# Patient Record
Sex: Male | Born: 1935
Health system: Southern US, Community
[De-identification: ages and names within clinical notes are randomized; demographics above are authoritative.]

## PROBLEM LIST (undated history)

## (undated) DIAGNOSIS — Z950 Presence of cardiac pacemaker: Secondary | ICD-10-CM

## (undated) DIAGNOSIS — I10 Essential (primary) hypertension: Secondary | ICD-10-CM

## (undated) DIAGNOSIS — D489 Neoplasm of uncertain behavior, unspecified: Secondary | ICD-10-CM

## (undated) DIAGNOSIS — E785 Hyperlipidemia, unspecified: Secondary | ICD-10-CM

## (undated) DIAGNOSIS — M1990 Unspecified osteoarthritis, unspecified site: Secondary | ICD-10-CM

## (undated) DIAGNOSIS — I4892 Unspecified atrial flutter: Principal | ICD-10-CM

## (undated) DIAGNOSIS — Z9289 Personal history of other medical treatment: Secondary | ICD-10-CM

## (undated) DIAGNOSIS — M353 Polymyalgia rheumatica: Secondary | ICD-10-CM

## (undated) DIAGNOSIS — I4891 Unspecified atrial fibrillation: Secondary | ICD-10-CM

## (undated) DIAGNOSIS — I5022 Chronic systolic (congestive) heart failure: Secondary | ICD-10-CM

## (undated) DIAGNOSIS — E039 Hypothyroidism, unspecified: Secondary | ICD-10-CM

## (undated) HISTORY — DX: Hyperlipidemia, unspecified: E78.5

## (undated) HISTORY — PX: CATARACT EXTRACTION, BILATERAL: SHX1313

## (undated) HISTORY — DX: Unspecified atrial fibrillation: I48.91

## (undated) HISTORY — DX: Hypothyroidism, unspecified: E03.9

## (undated) HISTORY — PX: INGUINAL HERNIA REPAIR: SUR1180

## (undated) HISTORY — PX: HAND SURGERY: SHX662

## (undated) HISTORY — DX: Personal history of other medical treatment: Z92.89

## (undated) HISTORY — PX: HEMORRHOID SURGERY: SHX153

## (undated) HISTORY — DX: Neoplasm of uncertain behavior, unspecified: D48.9

## (undated) HISTORY — PX: CHOLECYSTECTOMY: SHX55

## (undated) HISTORY — PX: TONSILLECTOMY: SUR1361

---

## 1999-12-08 ENCOUNTER — Ambulatory Visit: Admission: RE | Admit: 1999-12-08 | Discharge: 1999-12-08 | Payer: Self-pay | Admitting: Internal Medicine

## 2001-03-06 ENCOUNTER — Ambulatory Visit (HOSPITAL_COMMUNITY): Admission: RE | Admit: 2001-03-06 | Discharge: 2001-03-06 | Payer: Self-pay | Admitting: Cardiovascular Disease

## 2002-10-12 ENCOUNTER — Encounter: Payer: Self-pay | Admitting: Internal Medicine

## 2002-10-12 ENCOUNTER — Ambulatory Visit (HOSPITAL_COMMUNITY): Admission: RE | Admit: 2002-10-12 | Discharge: 2002-10-12 | Payer: Self-pay | Admitting: Internal Medicine

## 2004-03-16 ENCOUNTER — Ambulatory Visit: Payer: Self-pay | Admitting: Cardiology

## 2004-04-13 ENCOUNTER — Ambulatory Visit: Payer: Self-pay | Admitting: Cardiology

## 2004-05-13 ENCOUNTER — Ambulatory Visit: Payer: Self-pay | Admitting: *Deleted

## 2004-05-15 ENCOUNTER — Ambulatory Visit: Payer: Self-pay

## 2004-06-01 ENCOUNTER — Ambulatory Visit: Payer: Self-pay | Admitting: Internal Medicine

## 2004-06-01 ENCOUNTER — Ambulatory Visit: Payer: Self-pay

## 2004-06-02 ENCOUNTER — Ambulatory Visit: Payer: Self-pay | Admitting: Cardiology

## 2004-06-02 ENCOUNTER — Encounter: Payer: Self-pay | Admitting: Cardiology

## 2004-06-02 ENCOUNTER — Ambulatory Visit (HOSPITAL_COMMUNITY): Admission: RE | Admit: 2004-06-02 | Discharge: 2004-06-02 | Payer: Self-pay | Admitting: Cardiology

## 2004-06-09 ENCOUNTER — Ambulatory Visit: Payer: Self-pay | Admitting: Cardiology

## 2004-06-16 ENCOUNTER — Ambulatory Visit: Payer: Self-pay | Admitting: Internal Medicine

## 2004-07-07 ENCOUNTER — Ambulatory Visit: Payer: Self-pay | Admitting: Cardiovascular Disease

## 2004-08-04 ENCOUNTER — Ambulatory Visit: Payer: Self-pay | Admitting: Cardiology

## 2004-08-11 ENCOUNTER — Ambulatory Visit: Payer: Self-pay | Admitting: Internal Medicine

## 2004-08-20 ENCOUNTER — Ambulatory Visit: Payer: Self-pay | Admitting: Cardiology

## 2004-08-24 ENCOUNTER — Ambulatory Visit: Payer: Self-pay | Admitting: Internal Medicine

## 2004-08-28 ENCOUNTER — Ambulatory Visit: Payer: Self-pay | Admitting: Internal Medicine

## 2004-09-17 ENCOUNTER — Ambulatory Visit: Payer: Self-pay | Admitting: Cardiology

## 2004-10-08 ENCOUNTER — Ambulatory Visit: Payer: Self-pay | Admitting: Cardiology

## 2004-10-16 ENCOUNTER — Ambulatory Visit: Payer: Self-pay | Admitting: Cardiology

## 2004-10-29 ENCOUNTER — Ambulatory Visit: Payer: Self-pay | Admitting: Cardiology

## 2004-11-04 ENCOUNTER — Ambulatory Visit: Payer: Self-pay | Admitting: Internal Medicine

## 2004-11-13 ENCOUNTER — Ambulatory Visit: Payer: Self-pay | Admitting: Cardiology

## 2004-11-30 ENCOUNTER — Ambulatory Visit (HOSPITAL_COMMUNITY): Admission: RE | Admit: 2004-11-30 | Discharge: 2004-11-30 | Payer: Self-pay | Admitting: General Surgery

## 2005-01-12 ENCOUNTER — Ambulatory Visit: Payer: Self-pay | Admitting: Internal Medicine

## 2005-02-16 ENCOUNTER — Ambulatory Visit: Payer: Self-pay | Admitting: Internal Medicine

## 2005-02-26 ENCOUNTER — Ambulatory Visit: Payer: Self-pay | Admitting: Internal Medicine

## 2005-05-03 HISTORY — PX: ATRIAL FIBRILLATION ABLATION: SHX5732

## 2005-07-12 ENCOUNTER — Ambulatory Visit: Payer: Self-pay | Admitting: Internal Medicine

## 2006-01-06 ENCOUNTER — Ambulatory Visit: Payer: Self-pay | Admitting: Internal Medicine

## 2006-02-17 ENCOUNTER — Ambulatory Visit: Payer: Self-pay | Admitting: Internal Medicine

## 2006-07-06 ENCOUNTER — Ambulatory Visit: Payer: Self-pay | Admitting: Internal Medicine

## 2006-07-06 LAB — CONVERTED CEMR LAB
ALT: 9 units/L (ref 0–40)
LDL Cholesterol: 97 mg/dL (ref 0–99)
Potassium: 5.7 meq/L — ABNORMAL HIGH (ref 3.5–5.1)
TSH: 1.96 microintl units/mL (ref 0.35–5.50)
Total CHOL/HDL Ratio: 4.1
VLDL: 24 mg/dL (ref 0–40)

## 2006-07-19 ENCOUNTER — Ambulatory Visit: Payer: Self-pay | Admitting: Gastroenterology

## 2006-08-05 ENCOUNTER — Ambulatory Visit: Payer: Self-pay | Admitting: Gastroenterology

## 2006-08-05 LAB — HM COLONOSCOPY

## 2007-01-09 ENCOUNTER — Ambulatory Visit: Payer: Self-pay | Admitting: Internal Medicine

## 2007-01-09 LAB — CONVERTED CEMR LAB
BUN: 21 mg/dL (ref 6–23)
Calcium: 9.3 mg/dL (ref 8.4–10.5)
Creatinine, Ser: 1 mg/dL (ref 0.4–1.5)
GFR calc non Af Amer: 77 mL/min
Glucose, Bld: 92 mg/dL (ref 70–99)
Sodium: 144 meq/L (ref 135–145)

## 2007-01-13 ENCOUNTER — Encounter: Payer: Self-pay | Admitting: Internal Medicine

## 2007-01-13 DIAGNOSIS — Z9089 Acquired absence of other organs: Secondary | ICD-10-CM

## 2007-01-13 DIAGNOSIS — M353 Polymyalgia rheumatica: Secondary | ICD-10-CM

## 2007-01-13 DIAGNOSIS — E039 Hypothyroidism, unspecified: Secondary | ICD-10-CM

## 2007-01-13 DIAGNOSIS — Z9889 Other specified postprocedural states: Secondary | ICD-10-CM

## 2007-01-13 DIAGNOSIS — E785 Hyperlipidemia, unspecified: Secondary | ICD-10-CM | POA: Insufficient documentation

## 2007-01-13 HISTORY — DX: Polymyalgia rheumatica: M35.3

## 2007-02-24 ENCOUNTER — Encounter: Payer: Self-pay | Admitting: Internal Medicine

## 2007-04-05 ENCOUNTER — Ambulatory Visit: Payer: Self-pay | Admitting: Internal Medicine

## 2007-04-05 DIAGNOSIS — D485 Neoplasm of uncertain behavior of skin: Secondary | ICD-10-CM | POA: Insufficient documentation

## 2007-04-17 ENCOUNTER — Telehealth: Payer: Self-pay | Admitting: Internal Medicine

## 2007-07-10 ENCOUNTER — Ambulatory Visit: Payer: Self-pay | Admitting: Internal Medicine

## 2007-07-10 LAB — CONVERTED CEMR LAB
ALT: 10 units/L (ref 0–53)
Albumin: 4.5 g/dL (ref 3.5–5.2)
CO2: 32 meq/L (ref 19–32)
Creatinine, Ser: 1.1 mg/dL (ref 0.4–1.5)
GFR calc Af Amer: 83 mL/min
GFR calc non Af Amer: 69 mL/min
HDL: 44.4 mg/dL (ref >39.0)
Potassium: 5.3 meq/L — ABNORMAL HIGH (ref 3.5–5.1)
Sodium: 143 meq/L (ref 135–145)
TSH: 1.93 microintl units/mL (ref 0.35–5.50)
Total Bilirubin: 1.3 mg/dL — ABNORMAL HIGH (ref 0.3–1.2)
VLDL: 22 mg/dL (ref 0–40)

## 2007-07-11 ENCOUNTER — Encounter: Payer: Self-pay | Admitting: Internal Medicine

## 2008-01-03 ENCOUNTER — Ambulatory Visit: Payer: Self-pay | Admitting: Internal Medicine

## 2008-01-03 DIAGNOSIS — I480 Paroxysmal atrial fibrillation: Secondary | ICD-10-CM | POA: Insufficient documentation

## 2008-01-03 LAB — CONVERTED CEMR LAB
BUN: 17 mg/dL (ref 6–23)
Basophils Absolute: 0 10*3/uL (ref 0.0–0.1)
Basophils Relative: 0.5 % (ref 0.0–3.0)
Bilirubin, Direct: 0.2 mg/dL (ref 0.0–0.3)
Chloride: 108 meq/L (ref 96–112)
Free T4: 1 ng/dL (ref 0.6–1.6)
GFR calc Af Amer: 93 mL/min
HCT: 46.5 % (ref 39.0–52.0)
Hemoglobin: 16 g/dL (ref 13.0–17.0)
LDL Cholesterol: 116 mg/dL — ABNORMAL HIGH (ref 0–99)
Lymphocytes Relative: 13.3 % (ref 12.0–46.0)
MCHC: 34.5 g/dL (ref 30.0–36.0)
MCV: 91.6 fL (ref 78.0–100.0)
Monocytes Absolute: 0.5 10*3/uL (ref 0.1–1.0)
Monocytes Relative: 9.3 % (ref 3.0–12.0)
Platelets: 165 10*3/uL (ref 150–400)
Potassium: 5.2 meq/L — ABNORMAL HIGH (ref 3.5–5.1)
RBC: 5.07 M/uL (ref 4.22–5.81)
Total CHOL/HDL Ratio: 4.9
Total Protein: 7.8 g/dL (ref 6.0–8.3)
VLDL: 25 mg/dL (ref 0–40)
WBC: 5.7 10*3/uL (ref 4.5–10.5)

## 2008-01-05 ENCOUNTER — Encounter: Payer: Self-pay | Admitting: Internal Medicine

## 2008-02-08 ENCOUNTER — Ambulatory Visit: Payer: Self-pay | Admitting: Internal Medicine

## 2008-07-08 ENCOUNTER — Ambulatory Visit: Payer: Self-pay | Admitting: Internal Medicine

## 2008-07-09 ENCOUNTER — Ambulatory Visit: Payer: Self-pay | Admitting: Internal Medicine

## 2008-07-09 LAB — CONVERTED CEMR LAB
ALT: 9 units/L (ref 0–53)
AST: 19 units/L (ref 0–37)
Albumin: 4 g/dL (ref 3.5–5.2)
BUN: 21 mg/dL (ref 6–23)
Calcium: 9.2 mg/dL (ref 8.4–10.5)
Creatinine, Ser: 1 mg/dL (ref 0.4–1.5)
GFR calc non Af Amer: 76 mL/min
Glucose, Bld: 131 mg/dL — ABNORMAL HIGH (ref 70–99)
HDL: 36.2 mg/dL — ABNORMAL LOW (ref >39.0)
LDL Cholesterol: 86 mg/dL (ref 0–99)
Sodium: 140 meq/L (ref 135–145)
Total Bilirubin: 1.6 mg/dL — ABNORMAL HIGH (ref 0.3–1.2)
Total CHOL/HDL Ratio: 3.6
Triglycerides: 50 mg/dL (ref 0–149)

## 2008-07-13 ENCOUNTER — Encounter: Payer: Self-pay | Admitting: Internal Medicine

## 2008-07-19 ENCOUNTER — Ambulatory Visit: Payer: Self-pay | Admitting: Internal Medicine

## 2008-07-28 ENCOUNTER — Encounter: Payer: Self-pay | Admitting: Internal Medicine

## 2008-10-24 ENCOUNTER — Encounter: Payer: Self-pay | Admitting: Internal Medicine

## 2008-10-25 ENCOUNTER — Encounter: Payer: Self-pay | Admitting: Internal Medicine

## 2009-01-09 ENCOUNTER — Ambulatory Visit: Payer: Self-pay | Admitting: Internal Medicine

## 2009-01-09 LAB — CONVERTED CEMR LAB
ALT: 10 units/L (ref 0–53)
AST: 21 units/L (ref 0–37)
Alkaline Phosphatase: 56 units/L (ref 39–117)
Calcium: 9.1 mg/dL (ref 8.4–10.5)
Creatinine, Ser: 0.9 mg/dL (ref 0.4–1.5)
LDL Cholesterol: 89 mg/dL (ref 0–99)
Potassium: 5.3 meq/L — ABNORMAL HIGH (ref 3.5–5.1)
Sodium: 141 meq/L (ref 135–145)
TSH: 2.48 microintl units/mL (ref 0.35–5.50)
Total Bilirubin: 1.3 mg/dL — ABNORMAL HIGH (ref 0.3–1.2)

## 2009-03-12 ENCOUNTER — Ambulatory Visit: Payer: Self-pay | Admitting: Internal Medicine

## 2009-07-18 ENCOUNTER — Ambulatory Visit: Payer: Self-pay | Admitting: Internal Medicine

## 2009-07-22 ENCOUNTER — Ambulatory Visit: Payer: Self-pay | Admitting: Internal Medicine

## 2009-07-22 LAB — CONVERTED CEMR LAB
ALT: 11 units/L (ref 0–53)
Albumin: 4.1 g/dL (ref 3.5–5.2)
BUN: 20 mg/dL (ref 6–23)
Bilirubin, Direct: 0.3 mg/dL (ref 0.0–0.3)
CO2: 31 meq/L (ref 19–32)
Calcium: 9.3 mg/dL (ref 8.4–10.5)
Chloride: 108 meq/L (ref 96–112)
Cholesterol: 131 mg/dL (ref 0–200)
Creatinine, Ser: 1 mg/dL (ref 0.4–1.5)
LDL Cholesterol: 76 mg/dL (ref 0–99)
Potassium: 4.9 meq/L (ref 3.5–5.1)
Sodium: 141 meq/L (ref 135–145)
Total CHOL/HDL Ratio: 3
Total Protein: 7.1 g/dL (ref 6.0–8.3)
Triglycerides: 70 mg/dL (ref 0.0–149.0)
VLDL: 14 mg/dL (ref 0.0–40.0)

## 2009-08-21 ENCOUNTER — Ambulatory Visit: Payer: Self-pay | Admitting: Internal Medicine

## 2009-08-21 DIAGNOSIS — I10 Essential (primary) hypertension: Secondary | ICD-10-CM

## 2009-08-21 HISTORY — DX: Essential (primary) hypertension: I10

## 2010-01-16 ENCOUNTER — Encounter: Payer: Self-pay | Admitting: Internal Medicine

## 2010-01-16 ENCOUNTER — Ambulatory Visit: Payer: Self-pay | Admitting: Internal Medicine

## 2010-01-16 DIAGNOSIS — E291 Testicular hypofunction: Secondary | ICD-10-CM

## 2010-01-16 LAB — CONVERTED CEMR LAB
BUN: 27 mg/dL — ABNORMAL HIGH (ref 6–23)
CO2: 32 meq/L (ref 19–32)
Chloride: 104 meq/L (ref 96–112)
GFR calc non Af Amer: 73.49 mL/min (ref >60)
Potassium: 6.3 meq/L (ref 3.5–5.1)
Sodium: 142 meq/L (ref 135–145)

## 2010-01-20 ENCOUNTER — Telehealth: Payer: Self-pay | Admitting: Internal Medicine

## 2010-01-21 ENCOUNTER — Ambulatory Visit: Payer: Self-pay | Admitting: Internal Medicine

## 2010-01-26 ENCOUNTER — Telehealth (INDEPENDENT_AMBULATORY_CARE_PROVIDER_SITE_OTHER): Payer: Self-pay | Admitting: *Deleted

## 2010-01-28 ENCOUNTER — Encounter: Payer: Self-pay | Admitting: Internal Medicine

## 2010-01-30 ENCOUNTER — Ambulatory Visit: Payer: Self-pay | Admitting: Internal Medicine

## 2010-03-02 ENCOUNTER — Ambulatory Visit: Payer: Self-pay | Admitting: Internal Medicine

## 2010-03-06 ENCOUNTER — Telehealth: Payer: Self-pay | Admitting: Internal Medicine

## 2010-03-09 ENCOUNTER — Ambulatory Visit: Payer: Self-pay | Admitting: Internal Medicine

## 2010-04-08 ENCOUNTER — Ambulatory Visit: Payer: Self-pay | Admitting: Internal Medicine

## 2010-05-08 ENCOUNTER — Ambulatory Visit
Admission: RE | Admit: 2010-05-08 | Discharge: 2010-05-08 | Payer: Self-pay | Source: Home / Self Care | Attending: Internal Medicine | Admitting: Internal Medicine

## 2010-06-02 NOTE — Assessment & Plan Note (Signed)
Summary: TESTOSTERONE/MEN [name]   Nurse Visit   Allergies: No Known Drug Allergies  Medication Administration  Injection # 1:    Medication: Testosterone Cypionat 200mg  ing    Diagnosis: HYPOGONADISM (ICD-257.2)    Route: IM    Site: RUOQ gluteus    Exp Date: 10/31/2012    Lot #: [id number]    Patient tolerated injection without complications    Given by: Jarome Lamas (March 02, 2010 2:34 PM)  Orders Added: 1)  Admin of Therapeutic Inj  intramuscular or subcutaneous [96372] 2)  Testosterone Cypionat 200mg  ing [J1080]

## 2010-06-02 NOTE — Assessment & Plan Note (Signed)
Summary: 6 MTH FU  #--STC   Vital Signs:  Patient profile:   77 year old male Height:      69 inches Weight:      173 pounds BMI:     25.64 O2 Sat:      97 % on Room air Temp:     97.5 degrees F oral Pulse rate:   52 / minute BP sitting:   112 / 62  (left arm) Cuff size:   regular  Vitals Entered By: Bill Salinas CMA (July 18, 2009 3:28 PM)  O2 Flow:  Room air CC: 6 month follow up/ ab   Primary Care Provider:  Quindell Shere  CC:  6 month follow up/ ab.  History of Present Illness: Patient presents for routine follow-up: did have an intestinal virus early March: N/V/D. He treated himself with pediolyte. He remained sore for several weeks.   Cardiac -staying in rheumatology  Rheum - no flare of PMR  Current Medications (verified): 1)  Multivitamins   Tabs (Multiple Vitamin) .... Take One Tablet Daily 2)  Synthroid 50 Mcg  Tabs (Levothyroxine Sodium) .... Take One Tablet Daily 3)  Refresh Tears 0.5 %  Soln (Carboxymethylcellulose Sodium) .... Use 2-3 Drops At Bedtime 4)  Lipitor 10 Mg  Tabs (Atorvastatin Calcium) .... Take One Tablet Daily 5)  Enalapril Maleate 5 Mg  Tabs (Enalapril Maleate) .... Take One Tablet Daily 6)  Aspirin 325 Mg  Tabs (Aspirin) .... Take One Tablet Daily 7)  Glucosamine-Chondroitin-Msm [phone] Mg  Tabs (Glucosa-Chondr-Na Chondr-Msm) .... Take One Tablet Daily 8)  Viagra 100 Mg  Tabs (Sildenafil Citrate) .... 1/2 Tab As Needed  Allergies (verified): No Known Drug Allergies  Past History:  Past Medical History: Last updated: 01/03/2008 ATRIAL FIBRILLATION, HX OF (ICD-V12.59)-s/p ablation at [facility] clinic '07 NEOPLASM OF UNCERTAIN BEHAVIOR OF SKIN (ICD-238.2) Hx of POLYMYALGIA RHEUMATICA (ICD-725) HYPERLIPIDEMIA (ICD-272.4) HYPOTHYROIDISM (ICD-244.9)  Past Surgical History: Last updated: 01/03/2008 HAND SURGERY (ICD-763.1) TONSILLECTOMY, HX OF (ICD-V45.79) INGUINAL HERNIORRHAPHY, HX OF (ICD-V45.89) CHOLECYSTECTOMY, HX OF  (ICD-V45.79) Cataract extraction bilateral Summer '09  Family History: Last updated: 2007/07/22 father - deceased @ 61: Renal failure mother - deceased @ 92:emphysema, DM Neg-colon, prostate cancer; no first degree with CAD  Social History: Last updated: 07/22/2007 Lennie Hummer, BA $ MBA, Graduate degree [employer]ing [employer] married - 1958 2 girls - '59, '62; 1 son - '64; 2 grandchildren Retired Psychologist, occupational - still active with stocks End- of-life CAre: has a living will, would not want any heroic/futile care.  Risk Factors: Alcohol Use: 0 (07/22/2007) Exercise: yes (2007-07-22)  Risk Factors: Smoking Status: quit (01/13/2007)  Review of Systems  The patient denies anorexia, fever, weight loss, weight gain, chest pain, syncope, dyspnea on exertion, peripheral edema, abdominal pain, severe indigestion/heartburn, muscle weakness, suspicious skin lesions, depression, and enlarged lymph nodes.    Physical Exam  General:  WNWD white male in no distress Head:  normocephalic and atraumatic.   Eyes:  vision grossly intact, pupils equal, pupils round, corneas and lenses clear, and no injection.   Mouth:  good dentition and pharynx pink and moist.   Neck:  supple, full ROM, no thyromegaly, and no carotid bruits.   Lungs:  Normal respiratory effort, chest expands symmetrically. Lungs are clear to auscultation, no crackles or wheezes. Heart:  Normal rate and regular rhythm. S1 and S2 normal without gallop, murmur, click, rub or other extra sounds. Abdomen:  soft, non-tender, and normal bowel sounds.   Msk:  normal ROM, no joint tenderness,  no joint swelling, no joint warmth, and no joint deformities.   Pulses:  2+ radial Extremities:  No clubbing, cyanosis, edema, or deformity noted with normal full range of motion of all joints.   Neurologic:  alert & oriented X3, cranial nerves II-XII intact, strength normal in all extremities, and gait normal.   Skin:  turgor normal and color normal.    Cervical Nodes:  no anterior cervical adenopathy and no posterior cervical adenopathy.   Psych:  Oriented X3, memory intact for recent and remote, normally interactive, good eye contact, and not anxious appearing.     Impression & Recommendations:  Problem # 1:  ATRIAL FIBRILLATION, HX OF (ICD-V12.59) Holding sinus rhythm after ablation.  Problem # 2:  Hx of POLYMYALGIA RHEUMATICA (ICD-725) Remains in remission  Problem # 3:  HYPERLIPIDEMIA (ICD-272.4) Due for labs - he will return for fasting specimen. Recommendations to follow.  His updated medication list for this problem includes:    Lipitor 10 Mg Tabs (Atorvastatin calcium) .Marland Kitchen... Take one tablet daily  Addendum: lab results are excellent with LDL 76  Problem # 4:  HYPOTHYROIDISM (ICD-244.9) For follow-up lab with recommendations to follow.  His updated medication list for this problem includes:    Synthroid 50 Mcg Tabs (Levothyroxine sodium) .Marland Kitchen... Take one tablet daily  Addendum - good control   Problem # 5:  HYPOGONADISM (ICD-257.2) Patient recently seen by urology and had a testosterone level drawn. By his recollection it was 180 - a low value. Discussed hypogonadism and male wellness. He will obtain the lab result and if he is deficient we can discuss further replacement therapy.  Complete Medication List: 1)  Multivitamins Tabs (Multiple vitamin) .... Take one tablet daily 2)  Synthroid 50 Mcg Tabs (Levothyroxine sodium) .... Take one tablet daily 3)  Refresh Tears 0.5 % Soln (Carboxymethylcellulose sodium) .... Use 2-3 drops at bedtime 4)  Lipitor 10 Mg Tabs (Atorvastatin calcium) .... Take one tablet daily 5)  Enalapril Maleate 5 Mg Tabs (Enalapril maleate) .... Take one tablet daily 6)  Aspirin 325 Mg Tabs (Aspirin) .... Take one tablet daily 7)  Glucosamine-chondroitin-msm [phone] Mg Tabs (Glucosa-chondr-na chondr-msm) .... Take one tablet daily 8)  Viagra 100 Mg Tabs (Sildenafil citrate) .... 1/2 tab as  needed   Patient: Jobanny Leas Note: All result statuses are Final unless otherwise noted.  Tests: (1) Lipid Panel (LIPID)   Cholesterol               131 mg/dL                   9-811     ATP III Classification            Desirable:  < 200 mg/dL                    Borderline High:  200 - 239 mg/dL               High:  > = 240 mg/dL   Triglycerides             70.0 mg/dL                  9.1-478.2     Normal:  <150 mg/dL     Borderline High:  956 - 199 mg/dL   HDL                       21.30 mg/dL                 >  39.00   VLDL Cholesterol          14.0 mg/dL                  1.6-10.9   LDL Cholesterol           76 mg/dL                    6-04  CHO/HDL Ratio:  CHD Risk                             3                    Men          Women     1/2 Average Risk     3.4          3.3     Average Risk          5.0          4.4     2X Average Risk          9.6          7.1     3X Average Risk          15.0          11.0                           Tests: (2) Hepatic/Liver Function Panel (HEPATIC)   Total Bilirubin           1.2 mg/dL                   5.4-0.9   Direct Bilirubin          0.3 mg/dL                   8.1-1.9   Alkaline Phosphatase      58 U/L                      39-117   AST                       19 U/L                      0-37   ALT                       11 U/L                      0-53   Total Protein             7.1 g/dL                    1.4-7.8   Albumin                   4.1 g/dL                    2.9-5.6  Tests: (3) TSH (TSH)   FastTSH                   2.59 uIU/mL                 0.35-5.50  Tests: (4) BMP (METABOL)  Sodium                    141 mEq/L                   135-145   Potassium                 4.9 mEq/L                   3.5-5.1   Chloride                  108 mEq/L                   96-112   Carbon Dioxide            31 mEq/L                    19-32   Glucose                   94 mg/dL                    84-69   BUN                       20  mg/dL                    6-29   Creatinine                1.0 mg/dL                   5.2-8.4   Calcium                   9.3 mg/dL                   1.3-24.4   GFR                       76.13 mL/min                >60

## 2010-06-02 NOTE — Assessment & Plan Note (Signed)
Summary: testosterone inj-lb   Nurse Visit   Allergies: No Known Drug Allergies  Medication Administration  Injection # 1:    Medication: Testosterone Cypionat 200mg  ing    Diagnosis: HYPOGONADISM (ICD-257.2)    Route: IM    Site: LUOQ gluteus    Exp Date: 10/31/2012    Lot #: [id number]    Patient tolerated injection without complications    Given by: Lamar Sprinkles, CMA (January 30, 2010 11:31 AM)  Orders Added: 1)  Admin of patients own med IM/SQ [phone]

## 2010-06-02 NOTE — Medication Information (Signed)
Summary: Prior autho & approved for Depo-Testosterone/[employer]  Prior autho & approved for Depo-Testosterone/[employer]   Imported By: Sherian Rein 01/30/2010 15:10:16  _____________________________________________________________________  External Attachment:    Type:   Image     Comment:   External Document

## 2010-06-02 NOTE — Progress Notes (Signed)
Summary: PA-Testosterone  Phone Note Call from Patient Call back at Home Phone [phone]   Summary of Call: Patient is requesting a call back regarding rx given at last office visit.  Initial call taken by: Lamar Sprinkles, CMA,  January 20, 2010 3:58 PM  Follow-up for Phone Call        MD spoke w/pt, it was regarding PA for testosterone. Follow-up by: Lamar Sprinkles, CMA,  January 21, 2010 2:13 PM  Additional Follow-up for Phone Call Additional follow up Details #1::        PA-Testosterone called [employer], awaiting form. Dagoberto Reef  January 21, 2010 3:41 PM  Form pending completion - They need documentation of low testosterone levels..............Marland KitchenLamar Sprinkles, CMA  January 21, 2010 3:49 PM   if form is on my work area will do in AM Additional Follow-up by: Jacques Navy MD,  January 21, 2010 5:51 PM    Additional Follow-up for Phone Call Additional follow up Details #2::    done 9/25 Follow-up by: Jacques Navy MD,  January 26, 2010 8:30 AM

## 2010-06-02 NOTE — Progress Notes (Signed)
Summary: TESTOSTERONE  Phone Note Call from Patient Call back at Home Phone [phone]   Summary of Call: Pt was given testosterone injection on Monday. He was aware that a student administered the medication and was ok. He is concerned now that he did not get the entire dose or the medication "did not get in". Pt usually has more energy after the injection and says he has not felt a difference. Should he get another injection sooner?  Initial call taken by: Lamar Sprinkles, CMA,  March 06, 2010 9:35 AM  Follow-up for Phone Call        OK to come in for repeat injection  Follow-up by: Jacques Navy MD,  March 06, 2010 5:48 PM  Additional Follow-up for Phone Call Additional follow up Details #1::        Pt informed, scheduled for nurse visit today Additional Follow-up by: Lamar Sprinkles, CMA,  March 09, 2010 11:18 AM

## 2010-06-02 NOTE — Assessment & Plan Note (Signed)
Summary: PER PT 6 MTH FU  STC   Vital Signs:  Patient profile:   77 year old male Height:      69 inches Weight:      176 pounds BMI:     26.08 O2 Sat:      96 % on Room air Temp:     96.7 degrees F oral Pulse rate:   51 / minute BP sitting:   120 / 70  (left arm) Cuff size:   regular  Vitals Entered By: Bill Salinas CMA (January 16, 2010 10:21 AM)  O2 Flow:  Room air CC: 6 month follow up/ ab   Primary Care Provider:  Norins  CC:  6 month follow up/ ab.  History of Present Illness: Had a bad cold in May - moved to his chest and had wheezing. He did recover on his own.   He has been thinking about testosterone replacement and is willing to have replacement.  He has otherwise been doing well.   Preventive Screening-Counseling & Management  Alcohol-Tobacco     Smoking Status: quit     Year Quit: 1988  Current Medications (verified): 1)  Multivitamins   Tabs (Multiple Vitamin) .... Take One Tablet Daily 2)  Synthroid 50 Mcg  Tabs (Levothyroxine Sodium) .... Take One Tablet Daily 3)  Refresh Tears 0.5 %  Soln (Carboxymethylcellulose Sodium) .... Use 2-3 Drops At Bedtime 4)  Lipitor 10 Mg  Tabs (Atorvastatin Calcium) .... Take One Tablet Daily 5)  Enalapril Maleate 5 Mg  Tabs (Enalapril Maleate) .... Take One Tablet Daily 6)  Aspirin 325 Mg  Tabs (Aspirin) .... Take One Tablet Daily 7)  Glucosamine-Chondroitin-Msm [phone] Mg  Tabs (Glucosa-Chondr-Na Chondr-Msm) .... Take One Tablet Daily 8)  Viagra 100 Mg  Tabs (Sildenafil Citrate) .... 1/2 Tab As Needed  Allergies (verified): No Known Drug Allergies  Past History:  Past Medical History: Last updated: 01/03/2008 ATRIAL FIBRILLATION, HX OF (ICD-V12.59)-s/p ablation at [facility] clinic '07 NEOPLASM OF UNCERTAIN BEHAVIOR OF SKIN (ICD-238.2) Hx of POLYMYALGIA RHEUMATICA (ICD-725) HYPERLIPIDEMIA (ICD-272.4) HYPOTHYROIDISM (ICD-244.9)  Past Surgical History: Last updated: 01/03/2008 HAND SURGERY  (ICD-763.1) TONSILLECTOMY, HX OF (ICD-V45.79) INGUINAL HERNIORRHAPHY, HX OF (ICD-V45.89) CHOLECYSTECTOMY, HX OF (ICD-V45.79) Cataract extraction bilateral Summer '09 FH reviewed for relevance  Social History: Lennie Hummer, BA $ MBA, Graduate degree [employer] married - 1958 2 girls - '59, '62; 1 son - '64; 2 grandchildren Retired Psychologist, occupational - still active with stocks End- of-life [employer]re: has a living will, would not want any heroic/futile care. Wife sustained fracture proximal Humerus left -Sept '11  Review of Systems  The patient denies anorexia, fever, weight loss, weight gain, decreased hearing, chest pain, syncope, dyspnea on exertion, prolonged cough, abdominal pain, severe indigestion/heartburn, incontinence, suspicious skin lesions, difficulty walking, depression, and enlarged lymph nodes.    Physical Exam  General:  WNWD white male in no distress Head:  normocephalic and atraumatic.   Eyes:  pupils equal and pupils round.   Neck:  supple, full ROM, no thyromegaly, and no carotid bruits.   Chest Wall:  no deformities.   Lungs:  normal respiratory effort, no intercostal retractions, no accessory muscle use, normal breath sounds, no crackles, and no wheezes.   Heart:  normal rate, regular rhythm, no JVD, and no HJR.   Abdomen:  soft and non-tender.   Msk:  normal ROM, no joint tenderness, no joint swelling, no joint warmth, and no joint instability.   Pulses:  2+ radial pulse Extremities:  No clubbing,  cyanosis, edema, or deformity noted with normal full range of motion of all joints.   Neurologic:  alert & oriented X3, cranial nerves II-XII intact, strength normal in all extremities, and gait normal.   Skin:  turgor normal and color normal.   Cervical Nodes:  no anterior cervical adenopathy and no posterior cervical adenopathy.   Psych:  Oriented X3, memory intact for recent and remote, normally interactive, and good eye contact.     Impression &  Recommendations:  Problem # 1:  HYPERTENSION, BENIGN (ICD-401.1)  His updated medication list for this problem includes:    Enalapril Maleate 5 Mg Tabs (Enalapril maleate) .Marland Kitchen... Take one tablet daily  Orders: TLB-BMP (Basic Metabolic Panel-BMET) (80048-METABOL)  BP today: 120/70 Prior BP: 140/78 (08/21/2009)  Good control  Plan - routine metabolic panel  Labs Reviewed: K+: 4.9 (07/22/2009) Creat: : 1.0 (07/22/2009)   Chol: 131 (07/22/2009)   HDL: 40.60 (07/22/2009)   LDL: 76 (07/22/2009)   TG: 70.0 (07/22/2009)  Problem # 2:  ATRIAL FIBRILLATION, HX OF (ICD-V12.59)  Patient stable. Last saw Dr. Graciela Husbands in April. On exam today he had frequent premature beats vs slow a. fib. EKG confirms PACs, bradycardia without a. fib.   Orders: EKG w/ Interpretation (93000)  Problem # 3:  HYPERLIPIDEMIA (ICD-272.4)  His updated medication list for this problem includes:    Lipitor 10 Mg Tabs (Atorvastatin calcium) .Marland Kitchen... Take one tablet daily  Labs Reviewed: SGOT: 19 (07/22/2009)   SGPT: 11 (07/22/2009)   HDL:40.60 (07/22/2009), 39.60 (01/09/2009)  LDL:76 (07/22/2009), 89 (01/09/2009)  Chol:131 (07/22/2009), 140 (01/09/2009)  Trig:70.0 (07/22/2009), 57.0 (01/09/2009)  March labs with good control.  Plan - continue present medications.   Problem # 4:  HYPOTHYROIDISM (ICD-244.9)  His updated medication list for this problem includes:    Synthroid 50 Mcg Tabs (Levothyroxine sodium) .Marland Kitchen... Take one tablet daily  Labs Reviewed: TSH: 2.59 (07/22/2009)    HgBA1c: 5.6 (07/19/2008)  March labs revealed good control.   Plan follow-up lab March '12  Problem # 5:  HYPOGONADISM (ICD-257.2) Patient provided corrected last testosterone level of 212.   Plan - testosterone cypionate 400mg  IM monthly. Rx provided and he will return for injection at his convenience.   Complete Medication List: 1)  Multivitamins Tabs (Multiple vitamin) .... Take one tablet daily 2)  Synthroid 50 Mcg Tabs  (Levothyroxine sodium) .... Take one tablet daily 3)  Refresh Tears 0.5 % Soln (Carboxymethylcellulose sodium) .... Use 2-3 drops at bedtime 4)  Lipitor 10 Mg Tabs (Atorvastatin calcium) .... Take one tablet daily 5)  Enalapril Maleate 5 Mg Tabs (Enalapril maleate) .... Take one tablet daily 6)  Aspirin 325 Mg Tabs (Aspirin) .... Take one tablet daily 7)  Glucosamine-chondroitin-msm [phone] Mg Tabs (Glucosa-chondr-na chondr-msm) .... Take one tablet daily 8)  Viagra 100 Mg Tabs (Sildenafil citrate) .... 1/2 tab as needed 9)  Testosterone Cypionate 200 Mg/ml Oil (Testosterone cypionate) .... 2 ml im monthly for testosterone deficiency  Other Orders: Tdap => 65yrs IM (98119) Admin 1st Vaccine (14782) Flu Vaccine 80yrs + MEDICARE PATIENTS (N5621) Administration Flu vaccine - MCR (H0865) Prescriptions: TESTOSTERONE CYPIONATE 200 MG/ML OIL (TESTOSTERONE CYPIONATE) 2 ml IM monthly for testosterone deficiency  #10cc x 3   Entered and Authorized by:   Jacques Navy MD   Signed by:   Jacques Navy MD on 01/16/2010   Method used:   Print then Give to Patient   RxID:   [id number]    Immunizations Administered:  Tetanus Vaccine:  Vaccine Type: Tdap    Site: right deltoid    Mfr: GlaxoSmithKline    Dose: 0.5 ml    Route: IM    Given by: Ami Bullins CMA    Exp. Date: 01/22/2012    Lot #: [id number]    VIS given: 03/20/08 version given January 16, 2010.     Flu Vaccine Consent Questions     Do you have a history of severe allergic reactions to this vaccine? no    Any prior history of allergic reactions to egg and/or gelatin? no    Do you have a sensitivity to the preservative Thimersol? no    Do you have a past history of Guillan-Barre Syndrome? no    Do you currently have an acute febrile illness? no    Have you ever had a severe reaction to latex? no    Vaccine information given and explained to patient? yes    Are you currently pregnant? no    Lot  Number:[id number]   Exp Date:10/31/2010   Site Given  Left Deltoid IMlu

## 2010-06-02 NOTE — Assessment & Plan Note (Signed)
Summary: ec6/last seen in 06/jml      Allergies Added: NKDA  Primary Provider:  Norins  CC:  ec6/last seen in 2006.  Marland Kitchen  History of Present Illness: Mr. Lafontaine comes in on his own today after a hiatus of 5 years.  we had seen him in the remote past because of atrial arrhythmias. He underwent catheter ablation at the [facility] by Dr. Levin Erp he was a pulmonary vein isolation and SVC isolation. He had recurrent atrial flutter following the procedure. We undertook cardioversion here. He has had no recurrent arrhythmias.  He was at a local health fair at his church. The nurse there commented on a pulse in his heartbeat. He comes because of that. He is noted to have a slow heart rate. He notes no impairment in exercise tolerance. There is no lightheadedness syncope or palpitations. Furthermore he denies chest pain.  Current Medications (verified): 1)  Multivitamins   Tabs (Multiple Vitamin) .... Take One Tablet Daily 2)  Synthroid 50 Mcg  Tabs (Levothyroxine Sodium) .... Take One Tablet Daily 3)  Refresh Tears 0.5 %  Soln (Carboxymethylcellulose Sodium) .... Use 2-3 Drops At Bedtime 4)  Lipitor 10 Mg  Tabs (Atorvastatin Calcium) .... Take One Tablet Daily 5)  Enalapril Maleate 5 Mg  Tabs (Enalapril Maleate) .... Take One Tablet Daily 6)  Aspirin 325 Mg  Tabs (Aspirin) .... Take One Tablet Daily 7)  Glucosamine-Chondroitin-Msm [id number] Mg  Tabs (Glucosa-Chondr-Na Chondr-Msm) .... Take One Tablet Daily 8)  Viagra 100 Mg  Tabs (Sildenafil Citrate) .... 1/2 Tab As Needed  Allergies (verified): No Known Drug Allergies  Past History:  Past Medical History: Last updated: 01/03/2008 ATRIAL FIBRILLATION, HX OF (ICD-V12.59)-s/p ablation at [facility] clinic '07 NEOPLASM OF UNCERTAIN BEHAVIOR OF SKIN (ICD-238.2) Hx of POLYMYALGIA RHEUMATICA (ICD-725) HYPERLIPIDEMIA (ICD-272.4) HYPOTHYROIDISM (ICD-244.9)  Past Surgical History: Last updated: 01/03/2008 HAND SURGERY  (ICD-763.1) TONSILLECTOMY, HX OF (ICD-V45.79) INGUINAL HERNIORRHAPHY, HX OF (ICD-V45.89) CHOLECYSTECTOMY, HX OF (ICD-V45.79) Cataract extraction bilateral Summer '09  Family History: Last updated: 07/11/2007 father - deceased @ 48: Renal failure mother - deceased @ 92:emphysema, DM Neg-colon, prostate cancer; no first degree with CAD  Social History: Last updated: July 11, 2007 Lennie Hummer, BA $ MBA, Graduate degree [employer]ing [employer] married - 1958 2 girls - '59, '62; 1 son - '64; 2 grandchildren Retired Psychologist, occupational - still active with stocks End- of-life CAre: has a living will, would not want any heroic/futile care.  Vital Signs:  Patient profile:   77 year old male Height:      69 inches Weight:      174 pounds BMI:     25.79 Pulse rate:   49 / minute Pulse rhythm:   regular BP sitting:   140 / 78  (left arm) Cuff size:   regular  Vitals Entered By: Judithe Modest CMA (August 21, 2009 2:59 PM)  Physical Exam  General:  Alert and oriented elderly Caucasian male appearing his stated age of 67  in no acute distress. HEENT  normal . Neck veins were flat; carotids brisk and full without bruits. No lymphadenopathy. Back without kyphosis. Lungs clear. Heart sounds regular without murmurs or gallops. PMI nondisplaced. Abdomen soft with active bowel sounds without midline pulsation or hepatomegaly. Femoral pulses and distal pulses intact. Extremities were without clubbing cyanosis or edemaSkin warm and dry. Neurological exam grossly normal affect is engaging   EKG  Procedure date:  08/21/2009  Findings:      sinus rhythm at 49 Intervals 0.15/0.10 6.44 Axis is  8 Otherwise normal  Impression & Recommendations:  Problem # 1:  SINOATRIAL NODE DYSFUNCTION-PAUSES (ICD-427.81) the patient notes that he has had pauses recorded by the nurse. He has had no symptomatic pauses. As he had no symptoms related either to the heartbeat or cerebral perfusion, I suggest that we need to do  nothing about this at all  In addition he has sinus bradycardia with heart rate of 49. Again he has no impairment of exercise tolerance. He tells me he does 51 pushups and 51 seconds daily. (Where did  that number come from)so again I don't think any intervention is indicated His updated medication list for this problem includes:    Enalapril Maleate 5 Mg Tabs (Enalapril maleate) .Marland Kitchen... Take one tablet daily    Aspirin 325 Mg Tabs (Aspirin) .Marland Kitchen... Take one tablet daily  Problem # 2:  HYPERTENSION, BENIGN (ICD-401.1) His blood pressure is borderline elevated. I suggest that he follow up with Dr. Debby Bud about this and bring with him some record of blood pressures to see if up titration of his enalapril would be appropriate to consider His updated medication list for this problem includes:    Enalapril Maleate 5 Mg Tabs (Enalapril maleate) .Marland Kitchen... Take one tablet daily    Aspirin 325 Mg Tabs (Aspirin) .Marland Kitchen... Take one tablet daily  Other Orders: EKG w/ Interpretation (93000)  Patient Instructions: 1)  Your physician recommends that you schedule a follow-up appointment in: as needed

## 2010-06-02 NOTE — Miscellaneous (Signed)
Summary: [facility], [facility]   Imported By: Lester Valhalla 07/25/2009 09:13:25  _____________________________________________________________________  External Attachment:    Type:   Image     Comment:   External Document

## 2010-06-02 NOTE — Assessment & Plan Note (Signed)
Summary: testosterone injection-lb   Nurse Visit   Allergies: No Known Drug Allergies  Medication Administration  Injection # 1:    Medication: Testosterone Cypionat 200mg  ing    Diagnosis: HYPOGONADISM (ICD-257.2)    Route: IM    Site: LUOQ gluteus    Exp Date: 10/31/2012    Lot #: [id number]    Mfr: Claudette Laws    Patient tolerated injection without complications    Given by: Lamar Sprinkles, CMA (April 08, 2010 2:40 PM)  Orders Added: 1)  Admin of patients own med IM/SQ [phone]

## 2010-06-02 NOTE — Progress Notes (Signed)
Summary: PA-Testosterone  Phone Note From Pharmacy   Summary of Call: PA-Testosterone faxed [employer] @ [phone], awaiting approval. [name]  January 26, 2010 3:47 PM\par  PA approved 01/27/10-01/31/11, pt aware. Initial call taken by: [name],  January 28, 2010 3:04 PM

## 2010-06-02 NOTE — Assessment & Plan Note (Signed)
Summary: testosterone/SD   Nurse Visit   Allergies: No Known Drug Allergies  Medication Administration  Injection # 1:    Medication: Testosterone Cypionat 200mg  ing    Diagnosis: HYPOGONADISM (ICD-257.2)    Route: IM    Site: RUOQ gluteus    Exp Date: 10/31/2012    Lot #: [id number]    Comments: 2cc injection    Patient tolerated injection without complications    Given by: Ami Bullins CMA (March 09, 2010 2:06 PM)  Orders Added: 1)  Testosterone Cypionat 200mg  ing [J1080]

## 2010-06-03 ENCOUNTER — Ambulatory Visit: Admit: 2010-06-03 | Payer: Self-pay | Admitting: Internal Medicine

## 2010-06-03 ENCOUNTER — Ambulatory Visit (INDEPENDENT_AMBULATORY_CARE_PROVIDER_SITE_OTHER): Payer: Medicare Other

## 2010-06-03 ENCOUNTER — Encounter: Payer: Self-pay | Admitting: Internal Medicine

## 2010-06-03 DIAGNOSIS — E291 Testicular hypofunction: Secondary | ICD-10-CM

## 2010-06-04 NOTE — Assessment & Plan Note (Signed)
Summary: TESTOSTERONE / MEN  /NWS   Nurse Visit   Allergies: No Known Drug Allergies  Medication Administration  Injection # 1:    Medication: Testosterone Cypionat 200mg  ing    Diagnosis: HYPOGONADISM (ICD-257.2)    Route: IM    Site: LUOQ gluteus    Exp Date: 10/31/2012    Lot #: [id number]    Mfr: [employer]    Patient tolerated injection without complications    Given by: Lanier Prude, CMA(AAMA) (May 08, 2010 3:15 PM)  Orders Added: 1)  Testosterone Cypionat 200mg  ing [J1080] 2)  Admin of patients own med IM/SQ [[id number]]

## 2010-06-10 NOTE — Assessment & Plan Note (Signed)
Summary: Testosterone   Nurse Visit   Allergies: No Known Drug Allergies  Medication Administration  Injection # 1:    Medication: Testosterone Cypionat 200mg  ing    Diagnosis: HYPOGONADISM (ICD-257.2)    Route: IM    Site: RUOQ gluteus    Exp Date: 10/2012    Lot #: [id number]    Patient tolerated injection without complications    Given by: Lamar Sprinkles, CMA (June 05, 2010 10:26 AM)  Orders Added: 1)  Admin of patients own med IM/SQ [phone]

## 2010-06-11 DIAGNOSIS — Z0279 Encounter for issue of other medical certificate: Secondary | ICD-10-CM

## 2010-06-15 ENCOUNTER — Telehealth: Payer: Self-pay | Admitting: Internal Medicine

## 2010-06-24 NOTE — Progress Notes (Signed)
  Phone Note Refill Request Message from:  Fax from Pharmacy on June 15, 2010 4:40 PM  Refills Requested: Medication #1:  LIPITOR 10 MG  TABS take one tablet daily  Medication #2:  SYNTHROID 50 MCG  TABS take one tablet daily  Medication #3:  ENALAPRIL MALEATE 5 MG  TABS take one tablet daily Initial call taken by: Ami Bullins CMA,  June 15, 2010 4:40 PM    Prescriptions: ENALAPRIL MALEATE 5 MG  TABS (ENALAPRIL MALEATE) take one tablet daily  #30 Tablet x 2   Entered by:   Ami Bullins CMA   Authorized by:   Jacques Navy MD   Signed by:   Bill Salinas CMA on 06/15/2010   Method used:   Faxed to ...       [facility] (mail-order)       [address]       [place], [place]  [address]       Ph: [phone]       Fax: [phone]   RxID:   [id number] LIPITOR 10 MG  TABS (ATORVASTATIN CALCIUM) take one tablet daily  #30 Tablet x 2   Entered by:   Bill Salinas CMA   Authorized by:   Jacques Navy MD   Signed by:   Bill Salinas CMA on 06/15/2010   Method used:   Faxed to ...       [facility] (mail-order)       [address]       [place], [place]  [address]       Ph: [phone]       Fax: [phone]   RxID:   [id number] SYNTHROID 50 MCG  TABS (LEVOTHYROXINE SODIUM) take one tablet daily  #30 Tablet x 2   Entered by:   Bill Salinas CMA   Authorized by:   Jacques Navy MD   Signed by:   Bill Salinas CMA on 06/15/2010   Method used:   Faxed to ...       [address] [facility] (mail-order)       [address]       [place], [place]  [address]       Ph: [phone]       Fax: [phone]   RxID:   [id number]

## 2010-07-01 ENCOUNTER — Encounter: Payer: Self-pay | Admitting: Internal Medicine

## 2010-07-01 ENCOUNTER — Telehealth: Payer: Self-pay | Admitting: Internal Medicine

## 2010-07-01 ENCOUNTER — Ambulatory Visit (INDEPENDENT_AMBULATORY_CARE_PROVIDER_SITE_OTHER): Payer: Medicare Other

## 2010-07-01 DIAGNOSIS — E291 Testicular hypofunction: Secondary | ICD-10-CM

## 2010-07-09 NOTE — Progress Notes (Signed)
Summary: Cialis  Phone Note Call from Patient Call back at Home Phone [phone]   Caller: Patient Summary of Call: pt came in for testos inj. While here he req to be given sample or trial of Cialis. He states the viagra makes him feel "not quite right". I asked him specifically how it made him feel. He said  it didnt make him feel bad just not normal.  he says he has tried Cialis samples he was given by his wife's urologist and it worked without the side effects Viagra caused. Please advise Initial call taken by: Lanier Prude, [facility]),  July 01, 2010 2:16 PM  Follow-up for Phone Call        ok for cialis 10mg  # 6, refill prn Follow-up by: Jacques Navy MD,  July 01, 2010 5:22 PM  Additional Follow-up for Phone Call Additional follow up Details #1::        Pt informed  Additional Follow-up by: Lamar Sprinkles, CMA,  July 01, 2010 6:25 PM    New/Updated Medications: CIALIS 10 MG TABS (TADALAFIL)  CIALIS 10 MG TABS (TADALAFIL) 1 once daily as needed as directed Prescriptions: CIALIS 10 MG TABS (TADALAFIL) 1 once daily as needed as directed  #6 x 12   Entered by:   Lamar Sprinkles, CMA   Authorized by:   Jacques Navy MD   Signed by:   Lamar Sprinkles, CMA on 07/01/2010   Method used:   Electronically to        [facility] [address] [phone]* (retail)       [address]       [place], [place]  [address]       Ph: [phone] or [phone]       Fax: [phone]   RxID:   [id number]

## 2010-07-09 NOTE — Assessment & Plan Note (Signed)
Summary: per pt test inj  men  stc   Nurse Visit   Allergies: No Known Drug Allergies  Medication Administration  Injection # 1:    Medication: Testosterone Cypionat 200mg  ing    Diagnosis: HYPOGONADISM (ICD-257.2)    Route: IM    Site: RUOQ gluteus    Exp Date: 01/31/2013    Lot #: [id number]    Mfr: [employer]ma [employer]    Patient tolerated injection without complications    Given by: Lanier Prude, CMA(AAMA) (July 01, 2010 2:13 PM)  Orders Added: 1)  Testosterone Cypionat 200mg  ing [J1080] 2)  Admin of patients own med IM/SQ [[id number]]

## 2010-07-15 ENCOUNTER — Encounter: Payer: Self-pay | Admitting: Internal Medicine

## 2010-07-15 ENCOUNTER — Ambulatory Visit (INDEPENDENT_AMBULATORY_CARE_PROVIDER_SITE_OTHER): Payer: Medicare Other | Admitting: Internal Medicine

## 2010-07-15 ENCOUNTER — Other Ambulatory Visit: Payer: Self-pay | Admitting: Internal Medicine

## 2010-07-15 ENCOUNTER — Other Ambulatory Visit: Payer: Medicare Other

## 2010-07-15 DIAGNOSIS — E785 Hyperlipidemia, unspecified: Secondary | ICD-10-CM

## 2010-07-15 DIAGNOSIS — E039 Hypothyroidism, unspecified: Secondary | ICD-10-CM

## 2010-07-15 DIAGNOSIS — I10 Essential (primary) hypertension: Secondary | ICD-10-CM

## 2010-07-15 DIAGNOSIS — M353 Polymyalgia rheumatica: Secondary | ICD-10-CM

## 2010-07-15 DIAGNOSIS — E291 Testicular hypofunction: Secondary | ICD-10-CM

## 2010-07-15 LAB — BASIC METABOLIC PANEL
BUN: 21 mg/dL (ref 6–23)
CO2: 31 mEq/L (ref 19–32)
Calcium: 9.1 mg/dL (ref 8.4–10.5)
Chloride: 102 mEq/L (ref 96–112)
Creatinine, Ser: 1.2 mg/dL (ref 0.4–1.5)

## 2010-07-15 LAB — HEPATIC FUNCTION PANEL
ALT: 9 U/L (ref 0–53)
AST: 23 U/L (ref 0–37)
Bilirubin, Direct: 0.2 mg/dL (ref 0.0–0.3)

## 2010-07-15 LAB — LIPID PANEL
Cholesterol: 137 mg/dL (ref 0–200)
HDL: 37.3 mg/dL — ABNORMAL LOW (ref >39.00)
LDL Cholesterol: 85 mg/dL (ref 0–99)

## 2010-07-15 LAB — TSH: TSH: 1.79 u[IU]/mL (ref 0.35–5.50)

## 2010-07-17 ENCOUNTER — Ambulatory Visit: Payer: Self-pay | Admitting: Internal Medicine

## 2010-07-21 NOTE — Assessment & Plan Note (Signed)
Summary: 6 MO FU/LB 2:50PM APPT   Vital Signs:  Patient profile:   77 year old male Height:      69 inches Weight:      178 pounds BMI:     26.38 O2 Sat:      96 % on Room air Temp:     97.7 degrees F oral Pulse rate:   54 / minute BP sitting:   142 / 78  (left arm) Cuff size:   regular  Vitals Entered By: Bill Salinas CMA (July 15, 2010 10:38 AM)  O2 Flow:  Room air CC: 6 month follow up, pt states doing well/ ab   Primary Care Provider:  Nilo Fallin  CC:  6 month follow up and pt states doing well/ ab.  History of Present Illness: Mr. Route he is feeling well. He is entering into a push up contest with a woman in rotary. He is feeling generally well. He has had no change in medications. He did try Cialis which was very pricey. He does have some tenderness still from his last testosterone injection 2/29.  He is otherwise doing very well.   Current Medications (verified): 1)  Multivitamins   Tabs (Multiple Vitamin) .... Take One Tablet Daily 2)  Synthroid 50 Mcg  Tabs (Levothyroxine Sodium) .... Take One Tablet Daily 3)  Refresh Tears 0.5 %  Soln (Carboxymethylcellulose Sodium) .... Use 2-3 Drops At Bedtime 4)  Lipitor 10 Mg  Tabs (Atorvastatin Calcium) .... Take One Tablet Daily 5)  Enalapril Maleate 5 Mg  Tabs (Enalapril Maleate) .... Take One Tablet Daily 6)  Aspirin 325 Mg  Tabs (Aspirin) .... Take One Tablet Daily 7)  Glucosamine-Chondroitin-Msm [phone] Mg  Tabs (Glucosa-Chondr-Na Chondr-Msm) .... Take One Tablet Daily 8)  Cialis 10 Mg Tabs (Tadalafil) .Marland Kitchen.. 1 Once Daily As Needed As Directed 9)  Testosterone Cypionate 200 Mg/ml Oil (Testosterone Cypionate) .... 2 Ml Im Monthly For Testosterone Deficiency  Allergies (verified): No Known Drug Allergies  Past History:  Past Medical History: Last updated: 01/03/2008 ATRIAL FIBRILLATION, HX OF (ICD-V12.59)-s/p ablation at [facility] clinic '07 NEOPLASM OF UNCERTAIN BEHAVIOR OF SKIN (ICD-238.2) Hx of POLYMYALGIA  RHEUMATICA (ICD-725) HYPERLIPIDEMIA (ICD-272.4) HYPOTHYROIDISM (ICD-244.9)  Past Surgical History: Last updated: 01/03/2008 HAND SURGERY (ICD-763.1) TONSILLECTOMY, HX OF (ICD-V45.79) INGUINAL HERNIORRHAPHY, HX OF (ICD-V45.89) CHOLECYSTECTOMY, HX OF (ICD-V45.79) Cataract extraction bilateral Summer '09 FH reviewed for relevance, SH/Risk Factors reviewed for relevance  Review of Systems  The patient denies anorexia, fever, weight loss, weight gain, decreased hearing, chest pain, syncope, dyspnea on exertion, prolonged cough, hemoptysis, abdominal pain, severe indigestion/heartburn, incontinence, muscle weakness, transient blindness, depression, abnormal bleeding, and angioedema.    Physical Exam  General:  Well-developed,well-nourished,in no acute distress; alert,appropriate and cooperative throughout examination Head:  normocephalic, atraumatic, and no abnormalities observed.   Eyes:  vision grossly intact, pupils equal, pupils round, corneas and lenses clear, and no injection.   Neck:  supple, full ROM, no masses, no thyromegaly, and no carotid bruits.   Chest Wall:  no deformities.   Lungs:  Normal respiratory effort, chest expands symmetrically. Lungs are clear to auscultation, no crackles or wheezes. Heart:  normal rate, regular rhythm, no gallop, and no JVD.   Abdomen:  soft, non-tender, and normal bowel sounds.   Msk:  normal ROM, no joint tenderness, no joint swelling, and no joint warmth.   Pulses:  2+ radial Extremities:  No clubbing, cyanosis, edema, or deformity noted with normal full range of motion of all joints.   Neurologic:  alert & oriented X3, cranial nerves II-XII intact, strength normal in all extremities, and gait normal.   Skin:  turgor normal, color normal, no rashes, no ecchymoses, and no ulcerations.   Cervical Nodes:  no anterior cervical adenopathy and no posterior cervical adenopathy.   Psych:  Oriented X3, memory intact for recent and remote, normally  interactive, and good eye contact.     Impression & Recommendations:  Problem # 1:  HYPOGONADISM (ICD-257.2)  Orders: TLB-Testosterone, Total (84403-TESTO)  Testosterone level in normal range at mid -point of injection schedule.  Plan - continue present replacement dosing  Problem # 2:  HYPERTENSION, BENIGN (ICD-401.1)  His updated medication list for this problem includes:    Enalapril Maleate 5 Mg Tabs (Enalapril maleate) .Marland Kitchen... Take one tablet daily  Orders: TLB-BMP (Basic Metabolic Panel-BMET) (80048-METABOL)  BP today: 142/78 Prior BP: 120/70 (01/16/2010)  BP mildly elevated today. Labs OK.  Plan - continue present meds  Problem # 3:  HYPERLIPIDEMIA (ICD-272.4)  His updated medication list for this problem includes:    Lipitor 10 Mg Tabs (Atorvastatin calcium) .Marland Kitchen... Take one tablet daily  Orders: TLB-Lipid Panel (80061-LIPID) TLB-Hepatic/Liver Function Pnl (80076-HEPATIC)  ASddednum- lab reveals excellent control with normal liver functions.  Plan - continue present medication  Problem # 4:  HYPOTHYROIDISM (ICD-244.9)  His updated medication list for this problem includes:    Synthroid 50 Mcg Tabs (Levothyroxine sodium) .Marland Kitchen... Take one tablet daily  Orders: TLB-TSH (Thyroid Stimulating Hormone) (84443-TSH)  Addendum - thyroid labs are normal  Problem # 5:  Hx of POLYMYALGIA RHEUMATICA (ICD-725)  Orders: TLB-Sedimentation Rate (ESR) (85652-ESR)  ESR is normal range - no indication of recurrence.  Complete Medication List: 1)  Multivitamins Tabs (Multiple vitamin) .... Take one tablet daily 2)  Synthroid 50 Mcg Tabs (Levothyroxine sodium) .... Take one tablet daily 3)  Refresh Tears 0.5 % Soln (Carboxymethylcellulose sodium) .... Use 2-3 drops at bedtime 4)  Lipitor 10 Mg Tabs (Atorvastatin calcium) .... Take one tablet daily 5)  Enalapril Maleate 5 Mg Tabs (Enalapril maleate) .... Take one tablet daily 6)  Aspirin 325 Mg Tabs (Aspirin) .... Take one  tablet daily 7)  Glucosamine-chondroitin-msm [phone] Mg Tabs (Glucosa-chondr-na chondr-msm) .... Take one tablet daily 8)  Cialis 10 Mg Tabs (Tadalafil) .Marland Kitchen.. 1 once daily as needed as directed 9)  Testosterone Cypionate 200 Mg/ml Oil (Testosterone cypionate) .... 2 ml im monthly for testosterone deficiency  Patient: [name] Note: All result statuses are Final unless otherwise noted.  Tests: (1) Testosterone, Total (TESTO)   Testosterone              358.33 ng/dL                981.19-147.82  Tests: (2) BMP (METABOL)   Sodium                    138 mEq/L                   135-145   Potassium            [H]  5.9 mEq/L                   3.5-5.1   Chloride                  102 mEq/L                   96-112   Carbon Dioxide  31 mEq/L                    19-32   Glucose                   81 mg/dL                    59-56   BUN                       21 mg/dL                    3-87   Creatinine                1.2 mg/dL                   5.6-4.3   Calcium                   9.1 mg/dL                   3.2-95.1   GFR                       60.37 mL/min                >60.00  Tests: (3) Lipid Panel (LIPID)   Cholesterol               137 mg/dL                   8-841     ATP III Classification            Desirable:  < 200 mg/dL                    Borderline High:  200 - 239 mg/dL               High:  > = 240 mg/dL   Triglycerides             74.0 mg/dL                  6.6-063.0     Normal:  <150 mg/dL     Borderline High:  160 - 199 mg/dL   HDL                  [L]  10.93 mg/dL                 >23.55   VLDL Cholesterol          14.8 mg/dL                  7.3-22.0   LDL Cholesterol           85 mg/dL                    2-54  CHO/HDL Ratio:  CHD Risk                             4                    Men          Women     1/2 Average Risk     3.4          3.3  Average Risk          5.0          4.4     2X Average Risk          9.6          7.1     3X Average Risk           15.0          11.0                           Tests: (4) Hepatic/Liver Function Panel (HEPATIC)   Total Bilirubin           1.0 mg/dL                   1.6-1.0   Direct Bilirubin          0.2 mg/dL                   9.6-0.4   Alkaline Phosphatase      53 U/L                      39-117   AST                       23 U/L                      0-37   ALT                       9 U/L                       0-53   Total Protein             6.6 g/dL                    5.4-0.9   Albumin                   4.2 g/dL                    8.1-1.9  Tests: (5) TSH (TSH)   FastTSH                   1.79 uIU/mL                 0.35-5.50  Tests: (6) Sed Rate (ESR)   Sed Rate                  8 mm/hr                     0-22  Orders Added: 1)  TLB-Testosterone, Total [84403-TESTO] 2)  TLB-BMP (Basic Metabolic Panel-BMET) [80048-METABOL] 3)  TLB-Lipid Panel [80061-LIPID] 4)  TLB-Hepatic/Liver Function Pnl [80076-HEPATIC] 5)  TLB-TSH (Thyroid Stimulating Hormone) [84443-TSH] 6)  TLB-Sedimentation Rate (ESR) [85652-ESR] 7)  Est. Patient Level IV [14782]

## 2010-07-30 ENCOUNTER — Ambulatory Visit (INDEPENDENT_AMBULATORY_CARE_PROVIDER_SITE_OTHER): Payer: Medicare Other | Admitting: *Deleted

## 2010-07-30 DIAGNOSIS — E291 Testicular hypofunction: Secondary | ICD-10-CM

## 2010-07-30 MED ORDER — TESTOSTERONE CYPIONATE 200 MG/ML IM SOLN
400.0000 mg | Freq: Once | INTRAMUSCULAR | Status: AC
  Administered 2010-07-30: 400 mg via INTRAMUSCULAR

## 2010-07-30 NOTE — Progress Notes (Signed)
Testosterone inj given

## 2010-08-26 ENCOUNTER — Ambulatory Visit (INDEPENDENT_AMBULATORY_CARE_PROVIDER_SITE_OTHER): Payer: Medicare Other | Admitting: *Deleted

## 2010-08-26 DIAGNOSIS — E291 Testicular hypofunction: Secondary | ICD-10-CM

## 2010-08-26 MED ORDER — TESTOSTERONE CYPIONATE 200 MG/ML IM SOLN
200.0000 mg | INTRAMUSCULAR | Status: DC
  Administered 2010-08-26: 400 mg via INTRAMUSCULAR

## 2010-09-15 NOTE — Assessment & Plan Note (Signed)
[facility]                           PRIMARY CARE OFFICE NOTE   JAMONE, GARRIDO                       MRN:          [id number]  DATE:01/09/2007                            DOB:          1926-04-02    ADDENDUM:  The patient did have a thorough skin exam.  He was noted to  have a macular papular lesion on his low back that was tanned in the  majority, but had a small area that was raised and dark.   The patient had a tanned rounded lesion above the right brow, which on  exam with a hand-held lens shows microvasculature.   The patient does need to return for a shave biopsy of the lesion on his  back.  He will be referred to the skin surgery center for evaluation of  a possible basal cell carcinoma at his right eyebrow, which would need  surgical excision.     Rosalyn Gess Norins, MD  Electronically Signed    MEN/MedQ  DD: 01/09/2007  DT: 01/10/2007  Job #: [id number]   cc:   Tarry Kos

## 2010-09-15 NOTE — Assessment & Plan Note (Signed)
[facility]                           PRIMARY CARE OFFICE NOTE   QUENTIN, SHOREY                       MRN:          [id number]  DATE:01/09/2007                            DOB:          [birth year]    Mr. Honeyman is a 77 year old gentleman last seen in the office July 06, 2006, for a followup visit.  In the interval he has done well.  He did  have a viral upper respiratory infection and had some rumbling in his  ears.  He is also concerned about potential skin lesions.   PAST MEDICAL HISTORY:  Is well documented in previous chart notes.  Please see January 06, 2006.   CURRENT MEDICATIONS:  1. Refresh Tears daily.  2. Multivitamin daily.  3. Synthroid 50 mcg daily.  4. Lipitor 10 mg daily.  5. Enalapril 5 mg daily.  6. Aspirin 325 mg daily.  7. Glucosamine daily.  8. Viagra p.r.n.   REVIEW OF SYSTEMS:  Negative for any constitutional symptoms.  He has  had an eye exam in the last 12 months and does have early cataracts.  The patient has had dental work with an implant performed of his right  mandible for a premolar.  The patient has some improved physical  endurance by his report.  He has no cardiovascular complaints otherwise,  no respiratory or GI complaints.  The patient does have mild daytime  frequency and nocturia times 1 to 2.   EXAMINATION:  Temperature was 97.4, blood pressure 138/68, pulse 44,  weight 180.  GENERAL APPEARANCE:  A well-nourished, well-developed gentleman looking  younger than his stated chronologic age.  HEENT EXAM:  Normocephalic, atraumatic, EACs and TMs were normal.  Oropharynx with native dentition in good repair.  He does have a place  where an implant has been done but there is no obvious abnormality or  disease.  Posterior pharynx was clear.  Conjunctiva and sclera was  clear.  Pupils equal, round, reactive to light and accommodation.  Handheld instrument exam revealed normal disks and disk margins.  NECK:  Supple without thyromegaly.  NODES:  No adenopathy was noted in the cervical or supraclavicular  regions.  CHEST:  No CVA tenderness.  LUNGS:  Clear to auscultation and percussion.  CARDIOVASCULAR:  Radial pulses 2+, no JVD or carotid bruits.  He had a  quiet precordium with a regular rate and rhythm without murmurs, rubs or  gallops.  ABDOMEN:  Soft, no guarding, no rebound, no organosplenomegaly was  appreciated.  GENITALIA:  Normal male, bilaterally distended testicles without masses.  RECTAL EXAM:  Normal sphincter tone was noted.  Prostate was smooth and  normal in size and contour.  EXTREMITIES:  Without clubbing, cyanosis, edema, no deformities were  noted.  NEUROLOGIC EXAM:  Nonfocal.   The patient is due for some followup laboratory to include basic  metabolic panel, TSH, PSA.  Will defer getting a lipid panel, with the  last study being March of 2008 and it was normal.   ASSESSMENT AND PLAN:  1. Lipids.  Patient has been well controlled with the  last LDL being      97.  He will continue his present medications.  2. Hypertension.  The patient's blood pressure is adequately      controlled on his present medications.  3. Cardiovascular.  Patient with a history of atrial fibrillation      status post ablation procedure at the [facility]      and he is doing well with a regular rate and rhythm.  4. PMR.  The patient is stable with no recurrence.  5. Hypothyroid disease.  Laboratory is ordered and pending.  Will      change medications as indicated.   In summary, a very pleasant gentleman who is seen as medically stable at  this time.  He is asked to return to see me in 6 months for routine  followup.   ADDENDUM:  The patient did have a thorough skin exam.  He was noted to  have a macular papular lesion on his low back that was tanned in the  majority, but had a small area that was raised and dark.   The patient had a tanned rounded lesion above  the right brow, which on  exam with a hand-held lens shows microvasculature.   The patient does need to return for a shave biopsy of the lesion on his  back.  He will be referred to the skin surgery center for evaluation of  a possible basal cell carcinoma at his right eyebrow, which would need  surgical excision.     Rosalyn Gess Norins, MD  Electronically Signed    MEN/MedQ  DD: 01/09/2007  DT: 01/10/2007  Job #: [id number]   cc:   Tarry Kos

## 2010-09-18 NOTE — Op Note (Signed)
   [name], [name]                          ACCOUNT NO.:  [id number]   MEDICAL RECORD NO.:  [id number]                   PATIENT TYPE:  OIB   LOCATION:  2872                                 FACILITY:  [facility]   PHYSICIAN:  Duke Salvia, M.D.               DATE OF BIRTH:  Mar 03, 1926   DATE OF PROCEDURE:  10/12/2002  DATE OF DISCHARGE:                                 OPERATIVE REPORT   PREOPERATIVE DIAGNOSIS:  Atrial fibrillation.   POSTOPERATIVE DIAGNOSIS:  Sinus rhythm.   PROCEDURE:  DC cardioversion.   DESCRIPTION OF PROCEDURE:  The patient was submitted to general anesthesia  under the care of Sheldon Silvan, M.D.  He received 200 mg of IV Pentothal.   A 200 joule shock delivered with a biphasic waveform successfully converted  the patient from atrial fibrillation to sinus rhythm.   The patient tolerated the procedure without apparent complications. He is  moving minimally at this time.                                                Duke Salvia, M.D.    SCK/MEDQ  D:  10/12/2002  T:  10/13/2002  Job:  [id number]

## 2010-09-18 NOTE — Op Note (Signed)
[name], [name]                ACCOUNT NO.:  [id number]   MEDICAL RECORD NO.:  [id number]          PATIENT TYPE:  AMB   LOCATION:  ENDO                         FACILITY:  [facility]   PHYSICIAN:  Olga Millers, M.D. LHCDATE OF BIRTH:  1926-01-16   DATE OF PROCEDURE:  06/02/2004  DATE OF DISCHARGE:                                 OPERATIVE REPORT   PROCEDURE:  Cardioversion of atrial flutter.   The patient did have a transesophageal echocardiogram prior to the procedure  that revealed no left atrial appendage or thrombus. He was subsequently  sedated by anesthesia with Pentothal 75 mg intravenously. Synchronized  cardioversion with 120 joules (biphasic) resulted in a greater than 7 second  pause, followed marked sinus bradycardia with a heart rate in the 30s. His  heart rate then improved to the 40s and eventually to the 50s. His sinus  bradycardia was in the 95 to 100 range, and he was asymptomatic. There were  no immediate complications. We will continue the Coumadin. I did discuss the  patient with Dr. Graciela Husbands, who is the patient's physician. We will discontinue  his Cardizem. He will see Dr. Graciela Husbands back in the office in the next two  weeks.      BC/MEDQ  D:  06/02/2004  T:  06/02/2004  Job:  [id number]

## 2010-09-18 NOTE — Assessment & Plan Note (Signed)
[facility]                           PRIMARY CARE OFFICE NOTE   ZAVION, SLEIGHT                       MRN:          [id number]  DATE:07/06/2006                            DOB:          08/23/1925    [name] is a 77 year old gentleman who is followed for history of  atrial fibrillation status post ablation procedures, hyperlipidemia,  hyperthyroid disease, PMR presents for follow up evaluation and exam.  He was last seen January 06, 2006, please that complete dictation.   INTERVAL HISTORY:  The patient fortunately is fairing very well in the  main.  He did have a episode of dizziness with eustachian tube  dysfunction, which now has resolved.  He has had no cardiac complaints  and his heart rhythm has been stable as far as he knows.  The patient is  due for follow up colonoscopy based on his last study July 24, 2003  where he had tubular adenomatous polyp.  This was scheduled for the  patient August 05, 2006 with a preoperative visit March 17.  He is aware  of these appointments.   CURRENT MEDICATIONS:  1. Refresh tears.  2. Multivitamin.  3. Synthroid 50 mcg daily.  4. Lipitor 10 mg daily.  5. Enalapril 5 mg daily.  6. Glucosamine and Chondroitin with good results.   REVIEW OF SYSTEMS:  Negative for any constitution, cardiovascular,  respiratory problems.   LIMITED EXAM:  Temperature was 97.7, blood pressure 117/70, pulse 50,  weight 184.  GENERAL APPEARANCE:  A well-nourished, well-developed gentleman in no  acute distress.  CHEST:  Clear with no rales, wheezes or rhonchi.  CARDIOVASCULAR:  Have 2+ radial pulses with no jugular venous distension  or carotid bruits.  He had a regular rate and rhythm.  No further exam  conducted.   Laboratory ordered and pending.  Includes, TSH, lipid panel, liver  functions.   ASSESSMENT/PLAN:  1. Cardiovascular.  The patient stable with stable rhythm.  2. Hyperlipidemia.  The patient has  laboratory ordered and pending.      He will be notified by a phone tree of results.  Of note, he has      been well controlled with last LDL being 91, September 6 of 2007.  3. Rheum.  The patients PMR is quiescent.  His joint pain and      stiffness is markedly improved with Glucosamine.   In summary, the patient with medical problems as outlined above.  He  does seem stable and doing well at this time.  He will be notified by a  phone tree of his lab results.  He will return to see me in 6 months for  a complete physical exam.     Rosalyn Gess. Norins, MD  Electronically Signed    MEN/MedQ  DD: 07/07/2006  DT: 07/07/2006  Job #: [id number]   cc:   Tarry Kos

## 2010-09-18 NOTE — Op Note (Signed)
[name], [name]                ACCOUNT NO.:  [id number]   MEDICAL RECORD NO.:  [id number]          PATIENT TYPE:  AMB   LOCATION:  DAY                          FACILITY:  [facility]   PHYSICIAN:  Angelia Mould. Derrell Lolling, M.D.DATE OF BIRTH:  February 01, 1926   DATE OF PROCEDURE:  11/30/2004  DATE OF DISCHARGE:                                 OPERATIVE REPORT   PREOPERATIVE DIAGNOSIS:  Left inguinal hernia.   POSTOPERATIVE DIAGNOSIS:  Left inguinal hernia.   OPERATION PERFORMED:  Repair left inguinal hernia with mesh Armanda Heritage  repair).   SURGEON:  Angelia Mould. Derrell Lolling, M.D.   OPERATIVE INDICATIONS:  This is a 77 year old white man who has had a hernia  on the left side for some time, it is not clear but at least for several  months and may be longer. It has been getting bigger and is uncomfortable.  On exam, he has a large left inguinal hernia that is completely reducible  and does not extend into the scrotum. I do not feel any hernias elsewhere.  He is brought to the operating room electively.   OPERATIVE TECHNIQUE:  The patient was placed supine on the operating table.  He was monitored and sedated by the anesthesia department. Ultimately he had  to be intubated because of abdominal muscle tone and high abdominal  pressure. The left groin was prepped and draped in a sterile fashion. The  patient was identified. Intravenous antibiotics were given. 1% Xylocaine  with epinephrine was used as a local infiltration anesthetic. An oblique  incision was made in the left groin. Dissection was carried down through the  subcutaneous tissue exposing the aponeurosis of the external oblique. The  external oblique was incised in the direction of its fibers and dissected  off of the underlying structures. Self-retaining retractors were placed. I  found that he had a very a large direct inguinal hernia and large lipomas  associated with the cord making the anatomy somewhat difficult to dissect  out.  Nevertheless, we did encircle the cord structures with a Penrose drain.  We did dissect the large direct hernia sac away from the cord structures,  found that it contained a sliding component of what looked like colon. This  was dissected away from the cord and away from the lipomas of the cord. The  lipomas of the cord were individually dissected away and then clamped,  divided, removed and ligated with 2-0 Vicryl ties. I found that he had an  extremely distorted inferior epigastric artery. I very carefully dissected  this away from the other structures making sure that it was separate from  the vas deferens and the testicular vessels. I identified the inferior  epigastric artery coming out behind the rectus muscle extending down toward  the femoral triangle. This had to be a clamped proximally and distally,  divided and ligated with 2-0 silk ties.   Once I had all the lipomas debrided, I then reduced the direct hernia sac  and oversewed it with imbricating suture of 2-0 Vicryl to hold it in place.  I then inspected the cord structures and  I could not find any evidence of an  indirect hernia. The floor of the inguinal canal was then repaired and  reinforced with a 3-inch x 6-inch piece of polypropylene mesh. This was  trimmed at the corners to fit the wound but most of the mesh was used. The  mesh was sutured in place with a interrupted and running sutures of 2-0  Prolene. The mesh was sutured so as to generously overlap the fascia at the  pubic tubercle, along the inguinal ligament inferiorly. Medially and  superomedially, we used interrupted mattress sutures of 2-0 Prolene.  Superiorly and superolaterally we used a running suture of 2-0 Prolene. The  mesh was incised laterally so as to wrap around the cord structures of the  internal ring. The tails of the mesh were overlapped laterally and the  suture lines completed. We placed two further sutures of 2-0 Prolene, one  medially and one  laterally to snug up the mesh around the cord structures.  This provided a very secure repair both medial and lateral to the cord  structures but allowed an adequate opening for the cord structures. The  wound was irrigated with saline. Hemostasis was excellent. The external  oblique was closed with running suture of 2-0 Vicryl placing the cord  structures deep to the external oblique. Scarpa's fascia was closed with 3-0  Vicryl suture and the skin was closed with a running subcuticular suture of  4-0 Monocryl and Steri-Strips. Clean bandages were placed and the patient  taken to the recovery in stable condition. Estimated blood loss was about 15-  20 mL. Complications none. Sponge, needle and instrument counts were  correct.       HMI/MEDQ  D:  11/30/2004  T:  12/01/2004  Job:  [id number]   cc:   Rosalyn Gess. Norins, M.D. [facility]   Duke Salvia, M.D.

## 2010-09-18 NOTE — Assessment & Plan Note (Signed)
[facility]                             PRIMARY CARE OFFICE NOTE   [name], [name]                       MRN:          [id number]  DATE:01/06/2006                            DOB:          1925-05-10    [name] is a 77 year old gentleman who is followed for a history of  atrial fibrillation, status post ablation procedure, currently stable and no  longer followed closely by EP.  History of hyperlipidemia, hypothyroid  disease and PMR.  He was last seen July 12, 2005.  In the interval he has  done extremely well with no major medical problems and feeling well.   PAST MEDICAL HISTORY:  Surgical:  1. Correction of strabismus.  2. Cholecystectomy.  3. Bilateral inguinal herniorrhaphy.  4. Hand surgery in the past.  5. Left inguinal hernia repair redo in July 2006.  6. Remote tonsillectomy.   Medical:  The patient had the usual childhood diseases and the problems s  outlined above.   CURRENT MEDICATIONS:  1. Refresh tears daily.  2. Multivitamin daily.  3. Synthroid 50 mcg daily.  4. Lipitor 10 mg daily.  5. Enalapril 5 mg daily.  6. Aspirin 325 mg daily.  7. Glucosamine daily.  8. Viagra on an as-needed basis.   FAMILY HISTORY:  Well-documented in previous chart notes and  noncontributory.   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  He remains very active.  He exercises  on a regular basis.   CHART REVIEW:  Last colonoscopy July 24, 2003, with colon polyps that were  tubular adenomas.  He would be a candidate for follow-up in 2008.  Last EP  note in EP section August 20, 2004, revealed the patient to be stable.  Last  rest-stress Cardiolite May 2004 with an EF of 45% with no evidence of any  active ischemia.  Last 2-D echo from the [facility] August 17, 2004, with an EF of 60% plus or minus 5.   The patient had evaluation for hearing loss and  had bilateral severe high-  frequency hearing loss diagnosed by Dr. Pollyann Kennedy, natural audiometry.  Chest x-  ray from October 12, 2002, with no active disease.  Last 12-lead  electrocardiogram from October 16, 2004, which was normal.   REVIEW OF SYSTEMS:  Negative for any constitutional, cardiovascular,  respiratory, GI or GU complaints.   PHYSICAL EXAMINATION:  VITAL SIGNS:  Temperature was 97.5, blood pressure  143/77, pulse 47, weight 183.  GENERAL APPEARANCE:  A well-nourished, well-developed gentleman who looks  his stated age, in no acute distress.  HEENT:  Normocephalic, atraumatic.  EACs and TMs were unremarkable.  Oropharynx with native dentition in good repair.  No buccal or palatal  lesions were noted.  Posterior pharynx was clear.  Conjunctivae and sclerae  were clear.  PERRLA, EOMI.  Funduscopic exam with hand-held instrument  revealed normal disc margins with no vascular abnormalities.  NECK:  Supple without thyromegaly.  NODES:  No adenopathy was noted in the  cervical or supraclavicular regions.  CHEST:  No CVA tenderness.  LUNGS:  Clear to auscultation and percussion.  CARDIOVASCULAR:  2+ radial pulse, no JVD or thyroid bruits.  He had a quiet  precordium with regular rate and rhythm without murmurs, rubs or gallops.  ABDOMEN:  Soft, no guarding or rebound.  No organomsplenomegaly was noted.  GENITALIA:  Normal with bilaterally descended testicles without masses.  RECTAL:  Normal sphincter tone.  Prostate was normal in size and contour  without nodules.  EXTREMITIES:  Without clubbing, cyanosis, edema.  No deformities are noted.   Last laboratory was in March 2007 and unremarkable with an LDL of 96,  glucose of 109.  The patient is for laboratory today to include lipid panel,  AST, ALT, metabolic panel, PSA.   ASSESSMENT AND PLAN:  1. Atrial fibrillation, resolved after ablation.  2. Hyperlipidemia.  The patient has been well-controlled.  New      laboratories ordered and  pending.  Will adjust his medications as      needed.  3. Hypothyroid disease.  New laboratories ordered and pending.  Will      adjust his medication as needed.  4. Rheumatology.  The patient is very stable at this time with no      recurrent flare of his polymyalgia rheumatica.  He does exercise on a      regular basis.  5. Gastrointestinal.  Patient with a history of adenomatous polyps, for      follow-up in 2008.   In summary, the patient is medically stable at this time.  He will be  notified by phone for his lab results and will return to see me in 6 months  for follow-up.                                   Rosalyn Gess Norins, MD   MEN/MedQ  DD:  01/06/2006  DT:  01/07/2006  Job #:  [id number]   cc:   Lowell Bouton

## 2010-09-23 ENCOUNTER — Ambulatory Visit (INDEPENDENT_AMBULATORY_CARE_PROVIDER_SITE_OTHER): Payer: Medicare Other

## 2010-09-23 DIAGNOSIS — E291 Testicular hypofunction: Secondary | ICD-10-CM

## 2010-09-23 MED ORDER — TESTOSTERONE CYPIONATE 200 MG/ML IM SOLN
400.0000 mg | Freq: Once | INTRAMUSCULAR | Status: AC
  Administered 2010-09-23: 400 mg via INTRAMUSCULAR

## 2010-09-24 ENCOUNTER — Other Ambulatory Visit: Payer: Self-pay | Admitting: *Deleted

## 2010-09-24 MED ORDER — ATORVASTATIN CALCIUM 10 MG PO TABS: 10.0000 mg | ORAL_TABLET | Freq: Every day | ORAL | Status: DC

## 2010-09-24 MED ORDER — LEVOTHYROXINE SODIUM 50 MCG PO TABS: 50.0000 ug | ORAL_TABLET | Freq: Every day | ORAL | Status: DC

## 2010-09-24 MED ORDER — ENALAPRIL MALEATE 5 MG PO TABS: 5.0000 mg | ORAL_TABLET | Freq: Every day | ORAL | Status: DC

## 2010-10-09 ENCOUNTER — Other Ambulatory Visit: Payer: Self-pay | Admitting: *Deleted

## 2010-10-09 MED ORDER — SILDENAFIL CITRATE 100 MG PO TABS: 50.0000 mg | ORAL_TABLET | ORAL | Status: DC | PRN

## 2010-10-15 ENCOUNTER — Telehealth: Payer: Self-pay | Admitting: *Deleted

## 2010-10-15 MED ORDER — TADALAFIL 10 MG PO TABS: 10.0000 mg | ORAL_TABLET | ORAL | Status: DC | PRN

## 2010-10-15 NOTE — Telephone Encounter (Signed)
[employer] [facility] delivery called - pt has req RX for cialis. Viagra is on the list. If ok for cialis need details and qty for 3 mth supply.

## 2010-10-15 NOTE — Telephone Encounter (Signed)
Done

## 2010-10-15 NOTE — Telephone Encounter (Signed)
Ok to change to cialis 10mg . One as needed #18 for 3 months

## 2010-10-21 ENCOUNTER — Ambulatory Visit: Payer: Medicare Other | Admitting: *Deleted

## 2010-10-21 DIAGNOSIS — E291 Testicular hypofunction: Secondary | ICD-10-CM

## 2010-10-21 MED ORDER — TESTOSTERONE CYPIONATE 200 MG/ML IM SOLN
400.0000 mg | Freq: Once | INTRAMUSCULAR | Status: AC
  Administered 2010-10-21: 400 mg via INTRAMUSCULAR

## 2010-11-18 ENCOUNTER — Ambulatory Visit (INDEPENDENT_AMBULATORY_CARE_PROVIDER_SITE_OTHER): Payer: Medicare Other | Admitting: *Deleted

## 2010-11-18 DIAGNOSIS — E291 Testicular hypofunction: Secondary | ICD-10-CM

## 2010-11-18 MED ORDER — TESTOSTERONE CYPIONATE 200 MG/ML IM SOLN
400.0000 mg | Freq: Once | INTRAMUSCULAR | Status: AC
  Administered 2010-11-18: 400 mg via INTRAMUSCULAR

## 2010-12-16 ENCOUNTER — Ambulatory Visit: Payer: Medicare Other | Admitting: *Deleted

## 2010-12-16 DIAGNOSIS — E291 Testicular hypofunction: Secondary | ICD-10-CM

## 2010-12-16 MED ORDER — TESTOSTERONE CYPIONATE 200 MG/ML IM SOLN
200.0000 mg | Freq: Once | INTRAMUSCULAR | Status: AC
  Administered 2010-12-16: 200 mg via INTRAMUSCULAR

## 2011-01-13 ENCOUNTER — Ambulatory Visit (INDEPENDENT_AMBULATORY_CARE_PROVIDER_SITE_OTHER): Payer: Medicare Other | Admitting: *Deleted

## 2011-01-13 DIAGNOSIS — E291 Testicular hypofunction: Secondary | ICD-10-CM

## 2011-01-13 MED ORDER — TESTOSTERONE CYPIONATE 200 MG/ML IM SOLN
400.0000 mg | Freq: Once | INTRAMUSCULAR | Status: AC
  Administered 2011-01-13: 400 mg via INTRAMUSCULAR

## 2011-01-25 ENCOUNTER — Ambulatory Visit (INDEPENDENT_AMBULATORY_CARE_PROVIDER_SITE_OTHER): Payer: Medicare Other | Admitting: Internal Medicine

## 2011-01-25 ENCOUNTER — Other Ambulatory Visit (INDEPENDENT_AMBULATORY_CARE_PROVIDER_SITE_OTHER): Payer: Medicare Other

## 2011-01-25 DIAGNOSIS — E785 Hyperlipidemia, unspecified: Secondary | ICD-10-CM

## 2011-01-25 DIAGNOSIS — M353 Polymyalgia rheumatica: Secondary | ICD-10-CM

## 2011-01-25 DIAGNOSIS — E291 Testicular hypofunction: Secondary | ICD-10-CM

## 2011-01-25 DIAGNOSIS — I10 Essential (primary) hypertension: Secondary | ICD-10-CM

## 2011-01-25 LAB — HEPATIC FUNCTION PANEL
ALT: 10 U/L (ref 0–53)
AST: 21 U/L (ref 0–37)
Albumin: 4.1 g/dL (ref 3.5–5.2)
Bilirubin, Direct: 0.2 mg/dL (ref 0.0–0.3)
Total Bilirubin: 0.9 mg/dL (ref 0.3–1.2)
Total Protein: 7 g/dL (ref 6.0–8.3)

## 2011-01-25 LAB — LIPID PANEL
Cholesterol: 115 mg/dL (ref 0–200)
HDL: 39.7 mg/dL (ref >39.00)
LDL Cholesterol: 63 mg/dL (ref 0–99)
Triglycerides: 61 mg/dL (ref 0.0–149.0)

## 2011-01-25 MED ORDER — PREDNISONE 10 MG PO TABS: 10.0000 mg | ORAL_TABLET | Freq: Every day | ORAL | Status: AC

## 2011-01-25 NOTE — Progress Notes (Signed)
  Subjective:    Patient ID: [name], male    DOB: 1925/08/02, 77 y.o.   MRN: [id number]  HPI Mr. Dilger presents for medical follow-up. He has several concerns: 1. Recurrence of PMR - left neck, shoulders; he started taking prednisone and his symptoms resolved in June '12. He is exercising on a a regular basis.  2. Saturday the 15th he took 2.5 mg cialis and became pale and felt very bad. His BP did drop to 116 from usual 130's range.   I have reviewed the patient's medical history in detail and updated the computerized patient record.    Review of Systems System review is negative for any constitutional, cardiac, pulmonary, GI or neuro symptoms or complaints     Objective:   Physical Exam Vitals reviewed and normal. Reviewed outside BP readings Gen'l - WNWD whie male in no distress HEENT - C&S clear, oro-pharynx clear Chest - CTAP Cor- RRR, no murmur or gallop MSK - no tenderness to palpation shoulder girdle, thighs  Lab Results  Component Value Date   WBC 5.7 01/03/2008   HGB 16.0 01/03/2008   HCT 46.5 01/03/2008   PLT 165 01/03/2008   CHOL 115 01/25/2011   TRIG 61.0 01/25/2011   HDL 39.70 01/25/2011   ALT 10 01/25/2011   AST 21 01/25/2011   NA 138 07/15/2010   K 5.9* 07/15/2010   CL 102 07/15/2010   CREATININE 1.2 07/15/2010   BUN 21 07/15/2010   CO2 31 07/15/2010   TSH 1.79 07/15/2010   PSA 3.61 01/09/2007   HGBA1C 5.6 07/19/2008   Lab Results  Component Value Date   LDLCALC 63 01/25/2011          Assessment & Plan:

## 2011-01-26 NOTE — Assessment & Plan Note (Signed)
Tolerating testosterone replacement. He reports an incident of what sounds like hypotension following a dose of Cialis 2.5 mg. He usually has tolerated this well. He does not take any nitrate product.  Plan - cautioned about effect on BP when taking cialis but he may continue use.

## 2011-01-26 NOTE — Assessment & Plan Note (Signed)
Patient with recurrent symptoms in shoulder girdle for which he took a prednisone dosepak with resolution of symptoms. Doing well at exam with no tenderness  Plan - check ESR           Rx- prednisone burst and taper to take as needed.   Addendum - Sed rate = 6

## 2011-01-26 NOTE — Assessment & Plan Note (Signed)
BP Readings from Last 3 Encounters:  01/25/11 134/76  07/15/10 142/78  01/16/10 120/70   Adequate control on present medications

## 2011-01-26 NOTE — Assessment & Plan Note (Signed)
LDL is below goal of 100. No adverse reactions or complaints.  Plan - continue present medication

## 2011-02-10 ENCOUNTER — Ambulatory Visit: Payer: Medicare Other | Admitting: *Deleted

## 2011-02-10 DIAGNOSIS — E291 Testicular hypofunction: Secondary | ICD-10-CM

## 2011-02-10 MED ORDER — TESTOSTERONE CYPIONATE 200 MG/ML IM SOLN
200.0000 mg | INTRAMUSCULAR | Status: DC
  Administered 2011-02-10 – 2011-03-10 (×2): 200 mg via INTRAMUSCULAR

## 2011-03-10 ENCOUNTER — Ambulatory Visit (INDEPENDENT_AMBULATORY_CARE_PROVIDER_SITE_OTHER): Payer: Medicare Other | Admitting: Internal Medicine

## 2011-03-10 DIAGNOSIS — E291 Testicular hypofunction: Secondary | ICD-10-CM

## 2011-03-10 MED ORDER — TESTOSTERONE CYPIONATE 200 MG/ML IM SOLN: 200.0000 mg | INTRAMUSCULAR | Status: DC

## 2011-03-12 NOTE — Progress Notes (Signed)
  Subjective:    Patient ID: [name], male    DOB: [birth year], 77 y.o.   MRN: [id number]  HPI Nurse visit for T injection.   I have reviewed the patient's medical history in detail and updated the computerized patient record.    Review of Systems     Objective:   Physical Exam        Assessment & Plan:

## 2011-03-12 NOTE — Assessment & Plan Note (Signed)
Injection visit for testosterone replacement

## 2011-03-23 ENCOUNTER — Telehealth: Payer: Self-pay | Admitting: *Deleted

## 2011-03-23 NOTE — Telephone Encounter (Signed)
Refill request for testosterone CYP INJ 200 mg/ ML MDV Directions Inject 2 mLs intramuscularly monthly, Please Advise

## 2011-03-23 NOTE — Telephone Encounter (Signed)
Ok for refill: 10 cc multi-dose vial

## 2011-03-24 MED ORDER — TESTOSTERONE CYPIONATE 200 MG/ML IM SOLN: 400.0000 mg | INTRAMUSCULAR | Status: DC

## 2011-04-01 ENCOUNTER — Telehealth: Payer: Self-pay | Admitting: Internal Medicine

## 2011-04-01 MED ORDER — TESTOSTERONE CYPIONATE 200 MG/ML IM SOLN: 400.0000 mg | INTRAMUSCULAR | Status: DC

## 2011-04-01 NOTE — Telephone Encounter (Signed)
Faxed Rx to pharmacy for testosterone.

## 2011-04-12 ENCOUNTER — Ambulatory Visit: Payer: Medicare Other | Admitting: *Deleted

## 2011-04-12 DIAGNOSIS — E291 Testicular hypofunction: Secondary | ICD-10-CM

## 2011-04-12 MED ORDER — TESTOSTERONE CYPIONATE 100 MG/ML IM SOLN
200.0000 mg | INTRAMUSCULAR | Status: DC
  Administered 2011-04-12: 200 mg via INTRAMUSCULAR

## 2011-04-12 NOTE — Progress Notes (Signed)
  Subjective:    Patient ID: [name], male    DOB: Feb 09, 1926, 77 y.o.   MRN: [id number]  HPI Pt supplied medication    Review of Systems     Objective:   Physical Exam        Assessment & Plan:

## 2011-05-13 ENCOUNTER — Ambulatory Visit: Payer: Medicare Other | Admitting: *Deleted

## 2011-05-13 DIAGNOSIS — E291 Testicular hypofunction: Secondary | ICD-10-CM | POA: Diagnosis not present

## 2011-05-13 MED ORDER — TESTOSTERONE CYPIONATE 200 MG/ML IM SOLN
200.0000 mg | Freq: Once | INTRAMUSCULAR | Status: AC
  Administered 2011-05-13: 200 mg via INTRAMUSCULAR

## 2011-06-09 DIAGNOSIS — H905 Unspecified sensorineural hearing loss: Secondary | ICD-10-CM | POA: Diagnosis not present

## 2011-06-14 ENCOUNTER — Ambulatory Visit (INDEPENDENT_AMBULATORY_CARE_PROVIDER_SITE_OTHER): Payer: Medicare Other | Admitting: *Deleted

## 2011-06-14 DIAGNOSIS — E291 Testicular hypofunction: Secondary | ICD-10-CM | POA: Diagnosis not present

## 2011-06-14 MED ORDER — TESTOSTERONE CYPIONATE 200 MG/ML IM SOLN
400.0000 mg | INTRAMUSCULAR | Status: DC
  Administered 2011-06-14 – 2011-07-12 (×2): 400 mg via INTRAMUSCULAR

## 2011-06-17 ENCOUNTER — Encounter: Payer: Self-pay | Admitting: Gastroenterology

## 2011-07-12 ENCOUNTER — Ambulatory Visit (INDEPENDENT_AMBULATORY_CARE_PROVIDER_SITE_OTHER): Payer: Medicare Other | Admitting: *Deleted

## 2011-07-12 DIAGNOSIS — E291 Testicular hypofunction: Secondary | ICD-10-CM

## 2011-07-13 MED ORDER — TESTOSTERONE CYPIONATE 200 MG/ML IM SOLN: 400.0000 mg | INTRAMUSCULAR | Status: DC

## 2011-07-26 ENCOUNTER — Encounter: Payer: Self-pay | Admitting: Internal Medicine

## 2011-07-26 ENCOUNTER — Ambulatory Visit (INDEPENDENT_AMBULATORY_CARE_PROVIDER_SITE_OTHER): Payer: Medicare Other | Admitting: Internal Medicine

## 2011-07-26 ENCOUNTER — Other Ambulatory Visit (INDEPENDENT_AMBULATORY_CARE_PROVIDER_SITE_OTHER): Payer: Medicare Other

## 2011-07-26 VITALS — BP 142/72 | HR 57 | Temp 97.5°F | Resp 14 | Wt 174.8 lb

## 2011-07-26 DIAGNOSIS — I10 Essential (primary) hypertension: Secondary | ICD-10-CM

## 2011-07-26 DIAGNOSIS — M353 Polymyalgia rheumatica: Secondary | ICD-10-CM

## 2011-07-26 DIAGNOSIS — E039 Hypothyroidism, unspecified: Secondary | ICD-10-CM

## 2011-07-26 DIAGNOSIS — E785 Hyperlipidemia, unspecified: Secondary | ICD-10-CM

## 2011-07-26 DIAGNOSIS — E291 Testicular hypofunction: Secondary | ICD-10-CM

## 2011-07-26 DIAGNOSIS — Z8679 Personal history of other diseases of the circulatory system: Secondary | ICD-10-CM

## 2011-07-26 DIAGNOSIS — Z0000 Encounter for general adult medical examination without abnormal findings: Secondary | ICD-10-CM | POA: Insufficient documentation

## 2011-07-26 LAB — COMPREHENSIVE METABOLIC PANEL
ALT: 10 U/L (ref 0–53)
Albumin: 4.3 g/dL (ref 3.5–5.2)
CO2: 28 mEq/L (ref 19–32)
Chloride: 105 mEq/L (ref 96–112)
GFR: 70.06 mL/min (ref >60.00)
Potassium: 5.2 mEq/L — ABNORMAL HIGH (ref 3.5–5.1)
Sodium: 141 mEq/L (ref 135–145)
Total Bilirubin: 0.9 mg/dL (ref 0.3–1.2)
Total Protein: 7.4 g/dL (ref 6.0–8.3)

## 2011-07-26 LAB — HEPATIC FUNCTION PANEL
AST: 22 U/L (ref 0–37)
Alkaline Phosphatase: 50 U/L (ref 39–117)
Bilirubin, Direct: 0.2 mg/dL (ref 0.0–0.3)
Total Bilirubin: 0.9 mg/dL (ref 0.3–1.2)

## 2011-07-26 LAB — TSH: TSH: 1.83 u[IU]/mL (ref 0.35–5.50)

## 2011-07-26 LAB — LIPID PANEL
HDL: 45.2 mg/dL (ref >39.00)
Total CHOL/HDL Ratio: 3

## 2011-07-26 LAB — SEDIMENTATION RATE: Sed Rate: 10 mm/hr (ref 0–22)

## 2011-07-26 NOTE — Assessment & Plan Note (Signed)
LDL @ 76 is better than goal of less than 100.HDL @ 45 is better than goal of 39+. Liver functions are normal  Plan - continue present dose of lipitor.

## 2011-07-26 NOTE — Assessment & Plan Note (Signed)
Interval history is essentially benign. Physical exam is normal. He is current with colorectal cancer screening. General lab results are in normal limits with great lipid control and normal glucose over time range of 90+ days. He recalls having had pneumonia vaccine in the last 10 years - should have covered for life. PSA in '08 3.61 - normal. At 85 further screening is not indicated.  In summary - a very nice man who is medically stable. He will return in 6 months.

## 2011-07-26 NOTE — Assessment & Plan Note (Signed)
No a. Fib on exam. He reports he saw Dr. Graciela Husbands in the last year - eval was negative for A. Fib. On exam he had a RRR with PVCs.  No further eval or treatment

## 2011-07-26 NOTE — Assessment & Plan Note (Signed)
Feeling better and doing well.   Plan - follow-up testosterone level with recommendations to follow.

## 2011-07-26 NOTE — Patient Instructions (Signed)
Annual Medicare wellness exam: some Premature ventricular contractions. Exam is otherwise normal.   For lab today with full report to follow.   You seem to medically stable.

## 2011-07-26 NOTE — Progress Notes (Signed)
Subjective:    Patient ID: [name], male    DOB: Nov 12, 1925, 78 y.o.   MRN: [id number]  HPI The patient is here for annual Medicare wellness examination and management of other chronic and acute problems.  IN the interval since his last visit he has been taking testosterone replacement and has seen improvement in strength, endurance, mental function and libido, although he still has low to no volume of ejaculate. He has no other complaints: no major illness, surgery or injury.    The risk factors are reflected in the social history.  The roster of all physicians providing medical care to patient - is listed in the Snapshot section of the chart.  Activities of daily living:  The patient is 100% inedpendent in all ADLs: dressing, toileting, feeding as well as independent mobility  Home safety : The patient has smoke detectors in the home. Falls - house is fall safe, grab rails in the bathroom and with the garage steps.  They wear seatbelts. firearms are present in the home, kept in a safe fashion. There is no violence in the home.   There is no risks for hepatitis, STDs or HIV. There is no history of blood transfusion. They have no travel history to infectious disease endemic areas of the world.  The patient has seen their dentist in the last six month. They have seen their eye doctor in the last year. They admit to hearing difficulty and have had audiologic testing in the last year.  They do not  have excessive sun exposure. Discussed the need for sun protection: hats, long sleeves and use of sunscreen if there is significant sun exposure.   Diet: the importance of a healthy diet is discussed. They do have a healthy diet.  The patient has a regular exercise program: calisthenics, aerobic ,  45 duration, 7 per week.  The benefits of regular aerobic exercise were discussed.  Depression screen: there are no signs or vegative symptoms of depression- irritability, change in appetite,  anhedonia, sadness/tearfullness.  Cognitive assessment: the patient manages all their financial and personal affairs and is actively engaged.   The following portions of the patient's history were reviewed and updated as appropriate: allergies, current medications, past family history, past medical history,  past surgical history, past social history  and problem list.  Vision, hearing, body mass index were assessed and reviewed.   During the course of the visit the patient was educated and counseled about appropriate screening and preventive services including : fall prevention , diabetes screening, nutrition counseling, colorectal cancer screening, and recommended immunizations. . Over the past six months he has seen improvement in strength, muscle tone, cognitive function as well as libido with testosterone replacement. Blood pressure at home is generally less than 140 systolic.   Past Medical History  Diagnosis Date  . AF (atrial fibrillation)     s/p ablation at [facility] 2007  . Neoplasm of uncertain behavior     SKIN  . PMR (polymyalgia rheumatica)     HX OF  . Hyperlipidemia   . Hypothyroidism    Past Surgical History  Procedure Date  . Hand surgery   . Tonsillectomy   . Inguinal hernia repair   . Cataract extraction, bilateral Summer 2009   Family History  Problem Relation Age of Onset  . Emphysema Mother   . Diabetes Mother   . Kidney disease Father    History   Social History  . Marital Status: Married  Spouse Name: Ollin Hochmuth    Number of Children: 3  . Years of Education: N/A   Occupational History  . RETIRED BANKER     Still active w/stocks   Social History Main Topics  . Smoking status: Former Smoker    Quit date: 05/04/1963  . Smokeless tobacco: Never Used  . Alcohol Use: Yes     rare glass of wine  . Drug Use: No  . Sexually Active: Yes -- Male partner(s)   Other Topics Concern  . Not on file   Social History Narrative    Lennie Hummer, [place],  MBA, Scientist, research (life sciences). Married 1958. 2 -girls '59, '62; 1 son '64; 2 grandchildren. Retired Psychologist, occupational. END of LIFE CARE: has a living will, would not want any heroic/futile care.  Wife sustained fracture proximal humerus left - Sept '11     Review of Systems Constitutional:  Negative for fever, chills, activity change and unexpected weight change.  HEENT:  Negative for hearing loss, ear pain, congestion, neck stiffness and postnasal drip. Negative for sore throat or swallowing problems. Negative for dental complaints.   Eyes: Negative for vision loss or change in visual acuity.  Respiratory: Negative for chest tightness and wheezing. Negative for DOE.   Cardiovascular: Negative for chest pain or palpitations. No decreased exercise tolerance Gastrointestinal: No change in bowel habit. No bloating or gas. No reflux or indigestion Genitourinary: Negative for urgency, flank pain and difficulty urinating. Has increased frequency, nocturia. Musculoskeletal: Positive for myalgias, back pain, and gait problem. Glucosamine helps.  Neurological: Negative for dizziness, tremors, weakness and headaches.  Hematological: Negative for adenopathy.  Psychiatric/Behavioral: Negative for behavioral problems and dysphoric mood.       Objective:   Physical Exam Filed Vitals:   07/26/11 0924  BP: 142/72  Pulse: 57  Temp: 97.5 F (36.4 C)  Resp: 14   Wt Readings from Last 3 Encounters:  07/26/11 174 lb 12 oz (79.266 kg)  01/25/11 173 lb (78.472 kg)  07/15/10 178 lb (80.74 kg)   Gen'l: Well nourished well developed white man in no acute distress  HEENT: Head: Normocephalic and atraumatic. Right Ear: External ear normal. EAC/TM nl. Left Ear: External ear normal.  EAC/TM nl. Nose: Nose normal. Mouth/Throat: Oropharynx is clear and moist. Dentition - native, in good repair. No buccal or palatal lesions. Posterior pharynx clear. Eyes: Conjunctivae and sclera clear. EOM  intact. Pupils are equal, round, and reactive to light. Right eye exhibits no discharge. Left eye exhibits no discharge. Neck: Normal range of motion. Neck supple. No JVD present. No tracheal deviation present. No thyromegaly present.  Cardiovascular: Normal rate, regular rhythm with occasional PVCs, no gallop, no friction rub, no murmur heard.      Quiet precordium. 2+ radial and DP pulses . No carotid bruits Pulmonary/Chest: Effort normal. No respiratory distress or increased WOB, no wheezes, no rales. No chest wall deformity or CVAT. Abdominal: Soft. Bowel sounds are normal in all quadrants. He exhibits no distension, no tenderness, no rebound or guarding, No heptosplenomegaly  Genitourinary:  deferred Musculoskeletal: Normal range of motion. He exhibits no edema and no tenderness.       Small and large joints without redness, synovial thickening or deformity. Full range of motion preserved about all small, median and large joints.  Lymphadenopathy:    He has no cervical or supraclavicular adenopathy.  Neurological: He is alert and oriented to person, place, and time. CN II-XII intact. DTRs 2+ and symmetrical biceps, radial and patellar  tendons. Cerebellar function normal with no tremor, rigidity, normal gait and station.  Skin: Skin is warm and dry. No rash noted. No erythema.  Psychiatric: He has a normal mood and affect. His behavior is normal. Thought content normal.   Lab Results  Component Value Date   WBC 5.7 01/03/2008   HGB 16.0 01/03/2008   HCT 46.5 01/03/2008   PLT 165 01/03/2008   GLUCOSE 93 07/26/2011   CHOL 132 07/26/2011   TRIG 56.0 07/26/2011   HDL 45.20 07/26/2011   LDLCALC 76 07/26/2011        ALT 10 07/26/2011   AST 22 07/26/2011        NA 141 07/26/2011   K 5.2* 07/26/2011   CL 105 07/26/2011   CREATININE 1.1 07/26/2011   BUN 24* 07/26/2011   CO2 28 07/26/2011   TSH 1.83 07/26/2011   PSA 3.61 01/09/2007   HGBA1C 5.6 07/19/2008        Sed rate                 10        Testosterone   pending        Assessment & Plan:

## 2011-07-26 NOTE — Assessment & Plan Note (Signed)
Doing well w/o recurrent symptoms. He does work ohard on his physical fitness and this is paying good dividends. Sed rate is normal range.

## 2011-07-26 NOTE — Assessment & Plan Note (Signed)
Follow-up TSH was normal.  Plan - continue present dose of levothyroxine.

## 2011-07-26 NOTE — Assessment & Plan Note (Signed)
BP Readings from Last 3 Encounters:  07/26/11 142/72  01/25/11 134/76  07/15/10 142/78   Good control on present medications. He will continue the same.

## 2011-07-27 LAB — TESTOSTERONE, FREE, TOTAL, SHBG
Testosterone-% Free: 2.4 % (ref 1.6–2.9)
Testosterone: 710.23 ng/dL (ref 250–890)

## 2011-08-01 ENCOUNTER — Encounter: Payer: Self-pay | Admitting: Internal Medicine

## 2011-08-12 ENCOUNTER — Ambulatory Visit (INDEPENDENT_AMBULATORY_CARE_PROVIDER_SITE_OTHER): Payer: Medicare Other | Admitting: *Deleted

## 2011-08-12 DIAGNOSIS — E291 Testicular hypofunction: Secondary | ICD-10-CM | POA: Diagnosis not present

## 2011-08-12 MED ORDER — TESTOSTERONE CYPIONATE 200 MG/ML IM SOLN
400.0000 mg | INTRAMUSCULAR | Status: DC
  Administered 2011-08-12: 400 mg via INTRAMUSCULAR

## 2011-08-26 ENCOUNTER — Other Ambulatory Visit: Payer: Self-pay | Admitting: Internal Medicine

## 2011-09-13 ENCOUNTER — Telehealth: Payer: Self-pay | Admitting: *Deleted

## 2011-09-13 ENCOUNTER — Ambulatory Visit (INDEPENDENT_AMBULATORY_CARE_PROVIDER_SITE_OTHER): Payer: Medicare Other | Admitting: *Deleted

## 2011-09-13 DIAGNOSIS — E291 Testicular hypofunction: Secondary | ICD-10-CM

## 2011-09-13 MED ORDER — PREDNISONE 10 MG PO TABS: 10.0000 mg | ORAL_TABLET | Freq: Every day | ORAL | Status: DC

## 2011-09-13 MED ORDER — TESTOSTERONE CYPIONATE 200 MG/ML IM SOLN
400.0000 mg | INTRAMUSCULAR | Status: DC
  Administered 2011-09-13 – 2013-04-25 (×2): 400 mg via INTRAMUSCULAR

## 2011-09-13 NOTE — Telephone Encounter (Signed)
[name] would like a refill on prednisone 10 mg tablets for flare up of polymyalgia.

## 2011-09-13 NOTE — Telephone Encounter (Signed)
Rx done - eScribed to pharmacy

## 2011-09-22 ENCOUNTER — Other Ambulatory Visit: Payer: Self-pay | Admitting: *Deleted

## 2011-09-22 ENCOUNTER — Other Ambulatory Visit: Payer: Self-pay

## 2011-09-22 DIAGNOSIS — H26499 Other secondary cataract, unspecified eye: Secondary | ICD-10-CM | POA: Diagnosis not present

## 2011-09-22 MED ORDER — ENALAPRIL MALEATE 5 MG PO TABS: 5.0000 mg | ORAL_TABLET | Freq: Every day | ORAL | Status: DC

## 2011-09-22 MED ORDER — ATORVASTATIN CALCIUM 10 MG PO TABS: 10.0000 mg | ORAL_TABLET | Freq: Every day | ORAL | Status: DC

## 2011-09-22 MED ORDER — LEVOTHYROXINE SODIUM 50 MCG PO TABS: 50.0000 ug | ORAL_TABLET | Freq: Every day | ORAL | Status: DC

## 2011-09-22 NOTE — Telephone Encounter (Signed)
Rx refill to [facility]

## 2011-10-14 ENCOUNTER — Ambulatory Visit (INDEPENDENT_AMBULATORY_CARE_PROVIDER_SITE_OTHER): Payer: Medicare Other | Admitting: *Deleted

## 2011-10-14 DIAGNOSIS — E291 Testicular hypofunction: Secondary | ICD-10-CM | POA: Diagnosis not present

## 2011-10-14 MED ORDER — TESTOSTERONE CYPIONATE 200 MG/ML IM SOLN
400.0000 mg | Freq: Once | INTRAMUSCULAR | Status: AC
  Administered 2011-10-14: 400 mg via INTRAMUSCULAR

## 2011-11-15 ENCOUNTER — Ambulatory Visit (INDEPENDENT_AMBULATORY_CARE_PROVIDER_SITE_OTHER): Payer: Medicare Other | Admitting: *Deleted

## 2011-11-15 DIAGNOSIS — E291 Testicular hypofunction: Secondary | ICD-10-CM

## 2011-11-15 MED ORDER — TESTOSTERONE CYPIONATE 100 MG/ML IM SOLN
200.0000 mg | Freq: Once | INTRAMUSCULAR | Status: AC
  Administered 2011-11-15: 200 mg via INTRAMUSCULAR

## 2011-12-13 ENCOUNTER — Other Ambulatory Visit: Payer: Self-pay | Admitting: *Deleted

## 2011-12-13 MED ORDER — SILDENAFIL CITRATE 100 MG PO TABS: ORAL_TABLET | ORAL | Status: DC

## 2011-12-13 NOTE — Telephone Encounter (Signed)
Ok for viagra 100 mg. 1/2 or 1 tablet prn, #6, refill 11. Please add to med list

## 2011-12-13 NOTE — Telephone Encounter (Signed)
PHARMACY REQEUST REFILL ON MEDICATION VIAGRA TAB. IS NOT ON PATIENT MED LIST.Marland KitchenPLEASE ADVISE

## 2011-12-13 NOTE — Telephone Encounter (Signed)
Medication viagra on med list for patient and sent to [facility] home delivery pharmacy

## 2011-12-16 ENCOUNTER — Ambulatory Visit: Payer: Medicare Other | Admitting: Internal Medicine

## 2011-12-16 ENCOUNTER — Ambulatory Visit (INDEPENDENT_AMBULATORY_CARE_PROVIDER_SITE_OTHER): Payer: Medicare Other | Admitting: *Deleted

## 2011-12-16 DIAGNOSIS — E291 Testicular hypofunction: Secondary | ICD-10-CM

## 2011-12-16 MED ORDER — TESTOSTERONE CYPIONATE 200 MG/ML IM SOLN
200.0000 mg | Freq: Once | INTRAMUSCULAR | Status: AC
  Administered 2011-12-16: 200 mg via INTRAMUSCULAR

## 2012-01-18 ENCOUNTER — Ambulatory Visit (INDEPENDENT_AMBULATORY_CARE_PROVIDER_SITE_OTHER): Payer: Medicare Other | Admitting: *Deleted

## 2012-01-18 DIAGNOSIS — E291 Testicular hypofunction: Secondary | ICD-10-CM | POA: Diagnosis not present

## 2012-01-18 MED ORDER — TESTOSTERONE CYPIONATE 200 MG/ML IM SOLN
400.0000 mg | Freq: Once | INTRAMUSCULAR | Status: AC
  Administered 2012-01-18: 400 mg via INTRAMUSCULAR

## 2012-01-25 ENCOUNTER — Encounter: Payer: Self-pay | Admitting: Internal Medicine

## 2012-01-25 ENCOUNTER — Ambulatory Visit (INDEPENDENT_AMBULATORY_CARE_PROVIDER_SITE_OTHER): Payer: Medicare Other | Admitting: Internal Medicine

## 2012-01-25 VITALS — BP 140/82 | HR 60 | Temp 97.1°F | Resp 16 | Wt 168.0 lb

## 2012-01-25 DIAGNOSIS — E785 Hyperlipidemia, unspecified: Secondary | ICD-10-CM

## 2012-01-25 DIAGNOSIS — Z23 Encounter for immunization: Secondary | ICD-10-CM | POA: Diagnosis not present

## 2012-01-25 DIAGNOSIS — Z0000 Encounter for general adult medical examination without abnormal findings: Secondary | ICD-10-CM | POA: Diagnosis not present

## 2012-01-25 DIAGNOSIS — M353 Polymyalgia rheumatica: Secondary | ICD-10-CM

## 2012-01-25 DIAGNOSIS — I10 Essential (primary) hypertension: Secondary | ICD-10-CM

## 2012-01-25 DIAGNOSIS — E039 Hypothyroidism, unspecified: Secondary | ICD-10-CM

## 2012-01-25 DIAGNOSIS — E291 Testicular hypofunction: Secondary | ICD-10-CM

## 2012-01-25 NOTE — Assessment & Plan Note (Signed)
Lab Results  Component Value Date   TSH 1.83 07/26/2011   Due for follow-up March '14

## 2012-01-25 NOTE — Progress Notes (Signed)
Subjective:    Patient ID: [name], male    DOB: 07/13/1925, 78 y.o.   MRN: [id number]  HPI The patient is here for annual Medicare wellness examination and management of other chronic and acute problems. He has been feeling well.   He has had increased urinary frequency and urgency. Decreased force of stream, slow to start, post-void dribble.    The risk factors are reflected in the social history.  The roster of all physicians providing medical care to patient - is listed in the Snapshot section of the chart.  Activities of daily living:  The patient is 100% inedpendent in all ADLs: dressing, toileting, feeding as well as independent mobility  Home safety : The patient has smoke detectors in the home. Fall - home is fall safe: rails on steps. They wear seatbelts.  firearms are present in the home, kept in a safe fashion. There is no violence in the home.   There is no risks for hepatitis, STDs or HIV. There is no   history of blood transfusion. They have no travel history to infectious disease endemic areas of the world.  The patient has seen their dentist in the last six month. They have seen their eye doctor in the last year. They admit to hearing difficulty and have not had audiologic testing in the last year. Does have a hearing aid but does not use it at all times.   They do not  have excessive sun exposure. Discussed the need for sun protection: hats, long sleeves and use of sunscreen if there is significant sun exposure.   Diet: the importance of a healthy diet is discussed. They do have a healthy diet.  The patient has a regular exercise program: calesthenics , 40 min duration, 6 days per week.  The benefits of regular aerobic exercise were discussed.  Depression screen: there are no signs or vegative symptoms of depression- irritability, change in appetite, anhedonia, sadness/tearfullness.  Cognitive assessment: the patient manages all their financial and personal  affairs and is actively engaged. Admits to some recall of names and memory. They could relate day,date,year and events; recalled 3/3 objects at 3 minutes; performed clock-face test normally.  Vision, hearing, body mass index were assessed and reviewed.   During the course of the visit the patient was educated and counseled about appropriate screening and preventive services including : fall prevention , diabetes screening, nutrition counseling, colorectal cancer screening, and recommended immunizations.  Past Medical History  Diagnosis Date  . AF (atrial fibrillation)     s/p ablation at [facility] 2007  . Neoplasm of uncertain behavior     SKIN  . PMR (polymyalgia rheumatica)     HX OF  . Hyperlipidemia   . Hypothyroidism    Past Surgical History  Procedure Date  . Hand surgery   . Tonsillectomy   . Inguinal hernia repair   . Cataract extraction, bilateral Summer 2009   Family History  Problem Relation Age of Onset  . Emphysema Mother   . Diabetes Mother   . Kidney disease Father    History   Social History  . Marital Status: Married    Spouse Name: Egon Dittus    Number of Children: 3  . Years of Education: N/A   Occupational History  . RETIRED BANKER     Still active w/stocks   Social History Main Topics  . Smoking status: Former Smoker    Quit date: 05/04/1963  . Smokeless tobacco: Never Used  .  Alcohol Use: Yes     rare glass of wine  . Drug Use: No  . Sexually Active: Yes -- Male partner(s)   Other Topics Concern  . Not on file   Social History Narrative   Lennie [place], [place],  MBA, Scientist, research (life sciences). Married 1958. 2 -girls '59, '62; 1 son '64; 2 grandchildren. Retired Psychologist, occupational. END of LIFE CARE: has a living will, would not want any heroic/futile care.  Wife sustained fracture proximal humerus left - Sept '11    Current Outpatient Prescriptions on File Prior to Visit  Medication Sig Dispense Refill  . aspirin 325 MG tablet Take  325 mg by mouth daily.        Marland Kitchen atorvastatin (LIPITOR) 10 MG tablet Take 1 tablet (10 mg total) by mouth daily.  90 tablet  3  . carboxymethylcellulose (REFRESH TEARS) 0.5 % SOLN 2-3 drops at bedtime as needed.        . enalapril (VASOTEC) 5 MG tablet Take 1 tablet (5 mg total) by mouth daily.  90 tablet  3  . Glucosa-Chondr-Na Chondr-MSM (GLUCOSAMINE-CHONDROITIN-MSM) [phone] MG TABS Take 1 tablet by mouth daily.        Marland Kitchen levothyroxine (SYNTHROID) 50 MCG tablet Take 1 tablet (50 mcg total) by mouth daily.  90 tablet  3  . multivitamin (THERAGRAN) per tablet Take 1 tablet by mouth daily.        . predniSONE (DELTASONE) 10 MG tablet Take 1 tablet (10 mg total) by mouth daily. For flare of PMR  100 tablet  3  . sildenafil (VIAGRA) 100 MG tablet May take 1/2 to 1 tablet prn  6 tablet  11  . testosterone cypionate (DEPOTESTOTERONE CYPIONATE) 200 MG/ML injection Inject 2 mLs (400 mg total) into the muscle every 28 (twenty-eight) days.  10 mL  1   Current Facility-Administered Medications on File Prior to Visit  Medication Dose Route Frequency Provider Last Rate Last Dose  . testosterone cypionate (DEPOTESTOTERONE CYPIONATE) injection 200 mg  200 mg Intramuscular Q14 Days Jacques Navy, MD   400 mg at 08/26/10 1415  . testosterone cypionate (DEPOTESTOTERONE CYPIONATE) injection 200 mg  200 mg Intramuscular Q28 days Jacques Navy, MD   200 mg at 03/10/11 1440  . testosterone cypionate (DEPOTESTOTERONE CYPIONATE) injection 200 mg  200 mg Intramuscular Q28 days Jacques Navy, MD      . testosterone cypionate (DEPOTESTOTERONE CYPIONATE) injection 200 mg  200 mg Intramuscular Q28 days Jacques Navy, MD   200 mg at 04/12/11 1419  . testosterone cypionate (DEPOTESTOTERONE CYPIONATE) injection 400 mg  400 mg Intramuscular Q28 days Jacques Navy, MD   400 mg at 07/12/11 1400  . testosterone cypionate (DEPOTESTOTERONE CYPIONATE) injection 400 mg  400 mg Intramuscular Q28 days Jacques Navy,  MD      . testosterone cypionate (DEPOTESTOTERONE CYPIONATE) injection 400 mg  400 mg Intramuscular Q28 days Jacques Navy, MD   400 mg at 08/12/11 1558  . testosterone cypionate (DEPOTESTOTERONE CYPIONATE) injection 400 mg  400 mg Intramuscular Q28 days Jacques Navy, MD   400 mg at 09/13/11 1419      Review of Systems Constitutional:  Negative for fever, chills, activity change and unexpected weight change.  HEENT:  Negative for hearing loss, ear pain, congestion, neck stiffness and postnasal drip. Negative for sore throat or swallowing problems. P{ositive for dental complaints infection at tooth implant left mandible.    Eyes: Negative for vision loss or change in  visual acuity.  Respiratory: Negative for chest tightness and wheezing. Negative for DOE.   Cardiovascular: Negative for chest pain or palpitations. No decreased exercise tolerance Gastrointestinal: No change in bowel habit. No bloating or gas. No reflux or indigestion Genitourinary: Negative for urgency, frequency, flank pain and difficulty urinating.  Musculoskeletal: Negative for myalgias, back pain, arthralgias and gait problem. Brief episode of right arm muscle pain - PMR Neurological: Negative for dizziness, tremors, weakness and headaches.  Hematological: Negative for adenopathy.  Psychiatric/Behavioral: Negative for behavioral problems and dysphoric mood.       Objective:   Physical Exam Filed Vitals:   01/25/12 0911  BP: 140/82  Pulse: 60  Temp: 97.1 F (36.2 C)  Resp: 16   Wt Readings from Last 3 Encounters:  01/25/12 168 lb (76.204 kg)  07/26/11 174 lb 12 oz (79.266 kg)  01/25/11 173 lb (78.472 kg)   Gen'l: Well nourished well developed white male in no acute distress  HEENT: Head: Normocephalic and atraumatic. Right Ear: External ear normal. EAC/TM nl. Left Ear: External ear normal.  EAC/TM nl. Nose: Nose normal. Mouth/Throat: Oropharynx is clear and moist. Dentition - native, in good repair. No  buccal or palatal lesions. Posterior pharynx clear. Eyes: Conjunctivae and sclera clear. EOM intact. Pupils are equal, round, and reactive to light. Right eye exhibits no discharge. Left eye exhibits no discharge. Neck: Normal range of motion. Neck supple. No JVD present. No tracheal deviation present. No thyromegaly present.  Cardiovascular: Normal rate, regular rhythm, no gallop, no friction rub, no murmur heard.      Quiet precordium. 2+ radial and DP pulses . No carotid bruits Pulmonary/Chest: Effort normal. No respiratory distress or increased WOB, no wheezes, no rales. No chest wall deformity or CVAT. Abdomen: Soft. Bowel sounds are normal in all quadrants. He exhibits no distension, no tenderness, no rebound or guarding, No heptosplenomegaly  Genitourinary:  deferred Musculoskeletal: Normal range of motion. He exhibits no edema and no tenderness.       Small and large joints without redness, synovial thickening or deformity. Full range of motion preserved about all small, median and large joints.  Lymphadenopathy:    He has no cervical or supraclavicular adenopathy.  Neurological: He is alert and oriented to person, place, and time. CN II-XII intact. DTRs 2+ and symmetrical biceps, radial and patellar tendons. Cerebellar function normal with no tremor, rigidity, normal gait and station.  Skin: Skin is warm and dry. No rash noted. No erythema.  Psychiatric: He has a normal mood and affect. His behavior is normal. Thought content normal.   Lab Results  Component Value Date   WBC 5.7 01/03/2008   HGB 16.0 01/03/2008   HCT 46.5 01/03/2008   PLT 165 01/03/2008   GLUCOSE 93 07/26/2011   CHOL 132 07/26/2011   TRIG 56.0 07/26/2011   HDL 45.20 07/26/2011   LDLCALC 76 07/26/2011   ALT 10 07/26/2011   ALT 10 07/26/2011   AST 22 07/26/2011   AST 22 07/26/2011   NA 141 07/26/2011   K 5.2* 07/26/2011   CL 105 07/26/2011   CREATININE 1.1 07/26/2011   BUN 24* 07/26/2011   CO2 28 07/26/2011   TSH 1.83 07/26/2011     PSA 3.61 01/09/2007   HGBA1C 5.6 07/19/2008          Assessment & Plan:

## 2012-01-25 NOTE — Assessment & Plan Note (Signed)
Last testosteron >700.  Plan - continue replacement therapy

## 2012-01-25 NOTE — Assessment & Plan Note (Signed)
Last lab March '13 revealed excellent control and normal liver functions.  Plan Continue present medication  Repeat lab in March '14

## 2012-01-25 NOTE — Assessment & Plan Note (Signed)
Has had a recent minor flare  He has a supple of prednisone and is instructed to take as a burst and taper when needed for flares of PMR: 30 mg daily x 3 days, 20 mg daily x 3 days and 10 mg daily for 6 days and done.

## 2012-01-25 NOTE — Patient Instructions (Addendum)
Good exam. Full report to follow. Return in March '14 for labs.

## 2012-01-25 NOTE — Assessment & Plan Note (Signed)
BP Readings from Last 3 Encounters:  01/25/12 140/82  07/26/11 142/72  01/25/11 134/76   Adequate control on present medication. Due for labs in March '14

## 2012-01-25 NOTE — Assessment & Plan Note (Signed)
Interval history - normal except for dental infection. Physical exam, sans prostate/genitalia, normal. He is current with colorectal cancer screening and does not require further screening. He has aged out of prostate cancer screening. Immunizations are up to date except he will check on coverage for shingles vaccine.   IN summary - a nice man who is medically stable. He will be due for follow-up lab, lipids, thyroid chemistries, in March '14 and will not need an office visit prior to lab. He will return on an as needed basis, return for lab in March and return, if all goes well, in a year.

## 2012-01-28 ENCOUNTER — Other Ambulatory Visit: Payer: Self-pay | Admitting: Internal Medicine

## 2012-02-17 ENCOUNTER — Ambulatory Visit (INDEPENDENT_AMBULATORY_CARE_PROVIDER_SITE_OTHER): Payer: Medicare Other | Admitting: *Deleted

## 2012-02-17 DIAGNOSIS — E291 Testicular hypofunction: Secondary | ICD-10-CM

## 2012-02-17 MED ORDER — TESTOSTERONE CYPIONATE 200 MG/ML IM SOLN
400.0000 mg | INTRAMUSCULAR | Status: DC
  Administered 2012-02-17: 400 mg via INTRAMUSCULAR

## 2012-03-09 ENCOUNTER — Encounter: Payer: Self-pay | Admitting: Internal Medicine

## 2012-03-09 ENCOUNTER — Ambulatory Visit (INDEPENDENT_AMBULATORY_CARE_PROVIDER_SITE_OTHER): Payer: Medicare Other | Admitting: Internal Medicine

## 2012-03-09 ENCOUNTER — Telehealth: Payer: Self-pay | Admitting: Internal Medicine

## 2012-03-09 VITALS — BP 142/70 | HR 56 | Temp 97.1°F | Resp 16 | Wt 170.0 lb

## 2012-03-09 DIAGNOSIS — R3915 Urgency of urination: Secondary | ICD-10-CM

## 2012-03-09 DIAGNOSIS — R350 Frequency of micturition: Secondary | ICD-10-CM

## 2012-03-09 NOTE — Telephone Encounter (Signed)
Pt Scheduled for this afternoon, thanks!

## 2012-03-09 NOTE — Patient Instructions (Addendum)
Symptoms of urinary frequency and urgency are suggestive of irritable bladder, aka overactive bladder (OAB).   Plan - trial of Vesicare 5 mg once a day. If no improvement in 3-5 days than will need to consider an alternative diagnosis, i.e. BPH with BOO (Benign Prostate hypertrophy with partial Bladder Outlet Obstruction.)  I this works let me know for a Rx.  If this works but there is a problem with excessive dryness there is an alternative product Myrbetriq.    Overactive Bladder, Adult The bladder has two functions that are totally opposite of the other. One is to relax and stretch out so it can store urine (fills like a balloon), and the other is to contract and squeeze down so that it can empty the urine that it has stored. Proper functioning of the bladder is a complex mixing of these two functions. The filling and emptying of the bladder can be influenced by:  The bladder.   The spinal cord.   The brain.   The nerves going to the bladder.   Other organs that are closely related to the bladder such as prostate in males and the vagina in females.  As your bladder fills with urine, nerve signals are sent from the bladder to the brain to tell you that you may need to urinate. Normal urination requires that the bladder squeeze down with sufficient strength to empty the bladder, but this also requires that the bladder squeeze down sufficiently long to finish the job. In addition the sphincter muscles, which normally keep you from leaking urine, must also relax so that the urine can pass. Coordination between the bladder muscle squeezing down and the sphincter muscles relaxing is required to make everything happen normally. With an overactive bladder sometimes the muscles of the bladder contract unexpectedly and involuntarily and this causes an urgent need to urinate. The normal response is to try to hold urine in by contracting the sphincter muscles. Sometimes the bladder contracts so strongly  that the sphincter muscles cannot stop the urine from passing out and incontinence occurs. This kind of incontinence is called urge incontinence. Having an overactive bladder can be embarrassing and awkward. It can keep you from living life the way you want to. Many people think it is just something you have to put up with as you grow older or have certain health conditions. In fact, there are treatments that can help make your life easier and more pleasant. CAUSES   Many things can cause an overactive bladder. Possibilities include:  Urinary tract infection or infection of nearby tissues such as the prostate.   Prostate enlargement.   In women, multiple pregnancies or surgery on the uterus or urethra.   Bladder stones, inflammation or tumors.   Caffeine.   Alcohol.   Medications. For example, diuretics (drugs that help the body get rid of extra fluid) increase urine production. Some other medicines must be taken with lots of fluids.   Muscle or nerve weakness. This might be the result of a spinal cord injury, a stroke, multiple sclerosis or Parkinson's disease.   Diabetes can cause a high urine volume which fills the bladder so quickly that the normal urge to urinate is triggered very strongly.  SYMPTOMS    Loss of bladder control. You feel the need to urinate and cannot make your body wait.   Sudden, strong urges to urinate.   Urinating 8 or more times a day.   Waking up to urinate two or more times a  night.  DIAGNOSIS   To decide if you have overactive bladder, your healthcare provider will probably:  Ask about symptoms you have noticed.   Ask about your overall health. This will include questions about any medications you are taking.   Do a physical examination. This will help determine if there are obvious blockages or other problems.   Order some tests. These might include:   A blood test to check for diabetes or other health issues that could be contributing to the  problem.   Urine testing. This could measure the flow of urine and the pressure on the bladder.   A test of your neurological system (the brain, spinal cord and nerves). This is the system that senses the need to urinate. Some of these tests are called flow tests, bladder pressure tests and electrical measurements of the sphincter muscle.   A bladder test to check whether it is emptying completely when you urinate.   Cytoscopy. This test uses a thin tube with a tiny camera on it. It offers a look inside your urethra and bladder to see if there are problems.   Imaging tests. You might be given a contrast dye and then asked to urinate. X-rays are taken to see how your bladder is working.  TREATMENT   An overactive bladder can be treated in many ways. The treatment will depend on the cause. Whether you have a mild or severe case also makes a difference. Often, treatment can be given in your healthcare provider's office or clinic. Be sure to discuss the different options with your caregiver. They include:  Behavioral treatments. These do not involve medication or surgery:   Bladder training. For this, you would follow a schedule to urinate at regular intervals. This helps you learn to control the urge to urinate. At first, you might be asked to wait a few minutes after feeling the urge. In time, you should be able to schedule bathroom visits an hour or more apart.   Kegel exercises. These exercises strengthen the pelvic floor muscles, which support the bladder. By toning these muscles, they can help control urination, even if the bladder muscles are overactive. A specialist will teach you how to do these exercises correctly. They will require daily practice.   Weight loss. If you are obese or overweight, losing weight might stop your bladder from being overactive. Talk to your healthcare provider about how many pounds you should lose. Also ask if there is a specific program or method that would work  best for you.   Diet change. This might be suggested if constipation is making your overactive bladder worse. Your healthcare provider or a nutritionist can explain ways to change what you eat to ease constipation. Other people might need to take in less caffeine or alcohol. Sometimes drinking fewer fluids is needed, too.   Protection. This is not an actual treatment. But, you could wear special pads to take care of any leakage while you wait for other treatments to take effect. This will help you avoid embarrassment.   Physical treatments.   Electrical stimulation. Electrodes will send gentle pulses to the nerves or muscles that help control the bladder. The goal is to strengthen them. Sometimes this is done with the electrodes outside of the body. Or, they might be placed inside the body (implanted). This treatment can take several months to have an effect.   Medications. These are usually used along with other treatments. Several medicines are available. Some are injected into  the muscles involved in urination. Others come in pill form. Medications sometimes prescribed include:   Anticholinergics. These drugs block the signals that the nerves deliver to the bladder. This keeps it from releasing urine at the wrong time. Researchers think the drugs might help in other ways, too.   Imipramine. This is an antidepressant. But, it relaxes bladder muscles.   Botox. This is still experimental. Some people believe that injecting it into the bladder muscles will relax them so they work more normally. It has also been injected into the sphincter muscle when the sphincter muscle does not open properly. This is a temporary fix, however. Also, it might make matters worse, especially in older people.   Surgery.   A device might be implanted to help manage your nerves. It works on the nerves that signal when you need to urinate.   Surgery is sometimes needed with electrical stimulation. If the electrodes  are implanted, this is done through surgery.   Sometimes repairs need to be made through surgery. For example, the size of the bladder can be changed. This is usually done in severe cases only.  HOME CARE INSTRUCTIONS    Take any medications your healthcare provider prescribed or suggested. Follow the directions carefully.   Practice any lifestyle changes that are recommended. These might include:   Drinking less fluid or drinking at different times of the day. If you need to urinate often during the night, for example, you may need to stop drinking fluids early in the evening.   Cutting down on caffeine or alcohol. They can both make an overactive bladder worse. Caffeine is found in coffee, tea and sodas.   Doing Kegel exercises to strengthen muscles.   Losing weight, if that is recommended.   Eating a healthy and balanced diet. This will help you avoid constipation.   Keep a journal or a log. You might be asked to record how much you drink and when, and also when you feel the need to urinate.   Learn how to care for implants or other devices, such as pessaries.  SEEK MEDICAL CARE IF:    Your overactive bladder gets worse.   You feel increased pain or irritation when you urinate.   You notice blood in your urine.   You have questions about any medications or devices that your healthcare provider recommended.   You notice blood, pus or swelling at the site of any test or treatment procedure.   You have an oral temperature above 102 F (38.9 C).  SEEK IMMEDIATE MEDICAL CARE IF:   You have an oral temperature above 102 F (38.9 C), not controlled by medicine. Document Released: 02/13/2009 Document Revised: 07/12/2011 Document Reviewed: 02/13/2009 [facility] Patient Information 2013 [place], [place].

## 2012-03-09 NOTE — Telephone Encounter (Signed)
Patient calling, has frequent and painful urination.  No blood or fever.  Has no force with the urination.  Has lower abd. pain.    Would like to have a urology referral, states that this is occuring more frequently.   If unable to get same in 24 hours, he is willing to come into the office for OV.

## 2012-03-09 NOTE — Telephone Encounter (Signed)
Unlikely to get an appointment in 24 hours. May be added on today

## 2012-03-12 DIAGNOSIS — R3915 Urgency of urination: Secondary | ICD-10-CM | POA: Insufficient documentation

## 2012-03-12 NOTE — Progress Notes (Signed)
Subjective:    Patient ID: [name], male    DOB: [birth year], 78 y.o.   MRN: [id number]  HPI Mr. Gallen presents for evaluation of increased urinary frequency with urgency. He has had episodes of urgency incontinence. He reports a normal stream, no hesitancy, no post-void dribble. He has nocturia x 1-2. He has had no dysuria, abdominal pain, low back pain or frank hematuria.  PMH, FamHx and SocHx reviewed for any changes and relevance.  Current Outpatient Prescriptions on File Prior to Visit  Medication Sig Dispense Refill  . aspirin 325 MG tablet Take 325 mg by mouth daily.        Marland Kitchen atorvastatin (LIPITOR) 10 MG tablet Take 1 tablet (10 mg total) by mouth daily.  90 tablet  3  . carboxymethylcellulose (REFRESH TEARS) 0.5 % SOLN 2-3 drops at bedtime as needed.        . enalapril (VASOTEC) 5 MG tablet Take 1 tablet (5 mg total) by mouth daily.  90 tablet  3  . Glucosa-Chondr-Na Chondr-MSM (GLUCOSAMINE-CHONDROITIN-MSM) [id number] MG TABS Take 1 tablet by mouth daily.        Marland Kitchen levothyroxine (SYNTHROID) 50 MCG tablet Take 1 tablet (50 mcg total) by mouth daily.  90 tablet  3  . multivitamin (THERAGRAN) per tablet Take 1 tablet by mouth daily.        . predniSONE (DELTASONE) 10 MG tablet Take 1 tablet (10 mg total) by mouth daily. For flare of PMR  100 tablet  3  . sildenafil (VIAGRA) 100 MG tablet May take 1/2 to 1 tablet prn  6 tablet  11  . testosterone cypionate (DEPOTESTOTERONE CYPIONATE) 200 MG/ML injection INJECT 2 MILLILITERS INTRAMUSCURALY EVERY 28 DAYS  10 mL  1   Current Facility-Administered Medications on File Prior to Visit  Medication Dose Route Frequency Provider Last Rate Last Dose  . testosterone cypionate (DEPOTESTOTERONE CYPIONATE) injection 200 mg  200 mg Intramuscular Q14 Days Jacques Navy, MD   400 mg at 08/26/10 1415  . testosterone cypionate (DEPOTESTOTERONE CYPIONATE) injection 200 mg  200 mg Intramuscular Q28 days Jacques Navy, MD   200 mg at 03/10/11  1440  . testosterone cypionate (DEPOTESTOTERONE CYPIONATE) injection 200 mg  200 mg Intramuscular Q28 days Jacques Navy, MD      . testosterone cypionate (DEPOTESTOTERONE CYPIONATE) injection 200 mg  200 mg Intramuscular Q28 days Jacques Navy, MD   200 mg at 04/12/11 1419  . testosterone cypionate (DEPOTESTOTERONE CYPIONATE) injection 400 mg  400 mg Intramuscular Q28 days Jacques Navy, MD   400 mg at 07/12/11 1400  . testosterone cypionate (DEPOTESTOTERONE CYPIONATE) injection 400 mg  400 mg Intramuscular Q28 days Jacques Navy, MD      . testosterone cypionate (DEPOTESTOTERONE CYPIONATE) injection 400 mg  400 mg Intramuscular Q28 days Jacques Navy, MD   400 mg at 08/12/11 1558  . testosterone cypionate (DEPOTESTOTERONE CYPIONATE) injection 400 mg  400 mg Intramuscular Q28 days Jacques Navy, MD   400 mg at 09/13/11 1419      Review of Systems System review is negative for any constitutional, cardiac, pulmonary, GI or neuro symptoms or complaints other than as described in the HPI.     Objective:   Physical Exam Filed Vitals:   03/09/12 1622  BP: 142/70  Pulse: 56  Temp: 97.1 F (36.2 C)  Resp: 16   HEENT - C&S clear Cor- RRR Pulm - normal respirations Neuro - a&O x3  Assessment & Plan:

## 2012-03-12 NOTE — Assessment & Plan Note (Addendum)
Urinary Urgency w/o strong evidence for BPH-BOO but more consistent with OAB  Plan - trial of Vesicare 5 mg once a day. If no improvement in 3-5 days than will need to consider an alternative diagnosis, i.e. BPH with BOO (Benign Prostate hypertrophy with partial Bladder Outlet Obstruction.)  I this works let me know for a Rx.  If this works but there is a problem with excessive dryness there is an alternative product Myrbetriq.  (greater than 50% of 20 min visit spent on education and counseling)

## 2012-03-15 ENCOUNTER — Telehealth: Payer: Self-pay | Admitting: Internal Medicine

## 2012-03-15 NOTE — Telephone Encounter (Signed)
Pt advised, samples in cabinet for pt pick up.  

## 2012-03-15 NOTE — Telephone Encounter (Signed)
Caller: Jakaleb/Patient; Patient Name: [name]; PCP: Illene Regulus (Adults only); Best Callback Phone Number: [phone]. Reports he was seen in office last week by Dr. Debby Bud. He has one pill left to take of the prescription (Vesicare) that he got at that time. Patient was told to send an email to Dr. Debby Bud regarding the amount of times he has urinated. Patient wants to make sure we have received that email. Reviewed EPIC, and did not see any information regarding this. Patient reports: Number of episodes of urination 03/13/12 = 12. Number of episodes of urination on 03/14/12 = 12. Patient is sure he may have left out one or two episodes of urination each day. Patient reports that in the beginning of taking Vesicare he felt like the urgency/frequency was improved, but now he feels like he still has to go frequently. PLEASE CALL PATIENT BACK WITH FURTHER INSTRUCTIONS.

## 2012-03-15 NOTE — Telephone Encounter (Signed)
1. Received no e-mail report yet. 2. Will try rapaflo 8 mg once a day. Samples on counter A

## 2012-03-23 ENCOUNTER — Ambulatory Visit (INDEPENDENT_AMBULATORY_CARE_PROVIDER_SITE_OTHER): Payer: Medicare Other | Admitting: Internal Medicine

## 2012-03-23 DIAGNOSIS — E291 Testicular hypofunction: Secondary | ICD-10-CM | POA: Diagnosis not present

## 2012-03-23 MED ORDER — TESTOSTERONE CYPIONATE 200 MG/ML IM SOLN
400.0000 mg | INTRAMUSCULAR | Status: DC
  Administered 2012-03-23: 400 mg via INTRAMUSCULAR

## 2012-03-27 ENCOUNTER — Telehealth: Payer: Self-pay | Admitting: Internal Medicine

## 2012-03-27 DIAGNOSIS — R3915 Urgency of urination: Secondary | ICD-10-CM

## 2012-03-27 NOTE — Telephone Encounter (Signed)
Called pt and let him know that Dr Debby Bud was putting in a urology referral for further evaluation.

## 2012-03-27 NOTE — Telephone Encounter (Signed)
Will refer to urology for further evaluation. Order in to [facility]

## 2012-03-27 NOTE — Telephone Encounter (Signed)
Caller: Johannes/Patient; Phone: [phone]; Reason for Call: Pt has been on 2 medicaitons to help with urineary frequency and neither have helped.  Bot have made his sinuses and mouth very dry.  What does Dr.  Debby Bud want him to do next?  Offered pt appt but he wants Dr.  To make the decision if he needs to be seen again.

## 2012-03-27 NOTE — Telephone Encounter (Signed)
Please read note below and advise.  

## 2012-04-19 ENCOUNTER — Ambulatory Visit (INDEPENDENT_AMBULATORY_CARE_PROVIDER_SITE_OTHER): Payer: Medicare Other | Admitting: Internal Medicine

## 2012-04-19 ENCOUNTER — Encounter: Payer: Self-pay | Admitting: Internal Medicine

## 2012-04-19 VITALS — BP 150/70 | HR 51 | Temp 97.1°F | Resp 10 | Wt 167.1 lb

## 2012-04-19 DIAGNOSIS — R071 Chest pain on breathing: Secondary | ICD-10-CM

## 2012-04-19 DIAGNOSIS — R0789 Other chest pain: Secondary | ICD-10-CM

## 2012-04-19 MED ORDER — DICLOFENAC SODIUM 1 % TD GEL: 4.0000 g | Freq: Four times a day (QID) | TRANSDERMAL | Status: DC

## 2012-04-19 NOTE — Progress Notes (Signed)
Subjective:    Patient ID: [name], male    DOB: 1925-11-20, 78 y.o.   MRN: [id number]  HPI [name] presents with a 6 day h/o sharp pain at the axillary line. The pain is exacerbated by movement such as sit ups. He doesn't recall any precipitating injury. He has had no fever, chills, n/v.  PMH, FamHx and SocHx reviewed for any changes and relevance.  Current Outpatient Prescriptions on File Prior to Visit  Medication Sig Dispense Refill  . aspirin 325 MG tablet Take 325 mg by mouth daily.        Marland Kitchen atorvastatin (LIPITOR) 10 MG tablet Take 1 tablet (10 mg total) by mouth daily.  90 tablet  3  . carboxymethylcellulose (REFRESH TEARS) 0.5 % SOLN 2-3 drops at bedtime as needed.        . enalapril (VASOTEC) 5 MG tablet Take 1 tablet (5 mg total) by mouth daily.  90 tablet  3  . Glucosa-Chondr-Na Chondr-MSM (GLUCOSAMINE-CHONDROITIN-MSM) [phone] MG TABS Take 1 tablet by mouth daily.        Marland Kitchen levothyroxine (SYNTHROID) 50 MCG tablet Take 1 tablet (50 mcg total) by mouth daily.  90 tablet  3  . multivitamin (THERAGRAN) per tablet Take 1 tablet by mouth daily.        . predniSONE (DELTASONE) 10 MG tablet Take 1 tablet (10 mg total) by mouth daily. For flare of PMR  100 tablet  3  . sildenafil (VIAGRA) 100 MG tablet May take 1/2 to 1 tablet prn  6 tablet  11  . testosterone cypionate (DEPOTESTOTERONE CYPIONATE) 200 MG/ML injection INJECT 2 MILLILITERS INTRAMUSCURALY EVERY 28 DAYS  10 mL  1   Current Facility-Administered Medications on File Prior to Visit  Medication Dose Route Frequency Provider Last Rate Last Dose  . testosterone cypionate (DEPOTESTOTERONE CYPIONATE) injection 200 mg  200 mg Intramuscular Q14 Days Jacques Navy, MD   400 mg at 08/26/10 1415  . testosterone cypionate (DEPOTESTOTERONE CYPIONATE) injection 200 mg  200 mg Intramuscular Q28 days Jacques Navy, MD   200 mg at 03/10/11 1440  . testosterone cypionate (DEPOTESTOTERONE CYPIONATE) injection 200 mg  200 mg  Intramuscular Q28 days Jacques Navy, MD      . testosterone cypionate (DEPOTESTOTERONE CYPIONATE) injection 200 mg  200 mg Intramuscular Q28 days Jacques Navy, MD   200 mg at 04/12/11 1419  . testosterone cypionate (DEPOTESTOTERONE CYPIONATE) injection 400 mg  400 mg Intramuscular Q28 days Jacques Navy, MD   400 mg at 07/12/11 1400  . testosterone cypionate (DEPOTESTOTERONE CYPIONATE) injection 400 mg  400 mg Intramuscular Q28 days Jacques Navy, MD      . testosterone cypionate (DEPOTESTOTERONE CYPIONATE) injection 400 mg  400 mg Intramuscular Q28 days Jacques Navy, MD   400 mg at 08/12/11 1558  . testosterone cypionate (DEPOTESTOTERONE CYPIONATE) injection 400 mg  400 mg Intramuscular Q28 days Jacques Navy, MD   400 mg at 09/13/11 1419      Review of Systems System review is negative for any constitutional, cardiac, pulmonary, GI or neuro symptoms or complaints other than as described in the HPI.     Objective:   Physical Exam Filed Vitals:   04/19/12 1531  BP: 150/70  Pulse: 51  Temp: 97.1 F (36.2 C)  Resp: 10   Gen'l- WNWD man in no distress MSK - point tenderness intercostal space R7-8 at the anterior axillary line right  Assessment & Plan:  Chest wall strain - no internal  Injury  Plan - voltaren gel qid.

## 2012-04-19 NOTE — Patient Instructions (Addendum)
Chest wall pain - no evidence or signs to suggest an internal organ issue, i.e. Stones or kidney disease, etc.  Plan  use voltaren gel topically 4 times a day  Exercise as you want, but if it hurts don't do it.

## 2012-04-24 ENCOUNTER — Ambulatory Visit (INDEPENDENT_AMBULATORY_CARE_PROVIDER_SITE_OTHER): Payer: Medicare Other | Admitting: *Deleted

## 2012-04-24 DIAGNOSIS — E291 Testicular hypofunction: Secondary | ICD-10-CM

## 2012-04-24 MED ORDER — TESTOSTERONE CYPIONATE 200 MG/ML IM SOLN
400.0000 mg | Freq: Once | INTRAMUSCULAR | Status: AC
  Administered 2012-04-24: 400 mg via INTRAMUSCULAR

## 2012-04-25 ENCOUNTER — Ambulatory Visit: Payer: Medicare Other

## 2012-05-12 DIAGNOSIS — R350 Frequency of micturition: Secondary | ICD-10-CM | POA: Diagnosis not present

## 2012-05-12 DIAGNOSIS — N529 Male erectile dysfunction, unspecified: Secondary | ICD-10-CM | POA: Diagnosis not present

## 2012-05-12 DIAGNOSIS — N3941 Urge incontinence: Secondary | ICD-10-CM | POA: Diagnosis not present

## 2012-05-12 DIAGNOSIS — R3915 Urgency of urination: Secondary | ICD-10-CM | POA: Diagnosis not present

## 2012-05-23 ENCOUNTER — Other Ambulatory Visit: Payer: Self-pay | Admitting: *Deleted

## 2012-05-23 ENCOUNTER — Ambulatory Visit (INDEPENDENT_AMBULATORY_CARE_PROVIDER_SITE_OTHER): Payer: Medicare Other | Admitting: *Deleted

## 2012-05-23 DIAGNOSIS — E291 Testicular hypofunction: Secondary | ICD-10-CM | POA: Diagnosis not present

## 2012-05-23 MED ORDER — TESTOSTERONE CYPIONATE 200 MG/ML IM SOLN
200.0000 mg | INTRAMUSCULAR | Status: DC
  Administered 2012-05-23 – 2013-04-25 (×3): 200 mg via INTRAMUSCULAR

## 2012-05-23 MED ORDER — TESTOSTERONE CYPIONATE 200 MG/ML IM SOLN: 200.0000 mg | INTRAMUSCULAR | Status: DC

## 2012-05-23 NOTE — Telephone Encounter (Signed)
Pt would like to have an rx for needles and syringe and have his wife give him the injections. Please advise.

## 2012-05-29 ENCOUNTER — Encounter: Payer: Self-pay | Admitting: Gastroenterology

## 2012-06-19 DIAGNOSIS — R3915 Urgency of urination: Secondary | ICD-10-CM | POA: Diagnosis not present

## 2012-06-20 ENCOUNTER — Ambulatory Visit (INDEPENDENT_AMBULATORY_CARE_PROVIDER_SITE_OTHER): Payer: Medicare Other | Admitting: *Deleted

## 2012-06-20 DIAGNOSIS — E291 Testicular hypofunction: Secondary | ICD-10-CM

## 2012-07-18 ENCOUNTER — Ambulatory Visit (INDEPENDENT_AMBULATORY_CARE_PROVIDER_SITE_OTHER): Payer: Medicare Other

## 2012-07-18 ENCOUNTER — Encounter: Payer: Self-pay | Admitting: Internal Medicine

## 2012-07-18 ENCOUNTER — Ambulatory Visit (INDEPENDENT_AMBULATORY_CARE_PROVIDER_SITE_OTHER): Payer: Medicare Other | Admitting: Internal Medicine

## 2012-07-18 VITALS — BP 182/73 | HR 48 | Ht 69.0 in | Wt 167.4 lb

## 2012-07-18 DIAGNOSIS — Z639 Problem related to primary support group, unspecified: Secondary | ICD-10-CM | POA: Diagnosis not present

## 2012-07-18 DIAGNOSIS — E291 Testicular hypofunction: Secondary | ICD-10-CM

## 2012-07-18 DIAGNOSIS — R002 Palpitations: Secondary | ICD-10-CM | POA: Diagnosis not present

## 2012-07-18 DIAGNOSIS — F439 Reaction to severe stress, unspecified: Secondary | ICD-10-CM

## 2012-07-18 MED ORDER — TESTOSTERONE CYPIONATE 200 MG/ML IM SOLN
100.0000 mg | Freq: Once | INTRAMUSCULAR | Status: AC
  Administered 2012-07-18: 100 mg via INTRAMUSCULAR

## 2012-07-18 MED ORDER — TESTOSTERONE CYPIONATE 200 MG/ML IM SOLN
300.0000 mg | Freq: Once | INTRAMUSCULAR | Status: AC
  Administered 2012-07-18: 300 mg via INTRAMUSCULAR

## 2012-07-18 MED ORDER — TESTOSTERONE CYPIONATE 100 MG/ML IM SOLN
100.0000 mg | Freq: Once | INTRAMUSCULAR | Status: AC
  Administered 2012-07-18: 100 mg via INTRAMUSCULAR

## 2012-07-18 NOTE — Assessment & Plan Note (Signed)
The patient is having post ablation palpitations. We'll undertake an event recorder to clarify the mechanism.

## 2012-07-18 NOTE — Patient Instructions (Signed)
Your physician has recommended that you wear an event monitor. Event monitors are medical devices that record the heart's electrical activity. Doctors most often [place] these monitors to diagnose arrhythmias. Arrhythmias are problems with the speed or rhythm of the heartbeat. The monitor is a small, portable device. You can wear one while you do your normal daily activities. This is usually used to diagnose what is causing palpitations/syncope (passing out).  Your physician recommends that you schedule a follow-up appointment in: 5-6 weeks with Dr. Graciela Husbands.

## 2012-07-18 NOTE — Assessment & Plan Note (Signed)
This remains a major issue as relates to his daughter. She apparently now is hearing voices. I've encouraged him to require her psychiatric evaluation

## 2012-07-18 NOTE — Progress Notes (Signed)
Patient Care Team: Jacques Navy, MD as PCP - General Chucky May, MD (Ophthalmology) Valeria Batman, MD (Orthopedic Surgery) Duke Salvia, MD (Cardiology) Ernestene Mention, MD (General Surgery)   HPI  [name] is a 79 y.o. male Seen after a hiatus of 3 years. We have not seen him for 5 years before then. He has a history of atrial arrhythmias and status post catheter ablation at the [facility] clinic in 2007.  He has known sinus node dysfunction that had not been symptomatic   He has had recently over the last month or so number of episodes of recurrent flutters. These have occurred in the setting of ongoing stress with his daugheter  Past Medical History  Diagnosis Date  . AF (atrial fibrillation)     s/p ablation at [facility] 2007  . Neoplasm of uncertain behavior     SKIN  . PMR (polymyalgia rheumatica)     HX OF  . Hyperlipidemia   . Hypothyroidism     Past Surgical History  Procedure Laterality Date  . Hand surgery    . Tonsillectomy    . Inguinal hernia repair    . Cataract extraction, bilateral  Summer 2009    Current Outpatient Prescriptions  Medication Sig Dispense Refill  . aspirin 325 MG tablet Take 325 mg by mouth daily.        Marland Kitchen atorvastatin (LIPITOR) 10 MG tablet Take 1 tablet (10 mg total) by mouth daily.  90 tablet  3  . carboxymethylcellulose (REFRESH TEARS) 0.5 % SOLN 2-3 drops at bedtime as needed.        . enalapril (VASOTEC) 5 MG tablet Take 1 tablet (5 mg total) by mouth daily.  90 tablet  3  . Glucosa-Chondr-Na Chondr-MSM (GLUCOSAMINE-CHONDROITIN-MSM) [phone] MG TABS Take 1 tablet by mouth daily.        Marland Kitchen levothyroxine (SYNTHROID) 50 MCG tablet Take 1 tablet (50 mcg total) by mouth daily.  90 tablet  3  . multivitamin (THERAGRAN) per tablet Take 1 tablet by mouth daily.        . predniSONE (DELTASONE) 10 MG tablet Take 10 mg by mouth daily as needed. For flare of PMR      . sildenafil (VIAGRA) 100 MG tablet May take  1/2 to 1 tablet prn  6 tablet  11  . tamsulosin (FLOMAX) 0.4 MG CAPS       . testosterone cypionate (DEPOTESTOTERONE CYPIONATE) 200 MG/ML injection Inject 1 mL (200 mg total) into the muscle every 28 (twenty-eight) days.  10 mL  1  . VITAMIN D, ERGOCALCIFEROL, PO Take 1,000 Units by mouth daily.       Current Facility-Administered Medications  Medication Dose Route Frequency Provider Last Rate Last Dose  . testosterone cypionate (DEPOTESTOTERONE CYPIONATE) injection 200 mg  200 mg Intramuscular Q14 Days Jacques Navy, MD   400 mg at 08/26/10 1415  . testosterone cypionate (DEPOTESTOTERONE CYPIONATE) injection 200 mg  200 mg Intramuscular Q28 days Jacques Navy, MD   200 mg at 03/10/11 1440  . testosterone cypionate (DEPOTESTOTERONE CYPIONATE) injection 200 mg  200 mg Intramuscular Q28 days Jacques Navy, MD      . testosterone cypionate (DEPOTESTOTERONE CYPIONATE) injection 200 mg  200 mg Intramuscular Q28 days Jacques Navy, MD   200 mg at 04/12/11 1419  . testosterone cypionate (DEPOTESTOTERONE CYPIONATE) injection 200 mg  200 mg Intramuscular Q28 days Jacques Navy, MD   200 mg at 06/20/12 1055  .  testosterone cypionate (DEPOTESTOTERONE CYPIONATE) injection 400 mg  400 mg Intramuscular Q28 days Jacques Navy, MD   400 mg at 07/12/11 1400  . testosterone cypionate (DEPOTESTOTERONE CYPIONATE) injection 400 mg  400 mg Intramuscular Q28 days Jacques Navy, MD      . testosterone cypionate (DEPOTESTOTERONE CYPIONATE) injection 400 mg  400 mg Intramuscular Q28 days Jacques Navy, MD   400 mg at 08/12/11 1558  . testosterone cypionate (DEPOTESTOTERONE CYPIONATE) injection 400 mg  400 mg Intramuscular Q28 days Jacques Navy, MD   400 mg at 09/13/11 1419    No Known Allergies  Review of Systems negative except from HPI and PMH  Physical Exam BP 182/73  Pulse 48  Ht 5\' 9"  (1.753 m)  Wt 167 lb 6.4 oz (75.932 kg)  BMI 24.71 kg/m2 Well developed and nourished in no acute  distress HENT normal Neck supple with JVP-flat Clear Regular rate and rhythm, no murmurs or gallops Abd-soft with active BS No Clubbing cyanosis edema Skin-warm and dry A & Oriented  Grossly normal sensory and motor function  ECG demonstrates sinus rhythm at 56 Intervals 16/10/42 with occasional PACs  Assessment and  Plan

## 2012-07-26 ENCOUNTER — Ambulatory Visit (INDEPENDENT_AMBULATORY_CARE_PROVIDER_SITE_OTHER): Payer: Medicare Other | Admitting: Internal Medicine

## 2012-07-26 ENCOUNTER — Encounter: Payer: Self-pay | Admitting: Internal Medicine

## 2012-07-26 VITALS — BP 170/80 | HR 58 | Temp 97.1°F | Resp 12 | Ht 69.0 in | Wt 168.8 lb

## 2012-07-26 DIAGNOSIS — M353 Polymyalgia rheumatica: Secondary | ICD-10-CM

## 2012-07-26 DIAGNOSIS — I10 Essential (primary) hypertension: Secondary | ICD-10-CM | POA: Diagnosis not present

## 2012-07-26 DIAGNOSIS — E785 Hyperlipidemia, unspecified: Secondary | ICD-10-CM | POA: Diagnosis not present

## 2012-07-26 DIAGNOSIS — R3915 Urgency of urination: Secondary | ICD-10-CM

## 2012-07-26 DIAGNOSIS — E039 Hypothyroidism, unspecified: Secondary | ICD-10-CM

## 2012-07-26 DIAGNOSIS — Z0000 Encounter for general adult medical examination without abnormal findings: Secondary | ICD-10-CM

## 2012-07-26 NOTE — Progress Notes (Signed)
Subjective:    Patient ID: [name], male    DOB: 1925/10/09, 79 y.o.   MRN: [id number]  HPI The patient is here for annual Medicare wellness examination and management of other chronic and acute problems.  Feeling more age this last year than ever,. He did have some heart fluttering and he has seen Dr. Graciela Husbands. He continues to have problems with his daughter.   He has had some subtle symptoms of possible recurrence of PMR. He is exercising on a regular.   Has an appointment with Dr. Lenoria Chime office for follow up of urinary frequency. He has not seen good results with anticholinergic drugs or myrbetriq, no good results with BPH drugs, e.g. Rapaflo or tamsulosin. May be a candidate for peroneal nerve stimulation.   The risk factors are reflected in the social history.  The roster of all physicians providing medical care to patient - is listed in the Snapshot section of the chart.  Activities of daily living:  The patient is 100% inedpendent in all ADLs: dressing, toileting, feeding as well as independent mobility  Home safety : The patient has smoke detectors in the home. They wear seatbelts. Falls - none. Home is fall safe. firearms are present in the home, kept in a safe fashion. There is no violence in the home.   There is no risks for hepatitis, STDs or HIV. There is no history of blood transfusion. They have no travel history to infectious disease endemic areas of the world.  The patient has seen their dentist in the last six month. They have seen their eye doctor in the last year. They admit to hearing difficulty- high frequency loss and have not had audiologic testing in the last year.    They do not  have excessive sun exposure. Discussed the need for sun protection: hats, long sleeves and use of sunscreen if there is significant sun exposure.   Diet: the importance of a healthy diet is discussed. They do have a healthy  diet.  The patient has a regular exercise program:  calesthenics , 40 min _duration, 7 per week.  The benefits of regular aerobic exercise were discussed.  Depression screen: there are no signs or vegative symptoms of depression- irritability, change in appetite, anhedonia, sadness/tearfullness.  Cognitive assessment: the patient manages all their financial and personal affairs and is actively engaged.  The following portions of the patient's history were reviewed and updated as appropriate: allergies, current medications, past family history, past medical history,  past surgical history, past social history  and problem list.  Vision, hearing, body mass index were assessed and reviewed.   During the course of the visit the patient was educated and counseled about appropriate screening and preventive services including : fall prevention , diabetes screening, nutrition counseling, colorectal cancer screening, and recommended immunizations.    Review of Systems Constitutional:  Negative for fever, chills, activity change and unexpected weight change.  HEENT:  Negative for hearing loss, ear pain, congestion, neck stiffness and postnasal drip. Negative for sore throat or swallowing problems. Negative for dental complaints.   Eyes: Negative for vision loss or change in visual acuity.  Respiratory: Negative for chest tightness and wheezing. Negative for DOE.   Cardiovascular: Negative for chest pain or palpitations. No decreased exercise tolerance Gastrointestinal: No change in bowel habit. No bloating or gas. No reflux or indigestion Genitourinary: Negative for urgency, frequency, flank pain and difficulty urinating.  Musculoskeletal: Negative for myalgias, back pain, arthralgias and gait problem.  Neurological: Negative for dizziness, tremors, weakness and headaches.  Hematological: Negative for adenopathy.  Psychiatric/Behavioral: Negative for behavioral problems and dysphoric mood.       Objective:   Physical Exam Filed Vitals:   07/26/12  1458  BP: 170/80  Pulse: 58  Temp: 97.1 F (36.2 C)  Resp: 12   Wt Readings from Last 3 Encounters:  07/26/12 168 lb 12.8 oz (76.567 kg)  07/18/12 167 lb 6.4 oz (75.932 kg)  04/19/12 167 lb 1.3 oz (75.787 kg)   Gen'l: Well nourished well developed white male in no acute distress  HEENT: Head: Normocephalic and atraumatic. Right Ear: External ear normal. EAC/TM nl. Left Ear: External ear normal.  EAC/TM nl. Nose: Nose normal. Mouth/Throat: Oropharynx is clear and moist. Dentition - native, in good repair. No buccal or palatal lesions. Posterior pharynx clear. Eyes: Conjunctivae and sclera clear. EOM intact. Pupils are equal, round, and reactive to light. Right eye exhibits no discharge. Left eye exhibits no discharge. Neck: Normal range of motion. Neck supple. No JVD present. No tracheal deviation present. No thyromegaly present.  Cardiovascular: Normal rate, regular rhythm, no gallop, no friction rub, no murmur heard.      Quiet precordium. 2+ radial and DP pulses . No carotid bruits Pulmonary/Chest: Effort normal. No respiratory distress or increased WOB, no wheezes, no rales. No chest wall deformity or CVAT. Abdomen: Soft. Bowel sounds are normal in all quadrants. He exhibits no distension, no tenderness, no rebound or guarding, No heptosplenomegaly  Genitourinary:  deferred Musculoskeletal: Normal range of motion. He exhibits no edema and no tenderness.       Small and large joints without redness, synovial thickening or deformity. Full range of motion preserved about all small, median and large joints.  Lymphadenopathy:    He has no cervical or supraclavicular adenopathy.  Neurological: He is alert and oriented to person, place, and time. CN II-XII intact. DTRs 2+ and symmetrical biceps, radial and patellar tendons. Cerebellar function normal with no tremor, rigidity, normal gait and station.  Skin: Skin is warm and dry. No rash noted. No erythema.  Psychiatric: He has a normal mood and  affect. His behavior is normal. Thought content normal.   Lab Results  Component Value Date   WBC 5.7 01/03/2008   HGB 16.0 01/03/2008   HCT 46.5 01/03/2008   PLT 165 01/03/2008   GLUCOSE 92 07/27/2012   CHOL 107 07/27/2012   TRIG 41.0 07/27/2012   HDL 38.20* 07/27/2012   LDLCALC 61 07/27/2012        ALT 9 07/27/2012   AST 19 07/27/2012        NA 136 07/27/2012   K 4.9 07/27/2012   CL 101 07/27/2012   CREATININE 1.2 07/27/2012   BUN 22 07/27/2012   CO2 31 07/27/2012   TSH 1.97 07/27/2012   PSA 3.61 01/09/2007   HGBA1C 5.6 07/19/2008         Assessment & Plan:

## 2012-07-26 NOTE — Patient Instructions (Addendum)
Thanks for coming in.   You look fine. Labs are on order for you.  A full report will follow including labs as well as being available on MyChart

## 2012-07-27 ENCOUNTER — Other Ambulatory Visit (INDEPENDENT_AMBULATORY_CARE_PROVIDER_SITE_OTHER): Payer: Medicare Other

## 2012-07-27 DIAGNOSIS — I10 Essential (primary) hypertension: Secondary | ICD-10-CM

## 2012-07-27 DIAGNOSIS — M353 Polymyalgia rheumatica: Secondary | ICD-10-CM

## 2012-07-27 DIAGNOSIS — E039 Hypothyroidism, unspecified: Secondary | ICD-10-CM

## 2012-07-27 DIAGNOSIS — E785 Hyperlipidemia, unspecified: Secondary | ICD-10-CM

## 2012-07-27 LAB — COMPREHENSIVE METABOLIC PANEL
ALT: 9 U/L (ref 0–53)
Albumin: 4.2 g/dL (ref 3.5–5.2)
BUN: 22 mg/dL (ref 6–23)
CO2: 31 mEq/L (ref 19–32)
Calcium: 9 mg/dL (ref 8.4–10.5)
Chloride: 101 mEq/L (ref 96–112)
Creatinine, Ser: 1.2 mg/dL (ref 0.4–1.5)
GFR: 64.31 mL/min (ref >60.00)
Potassium: 4.9 mEq/L (ref 3.5–5.1)

## 2012-07-27 LAB — HEPATIC FUNCTION PANEL
Albumin: 4.2 g/dL (ref 3.5–5.2)
Alkaline Phosphatase: 49 U/L (ref 39–117)

## 2012-07-27 LAB — LIPID PANEL
Cholesterol: 107 mg/dL (ref 0–200)
Total CHOL/HDL Ratio: 3
Triglycerides: 41 mg/dL (ref 0.0–149.0)

## 2012-07-27 LAB — SEDIMENTATION RATE: Sed Rate: 4 mm/hr (ref 0–22)

## 2012-07-30 DIAGNOSIS — Z0000 Encounter for general adult medical examination without abnormal findings: Secondary | ICD-10-CM | POA: Insufficient documentation

## 2012-07-30 NOTE — Assessment & Plan Note (Signed)
Not much response to either alpha adrenergic or anticholenesterase drugus, e.g. Rapaflo or myrbetriq  Plan Follow up with Dr. Lenoria Chime staff.

## 2012-07-30 NOTE — Assessment & Plan Note (Signed)
BP Readings from Last 3 Encounters:  07/26/12 170/80  07/18/12 182/73  04/19/12 150/70   Elevated at last two office visits.The patient is insistent that his readings at home are much better.  Plan Keep record of Home Blood pressure and report back.

## 2012-07-30 NOTE — Assessment & Plan Note (Signed)
Interval medical history is negative for any major illness, surgery or injury. Chronic problems are generally stable. Physical exam is unremarkable. Latest labs are in normal limits. He is current with colorectal cancer screening and has aged out of prostate screening. Immunizations are up to date.   In summary a very nice man who is medically stable at this time. He will return in 6 months for recheck.

## 2012-07-30 NOTE — Assessment & Plan Note (Signed)
LDL is much better than goal of 80 in a high risk patient. Th HDL is slightly low.   Plan Continue present medications

## 2012-07-30 NOTE — Assessment & Plan Note (Signed)
He is concerned for relapse with som shoulder girdle stiffness and pain.  Plan ESR with recommendations to follow  Addendum: ESR normal. No need for treatment

## 2012-07-30 NOTE — Assessment & Plan Note (Signed)
TSH and FT4 are in normal range  Plan Continue present replacement dose of levothyroxine

## 2012-08-01 ENCOUNTER — Telehealth: Payer: Self-pay | Admitting: *Deleted

## 2012-08-01 ENCOUNTER — Encounter (INDEPENDENT_AMBULATORY_CARE_PROVIDER_SITE_OTHER): Payer: Medicare Other

## 2012-08-01 DIAGNOSIS — R3915 Urgency of urination: Secondary | ICD-10-CM | POA: Diagnosis not present

## 2012-08-01 DIAGNOSIS — R002 Palpitations: Secondary | ICD-10-CM | POA: Diagnosis not present

## 2012-08-01 NOTE — Telephone Encounter (Signed)
30 day event monitor placed on Pt 08/01/12 TK

## 2012-08-07 DIAGNOSIS — H26499 Other secondary cataract, unspecified eye: Secondary | ICD-10-CM | POA: Diagnosis not present

## 2012-08-17 ENCOUNTER — Ambulatory Visit (INDEPENDENT_AMBULATORY_CARE_PROVIDER_SITE_OTHER): Payer: Medicare Other

## 2012-08-17 DIAGNOSIS — E291 Testicular hypofunction: Secondary | ICD-10-CM | POA: Diagnosis not present

## 2012-08-17 MED ORDER — TESTOSTERONE CYPIONATE 100 MG/ML IM SOLN
100.0000 mg | Freq: Once | INTRAMUSCULAR | Status: AC
  Administered 2012-08-17: 400 mg via INTRAMUSCULAR

## 2012-08-25 ENCOUNTER — Encounter: Payer: Self-pay | Admitting: Internal Medicine

## 2012-08-25 ENCOUNTER — Ambulatory Visit (INDEPENDENT_AMBULATORY_CARE_PROVIDER_SITE_OTHER): Payer: Medicare Other | Admitting: Internal Medicine

## 2012-08-25 VITALS — BP 136/70 | HR 52 | Ht 69.0 in | Wt 168.8 lb

## 2012-08-25 DIAGNOSIS — R002 Palpitations: Secondary | ICD-10-CM | POA: Diagnosis not present

## 2012-08-25 NOTE — Progress Notes (Signed)
Patient Care Team: Jacques Navy, MD as PCP - General Chucky May, MD (Ophthalmology) Valeria Batman, MD (Orthopedic Surgery) Duke Salvia, MD (Cardiology) Ernestene Mention, MD (General Surgery)   HPI  [name] is a 79 y.o. male Seen in followup for atrial arrhythmias for which he underwent catheter ablation at [facility] in 2007. We saw him last 3/14 after a hiatus of more than 3 years.  He's had rate current episodes of flutters associated with stress etc. Around his daughter  Past Medical History  Diagnosis Date  . AF (atrial fibrillation)     s/p ablation at [facility] 2007  . Neoplasm of uncertain behavior     SKIN  . PMR (polymyalgia rheumatica)     HX OF  . Hyperlipidemia   . Hypothyroidism     Past Surgical History  Procedure Laterality Date  . Hand surgery    . Tonsillectomy    . Inguinal hernia repair    . Cataract extraction, bilateral  Summer 2009    Current Outpatient Prescriptions  Medication Sig Dispense Refill  . aspirin 325 MG tablet Take 325 mg by mouth daily.        Marland Kitchen atorvastatin (LIPITOR) 10 MG tablet Take 1 tablet (10 mg total) by mouth daily.  90 tablet  3  . carboxymethylcellulose (REFRESH TEARS) 0.5 % SOLN 2-3 drops at bedtime as needed.        . enalapril (VASOTEC) 5 MG tablet Take 1 tablet (5 mg total) by mouth daily.  90 tablet  3  . Glucosa-Chondr-Na Chondr-MSM (GLUCOSAMINE-CHONDROITIN-MSM) [phone] MG TABS Take 1 tablet by mouth daily.        Marland Kitchen levothyroxine (SYNTHROID) 50 MCG tablet Take 1 tablet (50 mcg total) by mouth daily.  90 tablet  3  . Multiple Vitamins-Minerals (CENTRUM SILVER PO) Take by mouth daily.      . multivitamin (THERAGRAN) per tablet Take 1 tablet by mouth daily.        Marland Kitchen MYRBETRIQ 25 MG TB24 25 mg daily.       . predniSONE (DELTASONE) 10 MG tablet Take 10 mg by mouth daily as needed. For flare of PMR      . sildenafil (VIAGRA) 100 MG tablet May take 1/2 to 1 tablet prn  6 tablet  11   . testosterone cypionate (DEPOTESTOTERONE CYPIONATE) 200 MG/ML injection Inject 1 mL (200 mg total) into the muscle every 28 (twenty-eight) days.  10 mL  1  . VITAMIN D, ERGOCALCIFEROL, PO Take 1,000 Units by mouth daily.       Current Facility-Administered Medications  Medication Dose Route Frequency Provider Last Rate Last Dose  . testosterone cypionate (DEPOTESTOTERONE CYPIONATE) injection 200 mg  200 mg Intramuscular Q14 Days Jacques Navy, MD   400 mg at 08/26/10 1415  . testosterone cypionate (DEPOTESTOTERONE CYPIONATE) injection 200 mg  200 mg Intramuscular Q28 days Jacques Navy, MD   200 mg at 03/10/11 1440  . testosterone cypionate (DEPOTESTOTERONE CYPIONATE) injection 200 mg  200 mg Intramuscular Q28 days Jacques Navy, MD      . testosterone cypionate (DEPOTESTOTERONE CYPIONATE) injection 200 mg  200 mg Intramuscular Q28 days Jacques Navy, MD   200 mg at 04/12/11 1419  . testosterone cypionate (DEPOTESTOTERONE CYPIONATE) injection 200 mg  200 mg Intramuscular Q28 days Jacques Navy, MD   200 mg at 06/20/12 1055  . testosterone cypionate (DEPOTESTOTERONE CYPIONATE) injection 400 mg  400 mg Intramuscular Q28 days  Jacques Navy, MD   400 mg at 07/12/11 1400  . testosterone cypionate (DEPOTESTOTERONE CYPIONATE) injection 400 mg  400 mg Intramuscular Q28 days Jacques Navy, MD      . testosterone cypionate (DEPOTESTOTERONE CYPIONATE) injection 400 mg  400 mg Intramuscular Q28 days Jacques Navy, MD   400 mg at 08/12/11 1558  . testosterone cypionate (DEPOTESTOTERONE CYPIONATE) injection 400 mg  400 mg Intramuscular Q28 days Jacques Navy, MD   400 mg at 09/13/11 1419    No Known Allergies  Review of Systems negative except from HPI and PMH  Physical Exam BP 136/70  Pulse 52  Ht 5\' 9"  (1.753 m)  Wt 168 lb 12.8 oz (76.567 kg)  BMI 24.92 kg/m2  SpO2 95% Well developed and well nourished in no acute distress HENT normal E scleral and icterus clear Neck  Supple JVP flat; carotids brisk and full Clear to ausculation right in a couple of aspirin will Regular rate and rhythm, no murmurs gallops or rub Soft with active bowel sounds No clubbing cyanosis no  Edema Alert and oriented, grossly normal motor and sensory function Skin Warm and Dry  Event recorder  pvcs with couplets  Assessment and  Plan

## 2012-08-25 NOTE — Assessment & Plan Note (Signed)
.   Currently on monitor things are quiet home

## 2012-08-25 NOTE — Patient Instructions (Signed)
Your physician wants you to follow-up in: 1 year with Dr Klein.  You will receive a reminder letter in the mail two months in advance. If you don't receive a letter, please call our office to schedule the follow-up appointment.  

## 2012-09-14 ENCOUNTER — Ambulatory Visit (INDEPENDENT_AMBULATORY_CARE_PROVIDER_SITE_OTHER): Payer: Medicare Other

## 2012-09-14 DIAGNOSIS — E291 Testicular hypofunction: Secondary | ICD-10-CM | POA: Diagnosis not present

## 2012-09-14 MED ORDER — TESTOSTERONE CYPIONATE 200 MG/ML IM SOLN
400.0000 mg | Freq: Once | INTRAMUSCULAR | Status: AC
  Administered 2012-09-14: 400 mg via INTRAMUSCULAR

## 2012-09-14 NOTE — Progress Notes (Signed)
Pt supplied his own medication for injection

## 2012-09-14 NOTE — Progress Notes (Signed)
  Subjective:    Patient ID: [name], male    DOB: 08/24/1925, 79 y.o.   MRN: [id number]  HPI    Review of Systems     Objective:   Physical Exam        Assessment & Plan:

## 2012-10-12 ENCOUNTER — Ambulatory Visit (INDEPENDENT_AMBULATORY_CARE_PROVIDER_SITE_OTHER): Payer: Medicare Other

## 2012-10-12 DIAGNOSIS — E291 Testicular hypofunction: Secondary | ICD-10-CM

## 2012-10-12 MED ORDER — TESTOSTERONE CYPIONATE 200 MG/ML IM SOLN
400.0000 mg | Freq: Once | INTRAMUSCULAR | Status: AC
  Administered 2012-10-12: 400 mg via INTRAMUSCULAR

## 2012-11-09 ENCOUNTER — Other Ambulatory Visit: Payer: Self-pay

## 2012-11-09 ENCOUNTER — Ambulatory Visit (INDEPENDENT_AMBULATORY_CARE_PROVIDER_SITE_OTHER): Payer: Medicare Other

## 2012-11-09 DIAGNOSIS — E291 Testicular hypofunction: Secondary | ICD-10-CM | POA: Diagnosis not present

## 2012-11-09 MED ORDER — TESTOSTERONE CYPIONATE 200 MG/ML IM SOLN
400.0000 mg | Freq: Once | INTRAMUSCULAR | Status: AC
  Administered 2012-11-09: 400 mg via INTRAMUSCULAR

## 2012-11-09 MED ORDER — TESTOSTERONE CYPIONATE 200 MG/ML IM SOLN: 400.0000 mg | INTRAMUSCULAR | Status: DC

## 2012-11-09 NOTE — Telephone Encounter (Signed)
Patient comes in today for a testosterone injection and stated he needs a refill.

## 2012-11-09 NOTE — Telephone Encounter (Signed)
Depo testosterone called to pharmacy  

## 2012-11-13 DIAGNOSIS — H26499 Other secondary cataract, unspecified eye: Secondary | ICD-10-CM | POA: Diagnosis not present

## 2012-12-07 ENCOUNTER — Ambulatory Visit (INDEPENDENT_AMBULATORY_CARE_PROVIDER_SITE_OTHER): Payer: Medicare Other

## 2012-12-07 DIAGNOSIS — E291 Testicular hypofunction: Secondary | ICD-10-CM | POA: Diagnosis not present

## 2012-12-07 MED ORDER — TESTOSTERONE CYPIONATE 200 MG/ML IM SOLN
400.0000 mg | Freq: Once | INTRAMUSCULAR | Status: AC
  Administered 2012-12-07: 400 mg via INTRAMUSCULAR

## 2012-12-18 ENCOUNTER — Other Ambulatory Visit: Payer: Self-pay

## 2012-12-18 MED ORDER — LEVOTHYROXINE SODIUM 50 MCG PO TABS: 50.0000 ug | ORAL_TABLET | Freq: Every day | ORAL | Status: DC

## 2012-12-18 MED ORDER — ENALAPRIL MALEATE 5 MG PO TABS: 5.0000 mg | ORAL_TABLET | Freq: Every day | ORAL | Status: DC

## 2012-12-18 MED ORDER — ATORVASTATIN CALCIUM 10 MG PO TABS: 10.0000 mg | ORAL_TABLET | Freq: Every day | ORAL | Status: DC

## 2013-01-04 ENCOUNTER — Ambulatory Visit (INDEPENDENT_AMBULATORY_CARE_PROVIDER_SITE_OTHER): Payer: Medicare Other

## 2013-01-04 DIAGNOSIS — E291 Testicular hypofunction: Secondary | ICD-10-CM

## 2013-01-04 MED ORDER — TESTOSTERONE CYPIONATE 100 MG/ML IM SOLN
400.0000 mg | Freq: Once | INTRAMUSCULAR | Status: AC
  Administered 2013-01-04: 400 mg via INTRAMUSCULAR

## 2013-01-25 ENCOUNTER — Other Ambulatory Visit: Payer: Medicare Other

## 2013-01-25 ENCOUNTER — Encounter: Payer: Self-pay | Admitting: Internal Medicine

## 2013-01-25 ENCOUNTER — Ambulatory Visit (INDEPENDENT_AMBULATORY_CARE_PROVIDER_SITE_OTHER): Payer: Medicare Other | Admitting: Internal Medicine

## 2013-01-25 VITALS — BP 148/84 | HR 46 | Temp 95.8°F | Wt 163.8 lb

## 2013-01-25 DIAGNOSIS — I10 Essential (primary) hypertension: Secondary | ICD-10-CM

## 2013-01-25 DIAGNOSIS — Z23 Encounter for immunization: Secondary | ICD-10-CM

## 2013-01-25 DIAGNOSIS — R3915 Urgency of urination: Secondary | ICD-10-CM | POA: Diagnosis not present

## 2013-01-25 LAB — COMPREHENSIVE METABOLIC PANEL
Albumin: 4.2 g/dL (ref 3.5–5.2)
BUN: 27 mg/dL — ABNORMAL HIGH (ref 6–23)
CO2: 30 mEq/L (ref 19–32)
Calcium: 9.1 mg/dL (ref 8.4–10.5)
Chloride: 103 mEq/L (ref 96–112)
Creatinine, Ser: 1.4 mg/dL (ref 0.4–1.5)
GFR: 50.36 mL/min — ABNORMAL LOW (ref >60.00)
Glucose, Bld: 76 mg/dL (ref 70–99)
Potassium: 4.5 mEq/L (ref 3.5–5.1)

## 2013-01-25 NOTE — Assessment & Plan Note (Signed)
Urinary function - increased frequency, decreased force of stream, incomplete bladder empyting are consistent with partial bladder outlet obstruction due to enlarged prostate.  Plan Trial of Rapaflo - an alpha blocker that "relaxes" the prostate. You should see a better stream, better emptying and decreased freqeuncy in 3-4 days.  If this works - let me know for a prescription for Tamsulosin

## 2013-01-25 NOTE — Patient Instructions (Addendum)
1. Urinary function - increased frequency, decreased force of stream, incomplete bladder empyting are consistent with partial bladder outlet obstruction due to enlarged prostate.  Plan Trial of Rapaflo - an alpha blocker that "relaxes" the prostate. You should see a better stream, better emptying and decreased freqeuncy in 3-4 days.  If this works - let me know for a prescription for Tamsulosin  2. Blood pressure - looks good.   Plan  Continue present medications  Lab today: basic metabolic panel today  3. Health maintenance - not due for cholesterol and liver labs or thyroid labs until march '15  Plan Prevnar - a new and complimentary pneumonia vaccine  Flu shot given.   Keep up the good work with exercise and healthy diet.

## 2013-01-25 NOTE — Assessment & Plan Note (Signed)
Blood pressure - looks good.  BP Readings from Last 3 Encounters:  01/25/13 148/84  08/25/12 136/70  07/26/12 170/80     Plan  Continue present medications  Lab today: basic metabolic panel today

## 2013-01-25 NOTE — Progress Notes (Signed)
Subjective:    Patient ID: [name], male    DOB: 08/08/1925, 79 y.o.   MRN: [id number]  HPI [name] presents for general follow up. He a a brief episode of vertigo several months ago - self limited. He had an episode of low back pain which resolved with heat and rest.,  He does report that he has some increased frequency and incomplete emptying, he has a decreased force of stream. He is getting relief of OAB with myrbetriq.  Past Medical History  Diagnosis Date  . AF (atrial fibrillation)     s/p ablation at [facility] 2007  . Neoplasm of uncertain behavior     SKIN  . PMR (polymyalgia rheumatica)     HX OF  . Hyperlipidemia   . Hypothyroidism    Past Surgical History  Procedure Laterality Date  . Hand surgery    . Tonsillectomy    . Inguinal hernia repair    . Cataract extraction, bilateral  Summer 2009   Family History  Problem Relation Age of Onset  . Emphysema Mother   . Diabetes Mother   . Kidney disease Father    History   Social History  . Marital Status: Married    Spouse Name: Nic Lampe    Number of Children: 3  . Years of Education: 16   Occupational History  . RETIRED BANKER     Still active w/stocks  . RETIRED    Social History Main Topics  . Smoking status: Former Smoker    Quit date: 05/04/1963  . Smokeless tobacco: Never Used  . Alcohol Use: Yes     Comment: rare glass of wine  . Drug Use: No  . Sexual Activity: Yes    Partners: Female   Other Topics Concern  . Not on file   Social History Narrative   Lennie Hummer, [place],  MBA, Scientist, research (life sciences). Married 1958. 2 -girls '59, '62; 1 son '64; 2 grandchildren. Retired Psychologist, occupational. END of LIFE CARE: Yes - DNR, yes - for short term mechanical ventilation.has a living will, would not want any heroic/futile care.  Wife sustained fracture proximal humerus left - Sept '11     Current Outpatient Prescriptions on File Prior to Visit  Medication Sig Dispense Refill  .  aspirin 325 MG tablet Take 325 mg by mouth daily.        Marland Kitchen atorvastatin (LIPITOR) 10 MG tablet Take 1 tablet (10 mg total) by mouth daily.  90 tablet  3  . carboxymethylcellulose (REFRESH TEARS) 0.5 % SOLN 2-3 drops at bedtime as needed.        . enalapril (VASOTEC) 5 MG tablet Take 1 tablet (5 mg total) by mouth daily.  90 tablet  3  . Glucosa-Chondr-Na Chondr-MSM (GLUCOSAMINE-CHONDROITIN-MSM) [id number] MG TABS Take 1 tablet by mouth daily.        Marland Kitchen levothyroxine (SYNTHROID) 50 MCG tablet Take 1 tablet (50 mcg total) by mouth daily.  90 tablet  3  . Multiple Vitamins-Minerals (CENTRUM SILVER PO) Take by mouth daily.      . multivitamin (THERAGRAN) per tablet Take 1 tablet by mouth daily.        Marland Kitchen MYRBETRIQ 25 MG TB24 25 mg daily.       . predniSONE (DELTASONE) 10 MG tablet Take 10 mg by mouth daily as needed. For flare of PMR      . sildenafil (VIAGRA) 100 MG tablet May take 1/2 to 1 tablet prn  6 tablet  11  . testosterone cypionate (DEPOTESTOTERONE CYPIONATE) 200 MG/ML injection Inject 2 mLs (400 mg total) into the muscle every 28 (twenty-eight) days.  10 mL  1  . VITAMIN D, ERGOCALCIFEROL, PO Take 1,000 Units by mouth daily.       Current Facility-Administered Medications on File Prior to Visit  Medication Dose Route Frequency Provider Last Rate Last Dose  . testosterone cypionate (DEPOTESTOTERONE CYPIONATE) injection 200 mg  200 mg Intramuscular Q14 Days Jacques Navy, MD   400 mg at 08/26/10 1415  . testosterone cypionate (DEPOTESTOTERONE CYPIONATE) injection 200 mg  200 mg Intramuscular Q28 days Jacques Navy, MD   200 mg at 03/10/11 1440  . testosterone cypionate (DEPOTESTOTERONE CYPIONATE) injection 200 mg  200 mg Intramuscular Q28 days Jacques Navy, MD      . testosterone cypionate (DEPOTESTOTERONE CYPIONATE) injection 200 mg  200 mg Intramuscular Q28 days Jacques Navy, MD   200 mg at 04/12/11 1419  . testosterone cypionate (DEPOTESTOTERONE CYPIONATE) injection 200 mg   200 mg Intramuscular Q28 days Jacques Navy, MD   200 mg at 06/20/12 1055  . testosterone cypionate (DEPOTESTOTERONE CYPIONATE) injection 400 mg  400 mg Intramuscular Q28 days Jacques Navy, MD   400 mg at 07/12/11 1400  . testosterone cypionate (DEPOTESTOTERONE CYPIONATE) injection 400 mg  400 mg Intramuscular Q28 days Jacques Navy, MD      . testosterone cypionate (DEPOTESTOTERONE CYPIONATE) injection 400 mg  400 mg Intramuscular Q28 days Jacques Navy, MD   400 mg at 08/12/11 1558  . testosterone cypionate (DEPOTESTOTERONE CYPIONATE) injection 400 mg  400 mg Intramuscular Q28 days Jacques Navy, MD   400 mg at 09/13/11 1419       Review of Systems Constitutional:  Negative for fever, chills, activity change and unexpected weight change.  HEENT:  Positive for hearing loss-but w/o change over last 6 months, ear pain, congestion, neck stiffness and postnasal drip. Negative for sore throat or swallowing problems. Negative for dental complaints.   Eyes: Negative for vision loss or change in visual acuity.  Respiratory: Negative for chest tightness and wheezing. Negative for DOE.   Cardiovascular: Negative for chest pain or palpitations. No decreased exercise tolerance Gastrointestinal: No change in bowel habit. No bloating or gas. No reflux or indigestion Genitourinary: Positive for urgency, frequency, negative for flank pain and difficulty urinating.  Musculoskeletal: Negative for myalgias, back pain, arthralgias and gait problem.  Neurological: Negative for dizziness, tremors, weakness and headaches.  Hematological: Negative for adenopathy.  Psychiatric/Behavioral: Negative for behavioral problems and dysphoric mood.        Objective:   Physical Exam Filed Vitals:   01/25/13 0934  BP: 148/84  Pulse: 46  Temp: 95.8 F (35.4 C)   Wt Readings from Last 3 Encounters:  01/25/13 163 lb 12.8 oz (74.299 kg)  08/25/12 168 lb 12.8 oz (76.567 kg)  07/26/12 168 lb 12.8 oz  (76.567 kg)   BP Readings from Last 3 Encounters:  01/25/13 148/84  08/25/12 136/70  07/26/12 170/80   Gen'l- WNWD fit appearing man in no distress HEENT- C&S clear, PERRLA Cor 2+ radial, RRR Pulm - normal respirations, lungs - CTAP Neuro - alert, oriented, normal cognition.         Assessment & Plan:

## 2013-02-01 ENCOUNTER — Ambulatory Visit (INDEPENDENT_AMBULATORY_CARE_PROVIDER_SITE_OTHER): Payer: Medicare Other | Admitting: *Deleted

## 2013-02-01 DIAGNOSIS — E291 Testicular hypofunction: Secondary | ICD-10-CM | POA: Diagnosis not present

## 2013-02-01 MED ORDER — TESTOSTERONE CYPIONATE 200 MG/ML IM SOLN
200.0000 mg | Freq: Once | INTRAMUSCULAR | Status: AC
  Administered 2013-02-01: 400 mg via INTRAMUSCULAR

## 2013-02-12 ENCOUNTER — Telehealth: Payer: Self-pay | Admitting: *Deleted

## 2013-02-12 MED ORDER — SILODOSIN 4 MG PO CAPS: 4.0000 mg | ORAL_CAPSULE | Freq: Every day | ORAL | Status: DC

## 2013-02-12 NOTE — Telephone Encounter (Signed)
Pt called requesting refill on Rapaflo, Rx not on med list.  Please advise

## 2013-02-12 NOTE — Telephone Encounter (Signed)
Started on samples. He has had good results. OK for rapaflo 4mg   1 po qd #30 refill x 11

## 2013-02-15 NOTE — Telephone Encounter (Signed)
Called pt spoke with wife. 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

## 2013-03-01 ENCOUNTER — Ambulatory Visit (INDEPENDENT_AMBULATORY_CARE_PROVIDER_SITE_OTHER): Payer: Medicare Other

## 2013-03-01 ENCOUNTER — Other Ambulatory Visit: Payer: Self-pay

## 2013-03-01 DIAGNOSIS — E291 Testicular hypofunction: Secondary | ICD-10-CM | POA: Diagnosis not present

## 2013-03-01 MED ORDER — TESTOSTERONE CYPIONATE 100 MG/ML IM SOLN
400.0000 mg | Freq: Once | INTRAMUSCULAR | Status: AC
  Administered 2013-03-01: 400 mg via INTRAMUSCULAR

## 2013-03-01 MED ORDER — TESTOSTERONE CYPIONATE 200 MG/ML IM SOLN: 400.0000 mg | INTRAMUSCULAR | Status: DC

## 2013-03-13 ENCOUNTER — Other Ambulatory Visit: Payer: Self-pay | Admitting: Internal Medicine

## 2013-03-30 ENCOUNTER — Ambulatory Visit (INDEPENDENT_AMBULATORY_CARE_PROVIDER_SITE_OTHER): Payer: Medicare Other

## 2013-03-30 DIAGNOSIS — E291 Testicular hypofunction: Secondary | ICD-10-CM | POA: Diagnosis not present

## 2013-03-30 MED ORDER — TESTOSTERONE CYPIONATE 100 MG/ML IM SOLN
400.0000 mg | Freq: Once | INTRAMUSCULAR | Status: AC
  Administered 2013-03-30: 400 mg via INTRAMUSCULAR

## 2013-04-25 ENCOUNTER — Ambulatory Visit (INDEPENDENT_AMBULATORY_CARE_PROVIDER_SITE_OTHER): Payer: Medicare Other | Admitting: General Practice

## 2013-04-25 DIAGNOSIS — E291 Testicular hypofunction: Secondary | ICD-10-CM

## 2013-05-25 ENCOUNTER — Telehealth: Payer: Self-pay

## 2013-05-25 ENCOUNTER — Ambulatory Visit (INDEPENDENT_AMBULATORY_CARE_PROVIDER_SITE_OTHER): Payer: Medicare Other

## 2013-05-25 DIAGNOSIS — E291 Testicular hypofunction: Secondary | ICD-10-CM | POA: Diagnosis not present

## 2013-05-25 MED ORDER — TESTOSTERONE CYPIONATE 100 MG/ML IM SOLN
400.0000 mg | Freq: Once | INTRAMUSCULAR | Status: AC
  Administered 2013-05-25: 400 mg via INTRAMUSCULAR

## 2013-05-25 NOTE — Telephone Encounter (Signed)
Phone call to patient letting him know the Rapaflo cap 4 mg will no longer be covered. Per Dr Linda Hedges, check with patient to see if he is okay with Tamsulosin. Patient states he was supplied a temporary supply and has an appt on February 13 with the urologist and will discuss then medication because he feels he taking to many meds.

## 2013-05-31 ENCOUNTER — Telehealth: Payer: Self-pay | Admitting: Internal Medicine

## 2013-05-31 NOTE — Telephone Encounter (Signed)
Patient is requesting a referral to [facility].  Patient had dropped off an envelope with a letter in regards. Gave to assistant.  TP

## 2013-05-31 NOTE — Telephone Encounter (Signed)
Got it. Will complete paperwork Sunday PM

## 2013-06-04 ENCOUNTER — Other Ambulatory Visit: Payer: Self-pay | Admitting: Internal Medicine

## 2013-06-04 DIAGNOSIS — H9190 Unspecified hearing loss, unspecified ear: Secondary | ICD-10-CM

## 2013-06-15 ENCOUNTER — Telehealth: Payer: Self-pay | Admitting: Cardiovascular Disease

## 2013-06-15 DIAGNOSIS — N529 Male erectile dysfunction, unspecified: Secondary | ICD-10-CM | POA: Diagnosis not present

## 2013-06-15 DIAGNOSIS — R350 Frequency of micturition: Secondary | ICD-10-CM | POA: Diagnosis not present

## 2013-06-15 DIAGNOSIS — N3941 Urge incontinence: Secondary | ICD-10-CM | POA: Diagnosis not present

## 2013-06-15 NOTE — Telephone Encounter (Signed)
Tami Lin, I received a phone call today from [facility] (Dr. Diona Fanti). Mr. Howat was there for f/u and noted to be tachycardic but asymptomatic. BP was ok. Question of whether or not he was in atrial fibrillation. I told Dr. Diona Fanti that we would call the patient next week to have him come by our office for an EKG and will have you make plans for f/u. Gerald Stabs

## 2013-06-18 ENCOUNTER — Ambulatory Visit (INDEPENDENT_AMBULATORY_CARE_PROVIDER_SITE_OTHER): Payer: Medicare Other | Admitting: *Deleted

## 2013-06-18 ENCOUNTER — Telehealth: Payer: Self-pay | Admitting: Internal Medicine

## 2013-06-18 ENCOUNTER — Other Ambulatory Visit (INDEPENDENT_AMBULATORY_CARE_PROVIDER_SITE_OTHER): Payer: Medicare Other

## 2013-06-18 ENCOUNTER — Encounter: Payer: Self-pay | Admitting: Interventional Cardiology

## 2013-06-18 ENCOUNTER — Encounter: Payer: Self-pay | Admitting: *Deleted

## 2013-06-18 VITALS — BP 158/108 | HR 124 | Ht 69.0 in | Wt 163.5 lb

## 2013-06-18 DIAGNOSIS — R002 Palpitations: Secondary | ICD-10-CM | POA: Diagnosis not present

## 2013-06-18 DIAGNOSIS — I10 Essential (primary) hypertension: Secondary | ICD-10-CM | POA: Diagnosis not present

## 2013-06-18 LAB — CBC WITH DIFFERENTIAL/PLATELET
BASOS ABS: 0 10*3/uL (ref 0.0–0.1)
Basophils Relative: 0.4 % (ref 0.0–3.0)
Eosinophils Absolute: 0.1 10*3/uL (ref 0.0–0.7)
Eosinophils Relative: 2 % (ref 0.0–5.0)
HCT: 52.3 % — ABNORMAL HIGH (ref 39.0–52.0)
Hemoglobin: 17.1 g/dL — ABNORMAL HIGH (ref 13.0–17.0)
LYMPHS PCT: 12.1 % (ref 12.0–46.0)
Lymphs Abs: 0.7 10*3/uL (ref 0.7–4.0)
MCHC: 32.8 g/dL (ref 30.0–36.0)
MCV: 93.8 fl (ref 78.0–100.0)
MONOS PCT: 9.9 % (ref 3.0–12.0)
Monocytes Absolute: 0.6 10*3/uL (ref 0.1–1.0)
NEUTROS PCT: 75.6 % (ref 43.0–77.0)
Neutro Abs: 4.6 10*3/uL (ref 1.4–7.7)
PLATELETS: 187 10*3/uL (ref 150.0–400.0)
RBC: 5.58 Mil/uL (ref 4.22–5.81)
RDW: 13.7 % (ref 11.5–14.6)
WBC: 6.1 10*3/uL (ref 4.5–10.5)

## 2013-06-18 LAB — BASIC METABOLIC PANEL
BUN: 28 mg/dL — ABNORMAL HIGH (ref 6–23)
CHLORIDE: 104 meq/L (ref 96–112)
CO2: 30 mEq/L (ref 19–32)
Calcium: 9 mg/dL (ref 8.4–10.5)
Creatinine, Ser: 1.3 mg/dL (ref 0.4–1.5)
GFR: 53.79 mL/min — ABNORMAL LOW (ref >60.00)
Glucose, Bld: 86 mg/dL (ref 70–99)
Potassium: 5 mEq/L (ref 3.5–5.1)
Sodium: 140 mEq/L (ref 135–145)

## 2013-06-18 LAB — TSH: TSH: 2.8 u[IU]/mL (ref 0.35–5.50)

## 2013-06-18 MED ORDER — METOPROLOL SUCCINATE ER 50 MG PO TB24: 50.0000 mg | ORAL_TABLET | Freq: Every day | ORAL | Status: DC

## 2013-06-18 NOTE — Telephone Encounter (Signed)
New message     Pulse rate is 125 since Friday--normally 46-50----need advise

## 2013-06-18 NOTE — Patient Instructions (Signed)
Your physician has recommended you make the following change in your medication:  Pt to start Metoprolol 50 mg once a day. Your physician recommends that you return for lab work in: pt to have lab work today BMET, CBC w/diff, TSH Pt has an appointment with Richardson Dopp PA on Tuesday  06/26/13 at 10:10 AM.  Pt to call the office if needed.

## 2013-06-18 NOTE — Telephone Encounter (Signed)
Pt states since the last Friday his HR has been 125 beats/minute. He is SOB with activity. Pt   had an ablation for atrial Fibrillation 7 years ago . He thinks is  A-Fib. Pt is scheduled for a nurse visit  for an EKG at 10:00 AM pt aware.

## 2013-06-18 NOTE — Progress Notes (Signed)
Pt called this AM because he has been with a high heart rate of 124-130 beats/minute some SOB with activity. Pt has a history of A-fibrillation . Pt had an ablation  Seven years ago. An EKG was done per RN and read per Dr. Tamala Julian MD. Sinus tachycardia VS A-flutter with 2:1 AV block,  Rate 124 beats/minute BP 158/105 . Dr Tamala Julian recommends for pt to start Metoprolol succinate 50 mg once a day; prescription sent to [facility]. Pt to  have blood work CBC w/diff BMET and TSH today. Pt is to F/U in a week in this office. Pt has an appointment with Richardson Dopp PA on Tuesday 06/26/13 at 10:10 AM Pt is aware of MD's recommendations. Written instructions given.

## 2013-06-20 ENCOUNTER — Telehealth: Payer: Self-pay

## 2013-06-20 NOTE — Telephone Encounter (Signed)
Message copied by Lamar Laundry on Wed Jun 20, 2013  4:13 PM ------      Message from: Daneen Schick      Created: Wed Jun 20, 2013 11:34 AM       Labs are unremarkable. I note he was unable to get appt. Until 2/24. We need to move this up even if I need to see him tomorrow. ------

## 2013-06-20 NOTE — Telephone Encounter (Signed)
pt given lab results.Labs are unremarkable. pt appt moved up to 06/21/13 @9am  per Dr.Smith.pt verbalized understanding.

## 2013-06-21 ENCOUNTER — Inpatient Hospital Stay (HOSPITAL_COMMUNITY): Payer: Medicare Other

## 2013-06-21 ENCOUNTER — Other Ambulatory Visit: Payer: Self-pay | Admitting: Interventional Cardiology

## 2013-06-21 ENCOUNTER — Encounter: Payer: Self-pay | Admitting: Interventional Cardiology

## 2013-06-21 ENCOUNTER — Encounter (HOSPITAL_COMMUNITY): Payer: Self-pay | Admitting: Interventional Cardiology

## 2013-06-21 ENCOUNTER — Ambulatory Visit (INDEPENDENT_AMBULATORY_CARE_PROVIDER_SITE_OTHER): Payer: Medicare Other | Admitting: Interventional Cardiology

## 2013-06-21 ENCOUNTER — Ambulatory Visit: Payer: Medicare Other

## 2013-06-21 ENCOUNTER — Observation Stay (HOSPITAL_COMMUNITY)
Admission: AD | Admit: 2013-06-21 | Discharge: 2013-06-22 | Disposition: A | Discharge status: Home / Self Care | Payer: Medicare Other | Source: Ambulatory Visit | Attending: Internal Medicine | Admitting: Internal Medicine

## 2013-06-21 VITALS — BP 160/110 | Ht 69.0 in | Wt 164.0 lb

## 2013-06-21 DIAGNOSIS — I5031 Acute diastolic (congestive) heart failure: Secondary | ICD-10-CM

## 2013-06-21 DIAGNOSIS — E039 Hypothyroidism, unspecified: Secondary | ICD-10-CM | POA: Diagnosis present

## 2013-06-21 DIAGNOSIS — I4892 Unspecified atrial flutter: Secondary | ICD-10-CM

## 2013-06-21 DIAGNOSIS — I319 Disease of pericardium, unspecified: Secondary | ICD-10-CM | POA: Diagnosis not present

## 2013-06-21 DIAGNOSIS — I10 Essential (primary) hypertension: Secondary | ICD-10-CM | POA: Diagnosis not present

## 2013-06-21 DIAGNOSIS — M353 Polymyalgia rheumatica: Secondary | ICD-10-CM | POA: Diagnosis not present

## 2013-06-21 DIAGNOSIS — E785 Hyperlipidemia, unspecified: Secondary | ICD-10-CM | POA: Diagnosis present

## 2013-06-21 DIAGNOSIS — I509 Heart failure, unspecified: Secondary | ICD-10-CM | POA: Diagnosis not present

## 2013-06-21 DIAGNOSIS — I5021 Acute systolic (congestive) heart failure: Secondary | ICD-10-CM | POA: Diagnosis not present

## 2013-06-21 DIAGNOSIS — Z87891 Personal history of nicotine dependence: Secondary | ICD-10-CM | POA: Diagnosis not present

## 2013-06-21 HISTORY — DX: Chronic systolic (congestive) heart failure: I50.22

## 2013-06-21 HISTORY — DX: Unspecified osteoarthritis, unspecified site: M19.90

## 2013-06-21 HISTORY — DX: Polymyalgia rheumatica: M35.3

## 2013-06-21 HISTORY — DX: Essential (primary) hypertension: I10

## 2013-06-21 HISTORY — DX: Unspecified atrial flutter: I48.92

## 2013-06-21 LAB — COMPREHENSIVE METABOLIC PANEL
ALBUMIN: 4 g/dL (ref 3.5–5.2)
ALT: 15 U/L (ref 0–53)
AST: 32 U/L (ref 0–37)
Alkaline Phosphatase: 64 U/L (ref 39–117)
BUN: 38 mg/dL — ABNORMAL HIGH (ref 6–23)
CALCIUM: 9.4 mg/dL (ref 8.4–10.5)
CO2: 27 mEq/L (ref 19–32)
Chloride: 101 mEq/L (ref 96–112)
Creatinine, Ser: 1.35 mg/dL (ref 0.50–1.35)
GFR calc Af Amer: 54 mL/min — ABNORMAL LOW (ref >90)
GFR, EST NON AFRICAN AMERICAN: 46 mL/min — AB (ref >90)
Glucose, Bld: 103 mg/dL — ABNORMAL HIGH (ref 70–99)
Potassium: 5.2 mEq/L (ref 3.7–5.3)
Sodium: 138 mEq/L (ref 137–147)
Total Bilirubin: 1 mg/dL (ref 0.3–1.2)
Total Protein: 7.3 g/dL (ref 6.0–8.3)

## 2013-06-21 LAB — CBC WITH DIFFERENTIAL/PLATELET
BASOS ABS: 0 10*3/uL (ref 0.0–0.1)
BASOS PCT: 0 % (ref 0–1)
Eosinophils Absolute: 0.1 10*3/uL (ref 0.0–0.7)
Eosinophils Relative: 1 % (ref 0–5)
HEMATOCRIT: 50.6 % (ref 39.0–52.0)
Hemoglobin: 16.9 g/dL (ref 13.0–17.0)
Lymphocytes Relative: 13 % (ref 12–46)
Lymphs Abs: 0.9 10*3/uL (ref 0.7–4.0)
MCH: 31.2 pg (ref 26.0–34.0)
MCHC: 33.4 g/dL (ref 30.0–36.0)
MCV: 93.4 fL (ref 78.0–100.0)
MONO ABS: 0.7 10*3/uL (ref 0.1–1.0)
Monocytes Relative: 9 % (ref 3–12)
Neutro Abs: 5.4 10*3/uL (ref 1.7–7.7)
Neutrophils Relative %: 76 % (ref 43–77)
PLATELETS: 167 10*3/uL (ref 150–400)
RBC: 5.42 MIL/uL (ref 4.22–5.81)
RDW: 13.7 % (ref 11.5–15.5)
WBC: 7.1 10*3/uL (ref 4.0–10.5)

## 2013-06-21 LAB — PROTIME-INR
INR: 1.16 (ref 0.00–1.49)
PROTHROMBIN TIME: 14.6 s (ref 11.6–15.2)

## 2013-06-21 LAB — TROPONIN I: Troponin I: <0.3 ng/mL (ref <0.30)

## 2013-06-21 LAB — HEPARIN LEVEL (UNFRACTIONATED): HEPARIN UNFRACTIONATED: 0.4 [IU]/mL (ref 0.30–0.70)

## 2013-06-21 LAB — MAGNESIUM: Magnesium: 2.2 mg/dL (ref 1.5–2.5)

## 2013-06-21 LAB — APTT: APTT: 188 s — AB (ref 24–37)

## 2013-06-21 LAB — PRO B NATRIURETIC PEPTIDE: PRO B NATRI PEPTIDE: 1594 pg/mL — AB (ref 0–450)

## 2013-06-21 MED ORDER — ADULT MULTIVITAMIN W/MINERALS CH
1.0000 | ORAL_TABLET | Freq: Every day | ORAL | Status: DC
  Filled 2013-06-21: qty 1

## 2013-06-21 MED ORDER — TESTOSTERONE CYPIONATE 200 MG/ML IM SOLN: 400.0000 mg | INTRAMUSCULAR | Status: DC

## 2013-06-21 MED ORDER — ACETAMINOPHEN 325 MG PO TABS: 650.0000 mg | ORAL_TABLET | ORAL | Status: DC | PRN

## 2013-06-21 MED ORDER — HEPARIN BOLUS VIA INFUSION
3000.0000 [IU] | Freq: Once | INTRAVENOUS | Status: DC
  Filled 2013-06-21: qty 3000

## 2013-06-21 MED ORDER — ENALAPRIL MALEATE 5 MG PO TABS
5.0000 mg | ORAL_TABLET | Freq: Every day | ORAL | Status: DC
  Filled 2013-06-21: qty 1

## 2013-06-21 MED ORDER — SODIUM CHLORIDE 0.9 % IV SOLN: INTRAVENOUS | Status: DC

## 2013-06-21 MED ORDER — METOPROLOL SUCCINATE ER 50 MG PO TB24
50.0000 mg | ORAL_TABLET | Freq: Every day | ORAL | Status: DC
  Administered 2013-06-22: 50 mg via ORAL
  Filled 2013-06-21: qty 1

## 2013-06-21 MED ORDER — CARBOXYMETHYLCELLULOSE SODIUM 0.5 % OP SOLN
2.0000 [drp] | Freq: Every day | OPHTHALMIC | Status: DC | PRN
Start: 2013-06-21 — End: 2013-06-21

## 2013-06-21 MED ORDER — MULTIVITAMINS PO TABS: 1.0000 | ORAL_TABLET | Freq: Every day | ORAL | Status: DC

## 2013-06-21 MED ORDER — PREDNISONE 10 MG PO TABS: 10.0000 mg | ORAL_TABLET | Freq: Every day | ORAL | Status: DC

## 2013-06-21 MED ORDER — MIRABEGRON ER 25 MG PO TB24
25.0000 mg | ORAL_TABLET | Freq: Every day | ORAL | Status: DC
  Administered 2013-06-22: 25 mg via ORAL
  Filled 2013-06-21: qty 1

## 2013-06-21 MED ORDER — SODIUM CHLORIDE 0.9 % IV SOLN
INTRAVENOUS | Status: DC
  Administered 2013-06-22: 15:00:00 via INTRAVENOUS

## 2013-06-21 MED ORDER — LEVOTHYROXINE SODIUM 50 MCG PO TABS
50.0000 ug | ORAL_TABLET | Freq: Every day | ORAL | Status: DC
  Administered 2013-06-22: 50 ug via ORAL
  Filled 2013-06-21 (×2): qty 1

## 2013-06-21 MED ORDER — ASPIRIN 325 MG PO TABS
325.0000 mg | ORAL_TABLET | Freq: Every day | ORAL | Status: DC
  Filled 2013-06-21: qty 1

## 2013-06-21 MED ORDER — ATORVASTATIN CALCIUM 10 MG PO TABS
10.0000 mg | ORAL_TABLET | Freq: Every day | ORAL | Status: DC
  Filled 2013-06-21: qty 1

## 2013-06-21 MED ORDER — METOPROLOL TARTRATE 25 MG PO TABS: 25.0000 mg | ORAL_TABLET | Freq: Two times a day (BID) | ORAL | Status: DC

## 2013-06-21 MED ORDER — HEPARIN (PORCINE) IN NACL 100-0.45 UNIT/ML-% IJ SOLN
1200.0000 [IU]/h | INTRAMUSCULAR | Status: DC
  Administered 2013-06-21: 1050 [IU]/h via INTRAVENOUS
  Administered 2013-06-22 (×2): 1200 [IU]/h via INTRAVENOUS
  Filled 2013-06-21 (×2): qty 250

## 2013-06-21 MED ORDER — SODIUM CHLORIDE 0.9 % IV SOLN: 250.0000 mL | INTRAVENOUS | Status: DC

## 2013-06-21 MED ORDER — SODIUM CHLORIDE 0.9 % IV SOLN
INTRAVENOUS | Status: DC
  Administered 2013-06-21: 17:00:00 via INTRAVENOUS
  Administered 2013-06-22: 500 mL via INTRAVENOUS

## 2013-06-21 MED ORDER — SODIUM CHLORIDE 0.9 % IJ SOLN: 3.0000 mL | INTRAMUSCULAR | Status: DC | PRN

## 2013-06-21 MED ORDER — POLYVINYL ALCOHOL 1.4 % OP SOLN
1.0000 [drp] | Freq: Three times a day (TID) | OPHTHALMIC | Status: DC | PRN
  Filled 2013-06-21: qty 15

## 2013-06-21 MED ORDER — ONDANSETRON HCL 4 MG/2ML IJ SOLN: 4.0000 mg | Freq: Four times a day (QID) | INTRAMUSCULAR | Status: DC | PRN

## 2013-06-21 MED ORDER — AMIODARONE HCL 200 MG PO TABS
400.0000 mg | ORAL_TABLET | Freq: Two times a day (BID) | ORAL | Status: DC
  Administered 2013-06-21 (×2): 400 mg via ORAL
  Filled 2013-06-21 (×4): qty 2

## 2013-06-21 MED ORDER — METOPROLOL TARTRATE 1 MG/ML IV SOLN: 2.5000 mg | INTRAVENOUS | Status: AC

## 2013-06-21 MED ORDER — GLUCOSAMINE-CHONDROITIN-MSM 500-400-422-83 MG PO TABS: 1.0000 | ORAL_TABLET | Freq: Every day | ORAL | Status: DC

## 2013-06-21 MED ORDER — CENTRUM SILVER PO TABS: ORAL_TABLET | Freq: Every morning | ORAL | Status: DC

## 2013-06-21 MED ORDER — SODIUM CHLORIDE 0.9 % IJ SOLN: 3.0000 mL | Freq: Two times a day (BID) | INTRAMUSCULAR | Status: DC

## 2013-06-21 NOTE — Progress Notes (Signed)
ANTICOAGULATION CONSULT NOTE - Initial Consult  Pharmacy Consult for Heparin Indication: atrial fibrillation  No Known Allergies  Patient Measurements:   Heparin Dosing Weight: 74.4 kg  Vital Signs: Temp: 98 F (36.7 C) (02/19 1142) Temp src: Oral (02/19 1142) BP: 144/107 mmHg (02/19 1142) Pulse Rate: 119 (02/19 1142)  Labs: No results found for this basename: HGB, HCT, PLT, APTT, LABPROT, INR, HEPARINUNFRC, CREATININE, CKTOTAL, CKMB, TROPONINI,  in the last 72 hours  The CrCl is unknown because both a height and weight (above a minimum accepted value) are required for this calculation.   Medical History: Past Medical History  Diagnosis Date  . AF (atrial fibrillation)     s/p ablation at [facility] 2007  . Neoplasm of uncertain behavior     SKIN  . PMR (polymyalgia rheumatica)     HX OF  . Hyperlipidemia   . Hypothyroidism   . HYPERTENSION, BENIGN 08/21/2009    Qualifier: Diagnosis of  By: Caryl Comes, MD, Remus Blake  Medications(s)  enalapril   . Polymyalgia rheumatica 01/13/2007    Qualifier: History of  By: Danny Lawless CMA, [place]       Medications:  Scheduled:  . amiodarone  400 mg Oral BID  . aspirin  325 mg Oral Daily  . atorvastatin  10 mg Oral Daily  . CENTRUM SILVER   Oral q morning - 10a  . enalapril  5 mg Oral Daily  . Glucosamine-Chondroitin-MSM  1 tablet Oral Daily  . heparin  3,000 Units Intravenous Once  . levothyroxine  50 mcg Oral Daily  . metoprolol  2.5 mg Intravenous Q5 min  . metoprolol succinate  50 mg Oral Daily  . metoprolol tartrate  25 mg Oral BID  . mirabegron ER  25 mg Oral Daily  . multivitamin  1 tablet Oral Daily  . [START ON 06/22/2013] predniSONE  10 mg Oral Q breakfast  . testosterone cypionate  400 mg Intramuscular Q28 days    Assessment: 80 yr male admitted with aflutter, RVR.  Pharmacy asked to begin anticoagulation with IV heparin, planning for possible TEE/ cardioversion.  No anticoagulants PTA.  Patient says he  was on Coumadin in the past, but it's been at least 8-10 yrs ago.  Interested in [place] if he is to be discharged on anticoagulation, but says he'll "do what ever we tell him is best."  Goal of Therapy:  Heparin level 0.3-0.7 units/ml Monitor platelets by anticoagulation protocol: Yes   Plan:  1. Start IV heparin with small bolus of 3000 units IV x 1. 2. Then start heparin gtt at 1050 units/hr. 3. Check heparin level 8 hrs after gtt starts. 4. Daily heparin level and CBC. 5. F/u plans for oral anticoagulation.  [name], BCPS  Clinical Pharmacist Pager [phone]  06/21/2013 12:34 PM

## 2013-06-21 NOTE — Progress Notes (Signed)
  Echocardiogram 2D Echocardiogram has been performed.  [name] 06/21/2013, 1:21 PM

## 2013-06-21 NOTE — H&P (Signed)
[name] is a 80 y.o. male  Admit Date:06/21/2013  Referring Physician: Dr. Adella Mccoy Primary Cardiologist:: [name], M.D.  Chief complaint / reason for admission: Atrial flutter with rapid ventricular response  HPI: [name] is 80 years of age and has a history of atrial fibrillation and prior ablation at the [facility] greater than 7 years ago. Over the past 2 weeks he has noted exertional dyspnea. 7 days ago during a urology appointment, they noted an elevated heart rate of near 125 beats per minute. The patient called in earlier this week and was brought in for an electrocardiogram. The electrocardiogram was compatible with atrial flutter with 2:1  AV conduction. Metoprolol succinate 50 mg was started. He was scheduled to see Dr. Caryl Mccoy but not until 06/26/13. I asked him to come in earlier just to check and make sure he felt okay. During the office visit he did note exertional dyspnea and a desire to get his heart back and rhythm as soon as possible. I spoke with Dr. Lovena Mccoy about the patient. After discussion with the patient we have decided to admit him to the hospital and get him set up for a TEE cardioversion. He denies angina, orthopnea, PND, lower extremity swelling, neurological complaints, and any recent bleeding.    PMH:    Past Medical History  Diagnosis Date  . AF (atrial fibrillation)     s/p ablation at [facility] 2007  . Neoplasm of uncertain behavior     SKIN  . PMR (polymyalgia rheumatica)     HX OF  . Hyperlipidemia   . Hypothyroidism   . HYPERTENSION, BENIGN 08/21/2009    Qualifier: Diagnosis of  By: Gabriel Comes, MD, [name]  Medications(s)  enalapril   . Polymyalgia rheumatica 01/13/2007    Qualifier: History of  By: [name], [name]       PSH:    Past Surgical History  Procedure Laterality Date  . Hand surgery    . Tonsillectomy    . Inguinal hernia repair    . Cataract extraction, bilateral  Summer 2009    ALLERGIES:   Review of patient's allergies indicates no known allergies. Prior to Admit Meds:   (Not in a hospital admission) Family HX:    Family History  Problem Relation Age of Onset  . Emphysema Mother   . Diabetes Mother   . Kidney disease Father    Social HX:    History   Social History  . Marital Status: Married    Spouse Name: [name]    Number of Children: 3  . Years of Education: 16   Occupational History  . RETIRED BANKER     Still active w/stocks  . RETIRED    Social History Main Topics  . Smoking status: Former Smoker    Quit date: 05/04/1963  . Smokeless tobacco: Never Used  . Alcohol Use: Yes     Comment: rare glass of wine  . Drug Use: No  . Sexual Activity: Yes    Partners: Female   Other Topics Concern  . Not on file   Social History Narrative   [name], [place],  MBA, Chiropodist. Married 1958. 2 -girls '59, '62; 1 son '64; 2 grandchildren. Retired Customer service manager. END of LIFE CARE: Yes - DNR, yes - for short term mechanical ventilation.has a living will, would not want any heroic/futile care.  Wife sustained fracture proximal humerus left - Sept '11  ROS : Denies neurological complaints. No episodes of syncope, claudication, orthopnea, PND, edema, or chest pain  Physical Exam:  BP 160/100, heart rate 120 beats per minute, respirations 16 and nonlabored   The patient is in no acute distress. He appears than his stated age. He was able to walk into the office without distress. The skin is clear to observation. No nail bed cyanosis is noted. No jaundice is observed. The HEENT exam is unremarkable. There is no jaundice or pallor. The chest is clear to auscultation and percussion. The cardiac exam reveals a rapid rhythm. No obvious murmurs heard. No gallop is heard. PMI is not palpable. Abdomen is soft. Bowel sounds are normal. No pulsatile masses or bruits are appreciated. Extremities reveal no edema. Pulses are 2+ and  symmetric in the upper and lower extremities. The neurological exam is on remarkable. No focal motor or sensory deficits noted .  Labs: Lab Results  Component Value Date   WBC 6.1 06/18/2013   HGB 17.1* 06/18/2013   HCT 52.3* 06/18/2013   MCV 93.8 06/18/2013   PLT 187.0 06/18/2013     Recent Labs Lab 06/18/13 1120  NA 140  K 5.0  CL 104  CO2 30  BUN 28*  CREATININE 1.3  CALCIUM 9.0  GLUCOSE 86      Radiology:  Pending  EKG:  Atrial flutter (left atrial) had an atrial rate of 240 beats per minute with a ventricular response of 120 beats per minute. No acute ST-T wave changes noted  ASSESSMENT:  1. Atrial flutter with 2:1 AV conduction at a ventricular rate of 120 beats per minute. Duration is unknown but possibly present for greater than 10 days. 2. Exertional dyspnea likely secondary to acute diastolic heart failure 3. Hypertension, poorly controlled 4. History of atrial fibrillation, with prior ablation performed at the [facility] greater than 7 years ago  Plan:  1. The patient will be admitted to the hospital to manage his current problem including atrial flutter which will likely need TEE guided cardioversion. I requested that consideration be given to performing the procedure within the next 24 hours.  2. Start anticoagulation therapy 3. Tighter blood pressure control will be instituted 4. Chronic anticoagulation therapy will be instituted 5. 2-D Doppler echocardiogram to assess current LV function. Last LVEF by echo in 2007 was 45-50% 6. Chest x-ray and BNP level will be obtained 7. The patient will be admitted to the electrophysiology service  [name] 06/21/2013 12:08 PM

## 2013-06-21 NOTE — Progress Notes (Signed)
Patient ID: [name], male   DOB: [birth year], 80 y.o.   MRN: [id number]    [address], [place]  [address] Phone: [phone] Fax:  [phone]  Date:  06/21/2013   ID:  [name], DOB 1925/08/04, MRN [id number]  PCP:  Adella Hare, MD   ASSESSMENT:  1. Atrial flutter with 2:1 AV conduction 2. History of atrial fibrillation ablation  PLAN:  1. Admitted to the hospital   SUBJECTIVE: [name] is a 80 y.o. male with a two-week history of exertional dyspnea. Recent urology appointment identified a rapid heart rate. EKG done earlier in this week identified a heart rate of 125 beats per minute and suspected atrial flutter. He started metoprolol 50 mg daily and asked him to come in today for followup. His heart rate is still fast. He is ambulatory. There no symptoms at rest. He denies chest pain. He is anxious to get his heart back in rhythm so that his exertional tolerance will improve.   Wt Readings from Last 3 Encounters:  06/21/13 164 lb (74.39 kg)  06/18/13 163 lb 8 oz (74.163 kg)  01/25/13 163 lb 12.8 oz (74.299 kg)     Past Medical History  Diagnosis Date  . AF (atrial fibrillation)     s/p ablation at [facility] 2007  . Neoplasm of uncertain behavior     SKIN  . PMR (polymyalgia rheumatica)     HX OF  . Hyperlipidemia   . Hypothyroidism     Current Outpatient Prescriptions  Medication Sig Dispense Refill  . aspirin 325 MG tablet Take 325 mg by mouth daily.        Marland Kitchen atorvastatin (LIPITOR) 10 MG tablet Take 1 tablet (10 mg total) by mouth daily.  90 tablet  3  . carboxymethylcellulose (REFRESH TEARS) 0.5 % SOLN 2-3 drops at bedtime as needed.        . enalapril (VASOTEC) 5 MG tablet Take 1 tablet (5 mg total) by mouth daily.  90 tablet  3  . Glucosa-Chondr-Na Chondr-MSM (GLUCOSAMINE-CHONDROITIN-MSM) [id number] MG TABS Take 1 tablet by mouth daily.        Marland Kitchen levothyroxine (SYNTHROID) 50 MCG tablet Take 1 tablet (50 mcg  total) by mouth daily.  90 tablet  3  . metoprolol succinate (TOPROL XL) 50 MG 24 hr tablet Take 1 tablet (50 mg total) by mouth daily. Take with or immediately following a meal.  30 tablet  3  . Multiple Vitamins-Minerals (CENTRUM SILVER PO) Take by mouth daily.      . multivitamin (THERAGRAN) per tablet Take 1 tablet by mouth daily.        Marland Kitchen MYRBETRIQ 25 MG TB24 25 mg daily.       . predniSONE (DELTASONE) 10 MG tablet Take 10 mg by mouth daily as needed. For flare of PMR      . silodosin (RAPAFLO) 4 MG CAPS capsule Take 1 capsule (4 mg total) by mouth daily with breakfast.  90 capsule  3  . testosterone cypionate (DEPOTESTOTERONE CYPIONATE) 200 MG/ML injection Inject 2 mLs (400 mg total) into the muscle every 28 (twenty-eight) days.  10 mL  1  . VIAGRA 100 MG tablet TAKE 1/2 TO 1 TABLET BY MOUTH AS NEEDED -Do NOT take with nitrates or within 4 hours of an alpha BLOCKER  4 tablet  10  . VITAMIN D, ERGOCALCIFEROL, PO Take 1,000 Units by mouth daily.       Current  Facility-Administered Medications  Medication Dose Route Frequency Provider Last Rate Last Dose  . testosterone cypionate (DEPOTESTOTERONE CYPIONATE) injection 200 mg  200 mg Intramuscular Q14 Days Neena Rhymes, MD   400 mg at 08/26/10 1415  . testosterone cypionate (DEPOTESTOTERONE CYPIONATE) injection 200 mg  200 mg Intramuscular Q28 days Neena Rhymes, MD   200 mg at 03/10/11 1440  . testosterone cypionate (DEPOTESTOTERONE CYPIONATE) injection 200 mg  200 mg Intramuscular Q28 days Neena Rhymes, MD      . testosterone cypionate (DEPOTESTOTERONE CYPIONATE) injection 200 mg  200 mg Intramuscular Q28 days Neena Rhymes, MD   200 mg at 04/12/11 1419  . testosterone cypionate (DEPOTESTOTERONE CYPIONATE) injection 200 mg  200 mg Intramuscular Q28 days Neena Rhymes, MD   200 mg at 04/25/13 1043  . testosterone cypionate (DEPOTESTOTERONE CYPIONATE) injection 400 mg  400 mg Intramuscular Q28 days Neena Rhymes, MD   400 mg at  07/12/11 1400  . testosterone cypionate (DEPOTESTOTERONE CYPIONATE) injection 400 mg  400 mg Intramuscular Q28 days Neena Rhymes, MD      . testosterone cypionate (DEPOTESTOTERONE CYPIONATE) injection 400 mg  400 mg Intramuscular Q28 days Neena Rhymes, MD   400 mg at 08/12/11 1558  . testosterone cypionate (DEPOTESTOTERONE CYPIONATE) injection 400 mg  400 mg Intramuscular Q28 days Neena Rhymes, MD   400 mg at 04/25/13 1121    Allergies:   No Known Allergies  Social History:  The patient  reports that he quit smoking about 50 years ago. He has never used smokeless tobacco. He reports that he drinks alcohol. He reports that he does not use illicit drugs.   ROS:  Please see the history of present illness.   No neurological complaints   All other systems reviewed and negative.   OBJECTIVE: VS:  Ht 5\' 9"  (1.753 m)  Wt 164 lb (74.39 kg)  BMI 24.21 kg/m2 Well nourished, well developed, in no acute distress, appearing younger than his stated age 39: normal Neck: JVD flat. Carotid bruit absent  Cardiac:  normal S1, S2; RRR; no murmur. Regular and tachycardic Lungs:  clear to auscultation bilaterally, no wheezing, rhonchi or rales Abd: soft, nontender, no hepatomegaly Ext: Edema absent. Pulses 2+ Skin: warm and dry Neuro:  CNs 2-12 intact, no focal abnormalities noted  EKG:  Left atrial flutter with 2:1   AV conduction and heart rate 120 beats per minute   Signed, Illene Labrador III, MD 06/21/2013 9:08 AM

## 2013-06-21 NOTE — Progress Notes (Addendum)
ANTICOAGULATION CONSULT NOTE - Follow Up Consult  Pharmacy Consult for Heparin  Indication: atrial fibrillation  No Known Allergies  Patient Measurements: Heparin Dosing Weight: 74.4 kg  Vital Signs: Temp: 98.2 F (36.8 C) (02/19 2100) Temp src: Oral (02/19 2100) BP: 152/95 mmHg (02/19 2100) Pulse Rate: 103 (02/19 2100)  Labs:  Recent Labs  06/21/13 1615 06/21/13 1641 06/21/13 1918 06/21/13 2245  HGB  --  16.9  --   --   HCT  --  50.6  --   --   PLT  --  167  --   --   APTT  --  188*  --   --   LABPROT  --  14.6  --   --   INR  --  1.16  --   --   HEPARINUNFRC  --   --   --  0.40  CREATININE  --  1.35  --   --   TROPONINI <0.30  --  <0.30  --     The CrCl is unknown because both a height and weight (above a minimum accepted value) are required for this calculation.   Medications:  Heparin 1050 units/hr  Assessment: 80 y/o M on heparin for aflutter/RVR, first HL is 0.40, other labs as above.   Goal of Therapy:  Heparin level 0.3-0.7 units/ml Monitor platelets by anticoagulation protocol: Yes   Plan:  -Continue heparin at 1050 units/hr -F/U HL with AM labs  -Daily CBC/HL -Monitor for bleeding  Narda Bonds 06/21/2013,11:51 PM  Addendum 4:40 AM Repeat HL this AM is down slightly to 0.26 -Will increase heparin drip to 1200 units/hr -Check 8 hour HL at 1300

## 2013-06-21 NOTE — Patient Instructions (Signed)
You are being admitted to [facility]. Please report to admitting (The Main Entrance). (2 [place])

## 2013-06-21 NOTE — Progress Notes (Signed)
For TEE/DCCV 06/22/13 at 1400.

## 2013-06-22 ENCOUNTER — Encounter (HOSPITAL_COMMUNITY): Payer: Self-pay | Admitting: Nurse Practitioner

## 2013-06-22 ENCOUNTER — Encounter (HOSPITAL_COMMUNITY): Payer: Medicare Other | Admitting: Critical Care Medicine

## 2013-06-22 ENCOUNTER — Ambulatory Visit: Payer: Medicare Other

## 2013-06-22 ENCOUNTER — Encounter (HOSPITAL_COMMUNITY)
Admission: AD | Disposition: A | Discharge status: Home / Self Care | Payer: Medicare Other | Source: Ambulatory Visit | Attending: Internal Medicine

## 2013-06-22 ENCOUNTER — Inpatient Hospital Stay (HOSPITAL_COMMUNITY): Payer: Medicare Other | Admitting: Critical Care Medicine

## 2013-06-22 DIAGNOSIS — I509 Heart failure, unspecified: Secondary | ICD-10-CM | POA: Diagnosis not present

## 2013-06-22 DIAGNOSIS — I10 Essential (primary) hypertension: Secondary | ICD-10-CM

## 2013-06-22 DIAGNOSIS — E785 Hyperlipidemia, unspecified: Secondary | ICD-10-CM | POA: Diagnosis not present

## 2013-06-22 DIAGNOSIS — I059 Rheumatic mitral valve disease, unspecified: Secondary | ICD-10-CM | POA: Diagnosis not present

## 2013-06-22 DIAGNOSIS — I5021 Acute systolic (congestive) heart failure: Secondary | ICD-10-CM

## 2013-06-22 DIAGNOSIS — E039 Hypothyroidism, unspecified: Secondary | ICD-10-CM | POA: Diagnosis not present

## 2013-06-22 DIAGNOSIS — I4892 Unspecified atrial flutter: Secondary | ICD-10-CM | POA: Diagnosis not present

## 2013-06-22 HISTORY — PX: TEE WITHOUT CARDIOVERSION: SHX5443

## 2013-06-22 HISTORY — PX: CARDIOVERSION: SHX1299

## 2013-06-22 LAB — CBC
HCT: 47.7 % (ref 39.0–52.0)
Hemoglobin: 16.3 g/dL (ref 13.0–17.0)
MCH: 31.5 pg (ref 26.0–34.0)
MCHC: 34.2 g/dL (ref 30.0–36.0)
MCV: 92.3 fL (ref 78.0–100.0)
PLATELETS: 162 10*3/uL (ref 150–400)
RBC: 5.17 MIL/uL (ref 4.22–5.81)
RDW: 13.6 % (ref 11.5–15.5)
WBC: 6 10*3/uL (ref 4.0–10.5)

## 2013-06-22 LAB — TROPONIN I: Troponin I: <0.3 ng/mL (ref <0.30)

## 2013-06-22 LAB — T4, FREE: FREE T4: 1.21 ng/dL (ref 0.80–1.80)

## 2013-06-22 LAB — TSH: TSH: 2.944 u[IU]/mL (ref 0.350–4.500)

## 2013-06-22 LAB — HEPARIN LEVEL (UNFRACTIONATED): Heparin Unfractionated: 0.26 IU/mL — ABNORMAL LOW (ref 0.30–0.70)

## 2013-06-22 SURGERY — ECHOCARDIOGRAM, TRANSESOPHAGEAL
Anesthesia: Monitor Anesthesia Care

## 2013-06-22 MED ORDER — ENALAPRIL MALEATE 10 MG PO TABS
10.0000 mg | ORAL_TABLET | Freq: Every day | ORAL | Status: DC
  Administered 2013-06-22: 10 mg via ORAL
  Filled 2013-06-22: qty 1

## 2013-06-22 MED ORDER — MIDAZOLAM HCL 10 MG/2ML IJ SOLN
INTRAMUSCULAR | Status: DC | PRN
  Administered 2013-06-22: 1 mg via INTRAVENOUS
  Administered 2013-06-22: 2 mg via INTRAVENOUS
  Administered 2013-06-22: 1 mg via INTRAVENOUS

## 2013-06-22 MED ORDER — PATIENT'S GUIDE TO USING COUMADIN BOOK
Freq: Once | Status: AC
  Administered 2013-06-22: 12:00:00
  Filled 2013-06-22: qty 1

## 2013-06-22 MED ORDER — ENALAPRIL MALEATE 10 MG PO TABS: 10.0000 mg | ORAL_TABLET | Freq: Every day | ORAL | Status: DC

## 2013-06-22 MED ORDER — MIDAZOLAM HCL 5 MG/ML IJ SOLN
INTRAMUSCULAR | Status: AC
  Filled 2013-06-22: qty 2

## 2013-06-22 MED ORDER — ENOXAPARIN SODIUM 80 MG/0.8ML ~~LOC~~ SOLN
70.0000 mg | Freq: Two times a day (BID) | SUBCUTANEOUS | Status: DC
  Administered 2013-06-22: 70 mg via SUBCUTANEOUS
  Filled 2013-06-22 (×2): qty 0.8

## 2013-06-22 MED ORDER — WARFARIN - PHARMACIST DOSING INPATIENT: Freq: Every day | Status: DC

## 2013-06-22 MED ORDER — WARFARIN SODIUM 5 MG PO TABS
5.0000 mg | ORAL_TABLET | Freq: Once | ORAL | Status: AC
  Administered 2013-06-22: 5 mg via ORAL
  Filled 2013-06-22: qty 1

## 2013-06-22 MED ORDER — FENTANYL CITRATE 0.05 MG/ML IJ SOLN
INTRAMUSCULAR | Status: AC
  Filled 2013-06-22: qty 2

## 2013-06-22 MED ORDER — WARFARIN SODIUM 5 MG PO TABS: 5.0000 mg | ORAL_TABLET | Freq: Every day | ORAL | Status: DC

## 2013-06-22 MED ORDER — ENOXAPARIN SODIUM 80 MG/0.8ML ~~LOC~~ SOLN: 70.0000 mg | Freq: Two times a day (BID) | SUBCUTANEOUS | Status: DC

## 2013-06-22 MED ORDER — WARFARIN VIDEO
Freq: Once | Status: AC
  Administered 2013-06-22: 12:00:00

## 2013-06-22 MED ORDER — FENTANYL CITRATE 0.05 MG/ML IJ SOLN
INTRAMUSCULAR | Status: DC | PRN
Start: 2013-06-22 — End: 2013-06-22
  Administered 2013-06-22 (×2): 25 ug via INTRAVENOUS

## 2013-06-22 MED ORDER — PROPOFOL 10 MG/ML IV BOLUS
INTRAVENOUS | Status: DC | PRN
  Administered 2013-06-22: 30 mg via INTRAVENOUS

## 2013-06-22 MED ORDER — BUTAMBEN-TETRACAINE-BENZOCAINE 2-2-14 % EX AERO
INHALATION_SPRAY | CUTANEOUS | Status: DC | PRN
  Administered 2013-06-22: 2 via TOPICAL

## 2013-06-22 NOTE — Transfer of Care (Signed)
Immediate Anesthesia Transfer of Care Note  Patient: [name]  Procedure(s) Performed: Procedure(s) with comments: TRANSESOPHAGEAL ECHOCARDIOGRAM (TEE) (N/A) CARDIOVERSION (N/A) - 15:17 Lido 20mg ,IV , Propofol 30 mg, IV synched cardioversion @ 150 joules by Dr. Lajoyce Lauber from a flutter to sinis brady which is baseline for this pt, reports 45-50 reg HR verified by Dr. Ashok Norris  Patient Location: Endoscopy Unit  Anesthesia Type:MAC  Level of Consciousness: awake, alert  and oriented  Airway & Oxygen Therapy: Patient Spontanous Breathing and Patient connected to nasal cannula oxygen  Post-op Assessment: Report given to PACU RN, Post -op Vital signs reviewed and stable and Patient moving all extremities X 4  Post vital signs: Reviewed and stable  Complications: No apparent anesthesia complications

## 2013-06-22 NOTE — Discharge Instructions (Signed)
Information on my medicine - Coumadin   (Warfarin)  This medication education was reviewed with me or my healthcare representative as part of my discharge preparation.  The pharmacist that spoke with me during my hospital stay was:  Uvaldo Rising, BCPS 06/22/2013 12:54 PM   Why was Coumadin prescribed for you? Coumadin was prescribed for you because you have a blood clot or a medical condition that can cause an increased risk of forming blood clots. Blood clots can cause serious health problems by blocking the flow of blood to the heart, lung, or brain. Coumadin can prevent harmful blood clots from forming. As a reminder your indication for Coumadin is:   Stroke Prevention Because Of Atrial Fibrillation  What test will check on my response to Coumadin? While on Coumadin (warfarin) you will need to have an INR test regularly to ensure that your dose is keeping you in the desired range. The INR (international normalized ratio) number is calculated from the result of the laboratory test called prothrombin time (PT).  If an INR APPOINTMENT HAS NOT ALREADY BEEN MADE FOR YOU please schedule an appointment to have this lab work done by your health care provider within 7 days. Your INR goal is usually a number between:  2 to 3 or your provider may give you a more narrow range like 2-2.5.  Ask your health care provider during an office visit what your goal INR is.  What  do you need to  know  About  COUMADIN? Take Coumadin (warfarin) exactly as prescribed by your healthcare provider about the same time each day.  DO NOT stop taking without talking to the doctor who prescribed the medication.  Stopping without other blood clot prevention medication to take the place of Coumadin may increase your risk of developing a new clot or stroke.  Get refills before you run out.  What do you do if you miss a dose? If you miss a dose, take it as soon as you remember on the same day then continue your regularly  scheduled regimen the next day.  Do not take two doses of Coumadin at the same time.  Important Safety Information A possible side effect of Coumadin (Warfarin) is an increased risk of bleeding. You should call your healthcare provider right away if you experience any of the following:   Bleeding from an injury or your nose that does not stop.   Unusual colored urine (red or dark brown) or unusual colored stools (red or black).   Unusual bruising for unknown reasons.   A serious fall or if you hit your head (even if there is no bleeding).  Some foods or medicines interact with Coumadin (warfarin) and might alter your response to warfarin. To help avoid this:   Eat a balanced diet, maintaining a consistent amount of Vitamin K.   Notify your provider about major diet changes you plan to make.   Avoid alcohol or limit your intake to 1 drink for women and 2 drinks for men per day. (1 drink is 5 oz. wine, 12 oz. beer, or 1.5 oz. liquor.)  Make sure that ANY health care provider who prescribes medication for you knows that you are taking Coumadin (warfarin).  Also make sure the healthcare provider who is monitoring your Coumadin knows when you have started a new medication including herbals and non-prescription products.  Coumadin (Warfarin)  Major Drug Interactions  Increased Warfarin Effect Decreased Warfarin Effect  Alcohol (large quantities) Antibiotics (esp. Septra/Bactrim,  Flagyl, Cipro) Amiodarone (Cordarone) Aspirin (ASA) Cimetidine (Tagamet) Megestrol (Megace) NSAIDs (ibuprofen, naproxen, etc.) Piroxicam (Feldene) Propafenone (Rythmol SR) Propranolol (Inderal) Isoniazid (INH) Posaconazole (Noxafil) Barbiturates (Phenobarbital) Carbamazepine (Tegretol) Chlordiazepoxide (Librium) Cholestyramine (Questran) Griseofulvin Oral Contraceptives Rifampin Sucralfate (Carafate) Vitamin K   Coumadin (Warfarin) Major Herbal Interactions  Increased Warfarin Effect Decreased Warfarin  Effect  Garlic Ginseng Ginkgo biloba Coenzyme Q10 Green tea St. Johns wort    Coumadin (Warfarin) FOOD Interactions  Eat a consistent number of servings per week of foods HIGH in Vitamin K (1 serving =  cup)  Collards (cooked, or boiled & drained) Kale (cooked, or boiled & drained) Mustard greens (cooked, or boiled & drained) Parsley *serving size only =  cup Spinach (cooked, or boiled & drained) Swiss chard (cooked, or boiled & drained) Turnip greens (cooked, or boiled & drained)  Eat a consistent number of servings per week of foods MEDIUM-HIGH in Vitamin K (1 serving = 1 cup)  Asparagus (cooked, or boiled & drained) Broccoli (cooked, boiled & drained, or raw & chopped) Brussel sprouts (cooked, or boiled & drained) *serving size only =  cup Lettuce, raw (green leaf, endive, romaine) Spinach, raw Turnip greens, raw & chopped   These websites have more information on Coumadin (warfarin):  FailFactory.se; VeganReport.com.au;

## 2013-06-22 NOTE — Progress Notes (Signed)
IV and tele monitor d/c at this time; pt given d/c instructions; pt verbalized understanding of d/c instructions; son and wife in room and understand instructions as well; pt to d/c home with wife and son; pt eating at this time then to d/c home.

## 2013-06-22 NOTE — Anesthesia Postprocedure Evaluation (Signed)
  Anesthesia Post-op Note  Patient: [name]  Procedure(s) Performed: Procedure(s) with comments: TRANSESOPHAGEAL ECHOCARDIOGRAM (TEE) (N/A) CARDIOVERSION (N/A) - 15:17 Lido 20mg ,IV , Propofol 30 mg, IV synched cardioversion @ 150 joules by Dr. Lajoyce Lauber from a flutter to sinis brady which is baseline for this pt, reports 45-50 reg HR verified by Dr. Ashok Norris  Patient Location: PACU and Short Stay  Anesthesia Type:MAC  Level of Consciousness: awake  Airway and Oxygen Therapy: Patient Spontanous Breathing  Post-op Pain: mild  Post-op Assessment: Post-op Vital signs reviewed  Post-op Vital Signs: Reviewed  Complications: No apparent anesthesia complications

## 2013-06-22 NOTE — H&P (View-Only) (Signed)
[name] is a 80 y.o. male  Admit Date:06/21/2013  Referring Physician: Dr. Adella Hare Primary Cardiologist:: Olin Pia, M.D.  Chief complaint / reason for admission: Atrial flutter with rapid ventricular response  HPI: [name] is 80 years of age and has a history of atrial fibrillation and prior ablation at the [facility] greater than 7 years ago. Over the past 2 weeks he has noted exertional dyspnea. 7 days ago during a urology appointment, they noted an elevated heart rate of near 125 beats per minute. The patient called in earlier this week and was brought in for an electrocardiogram. The electrocardiogram was compatible with atrial flutter with 2:1  AV conduction. Metoprolol succinate 50 mg was started. He was scheduled to see Dr. Caryl Comes but not until 06/26/13. I asked him to come in earlier just to check and make sure he felt okay. During the office visit he did note exertional dyspnea and a desire to get his heart back and rhythm as soon as possible. I spoke with Dr. Lovena Le about the patient. After discussion with the patient we have decided to admit him to the hospital and get him set up for a TEE cardioversion. He denies angina, orthopnea, PND, lower extremity swelling, neurological complaints, and any recent bleeding.    PMH:    Past Medical History  Diagnosis Date  . AF (atrial fibrillation)     s/p ablation at [facility] 2007  . Neoplasm of uncertain behavior     SKIN  . PMR (polymyalgia rheumatica)     HX OF  . Hyperlipidemia   . Hypothyroidism   . HYPERTENSION, BENIGN 08/21/2009    Qualifier: Diagnosis of  By: Caryl Comes, MD, Remus Blake  Medications(s)  enalapril   . Polymyalgia rheumatica 01/13/2007    Qualifier: History of  By: Danny Lawless CMA, [place]       PSH:    Past Surgical History  Procedure Laterality Date  . Hand surgery    . Tonsillectomy    . Inguinal hernia repair    . Cataract extraction, bilateral  Summer 2009    ALLERGIES:   Review of patient's allergies indicates no known allergies. Prior to Admit Meds:   (Not in a hospital admission) Family HX:    Family History  Problem Relation Age of Onset  . Emphysema Mother   . Diabetes Mother   . Kidney disease Father    Social HX:    History   Social History  . Marital Status: Married    Spouse Name: [name]    Number of Children: 3  . Years of Education: 16   Occupational History  . RETIRED BANKER     Still active [employer]  . RETIRED    Social History Main Topics  . Smoking status: Former Smoker    Quit date: 05/04/1963  . Smokeless tobacco: Never Used  . Alcohol Use: Yes     Comment: rare glass of wine  . Drug Use: No  . Sexual Activity: Yes    Partners: Female   Other Topics Concern  . Not on file   Social History Narrative   [name], [place],  MBA, Chiropodist. Married 1958. 2 -girls '59, '62; 1 son '64; 2 grandchildren. Retired Customer service manager. END of LIFE CARE: Yes - DNR, yes - for short term mechanical ventilation.has a living will, would not want any heroic/futile care.  Wife sustained fracture proximal humerus left - Sept '11  ROS : Denies neurological complaints. No episodes of syncope, claudication, orthopnea, PND, edema, or chest pain  Physical Exam:  BP 160/100, heart rate 120 beats per minute, respirations 16 and nonlabored   The patient is in no acute distress. He appears than his stated age. He was able to walk into the office without distress. The skin is clear to observation. No nail bed cyanosis is noted. No jaundice is observed. The HEENT exam is unremarkable. There is no jaundice or pallor. The chest is clear to auscultation and percussion. The cardiac exam reveals a rapid rhythm. No obvious murmurs heard. No gallop is heard. PMI is not palpable. Abdomen is soft. Bowel sounds are normal. No pulsatile masses or bruits are appreciated. Extremities reveal no edema. Pulses are 2+ and  symmetric in the upper and lower extremities. The neurological exam is on remarkable. No focal motor or sensory deficits noted .  Labs: Lab Results  Component Value Date   WBC 6.1 06/18/2013   HGB 17.1* 06/18/2013   HCT 52.3* 06/18/2013   MCV 93.8 06/18/2013   PLT 187.0 06/18/2013     Recent Labs Lab 06/18/13 1120  NA 140  K 5.0  CL 104  CO2 30  BUN 28*  CREATININE 1.3  CALCIUM 9.0  GLUCOSE 86      Radiology:  Pending  EKG:  Atrial flutter (left atrial) had an atrial rate of 240 beats per minute with a ventricular response of 120 beats per minute. No acute ST-T wave changes noted  ASSESSMENT:  1. Atrial flutter with 2:1 AV conduction at a ventricular rate of 120 beats per minute. Duration is unknown but possibly present for greater than 10 days. 2. Exertional dyspnea likely secondary to acute diastolic heart failure 3. Hypertension, poorly controlled 4. History of atrial fibrillation, with prior ablation performed at the [facility] greater than 7 years ago  Plan:  1. The patient will be admitted to the hospital to manage his current problem including atrial flutter which will likely need TEE guided cardioversion. I requested that consideration be given to performing the procedure within the next 24 hours.  2. Start anticoagulation therapy 3. Tighter blood pressure control will be instituted 4. Chronic anticoagulation therapy will be instituted 5. 2-D Doppler echocardiogram to assess current LV function. Last LVEF by echo in 2007 was 45-50% 6. Chest x-ray and BNP level will be obtained 7. The patient will be admitted to the electrophysiology service  [name],[name] 06/21/2013 12:08 PM    

## 2013-06-22 NOTE — CV Procedure (Signed)
Electrical Cardioversion Procedure Note ATSUSHI YOM [id number] June 05, 1925  Procedure: Electrical Cardioversion Indications:  Atrial Flutter  Time Out: Verified patient identification, verified procedure,medications/allergies/relevent history reviewed, required imaging and test results available.  Performed  Procedure Details  The patient was NPO after midnight. Anesthesia was administered at the beside  by Dr.Edwards with 30mg  of propofol.  Cardioversion was done with synchronized biphasic defibrillation with AP pads with 150watts.  The patient converted to normal sinus rhythm. The patient tolerated the procedure well   IMPRESSION:  Successful cardioversion of atrial flutter to sinus bradycardia at 35-52bpm    Teretha Chalupa R 06/22/2013, 3:20 PM

## 2013-06-22 NOTE — Care Management Note (Signed)
    Page 1 of 1   06/22/2013     4:28:35 PM   CARE MANAGEMENT NOTE 06/22/2013  Patient:  [name], [name]   Account Number:  [id number]  Date Initiated:  06/22/2013  Documentation initiated by:  Cleopha Indelicato  Subjective/Objective Assessment:   PT ADM ON 2/19 WITH DYSPNEA, AFIB WITH RVR.  PTA, PT INDEPENDENT, LIVES WITH SPOUSE.     Action/Plan:   CM REFERRAL TO CHECK COVERAGE FOR LOVENOX.  PER PHARMACY, DOSE IS 70MG  BID FOR APPROX 7 DAYS.   Anticipated DC Date:  06/23/2013   Anticipated DC Plan:  [place]  CM consult  Medication Assistance      Choice offered to / List presented to:             Status of service:  Completed, signed off Medicare Important Message given?   (If response is "NO", the following Medicare IM given date fields will be blank) Date Medicare IM given:   Date Additional Medicare IM given:    Discharge Disposition:  HOME/SELF CARE  Per UR Regulation:  Reviewed for med. necessity/level of care/duration of stay  If discussed at [place] of Stay Meetings, dates discussed:    Comments:  06/22/13 Asif Muchow,RN,BSN [phone] PT S/P TEE/CV THIS AFTERNOON; [place] ON LOVENOX AND COUMADIN.  PT STATES HE CAN AFFORD LOVENOX COPAY, AND FEELS COMFORTABLE GIVING INJECTIONS.  HE WAS ABLE TO SELF-INJECT WITHOUT DIFFICULTY WITH AM DOSE OF LOVENOX, PER BEDSIDE NURSE.  WIFE AND SON AT BEDSIDE; ALL QUESTIONS ANSWERED.  06/22/13 Chaye Misch,RN,BSN [phone] PER PT'S PHARMACY, [facility] ON [address], ENTIRE COURSE OF ENOXAPARIN IS COVERED BY INSURANCE, AND PT'S RESPONSIBILITY IS $156.22.  WILL DISCUSS WITH PT AFTER RETURNED FROM PROCEDURE.

## 2013-06-22 NOTE — Anesthesia Preprocedure Evaluation (Signed)
Anesthesia Evaluation  Patient identified by MRN, date of birth, ID band Patient awake    Airway       Dental  (+) Dental Advisory Given   Pulmonary former smoker,          Cardiovascular hypertension, Pt. on home beta blockers and Pt. on medications +CHF     Neuro/Psych    GI/Hepatic   Endo/Other  Hypothyroidism   Renal/GU      Musculoskeletal   Abdominal   Peds  Hematology   Anesthesia Other Findings   Reproductive/Obstetrics                           Anesthesia Physical Anesthesia Plan  ASA: III  Anesthesia Plan: MAC   Post-op Pain Management:    Induction: Intravenous  Airway Management Planned: Mask  Additional Equipment:   Intra-op Plan:   Post-operative Plan:   Informed Consent: I have reviewed the patients History and Physical, chart, labs and discussed the procedure including the risks, benefits and alternatives for the proposed anesthesia with the patient or authorized representative who has indicated his/her understanding and acceptance.   Dental advisory given  Plan Discussed with: Anesthesiologist and Surgeon  Anesthesia Plan Comments:         Anesthesia Quick Evaluation

## 2013-06-22 NOTE — CV Procedure (Signed)
PROCEDURE NOTE  Procedure:  Transesophageal echocardiogram Operator:  Fransico Him, MD Indications:  Atrial flutter with RVR Complications:  None IV Medications:  Versed 4mg , Fentanyl 60mcg IV  Results: Dilated LV with severe LV dysfunction EF 20% Normal PV  Normal  trileaflet AV with trivial eccentric AI Normal MV with mild to moderate central MR Moderately dilated LA with normal LA appendage and no evidence of LA or LAA thrombus.  Normal LAA emptying velocity. Mild TV prolapse with small ruptured chordae tendinae and eccentric mild TR Trivial Pericardia effusion  Patient tolerated procedure well and went on to DCCV

## 2013-06-22 NOTE — Progress Notes (Signed)
ELECTROPHYSIOLOGY ROUNDING NOTE    Patient Name: [name] Date of Encounter: 06/22/2013    SUBJECTIVE:Patient feels well this morning.  No chest pain or shortness of breath.  He is relatively asymptomatic with atrial flutter.  Echocardiogram yesterday demonstrated EF 20%, diffuse hypokinesis with some regional variation, prominent apical trabeculation, mild to moderate MR, small pericardial effusion Prev echo 2007>>normal LV function 45-50  Admitted with atypical flutter without awareness;  Prior PVI for Afib  [facility] 2007 Amiodarone started  Heparin and coumadin as well  TELEMETRY: Reviewed telemetry pt in atrial flutter, ventriclar rate 130 Filed Vitals:   06/21/13 1142 06/21/13 2100 06/22/13 0548  BP: 144/107 152/95 150/104  Pulse: 119 103 114  Temp: 98 F (36.7 C) 98.2 F (36.8 C) 97.9 F (36.6 C)  TempSrc: Oral Oral Oral  Resp: 18 19 18   SpO2: 98% 98% 94%   PHYSICAL EXAM Well developed and nourished in no acute distress HENT normal Neck supple   Clear Fast and Regular rate and rhythm, no murmurs or gallops Abd-soft with active BS No Clubbing cyanosis edema Skin-warm and dry A & Oriented  Grossly normal sensory and motor function    Intake/Output Summary (Last 24 hours) at 06/22/13 0933 Last data filed at 06/22/13 0600  Gross per 24 hour  Intake 357.48 ml  Output      0 ml  Net 357.48 ml    CURRENT MEDICATIONS: . aspirin  325 mg Oral Daily  . atorvastatin  10 mg Oral Daily  . enalapril  5 mg Oral Daily  . heparin  3,000 Units Intravenous Once  . levothyroxine  50 mcg Oral QAC breakfast  . metoprolol succinate  50 mg Oral Daily  . mirabegron ER  25 mg Oral Daily  . multivitamin with minerals  1 tablet Oral Daily  . sodium chloride  3 mL Intravenous Q12H  . testosterone cypionate  400 mg Intramuscular Q28 days    LABS: Basic Metabolic Panel:  Recent Labs  06/21/13 1641  NA 138  K 5.2  CL 101  CO2 27  GLUCOSE 103*  BUN 38*    CREATININE 1.35  CALCIUM 9.4  MG 2.2   Liver Function Tests:  Recent Labs  06/21/13 1641  AST 32  ALT 15  ALKPHOS 64  BILITOT 1.0  PROT 7.3  ALBUMIN 4.0   CBC:  Recent Labs  06/21/13 1641 06/22/13 0255  WBC 7.1 6.0  NEUTROABS 5.4  --   HGB 16.9 16.3  HCT 50.6 47.7  MCV 93.4 92.3  PLT 167 162   Cardiac Enzymes:  Recent Labs  06/21/13 1615 06/21/13 1918 06/22/13 0100  TROPONINI <0.30 <0.30 <0.30   Thyroid Function Tests:  Recent Labs  06/21/13 1641  TSH 2.944    Radiology/Studies:  Dg Chest 2 View 06/21/2013   CLINICAL DATA:  CHF.  EXAM: CHEST  2 VIEW  COMPARISON:  11/27/2004.  FINDINGS: The heart is mildly enlarged but stable. There is tortuosity and calcification of the thoracic aorta. There are mild bronchitic lung changes but no focal infiltrates or effusions.  IMPRESSION: Mild stable cardiac enlargement but no acute pulmonary findings.   Electronically Signed   By: Kalman Jewels M.D.   On: 06/21/2013 15:08         Principal Problem:   Atrial flutter Active Problems:   HYPERTENSION, BENIGN   Acute systolic heart failure   ON BB and ACE, will increase dose given hypertension Will proceed with TEEDCCV with hep  and coumadin Stop ASA Stiop amio Anticipate discharge today on LMWH and coumadin

## 2013-06-22 NOTE — Progress Notes (Signed)
  Echocardiogram Echocardiogram Transesophageal has been performed.  Philipp Deputy 06/22/2013, 4:08 PM

## 2013-06-22 NOTE — Progress Notes (Signed)
Pt back in room s/p TEE and cardioversion; pt awake and alert; son and wife at bedside; VSS; pt SR/SB on monitor at this time; pt to d/c home this evening; will cont. To monitor.

## 2013-06-22 NOTE — Interval H&P Note (Signed)
History and Physical Interval Note:  06/22/2013 2:33 PM  [name]  has presented today for surgery, with the diagnosis of afib RVR  The various methods of treatment have been discussed with the patient and family. After consideration of risks, benefits and other options for treatment, the patient has consented to  Procedure(s): TRANSESOPHAGEAL ECHOCARDIOGRAM (TEE) (N/A) CARDIOVERSION (N/A) as a surgical intervention .  The patient's history has been reviewed, patient examined, no change in status, stable for surgery.  I have reviewed the patient's chart and labs.  Questions were answered to the patient's satisfaction.     TURNER,TRACI R

## 2013-06-22 NOTE — Progress Notes (Signed)
ANTICOAGULATION CONSULT NOTE - Follow-Up Consult  Pharmacy Consult for Heparin Indication: atrial fibrillation  No Known Allergies  Patient Measurements: Height: 5\' 9"  (175.3 cm) Weight: 159 lb 14.4 oz (72.53 kg) IBW/kg (Calculated) : 70.7 Heparin Dosing Weight: 74.4 kg  Vital Signs: Temp: 97.9 F (36.6 C) (02/20 0548) Temp src: Oral (02/20 0548) BP: 150/104 mmHg (02/20 0548) Pulse Rate: 114 (02/20 0548)  Labs:  Recent Labs  06/21/13 1615 06/21/13 1641 06/21/13 1918 06/21/13 2245 06/22/13 0100 06/22/13 0255  HGB  --  16.9  --   --   --  16.3  HCT  --  50.6  --   --   --  47.7  PLT  --  167  --   --   --  162  APTT  --  188*  --   --   --   --   LABPROT  --  14.6  --   --   --   --   INR  --  1.16  --   --   --   --   HEPARINUNFRC  --   --   --  0.40  --  0.26*  CREATININE  --  1.35  --   --   --   --   TROPONINI <0.30  --  <0.30  --  <0.30  --     Estimated Creatinine Clearance: 40 ml/min (by C-G formula based on Cr of 1.35).   Medical History: Past Medical History  Diagnosis Date  . AF (atrial fibrillation)     s/p ablation at [facility] 2007  . Neoplasm of uncertain behavior     SKIN  . Hyperlipidemia   . Hypothyroidism   . HYPERTENSION, BENIGN 08/21/2009    Qualifier: Diagnosis of  By: Caryl Comes, MD, Remus Blake  Medications(s)  enalapril   . Polymyalgia rheumatica 01/13/2007    Qualifier: History of  By: Danny Lawless CMA, [place]     . Arthritis     "joints" (06/21/2013)    Medications:  Scheduled:  . atorvastatin  10 mg Oral Daily  . enalapril  10 mg Oral Daily  . enoxaparin (LOVENOX) injection  70 mg Subcutaneous Q12H  . levothyroxine  50 mcg Oral QAC breakfast  . metoprolol succinate  50 mg Oral Daily  . mirabegron ER  25 mg Oral Daily  . multivitamin with minerals  1 tablet Oral Daily  . patient's guide to using coumadin book   Does not apply Once  . sodium chloride  3 mL Intravenous Q12H  . testosterone cypionate  400 mg Intramuscular  Q28 days  . warfarin  5 mg Oral Once  . warfarin   Does not apply Once  . Warfarin - Pharmacist Dosing Inpatient   Does not apply q1800    Assessment: 80 yr male admitted with aflutter, RVR.  Pharmacy asked to begin anticoagulation with IV heparin, planning for possible TEE/ cardioversion.  No anticoagulants PTA.  Patient says he was on Coumadin in the past, but it's been at least 8-10 yrs ago.  Interested in [place] if he is to be discharged on anticoagulation, but says he'll "do what ever we tell him is best."  Pharmacy asked to stop IV heparin and start Lovenox treatment dosing and Coumadin so that patient can be discharged today.  TEE/ cardioversion scheduled for 1400.  Goal of Therapy:  Anti-Xa level 0.6-1.2 four hours after Lovenox dose. INR 2-3 Monitor platelets by anticoagulation protocol: Yes   Plan:  1. D/C Heparin now. 2. In 1 hr, give Lovenox 70 mg sq, and will continue dosing q 12 hrs. 3. Coumadin 5 mg po x 1 today.  Recommend Coumadin 5 mg daily until INR recheck on Monday with [facility]. 4. CBC q 72 hrs, daily INR while inpatient. 5.  Will review Coumadin/Lovenox education with patient prior to discharge.  Uvaldo Rising, BCPS  Clinical Pharmacist Pager [phone]  06/22/2013 10:35 AM

## 2013-06-22 NOTE — Progress Notes (Signed)
Pt given first dose Lovenox and Coumadin at this time; pt given detailed instructions regarding Lovenox administration; pt states he is familiar with Coumadin as he took it years ago; pt given Coumadin booklet; will cont. To monitor.

## 2013-06-22 NOTE — Discharge Summary (Signed)
Discharge Summary   Patient ID: [name],  MRN: [id number], DOB/AGE: [birth year] 80 y.o.  Admit date: 06/21/2013 Discharge date: 06/22/2013  Primary Care Provider: Adella Hare Primary Cardiologist: Olin Pia, MD   Discharge Diagnoses Principal Problem:   Atrial flutter  **s/p TEE/DCCV this admission.  **Coumadin initiated this admission with lovenox bridging at discharge.  Active Problems:   HYPERTENSION, BENIGN   Acute systolic heart failure   HYPOTHYROIDISM   HYPERLIPIDEMIA   H/O Atrial Fibrillation s/p catheter ablation/Pulmonary Vein isolation (2007)  Allergies No Known Allergies  Procedures  2D Echocardiogram 2.19.2015  Study Conclusions  - Left ventricle: The cavity size was normal. Wall thickness   was increased in a pattern of mild LVH. Systolic function   was severely reduced. The estimated ejection fraction was   20%. Diffuse hypokinesis with some regional variation.   Prominent apical trabeculation and false tendon, no   definite mural thrombus. The study is not technically   sufficient to allow evaluation of LV diastolic function. - Aortic valve: Mildly calcified annulus. Trileaflet.   Trivial regurgitation. - Mitral valve: Mildly thickened leaflets . Mild to moderate   regurgitation. - Left atrium: The atrium was mildly dilated. - Right ventricle: Systolic function was moderately to   severely reduced. - Tricuspid valve: Mild regurgitation directed   eccentrically. - Pulmonary arteries: PA peak pressure: 25mm Hg (S). - Pericardium, extracardiac: A small pericardial effusion   was identified circumferential to the heart. Evidence of   organization noted. _____________   Transesophageal Echocardiogram & Direct Current Cardioversion 2.20.2015  Results: Dilated LV with severe LV dysfunction EF 20% Normal PV   Normal  trileaflet AV with trivial eccentric AI Normal MV with mild to moderate central MR Moderately dilated LA with normal LA  appendage and no evidence of LA or LAA thrombus.  Normal LAA emptying velocity. Mild TV prolapse with small ruptured chordae tendinae and eccentric mild TR Trivial Pericardia effusion  Patient tolerated procedure well and went on to DCCV  **Cardioversion was done with synchronized biphasic defibrillation with AP pads with 150 J. The patient converted to normal sinus rhythm. _____________   History of Present Illness  80 year old male with a prior history of atrial fibrillation status post radiofrequency catheter ablation and pulmonary vein isolation at the [facility] in 2007.  Over a 2 week period prior to admission, he began to note tachycardia with rates in the 120's.  He was brought into the office for an ECG, which revealed atrial flutter with 2:1 AV conduction.  Toprol XL 50mg  daily was started and arrangements were made for follow-up.  He presented back to clinic on 2/19 and remained in atrial flutter with a rate of 120 bpm.  He also reported dyspnea on exertion.  Decision was made to admit him for further evaluation and TEE and cardioversion.  Hospital Course  Following admission, he was placed on IV heparin and this was later switched of subcutaneous lovenox @ 70mg  q 12h.  Coumadin therapy was also instituted.  A 2D echocardiogram was performed and revealed new LV systolic dysfunction, with an EF of 20%.  His volume status was felt to be stable.  He was seen by Dr. Caryl Comes this morning with recommendation to pursue TEE and cardioversion today.  His home dose of enalapril was titrated to 10mg  daily in light of ongoing HTN and newly diagnosed cardiomyopathy (presumed to be rate related).  TEE was performed this afternoon revealing no evidence of LAA thrombus.  As  a result, cardioversion was successfully carried out with 150J and conversion to sinus bradycardia.  Post-cardioversion, he has been ambulating without symptoms or limitations and he will be discharged this afternoon in good  condition.  Discharge Vitals Blood pressure 147/107, pulse 110, temperature 97.9 F (36.6 C), temperature source Oral, resp. rate 18, height 5\' 9"  (1.753 m), weight 159 lb 14.4 oz (72.53 kg), SpO2 98.00%.  Filed Weights   06/22/13 0900  Weight: 159 lb 14.4 oz (72.53 kg)   Labs  CBC  Recent Labs  06/21/13 1641 06/22/13 0255  WBC 7.1 6.0  NEUTROABS 5.4  --   HGB 16.9 16.3  HCT 50.6 47.7  MCV 93.4 92.3  PLT 167 431   Basic Metabolic Panel  Recent Labs  06/21/13 1641  NA 138  K 5.2  CL 101  CO2 27  GLUCOSE 103*  BUN 38*  CREATININE 1.35  CALCIUM 9.4  MG 2.2   Liver Function Tests  Recent Labs  06/21/13 1641  AST 32  ALT 15  ALKPHOS 64  BILITOT 1.0  PROT 7.3  ALBUMIN 4.0   Cardiac Enzymes  Recent Labs  06/21/13 1615 06/21/13 1918 06/22/13 0100  TROPONINI <0.30 <0.30 <0.30   Thyroid Function Tests  Recent Labs  06/21/13 1641  TSH 2.944   Disposition  Pt is being discharged home today in good condition.  Follow-up Plans & Appointments  Follow-up Information   Follow up with Virl Axe, MD On 08/28/2013. (11:00 AM)    Specialty:  Cardiology   Contact information:   [address] [place] [address] [phone]       Follow up with Adella Hare, MD On 07/26/2013. (9:30 AM)    Specialty:  Internal Medicine   Contact information:   [address] [place] [address] [phone]       Follow up with Ileene Hutchinson, PA-C On 07/04/2013. (2:00 PM)    Specialty:  Cardiology   Contact information:   [address] [place] [address] [phone]       Follow up with [facility] On 06/25/2013. (2:40 PM)    Contact information:   [address] [place]     Discharge Medications    Medication List    STOP taking these medications       aspirin 325 MG tablet      TAKE these medications       atorvastatin 10 MG tablet  Commonly known as:   LIPITOR  Take 1 tablet (10 mg total) by mouth daily.     CENTRUM SILVER PO  Take 1 tablet by mouth every morning.     cholecalciferol 1000 UNITS tablet  Commonly known as:  VITAMIN D  Take 1,000 Units by mouth every morning.     enalapril 10 MG tablet  Commonly known as:  VASOTEC  Take 1 tablet (10 mg total) by mouth daily.     enoxaparin 80 MG/0.8ML injection  Commonly known as:  LOVENOX  Inject 0.7 mLs (70 mg total) into the skin every 12 (twelve) hours.     Glucosamine-Chondroitin 500-400 MG Caps  Take 1 capsule by mouth every morning.     levothyroxine 50 MCG tablet  Commonly known as:  SYNTHROID  Take 1 tablet (50 mcg total) by mouth daily.     metoprolol succinate 50 MG 24 hr tablet  Commonly known as:  TOPROL XL  Take 1 tablet (50  mg total) by mouth daily. Take with or immediately following a meal.     mirabegron ER 25 MG Tb24 tablet  Commonly known as:  MYRBETRIQ  Take 25 mg by mouth every morning.     REFRESH TEARS 0.5 % Soln  Generic drug:  carboxymethylcellulose  Place 1 drop into both eyes 3 (three) times daily as needed (dry eyes).     sildenafil 20 MG tablet  Commonly known as:  REVATIO  Take 40-100 mg by mouth daily as needed (erectile dusfunction).     testosterone cypionate 200 MG/ML injection  Commonly known as:  DEPOTESTOTERONE CYPIONATE  Inject 2 mLs (400 mg total) into the muscle every 28 (twenty-eight) days.     warfarin 5 MG tablet  Commonly known as:  COUMADIN  Take 1 tablet (5 mg total) by mouth daily.       Outstanding Labs/Studies  INR follow-up in coumadin clinic on 06/25/2013.  Duration of Discharge Encounter   Greater than 30 minutes including physician time.  Signed, Murray Hodgkins NP 06/22/2013, 1:44 PM

## 2013-06-22 NOTE — Preoperative (Signed)
Beta Blockers   Reason not to administer Beta Blockers:Not Applicable, pt took 06/22/13 

## 2013-06-25 ENCOUNTER — Encounter (HOSPITAL_COMMUNITY): Payer: Self-pay | Admitting: Cardiology

## 2013-06-25 ENCOUNTER — Telehealth: Payer: Self-pay

## 2013-06-25 NOTE — Telephone Encounter (Signed)
Spoke with pt's son, reports pt not sleeping well and feeling tired and lethargic since hospital discharge on 06/22/13, pt concerned it is related to Lovenox injections.  Denies any bleeding problems.  Advised pt's son more likely these symptoms are related to recent hospitalization, starting on Beta blocker Metoprolol, or possibly afib returning. Not likely to be caused by Lovenox.  Advised pt's son of the importance of continuing Lovenox and Coumadin until INR therapeutic to protect pt from clot or stroke.  Pt's son verbalized understanding. Will keep follow-up appt tomorrow for INR check, will call back to triage if pt gets to feeling worse for further recommendations.  If still feeling poorly tomorrow could do EKG to assess rhythm.  BP's are stable and WNL, HR in 50-60's at present.

## 2013-06-26 ENCOUNTER — Ambulatory Visit (INDEPENDENT_AMBULATORY_CARE_PROVIDER_SITE_OTHER): Payer: Medicare Other

## 2013-06-26 ENCOUNTER — Ambulatory Visit: Payer: Medicare Other | Admitting: Physician Assistant

## 2013-06-26 DIAGNOSIS — I4892 Unspecified atrial flutter: Secondary | ICD-10-CM | POA: Diagnosis not present

## 2013-06-26 DIAGNOSIS — Z5181 Encounter for therapeutic drug level monitoring: Secondary | ICD-10-CM

## 2013-06-26 LAB — POCT INR: INR: 4.2

## 2013-06-26 MED ORDER — WARFARIN SODIUM 2 MG PO TABS: ORAL_TABLET | ORAL | Status: DC

## 2013-06-26 NOTE — Patient Instructions (Signed)

## 2013-06-27 ENCOUNTER — Ambulatory Visit (INDEPENDENT_AMBULATORY_CARE_PROVIDER_SITE_OTHER): Payer: Medicare Other | Admitting: *Deleted

## 2013-06-27 DIAGNOSIS — E291 Testicular hypofunction: Secondary | ICD-10-CM

## 2013-06-27 MED ORDER — TESTOSTERONE CYPIONATE 200 MG/ML IM SOLN
400.0000 mg | INTRAMUSCULAR | Status: DC
  Administered 2013-06-27 – 2013-09-20 (×3): 400 mg via INTRAMUSCULAR

## 2013-06-27 MED ORDER — TESTOSTERONE CYPIONATE 200 MG/ML IM SOLN: 400.0000 mg | INTRAMUSCULAR | Status: DC

## 2013-07-02 ENCOUNTER — Ambulatory Visit (INDEPENDENT_AMBULATORY_CARE_PROVIDER_SITE_OTHER): Payer: Medicare Other

## 2013-07-02 DIAGNOSIS — I4892 Unspecified atrial flutter: Secondary | ICD-10-CM

## 2013-07-02 DIAGNOSIS — Z5181 Encounter for therapeutic drug level monitoring: Secondary | ICD-10-CM

## 2013-07-02 LAB — POCT INR: INR: 2

## 2013-07-04 ENCOUNTER — Encounter: Payer: Self-pay | Admitting: Cardiology

## 2013-07-04 ENCOUNTER — Ambulatory Visit (INDEPENDENT_AMBULATORY_CARE_PROVIDER_SITE_OTHER): Payer: Medicare Other | Admitting: Cardiology

## 2013-07-04 VITALS — BP 162/84 | HR 52 | Ht 69.0 in | Wt 165.8 lb

## 2013-07-04 DIAGNOSIS — I498 Other specified cardiac arrhythmias: Secondary | ICD-10-CM

## 2013-07-04 DIAGNOSIS — I4892 Unspecified atrial flutter: Secondary | ICD-10-CM | POA: Diagnosis not present

## 2013-07-04 DIAGNOSIS — I428 Other cardiomyopathies: Secondary | ICD-10-CM | POA: Diagnosis not present

## 2013-07-04 DIAGNOSIS — R001 Bradycardia, unspecified: Secondary | ICD-10-CM

## 2013-07-04 MED ORDER — METOPROLOL SUCCINATE ER 25 MG PO TB24: 25.0000 mg | ORAL_TABLET | Freq: Every day | ORAL | Status: DC

## 2013-07-04 NOTE — Patient Instructions (Signed)
Your physician recommends that you schedule a follow-up appointment in: with brooke on march 9, th at 1:45pm  Your physician has recommended you make the following change in your medication:   Decrease your metoprolol 25 mg once a day

## 2013-07-04 NOTE — Progress Notes (Signed)
ELECTROPHYSIOLOGY OFFICE NOTE  Patient ID: [name] MRN: [id number], DOB/AGE: 78-Feb-1927   Date of Visit: 07/04/2013  Primary Physician: Gabriel Hare, MD Primary Cardiologist: Gabriel Nap, MD Reason for Visit: Hospital follow-up  History of Present Illness  [name] is a 80 y.o. male with history of atrial fibrillation s/p PVI (pulmonary vein isolation) ablation at [name] in 2007 who has been followed by [name]. In early February he began to note tachycardia with rates in the 120s. He was brought into the office for an ECG which revealed atrial flutter with 2:1 AV conduction. Toprol XL 50mg  daily was started and arrangements were made for follow-up. He presented back to clinic on 06/21/2013 and remained in atrial flutter with a rate of 120 bpm. He also reported dyspnea on exertion. He was then admitted for further evaluation. An echo was done revealing new LV dysfunction, LVEF 20%. This was felt to represent a tachy-mediated cardiomyopathy. He then underwent TEE-guided DCCV which was successful for restoring SR. Of note, DCCV note states he was cardioverted to sinus bradycardia with rate 35-52 bpm. He was discharged and scheduled for follow-up today.   Since discharge, he reports he is doing well and has no complaints other than bruising from administering Lovenox injections for bridging with Coumadin. He states this is improved now that he is no longer on Coumadin. He denies chest pain or shortness of breath. He denies palpitations, dizziness, near syncope or syncope. He denies LE swelling, orthopnea or PND. He reports his energy level is good and he has continued to walk for exercise. He is compliant with medications.  Past Gabriel History Past Gabriel History  Diagnosis Date  . AF (atrial fibrillation)     a. s/p RFCA/PVI @ [facility] 2007.  Marland Kitchen Neoplasm of uncertain behavior     SKIN  . Hyperlipidemia   . Hypothyroidism   . HYPERTENSION, BENIGN 08/21/2009   Qualifier: Diagnosis of  By: Gabriel Comes, MD, [name]  Medications(s)  enalapril   . Polymyalgia rheumatica 01/13/2007    Qualifier: History of  By: [name], [name]     . Arthritis     "joints" (06/21/2013)  . Atrial flutter     a. 06/2013 s/p TEE/DCCV  . Chronic systolic CHF (congestive heart failure)     a. 06/2013 Echo: EF 20%, diff HK, mild LVH.    Past Surgical History Past Surgical History  Procedure Laterality Date  . Hand surgery Right     "broke bone; grafted area w/bone from somewhere else"  . Cataract extraction, bilateral Bilateral Summer 2009  . Tonsillectomy    . Cholecystectomy    . Inguinal hernia repair Bilateral   . Atrial fibrillation ablation  2007  . Hemorrhoid surgery    . Tee without cardioversion N/A 06/22/2013    Procedure: TRANSESOPHAGEAL ECHOCARDIOGRAM (TEE);  Surgeon: Gabriel Margarita, MD;  [name];  Service: Cardiovascular;  Laterality: N/A;  . Cardioversion N/A 06/22/2013    Procedure: CARDIOVERSION;  Surgeon: [facility] Margarita, MD;  Location: [name];  Service: Cardiovascular;  Laterality: N/A;  15:17 Lido 20mg ,IV , Propofol 30 mg, IV synched cardioversion @ 150 joules by Dr. Lajoyce [facility] from a flutter to sinis brady which is baseline for this pt, reports 45-50 reg HR verified by Dr. Ashok [facility]    Allergies/Intolerances No Known Allergies  Current Home Medications Current Outpatient Prescriptions  Medication Sig Dispense Refill  . atorvastatin (LIPITOR) 10 MG tablet Take 1 tablet (10 mg  total) by mouth daily.  90 tablet  3  . carboxymethylcellulose (REFRESH TEARS) 0.5 % SOLN Place 1 drop into both eyes 3 (three) times daily as needed (dry eyes).       . cholecalciferol (VITAMIN D) 1000 UNITS tablet Take 1,000 Units by mouth every morning.      . enalapril (VASOTEC) 10 MG tablet Take 1 tablet (10 mg total) by mouth daily.  90 tablet  3  . Glucosamine-Chondroitin 500-400 MG CAPS Take 1 capsule by mouth every morning.      Marland Kitchen  levothyroxine (SYNTHROID) 50 MCG tablet Take 1 tablet (50 mcg total) by mouth daily.  90 tablet  3  . metoprolol succinate (TOPROL XL) 25 MG 24 hr tablet Take 1 tablet (25 mg total) by mouth daily. Take with or immediately following a meal.  30 tablet  3  . mirabegron ER (MYRBETRIQ) 25 MG TB24 tablet Take 25 mg by mouth every morning.      . Multiple Vitamins-Minerals (CENTRUM SILVER PO) Take 1 tablet by mouth every morning.       . sildenafil (REVATIO) 20 MG tablet Take 40-100 mg by mouth daily as needed (erectile dusfunction).      Marland Kitchen testosterone cypionate (DEPOTESTOTERONE CYPIONATE) 200 MG/ML injection Inject 2 mLs (400 mg total) into the muscle every 28 (twenty-eight) days.  10 mL  1  . warfarin (COUMADIN) 2 MG tablet Take as directed by anticoagulation clinic  30 tablet  0   Current Facility-Administered Medications  Medication Dose Route Frequency Provider Last Rate Last Dose  . testosterone cypionate (DEPOTESTOTERONE CYPIONATE) injection 400 mg  400 mg Intramuscular Q28 days Gabriel Rhymes, MD   400 mg at 06/27/13 1427    Social History History   Social History  . Marital Status: Married    Spouse Name: [name]    Number of Children: 3  . Years of Education: 16   Occupational History  . RETIRED BANKER     Still active w/stocks  . RETIRED    Social History Main Topics  . Smoking status: Former Smoker -- 1.00 packs/day for 12 years    Types: Cigarettes    Quit date: 05/04/1963  . Smokeless tobacco: Never Used  . Alcohol Use: Yes     Comment: 06/21/2013 "I was a moderate drinker at most; rarely have drink now"  . Drug Use: No  . Sexual Activity: Yes    Partners: Female   Other Topics Concern  . Not on file   Social History Narrative   [name], [place],  MBA, Chiropodist. Married 1958. 2 -girls '59, '62; 1 son '64; 2 grandchildren. Retired Customer service manager. END of LIFE CARE: Yes - DNR, yes - for short term mechanical ventilation.has a living will,  would not want any heroic/futile care.  Wife sustained fracture proximal humerus left - Sept '11     Review of Systems General: No chills, fever, night sweats or weight changes Cardiovascular: No chest pain, dyspnea on exertion, edema, orthopnea, palpitations, paroxysmal nocturnal dyspnea Dermatological: No rash, lesions or masses Respiratory: No cough, dyspnea Urologic: No hematuria, dysuria Abdominal: No nausea, vomiting, diarrhea, bright red blood per rectum, melena, or hematemesis Neurologic: No visual changes, weakness, changes in mental status All other systems reviewed and are otherwise negative except as noted above.  Physical Exam Vitals: Blood pressure 162/84, pulse 52, height 5\' 9"  (1.753 m), weight 165 lb 12.8 oz (75.206 kg).  General: Well developed, well appearing 80 y.o. male in  no acute distress. HEENT: Normocephalic, atraumatic. EOMs intact. Sclera nonicteric. Oropharynx clear.  Neck: Supple. No JVD. Lungs: Respirations regular and unlabored, CTA bilaterally. No wheezes, rales or rhonchi. Heart: Bradycardic, regular. S1, S2 present. No murmurs, rub, S3 or S4. Abdomen: Soft, non-distended.  Extremities: No clubbing, cyanosis or edema.  Psych: Normal affect. Neuro: Alert and oriented X 3. Moves all extremities spontaneously.   Diagnostics 12-lead ECG today - junctional bradycardia at 50 bpm; QRS 90, QTc 419  Assessment and Plan 1. Junctional bradycardia - asymptomatic - will decrease metoprolol succinate to 25 mg once daily - return in one week for reassessment  2. Atrial flutter - no documented or symptomatic recurrence since DCCV - continue warfarin for stroke prevention  3. New LV dysfunction, EF 20% - suspected tachy-mediated cardiomyopathy - continue Gabriel therapy - repeat echo in 6-8 weeks to reassess LVEF   Signed, Graycee Greeson, PA-C 07/04/2013, 4:04 PM

## 2013-07-05 ENCOUNTER — Ambulatory Visit: Payer: Medicare Other | Admitting: Audiology

## 2013-07-09 ENCOUNTER — Encounter: Payer: Self-pay | Admitting: Internal Medicine

## 2013-07-09 ENCOUNTER — Ambulatory Visit (INDEPENDENT_AMBULATORY_CARE_PROVIDER_SITE_OTHER): Payer: Medicare Other | Admitting: Internal Medicine

## 2013-07-09 ENCOUNTER — Ambulatory Visit (INDEPENDENT_AMBULATORY_CARE_PROVIDER_SITE_OTHER): Payer: Medicare Other

## 2013-07-09 VITALS — BP 170/79 | HR 46 | Ht 69.0 in | Wt 168.0 lb

## 2013-07-09 DIAGNOSIS — I498 Other specified cardiac arrhythmias: Secondary | ICD-10-CM | POA: Diagnosis not present

## 2013-07-09 DIAGNOSIS — I4892 Unspecified atrial flutter: Secondary | ICD-10-CM | POA: Diagnosis not present

## 2013-07-09 DIAGNOSIS — Z5181 Encounter for therapeutic drug level monitoring: Secondary | ICD-10-CM | POA: Diagnosis not present

## 2013-07-09 DIAGNOSIS — R001 Bradycardia, unspecified: Secondary | ICD-10-CM

## 2013-07-09 LAB — POCT INR: INR: 1.3

## 2013-07-09 MED ORDER — ENALAPRIL MALEATE 10 MG PO TABS: 10.0000 mg | ORAL_TABLET | Freq: Two times a day (BID) | ORAL | Status: DC

## 2013-07-09 NOTE — Progress Notes (Signed)
Patient Care Team: Neena Rhymes, MD as PCP - General Linton Rump, MD (Ophthalmology) [name], MD (Orthopedic Surgery) [name], MD (Cardiology) Adin Hector, MD (General Surgery)   HPI  [name] is a 80 y.o. male Seen in followup of atypical atrial flutter  Echo >>EF 20% <<50%  Prior PVI ([facility]-2007)>> Rx amiodarone+warfarin >>>TEEDCCV and amio dc as first recurrence  Seen last week with bradycardia and metop 50>>25  BP has been elevated at home 150-160  HR slow but always is ;  No impairment in exercise tolerance  Past Medical History  Diagnosis Date  . AF (atrial fibrillation)     a. s/p RFCA/PVI @ [facility] clinic 2007.  Marland Kitchen Neoplasm of uncertain behavior     SKIN  . Hyperlipidemia   . Hypothyroidism   . HYPERTENSION, BENIGN 08/21/2009    Qualifier: Diagnosis of  By: Caryl Comes, MD, Remus Blake  Medications(s)  enalapril   . Polymyalgia rheumatica 01/13/2007    Qualifier: History of  By: Danny Lawless CMA, [place]     . Arthritis     "joints" (06/21/2013)  . Atrial flutter     a. 06/2013 s/p TEE/DCCV  . Chronic systolic CHF (congestive heart failure)     a. 06/2013 Echo: EF 20%, diff HK, mild LVH.    Past Surgical History  Procedure Laterality Date  . Hand surgery Right     "broke bone; grafted area w/bone from somewhere else"  . Cataract extraction, bilateral Bilateral Summer 2009  . Tonsillectomy    . Cholecystectomy    . Inguinal hernia repair Bilateral   . Atrial fibrillation ablation  2007  . Hemorrhoid surgery    . Tee without cardioversion N/A 06/22/2013    Procedure: TRANSESOPHAGEAL ECHOCARDIOGRAM (TEE);  Surgeon: Sueanne Margarita, MD;  Location: [facility];  Service: Cardiovascular;  Laterality: N/A;  . Cardioversion N/A 06/22/2013    Procedure: CARDIOVERSION;  Surgeon: Sueanne Margarita, MD;  Location: [facility];  Service: Cardiovascular;  Laterality: N/A;  15:17 Lido 20mg ,IV , Propofol 30 mg, IV synched  cardioversion @ 150 joules by Dr. Lajoyce Lauber from a flutter to sinis brady which is baseline for this pt, reports 45-50 reg HR verified by Dr. Ashok Norris    Current Outpatient Prescriptions  Medication Sig Dispense Refill  . atorvastatin (LIPITOR) 10 MG tablet Take 1 tablet (10 mg total) by mouth daily.  90 tablet  3  . carboxymethylcellulose (REFRESH TEARS) 0.5 % SOLN Place 1 drop into both eyes 3 (three) times daily as needed (dry eyes).       . cholecalciferol (VITAMIN D) 1000 UNITS tablet Take 1,000 Units by mouth every morning.      . enalapril (VASOTEC) 10 MG tablet Take 1 tablet (10 mg total) by mouth daily.  90 tablet  3  . Glucosamine-Chondroitin 500-400 MG CAPS Take 1 capsule by mouth every morning.      Marland Kitchen levothyroxine (SYNTHROID) 50 MCG tablet Take 1 tablet (50 mcg total) by mouth daily.  90 tablet  3  . metoprolol succinate (TOPROL XL) 25 MG 24 hr tablet Take 1 tablet (25 mg total) by mouth daily. Take with or immediately following a meal.  30 tablet  3  . mirabegron ER (MYRBETRIQ) 25 MG TB24 tablet Take 25 mg by mouth every morning.      . Multiple Vitamins-Minerals (CENTRUM SILVER PO) Take 1 tablet by mouth every morning.       Marland Kitchen  sildenafil (REVATIO) 20 MG tablet Take 40-100 mg by mouth daily as needed (erectile dusfunction).      Marland Kitchen testosterone cypionate (DEPOTESTOTERONE CYPIONATE) 200 MG/ML injection Inject 2 mLs (400 mg total) into the muscle every 28 (twenty-eight) days.  10 mL  1  . warfarin (COUMADIN) 2 MG tablet Take as directed by anticoagulation clinic  30 tablet  0   Current Facility-Administered Medications  Medication Dose Route Frequency Provider Last Rate Last Dose  . testosterone cypionate (DEPOTESTOTERONE CYPIONATE) injection 400 mg  400 mg Intramuscular Q28 days Neena Rhymes, MD   400 mg at 06/27/13 1427    No Known Allergies  Review of Systems negative except from HPI and PMH  Physical Exam BP 170/79  Pulse 46  Ht 5\' 9"  (1.753 m)  Wt 168 lb (76.204 kg)   BMI 24.80 kg/m2 Well developed and well nourished in no acute distress HENT normal E scleral and icterus clear Neck Supple JVP flat; carotids brisk and full Clear to ausculation  *Regular rate and rhythm, no murmurs gallops or rub Soft with active bowel sounds; ecchymosis No clubbing cyanosis none Edema Alert and oriented, grossly normal motor and sensory function Skin Warm and Dry  ECG  SR at 46 14/09/45 12  Assessment and  Plan  Atrial Flutter  Junctional /sinus bradycardia  NICM thought to be rate related  Hypertension  Will increase enalapril 20 daily Recheck BP 2 weeks with [facility]ins,  If still elevated  i would change ACE to ACE/HCT follwoup with me in 6 weeks with echo

## 2013-07-09 NOTE — Patient Instructions (Signed)
Your physician has recommended you make the following change in your medication:  1) Increase Enalapril to 10 mg twice daily  Please have BMET drawn at Dr. Daylene Posey office and fax results to [place] (handwritten rx given for this)  Your physician recommends that you keep your follow-up appointment on: 08/28/13 @ 11:00 am

## 2013-07-16 ENCOUNTER — Ambulatory Visit (INDEPENDENT_AMBULATORY_CARE_PROVIDER_SITE_OTHER): Payer: Medicare Other

## 2013-07-16 DIAGNOSIS — I4892 Unspecified atrial flutter: Secondary | ICD-10-CM | POA: Diagnosis not present

## 2013-07-16 DIAGNOSIS — Z5181 Encounter for therapeutic drug level monitoring: Secondary | ICD-10-CM | POA: Diagnosis not present

## 2013-07-16 LAB — POCT INR: INR: 1.6

## 2013-07-16 MED ORDER — WARFARIN SODIUM 2 MG PO TABS: ORAL_TABLET | ORAL | Status: DC

## 2013-07-19 DIAGNOSIS — H903 Sensorineural hearing loss, bilateral: Secondary | ICD-10-CM | POA: Diagnosis not present

## 2013-07-20 ENCOUNTER — Telehealth: Payer: Self-pay | Admitting: Internal Medicine

## 2013-07-20 DIAGNOSIS — I10 Essential (primary) hypertension: Secondary | ICD-10-CM

## 2013-07-20 MED ORDER — HYDROCHLOROTHIAZIDE 25 MG PO TABS: 25.0000 mg | ORAL_TABLET | Freq: Every day | ORAL | Status: DC

## 2013-07-20 NOTE — Telephone Encounter (Signed)
New problem    Rn w/[employer] [facility] Herma Ard called in to report pt's BP 212/102

## 2013-07-20 NOTE — Telephone Encounter (Signed)
Received call from [place] who is at home.  States BP today has been elevated.  This AM was 200/100; when nurse took it at 2:30 was 210/100.  States BP usually is 170's.  At last OV with Dr. Caryl Comes was 170/79.  States he had hearing aid placed yesterday and is anxious about problems with them.  Is taking Enalapril 10 mg BID.  According to Dr. Olin Pia last [place] pt was to get BP checked at Dr. Linda Hedges office in 2 wks following his visit.  Pt states he did not go because he is taking it at home.  Spoke w/Lori Gerhardt,NP who advised per Dr. Olin Pia last note to start him on HCTZ 25 mg daily and to get a BMET in 1 week. Will send Rx to [facility].  Made an appointment for next Friday for BMET. He will monitor BP and advise [place] if continues to be elevated.

## 2013-07-23 ENCOUNTER — Ambulatory Visit (INDEPENDENT_AMBULATORY_CARE_PROVIDER_SITE_OTHER): Payer: Medicare Other | Admitting: Pharmacist

## 2013-07-23 DIAGNOSIS — Z5181 Encounter for therapeutic drug level monitoring: Secondary | ICD-10-CM

## 2013-07-23 DIAGNOSIS — I4892 Unspecified atrial flutter: Secondary | ICD-10-CM

## 2013-07-23 LAB — POCT INR: INR: 1.8

## 2013-07-26 ENCOUNTER — Other Ambulatory Visit: Payer: Medicare Other

## 2013-07-26 ENCOUNTER — Ambulatory Visit (INDEPENDENT_AMBULATORY_CARE_PROVIDER_SITE_OTHER): Payer: Medicare Other | Admitting: Internal Medicine

## 2013-07-26 ENCOUNTER — Encounter: Payer: Self-pay | Admitting: Internal Medicine

## 2013-07-26 VITALS — BP 152/98 | HR 61 | Temp 97.1°F | Wt 160.0 lb

## 2013-07-26 DIAGNOSIS — I498 Other specified cardiac arrhythmias: Secondary | ICD-10-CM

## 2013-07-26 DIAGNOSIS — E291 Testicular hypofunction: Secondary | ICD-10-CM | POA: Diagnosis not present

## 2013-07-26 DIAGNOSIS — I10 Essential (primary) hypertension: Secondary | ICD-10-CM

## 2013-07-26 DIAGNOSIS — R001 Bradycardia, unspecified: Secondary | ICD-10-CM

## 2013-07-26 LAB — BASIC METABOLIC PANEL
BUN: 42 mg/dL — ABNORMAL HIGH (ref 6–23)
CALCIUM: 9.7 mg/dL (ref 8.4–10.5)
CHLORIDE: 100 meq/L (ref 96–112)
CO2: 31 meq/L (ref 19–32)
CREATININE: 1.6 mg/dL — AB (ref 0.4–1.5)
GFR: 43.83 mL/min — ABNORMAL LOW (ref >60.00)
Glucose, Bld: 92 mg/dL (ref 70–99)
Potassium: 5.9 mEq/L — ABNORMAL HIGH (ref 3.5–5.1)
Sodium: 137 mEq/L (ref 135–145)

## 2013-07-26 MED ORDER — TESTOSTERONE CYPIONATE 200 MG/ML IM SOLN
400.0000 mg | Freq: Once | INTRAMUSCULAR | Status: AC
  Administered 2013-07-26: 400 mg via INTRAMUSCULAR

## 2013-07-26 MED ORDER — METOPROLOL SUCCINATE ER 25 MG PO TB24: 12.5000 mg | ORAL_TABLET | Freq: Every day | ORAL | Status: DC

## 2013-07-26 NOTE — Progress Notes (Signed)
Subjective:    Patient ID: [name], male    DOB: 02-Jun-1925, 80 y.o.   MRN: [id number]  HPI Recently hospitalized for A. Fib with RVR. He was ruled out, underwent TEE guided [facility] with good results. He has had increased hypertension and had his enalapril increased to 20 mg daily and recently had HCTZ 25 mg added to his regimen. His SBP has come down. He is due for Bmet 3/27 which we will do today. He has had no chest pain or other systemic symptoms.  Past Medical History  Diagnosis Date  . AF (atrial fibrillation)     a. s/p RFCA/PVI @ [facility] clinic 2007.  Marland Kitchen Neoplasm of uncertain behavior     SKIN  . Hyperlipidemia   . Hypothyroidism   . HYPERTENSION, BENIGN 08/21/2009    Qualifier: Diagnosis of  By: Caryl Comes, MD, Remus Blake  Medications(s)  enalapril   . Polymyalgia rheumatica 01/13/2007    Qualifier: History of  By: Danny Lawless CMA, [place]     . Arthritis     "joints" (06/21/2013)  . Atrial flutter     a. 06/2013 s/p TEE/DCCV  . Chronic systolic CHF (congestive heart failure)     a. 06/2013 Echo: EF 20%, diff HK, mild LVH.   Past Surgical History  Procedure Laterality Date  . Hand surgery Right     "broke bone; grafted area w/bone from somewhere else"  . Cataract extraction, bilateral Bilateral Summer 2009  . Tonsillectomy    . Cholecystectomy    . Inguinal hernia repair Bilateral   . Atrial fibrillation ablation  2007  . Hemorrhoid surgery    . Tee without cardioversion N/A 06/22/2013    Procedure: TRANSESOPHAGEAL ECHOCARDIOGRAM (TEE);  Surgeon: Sueanne Margarita, MD;  Location: [facility];  Service: Cardiovascular;  Laterality: N/A;  . Cardioversion N/A 06/22/2013    Procedure: CARDIOVERSION;  Surgeon: Sueanne Margarita, MD;  Location: [facility];  Service: Cardiovascular;  Laterality: N/A;  15:17 Lido 20mg ,IV , Propofol 30 mg, IV synched cardioversion @ 150 joules by Dr. Lajoyce Lauber from a flutter to sinis brady which is baseline for this pt, reports 45-50 reg HR verified  by Dr. Ashok Norris   Family History  Problem Relation Age of Onset  . Emphysema Mother   . Diabetes Mother   . Kidney disease Father    History   Social History  . Marital Status: Married    Spouse Name: Doren Kaspar    Number of Children: 3  . Years of Education: 16   Occupational History  . RETIRED BANKER     Still active w/stocks  . RETIRED    Social History Main Topics  . Smoking status: Former Smoker -- 1.00 packs/day for 12 years    Types: Cigarettes    Quit date: 05/04/1963  . Smokeless tobacco: Never Used  . Alcohol Use: Yes     Comment: 06/21/2013 "I was a moderate drinker at most; rarely have drink now"  . Drug Use: No  . Sexual Activity: Yes    Partners: Female   Other Topics Concern  . Not on file   Social History Narrative   [place], [place],  MBA, Chiropodist. Married 1958. 2 -girls '59, '62; 1 son '64; 2 grandchildren. Retired Customer service manager. END of LIFE CARE: Yes - DNR, yes - for short term mechanical ventilation.has a living will, would not want any heroic/futile care.  Wife sustained fracture proximal humerus left - Sept '11  Current Outpatient Prescriptions on File Prior to Visit  Medication Sig Dispense Refill  . atorvastatin (LIPITOR) 10 MG tablet Take 1 tablet (10 mg total) by mouth daily.  90 tablet  3  . carboxymethylcellulose (REFRESH TEARS) 0.5 % SOLN Place 1 drop into both eyes 3 (three) times daily as needed (dry eyes).       . cholecalciferol (VITAMIN D) 1000 UNITS tablet Take 1,000 Units by mouth every morning.      . enalapril (VASOTEC) 10 MG tablet Take 1 tablet (10 mg total) by mouth 2 (two) times daily.  60 tablet  0  . Glucosamine-Chondroitin 500-400 MG CAPS Take 1 capsule by mouth every morning.      . hydrochlorothiazide (HYDRODIURIL) 25 MG tablet Take 1 tablet (25 mg total) by mouth daily.  90 tablet  3  . levothyroxine (SYNTHROID) 50 MCG tablet Take 1 tablet (50 mcg total) by mouth daily.  90 tablet  3  .  mirabegron ER (MYRBETRIQ) 25 MG TB24 tablet Take 25 mg by mouth every morning.      . Multiple Vitamins-Minerals (CENTRUM SILVER PO) Take 1 tablet by mouth every morning.       . sildenafil (REVATIO) 20 MG tablet Take 40-100 mg by mouth daily as needed (erectile dusfunction).      Marland Kitchen testosterone cypionate (DEPOTESTOTERONE CYPIONATE) 200 MG/ML injection Inject 2 mLs (400 mg total) into the muscle every 28 (twenty-eight) days.  10 mL  1  . warfarin (COUMADIN) 2 MG tablet Take as directed by anticoagulation clinic  50 tablet  3   Current Facility-Administered Medications on File Prior to Visit  Medication Dose Route Frequency Provider Last Rate Last Dose  . testosterone cypionate (DEPOTESTOTERONE CYPIONATE) injection 400 mg  400 mg Intramuscular Q28 days Neena Rhymes, MD   400 mg at 06/27/13 1427      Review of Systems System review is negative for any constitutional, cardiac, pulmonary, GI or neuro symptoms or complaints other than as described in the HPI.     Objective:   Physical Exam Filed Vitals:   07/26/13 0923  BP: 152/98  Pulse: 61  Temp: 97.1 F (36.2 C)   Gen'l- WNWD man in no distress HEENT- C&S clear Cor 2+ radial pulse, regular. Auscultation - RRR, I/VI systolic mm, no JVD Pul - clear lungs Neuro - A&O x 3.       Assessment & Plan:

## 2013-07-26 NOTE — Assessment & Plan Note (Signed)
BP Readings from Last 3 Encounters:  07/26/13 152/98  07/09/13 170/79  07/04/13 162/84   On increased dose of enalapril and HCTZ added. BP is improving  Plan  continue present medication  Bmet today.

## 2013-07-26 NOTE — Patient Instructions (Signed)
Good to see you.  Cardiac - heart rate is regular today. Blood pressure is still a little high with a goal of SBP 150 or less. It does take 2-3 weeks to see the maxium effect of a diuretic.  Plan Lab today - results to Dr. Caryl Comes  Continue your present medications.  Keep appointment with Dr. Caryl Comes

## 2013-07-26 NOTE — Progress Notes (Signed)
Pre visit review using our clinic review tool, if applicable. No additional management support is needed unless otherwise documented below in the visit note. 

## 2013-07-27 ENCOUNTER — Other Ambulatory Visit: Payer: Medicare Other

## 2013-07-27 ENCOUNTER — Other Ambulatory Visit: Payer: Self-pay | Admitting: Internal Medicine

## 2013-07-27 DIAGNOSIS — I10 Essential (primary) hypertension: Secondary | ICD-10-CM

## 2013-07-27 DIAGNOSIS — E875 Hyperkalemia: Secondary | ICD-10-CM

## 2013-07-30 ENCOUNTER — Ambulatory Visit (INDEPENDENT_AMBULATORY_CARE_PROVIDER_SITE_OTHER): Payer: Medicare Other

## 2013-07-30 DIAGNOSIS — Z5181 Encounter for therapeutic drug level monitoring: Secondary | ICD-10-CM | POA: Diagnosis not present

## 2013-07-30 DIAGNOSIS — I4892 Unspecified atrial flutter: Secondary | ICD-10-CM | POA: Diagnosis not present

## 2013-07-30 LAB — POCT INR: INR: 2.3

## 2013-08-09 ENCOUNTER — Ambulatory Visit (INDEPENDENT_AMBULATORY_CARE_PROVIDER_SITE_OTHER): Payer: Medicare Other | Admitting: Pharmacist Clinician (PhC)/ Clinical Pharmacy Specialist

## 2013-08-09 DIAGNOSIS — I4892 Unspecified atrial flutter: Secondary | ICD-10-CM | POA: Diagnosis not present

## 2013-08-09 DIAGNOSIS — Z5181 Encounter for therapeutic drug level monitoring: Secondary | ICD-10-CM | POA: Diagnosis not present

## 2013-08-09 LAB — POCT INR: INR: 2

## 2013-08-23 ENCOUNTER — Ambulatory Visit (INDEPENDENT_AMBULATORY_CARE_PROVIDER_SITE_OTHER): Payer: Medicare Other | Admitting: *Deleted

## 2013-08-23 DIAGNOSIS — E291 Testicular hypofunction: Secondary | ICD-10-CM

## 2013-08-28 ENCOUNTER — Ambulatory Visit (INDEPENDENT_AMBULATORY_CARE_PROVIDER_SITE_OTHER): Payer: Medicare Other | Admitting: Internal Medicine

## 2013-08-28 VITALS — BP 161/78 | HR 56 | Ht 69.0 in | Wt 163.8 lb

## 2013-08-28 DIAGNOSIS — R001 Bradycardia, unspecified: Secondary | ICD-10-CM

## 2013-08-28 DIAGNOSIS — I4892 Unspecified atrial flutter: Secondary | ICD-10-CM

## 2013-08-28 DIAGNOSIS — I498 Other specified cardiac arrhythmias: Secondary | ICD-10-CM

## 2013-08-28 DIAGNOSIS — I428 Other cardiomyopathies: Secondary | ICD-10-CM | POA: Diagnosis not present

## 2013-08-28 NOTE — Patient Instructions (Signed)
Your physician recommends that you continue on your current medications as directed. Please refer to the Current Medication list given to you today.  Your physician wants you to follow-up in: 1 year with Dr. Klein.  You will receive a reminder letter in the mail two months in advance. If you don't receive a letter, please call our office to schedule the follow-up appointment.  

## 2013-08-28 NOTE — Progress Notes (Signed)
Patient Care Team: Neena Rhymes, MD as PCP - General Linton Rump, MD (Ophthalmology) [name], MD (Orthopedic Surgery) [name], MD (Cardiology) Adin Hector, MD (General Surgery)   HPI  [name] is a 80 y.o. male Seen in followup of atypical atrial flutter  Echo >>EF 20% <<50%  Prior PVI ([facility]-2007)>> Rx amiodarone+warfarin >>>TEEDCCV and amio dc as first recurrence  Seen last week with bradycardia and metop 50>>25  is a further decrease to 12.5.  Significant stressors related to his daughter and her psychiatric hospitalizations    HR slow but always is ;  No impairment in exercise tolerance  Past Medical History  Diagnosis Date  . AF (atrial fibrillation)     a. s/p RFCA/PVI @ [facility] clinic 2007.  Marland Kitchen Neoplasm of uncertain behavior     SKIN  . Hyperlipidemia   . Hypothyroidism   . HYPERTENSION, BENIGN 08/21/2009    Qualifier: Diagnosis of  By: Caryl Comes, MD, Remus Blake  Medications(s)  enalapril   . Polymyalgia rheumatica 01/13/2007    Qualifier: History of  By: Danny Lawless CMA, [place]     . Arthritis     "joints" (06/21/2013)  . Atrial flutter     a. 06/2013 s/p TEE/DCCV  . Chronic systolic CHF (congestive heart failure)     a. 06/2013 Echo: EF 20%, diff HK, mild LVH.    Past Surgical History  Procedure Laterality Date  . Hand surgery Right     "broke bone; grafted area w/bone from somewhere else"  . Cataract extraction, bilateral Bilateral Summer 2009  . Tonsillectomy    . Cholecystectomy    . Inguinal hernia repair Bilateral   . Atrial fibrillation ablation  2007  . Hemorrhoid surgery    . Tee without cardioversion N/A 06/22/2013    Procedure: TRANSESOPHAGEAL ECHOCARDIOGRAM (TEE);  Surgeon: Sueanne Margarita, MD;  Location: [facility];  Service: Cardiovascular;  Laterality: N/A;  . Cardioversion N/A 06/22/2013    Procedure: CARDIOVERSION;  Surgeon: Sueanne Margarita, MD;  Location: [facility];  Service:  Cardiovascular;  Laterality: N/A;  15:17 Lido 20mg ,IV , Propofol 30 mg, IV synched cardioversion @ 150 joules by Dr. Lajoyce Lauber from a flutter to sinis brady which is baseline for this pt, reports 45-50 reg HR verified by Dr. Ashok Norris    Current Outpatient Prescriptions  Medication Sig Dispense Refill  . atorvastatin (LIPITOR) 10 MG tablet Take 1 tablet (10 mg total) by mouth daily.  90 tablet  3  . carboxymethylcellulose (REFRESH TEARS) 0.5 % SOLN Place 1 drop into both eyes 3 (three) times daily as needed (dry eyes).       . cholecalciferol (VITAMIN D) 1000 UNITS tablet Take 1,000 Units by mouth every morning.      . enalapril (VASOTEC) 10 MG tablet Take 1 tablet (10 mg total) by mouth 2 (two) times daily.  60 tablet  0  . Glucosamine-Chondroitin 500-400 MG CAPS Take 1 capsule by mouth every morning.      . hydrochlorothiazide (HYDRODIURIL) 25 MG tablet Take 1 tablet (25 mg total) by mouth daily.  90 tablet  3  . levothyroxine (SYNTHROID) 50 MCG tablet Take 1 tablet (50 mcg total) by mouth daily.  90 tablet  3  . metoprolol succinate (TOPROL-XL) 25 MG 24 hr tablet Take 0.5 tablets (12.5 mg total) by mouth daily. Take with or immediately following a meal.  30 tablet  3  . Multiple Vitamins-Minerals (CENTRUM  SILVER PO) Take 1 tablet by mouth every morning.       . sildenafil (REVATIO) 20 MG tablet Take 40-100 mg by mouth daily as needed (erectile dusfunction).      Marland Kitchen testosterone cypionate (DEPOTESTOTERONE CYPIONATE) 200 MG/ML injection Inject 2 mLs (400 mg total) into the muscle every 28 (twenty-eight) days.  10 mL  1  . warfarin (COUMADIN) 2 MG tablet Take as directed by anticoagulation clinic  50 tablet  3   Current Facility-Administered Medications  Medication Dose Route Frequency Provider Last Rate Last Dose  . testosterone cypionate (DEPOTESTOTERONE CYPIONATE) injection 400 mg  400 mg Intramuscular Q28 days Neena Rhymes, MD   400 mg at 08/23/13 1416    No Known Allergies  Review of  Systems negative except from HPI and PMH  Physical Exam BP 161/78  Pulse 56  Ht 5\' 9"  (1.753 m)  Wt 163 lb 12.8 oz (74.299 kg)  BMI 24.18 kg/m2 Well developed and well nourished in no acute distress HENT normal E scleral and icterus clear Neck Supple JVP flat; carotids brisk and full Clear to ausculation  *Regular rate and rhythm, no murmurs gallops or rub Soft with active bowel sounds; ecchymosis No clubbing cyanosis none Edema Alert and oriented, grossly normal motor and sensory function Skin Warm and Dry  ECG  JUNCTIONAL RHYTHM WITH ISORHYTHMIC SINUS RHYTHM AT A RATE OF 56 Assessment and  Plan  Atrial Flutter  Junctional /sinus bradycardia  NICM thought to be rate related  Hypertension  Blood pressure has been intermittently very high. This is temporally related he thinks to the stresses involving his daughter who's had to be hospitalized for psychiatric illness couple of times in the last few weeks. He continues to have no complaints of exercise intolerance

## 2013-08-28 NOTE — Addendum Note (Signed)
Addended by: Stanton Kidney on: 08/28/2013 12:52 PM   Modules accepted: Orders

## 2013-08-30 ENCOUNTER — Ambulatory Visit (INDEPENDENT_AMBULATORY_CARE_PROVIDER_SITE_OTHER): Payer: Medicare Other

## 2013-08-30 DIAGNOSIS — I4892 Unspecified atrial flutter: Secondary | ICD-10-CM | POA: Diagnosis not present

## 2013-08-30 DIAGNOSIS — Z5181 Encounter for therapeutic drug level monitoring: Secondary | ICD-10-CM | POA: Diagnosis not present

## 2013-08-30 LAB — POCT INR: INR: 2.1

## 2013-09-04 ENCOUNTER — Ambulatory Visit (HOSPITAL_COMMUNITY): Payer: Medicare Other | Attending: Cardiology | Admitting: Radiology

## 2013-09-04 DIAGNOSIS — I4892 Unspecified atrial flutter: Secondary | ICD-10-CM

## 2013-09-04 DIAGNOSIS — I4891 Unspecified atrial fibrillation: Secondary | ICD-10-CM | POA: Diagnosis not present

## 2013-09-04 DIAGNOSIS — I428 Other cardiomyopathies: Secondary | ICD-10-CM

## 2013-09-04 DIAGNOSIS — I059 Rheumatic mitral valve disease, unspecified: Secondary | ICD-10-CM | POA: Insufficient documentation

## 2013-09-04 DIAGNOSIS — I509 Heart failure, unspecified: Secondary | ICD-10-CM | POA: Diagnosis not present

## 2013-09-04 DIAGNOSIS — R001 Bradycardia, unspecified: Secondary | ICD-10-CM

## 2013-09-04 DIAGNOSIS — I10 Essential (primary) hypertension: Secondary | ICD-10-CM | POA: Diagnosis not present

## 2013-09-04 NOTE — Progress Notes (Signed)
Echocardiogram performed.  

## 2013-09-20 ENCOUNTER — Ambulatory Visit (INDEPENDENT_AMBULATORY_CARE_PROVIDER_SITE_OTHER): Payer: Medicare Other | Admitting: *Deleted

## 2013-09-20 ENCOUNTER — Other Ambulatory Visit: Payer: Self-pay | Admitting: *Deleted

## 2013-09-20 DIAGNOSIS — E291 Testicular hypofunction: Secondary | ICD-10-CM | POA: Diagnosis not present

## 2013-09-20 MED ORDER — TESTOSTERONE CYPIONATE 200 MG/ML IM SOLN: 400.0000 mg | INTRAMUSCULAR | Status: DC

## 2013-09-21 ENCOUNTER — Telehealth: Payer: Self-pay | Admitting: Internal Medicine

## 2013-09-21 NOTE — Telephone Encounter (Signed)
Hi! Patient is a former [facility], does not est care with Dr. Jacinto Reap until 9/23.Marland Kitchenthe patient was told that he needs to schedule his testosterone injections here at [facility].  Ok to schedule injection for 10/18/13 at 2pm with you Montrice, pt has not established care yet?  Please advise.

## 2013-09-21 NOTE — Telephone Encounter (Signed)
Yes that is fine. I would prefer if he could come at 12 but if not 2pm is fine.

## 2013-09-21 NOTE — Telephone Encounter (Signed)
Pt scheduled for 10/18/13 at 12pm//kar

## 2013-09-27 ENCOUNTER — Telehealth: Payer: Self-pay

## 2013-09-27 ENCOUNTER — Ambulatory Visit (INDEPENDENT_AMBULATORY_CARE_PROVIDER_SITE_OTHER): Payer: Medicare Other

## 2013-09-27 DIAGNOSIS — I4892 Unspecified atrial flutter: Secondary | ICD-10-CM

## 2013-09-27 DIAGNOSIS — Z5181 Encounter for therapeutic drug level monitoring: Secondary | ICD-10-CM

## 2013-09-27 LAB — POCT INR: INR: 2.2

## 2013-09-27 NOTE — Telephone Encounter (Signed)
Pt seen in [facility]in Clinic today inquiring about when he is to stop taking the Coumadin.  Pt states he is no longer in aflutter and was under the impression Caryl Comes was going to discontinue the Coumadin at some point.  Reviewed Dr Olin Pia last OV note anticoagulation was not directly addressed.  Pt was instructed per last OV 08/28/13 to follow-up in 1 year.  Could you please review and address with pt.  Thanks

## 2013-09-27 NOTE — Telephone Encounter (Signed)
Advised to continue Coumadin secondary to past intermittent A Flutter. Pt is asking how long he thinks Dr. Caryl Comes will want him to continue medication. Will review question with Dr. Caryl Comes.

## 2013-10-01 ENCOUNTER — Telehealth: Payer: Self-pay | Admitting: Internal Medicine

## 2013-10-01 NOTE — Telephone Encounter (Signed)
Pt needs a new rx for testosterone cypionate (DEPOTESTOTERONE CYPIONATE) 200 MG/ML injection.  Pt did not pu the one for 5/21 that dr Ronnald Ramp ordered . Pharm would not fill b/c it requires a prior auth,  Since pt will see dr Elease Hashimoto, our office needs to do the prior auth . Pt brings rx w/ him to appt. He is scheduled 6/18 w/ you for test inj  Can you resend this [facility] to [facility] [address]. Marland Kitchen

## 2013-10-01 NOTE — Telephone Encounter (Signed)
Rx needs a prior authorization. [facility] is going to send the form over here to the office.

## 2013-10-03 NOTE — Telephone Encounter (Signed)
Advised to continue Coumadin, per Dr. Caryl Comes, and explained reasoning.

## 2013-10-12 NOTE — Telephone Encounter (Signed)
Pt is still waiting for PA

## 2013-10-15 NOTE — Telephone Encounter (Signed)
PA was submitted on 10/15/13 QIH#[id number]

## 2013-10-15 NOTE — Telephone Encounter (Signed)
PA was APPROVED, pt is aware.

## 2013-10-18 ENCOUNTER — Ambulatory Visit (INDEPENDENT_AMBULATORY_CARE_PROVIDER_SITE_OTHER): Payer: Medicare Other | Admitting: Family Medicine

## 2013-10-18 DIAGNOSIS — E291 Testicular hypofunction: Secondary | ICD-10-CM

## 2013-10-18 MED ORDER — TESTOSTERONE CYPIONATE 200 MG/ML IM SOLN
200.0000 mg | Freq: Once | INTRAMUSCULAR | Status: AC
  Administered 2013-10-18: 200 mg via INTRAMUSCULAR

## 2013-10-25 ENCOUNTER — Other Ambulatory Visit: Payer: Self-pay

## 2013-10-25 MED ORDER — ATORVASTATIN CALCIUM 10 MG PO TABS: 10.0000 mg | ORAL_TABLET | Freq: Every day | ORAL | Status: DC

## 2013-10-25 MED ORDER — LEVOTHYROXINE SODIUM 50 MCG PO TABS: 50.0000 ug | ORAL_TABLET | Freq: Every day | ORAL | Status: DC

## 2013-11-08 ENCOUNTER — Ambulatory Visit (INDEPENDENT_AMBULATORY_CARE_PROVIDER_SITE_OTHER): Payer: Medicare Other | Admitting: *Deleted

## 2013-11-08 DIAGNOSIS — I4892 Unspecified atrial flutter: Secondary | ICD-10-CM | POA: Diagnosis not present

## 2013-11-08 DIAGNOSIS — Z5181 Encounter for therapeutic drug level monitoring: Secondary | ICD-10-CM | POA: Diagnosis not present

## 2013-11-08 LAB — POCT INR: INR: 2.1

## 2013-11-12 ENCOUNTER — Other Ambulatory Visit: Payer: Self-pay | Admitting: *Deleted

## 2013-11-12 MED ORDER — WARFARIN SODIUM 2 MG PO TABS: ORAL_TABLET | ORAL | Status: DC

## 2013-11-15 ENCOUNTER — Ambulatory Visit (INDEPENDENT_AMBULATORY_CARE_PROVIDER_SITE_OTHER): Payer: Medicare Other | Admitting: Family Medicine

## 2013-11-15 DIAGNOSIS — E291 Testicular hypofunction: Secondary | ICD-10-CM | POA: Diagnosis not present

## 2013-11-15 MED ORDER — TESTOSTERONE CYPIONATE 200 MG/ML IM SOLN
400.0000 mg | Freq: Once | INTRAMUSCULAR | Status: AC
  Administered 2013-11-15: 400 mg via INTRAMUSCULAR

## 2013-12-13 ENCOUNTER — Ambulatory Visit (INDEPENDENT_AMBULATORY_CARE_PROVIDER_SITE_OTHER): Payer: Medicare Other | Admitting: Family Medicine

## 2013-12-13 DIAGNOSIS — E291 Testicular hypofunction: Secondary | ICD-10-CM | POA: Diagnosis not present

## 2013-12-13 MED ORDER — TESTOSTERONE CYPIONATE 200 MG/ML IM SOLN
400.0000 mg | Freq: Once | INTRAMUSCULAR | Status: AC
  Administered 2013-12-13: 400 mg via INTRAMUSCULAR

## 2013-12-13 MED ORDER — TESTOSTERONE CYPIONATE 200 MG/ML IM SOLN: 200.0000 mg | Freq: Once | INTRAMUSCULAR | Status: DC

## 2013-12-20 ENCOUNTER — Ambulatory Visit (INDEPENDENT_AMBULATORY_CARE_PROVIDER_SITE_OTHER): Payer: Medicare Other | Admitting: Pharmacist Clinician (PhC)/ Clinical Pharmacy Specialist

## 2013-12-20 DIAGNOSIS — I4892 Unspecified atrial flutter: Secondary | ICD-10-CM

## 2013-12-20 DIAGNOSIS — Z5181 Encounter for therapeutic drug level monitoring: Secondary | ICD-10-CM | POA: Diagnosis not present

## 2013-12-20 LAB — POCT INR: INR: 1.8

## 2013-12-27 DIAGNOSIS — H5210 Myopia, unspecified eye: Secondary | ICD-10-CM | POA: Diagnosis not present

## 2013-12-27 DIAGNOSIS — H26499 Other secondary cataract, unspecified eye: Secondary | ICD-10-CM | POA: Diagnosis not present

## 2014-01-17 ENCOUNTER — Ambulatory Visit (INDEPENDENT_AMBULATORY_CARE_PROVIDER_SITE_OTHER): Payer: Medicare Other

## 2014-01-17 ENCOUNTER — Ambulatory Visit (INDEPENDENT_AMBULATORY_CARE_PROVIDER_SITE_OTHER): Payer: Medicare Other | Admitting: Family Medicine

## 2014-01-17 DIAGNOSIS — I4892 Unspecified atrial flutter: Secondary | ICD-10-CM

## 2014-01-17 DIAGNOSIS — Z5181 Encounter for therapeutic drug level monitoring: Secondary | ICD-10-CM | POA: Diagnosis not present

## 2014-01-17 DIAGNOSIS — E291 Testicular hypofunction: Secondary | ICD-10-CM

## 2014-01-17 LAB — POCT INR: INR: 2

## 2014-01-17 MED ORDER — TESTOSTERONE CYPIONATE 200 MG/ML IM SOLN
400.0000 mg | Freq: Once | INTRAMUSCULAR | Status: AC
  Administered 2014-01-17: 400 mg via INTRAMUSCULAR

## 2014-01-23 ENCOUNTER — Encounter: Payer: Self-pay | Admitting: Family Medicine

## 2014-01-23 ENCOUNTER — Ambulatory Visit (INDEPENDENT_AMBULATORY_CARE_PROVIDER_SITE_OTHER): Payer: Medicare Other | Admitting: Family Medicine

## 2014-01-23 VITALS — BP 134/74 | HR 48 | Ht 69.0 in | Wt 166.0 lb

## 2014-01-23 DIAGNOSIS — Z8679 Personal history of other diseases of the circulatory system: Secondary | ICD-10-CM

## 2014-01-23 DIAGNOSIS — E039 Hypothyroidism, unspecified: Secondary | ICD-10-CM | POA: Diagnosis not present

## 2014-01-23 DIAGNOSIS — I10 Essential (primary) hypertension: Secondary | ICD-10-CM | POA: Diagnosis not present

## 2014-01-23 DIAGNOSIS — Z23 Encounter for immunization: Secondary | ICD-10-CM

## 2014-01-23 DIAGNOSIS — E785 Hyperlipidemia, unspecified: Secondary | ICD-10-CM

## 2014-01-23 DIAGNOSIS — E291 Testicular hypofunction: Secondary | ICD-10-CM

## 2014-01-23 DIAGNOSIS — Z79899 Other long term (current) drug therapy: Secondary | ICD-10-CM

## 2014-01-23 DIAGNOSIS — R3915 Urgency of urination: Secondary | ICD-10-CM

## 2014-01-23 LAB — HEPATIC FUNCTION PANEL
ALK PHOS: 51 U/L (ref 39–117)
ALT: 9 U/L (ref 0–53)
AST: 23 U/L (ref 0–37)
Albumin: 4.3 g/dL (ref 3.5–5.2)
Bilirubin, Direct: 0.1 mg/dL (ref 0.0–0.3)
TOTAL PROTEIN: 7.6 g/dL (ref 6.0–8.3)
Total Bilirubin: 0.6 mg/dL (ref 0.2–1.2)

## 2014-01-23 LAB — CBC WITH DIFFERENTIAL/PLATELET
Basophils Absolute: 0 10*3/uL (ref 0.0–0.1)
Basophils Relative: 0.2 % (ref 0.0–3.0)
EOS PCT: 1.6 % (ref 0.0–5.0)
Eosinophils Absolute: 0.1 10*3/uL (ref 0.0–0.7)
HCT: 46.4 % (ref 39.0–52.0)
Hemoglobin: 15.2 g/dL (ref 13.0–17.0)
LYMPHS PCT: 10.1 % — AB (ref 12.0–46.0)
Lymphs Abs: 0.8 10*3/uL (ref 0.7–4.0)
MCHC: 32.8 g/dL (ref 30.0–36.0)
MCV: 94.6 fl (ref 78.0–100.0)
Monocytes Absolute: 0.8 10*3/uL (ref 0.1–1.0)
Monocytes Relative: 10.2 % (ref 3.0–12.0)
NEUTROS PCT: 77.9 % — AB (ref 43.0–77.0)
Neutro Abs: 5.9 10*3/uL (ref 1.4–7.7)
PLATELETS: 164 10*3/uL (ref 150.0–400.0)
RBC: 4.9 Mil/uL (ref 4.22–5.81)
RDW: 13.6 % (ref 11.5–15.5)
WBC: 7.5 10*3/uL (ref 4.0–10.5)

## 2014-01-23 LAB — LIPID PANEL
CHOL/HDL RATIO: 4
Cholesterol: 132 mg/dL (ref 0–200)
HDL: 33.5 mg/dL — ABNORMAL LOW (ref >39.00)
LDL Cholesterol: 65 mg/dL (ref 0–99)
NONHDL: 98.5
Triglycerides: 168 mg/dL — ABNORMAL HIGH (ref 0.0–149.0)
VLDL: 33.6 mg/dL (ref 0.0–40.0)

## 2014-01-23 LAB — BASIC METABOLIC PANEL
BUN: 29 mg/dL — ABNORMAL HIGH (ref 6–23)
CHLORIDE: 102 meq/L (ref 96–112)
CO2: 31 mEq/L (ref 19–32)
CREATININE: 1.6 mg/dL — AB (ref 0.4–1.5)
Calcium: 9.2 mg/dL (ref 8.4–10.5)
GFR: 45.07 mL/min — ABNORMAL LOW (ref >60.00)
GLUCOSE: 83 mg/dL (ref 70–99)
POTASSIUM: 5.3 meq/L — AB (ref 3.5–5.1)
Sodium: 137 mEq/L (ref 135–145)

## 2014-01-23 LAB — TSH: TSH: 0.67 u[IU]/mL (ref 0.35–4.50)

## 2014-01-23 LAB — TESTOSTERONE: Testosterone: 1140.58 ng/dL — ABNORMAL HIGH (ref 300.00–890.00)

## 2014-01-23 MED ORDER — TESTOSTERONE CYPIONATE 200 MG/ML IM SOLN: 400.0000 mg | INTRAMUSCULAR | Status: DC

## 2014-01-23 NOTE — Progress Notes (Signed)
Pre visit review using our clinic review tool, if applicable. No additional management support is needed unless otherwise documented below in the visit note. 

## 2014-01-23 NOTE — Progress Notes (Signed)
Subjective:    Patient ID: [name], male    DOB: 07-10-1925, 80 y.o.   MRN: [id number]  HPI  Patient here to transfer care. Very pleasant 80 year old male with history of atrial fibrillation, diastolic and systolic heart failure, hypertension, hyperlipidemia, hypothyroidism, hypogonadism, and urinary urgency. Medications reviewed. He's had previous ablation procedure at [facility] clinic for atrial fibrillation and has had 2 previous cardioversions. He is on chronic Coumadin. Medications reviewed. Compliant with all. Denies any recent chest pains. No dizziness. No recent falls.  He takes atorvastatin for hyperlipidemia. He is on thyroid replacement. Needs followup labs. Requesting refills of testosterone. No history of prostate cancer. No obstructive urinary symptoms.  Past Medical History  Diagnosis Date  . AF (atrial fibrillation)     a. s/p RFCA/PVI @ [facility] clinic 2007.  Marland Kitchen Neoplasm of uncertain behavior     SKIN  . Hyperlipidemia   . Hypothyroidism   . HYPERTENSION, BENIGN 08/21/2009    Qualifier: Diagnosis of  By: Caryl Comes, MD, Remus Blake  Medications(s)  enalapril   . Polymyalgia rheumatica 01/13/2007    Qualifier: History of  By: Danny Lawless CMA, [place]     . Arthritis     "joints" (06/21/2013)  . Atrial flutter     a. 06/2013 s/p TEE/DCCV  . Chronic systolic CHF (congestive heart failure)     a. 06/2013 Echo: EF 20%, diff HK, mild LVH.   Past Surgical History  Procedure Laterality Date  . Hand surgery Right     "broke bone; grafted area w/bone from somewhere else"  . Cataract extraction, bilateral Bilateral Summer 2009  . Tonsillectomy    . Cholecystectomy    . Inguinal hernia repair Bilateral   . Atrial fibrillation ablation  2007  . Hemorrhoid surgery    . Tee without cardioversion N/A 06/22/2013    Procedure: TRANSESOPHAGEAL ECHOCARDIOGRAM (TEE);  Surgeon: Sueanne Margarita, MD;  Location: [place];  Service: Cardiovascular;  Laterality: N/A;  .  Cardioversion N/A 06/22/2013    Procedure: CARDIOVERSION;  Surgeon: Sueanne Margarita, MD;  Location: [facility];  Service: Cardiovascular;  Laterality: N/A;  15:17 Lido 20mg ,IV , Propofol 30 mg, IV synched cardioversion @ 150 joules by Dr. Lajoyce Lauber from a flutter to sinis brady which is baseline for this pt, reports 45-50 reg HR verified by Dr. Ashok Norris    reports that he quit smoking about 50 years ago. His smoking use included Cigarettes. He has a 12 pack-year smoking history. He has never used smokeless tobacco. He reports that he drinks alcohol. He reports that he does not use illicit drugs. family history includes Diabetes in his mother; Emphysema in his mother; Kidney disease in his father. No Known Allergies   Review of Systems  Constitutional: Negative for appetite change, fatigue and unexpected weight change.  Eyes: Negative for visual disturbance.  Respiratory: Negative for cough, chest tightness and shortness of breath.   Cardiovascular: Negative for chest pain, palpitations and leg swelling.  Gastrointestinal: Negative for abdominal pain.  Endocrine: Negative for polydipsia and polyuria.  Neurological: Negative for dizziness, syncope, weakness, light-headedness and headaches.       Objective:   Physical Exam  Constitutional: He is oriented to person, place, and time. He appears well-developed and well-nourished.  HENT:  Right Ear: External ear normal.  Left Ear: External ear normal.  Mouth/Throat: Oropharynx is clear and moist.  Neck: Neck supple.  Cardiovascular: Normal rate and regular rhythm.  Exam reveals no gallop.  Pulmonary/Chest: Effort normal and breath sounds normal. No respiratory distress. He has no wheezes. He has no rales.  Musculoskeletal: He exhibits no edema.  Neurological: He is alert and oriented to person, place, and time.          Assessment & Plan:  #1 history of recurrent atrial fibrillation. Currently sinus rhythm. Continue Coumadin #2  hypertension which is stable and controlled. Continue current medications. Check basic metabolic panel #3 hypothyroidism. Check TSH.  #4 hyperlipidemia. Check lipid and hepatic panel #5 hypogonadism. Patient has been on testosterone replacement for quite some time. We discussed pros and cons of replacement. Monitor CBC and repeat testosterone level. #6 health maintenance. Flu vaccine given. He's had previous Prevnar 13 last fall

## 2014-01-24 NOTE — Addendum Note (Signed)
Addended by: Marcina Millard on: 01/24/2014 12:36 PM   Modules accepted: Orders

## 2014-02-07 ENCOUNTER — Other Ambulatory Visit: Payer: Self-pay

## 2014-02-07 DIAGNOSIS — R001 Bradycardia, unspecified: Secondary | ICD-10-CM

## 2014-02-07 MED ORDER — METOPROLOL SUCCINATE ER 25 MG PO TB24: 12.5000 mg | ORAL_TABLET | Freq: Every day | ORAL | Status: DC

## 2014-02-13 ENCOUNTER — Other Ambulatory Visit: Payer: Self-pay

## 2014-02-13 DIAGNOSIS — R001 Bradycardia, unspecified: Secondary | ICD-10-CM

## 2014-02-13 MED ORDER — METOPROLOL SUCCINATE ER 25 MG PO TB24: 12.5000 mg | ORAL_TABLET | Freq: Every day | ORAL | Status: DC

## 2014-02-14 ENCOUNTER — Ambulatory Visit (INDEPENDENT_AMBULATORY_CARE_PROVIDER_SITE_OTHER): Payer: Medicare Other | Admitting: Pharmacist

## 2014-02-14 ENCOUNTER — Ambulatory Visit (INDEPENDENT_AMBULATORY_CARE_PROVIDER_SITE_OTHER): Payer: Medicare Other | Admitting: Family Medicine

## 2014-02-14 DIAGNOSIS — I4892 Unspecified atrial flutter: Secondary | ICD-10-CM | POA: Diagnosis not present

## 2014-02-14 DIAGNOSIS — Z5181 Encounter for therapeutic drug level monitoring: Secondary | ICD-10-CM | POA: Diagnosis not present

## 2014-02-14 DIAGNOSIS — E291 Testicular hypofunction: Secondary | ICD-10-CM

## 2014-02-14 LAB — POCT INR: INR: 1.6

## 2014-02-14 MED ORDER — TESTOSTERONE CYPIONATE 200 MG/ML IM SOLN
400.0000 mg | Freq: Once | INTRAMUSCULAR | Status: AC
  Administered 2014-02-14: 400 mg via INTRAMUSCULAR

## 2014-02-18 ENCOUNTER — Ambulatory Visit: Payer: Medicare Other | Admitting: Family Medicine

## 2014-03-07 ENCOUNTER — Ambulatory Visit (INDEPENDENT_AMBULATORY_CARE_PROVIDER_SITE_OTHER): Payer: Medicare Other | Admitting: *Deleted

## 2014-03-07 DIAGNOSIS — I4892 Unspecified atrial flutter: Secondary | ICD-10-CM

## 2014-03-07 DIAGNOSIS — Z5181 Encounter for therapeutic drug level monitoring: Secondary | ICD-10-CM | POA: Diagnosis not present

## 2014-03-07 LAB — POCT INR: INR: 1.9

## 2014-03-14 ENCOUNTER — Ambulatory Visit (INDEPENDENT_AMBULATORY_CARE_PROVIDER_SITE_OTHER): Payer: Medicare Other | Admitting: Family Medicine

## 2014-03-14 DIAGNOSIS — E291 Testicular hypofunction: Secondary | ICD-10-CM | POA: Diagnosis not present

## 2014-03-14 MED ORDER — TESTOSTERONE CYPIONATE 200 MG/ML IM SOLN
400.0000 mg | Freq: Once | INTRAMUSCULAR | Status: AC
  Administered 2014-03-14: 400 mg via INTRAMUSCULAR

## 2014-03-26 ENCOUNTER — Ambulatory Visit (INDEPENDENT_AMBULATORY_CARE_PROVIDER_SITE_OTHER): Payer: Medicare Other | Admitting: *Deleted

## 2014-03-26 DIAGNOSIS — I4892 Unspecified atrial flutter: Secondary | ICD-10-CM

## 2014-03-26 DIAGNOSIS — Z5181 Encounter for therapeutic drug level monitoring: Secondary | ICD-10-CM

## 2014-03-26 LAB — POCT INR: INR: 2.1

## 2014-04-08 ENCOUNTER — Other Ambulatory Visit: Payer: Self-pay

## 2014-04-08 MED ORDER — WARFARIN SODIUM 2 MG PO TABS: ORAL_TABLET | ORAL | Status: DC

## 2014-04-11 ENCOUNTER — Ambulatory Visit (INDEPENDENT_AMBULATORY_CARE_PROVIDER_SITE_OTHER): Payer: Medicare Other | Admitting: Family Medicine

## 2014-04-11 DIAGNOSIS — E291 Testicular hypofunction: Secondary | ICD-10-CM | POA: Diagnosis not present

## 2014-04-11 MED ORDER — TESTOSTERONE CYPIONATE 200 MG/ML IM SOLN
200.0000 mg | Freq: Once | INTRAMUSCULAR | Status: AC
  Administered 2014-04-11: 200 mg via INTRAMUSCULAR

## 2014-04-23 ENCOUNTER — Ambulatory Visit (INDEPENDENT_AMBULATORY_CARE_PROVIDER_SITE_OTHER): Payer: Medicare Other | Admitting: *Deleted

## 2014-04-23 DIAGNOSIS — Z5181 Encounter for therapeutic drug level monitoring: Secondary | ICD-10-CM | POA: Diagnosis not present

## 2014-04-23 DIAGNOSIS — I4892 Unspecified atrial flutter: Secondary | ICD-10-CM

## 2014-04-23 LAB — POCT INR: INR: 2.2

## 2014-05-03 DIAGNOSIS — J22 Unspecified acute lower respiratory infection: Secondary | ICD-10-CM | POA: Diagnosis not present

## 2014-05-09 ENCOUNTER — Ambulatory Visit (INDEPENDENT_AMBULATORY_CARE_PROVIDER_SITE_OTHER): Payer: Medicare Other | Admitting: Family Medicine

## 2014-05-09 DIAGNOSIS — E291 Testicular hypofunction: Secondary | ICD-10-CM | POA: Diagnosis not present

## 2014-05-09 MED ORDER — TESTOSTERONE CYPIONATE 200 MG/ML IM SOLN
400.0000 mg | Freq: Once | INTRAMUSCULAR | Status: AC
  Administered 2014-05-09: 400 mg via INTRAMUSCULAR

## 2014-05-21 ENCOUNTER — Ambulatory Visit (INDEPENDENT_AMBULATORY_CARE_PROVIDER_SITE_OTHER): Payer: Medicare Other | Admitting: *Deleted

## 2014-05-21 DIAGNOSIS — I4892 Unspecified atrial flutter: Secondary | ICD-10-CM

## 2014-05-21 DIAGNOSIS — Z5181 Encounter for therapeutic drug level monitoring: Secondary | ICD-10-CM

## 2014-05-21 LAB — POCT INR: INR: 2.2

## 2014-06-06 ENCOUNTER — Ambulatory Visit (INDEPENDENT_AMBULATORY_CARE_PROVIDER_SITE_OTHER): Payer: Medicare Other | Admitting: Family Medicine

## 2014-06-06 DIAGNOSIS — E291 Testicular hypofunction: Secondary | ICD-10-CM | POA: Diagnosis not present

## 2014-06-06 MED ORDER — TESTOSTERONE CYPIONATE 200 MG/ML IM SOLN
400.0000 mg | Freq: Once | INTRAMUSCULAR | Status: AC
  Administered 2014-06-06: 400 mg via INTRAMUSCULAR

## 2014-06-19 DIAGNOSIS — R350 Frequency of micturition: Secondary | ICD-10-CM | POA: Diagnosis not present

## 2014-06-19 DIAGNOSIS — N3941 Urge incontinence: Secondary | ICD-10-CM | POA: Diagnosis not present

## 2014-06-19 DIAGNOSIS — N5201 Erectile dysfunction due to arterial insufficiency: Secondary | ICD-10-CM | POA: Diagnosis not present

## 2014-07-02 ENCOUNTER — Ambulatory Visit (INDEPENDENT_AMBULATORY_CARE_PROVIDER_SITE_OTHER): Payer: Medicare Other | Admitting: *Deleted

## 2014-07-02 DIAGNOSIS — I4892 Unspecified atrial flutter: Secondary | ICD-10-CM

## 2014-07-02 DIAGNOSIS — Z5181 Encounter for therapeutic drug level monitoring: Secondary | ICD-10-CM

## 2014-07-02 LAB — POCT INR: INR: 1.6

## 2014-07-04 ENCOUNTER — Ambulatory Visit (INDEPENDENT_AMBULATORY_CARE_PROVIDER_SITE_OTHER): Payer: Medicare Other | Admitting: Family Medicine

## 2014-07-04 ENCOUNTER — Other Ambulatory Visit: Payer: Self-pay

## 2014-07-04 DIAGNOSIS — E349 Endocrine disorder, unspecified: Secondary | ICD-10-CM

## 2014-07-04 DIAGNOSIS — E291 Testicular hypofunction: Secondary | ICD-10-CM | POA: Diagnosis not present

## 2014-07-04 MED ORDER — HYDROCHLOROTHIAZIDE 25 MG PO TABS: 25.0000 mg | ORAL_TABLET | Freq: Every day | ORAL | Status: DC

## 2014-07-04 MED ORDER — TESTOSTERONE CYPIONATE 200 MG/ML IM SOLN
200.0000 mg | Freq: Once | INTRAMUSCULAR | Status: AC
  Administered 2014-07-04: 200 mg via INTRAMUSCULAR

## 2014-07-16 ENCOUNTER — Ambulatory Visit (INDEPENDENT_AMBULATORY_CARE_PROVIDER_SITE_OTHER): Payer: Medicare Other | Admitting: *Deleted

## 2014-07-16 DIAGNOSIS — I4892 Unspecified atrial flutter: Secondary | ICD-10-CM

## 2014-07-16 DIAGNOSIS — Z5181 Encounter for therapeutic drug level monitoring: Secondary | ICD-10-CM

## 2014-07-16 LAB — POCT INR: INR: 2

## 2014-07-24 ENCOUNTER — Ambulatory Visit (INDEPENDENT_AMBULATORY_CARE_PROVIDER_SITE_OTHER): Payer: Medicare Other | Admitting: Family Medicine

## 2014-07-24 ENCOUNTER — Encounter: Payer: Self-pay | Admitting: Family Medicine

## 2014-07-24 VITALS — BP 128/70 | HR 60 | Temp 97.2°F | Wt 166.0 lb

## 2014-07-24 DIAGNOSIS — I10 Essential (primary) hypertension: Secondary | ICD-10-CM

## 2014-07-24 DIAGNOSIS — I4892 Unspecified atrial flutter: Secondary | ICD-10-CM | POA: Diagnosis not present

## 2014-07-24 DIAGNOSIS — E038 Other specified hypothyroidism: Secondary | ICD-10-CM | POA: Diagnosis not present

## 2014-07-24 DIAGNOSIS — E291 Testicular hypofunction: Secondary | ICD-10-CM

## 2014-07-24 LAB — TESTOSTERONE: Testosterone: 148.07 ng/dL — ABNORMAL LOW (ref 300.00–890.00)

## 2014-07-24 NOTE — Progress Notes (Signed)
Subjective:    Patient ID: [name], male    DOB: 1925-08-09, 81 y.o.   MRN: [id number]  HPI Patient here for routine six-month follow-up. He has history of atrial fibrillation, systolic heart failure, chronic Coumadin, urinary urgency, hypogonadism, hypertension, hypothyroidism. He has history of low testosterone and came to [place] on replacement with 400 mg monthly. Recent testosterone level was high. Recent CBC last fall was normal.  On thyroid replacement. TSH last fall normal. No recent falls. Gets Coumadin monitored regularly. No specific complaints today.  Past Medical History  Diagnosis Date  . AF (atrial fibrillation)     a. s/p RFCA/PVI @ [facility] clinic 2007.  Marland Kitchen Neoplasm of uncertain behavior     SKIN  . Hyperlipidemia   . Hypothyroidism   . HYPERTENSION, BENIGN 08/21/2009    Qualifier: Diagnosis of  By: Caryl Comes, MD, Remus Blake  Medications(s)  enalapril   . Polymyalgia rheumatica 01/13/2007    Qualifier: History of  By: Danny Lawless CMA, [place]     . Arthritis     "joints" (06/21/2013)  . Atrial flutter     a. 06/2013 s/p TEE/DCCV  . Chronic systolic CHF (congestive heart failure)     a. 06/2013 Echo: EF 20%, diff HK, mild LVH.   Past Surgical History  Procedure Laterality Date  . Hand surgery Right     "broke bone; grafted area w/bone from somewhere else"  . Cataract extraction, bilateral Bilateral Summer 2009  . Tonsillectomy    . Cholecystectomy    . Inguinal hernia repair Bilateral   . Atrial fibrillation ablation  2007  . Hemorrhoid surgery    . Tee without cardioversion N/A 06/22/2013    Procedure: TRANSESOPHAGEAL ECHOCARDIOGRAM (TEE);  Surgeon: Sueanne Margarita, MD;  Location: [place];  Service: Cardiovascular;  Laterality: N/A;  . Cardioversion N/A 06/22/2013    Procedure: CARDIOVERSION;  Surgeon: Sueanne Margarita, MD;  Location: [facility];  Service: Cardiovascular;  Laterality: N/A;  15:17 Lido 20mg ,IV , Propofol 30 mg, IV synched cardioversion @  150 joules by Dr. Lajoyce Lauber from a flutter to sinis brady which is baseline for this pt, reports 45-50 reg HR verified by Dr. Ashok Norris    reports that he quit smoking about 51 years ago. His smoking use included Cigarettes. He has a 12 pack-year smoking history. He has never used smokeless tobacco. He reports that he drinks alcohol. He reports that he does not use illicit drugs. family history includes Diabetes in his mother; Emphysema in his mother; Kidney disease in his father. No Known Allergies    Review of Systems  Constitutional: Negative for fatigue and unexpected weight change.  Eyes: Negative for visual disturbance.  Respiratory: Negative for cough, chest tightness and shortness of breath.   Cardiovascular: Negative for chest pain, palpitations and leg swelling.  Endocrine: Negative for polydipsia and polyuria.  Neurological: Negative for dizziness, syncope, weakness, light-headedness and headaches.       Objective:   Physical Exam  Constitutional: He appears well-developed and well-nourished.  Neck: Neck supple. No thyromegaly present.  Cardiovascular: Normal rate and regular rhythm.   Pulmonary/Chest: Effort normal and breath sounds normal. No respiratory distress. He has no wheezes. He has no rales.  Musculoskeletal: He exhibits no edema.  Neurological: He is alert.          Assessment & Plan:  #1 hypogonadism. Recheck testosterone level which was recently over place. We adjusted him from 400-300 mg once monthly.  Yearly CBC to screen  for polycythemia. #2 hypothyroidism. TSH at goal in September. Plan to recheck in 6 months  #3 hypertension which is stable and at goal #4 history of atrial fibrillation/flutter. Currently sinus rhythm- clinically today.

## 2014-07-24 NOTE — Progress Notes (Signed)
Pre visit review using our clinic review tool, if applicable. No additional management support is needed unless otherwise documented below in the visit note. 

## 2014-07-25 ENCOUNTER — Telehealth: Payer: Self-pay | Admitting: Family Medicine

## 2014-07-25 NOTE — Telephone Encounter (Signed)
emmi emailed °

## 2014-08-01 ENCOUNTER — Ambulatory Visit (INDEPENDENT_AMBULATORY_CARE_PROVIDER_SITE_OTHER): Payer: Medicare Other | Admitting: Family Medicine

## 2014-08-01 DIAGNOSIS — E291 Testicular hypofunction: Secondary | ICD-10-CM | POA: Diagnosis not present

## 2014-08-01 MED ORDER — TESTOSTERONE CYPIONATE 200 MG/ML IM SOLN
200.0000 mg | Freq: Once | INTRAMUSCULAR | Status: AC
  Administered 2014-08-01: 200 mg via INTRAMUSCULAR

## 2014-08-13 ENCOUNTER — Ambulatory Visit (INDEPENDENT_AMBULATORY_CARE_PROVIDER_SITE_OTHER): Payer: Medicare Other | Admitting: *Deleted

## 2014-08-13 DIAGNOSIS — Z5181 Encounter for therapeutic drug level monitoring: Secondary | ICD-10-CM

## 2014-08-13 DIAGNOSIS — I4892 Unspecified atrial flutter: Secondary | ICD-10-CM

## 2014-08-13 LAB — POCT INR: INR: 1.9

## 2014-08-13 MED ORDER — WARFARIN SODIUM 2 MG PO TABS: ORAL_TABLET | ORAL | Status: DC

## 2014-08-15 ENCOUNTER — Ambulatory Visit (INDEPENDENT_AMBULATORY_CARE_PROVIDER_SITE_OTHER): Payer: Medicare Other | Admitting: Family Medicine

## 2014-08-15 ENCOUNTER — Telehealth: Payer: Self-pay

## 2014-08-15 DIAGNOSIS — E291 Testicular hypofunction: Secondary | ICD-10-CM | POA: Diagnosis not present

## 2014-08-15 MED ORDER — TESTOSTERONE CYPIONATE 200 MG/ML IM SOLN
200.0000 mg | Freq: Once | INTRAMUSCULAR | Status: AC
  Administered 2014-08-15: 200 mg via INTRAMUSCULAR

## 2014-08-15 MED ORDER — TESTOSTERONE CYPIONATE 200 MG/ML IM SOLN: 300.0000 mg | INTRAMUSCULAR | Status: DC

## 2014-08-15 NOTE — Telephone Encounter (Signed)
Testosterone  Last visit 07/24/14 Last refill 01/23/14 62ml 0 refill   [facility]

## 2014-08-15 NOTE — Telephone Encounter (Signed)
Faxed Rx to pharmacy  

## 2014-08-15 NOTE — Telephone Encounter (Signed)
Refill okay?  

## 2014-08-27 ENCOUNTER — Ambulatory Visit (INDEPENDENT_AMBULATORY_CARE_PROVIDER_SITE_OTHER): Payer: Medicare Other | Admitting: *Deleted

## 2014-08-27 DIAGNOSIS — Z5181 Encounter for therapeutic drug level monitoring: Secondary | ICD-10-CM | POA: Diagnosis not present

## 2014-08-27 DIAGNOSIS — I4892 Unspecified atrial flutter: Secondary | ICD-10-CM | POA: Diagnosis not present

## 2014-08-27 LAB — POCT INR: INR: 1.9

## 2014-08-29 ENCOUNTER — Ambulatory Visit (INDEPENDENT_AMBULATORY_CARE_PROVIDER_SITE_OTHER): Payer: Medicare Other | Admitting: Family Medicine

## 2014-08-29 DIAGNOSIS — E291 Testicular hypofunction: Secondary | ICD-10-CM | POA: Diagnosis not present

## 2014-08-29 MED ORDER — TESTOSTERONE CYPIONATE 200 MG/ML IM SOLN
300.0000 mg | Freq: Once | INTRAMUSCULAR | Status: AC
  Administered 2014-08-29: 300 mg via INTRAMUSCULAR

## 2014-09-03 ENCOUNTER — Other Ambulatory Visit: Payer: Self-pay | Admitting: *Deleted

## 2014-09-03 MED ORDER — ENALAPRIL MALEATE 10 MG PO TABS: 10.0000 mg | ORAL_TABLET | Freq: Two times a day (BID) | ORAL | Status: DC

## 2014-09-10 ENCOUNTER — Ambulatory Visit (INDEPENDENT_AMBULATORY_CARE_PROVIDER_SITE_OTHER): Payer: Medicare Other | Admitting: *Deleted

## 2014-09-10 DIAGNOSIS — Z5181 Encounter for therapeutic drug level monitoring: Secondary | ICD-10-CM | POA: Diagnosis not present

## 2014-09-10 DIAGNOSIS — I4892 Unspecified atrial flutter: Secondary | ICD-10-CM

## 2014-09-10 LAB — POCT INR: INR: 2.4

## 2014-09-12 ENCOUNTER — Ambulatory Visit (INDEPENDENT_AMBULATORY_CARE_PROVIDER_SITE_OTHER): Payer: Medicare Other | Admitting: Family Medicine

## 2014-09-12 DIAGNOSIS — E291 Testicular hypofunction: Secondary | ICD-10-CM | POA: Diagnosis not present

## 2014-09-12 MED ORDER — TESTOSTERONE CYPIONATE 200 MG/ML IM SOLN
200.0000 mg | Freq: Once | INTRAMUSCULAR | Status: AC
  Administered 2014-09-12: 200 mg via INTRAMUSCULAR

## 2014-09-20 ENCOUNTER — Ambulatory Visit (INDEPENDENT_AMBULATORY_CARE_PROVIDER_SITE_OTHER): Payer: Medicare Other | Admitting: Internal Medicine

## 2014-09-20 ENCOUNTER — Encounter: Payer: Self-pay | Admitting: Internal Medicine

## 2014-09-20 DIAGNOSIS — R0609 Other forms of dyspnea: Secondary | ICD-10-CM | POA: Diagnosis not present

## 2014-09-20 DIAGNOSIS — I484 Atypical atrial flutter: Secondary | ICD-10-CM

## 2014-09-20 DIAGNOSIS — R001 Bradycardia, unspecified: Secondary | ICD-10-CM

## 2014-09-20 NOTE — Patient Instructions (Addendum)
Medication Instructions:  Your physician recommends that you continue on your current medications as directed. Please refer to the Current Medication list given to you today.    Labwork: NONE  Testing/Procedures: NONE   Follow-Up: Your physician wants you to follow-up in: 12 months with Dr. Gari Crown will receive a reminder letter in the mail two months in advance. If you don't receive a letter, please call our office to schedule the follow-up appointment.  Any Other Special Instructions Will Be Listed Below (If Applicable). NONE

## 2014-09-20 NOTE — Progress Notes (Signed)
Patient Care Team: [name], MD as PCP - General (Family Medicine) [name], MD (Ophthalmology) [name], MD (Orthopedic Surgery) [name], MD (Cardiology) [name], MD (General Surgery)   HPI  [name] is a 81 y.o. male Seen in followup of atypical atrial flutter  Echo >>EF 20% <<50% Repeat echo 5/15 demonstrated normal left ventricular function  Prior PVI ([facility]-2007)>> Rx amiodarone+warfarin >>>TEEDCCV and amio dc as first recurrence  Has a history of bradycardia.  Significant stressors related to his daughter and her psychiatric hospitalizations  HR slow but always is; he has noted some increasing problems with dyspnea. It is unassociated with chest discomfort or peripheral edema. It is not too disruptive.  Past Medical History  Diagnosis Date  . AF (atrial fibrillation)     a. s/p RFCA/PVI @ [facility] clinic 2007.  Marland Kitchen Neoplasm of uncertain behavior     SKIN  . Hyperlipidemia   . Hypothyroidism   . HYPERTENSION, BENIGN 08/21/2009    Qualifier: Diagnosis of  By: Caryl Comes, MD, Remus Blake  Medications(s)  enalapril   . Polymyalgia rheumatica 01/13/2007    Qualifier: History of  By: Danny Lawless CMA, [place]     . Arthritis     "joints" (06/21/2013)  . Atrial flutter     a. 06/2013 s/p TEE/DCCV  . Chronic systolic CHF (congestive heart failure)     a. 06/2013 Echo: EF 20%, diff HK, mild LVH.    Past Surgical History  Procedure Laterality Date  . Hand surgery Right     "broke bone; grafted area w/bone from somewhere else"  . Cataract extraction, bilateral Bilateral Summer 2009  . Tonsillectomy    . Cholecystectomy    . Inguinal hernia repair Bilateral   . Atrial fibrillation ablation  2007  . Hemorrhoid surgery    . Tee without cardioversion N/A 06/22/2013    Procedure: TRANSESOPHAGEAL ECHOCARDIOGRAM (TEE);  Surgeon: Sueanne Margarita, MD;  Location: [place];  Service: Cardiovascular;  Laterality: N/A;  .  Cardioversion N/A 06/22/2013    Procedure: CARDIOVERSION;  Surgeon: Sueanne Margarita, MD;  Location: [facility];  Service: Cardiovascular;  Laterality: N/A;  15:17 Lido 20mg ,IV , Propofol 30 mg, IV synched cardioversion @ 150 joules by Dr. Lajoyce Lauber from a flutter to sinis brady which is baseline for this pt, reports 45-50 reg HR verified by Dr. Ashok Norris    Current Outpatient Prescriptions  Medication Sig Dispense Refill  . atorvastatin (LIPITOR) 10 MG tablet Take 1 tablet (10 mg total) by mouth daily. 90 tablet 3  . carboxymethylcellulose (REFRESH TEARS) 0.5 % SOLN Place 1 drop into both eyes 3 (three) times daily as needed (dry eyes).     . cholecalciferol (VITAMIN D) 1000 UNITS tablet Take 1,000 Units by mouth every morning.    . enalapril (VASOTEC) 10 MG tablet Take 1 tablet (10 mg total) by mouth 2 (two) times daily. 60 tablet 0  . Glucosamine-Chondroitin 500-400 MG CAPS Take 1 capsule by mouth every morning.    . hydrochlorothiazide (HYDRODIURIL) 25 MG tablet Take 1 tablet (25 mg total) by mouth daily. 90 tablet 1  . levothyroxine (SYNTHROID) 50 MCG tablet Take 1 tablet (50 mcg total) by mouth daily. 90 tablet 3  . metoprolol succinate (TOPROL-XL) 25 MG 24 hr tablet Take 0.5 tablets (12.5 mg total) by mouth daily. Take with or immediately following a meal. 30 tablet 6  . Multiple Vitamins-Minerals (CENTRUM SILVER PO) Take 1 tablet  by mouth every morning.     . Omega-3 Fatty Acids (FISH OIL) 1200 MG CAPS Take by mouth daily after supper.    . sildenafil (REVATIO) 20 MG tablet Take 40-100 mg by mouth daily as needed (erectile dusfunction).    Marland Kitchen testosterone cypionate (DEPOTESTOTERONE CYPIONATE) 200 MG/ML injection Inject 1.5 mLs (300 mg total) into the muscle every 28 (twenty-eight) days. 10 mL 0  . warfarin (COUMADIN) 2 MG tablet Take as directed by anticoagulation clinic 180 tablet 0   No current facility-administered medications for this visit.    No Known Allergies  Review of Systems  negative except from HPI and PMH  Physical Exam BP 154/72 mmHg  Pulse 52  Ht 5\' 9"  (1.753 m)  Wt 169 lb 12.8 oz (77.021 kg)  BMI 25.06 kg/m2 Well developed and well nourished in no acute distress HENT normal E scleral and icterus clear Neck Supple JVP flat; carotids brisk and full Clear to ausculation  *Regular rate and rhythm, no murmurs gallops or rub Soft with active bowel sounds; ecchymosis No clubbing cyanosis none Edema Alert and oriented, grossly normal motor and sensory function Skin Warm and Dry  ECG  Sinus rhythm at 52 Intervals 15/10/39  Assessment and  Plan  Atrial Flutter  No intercurrent atrial fibrillation or flutter   sinus bradycardia  NICM thought to be rate related  Resolved  Dyspnea on exertion  Hypertension  Blood pressure is not unreasonable. He is doing better with the stresses of his daughter.Given his bradycardia, we will discontinue his metoprolol.  He is euvolemic. At this point we'll not pursue further evaluation of his dyspnea.

## 2014-09-26 ENCOUNTER — Ambulatory Visit (INDEPENDENT_AMBULATORY_CARE_PROVIDER_SITE_OTHER): Payer: Medicare Other | Admitting: Family Medicine

## 2014-09-26 DIAGNOSIS — E291 Testicular hypofunction: Secondary | ICD-10-CM | POA: Diagnosis not present

## 2014-09-26 MED ORDER — TESTOSTERONE CYPIONATE 200 MG/ML IM SOLN
200.0000 mg | Freq: Once | INTRAMUSCULAR | Status: AC
  Administered 2014-09-26: 200 mg via INTRAMUSCULAR

## 2014-10-01 ENCOUNTER — Ambulatory Visit (INDEPENDENT_AMBULATORY_CARE_PROVIDER_SITE_OTHER): Payer: Medicare Other | Admitting: *Deleted

## 2014-10-01 DIAGNOSIS — I4892 Unspecified atrial flutter: Secondary | ICD-10-CM | POA: Diagnosis not present

## 2014-10-01 DIAGNOSIS — Z5181 Encounter for therapeutic drug level monitoring: Secondary | ICD-10-CM | POA: Diagnosis not present

## 2014-10-01 LAB — POCT INR: INR: 3

## 2014-10-10 ENCOUNTER — Ambulatory Visit (INDEPENDENT_AMBULATORY_CARE_PROVIDER_SITE_OTHER): Payer: Medicare Other | Admitting: Family Medicine

## 2014-10-10 ENCOUNTER — Telehealth: Payer: Self-pay | Admitting: Internal Medicine

## 2014-10-10 DIAGNOSIS — E349 Endocrine disorder, unspecified: Secondary | ICD-10-CM

## 2014-10-10 DIAGNOSIS — E291 Testicular hypofunction: Secondary | ICD-10-CM | POA: Diagnosis not present

## 2014-10-10 MED ORDER — TESTOSTERONE CYPIONATE 100 MG/ML IM SOLN
200.0000 mg | Freq: Once | INTRAMUSCULAR | Status: AC
  Administered 2014-10-10: 200 mg via INTRAMUSCULAR

## 2014-10-10 NOTE — Telephone Encounter (Signed)
Called patient again and the phone rang once and I was sent to voicemail.  Left a message on the voicemail stating that the Vita Pulse medication is safe to take with Coumadin/ Warfarin as advised by Sally-Pharmacist and to call back with any questions.

## 2014-10-10 NOTE — Telephone Encounter (Signed)
Returned called to the patient in reference to taking Vita Pulse; there was no answer and no voicemail set up.

## 2014-10-10 NOTE — Telephone Encounter (Signed)
Routing to Coumadin clinic to address

## 2014-10-10 NOTE — Telephone Encounter (Signed)
New message     Patient calling wants to discus taken a new  medication - Vita Pulse will it conflict with coumadin.

## 2014-10-24 ENCOUNTER — Ambulatory Visit (INDEPENDENT_AMBULATORY_CARE_PROVIDER_SITE_OTHER): Payer: Medicare Other | Admitting: Family Medicine

## 2014-10-24 DIAGNOSIS — E291 Testicular hypofunction: Secondary | ICD-10-CM | POA: Diagnosis not present

## 2014-10-24 MED ORDER — TESTOSTERONE CYPIONATE 200 MG/ML IM SOLN
200.0000 mg | Freq: Once | INTRAMUSCULAR | Status: AC
  Administered 2014-10-24: 200 mg via INTRAMUSCULAR

## 2014-10-28 ENCOUNTER — Other Ambulatory Visit: Payer: Self-pay

## 2014-10-29 ENCOUNTER — Ambulatory Visit (INDEPENDENT_AMBULATORY_CARE_PROVIDER_SITE_OTHER): Payer: Medicare Other

## 2014-10-29 DIAGNOSIS — I4892 Unspecified atrial flutter: Secondary | ICD-10-CM

## 2014-10-29 DIAGNOSIS — Z5181 Encounter for therapeutic drug level monitoring: Secondary | ICD-10-CM | POA: Diagnosis not present

## 2014-10-29 LAB — POCT INR: INR: 2.9

## 2014-11-01 ENCOUNTER — Other Ambulatory Visit: Payer: Self-pay | Admitting: *Deleted

## 2014-11-01 MED ORDER — ENALAPRIL MALEATE 10 MG PO TABS: 10.0000 mg | ORAL_TABLET | Freq: Two times a day (BID) | ORAL | Status: DC

## 2014-11-07 ENCOUNTER — Ambulatory Visit (INDEPENDENT_AMBULATORY_CARE_PROVIDER_SITE_OTHER): Payer: Medicare Other | Admitting: Family Medicine

## 2014-11-07 DIAGNOSIS — E291 Testicular hypofunction: Secondary | ICD-10-CM

## 2014-11-07 MED ORDER — TESTOSTERONE CYPIONATE 200 MG/ML IM SOLN
300.0000 mg | Freq: Once | INTRAMUSCULAR | Status: AC
  Administered 2014-11-07: 300 mg via INTRAMUSCULAR

## 2014-11-11 ENCOUNTER — Telehealth: Payer: Self-pay

## 2014-11-11 MED ORDER — TESTOSTERONE CYPIONATE 200 MG/ML IM SOLN: 300.0000 mg | INTRAMUSCULAR | Status: DC

## 2014-11-11 NOTE — Telephone Encounter (Signed)
Last visit 07/24/14 Last refill 08/15/14 10 ml 0 refill  Testosterone

## 2014-11-11 NOTE — Addendum Note (Signed)
Addended by: Marcina Millard on: 11/11/2014 02:42 PM   Modules accepted: Orders

## 2014-11-11 NOTE — Telephone Encounter (Signed)
Faxed Rx to pharmacy  

## 2014-11-11 NOTE — Telephone Encounter (Signed)
Refill okay?  

## 2014-11-18 ENCOUNTER — Encounter: Payer: Self-pay | Admitting: *Deleted

## 2014-11-18 ENCOUNTER — Telehealth: Payer: Self-pay | Admitting: Internal Medicine

## 2014-11-18 ENCOUNTER — Ambulatory Visit (INDEPENDENT_AMBULATORY_CARE_PROVIDER_SITE_OTHER): Payer: Medicare Other | Admitting: Physician Assistant

## 2014-11-18 ENCOUNTER — Encounter: Payer: Self-pay | Admitting: Physician Assistant

## 2014-11-18 ENCOUNTER — Ambulatory Visit (INDEPENDENT_AMBULATORY_CARE_PROVIDER_SITE_OTHER): Payer: Medicare Other | Admitting: Pharmacist

## 2014-11-18 VITALS — BP 144/110 | HR 122 | Ht 69.0 in | Wt 168.2 lb

## 2014-11-18 DIAGNOSIS — I484 Atypical atrial flutter: Secondary | ICD-10-CM

## 2014-11-18 DIAGNOSIS — R0789 Other chest pain: Secondary | ICD-10-CM | POA: Diagnosis not present

## 2014-11-18 DIAGNOSIS — Z5181 Encounter for therapeutic drug level monitoring: Secondary | ICD-10-CM

## 2014-11-18 DIAGNOSIS — I10 Essential (primary) hypertension: Secondary | ICD-10-CM

## 2014-11-18 DIAGNOSIS — I4892 Unspecified atrial flutter: Secondary | ICD-10-CM

## 2014-11-18 DIAGNOSIS — E785 Hyperlipidemia, unspecified: Secondary | ICD-10-CM

## 2014-11-18 LAB — POCT INR: INR: 2.8

## 2014-11-18 MED ORDER — METOPROLOL SUCCINATE ER 25 MG PO TB24: 25.0000 mg | ORAL_TABLET | Freq: Every day | ORAL | Status: DC

## 2014-11-18 NOTE — Telephone Encounter (Signed)
Chest pain for past 3/4 days, comes and goes, experiences at rest and with activity, pain level is 5/6. Denies radiating pain, SOB, syncope, light-headedness, edema or any other symptoms. Denies any recent strenuous activity. Reviewed with [name], flex PA, who will see patient this afternoon at 3:30 pm. Patient verbalized understanding and agreeable to plan.

## 2014-11-18 NOTE — Telephone Encounter (Signed)
New message     Pt is having chest pain  Pt c/o of Chest Pain: STAT if CP now or developed within 24 hours  1. Are you having CP right now? No; pain happens when pt changes position and pain is internal  2. Are you experiencing any other symptoms (ex. SOB, nausea, vomiting, sweating)? SOB when more active   3. How long have you been experiencing CP? 3 or 4 days  4. Is your CP continuous or coming and going? Comes and goes and depending on position and other factors  5. Have you taken Nitroglycerin? No  Please call to discuss ?

## 2014-11-18 NOTE — Progress Notes (Signed)
Cardiology Office Note   Date:  11/18/2014   ID:  [name], DOB 05-18-1925, MRN [id number]  PCP:  [name], MD  Cardiologist:  Dr. Virl Axe     Chief Complaint  Patient presents with  . Chest Pain     History of Present Illness: [name] is a 81 y.o. male with a hx of atrial fibrillation and atypical atrial flutter.  He is s/p PVI ablation at the [facility].  He developed AFlutter with RVR in 2015 and EF was 20%.  This was thought to be tachycardia mediated.  He was placed on Amiodarone and underwent TEE-DCCV with restoration of NSR.  His Amiodarone was then stopped.  His EF returned to normal by echo in 08/2013.  Other hx includes HL, hypothyroidism, HTN, PMR.    He called in today with complaints of chest pain over the past several days and was added on to my schedule for further evaluation.  He is here alone.  He started having chest pain with changes in positioning 4 days ago. This is located in his L lower chest.  He denies symptoms with exertion.  He does push ups and sit ups every day.  This type of positional change makes it worse.  He also has DOE.  He has noted that he is having decreased exercise tolerance.  No orthopnea, PND, edema.  No syncope.  He tried to push mow his yard the other day and he was dizzy with this.  Denies pleuritic chest pain.     Studies/Reports Reviewed Today:  Echo 09/04/13 - Left ventricle: EF 55% to 60%. Wallmotion was normal; there were no regional wall motion abnormalities. Left ventricular diastolic functionparameters were normal. - Aortic valve: Trivial regurgitation. - Mitral valve: Mild regurgitation. - Left atrium: The atrium was mildly dilated.    Past Medical History  Diagnosis Date  . AF (atrial fibrillation)     a. s/p RFCA/PVI @ [facility] clinic 2007.  Marland Kitchen Neoplasm of uncertain behavior     SKIN  . Hyperlipidemia   . Hypothyroidism   . HYPERTENSION, BENIGN 08/21/2009    Qualifier: Diagnosis of  By:  Caryl Comes, MD, Remus Blake  Medications(s)  enalapril   . Polymyalgia rheumatica 01/13/2007    Qualifier: History of  By: Danny Lawless CMA, [place]     . Arthritis     "joints" (06/21/2013)  . Atrial flutter     a. 06/2013 s/p TEE/DCCV  . Chronic systolic CHF (congestive heart failure)     a. 06/2013 Echo: EF 20%, diff HK, mild LVH.    Past Surgical History  Procedure Laterality Date  . Hand surgery Right     "broke bone; grafted area w/bone from somewhere else"  . Cataract extraction, bilateral Bilateral Summer 2009  . Tonsillectomy    . Cholecystectomy    . Inguinal hernia repair Bilateral   . Atrial fibrillation ablation  2007  . Hemorrhoid surgery    . Tee without cardioversion N/A 06/22/2013    Procedure: TRANSESOPHAGEAL ECHOCARDIOGRAM (TEE);  Surgeon: Sueanne Margarita, MD;  Location: [place];  Service: Cardiovascular;  Laterality: N/A;  . Cardioversion N/A 06/22/2013    Procedure: CARDIOVERSION;  Surgeon: Sueanne Margarita, MD;  [facility];  Service: Cardiovascular;  Laterality: N/A;  15:17 Lido 39m,IV , Propofol 30 mg, IV synched cardioversion @ 150 joules by Dr. TLajoyce Lauberfrom a flutter to sinis brady which is baseline for this pt, reports 45-50 reg HR verified by Dr.  Ashok Norris     Current Outpatient Prescriptions  Medication Sig Dispense Refill  . atorvastatin (LIPITOR) 10 MG tablet Take 1 tablet (10 mg total) by mouth daily. 90 tablet 3  . carboxymethylcellulose (REFRESH TEARS) 0.5 % SOLN Place 1 drop into both eyes 3 (three) times daily as needed (dry eyes).     . cholecalciferol (VITAMIN D) 1000 UNITS tablet Take 1,000 Units by mouth every morning.    . enalapril (VASOTEC) 10 MG tablet Take 1 tablet (10 mg total) by mouth 2 (two) times daily. 60 tablet 10  . Glucosamine-Chondroitin 500-400 MG CAPS Take 1 capsule by mouth every morning.    . hydrochlorothiazide (HYDRODIURIL) 25 MG tablet Take 1 tablet (25 mg total) by mouth daily. 90 tablet 1  . levothyroxine  (SYNTHROID) 50 MCG tablet Take 1 tablet (50 mcg total) by mouth daily. 90 tablet 3  . Multiple Vitamins-Minerals (CENTRUM SILVER PO) Take 1 tablet by mouth every morning.     . sildenafil (REVATIO) 20 MG tablet Take 40-100 mg by mouth daily as needed (erectile dusfunction).    Marland Kitchen testosterone cypionate (DEPOTESTOSTERONE CYPIONATE) 200 MG/ML injection Inject 1.5 mLs into the muscle every 14 (fourteen) days.  0  . warfarin (COUMADIN) 2 MG tablet Take as directed by anticoagulation clinic 180 tablet 0  . metoprolol succinate (TOPROL-XL) 25 MG 24 hr tablet Take 1 tablet (25 mg total) by mouth daily. 30 tablet 0   No current facility-administered medications for this visit.    Allergies:   Review of patient's allergies indicates no known allergies.    Social History:  The patient  reports that he quit smoking about 51 years ago. His smoking use included Cigarettes. He has a 12 pack-year smoking history. He has never used smokeless tobacco. He reports that he drinks alcohol. He reports that he does not use illicit drugs.   Family History:  The patient's family history includes Diabetes in his mother; Emphysema in his mother; Kidney disease in his father.    ROS:   Please see the history of present illness.   Review of Systems  Cardiovascular: Positive for chest pain.  Musculoskeletal: Positive for back pain.  All other systems reviewed and are negative.     PHYSICAL EXAM: VS:  BP 144/110 mmHg  Pulse 122  Ht 5' 9" (1.753 m)  Wt 168 lb 3.2 oz (76.295 kg)  BMI 24.83 kg/m2    Wt Readings from Last 3 Encounters:  11/18/14 168 lb 3.2 oz (76.295 kg)  09/20/14 169 lb 12.8 oz (77.021 kg)  07/24/14 166 lb (75.297 kg)     GEN: Well nourished, well developed, in no acute distress HEENT: normal Neck: no JVD,   no masses Cardiac:  Normal S1/S2, rapid regular rhythm; no murmur, no rubs or gallops, no edema   Respiratory:  clear to auscultation bilaterally, no wheezing, rhonchi or rales. GI:  soft, nontender, nondistended, + BS MS: no deformity or atrophy Skin: warm and dry  Neuro:  CNs II-XII intact, Strength and sensation are intact Psych: Normal affect   EKG:  EKG is ordered today.  It demonstrates:   Probable AFlutter, HR 122   Recent Labs: 01/23/2014: ALT 9; BUN 29*; Creatinine, Ser 1.6*; Hemoglobin 15.2; Platelets 164.0; Potassium 5.3*; Sodium 137; TSH 0.67    Lipid Panel    Component Value Date/Time   CHOL 132 01/23/2014 1443   TRIG 168.0* 01/23/2014 1443   HDL 33.50* 01/23/2014 1443   CHOLHDL 4 01/23/2014 1443  VLDL 33.6 01/23/2014 1443   LDLCALC 65 01/23/2014 1443      ASSESSMENT AND PLAN:  Atypical atrial flutter with RVR:  He is back in AFlutter with RVR.  I think his symptoms are all related to his AFlutter.  He has had a therapeutic INR for the past 2 mos.  Repeat INR today is 2.8.  He has been on the same dose of Coumadin for several mos now. I will arrange a DCCV.  I discussed this with Dr. Dorris Carnes (DOD) who agreed.  He had junctional bradycardia last year and was taken off of Amiodarone and, eventually, his Toprol was stopped due to bradycardia.  I will place him on low dose Toprol-XL 25 mg QD for rate control. He will stop this the day of his DCCV.  He will need FU with Dr. Virl Axe to decide on further rhythm control post DCCV.  Check BMET, CBC, TSH. We cannot schedule his cardioversion for 2 days. His INR be repeated the day of his cardioversion to ensure it is > 2.  Essential hypertension:  Uncontrolled.  Add Toprol as noted.  Continue to monitor.   Hyperlipidemia:  Continue statin.  Chest Pain:  Overall sounds MSK.  May be related to AFlutter.  Proceed with DCCV as planned.  If pain continues after return of NSR, consider stress testing.       Medication Changes: Current medicines are reviewed at length with the patient today.  Concerns regarding medicines are as outlined above.  The following changes have been made:   Discontinued  Medications   TESTOSTERONE CYPIONATE (DEPOTESTOSTERONE CYPIONATE) 200 MG/ML INJECTION    Inject 1.5 mLs (300 mg total) into the muscle every 28 (twenty-eight) days.   TESTOSTERONE CYPIONATE 200 MG/ML KIT    Inject 1.5 mLs into the muscle every 14 (fourteen) days.   Modified Medications   No medications on file   New Prescriptions   METOPROLOL SUCCINATE (TOPROL-XL) 25 MG 24 HR TABLET    Take 1 tablet (25 mg total) by mouth daily.     Labs/ tests ordered today include:   Orders Placed This Encounter  Procedures  . Basic Metabolic Panel (BMET)  . CBC w/Diff  . TSH  . EKG 12-Lead     Disposition:   FU with Dr. Virl Axe 2 weeks.    Signed, Versie Starks, MHS 11/18/2014 4:51 PM    [facility] [place], [place], [place]  [address] Phone: [phone]; Fax: [phone]

## 2014-11-18 NOTE — Patient Instructions (Addendum)
Medication Instructions:  1. START TOPROL XL 25 MG 1 TAB ; TAKE 1 TAB TONIGHT 7/18 AND TOMORROW NIGHT 7/19; RX SENT IN  Labwork: 1. TODAY BMET, CBC W/DIFF, TSH  Testing/Procedures: Your physician has recommended that you have a Cardioversion (DCCV). Electrical Cardioversion uses a jolt of electricity to your heart either through paddles or wired patches attached to your chest. This is a controlled, usually prescheduled, procedure. Defibrillation is done under light anesthesia in the hospital, and you usually go home the day of the procedure. This is done to get your heart back into a normal rhythm. You are not awake for the procedure. Please see the instruction sheet given to you today.   Follow-Up: 2 WEEK FOLLOW UP WITH DR. Caryl Comes POST CARDIOVERSION  Any Other Special Instructions Will Be Listed Below (If Applicable).

## 2014-11-19 LAB — CBC WITH DIFFERENTIAL/PLATELET
Basophils Absolute: 0 10*3/uL (ref 0.0–0.1)
Basophils Relative: 0.3 % (ref 0.0–3.0)
EOS ABS: 0.2 10*3/uL (ref 0.0–0.7)
EOS PCT: 2.3 % (ref 0.0–5.0)
HCT: 53.4 % — ABNORMAL HIGH (ref 39.0–52.0)
Hemoglobin: 17.7 g/dL — ABNORMAL HIGH (ref 13.0–17.0)
LYMPHS PCT: 14 % (ref 12.0–46.0)
Lymphs Abs: 0.9 10*3/uL (ref 0.7–4.0)
MCHC: 33.1 g/dL (ref 30.0–36.0)
MCV: 91.3 fl (ref 78.0–100.0)
Monocytes Absolute: 0.8 10*3/uL (ref 0.1–1.0)
Monocytes Relative: 11.6 % (ref 3.0–12.0)
NEUTROS PCT: 71.8 % (ref 43.0–77.0)
Neutro Abs: 4.7 10*3/uL (ref 1.4–7.7)
PLATELETS: 173 10*3/uL (ref 150.0–400.0)
RBC: 5.85 Mil/uL — AB (ref 4.22–5.81)
RDW: 13.6 % (ref 11.5–15.5)
WBC: 6.6 10*3/uL (ref 4.0–10.5)

## 2014-11-19 LAB — BASIC METABOLIC PANEL
BUN: 34 mg/dL — ABNORMAL HIGH (ref 6–23)
CO2: 27 meq/L (ref 19–32)
Calcium: 9.3 mg/dL (ref 8.4–10.5)
Chloride: 101 mEq/L (ref 96–112)
Creatinine, Ser: 1.8 mg/dL — ABNORMAL HIGH (ref 0.40–1.50)
GFR: 38.14 mL/min — AB (ref >60.00)
Glucose, Bld: 83 mg/dL (ref 70–99)
Potassium: 5 mEq/L (ref 3.5–5.1)
SODIUM: 136 meq/L (ref 135–145)

## 2014-11-19 LAB — TSH: TSH: 2.93 u[IU]/mL (ref 0.35–4.50)

## 2014-11-20 ENCOUNTER — Ambulatory Visit (INDEPENDENT_AMBULATORY_CARE_PROVIDER_SITE_OTHER): Payer: Medicare Other | Admitting: Family Medicine

## 2014-11-20 DIAGNOSIS — E291 Testicular hypofunction: Secondary | ICD-10-CM | POA: Diagnosis not present

## 2014-11-20 MED ORDER — TESTOSTERONE CYPIONATE 200 MG/ML IM SOLN
300.0000 mg | Freq: Once | INTRAMUSCULAR | Status: AC
  Administered 2014-11-20: 300 mg via INTRAMUSCULAR

## 2014-11-21 ENCOUNTER — Ambulatory Visit (HOSPITAL_COMMUNITY): Payer: Medicare Other | Admitting: Anesthesiology

## 2014-11-21 ENCOUNTER — Ambulatory Visit: Payer: Medicare Other | Admitting: Family Medicine

## 2014-11-21 ENCOUNTER — Encounter (HOSPITAL_COMMUNITY): Payer: Self-pay | Admitting: *Deleted

## 2014-11-21 ENCOUNTER — Ambulatory Visit (HOSPITAL_COMMUNITY)
Admission: RE | Admit: 2014-11-21 | Discharge: 2014-11-21 | Disposition: A | Discharge status: Home / Self Care | Payer: Medicare Other | Source: Ambulatory Visit | Attending: Internal Medicine | Admitting: Internal Medicine

## 2014-11-21 ENCOUNTER — Ambulatory Visit (INDEPENDENT_AMBULATORY_CARE_PROVIDER_SITE_OTHER): Payer: Medicare Other

## 2014-11-21 ENCOUNTER — Encounter (HOSPITAL_COMMUNITY)
Admission: RE | Disposition: A | Discharge status: Home / Self Care | Payer: Self-pay | Source: Ambulatory Visit | Attending: Internal Medicine

## 2014-11-21 ENCOUNTER — Telehealth: Payer: Self-pay | Admitting: *Deleted

## 2014-11-21 DIAGNOSIS — Z87891 Personal history of nicotine dependence: Secondary | ICD-10-CM | POA: Insufficient documentation

## 2014-11-21 DIAGNOSIS — M1990 Unspecified osteoarthritis, unspecified site: Secondary | ICD-10-CM | POA: Insufficient documentation

## 2014-11-21 DIAGNOSIS — I4891 Unspecified atrial fibrillation: Secondary | ICD-10-CM | POA: Diagnosis not present

## 2014-11-21 DIAGNOSIS — I4892 Unspecified atrial flutter: Secondary | ICD-10-CM | POA: Insufficient documentation

## 2014-11-21 DIAGNOSIS — I5022 Chronic systolic (congestive) heart failure: Secondary | ICD-10-CM | POA: Diagnosis not present

## 2014-11-21 DIAGNOSIS — M353 Polymyalgia rheumatica: Secondary | ICD-10-CM | POA: Insufficient documentation

## 2014-11-21 DIAGNOSIS — E039 Hypothyroidism, unspecified: Secondary | ICD-10-CM | POA: Insufficient documentation

## 2014-11-21 DIAGNOSIS — E785 Hyperlipidemia, unspecified: Secondary | ICD-10-CM | POA: Diagnosis not present

## 2014-11-21 DIAGNOSIS — I10 Essential (primary) hypertension: Secondary | ICD-10-CM | POA: Diagnosis not present

## 2014-11-21 DIAGNOSIS — Z5181 Encounter for therapeutic drug level monitoring: Secondary | ICD-10-CM | POA: Diagnosis not present

## 2014-11-21 DIAGNOSIS — I484 Atypical atrial flutter: Secondary | ICD-10-CM | POA: Insufficient documentation

## 2014-11-21 HISTORY — PX: CARDIOVERSION: SHX1299

## 2014-11-21 LAB — POCT INR: INR: 2.4

## 2014-11-21 SURGERY — CARDIOVERSION
Anesthesia: Monitor Anesthesia Care

## 2014-11-21 MED ORDER — SODIUM CHLORIDE 0.9 % IV SOLN
250.0000 mL | INTRAVENOUS | Status: DC
  Administered 2014-11-21: 250 mL via INTRAVENOUS

## 2014-11-21 MED ORDER — SODIUM CHLORIDE 0.9 % IV SOLN: 250.0000 mL | INTRAVENOUS | Status: DC

## 2014-11-21 MED ORDER — SODIUM CHLORIDE 0.9 % IJ SOLN: 3.0000 mL | INTRAMUSCULAR | Status: DC | PRN

## 2014-11-21 MED ORDER — SODIUM CHLORIDE 0.9 % IJ SOLN: 3.0000 mL | Freq: Two times a day (BID) | INTRAMUSCULAR | Status: DC

## 2014-11-21 MED ORDER — SODIUM CHLORIDE 0.9 % IV SOLN: INTRAVENOUS | Status: DC | PRN

## 2014-11-21 MED ORDER — SODIUM CHLORIDE 0.9 % IV SOLN
INTRAVENOUS | Status: DC | PRN
  Administered 2014-11-21: 14:00:00 via INTRAVENOUS

## 2014-11-21 NOTE — Anesthesia Preprocedure Evaluation (Addendum)
Anesthesia Evaluation  Patient identified by MRN, date of birth, ID band Patient awake    Reviewed: Allergy & Precautions, H&P , NPO status , Patient's Chart, lab work & pertinent test results  Airway Mallampati: I  TM Distance: >3 FB Neck ROM: full    Dental  (+) Teeth Intact   Pulmonary former smoker,    Pulmonary exam normal       Cardiovascular hypertension, Pt. on medications +CHF Normal cardiovascular exam    Neuro/Psych    GI/Hepatic   Endo/Other  Hypothyroidism   Renal/GU      Musculoskeletal   Abdominal   Peds  Hematology   Anesthesia Other Findings   Reproductive/Obstetrics                            Anesthesia Physical Anesthesia Plan  ASA: III  Anesthesia Plan: General   Post-op Pain Management:    Induction: Intravenous  Airway Management Planned: Mask  Additional Equipment:   Intra-op Plan:   Post-operative Plan:   Informed Consent: I have reviewed the patients History and Physical, chart, labs and discussed the procedure including the risks, benefits and alternatives for the proposed anesthesia with the patient or authorized representative who has indicated his/her understanding and acceptance.   Dental Advisory Given  Plan Discussed with: Surgeon and CRNA  Anesthesia Plan Comments:        Anesthesia Quick Evaluation

## 2014-11-21 NOTE — Anesthesia Postprocedure Evaluation (Signed)
Anesthesia Post Note  Patient: [name]  Procedure(s) Performed: Procedure(s) (LRB): CARDIOVERSION (N/A)  Anesthesia type: general  Patient location: PACU  Post pain: Pain level controlled  Post assessment: Patient's Cardiovascular Status Stable  Last Vitals:  Filed Vitals:   11/21/14 1410  BP:   Pulse: 96  Temp: 36.9 C  Resp: 13    Post vital signs: Reviewed and stable  Level of consciousness: sedated  Complications: No apparent anesthesia complications

## 2014-11-21 NOTE — Telephone Encounter (Signed)
Pt notified of lab results while in the office today having his INR check for DCCV today at 2 pm.

## 2014-11-21 NOTE — H&P (Signed)
Cardiology Office Note   Date:  11/21/2014   ID:  Mccoy Gabriel, DOB 1926/02/08, MRN [id number]  PCP:  [name], MD  Cardiologist:  Toney Rakes chief complaint on file.     History of Present Illness: [name] is a 81 y.o. male with a history of  [name] is a 81 y.o. male with a hx of atrial fibrillation and atypical atrial flutter. He is s/p PVI ablation at the [facility]. He developed AFlutter with RVR in 2015 and EF was 20%. This was thought to be tachycardia mediated. He was placed on Amiodarone and underwent TEE-DCCV with restoration of NSR. His Amiodarone was then stopped. His EF returned to normal by echo in 08/2013. Other hx includes HL, hypothyroidism, HTN, PMR.   Pt seen in clinic on Monday  Found to be back in atrial flutter  INRwas therapeutic.  Plan for cardioverson      Current Facility-Administered Medications  Medication Dose Route Frequency Provider Last Rate Last Dose  . 0.9 %  sodium chloride infusion  250 mL Intravenous Continuous Fay Records, MD 50 mL/hr at 11/21/14 1249 250 mL at 11/21/14 1249    Allergies:   Review of patient's allergies indicates no known allergies.   Past Medical History  Diagnosis Date  . AF (atrial fibrillation)     a. s/p RFCA/PVI @ [facility] clinic 2007.  Marland Kitchen Neoplasm of uncertain behavior     SKIN  . Hyperlipidemia   . Hypothyroidism   . HYPERTENSION, BENIGN 08/21/2009    Qualifier: Diagnosis of  By: Caryl Comes, MD, Remus Blake  Medications(s)  enalapril   . Polymyalgia rheumatica 01/13/2007    Qualifier: History of  By: Danny Lawless CMA, [place]     . Arthritis     "joints" (06/21/2013)  . Atrial flutter     a. 06/2013 s/p TEE/DCCV  . Chronic systolic CHF (congestive heart failure)     a. 06/2013 Echo: EF 20%, diff HK, mild LVH.    Past Surgical History  Procedure Laterality Date  . Hand surgery Right     "broke bone; grafted area w/bone from somewhere else"  . Cataract extraction,  bilateral Bilateral Summer 2009  . Tonsillectomy    . Cholecystectomy    . Inguinal hernia repair Bilateral   . Atrial fibrillation ablation  2007  . Hemorrhoid surgery    . Tee without cardioversion N/A 06/22/2013    Procedure: TRANSESOPHAGEAL ECHOCARDIOGRAM (TEE);  Surgeon: Sueanne Margarita, MD;  Location: [place];  Service: Cardiovascular;  Laterality: N/A;  . Cardioversion N/A 06/22/2013    Procedure: CARDIOVERSION;  Surgeon: Sueanne Margarita, MD;  Location: [facility];  Service: Cardiovascular;  Laterality: N/A;  15:17 Lido 20mg ,IV , Propofol 30 mg, IV synched cardioversion @ 150 joules by Dr. Lajoyce Lauber from a flutter to sinis brady which is baseline for this pt, reports 45-50 reg HR verified by Dr. Ashok Norris     Social History:  The patient  reports that he quit smoking about 51 years ago. His smoking use included Cigarettes. He has a 12 pack-year smoking history. He has never used smokeless tobacco. He reports that he drinks alcohol. He reports that he does not use illicit drugs.   Family History:  The patient's family history includes Diabetes in his mother; Emphysema in his mother; Kidney disease in his father.    ROS:  Please see the history of present illness. All other systems are reviewed and  Negative to the above problem except as noted.    PHYSICAL EXAM: VS:  BP 149/110 mmHg  Pulse 121  Temp(Src) 97.5 F (36.4 C) (Oral)  Resp 18  GEN: Well nourished, well developed, in no acute distress HEENT: normal Neck: no JVD, carotid bruits, or masses Cardiac: RRR; no murmurs, rubs, or gallops,no edema  Respiratory:  clear to auscultation bilaterally, normal work of breathing GI: soft, nontender, nondistended, + BS  No hepatomegaly  MS: no deformity Moving all extremities   Skin: warm and dry, no rash Neuro:  Strength and sensation are intact Psych: euthymic mood, full affect   EKG:  EKG is ordered today.   Lipid Panel    Component Value Date/Time   CHOL 132 01/23/2014  1443   TRIG 168.0* 01/23/2014 1443   HDL 33.50* 01/23/2014 1443   CHOLHDL 4 01/23/2014 1443   VLDL 33.6 01/23/2014 1443   LDLCALC 65 01/23/2014 1443      Wt Readings from Last 3 Encounters:  11/18/14 168 lb 3.2 oz (76.295 kg)  09/20/14 169 lb 12.8 oz (77.021 kg)  07/24/14 166 lb (75.297 kg)      ASSESSMENT AND PLAN:  81 yo with recurrent atrial flutter  presnts for cardioverson       Signed, Dorris Carnes, MD  11/21/2014 12:54 PM    [facility] [place], [place], [place]  [address] Phone: [phone]; Fax: [phone]

## 2014-11-21 NOTE — Discharge Instructions (Signed)

## 2014-11-21 NOTE — Anesthesia Procedure Notes (Signed)
Procedure Name: MAC Date/Time: 11/21/2014 1:41 PM Performed by: Neldon Newport

## 2014-11-21 NOTE — Transfer of Care (Signed)
Immediate Anesthesia Transfer of Care Note  Patient: [name]  Procedure(s) Performed: Procedure(s): CARDIOVERSION (N/A)  Patient Location: Endoscopy Unit  Anesthesia Type:MAC  Level of Consciousness: awake, alert  and oriented  Airway & Oxygen Therapy: Patient Spontanous Breathing and Patient connected to nasal cannula oxygen  Post-op Assessment: Report given to RN, Post -op Vital signs reviewed and stable and Patient moving all extremities X 4  Post vital signs: Reviewed and stable  Last Vitals:  Filed Vitals:   11/21/14 1245  BP: 149/110  Pulse: 121  Temp: 36.4 C  Resp: 18    Complications: No apparent anesthesia complications

## 2014-11-22 ENCOUNTER — Encounter (HOSPITAL_COMMUNITY): Payer: Self-pay | Admitting: Internal Medicine

## 2014-11-25 ENCOUNTER — Telehealth: Payer: Self-pay | Admitting: Family Medicine

## 2014-11-25 NOTE — Telephone Encounter (Signed)
Patient Name: [name]  DOB: [birth year]    Initial Comment Caller states has pain in chest, along w/ heart fluttering for about 10 days, had EKG last week, had cardio version Thursday but still having pain.    Nurse Assessment  Nurse: Verlin Fester RN, Stanton Kidney Date/Time (Eastern Time): 11/25/2014 9:47:05 AM  Confirm and document reason for call. If symptomatic, describe symptoms. ---Patient states he had a cardio version last Thursday and he is still having chest pain.  Has the patient traveled out of the country within the last 30 days? ---No  Does the patient require triage? ---Yes  Related visit to physician within the last 2 weeks? ---Yes  Does the PT have any chronic conditions? (i.e. diabetes, asthma, etc.) ---Yes  List chronic conditions. ---"back pain, HTN, Atrial flutter,     Guidelines    Guideline Title Affirmed Question Affirmed Notes  Chest Pain Chest pain lasts > 5 minutes (Exceptions: chest pain occurring > 3 days ago and now asymptomatic; same as previously diagnosed heartburn and has accompanying sour taste in mouth)    Final Disposition User   Go to ED Now (or PCP triage) Verlin Fester, RN, [name] states he is not having the pain right now and it is clearly made worse by movement

## 2014-11-25 NOTE — Telephone Encounter (Signed)
Appointment scheduled for tomorrow at 11am.

## 2014-11-26 ENCOUNTER — Encounter: Payer: Self-pay | Admitting: Family Medicine

## 2014-11-26 ENCOUNTER — Ambulatory Visit (INDEPENDENT_AMBULATORY_CARE_PROVIDER_SITE_OTHER): Payer: Medicare Other | Admitting: Family Medicine

## 2014-11-26 VITALS — BP 140/90 | HR 56 | Temp 98.0°F | Wt 169.0 lb

## 2014-11-26 DIAGNOSIS — R079 Chest pain, unspecified: Secondary | ICD-10-CM

## 2014-11-26 DIAGNOSIS — I484 Atypical atrial flutter: Secondary | ICD-10-CM | POA: Insufficient documentation

## 2014-11-26 NOTE — Progress Notes (Signed)
Subjective:    Patient ID: [name], male    DOB: November 28, 1925, 81 y.o.   MRN: [id number]  HPI Patient seen with onset about 10 days ago of almost nightly chest pain. He describes occasional pressure-like discomfort usually very transient 1-2 minutes left chest. No radiation to neck or left arm.  Symptoms occur day and night but appears to be more frequent at night. They seem to improve with position change such as lying on his back-but sometimes worse with position change such as pushups or sit-ups. He has noted slight decrease in exercise tolerance. No syncope.Marland Kitchen He does daily exercises with things like sit-ups and has not noted any related to exertion. No associated dyspnea. He has history of atrial flutter and atrial fibrillation and recent cardioversion which did not seem to have any impact on his symptoms. He had echocardiogram May 2015 which was normal.  He remains on coumadin.  No chest pain currently.   Past Medical History  Diagnosis Date  . AF (atrial fibrillation)     a. s/p RFCA/PVI @ [facility] clinic 2007.  Marland Kitchen Neoplasm of uncertain behavior     SKIN  . Hyperlipidemia   . Hypothyroidism   . HYPERTENSION, BENIGN 08/21/2009    Qualifier: Diagnosis of  By: Caryl Comes, MD, Remus Blake  Medications(s)  enalapril   . Polymyalgia rheumatica 01/13/2007    Qualifier: History of  By: Danny Lawless CMA, [place]     . Arthritis     "joints" (06/21/2013)  . Atrial flutter     a. 06/2013 s/p TEE/DCCV  . Chronic systolic CHF (congestive heart failure)     a. 06/2013 Echo: EF 20%, diff HK, mild LVH.   Past Surgical History  Procedure Laterality Date  . Hand surgery Right     "broke bone; grafted area w/bone from somewhere else"  . Cataract extraction, bilateral Bilateral Summer 2009  . Tonsillectomy    . Cholecystectomy    . Inguinal hernia repair Bilateral   . Atrial fibrillation ablation  2007  . Hemorrhoid surgery    . Tee without cardioversion N/A 06/22/2013    Procedure:  TRANSESOPHAGEAL ECHOCARDIOGRAM (TEE);  Surgeon: Sueanne Margarita, MD;  Location: [place];  Service: Cardiovascular;  Laterality: N/A;  . Cardioversion N/A 06/22/2013    Procedure: CARDIOVERSION;  Surgeon: Sueanne Margarita, MD;  [facility];  Service: Cardiovascular;  Laterality: N/A;  15:17 Lido 20mg ,IV , Propofol 30 mg, IV synched cardioversion @ 150 joules by Dr. Lajoyce Lauber from a flutter to sinis brady which is baseline for this pt, reports 45-50 reg HR verified by Dr. Ashok Norris  . Cardioversion N/A 11/21/2014    Procedure: CARDIOVERSION;  Surgeon: Fay Records, MD;  Location: [facility] ENDOSCOPY;  Service: Cardiovascular;  Laterality: N/A;    reports that he quit smoking about 51 years ago. His smoking use included Cigarettes. He has a 12 pack-year smoking history. He has never used smokeless tobacco. He reports that he drinks alcohol. He reports that he does not use illicit drugs. family history includes Diabetes in his mother; Emphysema in his mother; Kidney disease in his father. No Known Allergies    Review of Systems  Constitutional: Negative for appetite change and unexpected weight change.  Respiratory: Negative for cough.   Cardiovascular: Positive for chest pain. Negative for palpitations and leg swelling.  Neurological: Negative for syncope.  Psychiatric/Behavioral: Negative for confusion.       Objective:   Physical Exam  Constitutional: He is oriented to  person, place, and time. He appears well-developed and well-nourished.  HENT:  Right Ear: External ear normal.  Left Ear: External ear normal.  Mouth/Throat: Oropharynx is clear and moist.  Eyes: Pupils are equal, round, and reactive to light.  Neck: Neck supple. No JVD present. No thyromegaly present.  Cardiovascular: Normal rate and regular rhythm.   Pulmonary/Chest: Effort normal and breath sounds normal. No respiratory distress. He has no wheezes. He has no rales.  Musculoskeletal: He exhibits no edema.    Neurological: He is alert and oriented to person, place, and time.          Assessment & Plan:  Recent onset of somewhat atypical chest pain. Recurrent atrial flutter with recent cardioversion which did not seem to change his symptoms. EKG today shows mild sinus bradycardia with no acute changes. Will defer to cardiology regarding further evaluation of his ongoing transient chest pain. He has scheduled follow-up in August. We discussed avoidance of heavy exertional activities such as push mowing at this time until further evaluated

## 2014-11-26 NOTE — Progress Notes (Signed)
Patient anesthetized by anesthesia. With pads in AP position patient cardioverted to SR with 150 J synchronized biphasic energy Procedure without complications

## 2014-11-26 NOTE — Patient Instructions (Signed)

## 2014-11-26 NOTE — Progress Notes (Signed)
Pre visit review using our clinic review tool, if applicable. No additional management support is needed unless otherwise documented below in the visit note. 

## 2014-11-27 ENCOUNTER — Telehealth: Payer: Self-pay | Admitting: Physician Assistant

## 2014-11-27 DIAGNOSIS — R079 Chest pain, unspecified: Secondary | ICD-10-CM

## 2014-11-27 NOTE — Telephone Encounter (Signed)
Received message from [name], MD. Patient still having episodes of chest pain despite undergoing DCCV with restoration of NSR. Please call patient and arrange nuclear stress test. If he feels he can walk on a treadmill, do an ETT-Myoview. If he cannot then do a [employer]. He sees Dr. Virl Axe soon. Please try to get stress test done prior to his visit with Dr. Caryl Comes so he can review the results with the patient.  Richardson Dopp, PA-C   11/27/2014 4:06 PM

## 2014-11-27 NOTE — Telephone Encounter (Signed)
lmptcb re: message from Dr. Elease Hashimoto re: stress test..jkw 11-27-14

## 2014-11-27 NOTE — Telephone Encounter (Signed)
-----   Message from [name], MD sent at 11/26/2014  3:15 PM EDT ----- [name].  Still having some transient (generally 1-2 minute) episodes of  Somewhat atypical chest pain since his cardioversion.  He has appt to see Jolyn Nap in about 2 weeks.  [name]

## 2014-11-27 NOTE — Telephone Encounter (Signed)
I s/w pt and he is agreeable to having ETT myoview. This will be done on 8/3 before he sees Dr. Caryl Comes on 8/5. Verbal inmstructions gone over by phone w/pt though I will mail instructions out to pt.

## 2014-11-27 NOTE — Addendum Note (Signed)
Addended by: Michae Kava on: 11/27/2014 04:45 PM   Modules accepted: Orders

## 2014-11-28 ENCOUNTER — Ambulatory Visit (INDEPENDENT_AMBULATORY_CARE_PROVIDER_SITE_OTHER): Payer: Medicare Other | Admitting: *Deleted

## 2014-11-28 DIAGNOSIS — Z5181 Encounter for therapeutic drug level monitoring: Secondary | ICD-10-CM

## 2014-11-28 DIAGNOSIS — I4892 Unspecified atrial flutter: Secondary | ICD-10-CM

## 2014-11-28 LAB — POCT INR: INR: 2.9

## 2014-11-29 ENCOUNTER — Telehealth (HOSPITAL_COMMUNITY): Payer: Self-pay

## 2014-11-29 NOTE — Telephone Encounter (Signed)
Encounter complete. 

## 2014-12-03 ENCOUNTER — Telehealth: Payer: Self-pay | Admitting: *Deleted

## 2014-12-03 NOTE — Telephone Encounter (Signed)
Pt notified per Brynda Rim. PA ok to change to [facility], pt aware. I will Nuc Med know of this change in the order.

## 2014-12-04 ENCOUNTER — Ambulatory Visit (HOSPITAL_COMMUNITY)
Admission: RE | Admit: 2014-12-04 | Discharge: 2014-12-04 | Disposition: A | Discharge status: Home / Self Care | Payer: Medicare Other | Source: Ambulatory Visit | Attending: Cardiovascular Disease | Admitting: Cardiovascular Disease

## 2014-12-04 ENCOUNTER — Encounter: Payer: Self-pay | Admitting: Physician Assistant

## 2014-12-04 DIAGNOSIS — R079 Chest pain, unspecified: Secondary | ICD-10-CM | POA: Diagnosis not present

## 2014-12-04 DIAGNOSIS — R9439 Abnormal result of other cardiovascular function study: Secondary | ICD-10-CM | POA: Insufficient documentation

## 2014-12-04 LAB — MYOCARDIAL PERFUSION IMAGING
CHL CUP NUCLEAR SDS: 4
CHL CUP NUCLEAR SRS: 5
LV sys vol: 45 mL
LVDIAVOL: 110 mL
Peak HR: 61 {beats}/min
Rest HR: 45 {beats}/min
SSS: 9
TID: 0.96

## 2014-12-04 MED ORDER — TECHNETIUM TC 99M SESTAMIBI GENERIC - CARDIOLITE
10.2000 | Freq: Once | INTRAVENOUS | Status: AC | PRN
  Administered 2014-12-04: 10 via INTRAVENOUS

## 2014-12-04 MED ORDER — REGADENOSON 0.4 MG/5ML IV SOLN
0.4000 mg | Freq: Once | INTRAVENOUS | Status: AC
  Administered 2014-12-04: 0.4 mg via INTRAVENOUS

## 2014-12-04 MED ORDER — TECHNETIUM TC 99M SESTAMIBI GENERIC - CARDIOLITE
30.5000 | Freq: Once | INTRAVENOUS | Status: AC | PRN
  Administered 2014-12-04: 31 via INTRAVENOUS

## 2014-12-05 ENCOUNTER — Ambulatory Visit (INDEPENDENT_AMBULATORY_CARE_PROVIDER_SITE_OTHER): Payer: Medicare Other | Admitting: Family Medicine

## 2014-12-05 ENCOUNTER — Telehealth: Payer: Self-pay | Admitting: *Deleted

## 2014-12-05 DIAGNOSIS — E291 Testicular hypofunction: Secondary | ICD-10-CM | POA: Diagnosis not present

## 2014-12-05 MED ORDER — TESTOSTERONE CYPIONATE 200 MG/ML IM SOLN
300.0000 mg | Freq: Once | INTRAMUSCULAR | Status: AC
  Administered 2014-12-05: 300 mg via INTRAMUSCULAR

## 2014-12-05 NOTE — Telephone Encounter (Signed)
Pt notfiied of myoview results by phone with verbal understanding; pt advised to keep appt with Dr. Caryl Comes 8/5

## 2014-12-06 ENCOUNTER — Ambulatory Visit (INDEPENDENT_AMBULATORY_CARE_PROVIDER_SITE_OTHER): Payer: Medicare Other | Admitting: Internal Medicine

## 2014-12-06 ENCOUNTER — Encounter: Payer: Self-pay | Admitting: Internal Medicine

## 2014-12-06 VITALS — BP 146/70 | HR 46 | Ht 69.0 in | Wt 164.8 lb

## 2014-12-06 DIAGNOSIS — I484 Atypical atrial flutter: Secondary | ICD-10-CM

## 2014-12-06 NOTE — Patient Instructions (Signed)
Medication Instructions:  Your physician recommends that you continue on your current medications as directed. Please refer to the Current Medication list given to you today.  Labwork: None ordered  Testing/Procedures: None ordered  Follow-Up: Keep follow up in May 2017 with Dr. Caryl Comes  Any Other Special Instructions Will Be Listed Below (If Applicable). Thank you for choosing [facility]!!

## 2014-12-06 NOTE — Progress Notes (Signed)
Patient Care Team: [name], MD as PCP - General (Family Medicine) [name], MD (Ophthalmology) [name], MD (Orthopedic Surgery) [name], MD (Cardiology) [name], MD (General Surgery)   HPI  [name] is a 81 y.o. male Seen in followup of atypical atrial flutter Echo >>EF 20% <<50% Repeat echo 5/15 demonstrated normal left ventricular function  Prior PVI ([facility]-2007)>> Rx amiodarone+warfarin >>>TEEDCCV and amio dc as first recurrence  Has a history of bradycardia.  Significant stressors related to his daughter and her psychiatric hospitalizations  HR slow but always is  He was seen a few weeks ago in the clinic because of symptoms of chest pain. He underwent Myoview scanning   Nuclear stress EF: 59%. Low risk stress nuclear study with a fixed basal inferoseptal perfusion defect and normal left ventricular regional and global systolic function. No reversible ischemia. The inferoseptal defect most likely represents an attenuation artifact.  Past Medical History  Diagnosis Date  . AF (atrial fibrillation)     a. s/p RFCA/PVI @ [facility] 2007.  Marland Kitchen Neoplasm of uncertain behavior     SKIN  . Hyperlipidemia   . Hypothyroidism   . HYPERTENSION, BENIGN 08/21/2009    Qualifier: Diagnosis of  By: Caryl Comes, MD, Remus Blake  Medications(s)  enalapril   . Polymyalgia rheumatica 01/13/2007    Qualifier: History of  By: Danny Lawless CMA, [place]     . Arthritis     "joints" (06/21/2013)  . Atrial flutter     a. 06/2013 s/p TEE/DCCV  . Chronic systolic CHF (congestive heart failure)     a. 06/2013 Echo: EF 20%, diff HK, mild LVH.  Marland Kitchen History of cardiovascular stress test     Lexiscan Myoview 8/16:  EF 59%, attenuation artifact, no ischemia; Low Risk    Past Surgical History  Procedure Laterality Date  . Hand surgery Right     "broke bone; grafted area w/bone from somewhere else"  . Cataract extraction, bilateral  Bilateral Summer 2009  . Tonsillectomy    . Cholecystectomy    . Inguinal hernia repair Bilateral   . Atrial fibrillation ablation  2007  . Hemorrhoid surgery    . Tee without cardioversion N/A 06/22/2013    Procedure: TRANSESOPHAGEAL ECHOCARDIOGRAM (TEE);  Surgeon: Sueanne Margarita, MD;  Location: [place];  Service: Cardiovascular;  Laterality: N/A;  . Cardioversion N/A 06/22/2013    Procedure: CARDIOVERSION;  Surgeon: Sueanne Margarita, MD;  Location: [facility] ENDOSCOPY;  Service: Cardiovascular;  Laterality: N/A;  15:17 Lido 20mg ,IV , Propofol 30 mg, IV synched cardioversion @ 150 joules by Dr. Lajoyce Lauber from a flutter to sinis brady which is baseline for this pt, reports 45-50 reg HR verified by Dr. Ashok Norris  . Cardioversion N/A 11/21/2014    Procedure: CARDIOVERSION;  Surgeon: Fay Records, MD;  Location: [facility] ENDOSCOPY;  Service: Cardiovascular;  Laterality: N/A;    Current Outpatient Prescriptions  Medication Sig Dispense Refill  . atorvastatin (LIPITOR) 10 MG tablet Take 1 tablet (10 mg total) by mouth daily. 90 tablet 3  . carboxymethylcellulose (REFRESH TEARS) 0.5 % SOLN Place 1 drop into both eyes 3 (three) times daily as needed (dry eyes).     . cholecalciferol (VITAMIN D) 1000 UNITS tablet Take 1,000 Units by mouth every morning.    . enalapril (VASOTEC) 10 MG tablet Take 1 tablet (10 mg total) by mouth 2 (two) times daily. (Patient taking differently: Take 10 mg by mouth daily. )  60 tablet 10  . Glucosamine-Chondroitin 500-400 MG CAPS Take 1 capsule by mouth every morning.    . hydrochlorothiazide (HYDRODIURIL) 25 MG tablet Take 1 tablet (25 mg total) by mouth daily. 90 tablet 1  . levothyroxine (SYNTHROID) 50 MCG tablet Take 1 tablet (50 mcg total) by mouth daily. 90 tablet 3  . metoprolol succinate (TOPROL-XL) 25 MG 24 hr tablet Take 1 tablet (25 mg total) by mouth daily. (Patient taking differently: Take 12.5 mg by mouth daily. ) 30 tablet 0  . Multiple Vitamins-Minerals (CENTRUM SILVER  PO) Take 1 tablet by mouth every morning.     . sildenafil (REVATIO) 20 MG tablet Take 60 mg by mouth daily as needed (erectile dusfunction).     Marland Kitchen testosterone cypionate (DEPOTESTOSTERONE CYPIONATE) 200 MG/ML injection Inject 1.5 mLs into the muscle every 14 (fourteen) days.  0  . warfarin (COUMADIN) 2 MG tablet Take as directed by anticoagulation clinic (Patient taking differently: Take 4 mg by mouth. Take 4 mg by mouth 4 times weekly and take 3 mg by mouth 3 times weekly.) 180 tablet 0   No current facility-administered medications for this visit.    No Known Allergies  Review of Systems negative except from HPI and PMH  Physical Exam BP 146/70 mmHg  Pulse 46  Ht 5\' 9"  (1.753 m)  Wt 164 lb 12.8 oz (74.753 kg)  BMI 24.33 kg/m2 Well developed and well nourished in no acute distress HENT normal E scleral and icterus clear Neck Supple JVP flat; carotids brisk and full Clear to ausculation  *Regular rate and rhythm, no murmurs gallops or rub Soft with active bowel sounds; ecchymosis No clubbing cyanosis none Edema Alert and oriented, grossly normal motor and sensory function Skin Warm and Dry  ECG  Sinus rhythm at 52 Intervals 15/10/39  Assessment and  Plan  Atrial Flutter  Perhaps some intercurrent but nothing sustained  sinus bradycardia asymptomatic  NICM thought to be rate related  Resolved  Dyspnea on exertion  Hypertension  Chest pain    Reviewed stress test results with patient  reassured

## 2014-12-14 ENCOUNTER — Other Ambulatory Visit: Payer: Self-pay | Admitting: Family Medicine

## 2014-12-16 ENCOUNTER — Other Ambulatory Visit: Payer: Self-pay | Admitting: *Deleted

## 2014-12-16 MED ORDER — WARFARIN SODIUM 2 MG PO TABS: ORAL_TABLET | ORAL | Status: DC

## 2014-12-16 NOTE — Telephone Encounter (Signed)
Pt states he wants refill to be sent to [facility] and this refill sent there

## 2014-12-19 ENCOUNTER — Ambulatory Visit (INDEPENDENT_AMBULATORY_CARE_PROVIDER_SITE_OTHER): Payer: Medicare Other | Admitting: Family Medicine

## 2014-12-19 DIAGNOSIS — E291 Testicular hypofunction: Secondary | ICD-10-CM | POA: Diagnosis not present

## 2014-12-19 MED ORDER — TESTOSTERONE CYPIONATE 200 MG/ML IM SOLN
300.0000 mg | Freq: Once | INTRAMUSCULAR | Status: AC
  Administered 2014-12-19: 300 mg via INTRAMUSCULAR

## 2014-12-25 ENCOUNTER — Ambulatory Visit (INDEPENDENT_AMBULATORY_CARE_PROVIDER_SITE_OTHER): Payer: Medicare Other | Admitting: Pharmacist

## 2014-12-25 DIAGNOSIS — Z5181 Encounter for therapeutic drug level monitoring: Secondary | ICD-10-CM | POA: Diagnosis not present

## 2014-12-25 DIAGNOSIS — I4892 Unspecified atrial flutter: Secondary | ICD-10-CM | POA: Diagnosis not present

## 2014-12-25 LAB — POCT INR: INR: 3.2

## 2014-12-26 ENCOUNTER — Telehealth: Payer: Self-pay | Admitting: Family Medicine

## 2014-12-26 MED ORDER — LEVOTHYROXINE SODIUM 50 MCG PO TABS: 50.0000 ug | ORAL_TABLET | Freq: Every day | ORAL | Status: DC

## 2014-12-26 NOTE — Telephone Encounter (Signed)
RX sent to pharmacy  

## 2014-12-26 NOTE — Telephone Encounter (Signed)
Pt needs levothyroxine 50 mcg #90 w/refills send to [facility] [address]  Pt has been out for a few days

## 2014-12-30 ENCOUNTER — Other Ambulatory Visit: Payer: Self-pay | Admitting: *Deleted

## 2014-12-30 MED ORDER — HYDROCHLOROTHIAZIDE 25 MG PO TABS: 25.0000 mg | ORAL_TABLET | Freq: Every day | ORAL | Status: DC

## 2015-01-02 ENCOUNTER — Ambulatory Visit (INDEPENDENT_AMBULATORY_CARE_PROVIDER_SITE_OTHER): Payer: Medicare Other | Admitting: Family Medicine

## 2015-01-02 ENCOUNTER — Other Ambulatory Visit: Payer: Self-pay

## 2015-01-02 DIAGNOSIS — H524 Presbyopia: Secondary | ICD-10-CM | POA: Diagnosis not present

## 2015-01-02 DIAGNOSIS — E291 Testicular hypofunction: Secondary | ICD-10-CM

## 2015-01-02 DIAGNOSIS — H26492 Other secondary cataract, left eye: Secondary | ICD-10-CM | POA: Diagnosis not present

## 2015-01-02 DIAGNOSIS — R7989 Other specified abnormal findings of blood chemistry: Secondary | ICD-10-CM

## 2015-01-02 MED ORDER — TESTOSTERONE CYPIONATE 200 MG/ML IM SOLN
300.0000 mg | Freq: Once | INTRAMUSCULAR | Status: AC
  Administered 2015-01-02: 300 mg via INTRAMUSCULAR

## 2015-01-02 MED ORDER — ATORVASTATIN CALCIUM 10 MG PO TABS: 10.0000 mg | ORAL_TABLET | Freq: Every day | ORAL | Status: DC

## 2015-01-16 ENCOUNTER — Ambulatory Visit (INDEPENDENT_AMBULATORY_CARE_PROVIDER_SITE_OTHER): Payer: Medicare Other | Admitting: Family Medicine

## 2015-01-16 DIAGNOSIS — E291 Testicular hypofunction: Secondary | ICD-10-CM | POA: Diagnosis not present

## 2015-01-16 MED ORDER — TESTOSTERONE CYPIONATE 200 MG/ML IM SOLN
300.0000 mg | Freq: Once | INTRAMUSCULAR | Status: AC
  Administered 2015-01-16: 300 mg via INTRAMUSCULAR

## 2015-01-22 ENCOUNTER — Ambulatory Visit (INDEPENDENT_AMBULATORY_CARE_PROVIDER_SITE_OTHER): Payer: Medicare Other | Admitting: *Deleted

## 2015-01-22 DIAGNOSIS — Z5181 Encounter for therapeutic drug level monitoring: Secondary | ICD-10-CM | POA: Diagnosis not present

## 2015-01-22 DIAGNOSIS — I4892 Unspecified atrial flutter: Secondary | ICD-10-CM

## 2015-01-22 LAB — POCT INR: INR: 3.1

## 2015-01-24 ENCOUNTER — Encounter: Payer: Self-pay | Admitting: Family Medicine

## 2015-01-24 ENCOUNTER — Ambulatory Visit (INDEPENDENT_AMBULATORY_CARE_PROVIDER_SITE_OTHER): Payer: Medicare Other | Admitting: Family Medicine

## 2015-01-24 VITALS — BP 150/70 | HR 52 | Temp 97.7°F | Ht 69.0 in | Wt 169.2 lb

## 2015-01-24 DIAGNOSIS — D751 Secondary polycythemia: Secondary | ICD-10-CM

## 2015-01-24 DIAGNOSIS — Z23 Encounter for immunization: Secondary | ICD-10-CM

## 2015-01-24 DIAGNOSIS — N183 Chronic kidney disease, stage 3 unspecified: Secondary | ICD-10-CM | POA: Insufficient documentation

## 2015-01-24 DIAGNOSIS — E785 Hyperlipidemia, unspecified: Secondary | ICD-10-CM

## 2015-01-24 DIAGNOSIS — I10 Essential (primary) hypertension: Secondary | ICD-10-CM | POA: Diagnosis not present

## 2015-01-24 DIAGNOSIS — E038 Other specified hypothyroidism: Secondary | ICD-10-CM

## 2015-01-24 LAB — HEPATIC FUNCTION PANEL
ALK PHOS: 41 U/L (ref 39–117)
ALT: 9 U/L (ref 0–53)
AST: 19 U/L (ref 0–37)
Albumin: 4.2 g/dL (ref 3.5–5.2)
BILIRUBIN DIRECT: 0.2 mg/dL (ref 0.0–0.3)
BILIRUBIN TOTAL: 1 mg/dL (ref 0.2–1.2)
TOTAL PROTEIN: 7 g/dL (ref 6.0–8.3)

## 2015-01-24 LAB — BASIC METABOLIC PANEL
BUN: 37 mg/dL — ABNORMAL HIGH (ref 6–23)
CO2: 32 mEq/L (ref 19–32)
CREATININE: 1.92 mg/dL — AB (ref 0.40–1.50)
Calcium: 9.3 mg/dL (ref 8.4–10.5)
Chloride: 102 mEq/L (ref 96–112)
GFR: 35.39 mL/min — ABNORMAL LOW (ref >60.00)
Glucose, Bld: 58 mg/dL — ABNORMAL LOW (ref 70–99)
Potassium: 5.2 mEq/L — ABNORMAL HIGH (ref 3.5–5.1)
Sodium: 139 mEq/L (ref 135–145)

## 2015-01-24 LAB — CBC WITH DIFFERENTIAL/PLATELET
BASOS PCT: 0.3 % (ref 0.0–3.0)
Basophils Absolute: 0 10*3/uL (ref 0.0–0.1)
Eosinophils Absolute: 0.1 10*3/uL (ref 0.0–0.7)
Eosinophils Relative: 1.5 % (ref 0.0–5.0)
HEMATOCRIT: 53.8 % — AB (ref 39.0–52.0)
Hemoglobin: 17.9 g/dL — ABNORMAL HIGH (ref 13.0–17.0)
Lymphocytes Relative: 10.4 % — ABNORMAL LOW (ref 12.0–46.0)
Lymphs Abs: 0.7 10*3/uL (ref 0.7–4.0)
MCHC: 33.2 g/dL (ref 30.0–36.0)
MCV: 91 fl (ref 78.0–100.0)
MONO ABS: 0.7 10*3/uL (ref 0.1–1.0)
Monocytes Relative: 11.5 % (ref 3.0–12.0)
NEUTROS ABS: 4.9 10*3/uL (ref 1.4–7.7)
Neutrophils Relative %: 76.3 % (ref 43.0–77.0)
PLATELETS: 155 10*3/uL (ref 150.0–400.0)
RBC: 5.92 Mil/uL — ABNORMAL HIGH (ref 4.22–5.81)
RDW: 15.4 % (ref 11.5–15.5)
WBC: 6.5 10*3/uL (ref 4.0–10.5)

## 2015-01-24 LAB — LIPID PANEL
CHOL/HDL RATIO: 3
CHOLESTEROL: 121 mg/dL (ref 0–200)
HDL: 35.2 mg/dL — ABNORMAL LOW (ref >39.00)
LDL Cholesterol: 63 mg/dL (ref 0–99)
NonHDL: 85.36
TRIGLYCERIDES: 113 mg/dL (ref 0.0–149.0)
VLDL: 22.6 mg/dL (ref 0.0–40.0)

## 2015-01-24 NOTE — Progress Notes (Signed)
Subjective:    Patient ID: [name], male    DOB: 02/24/1926, 81 y.o.   MRN: [id number]  HPI Patient seen for follow-up multiple medical problems. Has history of atrial flutter, hypothyroidism, low testosterone, systolic heart failure, hypertension. In reviewing his medications he apparently has only been taking his enalapril once daily. He's had some borderline elevated blood pressures recently. He also has chronic kidney disease stage III. He had labs through cardiology with most recent GFR 38.  Hyperlipidemia treated with atorvastatin. He is due for repeat lab work. He did have TSH through cardiology recently that was normal. He has history of low testosterone had recent CBC with cardiology with hemoglobin 17.7.  Past Medical History  Diagnosis Date  . AF (atrial fibrillation)     a. s/p RFCA/PVI @ [facility] 2007.  Marland Kitchen Neoplasm of uncertain behavior     SKIN  . Hyperlipidemia   . Hypothyroidism   . HYPERTENSION, BENIGN 08/21/2009    Qualifier: Diagnosis of  By: Caryl Comes, MD, Remus Blake  Medications(s)  enalapril   . Polymyalgia rheumatica 01/13/2007    Qualifier: History of  By: Danny Lawless CMA, [place]     . Arthritis     "joints" (06/21/2013)  . Atrial flutter     a. 06/2013 s/p TEE/DCCV  . Chronic systolic CHF (congestive heart failure)     a. 06/2013 Echo: EF 20%, diff HK, mild LVH.  Marland Kitchen History of cardiovascular stress test     Lexiscan Myoview 8/16:  EF 59%, attenuation artifact, no ischemia; Low Risk   Past Surgical History  Procedure Laterality Date  . Hand surgery Right     "broke bone; grafted area w/bone from somewhere else"  . Cataract extraction, bilateral Bilateral Summer 2009  . Tonsillectomy    . Cholecystectomy    . Inguinal hernia repair Bilateral   . Atrial fibrillation ablation  2007  . Hemorrhoid surgery    . Tee without cardioversion N/A 06/22/2013    Procedure: TRANSESOPHAGEAL ECHOCARDIOGRAM (TEE);  Surgeon: Sueanne Margarita, MD;  Location: [place];  Service: Cardiovascular;  Laterality: N/A;  . Cardioversion N/A 06/22/2013    Procedure: CARDIOVERSION;  Surgeon: Sueanne Margarita, MD;  Location: [facility] ENDOSCOPY;  Service: Cardiovascular;  Laterality: N/A;  15:17 Lido 20mg ,IV , Propofol 30 mg, IV synched cardioversion @ 150 joules by Dr. Lajoyce Lauber from a flutter to sinis brady which is baseline for this pt, reports 45-50 reg HR verified by Dr. Ashok Norris  . Cardioversion N/A 11/21/2014    Procedure: CARDIOVERSION;  Surgeon: Fay Records, MD;  Location: [facility] ENDOSCOPY;  Service: Cardiovascular;  Laterality: N/A;    reports that he quit smoking about 51 years ago. His smoking use included Cigarettes. He has a 12 pack-year smoking history. He has never used smokeless tobacco. He reports that he drinks alcohol. He reports that he does not use illicit drugs. family history includes Diabetes in his mother; Emphysema in his mother; Kidney disease in his father. No Known Allergies    Review of Systems  Constitutional: Negative for fatigue.  Eyes: Negative for visual disturbance.  Respiratory: Negative for cough, chest tightness and shortness of breath.   Cardiovascular: Negative for chest pain, palpitations and leg swelling.  Endocrine: Negative for polydipsia and polyuria.  Neurological: Negative for dizziness, syncope, weakness, light-headedness and headaches.       Objective:   Physical Exam  Constitutional: He is oriented to person, place, and time. He appears well-developed and well-nourished.  HENT:  Right Ear: External ear normal.  Left Ear: External ear normal.  Mouth/Throat: Oropharynx is clear and moist.  Eyes: Pupils are equal, round, and reactive to light.  Neck: Neck supple. No thyromegaly present.  Cardiovascular: Normal rate and regular rhythm.   Pulmonary/Chest: Effort normal and breath sounds normal. No respiratory distress. He has no wheezes. He has no rales.  Musculoskeletal: He exhibits no edema.  Neurological: He is  alert and oriented to person, place, and time.          Assessment & Plan:  #1 hypertension. Slightly elevated today with 150/70 repeat reading after rest. He had some other recent elevated readings. In reviewing his medication he is only taking enalapril once daily. We've encouraged him to increase this to twice daily and reassess blood pressure one month. If not closer to goal that point consider addition of low-dose amlodipine #2 hyperlipidemia. Check lipid and hepatic panel  #3 hypothyroidism. Recent TSH at goal. #4 low testosterone.He had recent mild polycythemia which could be related (to testosterone replacement). Recent hemoglobin 17.7. Recheck CBC. If hemoglobin and hematocrit elevating may need to discontinue his testosterone #5 health maintenance. Flu vaccine given

## 2015-01-24 NOTE — Patient Instructions (Signed)
Increase the Enalapril to one TWICE daily.

## 2015-01-24 NOTE — Progress Notes (Signed)
Pre visit review using our clinic review tool, if applicable. No additional management support is needed unless otherwise documented below in the visit note. 

## 2015-01-26 ENCOUNTER — Encounter: Payer: Self-pay | Admitting: Family Medicine

## 2015-01-30 ENCOUNTER — Ambulatory Visit (INDEPENDENT_AMBULATORY_CARE_PROVIDER_SITE_OTHER): Payer: Medicare Other | Admitting: Family Medicine

## 2015-01-30 DIAGNOSIS — E291 Testicular hypofunction: Secondary | ICD-10-CM | POA: Diagnosis not present

## 2015-01-30 DIAGNOSIS — E349 Endocrine disorder, unspecified: Secondary | ICD-10-CM

## 2015-01-30 MED ORDER — TESTOSTERONE CYPIONATE 200 MG/ML IM SOLN
100.0000 mg | Freq: Once | INTRAMUSCULAR | Status: AC
  Administered 2015-01-30: 100 mg via INTRAMUSCULAR

## 2015-02-12 ENCOUNTER — Ambulatory Visit (INDEPENDENT_AMBULATORY_CARE_PROVIDER_SITE_OTHER): Payer: Medicare Other | Admitting: *Deleted

## 2015-02-12 DIAGNOSIS — Z5181 Encounter for therapeutic drug level monitoring: Secondary | ICD-10-CM | POA: Diagnosis not present

## 2015-02-12 DIAGNOSIS — I4892 Unspecified atrial flutter: Secondary | ICD-10-CM

## 2015-02-12 LAB — POCT INR: INR: 3.7

## 2015-02-13 ENCOUNTER — Ambulatory Visit (INDEPENDENT_AMBULATORY_CARE_PROVIDER_SITE_OTHER): Payer: Medicare Other | Admitting: *Deleted

## 2015-02-13 DIAGNOSIS — E291 Testicular hypofunction: Secondary | ICD-10-CM

## 2015-02-13 DIAGNOSIS — E349 Endocrine disorder, unspecified: Secondary | ICD-10-CM

## 2015-02-13 MED ORDER — TESTOSTERONE CYPIONATE 200 MG/ML IM SOLN
100.0000 mg | Freq: Once | INTRAMUSCULAR | Status: AC
  Administered 2015-02-13: 100 mg via INTRAMUSCULAR

## 2015-02-21 ENCOUNTER — Ambulatory Visit (INDEPENDENT_AMBULATORY_CARE_PROVIDER_SITE_OTHER): Payer: Medicare Other | Admitting: Family Medicine

## 2015-02-21 ENCOUNTER — Encounter: Payer: Self-pay | Admitting: Family Medicine

## 2015-02-21 VITALS — BP 160/70 | HR 58 | Temp 97.4°F | Wt 169.8 lb

## 2015-02-21 DIAGNOSIS — I10 Essential (primary) hypertension: Secondary | ICD-10-CM | POA: Diagnosis not present

## 2015-02-21 MED ORDER — AMLODIPINE BESY-BENAZEPRIL HCL 5-10 MG PO CAPS: 1.0000 | ORAL_CAPSULE | Freq: Every day | ORAL | Status: DC

## 2015-02-21 NOTE — Progress Notes (Signed)
Pre visit review using our clinic review tool, if applicable. No additional management support is needed unless otherwise documented below in the visit note. 

## 2015-02-21 NOTE — Progress Notes (Signed)
Subjective:    Patient ID: [name], male    DOB: [birth year], 81 y.o.   MRN: [id number]  HPI Follow-up hypertension. Refer to recent note. Patient had elevated readings here and we increased his enalapril to 10 mg twice daily. He also remains on metoprolol and HCTZ. He brings in readings from past couple weeks and he's had consistent readings of 627O to 350 systolic with occasional reading around 093 with diastolics mostly 81W. No recent headaches. No chest pains. No peripheral edema.  Past Medical History  Diagnosis Date  . AF (atrial fibrillation) [facility])     a. s/p RFCA/PVI @ [facility] clinic 2007.  Marland Kitchen Neoplasm of uncertain behavior     SKIN  . Hyperlipidemia   . Hypothyroidism   . HYPERTENSION, BENIGN 08/21/2009    Qualifier: Diagnosis of  By: Caryl Comes, MD, Remus Blake  Medications(s)  enalapril   . Polymyalgia rheumatica ([place]) 01/13/2007    Qualifier: History of  By: Danny Lawless CMA, [place]     . Arthritis     "joints" (06/21/2013)  . Atrial flutter ([place])     a. 06/2013 s/p TEE/DCCV  . Chronic systolic CHF (congestive heart failure) ([place])     a. 06/2013 Echo: EF 20%, diff HK, mild LVH.  Marland Kitchen History of cardiovascular stress test     Lexiscan Myoview 8/16:  EF 59%, attenuation artifact, no ischemia; Low Risk   Past Surgical History  Procedure Laterality Date  . Hand surgery Right     "broke bone; grafted area w/bone from somewhere else"  . Cataract extraction, bilateral Bilateral Summer 2009  . Tonsillectomy    . Cholecystectomy    . Inguinal hernia repair Bilateral   . Atrial fibrillation ablation  2007  . Hemorrhoid surgery    . Tee without cardioversion N/A 06/22/2013    Procedure: TRANSESOPHAGEAL ECHOCARDIOGRAM (TEE);  Surgeon: Sueanne Margarita, MD;  Location: [place];  Service: Cardiovascular;  Laterality: N/A;  . Cardioversion N/A 06/22/2013    Procedure: CARDIOVERSION;  Surgeon: Sueanne Margarita, MD;  Location: [facility] ENDOS[facility];  Service: Cardiovascular;  Laterality:  N/A;  15:17 Lido 20mg ,IV , Propofol 30 mg, IV synched cardioversion @ 150 joules by Dr. Lajoyce Lauber from a flutter to sinis brady which is baseline for this pt, reports 45-50 reg HR verified by Dr. Ashok Norris  . Cardioversion N/A 11/21/2014    Procedure: CARDIOVERSION;  Surgeon: Fay Records, MD;  Location: [facility] ENDOSCOPY;  Service: Cardiovascular;  Laterality: N/A;    reports that he quit smoking about 51 years ago. His smoking use included Cigarettes. He has a 12 pack-year smoking history. He has never used smokeless tobacco. He reports that he drinks alcohol. He reports that he does not use illicit drugs. family history includes Diabetes in his mother; Emphysema in his mother; Kidney disease in his father. No Known Allergies    Review of Systems  Constitutional: Negative for fatigue.  Eyes: Negative for visual disturbance.  Respiratory: Negative for cough, chest tightness and shortness of breath.   Cardiovascular: Negative for chest pain, palpitations and leg swelling.  Endocrine: Negative for polydipsia and polyuria.  Neurological: Negative for dizziness, syncope, weakness, light-headedness and headaches.       Objective:   Physical Exam  Constitutional: He appears well-developed and well-nourished. No distress.  Neck: Neck supple. No thyromegaly present.  Cardiovascular: Normal rate and regular rhythm.   Pulmonary/Chest: Effort normal and breath sounds normal. No respiratory distress. He has no wheezes. He has no rales.  Musculoskeletal: He exhibits no edema.          Assessment & Plan:  Hypertension. Poorly controlled systolic blood pressure. Continue HCTZ and metoprolol. Discontinue enalapril. Start Lotrel 5/10 mg 1 daily. Reassess in 2 months

## 2015-02-21 NOTE — Patient Instructions (Signed)
STOP THE ENALAPRIL START LOTREL 5/10 ONE DAILY.

## 2015-02-27 ENCOUNTER — Other Ambulatory Visit: Payer: Self-pay

## 2015-02-27 ENCOUNTER — Ambulatory Visit (INDEPENDENT_AMBULATORY_CARE_PROVIDER_SITE_OTHER): Payer: Medicare Other | Admitting: Family Medicine

## 2015-02-27 DIAGNOSIS — E291 Testicular hypofunction: Secondary | ICD-10-CM

## 2015-02-27 MED ORDER — TESTOSTERONE CYPIONATE 200 MG/ML IM SOLN: 300.0000 mg | INTRAMUSCULAR | Status: DC

## 2015-02-27 MED ORDER — TESTOSTERONE CYPIONATE 200 MG/ML IM SOLN
250.0000 mg | INTRAMUSCULAR | Status: DC
  Administered 2015-02-27: 250 mg via INTRAMUSCULAR

## 2015-02-27 NOTE — Addendum Note (Signed)
Addended by: Ailene Rud E on: 02/27/2015 02:00 PM   Modules accepted: Orders

## 2015-02-27 NOTE — Addendum Note (Signed)
Addended by: Ailene Rud E on: 02/27/2015 02:09 PM   Modules accepted: Orders, Medications

## 2015-03-05 ENCOUNTER — Ambulatory Visit (INDEPENDENT_AMBULATORY_CARE_PROVIDER_SITE_OTHER): Payer: Medicare Other | Admitting: *Deleted

## 2015-03-05 DIAGNOSIS — Z5181 Encounter for therapeutic drug level monitoring: Secondary | ICD-10-CM

## 2015-03-05 DIAGNOSIS — I4892 Unspecified atrial flutter: Secondary | ICD-10-CM

## 2015-03-05 LAB — POCT INR: INR: 3.6

## 2015-03-13 ENCOUNTER — Ambulatory Visit (INDEPENDENT_AMBULATORY_CARE_PROVIDER_SITE_OTHER): Payer: Medicare Other | Admitting: Family Medicine

## 2015-03-13 DIAGNOSIS — E291 Testicular hypofunction: Secondary | ICD-10-CM

## 2015-03-13 MED ORDER — TESTOSTERONE CYPIONATE 200 MG/ML IM SOLN
150.0000 mg | INTRAMUSCULAR | Status: DC
  Administered 2015-03-13: 150 mg via INTRAMUSCULAR

## 2015-03-24 ENCOUNTER — Ambulatory Visit (INDEPENDENT_AMBULATORY_CARE_PROVIDER_SITE_OTHER): Payer: Medicare Other

## 2015-03-24 DIAGNOSIS — Z5181 Encounter for therapeutic drug level monitoring: Secondary | ICD-10-CM | POA: Diagnosis not present

## 2015-03-24 DIAGNOSIS — I4892 Unspecified atrial flutter: Secondary | ICD-10-CM | POA: Diagnosis not present

## 2015-03-24 LAB — POCT INR: INR: 2.2

## 2015-03-26 ENCOUNTER — Ambulatory Visit (INDEPENDENT_AMBULATORY_CARE_PROVIDER_SITE_OTHER): Payer: Medicare Other | Admitting: Family Medicine

## 2015-03-26 DIAGNOSIS — E291 Testicular hypofunction: Secondary | ICD-10-CM

## 2015-03-26 MED ORDER — TESTOSTERONE CYPIONATE 200 MG/ML IM SOLN: 1.5000 mg | INTRAMUSCULAR | Status: DC

## 2015-03-26 MED ORDER — TESTOSTERONE CYPIONATE 200 MG/ML IM SOLN
200.0000 mg | INTRAMUSCULAR | Status: DC
  Administered 2015-03-26: 150 mg via INTRAMUSCULAR

## 2015-03-26 MED ORDER — TESTOSTERONE CYPIONATE 200 MG/ML IM SOLN: 300.0000 mg | INTRAMUSCULAR | Status: DC

## 2015-04-02 DIAGNOSIS — M25571 Pain in right ankle and joints of right foot: Secondary | ICD-10-CM | POA: Diagnosis not present

## 2015-04-02 DIAGNOSIS — S93491A Sprain of other ligament of right ankle, initial encounter: Secondary | ICD-10-CM | POA: Diagnosis not present

## 2015-04-07 ENCOUNTER — Other Ambulatory Visit: Payer: Self-pay | Admitting: *Deleted

## 2015-04-07 MED ORDER — WARFARIN SODIUM 2 MG PO TABS: ORAL_TABLET | ORAL | Status: DC

## 2015-04-10 ENCOUNTER — Other Ambulatory Visit: Payer: Self-pay | Admitting: Family Medicine

## 2015-04-10 ENCOUNTER — Ambulatory Visit (INDEPENDENT_AMBULATORY_CARE_PROVIDER_SITE_OTHER): Payer: Medicare Other | Admitting: Family Medicine

## 2015-04-10 DIAGNOSIS — E291 Testicular hypofunction: Secondary | ICD-10-CM

## 2015-04-10 MED ORDER — TESTOSTERONE CYPIONATE 200 MG/ML IM SOLN
250.0000 mg | INTRAMUSCULAR | Status: DC
  Administered 2015-04-10: 250 mg via INTRAMUSCULAR

## 2015-04-17 ENCOUNTER — Encounter: Payer: Self-pay | Admitting: Certified Registered Nurse Anesthetist

## 2015-04-17 DIAGNOSIS — M545 Low back pain: Secondary | ICD-10-CM | POA: Diagnosis not present

## 2015-04-17 DIAGNOSIS — M25571 Pain in right ankle and joints of right foot: Secondary | ICD-10-CM | POA: Diagnosis not present

## 2015-04-17 DIAGNOSIS — S93491D Sprain of other ligament of right ankle, subsequent encounter: Secondary | ICD-10-CM | POA: Diagnosis not present

## 2015-04-21 ENCOUNTER — Telehealth: Payer: Self-pay | Admitting: Family Medicine

## 2015-04-21 MED ORDER — LEVOTHYROXINE SODIUM 50 MCG PO TABS: 50.0000 ug | ORAL_TABLET | Freq: Every day | ORAL | Status: DC

## 2015-04-21 NOTE — Telephone Encounter (Signed)
Medication refilled

## 2015-04-21 NOTE — Telephone Encounter (Signed)
Pt request refill of the following: levothyroxine (SYNTHROID, LEVOTHROID) 50 MCG tablet   Phamacy: [facility]

## 2015-04-21 NOTE — Progress Notes (Signed)
Medication given (testosterone) for nurse visit was patient supplied.

## 2015-04-22 ENCOUNTER — Ambulatory Visit (INDEPENDENT_AMBULATORY_CARE_PROVIDER_SITE_OTHER): Payer: Medicare Other | Admitting: Pharmacist

## 2015-04-22 DIAGNOSIS — I4892 Unspecified atrial flutter: Secondary | ICD-10-CM | POA: Diagnosis not present

## 2015-04-22 DIAGNOSIS — Z5181 Encounter for therapeutic drug level monitoring: Secondary | ICD-10-CM

## 2015-04-22 LAB — POCT INR: INR: 2.2

## 2015-04-25 ENCOUNTER — Ambulatory Visit (INDEPENDENT_AMBULATORY_CARE_PROVIDER_SITE_OTHER): Payer: Medicare Other | Admitting: Family Medicine

## 2015-04-25 ENCOUNTER — Encounter: Payer: Self-pay | Admitting: Family Medicine

## 2015-04-25 VITALS — BP 140/80 | HR 58 | Temp 97.6°F | Wt 170.0 lb

## 2015-04-25 DIAGNOSIS — E291 Testicular hypofunction: Secondary | ICD-10-CM | POA: Diagnosis not present

## 2015-04-25 DIAGNOSIS — I10 Essential (primary) hypertension: Secondary | ICD-10-CM | POA: Diagnosis not present

## 2015-04-25 MED ORDER — TESTOSTERONE CYPIONATE 200 MG/ML IM SOLN
150.0000 mg | Freq: Once | INTRAMUSCULAR | Status: AC
  Administered 2015-04-25: 150 mg via INTRAMUSCULAR

## 2015-04-25 NOTE — Progress Notes (Signed)
Pre visit review using our clinic review tool, if applicable. No additional management support is needed unless otherwise documented below in the visit note. 

## 2015-04-25 NOTE — Patient Instructions (Signed)
Monitor blood pressure and be in touch if consistently > 150/90 

## 2015-04-25 NOTE — Addendum Note (Signed)
Addended by: Westley Hummer B on: 04/25/2015 11:40 AM   Modules accepted: Orders

## 2015-04-25 NOTE — Progress Notes (Signed)
Subjective:    Patient ID: [name], male    DOB: Sep 04, 1925, 81 y.o.   MRN: [id number]  HPI  Follow-up hypertension. Patient seen back in October with elevated readings. We continued his HCTZ and metoprolol and discontinued enalapril and started Lotrel He's had no side effects. No headaches. No dizziness. No peripheral edema. Brings log of home readings. Consistently [id number] to [id number] systolic and 0000000 to [id number] diastolic.  No recent chest pains. Compliant with therapy.  Past Medical History  Diagnosis Date  . AF (atrial fibrillation) [facility])     a. s/p RFCA/PVI @ [facility] clinic 2007.  Marland Kitchen Neoplasm of uncertain behavior     SKIN  . Hyperlipidemia   . Hypothyroidism   . HYPERTENSION, BENIGN 08/21/2009    Qualifier: Diagnosis of  By: Caryl Comes, MD, Remus Blake  Medications(s)  enalapril   . Polymyalgia rheumatica ([place]) 01/13/2007    Qualifier: History of  By: Danny Lawless CMA, [place]     . Arthritis     "joints" (06/21/2013)  . Atrial flutter ([place])     a. 06/2013 s/p TEE/DCCV  . Chronic systolic CHF (congestive heart failure) ([place])     a. 06/2013 Echo: EF 20%, diff HK, mild LVH.  Marland Kitchen History of cardiovascular stress test     Lexiscan Myoview 8/16:  EF 59%, attenuation artifact, no ischemia; Low Risk   Past Surgical History  Procedure Laterality Date  . Hand surgery Right     "broke bone; grafted area w/bone from somewhere else"  . Cataract extraction, bilateral Bilateral Summer 2009  . Tonsillectomy    . Cholecystectomy    . Inguinal hernia repair Bilateral   . Atrial fibrillation ablation  2007  . Hemorrhoid surgery    . Tee without cardioversion N/A 06/22/2013    Procedure: TRANSESOPHAGEAL ECHOCARDIOGRAM (TEE);  Surgeon: Sueanne Margarita, MD;  Location: [place];  Service: Cardiovascular;  Laterality: N/A;  . Cardioversion N/A 06/22/2013    Procedure: CARDIOVERSION;  Surgeon: Sueanne Margarita, MD;  Location: [facility] ENDOSCOPY;  Service: Cardiovascular;  Laterality: N/A;  15:17 Lido  20mg ,IV , Propofol 30 mg, IV synched cardioversion @ 150 joules by Dr. Lajoyce Lauber from a flutter to sinis brady which is baseline for this pt, reports 45-50 reg HR verified by Dr. Ashok Norris  . Cardioversion N/A 11/21/2014    Procedure: CARDIOVERSION;  Surgeon: Fay Records, MD;  Location: [facility] ENDOSCOPY;  Service: Cardiovascular;  Laterality: N/A;    reports that he quit smoking about 52 years ago. His smoking use included Cigarettes. He has a 12 pack-year smoking history. He has never used smokeless tobacco. He reports that he drinks alcohol. He reports that he does not use illicit drugs. family history includes Diabetes in his mother; Emphysema in his mother; Kidney disease in his father. No Known Allergies   Review of Systems  Constitutional: Negative for fatigue.  Eyes: Negative for visual disturbance.  Respiratory: Negative for cough, chest tightness and shortness of breath.   Cardiovascular: Negative for chest pain, palpitations and leg swelling.  Endocrine: Negative for polydipsia and polyuria.  Neurological: Negative for dizziness, syncope, weakness, light-headedness and headaches.       Objective:   Physical Exam  Constitutional: He is oriented to person, place, and time. He appears well-developed and well-nourished.  HENT:  Right Ear: External ear normal.  Left Ear: External ear normal.  Mouth/Throat: Oropharynx is clear and moist.  Eyes: Pupils are equal, round, and reactive to light.  Neck: Neck supple.  No thyromegaly present.  Cardiovascular: Normal rate and regular rhythm.   Pulmonary/Chest: Effort normal and breath sounds normal. No respiratory distress. He has no wheezes. He has no rales.  Musculoskeletal: He exhibits no edema.  Neurological: He is alert and oriented to person, place, and time.          Assessment & Plan:  Hypertension. Improved. He's had consistently good control by home readings. Initially elevated reading here today 180/90 repeated by me left arm at  rest 140/80. Continue current medications. Follow-up 3 months. Recheck basic metabolic panel then.

## 2015-05-08 ENCOUNTER — Other Ambulatory Visit: Payer: Self-pay | Admitting: Family Medicine

## 2015-05-08 ENCOUNTER — Ambulatory Visit (INDEPENDENT_AMBULATORY_CARE_PROVIDER_SITE_OTHER): Payer: Medicare Other | Admitting: Family Medicine

## 2015-05-08 DIAGNOSIS — E291 Testicular hypofunction: Secondary | ICD-10-CM | POA: Diagnosis not present

## 2015-05-08 MED ORDER — TESTOSTERONE CYPIONATE 200 MG/ML IM SOLN
250.0000 mg | INTRAMUSCULAR | Status: DC
  Administered 2015-05-08: 250 mg via INTRAMUSCULAR

## 2015-05-08 MED ORDER — TESTOSTERONE CYPIONATE 200 MG/ML IM SOLN: 300.0000 mg | INTRAMUSCULAR | Status: DC

## 2015-05-20 ENCOUNTER — Ambulatory Visit (INDEPENDENT_AMBULATORY_CARE_PROVIDER_SITE_OTHER): Payer: Medicare Other | Admitting: *Deleted

## 2015-05-20 DIAGNOSIS — I4892 Unspecified atrial flutter: Secondary | ICD-10-CM | POA: Diagnosis not present

## 2015-05-20 DIAGNOSIS — Z5181 Encounter for therapeutic drug level monitoring: Secondary | ICD-10-CM

## 2015-05-20 LAB — POCT INR: INR: 2.6

## 2015-05-21 ENCOUNTER — Ambulatory Visit: Payer: Medicare Other | Admitting: Family Medicine

## 2015-05-21 ENCOUNTER — Other Ambulatory Visit: Payer: Self-pay | Admitting: Family Medicine

## 2015-05-21 DIAGNOSIS — E291 Testicular hypofunction: Secondary | ICD-10-CM

## 2015-05-21 MED ORDER — TESTOSTERONE CYPIONATE 200 MG/ML IM SOLN
300.0000 mg | Freq: Once | INTRAMUSCULAR | Status: AC
  Administered 2015-05-21: 300 mg via INTRAMUSCULAR

## 2015-05-21 MED ORDER — TESTOSTERONE CYPIONATE 200 MG/ML IM SOLN: 200.0000 mg | Freq: Once | INTRAMUSCULAR | Status: DC

## 2015-06-05 ENCOUNTER — Ambulatory Visit (INDEPENDENT_AMBULATORY_CARE_PROVIDER_SITE_OTHER): Payer: Medicare Other | Admitting: Family Medicine

## 2015-06-05 DIAGNOSIS — E291 Testicular hypofunction: Secondary | ICD-10-CM | POA: Diagnosis not present

## 2015-06-05 MED ORDER — TESTOSTERONE CYPIONATE 200 MG/ML IM SOLN
200.0000 mg | INTRAMUSCULAR | Status: DC
  Administered 2015-06-05 (×2): 200 mg via INTRAMUSCULAR

## 2015-06-18 ENCOUNTER — Encounter: Payer: Self-pay | Admitting: Gastroenterology

## 2015-06-19 ENCOUNTER — Ambulatory Visit (INDEPENDENT_AMBULATORY_CARE_PROVIDER_SITE_OTHER): Payer: Medicare Other | Admitting: Family Medicine

## 2015-06-19 DIAGNOSIS — E291 Testicular hypofunction: Secondary | ICD-10-CM

## 2015-06-19 MED ORDER — TESTOSTERONE CYPIONATE 200 MG/ML IM SOLN
250.0000 mg | INTRAMUSCULAR | Status: DC
  Administered 2015-06-19: 250 mg via INTRAMUSCULAR

## 2015-07-01 ENCOUNTER — Ambulatory Visit (INDEPENDENT_AMBULATORY_CARE_PROVIDER_SITE_OTHER): Payer: Medicare Other

## 2015-07-01 DIAGNOSIS — I4892 Unspecified atrial flutter: Secondary | ICD-10-CM | POA: Diagnosis not present

## 2015-07-01 DIAGNOSIS — Z5181 Encounter for therapeutic drug level monitoring: Secondary | ICD-10-CM

## 2015-07-01 LAB — POCT INR: INR: 2.3

## 2015-07-03 ENCOUNTER — Ambulatory Visit (INDEPENDENT_AMBULATORY_CARE_PROVIDER_SITE_OTHER): Payer: Medicare Other | Admitting: Family Medicine

## 2015-07-03 DIAGNOSIS — E291 Testicular hypofunction: Secondary | ICD-10-CM

## 2015-07-03 MED ORDER — TESTOSTERONE CYPIONATE 200 MG/ML IM SOLN
250.0000 mg | INTRAMUSCULAR | Status: DC
  Administered 2015-07-03: 250 mg via INTRAMUSCULAR

## 2015-07-03 MED ORDER — TESTOSTERONE CYPIONATE 200 MG/ML IM SOLN: 200.0000 mg | INTRAMUSCULAR | Status: DC

## 2015-07-04 ENCOUNTER — Encounter: Payer: Self-pay | Admitting: Cardiology

## 2015-07-04 ENCOUNTER — Ambulatory Visit (INDEPENDENT_AMBULATORY_CARE_PROVIDER_SITE_OTHER): Payer: Medicare Other | Admitting: Cardiology

## 2015-07-04 ENCOUNTER — Ambulatory Visit: Payer: Medicare Other | Admitting: Internal Medicine

## 2015-07-04 VITALS — BP 132/98 | HR 119 | Ht 69.0 in | Wt 170.8 lb

## 2015-07-04 DIAGNOSIS — N183 Chronic kidney disease, stage 3 unspecified: Secondary | ICD-10-CM

## 2015-07-04 DIAGNOSIS — I10 Essential (primary) hypertension: Secondary | ICD-10-CM

## 2015-07-04 DIAGNOSIS — I429 Cardiomyopathy, unspecified: Secondary | ICD-10-CM

## 2015-07-04 DIAGNOSIS — I428 Other cardiomyopathies: Secondary | ICD-10-CM

## 2015-07-04 DIAGNOSIS — Z0000 Encounter for general adult medical examination without abnormal findings: Secondary | ICD-10-CM | POA: Diagnosis not present

## 2015-07-04 DIAGNOSIS — I480 Paroxysmal atrial fibrillation: Secondary | ICD-10-CM | POA: Diagnosis not present

## 2015-07-04 DIAGNOSIS — I4892 Unspecified atrial flutter: Secondary | ICD-10-CM | POA: Diagnosis not present

## 2015-07-04 DIAGNOSIS — R350 Frequency of micturition: Secondary | ICD-10-CM | POA: Diagnosis not present

## 2015-07-04 DIAGNOSIS — Z7901 Long term (current) use of anticoagulants: Secondary | ICD-10-CM

## 2015-07-04 DIAGNOSIS — N5201 Erectile dysfunction due to arterial insufficiency: Secondary | ICD-10-CM | POA: Diagnosis not present

## 2015-07-04 DIAGNOSIS — I484 Atypical atrial flutter: Secondary | ICD-10-CM

## 2015-07-04 MED ORDER — METOPROLOL SUCCINATE ER 25 MG PO TB24: 37.5000 mg | ORAL_TABLET | Freq: Two times a day (BID) | ORAL | Status: DC

## 2015-07-04 MED ORDER — BENAZEPRIL HCL 10 MG PO TABS: 10.0000 mg | ORAL_TABLET | Freq: Every day | ORAL | Status: DC

## 2015-07-04 NOTE — Assessment & Plan Note (Signed)
Last SCr 1.9 

## 2015-07-04 NOTE — Assessment & Plan Note (Signed)
S/P Ablation procedure at [facility] in 2007 Multiple cardioversions, baseline sinus bradycardia limits Rx

## 2015-07-04 NOTE — Assessment & Plan Note (Signed)
Coumadin Rx 

## 2015-07-04 NOTE — Assessment & Plan Note (Signed)
EF as low as 20% in the past when in AF- NL LVF by [facility] Aug 2016

## 2015-07-04 NOTE — Patient Instructions (Addendum)
Medication Instructions:  Your physician has recommended you make the following change in your medication:  1.  STOP the Lotrel 2.  START THE Benazepril 10 mg taking 1 tablet daily 3.  INCREASE the Metoprolol 25 mg taking 1 1/2 tablet twice a day   Labwork: None ordered  Testing/Procedures: None ordered  Follow-Up: Your physician recommends that you schedule a follow-up appointment 1 WEEK WITH THE AFIB CLINIC  Any Other Special Instructions Will Be Listed Below (If Applicable).    If you need a refill on your cardiac medications before your next appointment, please call your pharmacy.

## 2015-07-04 NOTE — Progress Notes (Signed)
07/04/2015 [name]   1925/09/07  [place]:[id number]  Primary Physician [name], MD Primary Cardiologist: Dr Caryl Comes  HPI:  82 y/o male followed by Dr Caryl Comes with a history of atrial flutter. He is s/p RFA in 2007 at [facility] clinic. He has had recurrent atrial flutter. His last DCCV was in July 2016. He was put on Amiodarone briefly but couldn't tolerate it secondary to bradycardia when in NSR. He was later placed on low dose beta blocker and had been doing well. Today he was at his urologist office when he was noted to have a rapid HR. He is seen now as an add on in the [facility]. He is asymptomatic. HR 120- 2:1 flutter.   Current Outpatient Prescriptions  Medication Sig Dispense Refill  . atorvastatin (LIPITOR) 10 MG tablet Take 1 tablet (10 mg total) by mouth daily. 90 tablet 1  . carboxymethylcellulose (REFRESH TEARS) 0.5 % SOLN Place 1 drop into both eyes 3 (three) times daily as needed (dry eyes).     . cholecalciferol (VITAMIN D) 1000 UNITS tablet Take 1,000 Units by mouth every morning.    . Glucosamine-Chondroitin 500-400 MG CAPS Take 1 capsule by mouth every morning.    . hydrochlorothiazide (HYDRODIURIL) 25 MG tablet Take 1 tablet (25 mg total) by mouth daily. 90 tablet 2  . levothyroxine (SYNTHROID, LEVOTHROID) 50 MCG tablet Take 1 tablet (50 mcg total) by mouth daily. 90 tablet 2  . metoprolol succinate (TOPROL-XL) 25 MG 24 hr tablet Take 1.5 tablets (37.5 mg total) by mouth 2 (two) times daily. 180 tablet 1  . Multiple Vitamins-Minerals (CENTRUM SILVER PO) Take 1 tablet by mouth every morning.     Marland Kitchen OVER THE COUNTER MEDICATION Take 1 tablet by mouth daily.    . sildenafil (REVATIO) 20 MG tablet Take 60 mg by mouth daily as needed (erectile dusfunction).     Marland Kitchen testosterone cypionate (DEPOTESTOSTERONE CYPIONATE) 200 MG/ML injection Inject 1.5 mLs (300 mg total) into the muscle every 14 (fourteen) days. 10 mL 1  . warfarin (COUMADIN) 2 MG tablet Take as directed by  anticoagulation clinic 150 tablet 0  . benazepril (LOTENSIN) 10 MG tablet Take 1 tablet (10 mg total) by mouth daily. 90 tablet 1   No current facility-administered medications for this visit.    No Known Allergies  Social History   Social History  . Marital Status: Married    Spouse Name: Aksel Francisco  . Number of Children: 3  . Years of Education: 16   Occupational History  . RETIRED BANKER     Still active w/stocks  . RETIRED    Social History Main Topics  . Smoking status: Former Smoker -- 1.00 packs/day for 12 years    Types: Cigarettes    Quit date: 05/04/1963  . Smokeless tobacco: Never Used  . Alcohol Use: Yes     Comment: 06/21/2013 "I was a moderate drinker at most; rarely have drink now"  . Drug Use: No  . Sexual Activity:    Partners: Female   Other Topics Concern  . Not on file   Social History Narrative   Salome [place], [place],  MBA, Chiropodist. Married 1958. 2 -girls '59, '62; 1 son '64; 2 grandchildren. Retired Customer service manager. END of LIFE CARE: Yes - DNR, yes - for short term mechanical ventilation.has a living will, would not want any heroic/futile care.  Wife sustained fracture proximal humerus left - Sept '11     Review of  Systems: General: negative for chills, fever, night sweats or weight changes.  Cardiovascular: negative for chest pain, dyspnea on exertion, edema, orthopnea, palpitations, paroxysmal nocturnal dyspnea or shortness of breath Dermatological: negative for rash Respiratory: negative for cough or wheezing Urologic: negative for hematuria Abdominal: negative for nausea, vomiting, diarrhea, bright red blood per rectum, melena, or hematemesis Neurologic: negative for visual changes, syncope, or dizziness All other systems reviewed and are otherwise negative except as noted above.    Blood pressure 132/98, pulse 119, height 5\' 9"  (1.753 m), weight 170 lb 12.8 oz (77.474 kg).  General appearance: alert, cooperative and no  distress Neck: no carotid bruit and no JVD Lungs: clear to auscultation bilaterally Heart: regular rate and rhythm  EKG A flutter 2:1   ASSESSMENT AND PLAN:   Atrial flutter (HCC) Recurrent atrial flutter- he is asymptomatic  PAF (paroxysmal atrial fibrillation) (HCC) S/P Ablation procedure at [facility] in 2007 Multiple cardioversions, baseline sinus bradycardia limits Rx  NICM (nonischemic cardiomyopathy) (HCC) EF as low as 20% in the past when in AF- NL LVF by Myoview Aug 2016  Chronic anticoagulation Coumadin Rx  CKD (chronic kidney disease) stage 3, GFR 30-59 ml/min Last SCr 1.9   PLAN Discussed with Dr Harrington Challenger. She suggested increasing his Toprol to 37.5 mg BID, stopping his Lotrel, starting benazepril 10 mg.  F/U AF clinic.   Kerin Ransom K PA-C 07/04/2015 2:48 PM

## 2015-07-04 NOTE — Assessment & Plan Note (Signed)
Recurrent atrial flutter- he is asymptomatic

## 2015-07-04 NOTE — Assessment & Plan Note (Deleted)
Recurrent atrial flutter- he is asymptomatic

## 2015-07-10 ENCOUNTER — Inpatient Hospital Stay (HOSPITAL_COMMUNITY): Admission: RE | Admit: 2015-07-10 | Payer: Medicare Other | Source: Ambulatory Visit | Admitting: Nurse Practitioner

## 2015-07-11 ENCOUNTER — Other Ambulatory Visit (HOSPITAL_COMMUNITY): Payer: Self-pay | Admitting: *Deleted

## 2015-07-11 ENCOUNTER — Encounter (HOSPITAL_COMMUNITY): Payer: Self-pay | Admitting: Nurse Practitioner

## 2015-07-11 ENCOUNTER — Ambulatory Visit (HOSPITAL_COMMUNITY)
Admission: RE | Admit: 2015-07-11 | Discharge: 2015-07-11 | Disposition: A | Discharge status: Home / Self Care | Payer: Medicare Other | Source: Ambulatory Visit | Attending: Nurse Practitioner | Admitting: Nurse Practitioner

## 2015-07-11 ENCOUNTER — Ambulatory Visit (INDEPENDENT_AMBULATORY_CARE_PROVIDER_SITE_OTHER): Payer: Medicare Other | Admitting: Cardiovascular Disease

## 2015-07-11 ENCOUNTER — Telehealth (HOSPITAL_COMMUNITY): Payer: Self-pay | Admitting: *Deleted

## 2015-07-11 VITALS — BP 138/80 | HR 123 | Ht 69.0 in | Wt 171.0 lb

## 2015-07-11 DIAGNOSIS — D751 Secondary polycythemia: Secondary | ICD-10-CM | POA: Insufficient documentation

## 2015-07-11 DIAGNOSIS — Z833 Family history of diabetes mellitus: Secondary | ICD-10-CM | POA: Diagnosis not present

## 2015-07-11 DIAGNOSIS — Z7901 Long term (current) use of anticoagulants: Secondary | ICD-10-CM | POA: Insufficient documentation

## 2015-07-11 DIAGNOSIS — Z5181 Encounter for therapeutic drug level monitoring: Secondary | ICD-10-CM

## 2015-07-11 DIAGNOSIS — I129 Hypertensive chronic kidney disease with stage 1 through stage 4 chronic kidney disease, or unspecified chronic kidney disease: Secondary | ICD-10-CM | POA: Diagnosis not present

## 2015-07-11 DIAGNOSIS — Z825 Family history of asthma and other chronic lower respiratory diseases: Secondary | ICD-10-CM | POA: Diagnosis not present

## 2015-07-11 DIAGNOSIS — Z87891 Personal history of nicotine dependence: Secondary | ICD-10-CM | POA: Diagnosis not present

## 2015-07-11 DIAGNOSIS — M353 Polymyalgia rheumatica: Secondary | ICD-10-CM | POA: Diagnosis not present

## 2015-07-11 DIAGNOSIS — N189 Chronic kidney disease, unspecified: Secondary | ICD-10-CM | POA: Diagnosis not present

## 2015-07-11 DIAGNOSIS — E039 Hypothyroidism, unspecified: Secondary | ICD-10-CM | POA: Insufficient documentation

## 2015-07-11 DIAGNOSIS — I480 Paroxysmal atrial fibrillation: Secondary | ICD-10-CM

## 2015-07-11 DIAGNOSIS — I4892 Unspecified atrial flutter: Secondary | ICD-10-CM | POA: Diagnosis not present

## 2015-07-11 DIAGNOSIS — Z79899 Other long term (current) drug therapy: Secondary | ICD-10-CM | POA: Diagnosis not present

## 2015-07-11 DIAGNOSIS — Z841 Family history of disorders of kidney and ureter: Secondary | ICD-10-CM | POA: Insufficient documentation

## 2015-07-11 DIAGNOSIS — E785 Hyperlipidemia, unspecified: Secondary | ICD-10-CM | POA: Insufficient documentation

## 2015-07-11 DIAGNOSIS — I4891 Unspecified atrial fibrillation: Secondary | ICD-10-CM | POA: Diagnosis present

## 2015-07-11 LAB — CBC
HCT: 57.1 % — ABNORMAL HIGH (ref 39.0–52.0)
Hemoglobin: 19.2 g/dL — ABNORMAL HIGH (ref 13.0–17.0)
MCH: 31.8 pg (ref 26.0–34.0)
MCHC: 33.6 g/dL (ref 30.0–36.0)
MCV: 94.5 fL (ref 78.0–100.0)
PLATELETS: 171 10*3/uL (ref 150–400)
RBC: 6.04 MIL/uL — AB (ref 4.22–5.81)
RDW: 13.8 % (ref 11.5–15.5)
WBC: 7.4 10*3/uL (ref 4.0–10.5)

## 2015-07-11 LAB — BASIC METABOLIC PANEL
ANION GAP: 7 (ref 5–15)
BUN: 31 mg/dL — ABNORMAL HIGH (ref 6–20)
CALCIUM: 9.4 mg/dL (ref 8.9–10.3)
CO2: 28 mmol/L (ref 22–32)
Chloride: 105 mmol/L (ref 101–111)
Creatinine, Ser: 1.83 mg/dL — ABNORMAL HIGH (ref 0.61–1.24)
GFR, EST AFRICAN AMERICAN: 37 mL/min — AB (ref >60)
GFR, EST NON AFRICAN AMERICAN: 32 mL/min — AB (ref >60)
Glucose, Bld: 69 mg/dL (ref 65–99)
Potassium: 5.6 mmol/L — ABNORMAL HIGH (ref 3.5–5.1)
SODIUM: 140 mmol/L (ref 135–145)

## 2015-07-11 LAB — PROTIME-INR
INR: 2.07 — AB (ref 0.00–1.49)
INR: 1.98 — AB (ref 0.00–1.49)
PROTHROMBIN TIME: 23.2 s — AB (ref 11.6–15.2)
PROTHROMBIN TIME: 22.4 s — AB (ref 11.6–15.2)

## 2015-07-11 MED ORDER — AMLODIPINE BESYLATE 5 MG PO TABS: 5.0000 mg | ORAL_TABLET | Freq: Every day | ORAL | Status: DC

## 2015-07-11 NOTE — Patient Instructions (Signed)
Your physician has recommended you make the following change in your medication:  1)Stop Benazepril  2)Start Amlodipine 5mg  once a day 3)Day of cardioversion  --- Go back to your normal dose of metoprolol 25mg  daily

## 2015-07-11 NOTE — Patient Instructions (Addendum)
Cardioversion scheduled for Monday, March 13th  - Arrive at the [facility] and go to admitting at 12PM  -Do not eat or drink anything after midnight the night prior to your procedure.  - Take all your medication with a sip of water prior to arrival.  - You will not be able to drive home after your procedure.    Your physician has recommended you make the following change in your medication:  1)Stop Benazepril  2)Start Amlodipine (Norvasc) 5mg  once a day

## 2015-07-11 NOTE — Telephone Encounter (Signed)
Spoke with patient and advised of INR results.  Patient was given instructions to take 2 tablets of Coumadin tonight then resume normal dose of coumadin (please refer to anticoagulation encounter).  Also, an appt was made over the phone with the patient for 07/14/15 prior to DCCV as requested by Stacy at [facility].

## 2015-07-11 NOTE — Telephone Encounter (Signed)
Patient scheduled for DCCV on 3/13 @ 1pm with Dr. Radford Pax. INR on redraw 1.98. Informed coumadin clinic so that patient could be corrected on his INR. Will add TEE to cardioversion. Coumadin clinic to be in touch with patient regarding coumadin dosing for weekend as well as reck of INR prior to cardioversion on Monday (pt to arrive at hospital at 12pm). Spoke with Candace in coumadin clinic whom requested I forward message to coumadin clinic to be followed up on.

## 2015-07-11 NOTE — Progress Notes (Signed)
Patient ID: [name], male   DOB: 1926-04-16, 82 y.o.   MRN: [id number]     Primary Care Physician: [name], MD Referring Physician: Dr. Loistine Simas, PA   [name] is a 82 y.o. male with a h/o atrial fib with h/o ablation at [facility] in 2007. He has had recurrent a flutter with last cardioversion 7/16. He has brady at baseline and at one time was tried on amiodarone but could not tolerate due to bradycardia.He was recently seen by the urologist and noted to have an elevated heart rate. He was seen by Kerin Ransom, PA and Dr. Harrington Challenger and found to be in a flutter with rvr from which he was asymptomatic. Metoprolol was Increased. Lotrel was stopped (amlodipine) but continued with benazapril 10 mg qd.  Today, he denies symptoms of palpitations, chest pain, shortness of breath, orthopnea, PND, lower extremity edema, dizziness, presyncope, syncope, or neurologic sequela. Has noted some mild fatigue.The patient is tolerating medications without difficulties and is otherwise without complaint today.   Past Medical History  Diagnosis Date  . AF (atrial fibrillation) [facility])     a. s/p RFCA/PVI @ [facility] clinic 2007.  [name] Neoplasm of uncertain behavior     SKIN  . Hyperlipidemia   . Hypothyroidism   . HYPERTENSION, BENIGN 08/21/2009    Qualifier: Diagnosis of  By: Caryl Comes, MD, Remus Blake  Medications(s)  enalapril   . Polymyalgia rheumatica ([place]) 01/13/2007    Qualifier: History of  By: Danny Lawless CMA, [place]     . Arthritis     "joints" (06/21/2013)  . Atrial flutter ([place])     a. 06/2013 s/p TEE/DCCV  . Chronic systolic CHF (congestive heart failure) ([place])     a. 06/2013 Echo: EF 20%, diff HK, mild LVH.  [name] History of cardiovascular stress test     Lexiscan Myoview 8/16:  EF 59%, attenuation artifact, no ischemia; Low Risk   Past Surgical History  Procedure Laterality Date  . Hand surgery Right     "broke bone; grafted area w/bone from somewhere else"  .  Cataract extraction, bilateral Bilateral Summer 2009  . Tonsillectomy    . Cholecystectomy    . Inguinal hernia repair Bilateral   . Atrial fibrillation ablation  2007  . Hemorrhoid surgery    . Tee without cardioversion N/A 06/22/2013    Procedure: TRANSESOPHAGEAL ECHOCARDIOGRAM (TEE);  Surgeon: Sueanne Margarita, MD;  Location: [facility];  Service: Cardiovascular;  Laterality: N/A;  . Cardioversion N/A 06/22/2013    Procedure: CARDIOVERSION;  Surgeon: Sueanne Margarita, MD;  Location: [facility] ENDOSCOPY;  Service: Cardiovascular;  Laterality: N/A;  15:17 Lido 20mg ,IV , Propofol 30 mg, IV synched cardioversion @ 150 joules by Dr. Lajoyce Lauber from a flutter to sinis brady which is baseline for this pt, reports 45-50 reg HR verified by Dr. Ashok Norris  . Cardioversion N/A 11/21/2014    Procedure: CARDIOVERSION;  Surgeon: Fay Records, MD;  Location: [facility] ENDOSCOPY;  Service: Cardiovascular;  Laterality: N/A;    Current Outpatient Prescriptions  Medication Sig Dispense Refill  . atorvastatin (LIPITOR) 10 MG tablet Take 1 tablet (10 mg total) by mouth daily. 90 tablet 1  . benazepril (LOTENSIN) 10 MG tablet Take 1 tablet (10 mg total) by mouth daily. 90 tablet 1  . carboxymethylcellulose (REFRESH TEARS) 0.5 % SOLN Place 1 drop into both eyes 3 (three) times daily as needed (dry eyes).     . cholecalciferol (VITAMIN D) 1000 UNITS tablet Take 1,000  Units by mouth every morning.    . Glucosamine-Chondroitin 500-400 MG CAPS Take 1 capsule by mouth every morning.    . hydrochlorothiazide (HYDRODIURIL) 25 MG tablet Take 1 tablet (25 mg total) by mouth daily. 90 tablet 2  . levothyroxine (SYNTHROID, LEVOTHROID) 50 MCG tablet Take 1 tablet (50 mcg total) by mouth daily. 90 tablet 2  . metoprolol succinate (TOPROL-XL) 25 MG 24 hr tablet Take 1.5 tablets (37.5 mg total) by mouth 2 (two) times daily. 180 tablet 1  . Multiple Vitamins-Minerals (CENTRUM SILVER PO) Take 1 tablet by mouth every morning.     [name] OVER THE COUNTER  MEDICATION Take 1 tablet by mouth daily.    . sildenafil (REVATIO) 20 MG tablet Take 60 mg by mouth daily as needed (erectile dusfunction).     [name] testosterone cypionate (DEPOTESTOSTERONE CYPIONATE) 200 MG/ML injection Inject 1.5 mLs (300 mg total) into the muscle every 14 (fourteen) days. 10 mL 1  . warfarin (COUMADIN) 2 MG tablet Take as directed by anticoagulation clinic 150 tablet 0   No current facility-administered medications for this encounter.    No Known Allergies  Social History   Social History  . Marital Status: Married    Spouse Name: Erico Dente  . Number of Children: 3  . Years of Education: 16   Occupational History  . RETIRED BANKER     Still active w/stocks  . RETIRED    Social History Main Topics  . Smoking status: Former Smoker -- 1.00 packs/day for 12 years    Types: Cigarettes    Quit date: 05/04/1963  . Smokeless tobacco: Never Used  . Alcohol Use: Yes     Comment: 06/21/2013 "I was a moderate drinker at most; rarely have drink now"  . Drug Use: No  . Sexual Activity:    Partners: Female   Other Topics Concern  . Not on file   Social History Narrative   [name], [place],  MBA, Chiropodist. Married 1958. 2 -girls '59, '62; 1 son '64; 2 grandchildren. Retired Customer service manager. END of LIFE CARE: Yes - DNR, yes - for short term mechanical ventilation.has a living will, would not want any heroic/futile care.  Wife sustained fracture proximal humerus left - Sept '11    Family History  Problem Relation Age of Onset  . Emphysema Mother   . Diabetes Mother   . Kidney disease Father     ROS- All systems are reviewed and negative except as per the HPI above  Physical Exam: Filed Vitals:   07/11/15 1038  BP: 138/80  Pulse: 123  Height: 5\' 9"  (1.753 m)  Weight: 171 lb (77.565 kg)    GEN- The patient is well appearing, alert and oriented x 3 today.   Head- normocephalic, atraumatic Eyes-  Sclera clear, conjunctiva pink Ears- hearing  intact Oropharynx- clear Neck- supple, no JVP Lymph- no cervical lymphadenopathy Lungs- Clear to ausculation bilaterally, normal work of breathing Heart- Rapid, regular,rate and rhythm, no murmurs, rubs or gallops, PMI not laterally displaced GI- soft, NT, ND, + BS Extremities- no clubbing, cyanosis, or edema MS- no significant deformity or atrophy Skin- no rash or lesion Psych- euthymic mood, full affect Neuro- strength and sensation are intact  EKG- A flutter at 123 bpm, pr int 162 ms, qrs int 92 ms, qtc 443ms Epic records reviewed  Assessment and Plan: 1. A flutter No significant change in v rate with increase in metoprolol Pt had successful cardioversion 2 years ago with last  breakthrough of atrial flutter, did not tolerate amiodarone in the past due to brady, not a tikosyn candidate due to renal function Will schedule cardioversion on Monday, no TEE needed with therapeutic INR's, no missed coumadin Last INR's therapeutic at 3/10- 2.07, 2.3- 2/28, 2.6- 05/20/15, 2.2 - 12/20, 2.2- 11/21, 3.6-11/2, 3.7-10/12/16 Lab has requested with increased hematocrit that could affect results of INR, to be redrawn this pm in an anticoagulant adjusted tube and pt plans to return this afternoon around 3pm. If repeat INR is subtherapeutic will add TEE.  2. HTN/CKD K+ is increased, amlodipine recently stopped as part of Lotrel and continued with benazapril Will stop benazapril and add amlodipine 5 mg qd Istat to be repeated am of cardioversion  3. Polycythemia PharmD resident was working with me this am in clinic and was concerned with pt taking testosterone and increase in hematocrit. She sent his PCP a staff message re stopping testosterone with lab findings.  F/u in afib clinic in one week  Butch Penny C. Ivie Maese, [facility] [address] [place], [place] [address] [phone]

## 2015-07-12 ENCOUNTER — Other Ambulatory Visit: Payer: Self-pay | Admitting: Internal Medicine

## 2015-07-14 ENCOUNTER — Ambulatory Visit (HOSPITAL_COMMUNITY)
Admission: RE | Admit: 2015-07-14 | Discharge: 2015-07-14 | Disposition: A | Discharge status: Home / Self Care | Payer: Medicare Other | Source: Ambulatory Visit | Attending: Cardiology | Admitting: Cardiology

## 2015-07-14 ENCOUNTER — Ambulatory Visit (HOSPITAL_COMMUNITY): Payer: Medicare Other | Admitting: Certified Registered"

## 2015-07-14 ENCOUNTER — Other Ambulatory Visit: Payer: Self-pay | Admitting: *Deleted

## 2015-07-14 ENCOUNTER — Ambulatory Visit (INDEPENDENT_AMBULATORY_CARE_PROVIDER_SITE_OTHER): Payer: Medicare Other | Admitting: Pharmacist

## 2015-07-14 ENCOUNTER — Ambulatory Visit (HOSPITAL_BASED_OUTPATIENT_CLINIC_OR_DEPARTMENT_OTHER)
Admission: RE | Admit: 2015-07-14 | Discharge: 2015-07-14 | Disposition: A | Discharge status: Home / Self Care | Payer: Medicare Other | Source: Ambulatory Visit | Attending: Nurse Practitioner | Admitting: Nurse Practitioner

## 2015-07-14 ENCOUNTER — Encounter (HOSPITAL_COMMUNITY): Payer: Self-pay

## 2015-07-14 ENCOUNTER — Encounter (HOSPITAL_COMMUNITY)
Admission: RE | Disposition: A | Discharge status: Home / Self Care | Payer: Self-pay | Source: Ambulatory Visit | Attending: Cardiology

## 2015-07-14 DIAGNOSIS — Z87891 Personal history of nicotine dependence: Secondary | ICD-10-CM | POA: Insufficient documentation

## 2015-07-14 DIAGNOSIS — E785 Hyperlipidemia, unspecified: Secondary | ICD-10-CM | POA: Diagnosis not present

## 2015-07-14 DIAGNOSIS — Z5181 Encounter for therapeutic drug level monitoring: Secondary | ICD-10-CM | POA: Diagnosis not present

## 2015-07-14 DIAGNOSIS — I509 Heart failure, unspecified: Secondary | ICD-10-CM | POA: Diagnosis not present

## 2015-07-14 DIAGNOSIS — I5022 Chronic systolic (congestive) heart failure: Secondary | ICD-10-CM | POA: Diagnosis not present

## 2015-07-14 DIAGNOSIS — I4891 Unspecified atrial fibrillation: Secondary | ICD-10-CM | POA: Diagnosis not present

## 2015-07-14 DIAGNOSIS — R001 Bradycardia, unspecified: Secondary | ICD-10-CM | POA: Diagnosis not present

## 2015-07-14 DIAGNOSIS — I4892 Unspecified atrial flutter: Secondary | ICD-10-CM

## 2015-07-14 DIAGNOSIS — I484 Atypical atrial flutter: Secondary | ICD-10-CM

## 2015-07-14 DIAGNOSIS — I480 Paroxysmal atrial fibrillation: Secondary | ICD-10-CM

## 2015-07-14 DIAGNOSIS — E039 Hypothyroidism, unspecified: Secondary | ICD-10-CM | POA: Insufficient documentation

## 2015-07-14 DIAGNOSIS — I129 Hypertensive chronic kidney disease with stage 1 through stage 4 chronic kidney disease, or unspecified chronic kidney disease: Secondary | ICD-10-CM | POA: Diagnosis not present

## 2015-07-14 DIAGNOSIS — N189 Chronic kidney disease, unspecified: Secondary | ICD-10-CM | POA: Diagnosis not present

## 2015-07-14 DIAGNOSIS — Z7901 Long term (current) use of anticoagulants: Secondary | ICD-10-CM | POA: Insufficient documentation

## 2015-07-14 HISTORY — PX: CARDIOVERSION: SHX1299

## 2015-07-14 HISTORY — PX: TEE WITHOUT CARDIOVERSION: SHX5443

## 2015-07-14 LAB — POCT INR: INR: 2

## 2015-07-14 SURGERY — ECHOCARDIOGRAM, TRANSESOPHAGEAL
Anesthesia: Monitor Anesthesia Care

## 2015-07-14 MED ORDER — PERFLUTREN LIPID MICROSPHERE
INTRAVENOUS | Status: AC
  Filled 2015-07-14: qty 10

## 2015-07-14 MED ORDER — BUTAMBEN-TETRACAINE-BENZOCAINE 2-2-14 % EX AERO
INHALATION_SPRAY | CUTANEOUS | Status: DC | PRN
  Administered 2015-07-14: 2 via TOPICAL

## 2015-07-14 MED ORDER — LIDOCAINE HCL (CARDIAC) 20 MG/ML IV SOLN
INTRAVENOUS | Status: DC | PRN
  Administered 2015-07-14: 100 mg via INTRATRACHEAL

## 2015-07-14 MED ORDER — PHENYLEPHRINE HCL 10 MG/ML IJ SOLN
INTRAMUSCULAR | Status: DC | PRN
  Administered 2015-07-14: 120 ug via INTRAVENOUS
  Administered 2015-07-14: 80 ug via INTRAVENOUS
  Administered 2015-07-14: 40 ug via INTRAVENOUS

## 2015-07-14 MED ORDER — LACTATED RINGERS IV SOLN
INTRAVENOUS | Status: DC | PRN
  Administered 2015-07-14: 13:00:00 via INTRAVENOUS

## 2015-07-14 MED ORDER — ATORVASTATIN CALCIUM 10 MG PO TABS: 10.0000 mg | ORAL_TABLET | Freq: Every day | ORAL | Status: DC

## 2015-07-14 MED ORDER — METOPROLOL SUCCINATE ER 25 MG PO TB24: 12.5000 mg | ORAL_TABLET | Freq: Every day | ORAL | Status: DC

## 2015-07-14 MED ORDER — PROPOFOL 10 MG/ML IV BOLUS
INTRAVENOUS | Status: DC | PRN
  Administered 2015-07-14: 50 mg via INTRAVENOUS

## 2015-07-14 MED ORDER — METOPROLOL SUCCINATE ER 25 MG PO TB24: ORAL_TABLET | ORAL | Status: DC

## 2015-07-14 MED ORDER — SODIUM CHLORIDE 0.9 % IV SOLN: INTRAVENOUS | Status: DC

## 2015-07-14 MED ORDER — PROPOFOL 500 MG/50ML IV EMUL
INTRAVENOUS | Status: DC | PRN
  Administered 2015-07-14: 75 ug/kg/min via INTRAVENOUS

## 2015-07-14 NOTE — Interval H&P Note (Signed)
History and Physical Interval Note:  07/14/2015 12:36 PM  [name]  has presented today for surgery, with the diagnosis of AFIB  The various methods of treatment have been discussed with the patient and family. After consideration of risks, benefits and other options for treatment, the patient has consented to  Procedure(s): TRANSESOPHAGEAL ECHOCARDIOGRAM (TEE) (N/A) CARDIOVERSION (N/A) as a surgical intervention .  The patient's history has been reviewed, patient examined, no change in status, stable for surgery.  I have reviewed the patient's chart and labs.  Questions were answered to the patient's satisfaction.     Fowler Antos R

## 2015-07-14 NOTE — CV Procedure (Signed)
    PROCEDURE NOTE:  Procedure:  Transesophageal echocardiogram Operator:  Gabriel Him, MD Indications:  Atrial flutter with RVR Complications: None  During this procedure the patient is administered a total of Propafol 160mg (for total procedure TEE and DCCV) to achieve and maintain deep conscious sedation.  The patient's heart rate, blood pressure, and oxygen saturation are monitored continuously during the procedure. The period of conscious sedation is 15 minutes, of which I was present face-to-face 100% of this time.  Results: Dilated LV with severe LV dysfunction Mildly dilated RV with severe RV dysfunction Moderately dilated RA with spontaneous echo contrast c/w sluggish flow Severely dilated LA with spontaneous echo contrast.  The LA appendage had spontaneous echo contrast at the mouth of the appendage but no evidence of organized thrombus.  The emptying velocity was mildly reduced at 39cm/s. Normal TV with mild TR Normal PV Normal MV with mild MR Normal trileaflet AV with trivial AR Normal interatrial septum with no evidence of shunt by colorflow dopper  Moderate atherosclerosis of the thoracic and ascending aorta.  The patient tolerated the procedure well and went on to DCCV.  Signed: Fransico Him, MD [facility]    Electrical Cardioversion Procedure Note [name] [place]:[id number] Sep 02, 1925  Procedure: Electrical Cardioversion Indications:  Atrial Flutter  Time Out: Verified patient identification, verified procedure,medications/allergies/relevent history reviewed, required imaging and test results available.  Performed  Procedure Details  The patient was NPO after midnight. Anesthesia was administered at the beside  by Dr.Fitzgerald with 160mg  of propofol (total amount for entire procedure).  Cardioversion was done with synchronized biphasic defibrillation with AP pads with 150watts.  The patient converted to normal sinus rhythm. The patient tolerated the  procedure well   IMPRESSION:  Successful cardioversion of atrial flutter with RVR to sinus bradycardia with HR in the mid 40's.    [name] 07/14/2015, 1:34 PM

## 2015-07-14 NOTE — Anesthesia Preprocedure Evaluation (Addendum)
Anesthesia Evaluation  Patient identified by MRN, date of birth, ID band Patient awake    Reviewed: Allergy & Precautions, H&P , NPO status , Patient's Chart, lab work & pertinent test results, reviewed documented beta blocker date and time   Airway Mallampati: III  TM Distance: >3 FB Neck ROM: Full    Dental no notable dental hx. (+) Teeth Intact, Dental Advisory Given   Pulmonary neg pulmonary ROS, former smoker,    Pulmonary exam normal breath sounds clear to auscultation       Cardiovascular hypertension, Pt. on medications and Pt. on home beta blockers +CHF  + dysrhythmias Atrial Fibrillation  Rhythm:Irregular Rate:Tachycardia     Neuro/Psych negative neurological ROS  negative psych ROS   GI/Hepatic negative GI ROS, Neg liver ROS,   Endo/Other  Hypothyroidism   Renal/GU Renal disease  negative genitourinary   Musculoskeletal  (+) Arthritis , Osteoarthritis,    Abdominal   Peds  Hematology negative hematology ROS (+)   Anesthesia Other Findings   Reproductive/Obstetrics negative OB ROS                            Anesthesia Physical Anesthesia Plan  ASA: III  Anesthesia Plan: MAC   Post-op Pain Management:    Induction: Intravenous  Airway Management Planned: Nasal Cannula  Additional Equipment:   Intra-op Plan:   Post-operative Plan:   Informed Consent: I have reviewed the patients History and Physical, chart, labs and discussed the procedure including the risks, benefits and alternatives for the proposed anesthesia with the patient or authorized representative who has indicated his/her understanding and acceptance.   Dental advisory given  Plan Discussed with: CRNA  Anesthesia Plan Comments:         Anesthesia Quick Evaluation

## 2015-07-14 NOTE — Anesthesia Postprocedure Evaluation (Signed)
Anesthesia Post Note  Patient: [name]  Procedure(s) Performed: Procedure(s) (LRB): TRANSESOPHAGEAL ECHOCARDIOGRAM (TEE) (N/A) CARDIOVERSION (N/A)  Patient location during evaluation: PACU Anesthesia Type: MAC Level of consciousness: awake and alert Pain management: pain level controlled Vital Signs Assessment: post-procedure vital signs reviewed and stable Respiratory status: spontaneous breathing, nonlabored ventilation, respiratory function stable and patient connected to nasal cannula oxygen Cardiovascular status: stable and blood pressure returned to baseline Anesthetic complications: no    Last Vitals:  Filed Vitals:   07/14/15 1350 07/14/15 1400  BP: 135/90   Pulse: 78 73  Temp:    Resp: 16 16    Last Pain: There were no vitals filed for this visit.               Pia Jedlicka,W. EDMOND

## 2015-07-14 NOTE — H&P (View-Only) (Signed)
Patient ID: [name], male   DOB: Jun 21, 1925, 82 y.o.   MRN: [id number]     Primary Care Physician: [name] Referring Physician: Dr. Loistine Simas, M[facility]   [name] is a 82 y.o. male with a h/o atrial fib with h/o ablation at [name] in 2007. He has had recurrent a flutter with last cardioversion 7/16. He has brady at baseline and at one time was tried on amiodarone but could not tolerate due to bradycardia.He was recently seen by the urologist and noted to have an elevated heart rate. He was seen by Gabriel Ransom, Mccoy and Dr. Harrington Mccoy and found to be in a flutter with rvr from which he was asymptomatic. Metoprolol was Increased. Lotrel was stopped (amlodipine) but continued with benazapril 10 mg qd.  Today, he denies symptoms of palpitations, chest pain, shortness of breath, orthopnea, PND, lower extremity edema, dizziness, presyncope, syncope, or neurologic sequela. Has noted some mild fatigue.The patient is tolerating medications without difficulties and is otherwise without complaint today.   Past Medical History  Diagnosis Date  . AF (atrial fibrillation) [name])     a. s/p RFCA/PVI @ [facility] clinic 2007.  Marland Kitchen Neoplasm of uncertain behavior     SKIN  . Hyperlipidemia   . Hypothyroidism   . HYPERTENSION, BENIGN 08/21/2009    Qualifier: Diagnosis of  By: Gabriel Comes, Mccoy, [name]  Medications(s)  enalapril   . Polymyalgia rheumatica ([place]) 01/13/2007    Qualifier: History of  By: [name], [place]     . Arthritis     "joints" (06/21/2013)  . Atrial flutter ([place])     a. 06/2013 s/p TEE/DCCV  . Chronic systolic CHF (congestive heart failure) ([place])     a. 06/2013 Echo: EF 20%, diff HK, mild LVH.  Marland Kitchen History of cardiovascular stress test     Lexiscan Myoview 8/16:  EF 59%, attenuation artifact, no ischemia; Low Risk   Past Surgical History  Procedure Laterality Date  . Hand surgery Right     "broke bone; grafted area w/bone from somewhere else"  .  Cataract extraction, bilateral Bilateral Summer 2009  . Tonsillectomy    . Cholecystectomy    . Inguinal hernia repair Bilateral   . Atrial fibrillation ablation  2007  . Hemorrhoid surgery    . Tee without cardioversion N/A 06/22/2013    Procedure: TRANSESOPHAGEAL ECHOCARDIOGRAM (TEE);  Surgeon: Gabriel Margarita, Mccoy;  Location: [name];  Service: Cardiovascular;  Laterality: N/A;  . Cardioversion N/A 06/22/2013    Procedure: CARDIOVERSION;  Surgeon: Gabriel Margarita, Mccoy;  Location: [name];  Service: Cardiovascular;  Laterality: N/A;  15:17 Lido 20mg ,IV , Propofol 30 mg, IV synched cardioversion @ 150 joules by Dr. Lajoyce Mccoy from a flutter to sinis brady which is baseline for this pt, reports 45-50 reg HR verified by Dr. Ashok Mccoy  . Cardioversion N/A 11/21/2014    Procedure: CARDIOVERSION;  Surgeon: Gabriel Records, Mccoy;  Location: [facility];  Service: Cardiovascular;  Laterality: N/A;    Current Outpatient Prescriptions  Medication Sig Dispense Refill  . atorvastatin (LIPITOR) 10 MG tablet Take 1 tablet (10 mg total) by mouth daily. 90 tablet 1  . benazepril (LOTENSIN) 10 MG tablet Take 1 tablet (10 mg total) by mouth daily. 90 tablet 1  . carboxymethylcellulose (REFRESH TEARS) 0.5 % SOLN Place 1 drop into both eyes 3 (three) times daily as needed (dry eyes).     . cholecalciferol (VITAMIN D) 1000 UNITS tablet Take 1,000  Units by mouth every morning.    . Glucosamine-Chondroitin 500-400 MG CAPS Take 1 capsule by mouth every morning.    . hydrochlorothiazide (HYDRODIURIL) 25 MG tablet Take 1 tablet (25 mg total) by mouth daily. 90 tablet 2  . levothyroxine (SYNTHROID, LEVOTHROID) 50 MCG tablet Take 1 tablet (50 mcg total) by mouth daily. 90 tablet 2  . metoprolol succinate (TOPROL-XL) 25 MG 24 hr tablet Take 1.5 tablets (37.5 mg total) by mouth 2 (two) times daily. 180 tablet 1  . Multiple Vitamins-Minerals (CENTRUM SILVER PO) Take 1 tablet by mouth every morning.     Marland Kitchen OVER THE COUNTER  MEDICATION Take 1 tablet by mouth daily.    . sildenafil (REVATIO) 20 MG tablet Take 60 mg by mouth daily as needed (erectile dusfunction).     Marland Kitchen testosterone cypionate (DEPOTESTOSTERONE CYPIONATE) 200 MG/ML injection Inject 1.5 mLs (300 mg total) into the muscle every 14 (fourteen) days. 10 mL 1  . warfarin (COUMADIN) 2 MG tablet Take as directed by anticoagulation clinic 150 tablet 0   No current facility-administered medications for this encounter.    No Known Allergies  Social History   Social History  . Marital Status: Married    Spouse Name: [name]  . Number of Children: 3  . Years of Education: 16   Occupational History  . RETIRED BANKER     Still active w/stocks  . RETIRED    Social History Main Topics  . Smoking status: Former Smoker -- 1.00 packs/day for 12 years    Types: Cigarettes    Quit date: 05/04/1963  . Smokeless tobacco: Never Used  . Alcohol Use: Yes     Comment: 06/21/2013 "I was a moderate drinker at most; rarely have drink now"  . Drug Use: No  . Sexual Activity:    Partners: Female   Other Topics Concern  . Not on file   Social History Narrative   [name], [place],  MBA, Chiropodist. Married 1958. 2 -girls '59, '62; 1 son '64; 2 grandchildren. Retired Customer service manager. END of LIFE CARE: Yes - DNR, yes - for short term mechanical ventilation.has a living will, would not want any heroic/futile care.  Wife sustained fracture proximal humerus left - Sept '11    Family History  Problem Relation Age of Onset  . Emphysema Mother   . Diabetes Mother   . Kidney disease Father     ROS- All systems are reviewed and negative except as per the HPI above  Physical Exam: Filed Vitals:   07/11/15 1038  BP: 138/80  Pulse: 123  Height: 5\' 9"  (1.753 m)  Weight: 171 lb (77.565 kg)    GEN- The patient is well appearing, alert and oriented x 3 today.   Head- normocephalic, atraumatic Eyes-  Sclera clear, conjunctiva pink Ears- hearing  intact Oropharynx- clear Neck- supple, no JVP Lymph- no cervical lymphadenopathy Lungs- Clear to ausculation bilaterally, normal work of breathing Heart- Rapid, regular,rate and rhythm, no murmurs, rubs or gallops, PMI not laterally displaced GI- soft, NT, ND, + BS Extremities- no clubbing, cyanosis, or edema MS- no significant deformity or atrophy Skin- no rash or lesion Psych- euthymic mood, full affect Neuro- strength and sensation are intact  EKG- A flutter at 123 bpm, pr int 162 ms, qrs int 92 ms, qtc 432ms Epic [facility] reviewed  Assessment and Plan: 1. A flutter No significant change in v rate with increase in metoprolol Pt had successful cardioversion 2 years ago with last  breakthrough of atrial flutter, did not tolerate amiodarone in the past due to brady, not a tikosyn candidate due to renal function Will schedule cardioversion on Monday, no TEE needed with therapeutic INR's, no missed coumadin Last INR's therapeutic at 3/10- 2.07, 2.3- 2/28, 2.6- 05/20/15, 2.2 - 12/20, 2.2- 11/21, 3.6-11/2, 3.7-10/12/16 Lab has requested with increased hematocrit that could affect results of INR, to be redrawn this pm in an anticoagulant adjusted tube and pt plans to return this afternoon around 3pm. If repeat INR is subtherapeutic will add TEE.  2. HTN/CKD K+ is increased, amlodipine recently stopped as part of Lotrel and continued with benazapril Will stop benazapril and add amlodipine 5 mg qd Istat to be repeated am of cardioversion  3. Polycythemia PharmD resident was working with me this am in clinic and was concerned with pt taking testosterone and increase in hematocrit. She sent his PCP a staff message re stopping testosterone with lab findings.  F/u in afib clinic in one week  Butch Penny C. Razia Screws, [facility] [address] [place], [place] [address] [phone]

## 2015-07-14 NOTE — Discharge Instructions (Signed)
Electrical Cardioversion, Care After °Refer to this sheet in the next few weeks. These instructions provide you with information on caring for yourself after your procedure. Your health care provider may also give you more specific instructions. Your treatment has been planned according to current medical practices, but problems sometimes occur. Call your health care provider if you have any problems or questions after your procedure. °WHAT TO EXPECT AFTER THE PROCEDURE °After your procedure, it is typical to have the following sensations: °· Some redness on the skin where the shocks were delivered. If this is tender, a sunburn lotion or hydrocortisone cream may help. °· Possible return of an abnormal heart rhythm within hours or days after the procedure. °HOME CARE INSTRUCTIONS °· Take medicines only as directed by your health care provider. Be sure you understand how and when to take your medicine. °· Learn how to feel your pulse and check it often. °· Limit your activity for 48 hours after the procedure or as directed by your health care provider. °· Avoid or minimize caffeine and other stimulants as directed by your health care provider. °SEEK MEDICAL CARE IF: °· You feel like your heart is beating too fast or your pulse is not regular. °· You have any questions about your medicines. °· You have bleeding that will not stop. °SEEK IMMEDIATE MEDICAL CARE IF: °· You are dizzy or feel faint. °· It is hard to breathe or you feel short of breath. °· There is a change in discomfort in your chest. °· Your speech is slurred or you have trouble moving an arm or leg on one side of your body. °· You get a serious muscle cramp that does not go away. °· Your fingers or toes turn cold or blue. °  °This information is not intended to replace advice given to you by your health care provider. Make sure you discuss any questions you have with your health care provider. °  °Document Released: 02/07/2013 Document Revised: 05/10/2014  Document Reviewed: 02/07/2013 °Elsevier Interactive Patient Education ©2016 Elsevier Inc. ° °

## 2015-07-14 NOTE — Transfer of Care (Signed)
Immediate Anesthesia Transfer of Care Note  Patient: [name]  Procedure(s) Performed: Procedure(s): TRANSESOPHAGEAL ECHOCARDIOGRAM (TEE) (N/A) CARDIOVERSION (N/A)  Patient Location: PACU and Endoscopy Unit  Anesthesia Type:MAC  Level of Consciousness: awake, alert , oriented and pateint uncooperative  Airway & Oxygen Therapy: Patient Spontanous Breathing and Patient connected to nasal cannula oxygen  Post-op Assessment: Report given to RN, Post -op Vital signs reviewed and stable and Patient moving all extremities X 4  Post vital signs: Reviewed and stable  Last Vitals:  Filed Vitals:   07/14/15 1226 07/14/15 1335  BP: 160/112 98/62  Pulse: 120 61  Temp: 36.3 C 36.3 C  Resp: 21 26    Complications: No apparent anesthesia complications

## 2015-07-17 ENCOUNTER — Ambulatory Visit: Payer: Medicare Other | Admitting: Family Medicine

## 2015-07-17 DIAGNOSIS — E291 Testicular hypofunction: Secondary | ICD-10-CM

## 2015-07-17 MED ORDER — TESTOSTERONE CYPIONATE 200 MG/ML IM SOLN
200.0000 mg | INTRAMUSCULAR | Status: DC
  Administered 2015-07-17: 250 mg via INTRAMUSCULAR

## 2015-07-21 ENCOUNTER — Ambulatory Visit (INDEPENDENT_AMBULATORY_CARE_PROVIDER_SITE_OTHER): Payer: Medicare Other | Admitting: *Deleted

## 2015-07-21 ENCOUNTER — Ambulatory Visit (HOSPITAL_COMMUNITY)
Admission: RE | Admit: 2015-07-21 | Discharge: 2015-07-21 | Disposition: A | Discharge status: Home / Self Care | Payer: Medicare Other | Source: Ambulatory Visit | Attending: Nurse Practitioner | Admitting: Nurse Practitioner

## 2015-07-21 ENCOUNTER — Encounter (HOSPITAL_COMMUNITY): Payer: Self-pay | Admitting: Nurse Practitioner

## 2015-07-21 VITALS — BP 140/84 | HR 71 | Ht 69.0 in | Wt 167.2 lb

## 2015-07-21 DIAGNOSIS — E785 Hyperlipidemia, unspecified: Secondary | ICD-10-CM | POA: Insufficient documentation

## 2015-07-21 DIAGNOSIS — Z833 Family history of diabetes mellitus: Secondary | ICD-10-CM | POA: Insufficient documentation

## 2015-07-21 DIAGNOSIS — I10 Essential (primary) hypertension: Secondary | ICD-10-CM | POA: Insufficient documentation

## 2015-07-21 DIAGNOSIS — I4892 Unspecified atrial flutter: Secondary | ICD-10-CM | POA: Diagnosis not present

## 2015-07-21 DIAGNOSIS — I484 Atypical atrial flutter: Secondary | ICD-10-CM | POA: Diagnosis not present

## 2015-07-21 DIAGNOSIS — E039 Hypothyroidism, unspecified: Secondary | ICD-10-CM | POA: Insufficient documentation

## 2015-07-21 DIAGNOSIS — Z79899 Other long term (current) drug therapy: Secondary | ICD-10-CM | POA: Diagnosis not present

## 2015-07-21 DIAGNOSIS — Z87891 Personal history of nicotine dependence: Secondary | ICD-10-CM | POA: Insufficient documentation

## 2015-07-21 DIAGNOSIS — Z5181 Encounter for therapeutic drug level monitoring: Secondary | ICD-10-CM | POA: Diagnosis not present

## 2015-07-21 DIAGNOSIS — Z841 Family history of disorders of kidney and ureter: Secondary | ICD-10-CM | POA: Insufficient documentation

## 2015-07-21 DIAGNOSIS — Z7901 Long term (current) use of anticoagulants: Secondary | ICD-10-CM | POA: Insufficient documentation

## 2015-07-21 DIAGNOSIS — M353 Polymyalgia rheumatica: Secondary | ICD-10-CM | POA: Insufficient documentation

## 2015-07-21 DIAGNOSIS — Z825 Family history of asthma and other chronic lower respiratory diseases: Secondary | ICD-10-CM | POA: Diagnosis not present

## 2015-07-21 LAB — BASIC METABOLIC PANEL
ANION GAP: 10 (ref 5–15)
BUN: 27 mg/dL — ABNORMAL HIGH (ref 6–20)
CALCIUM: 9.1 mg/dL (ref 8.9–10.3)
CHLORIDE: 102 mmol/L (ref 101–111)
CO2: 27 mmol/L (ref 22–32)
CREATININE: 1.68 mg/dL — AB (ref 0.61–1.24)
GFR calc non Af Amer: 35 mL/min — ABNORMAL LOW (ref >60)
GFR, EST AFRICAN AMERICAN: 41 mL/min — AB (ref >60)
Glucose, Bld: 82 mg/dL (ref 65–99)
Potassium: 5.2 mmol/L — ABNORMAL HIGH (ref 3.5–5.1)
SODIUM: 139 mmol/L (ref 135–145)

## 2015-07-21 LAB — CBC
HCT: 53.9 % — ABNORMAL HIGH (ref 39.0–52.0)
HEMOGLOBIN: 18.3 g/dL — AB (ref 13.0–17.0)
MCH: 31.8 pg (ref 26.0–34.0)
MCHC: 34 g/dL (ref 30.0–36.0)
MCV: 93.6 fL (ref 78.0–100.0)
Platelets: 165 10*3/uL (ref 150–400)
RBC: 5.76 MIL/uL (ref 4.22–5.81)
RDW: 13.9 % (ref 11.5–15.5)
WBC: 6.9 10*3/uL (ref 4.0–10.5)

## 2015-07-21 LAB — POCT INR: INR: 3.2

## 2015-07-21 MED ORDER — METOPROLOL SUCCINATE ER 25 MG PO TB24
25.0000 mg | ORAL_TABLET | Freq: Every day | ORAL | Status: DC
Start: 2015-07-21 — End: 2015-07-21

## 2015-07-21 MED ORDER — METOPROLOL SUCCINATE ER 25 MG PO TB24
25.0000 mg | ORAL_TABLET | Freq: Every day | ORAL | Status: DC
Start: 2015-07-21 — End: 2015-10-05

## 2015-07-21 NOTE — Progress Notes (Signed)
Patient ID: [name], male   DOB: 08/16/1925, 82 y.o.   MRN: [id number]     Primary Care Physician: Gabriel Post, MD Referring Physician: Dr. Earl Many [name] is a 82 y.o. male with a h/o atrail flutter that is here for f/u from TEE and cardioversion. He is in SR today. He had  a sub therapeutic INR before DCCV so TEE was needed . He did return to SR but did have some feeling off days after cardioversion. ARB was stopped due to hyperkalemia and he was placed on amlodipine, taking lotrel in the past. He has not noticed any rapid rhythm since cardioversion.  PharmD working in clinic, raised concern with pt's hematocrit over 50, with guidelines stating that that may be a untoward consequence of testosterone shots. Pt does get these shots every two weeks.   Today, he denies symptoms of palpitations, chest pain, shortness of breath, orthopnea, PND, lower extremity edema, dizziness, presyncope, syncope, or neurologic sequela. The patient is tolerating medications without difficulties and is otherwise without complaint today.   Past Medical History  Diagnosis Date  . AF (atrial fibrillation) [facility])     a. s/p RFCA/PVI @ [facility] clinic 2007.  [name] Neoplasm of uncertain behavior     SKIN  . Hyperlipidemia   . Hypothyroidism   . HYPERTENSION, BENIGN 08/21/2009    Qualifier: Diagnosis of  By: Caryl Comes, MD, Remus Blake  Medications(s)  enalapril   . Polymyalgia rheumatica ([place]) 01/13/2007    Qualifier: History of  By: Danny Lawless CMA, [place]     . Arthritis     "joints" (06/21/2013)  . Atrial flutter ([place])     a. 06/2013 s/p TEE/DCCV  . Chronic systolic CHF (congestive heart failure) ([place])     a. 06/2013 Echo: EF 20%, diff HK, mild LVH.  [name] History of cardiovascular stress test     Lexiscan Myoview 8/16:  EF 59%, attenuation artifact, no ischemia; Low Risk   Past Surgical History  Procedure Laterality Date  . Hand surgery Right     "broke bone; grafted area w/bone from somewhere  else"  . Cataract extraction, bilateral Bilateral Summer 2009  . Tonsillectomy    . Cholecystectomy    . Inguinal hernia repair Bilateral   . Atrial fibrillation ablation  2007  . Hemorrhoid surgery    . Tee without cardioversion N/A 06/22/2013    Procedure: TRANSESOPHAGEAL ECHOCARDIOGRAM (TEE);  Surgeon: Sueanne Margarita, MD;  Location: [place];  Service: Cardiovascular;  Laterality: N/A;  . Cardioversion N/A 06/22/2013    Procedure: CARDIOVERSION;  Surgeon: Sueanne Margarita, MD;  Location: [facility] ENDOSCOPY;  Service: Cardiovascular;  Laterality: N/A;  15:17 Lido 20mg ,IV , Propofol 30 mg, IV synched cardioversion @ 150 joules by Dr. Lajoyce Lauber from a flutter to sinis brady which is baseline for this pt, reports 45-50 reg HR verified by Dr. Ashok Norris  . Cardioversion N/A 11/21/2014    Procedure: CARDIOVERSION;  Surgeon: Fay Records, MD;  Location: [facility] ENDOSCOPY;  Service: Cardiovascular;  Laterality: N/A;  . Tee without cardioversion N/A 07/14/2015    Procedure: TRANSESOPHAGEAL ECHOCARDIOGRAM (TEE);  Surgeon: Sueanne Margarita, MD;  Location: [place];  Service: Cardiovascular;  Laterality: N/A;  . Cardioversion N/A 07/14/2015    Procedure: CARDIOVERSION;  Surgeon: Sueanne Margarita, MD;  Location: [place] ENDOSCOPY;  Service: Cardiovascular;  Laterality: N/A;    Current Outpatient Prescriptions  Medication Sig Dispense Refill  . amLODipine (NORVASC) 5 MG tablet Take 1 tablet (5  mg total) by mouth daily. 30 tablet 3  . atorvastatin (LIPITOR) 10 MG tablet Take 1 tablet (10 mg total) by mouth daily. 90 tablet 1  . carboxymethylcellulose (REFRESH TEARS) 0.5 % SOLN Place 1 drop into both eyes 3 (three) times daily as needed (dry eyes).     . cholecalciferol (VITAMIN D) 1000 UNITS tablet Take 1,000 Units by mouth every morning.    . Glucosamine-Chondroitin 500-400 MG CAPS Take 1 capsule by mouth every morning.    . hydrochlorothiazide (HYDRODIURIL) 25 MG tablet Take 1 tablet (25 mg total) by mouth daily. 90  tablet 2  . levothyroxine (SYNTHROID, LEVOTHROID) 50 MCG tablet Take 1 tablet (50 mcg total) by mouth daily. 90 tablet 2  . Multiple Vitamins-Minerals (CENTRUM SILVER PO) Take 1 tablet by mouth every morning.     [name] OVER THE COUNTER MEDICATION Take 1 tablet by mouth daily.    . sildenafil (REVATIO) 20 MG tablet Take 60 mg by mouth daily as needed (erectile dusfunction).     [name] testosterone cypionate (DEPOTESTOSTERONE CYPIONATE) 200 MG/ML injection Inject 1.5 mLs (300 mg total) into the muscle every 14 (fourteen) days. 10 mL 1  . warfarin (COUMADIN) 2 MG tablet Take 1.5-2 tablets (3-4 mg total) by mouth daily. As directed by Coumadin Clinic 150 tablet 1  . metoprolol succinate (TOPROL-XL) 25 MG 24 hr tablet Take 1 tablet (25 mg total) by mouth daily. 180 tablet 1   No current facility-administered medications for this encounter.    No Known Allergies  Social History   Social History  . Marital Status: Married    Spouse Name: [name]  . Number of Children: 3  . Years of Education: 16   Occupational History  . RETIRED BANKER     Still active w/stocks  . RETIRED    Social History Main Topics  . Smoking status: Former Smoker -- 1.00 packs/day for 12 years    Types: Cigarettes    Quit date: 05/04/1963  . Smokeless tobacco: Never Used  . Alcohol Use: Yes     Comment: 06/21/2013 "I was a moderate drinker at most; rarely have drink now"  . Drug Use: No  . Sexual Activity:    Partners: Female   Other Topics Concern  . Not on file   Social History Narrative   [name], [place],  MBA, Chiropodist. Married 1958. 2 -girls '59, '62; 1 son '64; 2 grandchildren. Retired Customer service manager. END of LIFE CARE: Yes - DNR, yes - for short term mechanical ventilation.has a living will, would not want any heroic/futile care.  Wife sustained fracture proximal humerus left - Sept '11    Family History  Problem Relation Age of Onset  . Emphysema Mother   . Diabetes Mother   .  Kidney disease Father     ROS- All systems are reviewed and negative except as per the HPI above  Physical Exam: Filed Vitals:   07/21/15 1345  BP: 140/84  Pulse: 71  Height: 5\' 9"  (1.753 m)  Weight: 167 lb 3.2 oz (75.841 kg)    GEN- The patient is well appearing, alert and oriented x 3 today.   Head- normocephalic, atraumatic Eyes-  Sclera clear, conjunctiva pink Ears- hearing intact Oropharynx- clear Neck- supple, no JVP Lymph- no cervical lymphadenopathy Lungs- Clear to ausculation bilaterally, normal work of breathing Heart- Regular rate and rhythm, no murmurs, rubs or gallops, PMI not laterally displaced GI- soft, NT, ND, + BS Extremities- no clubbing, cyanosis, or  edema MS- no significant deformity or atrophy Skin- no rash or lesion Psych- euthymic mood, full affect Neuro- strength and sensation are intact  EKG-SR with PC's, pr int 136 ms, qrs int 82 ms, qtc 419 ms Epic records reviewed Last INR 3.2 3/20  Assessment and Plan: 1. Aflutter Successful cardioversion Continue metoprolol Continue warfarin  2. HTN Stable on amlodipine bmet today for f/u hyperkalemia off ARB  3. Elevated hematocrit Will f/u on PharmD concerns re elevated hematocrit over 50 and use of testosterone  F/u with Dr. Caryl Comes in May as scheduled afib clinic as needed  Geroge Baseman. [place], [facility] [address] [place], [place] [address] [phone]

## 2015-07-25 ENCOUNTER — Encounter: Payer: Self-pay | Admitting: Family Medicine

## 2015-07-25 ENCOUNTER — Ambulatory Visit (INDEPENDENT_AMBULATORY_CARE_PROVIDER_SITE_OTHER): Payer: Medicare Other | Admitting: Family Medicine

## 2015-07-25 VITALS — BP 142/80 | HR 59 | Temp 97.6°F | Ht 69.0 in | Wt 166.3 lb

## 2015-07-25 DIAGNOSIS — N183 Chronic kidney disease, stage 3 unspecified: Secondary | ICD-10-CM

## 2015-07-25 DIAGNOSIS — I4892 Unspecified atrial flutter: Secondary | ICD-10-CM | POA: Diagnosis not present

## 2015-07-25 DIAGNOSIS — I10 Essential (primary) hypertension: Secondary | ICD-10-CM

## 2015-07-25 DIAGNOSIS — D751 Secondary polycythemia: Secondary | ICD-10-CM | POA: Diagnosis not present

## 2015-07-25 NOTE — Progress Notes (Signed)
Pre visit review using our clinic review tool, if applicable. No additional management support is needed unless otherwise documented below in the visit note. 

## 2015-07-25 NOTE — Progress Notes (Signed)
Subjective:    Patient ID: [name], male    DOB: Aug 26, 1925, 82 y.o.   MRN: [id number]  HPI  patient seen for medical follow-up   Hypertension treated with amlodipine, metoprolol , HCTZ. He was previously on ACE inhibitor but had bump in creatinine. Recent creatinine through cardiology improved at 1.68. Blood pressures from home readings reviewed and stable.   Atrial fibrillation with recent cardioversion March 13. He's done well since that time. Remains on Coumadin.  With recent labs noted elevated hematocrit of 57. We concurred with cardiology that he should stop testosterone injections. He had been on replacement for several years.  His hematocrit had been gradually increasing for some time.  Past Medical History  Diagnosis Date  . AF (atrial fibrillation) [facility])     a. s/p RFCA/PVI @ [facility] clinic 2007.  Marland Kitchen Neoplasm of uncertain behavior     SKIN  . Hyperlipidemia   . Hypothyroidism   . HYPERTENSION, BENIGN 08/21/2009    Qualifier: Diagnosis of  By: Caryl Comes, MD, Remus Blake  Medications(s)  enalapril   . Polymyalgia rheumatica ([place]) 01/13/2007    Qualifier: History of  By: Danny Lawless CMA, [place]     . Arthritis     "joints" (06/21/2013)  . Atrial flutter ([place])     a. 06/2013 s/p TEE/DCCV  . Chronic systolic CHF (congestive heart failure) ([place])     a. 06/2013 Echo: EF 20%, diff HK, mild LVH.  Marland Kitchen History of cardiovascular stress test     Lexiscan Myoview 8/16:  EF 59%, attenuation artifact, no ischemia; Low Risk   Past Surgical History  Procedure Laterality Date  . Hand surgery Right     "broke bone; grafted area w/bone from somewhere else"  . Cataract extraction, bilateral Bilateral Summer 2009  . Tonsillectomy    . Cholecystectomy    . Inguinal hernia repair Bilateral   . Atrial fibrillation ablation  2007  . Hemorrhoid surgery    . Tee without cardioversion N/A 06/22/2013    Procedure: TRANSESOPHAGEAL ECHOCARDIOGRAM (TEE);  Surgeon: Sueanne Margarita, MD;   Location: [place];  Service: Cardiovascular;  Laterality: N/A;  . Cardioversion N/A 06/22/2013    Procedure: CARDIOVERSION;  Surgeon: Sueanne Margarita, MD;  [facility] ENDOS[facility];  Service: Cardiovascular;  Laterality: N/A;  15:17 Lido 20mg ,IV , Propofol 30 mg, IV synched cardioversion @ 150 joules by Dr. Lajoyce Lauber from a flutter to sinis brady which is baseline for this pt, reports 45-50 reg HR verified by Dr. Ashok Norris  . Cardioversion N/A 11/21/2014    Procedure: CARDIOVERSION;  Surgeon: Fay Records, MD;  Location: [facility] ENDOSCOPY;  Service: Cardiovascular;  Laterality: N/A;  . Tee without cardioversion N/A 07/14/2015    Procedure: TRANSESOPHAGEAL ECHOCARDIOGRAM (TEE);  Surgeon: Sueanne Margarita, MD;  Location: [place];  Service: Cardiovascular;  Laterality: N/A;  . Cardioversion N/A 07/14/2015    Procedure: CARDIOVERSION;  Surgeon: Sueanne Margarita, MD;  [facility] ENDOS[facility];  Service: Cardiovascular;  Laterality: N/A;    reports that he quit smoking about 52 years ago. His smoking use included Cigarettes. He has a 12 pack-year smoking history. He has never used smokeless tobacco. He reports that he drinks alcohol. He reports that he does not use illicit drugs. family history includes Diabetes in his mother; Emphysema in his mother; Kidney disease in his father. No Known Allergies    Review of Systems  Constitutional: Negative for fatigue.  Eyes: Negative for visual disturbance.  Respiratory: Negative for cough,  chest tightness and shortness of breath.   Cardiovascular: Negative for chest pain, palpitations and leg swelling.  Endocrine: Negative for polydipsia and polyuria.  Neurological: Negative for dizziness, syncope, weakness, light-headedness and headaches.       Objective:   Physical Exam  Constitutional: He appears well-developed and well-nourished.  Neck: Neck supple. No thyromegaly present.  Cardiovascular: Normal rate and regular rhythm.   Pulmonary/Chest: Effort normal  and breath sounds normal. No respiratory distress. He has no wheezes. He has no rales.  Musculoskeletal: He exhibits no edema.  Lymphadenopathy:    He has no cervical adenopathy.          Assessment & Plan:   #1 hypertension. Stable and at goal. Continue current medications  #2 chronic kidney disease slightly improved by recent labs. Plan to repeat these in 4 months at follow-up  #3 atrial fibrillation with recent cardioversion. Continue Coumadin  #4 polycythemia related to testosterone injections. Testosterone discontinued. Recheck CBC in 4 months

## 2015-07-31 ENCOUNTER — Ambulatory Visit: Payer: Medicare Other | Admitting: Family Medicine

## 2015-08-05 ENCOUNTER — Ambulatory Visit (INDEPENDENT_AMBULATORY_CARE_PROVIDER_SITE_OTHER): Payer: Medicare Other | Admitting: *Deleted

## 2015-08-05 DIAGNOSIS — Z5181 Encounter for therapeutic drug level monitoring: Secondary | ICD-10-CM

## 2015-08-05 DIAGNOSIS — I4892 Unspecified atrial flutter: Secondary | ICD-10-CM

## 2015-08-05 LAB — POCT INR: INR: 2.3

## 2015-08-26 ENCOUNTER — Ambulatory Visit (INDEPENDENT_AMBULATORY_CARE_PROVIDER_SITE_OTHER): Payer: Medicare Other | Admitting: *Deleted

## 2015-08-26 DIAGNOSIS — I4892 Unspecified atrial flutter: Secondary | ICD-10-CM | POA: Diagnosis not present

## 2015-08-26 DIAGNOSIS — Z5181 Encounter for therapeutic drug level monitoring: Secondary | ICD-10-CM

## 2015-08-26 LAB — POCT INR: INR: 1.9

## 2015-09-16 ENCOUNTER — Ambulatory Visit (INDEPENDENT_AMBULATORY_CARE_PROVIDER_SITE_OTHER): Payer: Medicare Other | Admitting: *Deleted

## 2015-09-16 DIAGNOSIS — Z5181 Encounter for therapeutic drug level monitoring: Secondary | ICD-10-CM | POA: Diagnosis not present

## 2015-09-16 DIAGNOSIS — I4892 Unspecified atrial flutter: Secondary | ICD-10-CM

## 2015-09-16 LAB — POCT INR: INR: 1.5

## 2015-09-27 ENCOUNTER — Other Ambulatory Visit: Payer: Self-pay | Admitting: Internal Medicine

## 2015-09-30 ENCOUNTER — Ambulatory Visit (INDEPENDENT_AMBULATORY_CARE_PROVIDER_SITE_OTHER): Payer: Medicare Other | Admitting: *Deleted

## 2015-09-30 DIAGNOSIS — I4892 Unspecified atrial flutter: Secondary | ICD-10-CM | POA: Diagnosis not present

## 2015-09-30 DIAGNOSIS — Z5181 Encounter for therapeutic drug level monitoring: Secondary | ICD-10-CM | POA: Diagnosis not present

## 2015-09-30 LAB — POCT INR: INR: 1.6

## 2015-10-02 ENCOUNTER — Emergency Department (HOSPITAL_COMMUNITY): Payer: Medicare Other

## 2015-10-02 ENCOUNTER — Encounter (HOSPITAL_COMMUNITY): Payer: Self-pay | Admitting: Emergency Medicine

## 2015-10-02 ENCOUNTER — Observation Stay (HOSPITAL_COMMUNITY)
Admission: EM | Admit: 2015-10-02 | Discharge: 2015-10-05 | Disposition: A | Discharge status: Home / Self Care | Payer: Medicare Other | Attending: Internal Medicine | Admitting: Internal Medicine

## 2015-10-02 DIAGNOSIS — I5022 Chronic systolic (congestive) heart failure: Secondary | ICD-10-CM | POA: Insufficient documentation

## 2015-10-02 DIAGNOSIS — I480 Paroxysmal atrial fibrillation: Principal | ICD-10-CM

## 2015-10-02 DIAGNOSIS — I428 Other cardiomyopathies: Secondary | ICD-10-CM | POA: Diagnosis not present

## 2015-10-02 DIAGNOSIS — N183 Chronic kidney disease, stage 3 unspecified: Secondary | ICD-10-CM

## 2015-10-02 DIAGNOSIS — Z79899 Other long term (current) drug therapy: Secondary | ICD-10-CM | POA: Diagnosis not present

## 2015-10-02 DIAGNOSIS — Z87891 Personal history of nicotine dependence: Secondary | ICD-10-CM | POA: Insufficient documentation

## 2015-10-02 DIAGNOSIS — I471 Supraventricular tachycardia, unspecified: Secondary | ICD-10-CM

## 2015-10-02 DIAGNOSIS — I4891 Unspecified atrial fibrillation: Secondary | ICD-10-CM | POA: Diagnosis present

## 2015-10-02 DIAGNOSIS — Z7901 Long term (current) use of anticoagulants: Secondary | ICD-10-CM | POA: Insufficient documentation

## 2015-10-02 DIAGNOSIS — M353 Polymyalgia rheumatica: Secondary | ICD-10-CM | POA: Diagnosis not present

## 2015-10-02 DIAGNOSIS — I484 Atypical atrial flutter: Secondary | ICD-10-CM

## 2015-10-02 DIAGNOSIS — R0602 Shortness of breath: Secondary | ICD-10-CM | POA: Diagnosis not present

## 2015-10-02 DIAGNOSIS — R000 Tachycardia, unspecified: Secondary | ICD-10-CM | POA: Diagnosis present

## 2015-10-02 DIAGNOSIS — I10 Essential (primary) hypertension: Secondary | ICD-10-CM | POA: Diagnosis present

## 2015-10-02 DIAGNOSIS — I5033 Acute on chronic diastolic (congestive) heart failure: Secondary | ICD-10-CM | POA: Diagnosis not present

## 2015-10-02 DIAGNOSIS — I4892 Unspecified atrial flutter: Secondary | ICD-10-CM | POA: Diagnosis not present

## 2015-10-02 DIAGNOSIS — I130 Hypertensive heart and chronic kidney disease with heart failure and stage 1 through stage 4 chronic kidney disease, or unspecified chronic kidney disease: Secondary | ICD-10-CM | POA: Insufficient documentation

## 2015-10-02 DIAGNOSIS — E039 Hypothyroidism, unspecified: Secondary | ICD-10-CM | POA: Diagnosis not present

## 2015-10-02 DIAGNOSIS — E785 Hyperlipidemia, unspecified: Secondary | ICD-10-CM | POA: Diagnosis not present

## 2015-10-02 DIAGNOSIS — R079 Chest pain, unspecified: Secondary | ICD-10-CM | POA: Diagnosis not present

## 2015-10-02 LAB — BASIC METABOLIC PANEL
ANION GAP: 8 (ref 5–15)
BUN: 37 mg/dL — AB (ref 6–20)
CHLORIDE: 100 mmol/L — AB (ref 101–111)
CO2: 27 mmol/L (ref 22–32)
Calcium: 9.2 mg/dL (ref 8.9–10.3)
Creatinine, Ser: 1.44 mg/dL — ABNORMAL HIGH (ref 0.61–1.24)
GFR calc Af Amer: 49 mL/min — ABNORMAL LOW (ref >60)
GFR, EST NON AFRICAN AMERICAN: 42 mL/min — AB (ref >60)
GLUCOSE: 182 mg/dL — AB (ref 65–99)
Potassium: 3.7 mmol/L (ref 3.5–5.1)
Sodium: 135 mmol/L (ref 135–145)

## 2015-10-02 LAB — PROTIME-INR
INR: 1.68 — AB (ref 0.00–1.49)
Prothrombin Time: 19.8 seconds — ABNORMAL HIGH (ref 11.6–15.2)

## 2015-10-02 LAB — I-STAT TROPONIN, ED: Troponin i, poc: 0.41 ng/mL (ref 0.00–0.08)

## 2015-10-02 LAB — CBC
HEMATOCRIT: 44.5 % (ref 39.0–52.0)
HEMOGLOBIN: 15.2 g/dL (ref 13.0–17.0)
MCH: 30.5 pg (ref 26.0–34.0)
MCHC: 34.2 g/dL (ref 30.0–36.0)
MCV: 89.2 fL (ref 78.0–100.0)
Platelets: 162 10*3/uL (ref 150–400)
RBC: 4.99 MIL/uL (ref 4.22–5.81)
RDW: 13.1 % (ref 11.5–15.5)
WBC: 6.1 10*3/uL (ref 4.0–10.5)

## 2015-10-02 MED ORDER — METOPROLOL TARTRATE 5 MG/5ML IV SOLN
5.0000 mg | Freq: Once | INTRAVENOUS | Status: AC
  Administered 2015-10-02: 5 mg via INTRAVENOUS
  Filled 2015-10-02: qty 5

## 2015-10-02 MED ORDER — SODIUM CHLORIDE 0.9 % IV BOLUS (SEPSIS)
1000.0000 mL | Freq: Once | INTRAVENOUS | Status: AC
  Administered 2015-10-02: 1000 mL via INTRAVENOUS

## 2015-10-02 NOTE — ED Notes (Signed)
Per pt, hx of afib, on coumadin. Pt started feeling bad 4-5 days ago, checked his pulse this evening. Pt HR is 150. Pt denies pain, c/o SOB intermittently. In NAD at this time.

## 2015-10-02 NOTE — ED Provider Notes (Signed)
CSN: [id number]     Arrival date & time 10/02/15  2101 History   First MD Initiated Contact with Patient 10/02/15 2138     Chief Complaint  Patient presents with  . Tachycardia   The history is provided by the patient, the spouse and medical records. No language interpreter was used.   Patient is a 82 year old male with past medical history of atrial fibrillation and atrial flutter (on Coumadin) disease, CHF, hypertension, hyperlipidemia who presents for tachycardia. Patient reports he was feeling generally unwell for the past 3 days. He describes this as a generalized fatigue, not wanting to do his usual activities, poor po intake, and feeling slightly queasy in his stomach. Today he felt worse and checked his pulse and found his heart rate to be in the 150s. He did not feel heart palpitations and report this is normal for him when he has episodes of atrial fibrillation. Denies fever, chills, chest pain, shortness of breath, vomiting, diarrhea, abdominal pain or any other symptoms. He reports that his INR is below recently and his Coumadin was increased this week starting on Tuesday.  He presented to the ED for further evaluation.  Past Medical History  Diagnosis Date  . AF (atrial fibrillation) [facility])     a. s/p RFCA/PVI @ [facility] clinic 2007.  Marland Kitchen Neoplasm of uncertain behavior     SKIN  . Hyperlipidemia   . Hypothyroidism   . HYPERTENSION, BENIGN 08/21/2009    Qualifier: Diagnosis of  By: Caryl Comes, MD, Remus Blake  Medications(s)  enalapril   . Polymyalgia rheumatica ([place]) 01/13/2007    Qualifier: History of  By: Danny Lawless CMA, [place]     . Arthritis     "joints" (06/21/2013)  . Atrial flutter ([place])     a. 06/2013 s/p TEE/DCCV  . Chronic systolic CHF (congestive heart failure) ([place])     a. 06/2013 Echo: EF 20%, diff HK, mild LVH.  Marland Kitchen History of cardiovascular stress test     Lexiscan Myoview 8/16:  EF 59%, attenuation artifact, no ischemia; Low Risk   Past Surgical History  Procedure  Laterality Date  . Hand surgery Right     "broke bone; grafted area w/bone from somewhere else"  . Cataract extraction, bilateral Bilateral Summer 2009  . Tonsillectomy    . Cholecystectomy    . Inguinal hernia repair Bilateral   . Atrial fibrillation ablation  2007  . Hemorrhoid surgery    . Tee without cardioversion N/A 06/22/2013    Procedure: TRANSESOPHAGEAL ECHOCARDIOGRAM (TEE);  Surgeon: Sueanne Margarita, MD;  Location: [facility];  Service: Cardiovascular;  Laterality: N/A;  . Cardioversion N/A 06/22/2013    Procedure: CARDIOVERSION;  Surgeon: Sueanne Margarita, MD;  Location: [facility] ENDOSCOPY;  Service: Cardiovascular;  Laterality: N/A;  15:17 Lido 20mg ,IV , Propofol 30 mg, IV synched cardioversion @ 150 joules by Dr. Lajoyce Lauber from a flutter to sinis brady which is baseline for this pt, reports 45-50 reg HR verified by Dr. Ashok Norris  . Cardioversion N/A 11/21/2014    Procedure: CARDIOVERSION;  Surgeon: Fay Records, MD;  Location: [facility] ENDOSCOPY;  Service: Cardiovascular;  Laterality: N/A;  . Tee without cardioversion N/A 07/14/2015    Procedure: TRANSESOPHAGEAL ECHOCARDIOGRAM (TEE);  Surgeon: Sueanne Margarita, MD;  Location: [place];  Service: Cardiovascular;  Laterality: N/A;  . Cardioversion N/A 07/14/2015    Procedure: CARDIOVERSION;  Surgeon: Sueanne Margarita, MD;  Location: [facility] ENDOSCOPY;  Service: Cardiovascular;  Laterality: N/A;   Family History  Problem  Relation Age of Onset  . Emphysema Mother   . Diabetes Mother   . Kidney disease Father    Social History  Substance Use Topics  . Smoking status: Former Smoker -- 1.00 packs/day for 12 years    Types: Cigarettes    Quit date: 05/04/1963  . Smokeless tobacco: Never Used  . Alcohol Use: Yes     Comment: 06/21/2013 "I was a moderate drinker at most; rarely have drink now"    Review of Systems  Constitutional: Positive for fatigue. Negative for fever and chills.  HENT: Negative for congestion and rhinorrhea.   Eyes: Negative for  visual disturbance.  Respiratory: Negative for cough and shortness of breath.   Cardiovascular: Negative for chest pain and leg swelling.  Gastrointestinal: Negative for vomiting, abdominal pain and diarrhea.  Genitourinary: Negative for dysuria and difficulty urinating.  Musculoskeletal: Negative for back pain and neck pain.  Skin: Negative for pallor and rash.  Neurological: Negative for dizziness and headaches.  Hematological:       Chronic anticoagulation on Coumadin  Psychiatric/Behavioral: Negative for confusion.      Allergies  Review of patient's allergies indicates no known allergies.  Home Medications   Prior to Admission medications   Medication Sig Start Date End Date Taking? Authorizing Provider  amLODipine (NORVASC) 5 MG tablet Take 1 tablet (5 mg total) by mouth daily. 07/11/15   Sherran Needs, NP  atorvastatin (LIPITOR) 10 MG tablet Take 1 tablet (10 mg total) by mouth daily. 07/14/15   [name], MD  carboxymethylcellulose (REFRESH TEARS) 0.5 % SOLN Place 1 drop into both eyes 3 (three) times daily as needed (dry eyes).     Historical Provider, MD  cholecalciferol (VITAMIN D) 1000 UNITS tablet Take 1,000 Units by mouth every morning.    Historical Provider, MD  Glucosamine-Chondroitin 500-400 MG CAPS Take 1 capsule by mouth every morning.    Historical Provider, MD  hydrochlorothiazide (HYDRODIURIL) 25 MG tablet take 1 tablet by mouth once daily 09/30/15   [name], MD  levothyroxine (SYNTHROID, LEVOTHROID) 50 MCG tablet Take 1 tablet (50 mcg total) by mouth daily. 04/21/15   [name], MD  metoprolol succinate (TOPROL-XL) 25 MG 24 hr tablet Take 1 tablet (25 mg total) by mouth daily. 07/21/15   Sherran Needs, NP  Multiple Vitamins-Minerals (CENTRUM SILVER PO) Take 1 tablet by mouth every morning.     Historical Provider, MD  OVER THE COUNTER MEDICATION Take 1 tablet by mouth daily.    Historical Provider, MD  sildenafil (REVATIO) 20 MG tablet  Take 60 mg by mouth daily as needed (erectile dusfunction).     Historical Provider, MD  warfarin (COUMADIN) 2 MG tablet Take 1.5-2 tablets (3-4 mg total) by mouth daily. As directed by Coumadin Clinic 07/14/15   [name], MD   BP 120/89 mmHg  Pulse 145  Temp(Src) 97.3 F (36.3 C)  Resp 22  SpO2 100% Physical Exam  Constitutional: He is oriented to person, place, and time. He appears well-developed and well-nourished.  HENT:  Head: Normocephalic and atraumatic.  Eyes: EOM are normal. Pupils are equal, round, and reactive to light.  Neck: Normal range of motion. Neck supple.  Cardiovascular: Intact distal pulses.  An irregularly irregular rhythm present. Tachycardia present.   Pulmonary/Chest: Effort normal and breath sounds normal. No respiratory distress. He has no wheezes. He has no rales.  Abdominal: Soft. He exhibits no distension. There is no tenderness.  Musculoskeletal: Normal range  of motion. He exhibits no edema or tenderness.  Neurological: He is alert and oriented to person, place, and time.  Skin: Skin is warm and dry. No rash noted.  Psychiatric: He has a normal mood and affect.  Nursing note and vitals reviewed.   ED Course  Procedures (including critical care time) Labs Review Labs Reviewed  BASIC METABOLIC PANEL - Abnormal; Notable for the following:    Chloride 100 (*)    Glucose, Bld 182 (*)    BUN 37 (*)    Creatinine, Ser 1.44 (*)    GFR calc non Af Amer 42 (*)    GFR calc Af Amer 49 (*)    All other components within normal limits  PROTIME-INR - Abnormal; Notable for the following:    Prothrombin Time 19.8 (*)    INR 1.68 (*)    All other components within normal limits  I-STAT TROPOININ, ED - Abnormal; Notable for the following:    Troponin i, poc 0.41 (*)    All other components within normal limits  I-STAT TROPOININ, ED - Abnormal; Notable for the following:    Troponin i, poc 0.43 (*)    All other components within normal limits  CBC     Imaging Review Dg Chest 2 View  10/02/2015  CLINICAL DATA:  Chest pain and shortness of breath. EXAM: CHEST  2 VIEW COMPARISON:  06/21/2013 FINDINGS: Cardiomegaly is unchanged from prior exam. Mediastinal contours are normal. Minimal central bronchitic change. No pulmonary edema, consolidation, pleural effusion or pneumothorax. IMPRESSION: Stable mild cardiomegaly and chronic bronchitic change. No superimposed acute process. Electronically Signed   By: Jeb Levering M.D.   On: 10/02/2015 23:08   I have personally reviewed and evaluated these images and lab results as part of my medical decision-making.   EKG Interpretation   Date/Time:  Thursday October 02 2015 21:16:39 EDT Ventricular Rate:  145 PR Interval:    QRS Duration: 86 QT Interval:  318 QTC Calculation: 493 R Axis:   30 Text Interpretation:  Supraventricular tachycardia Nonspecific ST  abnormality Abnormal ECG Confirmed by MESNER MD, Corene Cornea [phone]) on 10/02/2015  9:39:01 PM      MDM   Final diagnoses:  Paroxysmal atrial fibrillation (HCC)  SVT (supraventricular tachycardia) ([place])    On presentation patient is alert and normotensive but tachycardic to the 140s. Initial EKG shows a regular narrow complex tachycardia consistent with SVT. Heart rate improved after modified valsalva maneuvers and then noted to be in atrial fibrillation with rates up to 120s. IV fluid bolus given and labs obtained. Remarkable for elevated troponin at 0.4. This is likely due to demand for patient's tachycardia. BUN and creatinine are elevated but stable compared with prior. INR remains subtherapeutic at 1.68. Continued to have atrial fibrillation with rates up to the 120s. Additional IV normal saline bolus and 5 mg of IV metoprolol given. Subsequently patient's rate improved into the 90s, still in atrial fibrillation. He remains mildly fatigued but denies any chest pain or shortness of breath. Repeat troponin is mildly increased. Although this is  likely still due to demand from patient's tachycardia, plan to admit for serial troponins and medication management. ASA not given as he has a documented allergy. On reviewing the medical records it appears that as of  patient's last cardiology visit, patient has been inadvertently not adherent to his medication regimen, which may be contributing to his symptoms today. He was supposed to be taking 37.5 mg of metoprolol twice a day but he  is taking 25 mg daily.  Hospitalist consulted for admission and triage of patient. Admitted to Dr. Hal Hope for telemetry monitoring and further evaluation.  Patient seen and discussed with Dr. Dayna Barker, ED attending    Gibson Ramp, MD 10/03/15 0131  Merrily Pew, MD 10/03/15 [id number]

## 2015-10-03 ENCOUNTER — Encounter (HOSPITAL_COMMUNITY): Payer: Self-pay | Admitting: Internal Medicine

## 2015-10-03 DIAGNOSIS — I480 Paroxysmal atrial fibrillation: Secondary | ICD-10-CM | POA: Diagnosis not present

## 2015-10-03 DIAGNOSIS — I4891 Unspecified atrial fibrillation: Secondary | ICD-10-CM | POA: Diagnosis present

## 2015-10-03 LAB — CBC WITH DIFFERENTIAL/PLATELET
Basophils Absolute: 0 10*3/uL (ref 0.0–0.1)
Basophils Relative: 0 %
EOS ABS: 0.1 10*3/uL (ref 0.0–0.7)
Eosinophils Relative: 2 %
HEMATOCRIT: 43.9 % (ref 39.0–52.0)
HEMOGLOBIN: 14.5 g/dL (ref 13.0–17.0)
LYMPHS ABS: 1.1 10*3/uL (ref 0.7–4.0)
LYMPHS PCT: 21 %
MCH: 29.7 pg (ref 26.0–34.0)
MCHC: 33 g/dL (ref 30.0–36.0)
MCV: 90 fL (ref 78.0–100.0)
Monocytes Absolute: 0.5 10*3/uL (ref 0.1–1.0)
Monocytes Relative: 9 %
NEUTROS ABS: 3.6 10*3/uL (ref 1.7–7.7)
NEUTROS PCT: 68 %
Platelets: 127 10*3/uL — ABNORMAL LOW (ref 150–400)
RBC: 4.88 MIL/uL (ref 4.22–5.81)
RDW: 13.2 % (ref 11.5–15.5)
WBC: 5.3 10*3/uL (ref 4.0–10.5)

## 2015-10-03 LAB — COMPREHENSIVE METABOLIC PANEL
ALT: 19 U/L (ref 17–63)
ANION GAP: 5 (ref 5–15)
AST: 34 U/L (ref 15–41)
Albumin: 3.4 g/dL — ABNORMAL LOW (ref 3.5–5.0)
Alkaline Phosphatase: 53 U/L (ref 38–126)
BUN: 27 mg/dL — ABNORMAL HIGH (ref 6–20)
CHLORIDE: 104 mmol/L (ref 101–111)
CO2: 32 mmol/L (ref 22–32)
Calcium: 8.9 mg/dL (ref 8.9–10.3)
Creatinine, Ser: 1.28 mg/dL — ABNORMAL HIGH (ref 0.61–1.24)
GFR, EST AFRICAN AMERICAN: 56 mL/min — AB (ref >60)
GFR, EST NON AFRICAN AMERICAN: 49 mL/min — AB (ref >60)
Glucose, Bld: 149 mg/dL — ABNORMAL HIGH (ref 65–99)
POTASSIUM: 4.2 mmol/L (ref 3.5–5.1)
SODIUM: 141 mmol/L (ref 135–145)
Total Bilirubin: 1.3 mg/dL — ABNORMAL HIGH (ref 0.3–1.2)
Total Protein: 6.3 g/dL — ABNORMAL LOW (ref 6.5–8.1)

## 2015-10-03 LAB — TSH: TSH: 3.245 u[IU]/mL (ref 0.350–4.500)

## 2015-10-03 LAB — I-STAT TROPONIN, ED: Troponin i, poc: 0.43 ng/mL (ref 0.00–0.08)

## 2015-10-03 LAB — TROPONIN I
TROPONIN I: 0.36 ng/mL — AB (ref <0.031)
TROPONIN I: 0.35 ng/mL — AB (ref <0.031)
Troponin I: 0.31 ng/mL — ABNORMAL HIGH (ref <0.031)

## 2015-10-03 MED ORDER — ACETAMINOPHEN 325 MG PO TABS: 650.0000 mg | ORAL_TABLET | Freq: Four times a day (QID) | ORAL | Status: DC | PRN

## 2015-10-03 MED ORDER — LEVOTHYROXINE SODIUM 50 MCG PO TABS
50.0000 ug | ORAL_TABLET | Freq: Every day | ORAL | Status: DC
  Administered 2015-10-03 – 2015-10-05 (×3): 50 ug via ORAL
  Filled 2015-10-03 (×3): qty 1

## 2015-10-03 MED ORDER — METOPROLOL SUCCINATE ER 25 MG PO TB24
25.0000 mg | ORAL_TABLET | Freq: Two times a day (BID) | ORAL | Status: DC
  Administered 2015-10-03 – 2015-10-04 (×2): 25 mg via ORAL
  Filled 2015-10-03 (×2): qty 1

## 2015-10-03 MED ORDER — HYDROCHLOROTHIAZIDE 25 MG PO TABS
25.0000 mg | ORAL_TABLET | Freq: Every day | ORAL | Status: DC
  Administered 2015-10-03 – 2015-10-04 (×2): 25 mg via ORAL
  Filled 2015-10-03 (×2): qty 1

## 2015-10-03 MED ORDER — METOPROLOL SUCCINATE ER 25 MG PO TB24
25.0000 mg | ORAL_TABLET | Freq: Every day | ORAL | Status: DC
  Administered 2015-10-03: 25 mg via ORAL
  Filled 2015-10-03: qty 1

## 2015-10-03 MED ORDER — METOPROLOL SUCCINATE ER 25 MG PO TB24: 25.0000 mg | ORAL_TABLET | Freq: Two times a day (BID) | ORAL | Status: DC

## 2015-10-03 MED ORDER — METOPROLOL SUCCINATE ER 25 MG PO TB24: 37.5000 mg | ORAL_TABLET | Freq: Two times a day (BID) | ORAL | Status: DC

## 2015-10-03 MED ORDER — ATORVASTATIN CALCIUM 10 MG PO TABS
10.0000 mg | ORAL_TABLET | Freq: Every day | ORAL | Status: DC
  Administered 2015-10-03 – 2015-10-04 (×2): 10 mg via ORAL
  Filled 2015-10-03 (×2): qty 1

## 2015-10-03 MED ORDER — WARFARIN SODIUM 3 MG PO TABS
6.0000 mg | ORAL_TABLET | Freq: Once | ORAL | Status: AC
  Administered 2015-10-03: 6 mg via ORAL
  Filled 2015-10-03: qty 2

## 2015-10-03 MED ORDER — ONDANSETRON HCL 4 MG PO TABS: 4.0000 mg | ORAL_TABLET | Freq: Four times a day (QID) | ORAL | Status: DC | PRN

## 2015-10-03 MED ORDER — WARFARIN - PHARMACIST DOSING INPATIENT
Freq: Every day | Status: DC
  Administered 2015-10-03: 1

## 2015-10-03 MED ORDER — AMLODIPINE BESYLATE 5 MG PO TABS
5.0000 mg | ORAL_TABLET | Freq: Every day | ORAL | Status: DC
  Administered 2015-10-03 – 2015-10-04 (×2): 5 mg via ORAL
  Filled 2015-10-03 (×2): qty 1

## 2015-10-03 MED ORDER — ACETAMINOPHEN 650 MG RE SUPP: 650.0000 mg | Freq: Four times a day (QID) | RECTAL | Status: DC | PRN

## 2015-10-03 MED ORDER — ONDANSETRON HCL 4 MG/2ML IJ SOLN: 4.0000 mg | Freq: Four times a day (QID) | INTRAMUSCULAR | Status: DC | PRN

## 2015-10-03 NOTE — H&P (Signed)
History and Physical    [name] [id number] DOB: Oct 10, 1925 DOA: 10/02/2015  PCP: [name], MD  Patient coming from: Home.  Chief Complaint: Not feeling well.  HPI: [name] is a 82 y.o. male with medical history significant of atrial fibrillation status post ablation, recurrent atrial flutter, cardiomyopathy nonischemic, hypothyroidism hyperlipidemia chronic kidney disease presents to ER after patient states he was not feeling well for last 2 weeks. Patient states last evening he checked his blood pressure and heart rate and was elevated. In the ER patient was found to be having heart rate around 150 beats per minute an EKG was showing possible SVT pattern. Patient's heart rate improved with vagal maneuvers and IV bolus. But subsequently went into A. fib with RVR. Patient's heart rate improved with metoprolol 5 mg IV but still was having a heart rate around 110 bpm with mildly elevated troponin. Denies any chest pain or shortness of breath had some poor appetite denies any nausea vomiting abdominal pain or diarrhea. Patient states he has not missed his medications. Has not had any fever or chills.   ED Course: As explained in the history of presenting illness.  Review of Systems: As per HPI otherwise 10 point review of systems negative.    Past Medical History  Diagnosis Date  . AF (atrial fibrillation) [facility])     a. s/p RFCA/PVI @ [facility] clinic 2007.  Marland Kitchen Neoplasm of uncertain behavior     SKIN  . Hyperlipidemia   . Hypothyroidism   . HYPERTENSION, BENIGN 08/21/2009    Qualifier: Diagnosis of  By: Caryl Comes, MD, Remus Blake  Medications(s)  enalapril   . Polymyalgia rheumatica ([place]) 01/13/2007    Qualifier: History of  By: Danny Lawless CMA, [place]     . Arthritis     "joints" (06/21/2013)  . Atrial flutter ([place])     a. 06/2013 s/p TEE/DCCV  . Chronic systolic CHF (congestive heart failure) ([place])     a. 06/2013 Echo: EF 20%, diff HK, mild LVH.  Marland Kitchen History of  cardiovascular stress test     Lexiscan Myoview 8/16:  EF 59%, attenuation artifact, no ischemia; Low Risk    Past Surgical History  Procedure Laterality Date  . Hand surgery Right     "broke bone; grafted area w/bone from somewhere else"  . Cataract extraction, bilateral Bilateral Summer 2009  . Tonsillectomy    . Cholecystectomy    . Inguinal hernia repair Bilateral   . Atrial fibrillation ablation  2007  . Hemorrhoid surgery    . Tee without cardioversion N/A 06/22/2013    Procedure: TRANSESOPHAGEAL ECHOCARDIOGRAM (TEE);  Surgeon: Sueanne Margarita, MD;  Location: [place];  Service: Cardiovascular;  Laterality: N/A;  . Cardioversion N/A 06/22/2013    Procedure: CARDIOVERSION;  Surgeon: Sueanne Margarita, MD;  Location: [facility] ENDOSCOPY;  Service: Cardiovascular;  Laterality: N/A;  15:17 Lido 20mg ,IV , Propofol 30 mg, IV synched cardioversion @ 150 joules by Dr. Lajoyce Lauber from a flutter to sinis brady which is baseline for this pt, reports 45-50 reg HR verified by Dr. Ashok Norris  . Cardioversion N/A 11/21/2014    Procedure: CARDIOVERSION;  Surgeon: Fay Records, MD;  Location: [facility] ENDOSCOPY;  Service: Cardiovascular;  Laterality: N/A;  . Tee without cardioversion N/A 07/14/2015    Procedure: TRANSESOPHAGEAL ECHOCARDIOGRAM (TEE);  Surgeon: Sueanne Margarita, MD;  Location: [place];  Service: Cardiovascular;  Laterality: N/A;  . Cardioversion N/A 07/14/2015    Procedure: CARDIOVERSION;  Surgeon: Tressia Miners  Remonia Richter, MD;  Location: [place] ENDOSCOPY;  Service: Cardiovascular;  Laterality: N/A;     reports that he quit smoking about 52 years ago. His smoking use included Cigarettes. He has a 12 pack-year smoking history. He has never used smokeless tobacco. He reports that he drinks alcohol. He reports that he does not use illicit drugs.  No Known Allergies  Family History  Problem Relation Age of Onset  . Emphysema Mother   . Diabetes Mother   . Kidney disease Father     Prior to Admission medications    Medication Sig Start Date End Date Taking? Authorizing Provider  amLODipine (NORVASC) 5 MG tablet Take 1 tablet (5 mg total) by mouth daily. 07/11/15  Yes Sherran Needs, NP  atorvastatin (LIPITOR) 10 MG tablet Take 1 tablet (10 mg total) by mouth daily. 07/14/15  Yes [name], MD  carboxymethylcellulose (REFRESH TEARS) 0.5 % SOLN Place 1 drop into both eyes 3 (three) times daily as needed (dry eyes).    Yes Historical Provider, MD  cholecalciferol (VITAMIN D) 1000 UNITS tablet Take 1,000 Units by mouth every morning.   Yes Historical Provider, MD  Glucosamine-Chondroitin 500-400 MG CAPS Take 1 capsule by mouth every morning.   Yes Historical Provider, MD  hydrochlorothiazide (HYDRODIURIL) 25 MG tablet take 1 tablet by mouth once daily 09/30/15  Yes [name], MD  levothyroxine (SYNTHROID, LEVOTHROID) 50 MCG tablet Take 1 tablet (50 mcg total) by mouth daily. 04/21/15  Yes [name], MD  metoprolol succinate (TOPROL-XL) 25 MG 24 hr tablet Take 1 tablet (25 mg total) by mouth daily. 07/21/15  Yes Sherran Needs, NP  Multiple Vitamins-Minerals (CENTRUM SILVER PO) Take 1 tablet by mouth every morning.    Yes Historical Provider, MD  sildenafil (REVATIO) 20 MG tablet Take 60 mg by mouth daily as needed (erectile dusfunction).    Yes Historical Provider, MD  warfarin (COUMADIN) 2 MG tablet Take 1.5-2 tablets (3-4 mg total) by mouth daily. As directed by Coumadin Clinic Patient taking differently: Take 3-4 mg by mouth daily. 4mg  on Sun, Tue, Thu, Sat.    3mg  MWF. 07/14/15  Yes [name], MD    Physical Exam: Filed Vitals:   10/03/15 0030 10/03/15 0130 10/03/15 0230 10/03/15 0358  BP: 109/97 119/94 121/87 115/63  Pulse:    128  Temp:    97.9 F (36.6 C)  TempSrc:    Oral  Resp: 17 15 15 16   Height:    5\' 9"  (1.753 m)  Weight:    167 lb 6.4 oz (75.932 kg)  SpO2: 96% 100% 96% 100%      Constitutional: Not in distress. Filed Vitals:   10/03/15 0030 10/03/15 0130  10/03/15 0230 10/03/15 0358  BP: 109/97 119/94 121/87 115/63  Pulse:    128  Temp:    97.9 F (36.6 C)  TempSrc:    Oral  Resp: 17 15 15 16   Height:    5\' 9"  (1.753 m)  Weight:    167 lb 6.4 oz (75.932 kg)  SpO2: 96% 100% 96% 100%   Eyes: Anicteric no pallor. ENMT: No discharge from the ears eyes nose and mouth. Neck: No mass felt. No JVD appreciated. Respiratory: No rhonchi or crepitations. Cardiovascular: S1-S2 heard. Abdomen: Soft nontender bowel sounds present. Musculoskeletal: No edema. Skin: No rash. Neurologic: Alert awake oriented to time place and person. Moves all extremities. Psychiatric: Appears normal.   Labs on Admission: I have personally reviewed following  labs and imaging studies  CBC:  Recent Labs Lab 10/02/15 2155  WBC 6.1  HGB 15.2  HCT 44.5  MCV 89.2  PLT 0000000   Basic Metabolic Panel:  Recent Labs Lab 10/02/15 2155  NA 135  K 3.7  CL 100*  CO2 27  GLUCOSE 182*  BUN 37*  CREATININE 1.44*  CALCIUM 9.2   GFR: Estimated Creatinine Clearance: 36.1 mL/min (by C-G formula based on Cr of 1.44). Liver Function Tests: No results for input(s): AST, ALT, ALKPHOS, BILITOT, PROT, ALBUMIN in the last 168 hours. No results for input(s): LIPASE, AMYLASE in the last 168 hours. No results for input(s): AMMONIA in the last 168 hours. Coagulation Profile:  Recent Labs Lab 09/30/15 1122 10/02/15 2201  INR 1.6 1.68*   Cardiac Enzymes: No results for input(s): CKTOTAL, CKMB, CKMBINDEX, TROPONINI in the last 168 hours. BNP (last 3 results) No results for input(s): PROBNP in the last 8760 hours. HbA1C: No results for input(s): HGBA1C in the last 72 hours. CBG: No results for input(s): GLUCAP in the last 168 hours. Lipid Profile: No results for input(s): CHOL, HDL, LDLCALC, TRIG, CHOLHDL, LDLDIRECT in the last 72 hours. Thyroid Function Tests: No results for input(s): TSH, T4TOTAL, FREET4, T3FREE, THYROIDAB in the last 72 hours. Anemia Panel: No  results for input(s): VITAMINB12, FOLATE, FERRITIN, TIBC, IRON, RETICCTPCT in the last 72 hours. Urine analysis: No results found for: COLORURINE, APPEARANCEUR, LABSPEC, PHURINE, GLUCOSEU, HGBUR, BILIRUBINUR, KETONESUR, PROTEINUR, UROBILINOGEN, NITRITE, LEUKOCYTESUR Sepsis Labs: @LABRCNTIP (procalcitonin:4,lacticidven:4) )No results found for this or any previous visit (from the past 240 hour(s)).   Radiological Exams on Admission: Dg Chest 2 View  10/02/2015  CLINICAL DATA:  Chest pain and shortness of breath. EXAM: CHEST  2 VIEW COMPARISON:  06/21/2013 FINDINGS: Cardiomegaly is unchanged from prior exam. Mediastinal contours are normal. Minimal central bronchitic change. No pulmonary edema, consolidation, pleural effusion or pneumothorax. IMPRESSION: Stable mild cardiomegaly and chronic bronchitic change. No superimposed acute process. Electronically Signed   By: Jeb Levering M.D.   On: 10/02/2015 23:08    EKG: Independently reviewed. First EKG shows SVT and second EKG shows A. fib with RVR.  Assessment/Plan Principal Problem:   Atrial fibrillation with RVR (HCC) Active Problems:   Hypothyroidism   HYPERTENSION, BENIGN   Polymyalgia rheumatica (HCC)   CKD (chronic kidney disease) stage 3, GFR 30-59 ml/min   NICM (nonischemic cardiomyopathy) ([place])   Atrial fibrillation ([place])    #1. A. fib with RVR and initially showing SVT - presently heart rate is around 110 bpm in A. fib. I have ordered home dose of metoprolol by mouth now and also discussed with cardiologist who will be seeing patient in consult. Since patient troponin is elevated we will cycle cardiac markers. Check TSH. Coumadin per pharmacy. Chads 2 vascular score 4. #2. Elevated troponin probably from elevated heart rate - cycle cardiac markers. Patient is on Coumadin and statins and beta blockers. Denies any chest pain. #3. Hypertension - continue hydrochlorothiazide amlodipine and metoprolol. #4. Hypothyroidism on Synthroid.  Check TSH. #5. Chronic kidney disease stage III creatinine appears to be at baseline. #6. History of polymyalgia rheumatica per chart. #7. History of nonischemic cardiomyopathy with last EF measured was 50%. Appears to be euvolemic at this time. #8. Hyperlipidemia on statins.   DVT prophylaxis: Coumadin. Code Status: Full code.  Family Communication: No family at the bedside.  Disposition Plan: Home.  Consults called: Cardiology.  Admission status: Observation. Telemetry.    Rise Patience MD [facility] Pager  [place] [phone].  If 7PM-7AM, please contact night-coverage [email or web address] Password [email or web address]  10/03/2015, 8:03 AM

## 2015-10-03 NOTE — ED Notes (Signed)
Attempted report 

## 2015-10-03 NOTE — Consult Note (Signed)
ELECTROPHYSIOLOGY CONSULT NOTE    Patient ID: [name] MRN: [id number], DOB/AGE: 1925-07-25 82 y.o.  Admit date: 10/02/2015 Date of Consult: 10/03/2015  Primary Physician: [name], MD Primary Cardiologist/Electrophysiologist: Dr. Caryl Comes Requesting MD: Dr. Hal Hope  Reason for Consultation: AFib, RVR  HPI: [name] is a 82 y.o. male came to [facility] ER yesterday with an observation at home that his HR was fast 130's and SBP 90's.  He has known PAFib/flutter seen last by cardiology 07/04/15 with rates 120 and his meds were adjusted to up-titrate his BB, he was seen at AF clinic and planned for DCCV, he had DCCV 07/14/15 to SB 40's, and f/u AF clinic 07/21/15 in [place] at that time noting metoprolol was at 25mg  daily, unclear when or by who this was decreased.  PMHx includes PAF, HTN, HLD, arthritis, PMR, CM/CHF, no known hx of CAD  The patient states for a bout 10 days he has felt less energetic, though states since not ling after his last DCCV has not felt quite the same energy that he usually has.  He has not had any kind of CP, no perceived palpitations and no SOB.  No dizziness, near syncope or syncope.  He reports today feels significantly better then he did yesterday.  He was treated in ER with IVF bolus and IV dose of metoprolol  Labs: Creat 1.44>> 1.28 POC Trop 0.41, 0.41 >> Trop I of 0.36 H/H 14.5/43.9, WBC 5.3, plts 127 INR 1.68     AF history:  DCCV 07/14/15 DCCV 11/21/14 DCCV 06/22/13 PVI ablation at [facility] clinic 2007 Hx of amiodarone unable to tolerate secondary bradycardia Coumadin for a/c tx   Past Medical History  Diagnosis Date  . AF (atrial fibrillation) [facility])     a. s/p RFCA/PVI @ [facility] clinic 2007.  Marland Kitchen Neoplasm of uncertain behavior     SKIN  . Hyperlipidemia   . Hypothyroidism   . HYPERTENSION, BENIGN 08/21/2009    Qualifier: Diagnosis of  By: Caryl Comes, MD, Remus Blake  Medications(s)  enalapril   . Polymyalgia rheumatica ([place])  01/13/2007    Qualifier: History of  By: Danny Lawless CMA, [place]     . Arthritis     "joints" (06/21/2013)  . Atrial flutter ([place])     a. 06/2013 s/p TEE/DCCV  . Chronic systolic CHF (congestive heart failure) ([place])     a. 06/2013 Echo: EF 20%, diff HK, mild LVH.  Marland Kitchen History of cardiovascular stress test     Lexiscan Myoview 8/16:  EF 59%, attenuation artifact, no ischemia; Low Risk     Surgical History:  Past Surgical History  Procedure Laterality Date  . Hand surgery Right     "broke bone; grafted area w/bone from somewhere else"  . Cataract extraction, bilateral Bilateral Summer 2009  . Tonsillectomy    . Cholecystectomy    . Inguinal hernia repair Bilateral   . Atrial fibrillation ablation  2007  . Hemorrhoid surgery    . Tee without cardioversion N/A 06/22/2013    Procedure: TRANSESOPHAGEAL ECHOCARDIOGRAM (TEE);  Surgeon: Sueanne Margarita, MD;  Location: [facility];  Service: Cardiovascular;  Laterality: N/A;  . Cardioversion N/A 06/22/2013    Procedure: CARDIOVERSION;  Surgeon: Sueanne Margarita, MD;  Location: [facility] ENDOSCOPY;  Service: Cardiovascular;  Laterality: N/A;  15:17 Lido 20mg ,IV , Propofol 30 mg, IV synched cardioversion @ 150 joules by Dr. Lajoyce Lauber from a flutter to sinis brady which is baseline for this pt, reports 45-50 reg HR verified  by Dr. Ashok Norris  . Cardioversion N/A 11/21/2014    Procedure: CARDIOVERSION;  Surgeon: Fay Records, MD;  Location: [facility] ENDOSCOPY;  Service: Cardiovascular;  Laterality: N/A;  . Tee without cardioversion N/A 07/14/2015    Procedure: TRANSESOPHAGEAL ECHOCARDIOGRAM (TEE);  Surgeon: Sueanne Margarita, MD;  Location: [facility];  Service: Cardiovascular;  Laterality: N/A;  . Cardioversion N/A 07/14/2015    Procedure: CARDIOVERSION;  Surgeon: Sueanne Margarita, MD;  Location: [facility] ENDOSCOPY;  Service: Cardiovascular;  Laterality: N/A;     Prescriptions prior to admission  Medication Sig Dispense Refill Last Dose  . amLODipine (NORVASC) 5 MG tablet Take 1  tablet (5 mg total) by mouth daily. 30 tablet 3 10/02/2015 at Unknown time  . atorvastatin (LIPITOR) 10 MG tablet Take 1 tablet (10 mg total) by mouth daily. 90 tablet 1 10/02/2015 at Unknown time  . carboxymethylcellulose (REFRESH TEARS) 0.5 % SOLN Place 1 drop into both eyes 3 (three) times daily as needed (dry eyes).    10/02/2015 at Unknown time  . cholecalciferol (VITAMIN D) 1000 UNITS tablet Take 1,000 Units by mouth every morning.   10/02/2015 at Unknown time  . Glucosamine-Chondroitin 500-400 MG CAPS Take 1 capsule by mouth every morning.   10/02/2015 at Unknown time  . hydrochlorothiazide (HYDRODIURIL) 25 MG tablet take 1 tablet by mouth once daily 90 tablet 0 10/02/2015 at Unknown time  . levothyroxine (SYNTHROID, LEVOTHROID) 50 MCG tablet Take 1 tablet (50 mcg total) by mouth daily. 90 tablet 2 10/02/2015 at Unknown time  . metoprolol succinate (TOPROL-XL) 25 MG 24 hr tablet Take 1 tablet (25 mg total) by mouth daily. 180 tablet 1 10/02/2015 at 0800  . Multiple Vitamins-Minerals (CENTRUM SILVER PO) Take 1 tablet by mouth every morning.    10/02/2015 at Unknown time  . sildenafil (REVATIO) 20 MG tablet Take 60 mg by mouth daily as needed (erectile dusfunction).    unknown  . warfarin (COUMADIN) 2 MG tablet Take 1.5-2 tablets (3-4 mg total) by mouth daily. As directed by Coumadin Clinic (Patient taking differently: Take 3-4 mg by mouth daily. 4mg  on Sun, Tue, Thu, Sat.    3mg  MWF.) 150 tablet 1 10/02/2015 at Unknown time    Inpatient Medications:  . amLODipine  5 mg Oral Daily  . atorvastatin  10 mg Oral q1800  . hydrochlorothiazide  25 mg Oral Daily  . levothyroxine  50 mcg Oral QAC breakfast  . metoprolol succinate  25 mg Oral Daily  . warfarin  6 mg Oral ONCE-1800  . Warfarin - Pharmacist Dosing Inpatient   Does not apply q1800    Allergies: No Known Allergies  Social History   Social History  . Marital Status: Married    Spouse Name: Mikaele Steffek  . Number of Children: 3  . Years of  Education: 16   Occupational History  . RETIRED BANKER     Still active w/stocks  . RETIRED    Social History Main Topics  . Smoking status: Former Smoker -- 1.00 packs/day for 12 years    Types: Cigarettes    Quit date: 05/04/1963  . Smokeless tobacco: Never Used  . Alcohol Use: Yes     Comment: 06/21/2013 "I was a moderate drinker at most; rarely have drink now"  . Drug Use: No  . Sexual Activity:    Partners: Female   Other Topics Concern  . Not on file   Social History Narrative   Salome [place], [place],  MBA, Chiropodist.  Married 1958. 2 -girls '59, '62; 1 son '64; 2 grandchildren. Retired Customer service manager. END of LIFE CARE: Yes - DNR, yes - for short term mechanical ventilation.has a living [facility] Secrist, would not want any heroic/futile care.  Wife sustained fracture proximal humerus left - Sept '11     Family History  Problem Relation Age of Onset  . Emphysema Mother   . Diabetes Mother   . Kidney disease Father      Review of Systems: All other systems reviewed and are otherwise negative except as noted above.  Physical Exam: Filed Vitals:   10/03/15 0230 10/03/15 0358 10/03/15 1012 10/03/15 1219  BP: 121/87 115/63 120/60 117/82  Pulse:  128 120 130  Temp:  97.9 F (36.6 C)  97.9 F (36.6 C)  TempSrc:  Oral  Oral  Resp: 15 16  17   Height:  5\' 9"  (1.753 m)    Weight:  167 lb 6.4 oz (75.932 kg)    SpO2: 96% 100%  98%    GEN- The patient is well appearing, alert and oriented x 3 today.   HEENT: normocephalic, atraumatic; sclera clear, conjunctiva pink; hearing intact; oropharynx clear; neck supple, no JVP Lymph- no cervical lymphadenopathy Lungs- Clear to ausculation bilaterally, normal work of breathing.  No wheezes, rales, rhonchi Heart- Irregular rate and rhythm/tachycardic, no significant murmurs, rubs or gallops, PMI not laterally displaced GI- soft, non-tender, non-distended, bowel sounds present Extremities- no clubbing, cyanosis, or edema MS- no  significant deformity or atrophy Skin- warm and dry, no rash or lesion, he has a large area of ecchymosis R hip, no hematoma Psych- euthymic mood, full affect Neuro- no gross deficits observed  Labs:   Lab Results  Component Value Date   WBC 5.3 10/03/2015   HGB 14.5 10/03/2015   HCT 43.9 10/03/2015   MCV 90.0 10/03/2015   PLT 127* 10/03/2015    Recent Labs Lab 10/03/15 0852  NA 141  K 4.2  CL 104  CO2 32  BUN 27*  CREATININE 1.28*  CALCIUM 8.9  PROT 6.3*  BILITOT 1.3*  ALKPHOS 53  ALT 19  AST 34  GLUCOSE 149*      Radiology/Studies:  Dg Chest 2 View 10/02/2015  CLINICAL DATA:  Chest pain and shortness of breath. EXAM: CHEST  2 VIEW COMPARISON:  06/21/2013 FINDINGS: Cardiomegaly is unchanged from prior exam. Mediastinal contours are normal. Minimal central bronchitic change. No pulmonary edema, consolidation, pleural effusion or pneumothorax. IMPRESSION: Stable mild cardiomegaly and chronic bronchitic change. No superimposed acute process. Electronically Signed   By: Jeb Levering M.D.   On: 10/02/2015 23:08    EKG: AFlutter 145bpm, slight ST depression lat leads #2 AFib 133, appear similar to 1st TELEMETRY: AFib, flutter, 110-130  07/14/15: TEE Study Conclusions - Left ventricle: Systolic function was mildly to moderately  reduced. The estimated ejection fraction was in the range of 40%  to 45%. Diffuse hypokinesis. - Aortic valve: There was trivial regurgitation. - Mitral valve: There was mild regurgitation. - Left atrium: The atrium was severely dilated. No evidence of  thrombus in the atrial cavity or appendage. There was spontaneous  echo contrast (&quot;smoke&quot;). The appendage was morphologically a left  appendage, multilobulated, and of normal size. Emptying velocity  was mildly reduced. - Right ventricle: The cavity size was mildly dilated. Wall  thickness was normal. Systolic function was moderately reduced. - Right atrium: The atrium was  moderately dilated. No evidence of  thrombus in the atrial cavity or appendage. There was spontaneous  echo contrast (&quot;smoke&quot;). - Atrial septum: No defect or patent foramen ovale was identified.  Echo contrast study showed no right-to-left atrial level shunt,  following an increase in RA pressure induced by provocative  maneuvers. - Pulmonic valve: No evidence of vegetation. No evidence of  vegetation. There was mild regurgitation.  09/30/13: Echocardiogram Study Conclusions - Left ventricle: The cavity size was normal. Wall thickness was normal. Systolic function was normal. The estimated ejection fraction was in the range of 55% to 60%. Wall motion was normal; there were no regional wall motion abnormalities. Left ventricular diastolic function parameters were normal. - Aortic valve: Trivial regurgitation. - Mitral valve: Mild regurgitation. - Left atrium: The atrium was mildly dilated.  06/21/13 : Echo EF 20%  12/04/14: lexiscan stress test Study Highlights     Nuclear stress EF: 59%.  The left ventricular ejection fraction is normal (55-65%).  There was no ST segment deviation noted during stress.  No T wave inversion was noted during stress.  Defect 1: There is a small defect of moderate severity.  This is a low risk study. Low risk stress nuclear study with a fixed basal inferoseptal perfusion defect and normal left ventricular regional and global systolic function. No reversible ischemia. The inferoseptal defect most likely represents an attenuation artifact.    Assessment and Plan:   1. PAFib with RVR, likley has been for the last 7-10 days given onset of his fatigue     At his last clinic visit with RVR he was to have increased his Metoprolol to 37.5 BID         Aleyza Salmi increase his BB to 25mg  BID     It has been felt that he was not a tikosyn candidate guven hx of bradycardia and amio failed secondary to bradycardia     CHA2DS2Vasc is at  least 4 on warfarin      He has been subtherapeautic on his INR, rate control strategy for now, monitoring for bradycardia as well     BP is stable  2. Very large area of ecchymosis to R hip     No appreciable hematoma     Patient denies trauma/falls     subtherapeutic INR, Kymari Nuon monitor carefully while INR becomes therapeutic     3. Abnormal Troponin have been flat     No CP, no clear ischemic EKG changes     Appears he has had similar troponins in the past with RVR and likely demand secondary to tachycardia     Normal stress test Aug 2015      4. CM     No CHF on xray or exam     On BB/ACE out pt    Signed, Tommye Standard, PA-C  10/03/2015 2:13 PM    I have seen and examined this patient with Tommye Standard.  Agree with above, note added to reflect my findings.  On exam, irregular rhythm, no murmurs, lungs clear. Presented in AF with RVR to the hospital after feeling poorly for the last few weeks.  HR was 140s at home.  Unfortunately, has subtheraputic INR.  Rate control preferred at this point.  Lindzy Rupert plan to increase metoprolol to 25 mg BID.  Can titrate up if necessary.  Has had bradycardia in the past, may benefit from pacemaker in the future if tachy-brady.    Orra Nolde M. Dayne Chait MD 10/03/2015 4:57 PM

## 2015-10-03 NOTE — Progress Notes (Signed)
[facility] PLAN OF CARE NOTE Patient: [name] [id number]   PCP: [name], MD DOB: 10/22/1925   DOA: 10/02/2015   DOS: 10/03/2015    Patient was admitted by my colleague Dr. Hal Hope earlier on 10/03/2015. I have reviewed the H&P as well as assessment and plan and agree with the same. Important changes in the plan are listed below.  Plan of care: Principal Problem:   Atrial fibrillation with RVR (HCC) Continue rate control. No indication for bridging per cardiology. Increase metoprolol 25 mg twice a day.   Author: Berle Mull, MD [facility] Pager: [phone] 10/03/2015 6:17 PM   If 7PM-7AM, please contact night-coverage at [email or web address], password [facility]

## 2015-10-03 NOTE — Progress Notes (Signed)
ANTICOAGULATION CONSULT NOTE - Initial Consult  Pharmacy Consult for coumadin Indication: atrial fibrillation  No Known Allergies  Patient Measurements: Height: 5\' 9"  (175.3 cm) Weight: 167 lb 6.4 oz (75.932 kg) IBW/kg (Calculated) : 70.7  Vital Signs: Temp: 97.9 F (36.6 C) (06/02 0358) Temp Source: Oral (06/02 0358) BP: 115/63 mmHg (06/02 0358) Pulse Rate: 128 (06/02 0358)  Labs:  Recent Labs  09/30/15 1122 10/02/15 2155 10/02/15 2201  HGB  --  15.2  --   HCT  --  44.5  --   PLT  --  162  --   LABPROT  --   --  19.8*  INR 1.6  --  1.68*  CREATININE  --  1.44*  --     Estimated Creatinine Clearance: 36.1 mL/min (by C-G formula based on Cr of 1.44).   Medical History: Past Medical History  Diagnosis Date  . AF (atrial fibrillation) [facility])     a. s/p RFCA/PVI @ [facility] clinic 2007.  Marland Kitchen Neoplasm of uncertain behavior     SKIN  . Hyperlipidemia   . Hypothyroidism   . HYPERTENSION, BENIGN 08/21/2009    Qualifier: Diagnosis of  By: Caryl Comes, MD, Remus Blake  Medications(s)  enalapril   . Polymyalgia rheumatica ([place]) 01/13/2007    Qualifier: History of  By: Danny Lawless CMA, [place]     . Arthritis     "joints" (06/21/2013)  . Atrial flutter ([place])     a. 06/2013 s/p TEE/DCCV  . Chronic systolic CHF (congestive heart failure) ([place])     a. 06/2013 Echo: EF 20%, diff HK, mild LVH.  Marland Kitchen History of cardiovascular stress test     Lexiscan Myoview 8/16:  EF 59%, attenuation artifact, no ischemia; Low Risk    Medications:  Prescriptions prior to admission  Medication Sig Dispense Refill Last Dose  . amLODipine (NORVASC) 5 MG tablet Take 1 tablet (5 mg total) by mouth daily. 30 tablet 3 10/02/2015 at Unknown time  . atorvastatin (LIPITOR) 10 MG tablet Take 1 tablet (10 mg total) by mouth daily. 90 tablet 1 10/02/2015 at Unknown time  . carboxymethylcellulose (REFRESH TEARS) 0.5 % SOLN Place 1 drop into both eyes 3 (three) times daily as needed (dry eyes).    10/02/2015 at Unknown  time  . cholecalciferol (VITAMIN D) 1000 UNITS tablet Take 1,000 Units by mouth every morning.   10/02/2015 at Unknown time  . Glucosamine-Chondroitin 500-400 MG CAPS Take 1 capsule by mouth every morning.   10/02/2015 at Unknown time  . hydrochlorothiazide (HYDRODIURIL) 25 MG tablet take 1 tablet by mouth once daily 90 tablet 0 10/02/2015 at Unknown time  . levothyroxine (SYNTHROID, LEVOTHROID) 50 MCG tablet Take 1 tablet (50 mcg total) by mouth daily. 90 tablet 2 10/02/2015 at Unknown time  . metoprolol succinate (TOPROL-XL) 25 MG 24 hr tablet Take 1 tablet (25 mg total) by mouth daily. 180 tablet 1 10/02/2015 at 0800  . Multiple Vitamins-Minerals (CENTRUM SILVER PO) Take 1 tablet by mouth every morning.    10/02/2015 at Unknown time  . sildenafil (REVATIO) 20 MG tablet Take 60 mg by mouth daily as needed (erectile dusfunction).    unknown  . warfarin (COUMADIN) 2 MG tablet Take 1.5-2 tablets (3-4 mg total) by mouth daily. As directed by Coumadin Clinic (Patient taking differently: Take 3-4 mg by mouth daily. 4mg  on Sun, Tue, Thu, Sat.    3mg  MWF.) 150 tablet 1 10/02/2015 at Unknown time   Scheduled:  . amLODipine  5 mg Oral  Daily  . atorvastatin  10 mg Oral q1800  . hydrochlorothiazide  25 mg Oral Daily  . levothyroxine  50 mcg Oral QAC breakfast  . metoprolol succinate  25 mg Oral Daily    Assessment: 82 yo male here with afib/RVR. He is on coumadin PTA for afin and pharmacy consulted to dose.  -INR= 1.68  Home dose: 4mg  STTS, 3mg  MWF (last taken 6/1)  Goal of Therapy:  INR 2-3 Monitor platelets by anticoagulation protocol: Yes   Plan:  -Coumadin 6mg  po today -Daily PT/INR  Hildred Laser, Pharm D 10/03/2015 8:44 AM

## 2015-10-04 DIAGNOSIS — I10 Essential (primary) hypertension: Secondary | ICD-10-CM

## 2015-10-04 DIAGNOSIS — I4891 Unspecified atrial fibrillation: Secondary | ICD-10-CM | POA: Diagnosis not present

## 2015-10-04 DIAGNOSIS — N183 Chronic kidney disease, stage 3 (moderate): Secondary | ICD-10-CM

## 2015-10-04 DIAGNOSIS — I480 Paroxysmal atrial fibrillation: Secondary | ICD-10-CM | POA: Diagnosis not present

## 2015-10-04 LAB — CBC WITH DIFFERENTIAL/PLATELET
Basophils Absolute: 0 10*3/uL (ref 0.0–0.1)
Basophils Relative: 0 %
EOS ABS: 0.2 10*3/uL (ref 0.0–0.7)
Eosinophils Relative: 3 %
HEMATOCRIT: 44.1 % (ref 39.0–52.0)
HEMOGLOBIN: 14.8 g/dL (ref 13.0–17.0)
LYMPHS ABS: 1.2 10*3/uL (ref 0.7–4.0)
Lymphocytes Relative: 19 %
MCH: 29.5 pg (ref 26.0–34.0)
MCHC: 33.6 g/dL (ref 30.0–36.0)
MCV: 87.8 fL (ref 78.0–100.0)
MONO ABS: 0.8 10*3/uL (ref 0.1–1.0)
MONOS PCT: 12 %
NEUTROS PCT: 66 %
Neutro Abs: 4.3 10*3/uL (ref 1.7–7.7)
Platelets: 136 10*3/uL — ABNORMAL LOW (ref 150–400)
RBC: 5.02 MIL/uL (ref 4.22–5.81)
RDW: 13.2 % (ref 11.5–15.5)
WBC: 6.5 10*3/uL (ref 4.0–10.5)

## 2015-10-04 LAB — COMPREHENSIVE METABOLIC PANEL
ALK PHOS: 54 U/L (ref 38–126)
ALT: 19 U/L (ref 17–63)
ANION GAP: 10 (ref 5–15)
AST: 32 U/L (ref 15–41)
Albumin: 3.3 g/dL — ABNORMAL LOW (ref 3.5–5.0)
BILIRUBIN TOTAL: 1.4 mg/dL — AB (ref 0.3–1.2)
BUN: 28 mg/dL — ABNORMAL HIGH (ref 6–20)
CALCIUM: 9 mg/dL (ref 8.9–10.3)
CO2: 26 mmol/L (ref 22–32)
Chloride: 103 mmol/L (ref 101–111)
Creatinine, Ser: 1.29 mg/dL — ABNORMAL HIGH (ref 0.61–1.24)
GFR calc non Af Amer: 48 mL/min — ABNORMAL LOW (ref >60)
GFR, EST AFRICAN AMERICAN: 56 mL/min — AB (ref >60)
Glucose, Bld: 109 mg/dL — ABNORMAL HIGH (ref 65–99)
Potassium: 3.7 mmol/L (ref 3.5–5.1)
SODIUM: 139 mmol/L (ref 135–145)
TOTAL PROTEIN: 6 g/dL — AB (ref 6.5–8.1)

## 2015-10-04 LAB — PROTIME-INR
INR: 1.86 — ABNORMAL HIGH (ref 0.00–1.49)
Prothrombin Time: 21.3 seconds — ABNORMAL HIGH (ref 11.6–15.2)

## 2015-10-04 LAB — MAGNESIUM: Magnesium: 1.7 mg/dL (ref 1.7–2.4)

## 2015-10-04 MED ORDER — MAGNESIUM SULFATE IN D5W 1-5 GM/100ML-% IV SOLN
1.0000 g | Freq: Once | INTRAVENOUS | Status: AC
  Administered 2015-10-04: 1 g via INTRAVENOUS
  Filled 2015-10-04: qty 100

## 2015-10-04 MED ORDER — METOPROLOL SUCCINATE ER 25 MG PO TB24: 25.0000 mg | ORAL_TABLET | Freq: Three times a day (TID) | ORAL | Status: DC

## 2015-10-04 MED ORDER — WARFARIN SODIUM 1 MG PO TABS
1.0000 mg | ORAL_TABLET | Freq: Once | ORAL | Status: AC
  Administered 2015-10-04: 1 mg via ORAL
  Filled 2015-10-04: qty 1

## 2015-10-04 MED ORDER — METOPROLOL TARTRATE 5 MG/5ML IV SOLN
5.0000 mg | Freq: Once | INTRAVENOUS | Status: AC
  Administered 2015-10-04: 5 mg via INTRAVENOUS
  Filled 2015-10-04: qty 5

## 2015-10-04 MED ORDER — WARFARIN SODIUM 3 MG PO TABS
3.0000 mg | ORAL_TABLET | Freq: Once | ORAL | Status: AC
  Administered 2015-10-04: 3 mg via ORAL
  Filled 2015-10-04: qty 1

## 2015-10-04 MED ORDER — METOPROLOL TARTRATE 25 MG PO TABS
25.0000 mg | ORAL_TABLET | Freq: Three times a day (TID) | ORAL | Status: DC
  Administered 2015-10-04 – 2015-10-05 (×3): 25 mg via ORAL
  Filled 2015-10-04 (×3): qty 1

## 2015-10-04 MED ORDER — POTASSIUM CHLORIDE CRYS ER 20 MEQ PO TBCR
40.0000 meq | EXTENDED_RELEASE_TABLET | Freq: Once | ORAL | Status: AC
  Administered 2015-10-04: 40 meq via ORAL
  Filled 2015-10-04: qty 2

## 2015-10-04 NOTE — Progress Notes (Signed)
Patient ID: HARRIET WELL, male   DOB: 1925-07-08, 82 y.o.   MRN: [id number]    Patient Name: [name] Date of Encounter: 10/04/2015     Principal Problem:   Atrial fibrillation with RVR ([place]) Active Problems:   Hypothyroidism   HYPERTENSION, BENIGN   Polymyalgia rheumatica (HCC)   CKD (chronic kidney disease) stage 3, GFR 30-59 ml/min   NICM (nonischemic cardiomyopathy) (HCC)   Atrial fibrillation (HCC)    SUBJECTIVE  Dyspnea improved. Denies palpitations.  CURRENT MEDS . amLODipine  5 mg Oral Daily  . atorvastatin  10 mg Oral q1800  . hydrochlorothiazide  25 mg Oral Daily  . levothyroxine  50 mcg Oral QAC breakfast  . magnesium sulfate 1 - 4 g bolus IVPB  1 g Intravenous Once  . metoprolol succinate  25 mg Oral BID  . Warfarin - Pharmacist Dosing Inpatient   Does not apply q1800    OBJECTIVE  Filed Vitals:   10/03/15 1219 10/03/15 2005 10/03/15 2020 10/04/15 0500  BP: 117/82 140/75  124/108  Pulse: 130 50 88 135  Temp: 97.9 F (36.6 C) 98.1 F (36.7 C)  97.5 F (36.4 C)  TempSrc: Oral Oral  Oral  Resp: 17 18  18   Height:      Weight:    160 lb 6.4 oz (72.757 kg)  SpO2: 98% 99%  100%    Intake/Output Summary (Last 24 hours) at 10/04/15 1116 Last data filed at 10/03/15 1733  Gross per 24 hour  Intake    480 ml  Output      0 ml  Net    480 ml   Filed Weights   10/03/15 0358 10/04/15 0500  Weight: 167 lb 6.4 oz (75.932 kg) 160 lb 6.4 oz (72.757 kg)    PHYSICAL EXAM  General: Pleasant, elderly man, NAD. Neuro: Alert and oriented X 3. Moves all extremities spontaneously. Psych: Normal affect. HEENT:  Normal  Neck: Supple without bruits or JVD. Lungs:  Resp regular and unlabored, CTA. Heart: IRIRR no s3, s4, or murmurs. Abdomen: Soft, non-tender, non-distended, BS + x 4.  Extremities: No clubbing, cyanosis or edema. DP/PT/Radials 2+ and equal bilaterally.  Accessory Clinical Findings  CBC  Recent Labs  10/03/15 0852 10/04/15 0224  WBC  5.3 6.5  NEUTROABS 3.6 4.3  HGB 14.5 14.8  HCT 43.9 44.1  MCV 90.0 87.8  PLT 127* [id number]*   Basic Metabolic Panel  Recent Labs  10/03/15 0852 10/04/15 0224  NA 141 139  K 4.2 3.7  CL 104 103  CO2 32 26  GLUCOSE 149* 109*  BUN 27* 28*  CREATININE 1.28* 1.29*  CALCIUM 8.9 9.0  MG  --  1.7   Liver Function Tests  Recent Labs  10/03/15 0852 10/04/15 0224  AST 34 32  ALT 19 19  ALKPHOS 53 54  BILITOT 1.3* 1.4*  PROT 6.3* 6.0*  ALBUMIN 3.4* 3.3*   No results for input(s): LIPASE, AMYLASE in the last 72 hours. Cardiac Enzymes  Recent Labs  10/03/15 0852 10/03/15 1410 10/03/15 2001  TROPONINI 0.36* 0.31* 0.35*   BNP Invalid input(s): POCBNP D-Dimer No results for input(s): DDIMER in the last 72 hours. Hemoglobin A1C No results for input(s): HGBA1C in the last 72 hours. Fasting Lipid Panel No results for input(s): CHOL, HDL, LDLCALC, TRIG, CHOLHDL, LDLDIRECT in the last 72 hours. Thyroid Function Tests  Recent Labs  10/03/15 0852  TSH 3.245    TELE  Atrial fib with a CVR/RVR  Radiology/Studies  Dg Chest 2 View  10/02/2015  CLINICAL DATA:  Chest pain and shortness of breath. EXAM: CHEST  2 VIEW COMPARISON:  06/21/2013 FINDINGS: Cardiomegaly is unchanged from prior exam. Mediastinal contours are normal. Minimal central bronchitic change. No pulmonary edema, consolidation, pleural effusion or pneumothorax. IMPRESSION: Stable mild cardiomegaly and chronic bronchitic change. No superimposed acute process. Electronically Signed   By: Jeb Levering M.D.   On: 10/02/2015 23:08    ASSESSMENT AND PLAN  1. Acute on chronic diastolic heart failure - he is improved with diuresis. Continue. 2. Atrial fib with a RVR -he was started on metoprolol. We will uptitrate po metoprolol and give IV as well.  3. HTN - his blood pressure is well controlled. Will follow as we uptitrate beta blocker.  Gregg Taylor,M.D.  10/04/2015 11:16 AM

## 2015-10-04 NOTE — Progress Notes (Addendum)
ANTICOAGULATION CONSULT NOTE  Pharmacy Consult for coumadin Indication: atrial fibrillation  No Known Allergies  Patient Measurements: Height: 5\' 9"  (175.3 cm) Weight: 160 lb 6.4 oz (72.757 kg) IBW/kg (Calculated) : 70.7  Vital Signs: Temp: 97.5 F (36.4 C) (06/03 0500) Temp Source: Oral (06/03 0500) BP: 124/108 mmHg (06/03 0500) Pulse Rate: 135 (06/03 0500)  Labs:  Recent Labs  10/02/15 2155 10/02/15 2201 10/03/15 0852 10/03/15 1410 10/03/15 2001 10/04/15 0224  HGB 15.2  --  14.5  --   --  14.8  HCT 44.5  --  43.9  --   --  44.1  PLT 162  --  127*  --   --  136*  LABPROT  --  19.8*  --   --   --  21.3*  INR  --  1.68*  --   --   --  1.86*  CREATININE 1.44*  --  1.28*  --   --  1.29*  TROPONINI  --   --  0.36* 0.31* 0.35*  --     Estimated Creatinine Clearance: 40.3 mL/min (by C-G formula based on Cr of 1.29).   Scheduled:  . amLODipine  5 mg Oral Daily  . atorvastatin  10 mg Oral q1800  . hydrochlorothiazide  25 mg Oral Daily  . levothyroxine  50 mcg Oral QAC breakfast  . magnesium sulfate 1 - 4 g bolus IVPB  1 g Intravenous Once  . metoprolol succinate  25 mg Oral TID AC  . Warfarin - Pharmacist Dosing Inpatient   Does not apply q1800    Assessment: 82 yo male here with afib/RVR. He is on coumadin PTA for afin and pharmacy consulted to dose.  -INR= 1.86 (trend up)  Home dose: 4mg  STTS, 3mg  MWF (last taken 6/1)  Goal of Therapy:  INR 2-3 Monitor platelets by anticoagulation protocol: Yes   Plan:  -Coumadin 3mg  po today -Daily PT/INR  Hildred Laser, Pharm D 10/04/2015 11:25 AM  Addendum -Rn called and patient requests additonal coumadin tonight -attempted to explain INR trend up but patient would prefer a higher dose  Plan -1mg  coumadin for total of 4mg  tonight  Hildred Laser, Pharm D 10/04/2015 6:01 PM

## 2015-10-04 NOTE — Progress Notes (Signed)
[facility] Progress Note  Patient: [name] [id number]   PCP: [name], MD DOB: 1925/08/26   DOA: 10/02/2015   DOS: 10/04/2015   Date of Service: the patient was seen and examined on 10/04/2015  Subjective: Patient mentions is feeling better but continues to have palpitation. No chest pain or shortness of breath. No dizziness or lightheadedness. Nutrition: Tolerating oral diet  Brief hospital course: Patient was admitted on 10/02/2015, with complaint of fatigue, was found to have A. fib with RVR. Currently further plan is continue monitor with oral Lopressor.  Assessment and Plan: 1. Atrial fibrillation with RVR (HCC) Patient's heart rate continues to remain elevated overnight with range between 1:30 to 140. Patient will be given IV metoprolol 5 mg 1. Oral metoprolol dose increased to 25 mg 3 times a day per cardiology. We'll continue to monitor closely on telemetry. Continue Coumadin per pharmacy. INR remains subtherapeutic.  2. Hypothyroidism. Continue Synthroid.  3. Essential hypertension. Discontinue hydrochlorothiazide as well as amlodipine while the patient is receiving higher dose of metoprolol.  4. Chronic kidney disease stage III. Renal function stable. We'll continue to monitor.  Pain management: Tylenol when necessary Activity: Currently independent Bowel regimen: last BM 10/03/2015 Diet: Cardiac diet DVT Prophylaxis: therapeutic anticoagulation  Advance goals of care discussion: Full code  Family Communication: nofamily was present at bedside, at the time of interview.    Disposition:  Discharge to home, family Expected discharge date: 10/05/2015  Consultants: Cardiology Procedures: none  Antibiotics: Anti-infectives    None        Intake/Output Summary (Last 24 hours) at 10/04/15 1659 Last data filed at 10/03/15 1733  Gross per 24 hour  Intake    240 ml  Output      0 ml  Net    240 ml   Filed Weights   10/03/15 0358  10/04/15 0500  Weight: 75.932 kg (167 lb 6.4 oz) 72.757 kg (160 lb 6.4 oz)    Objective: Physical Exam: Filed Vitals:   10/03/15 2005 10/03/15 2020 10/04/15 0500 10/04/15 1446  BP: 140/75  124/108 123/69  Pulse: 50 88 135 68  Temp: 98.1 F (36.7 C)  97.5 F (36.4 C) 97.6 F (36.4 C)  TempSrc: Oral  Oral Oral  Resp: 18  18 18   Height:      Weight:   72.757 kg (160 lb 6.4 oz)   SpO2: 99%  100% 100%    General: Alert, Awake and Oriented to Time, Place and Person. Appear in mild distress Eyes: PERRL, Conjunctiva normal ENT: Oral Mucosa clear moist. Neck: no JVD, no Abnormal Mass Or lumps Cardiovascular: S1 and S2 Present, no Murmur, Peripheral Pulses Present Respiratory: Bilateral Air entry equal and Decreased, Clear to Auscultation, no Crackles, no wheezes Abdomen: Bowel Sound present, Soft and no tenderness Skin: no redness, on Rash  Extremities: no Pedal edema, no calf tenderness Neurologic: Grossly no focal neuro deficit. Bilaterally Equal motor strength  Data Reviewed: CBC:  Recent Labs Lab 10/02/15 2155 10/03/15 0852 10/04/15 0224  WBC 6.1 5.3 6.5  NEUTROABS  --  3.6 4.3  HGB 15.2 14.5 14.8  HCT 44.5 43.9 44.1  MCV 89.2 90.0 87.8  PLT 162 127* [id number]*   Basic Metabolic Panel:  Recent Labs Lab 10/02/15 2155 10/03/15 0852 10/04/15 0224  NA 135 141 139  K 3.7 4.2 3.7  CL 100* 104 103  CO2 27 32 26  GLUCOSE 182* 149* 109*  BUN 37* 27* 28*  CREATININE 1.44* 1.28* 1.29*  CALCIUM 9.2 8.9 9.0  MG  --   --  1.7    Liver Function Tests:  Recent Labs Lab 10/03/15 0852 10/04/15 0224  AST 34 32  ALT 19 19  ALKPHOS 53 54  BILITOT 1.3* 1.4*  PROT 6.3* 6.0*  ALBUMIN 3.4* 3.3*   No results for input(s): LIPASE, AMYLASE in the last 168 hours. No results for input(s): AMMONIA in the last 168 hours. Coagulation Profile:  Recent Labs Lab 09/30/15 1122 10/02/15 2201 10/04/15 0224  INR 1.6 1.68* 1.86*   Cardiac Enzymes:  Recent Labs Lab  10/03/15 0852 10/03/15 1410 10/03/15 2001  TROPONINI 0.36* 0.31* 0.35*   BNP (last 3 results) No results for input(s): PROBNP in the last 8760 hours.  CBG: No results for input(s): GLUCAP in the last 168 hours.  Studies: No results found.   Scheduled Meds: . amLODipine  5 mg Oral Daily  . atorvastatin  10 mg Oral q1800  . levothyroxine  50 mcg Oral QAC breakfast  . metoprolol tartrate  25 mg Oral TID WC  . warfarin  3 mg Oral ONCE-1800  . Warfarin - Pharmacist Dosing Inpatient   Does not apply q1800   Continuous Infusions:  PRN Meds: acetaminophen **OR** acetaminophen, ondansetron **OR** ondansetron (ZOFRAN) IV  Time spent: 30 minutes  Author: Berle Mull, MD [facility] Pager: [phone] 10/04/2015 4:59 PM  If 7PM-7AM, please contact night-coverage at [email or web address], password [facility]

## 2015-10-05 DIAGNOSIS — I480 Paroxysmal atrial fibrillation: Secondary | ICD-10-CM | POA: Diagnosis not present

## 2015-10-05 DIAGNOSIS — I4891 Unspecified atrial fibrillation: Secondary | ICD-10-CM | POA: Diagnosis not present

## 2015-10-05 LAB — COMPREHENSIVE METABOLIC PANEL
ALBUMIN: 3.5 g/dL (ref 3.5–5.0)
ALT: 19 U/L (ref 17–63)
AST: 33 U/L (ref 15–41)
Alkaline Phosphatase: 63 U/L (ref 38–126)
Anion gap: 8 (ref 5–15)
BUN: 33 mg/dL — AB (ref 6–20)
CALCIUM: 9.3 mg/dL (ref 8.9–10.3)
CO2: 30 mmol/L (ref 22–32)
Chloride: 101 mmol/L (ref 101–111)
Creatinine, Ser: 1.58 mg/dL — ABNORMAL HIGH (ref 0.61–1.24)
GFR calc non Af Amer: 38 mL/min — ABNORMAL LOW (ref >60)
GFR, EST AFRICAN AMERICAN: 44 mL/min — AB (ref >60)
GLUCOSE: 101 mg/dL — AB (ref 65–99)
Potassium: 3.8 mmol/L (ref 3.5–5.1)
Sodium: 139 mmol/L (ref 135–145)
Total Bilirubin: 1.4 mg/dL — ABNORMAL HIGH (ref 0.3–1.2)
Total Protein: 6.5 g/dL (ref 6.5–8.1)

## 2015-10-05 LAB — MAGNESIUM: Magnesium: 2 mg/dL (ref 1.7–2.4)

## 2015-10-05 LAB — CBC
HEMATOCRIT: 45.8 % (ref 39.0–52.0)
Hemoglobin: 15.1 g/dL (ref 13.0–17.0)
MCH: 29.2 pg (ref 26.0–34.0)
MCHC: 33 g/dL (ref 30.0–36.0)
MCV: 88.4 fL (ref 78.0–100.0)
Platelets: 146 10*3/uL — ABNORMAL LOW (ref 150–400)
RBC: 5.18 MIL/uL (ref 4.22–5.81)
RDW: 13.3 % (ref 11.5–15.5)
WBC: 5.2 10*3/uL (ref 4.0–10.5)

## 2015-10-05 LAB — PROTIME-INR
INR: 2.01 — AB (ref 0.00–1.49)
PROTHROMBIN TIME: 22.6 s — AB (ref 11.6–15.2)

## 2015-10-05 MED ORDER — WARFARIN SODIUM 4 MG PO TABS: 4.0000 mg | ORAL_TABLET | Freq: Once | ORAL | Status: DC

## 2015-10-05 MED ORDER — METOPROLOL SUCCINATE ER 25 MG PO TB24: 37.5000 mg | ORAL_TABLET | Freq: Two times a day (BID) | ORAL | Status: DC

## 2015-10-05 NOTE — Progress Notes (Signed)
Pt is very eager for discharge, has already removed leads and gotten dressed.  Awaiting clearance from cardiology, Dr. Lovena Le paged, he said he will be on the unit ASAP.

## 2015-10-05 NOTE — Progress Notes (Signed)
ANTICOAGULATION CONSULT NOTE  Pharmacy Consult for coumadin Indication: atrial fibrillation  No Known Allergies  Patient Measurements: Height: 5\' 9"  (175.3 cm) Weight: 158 lb 15.2 oz (72.1 kg) IBW/kg (Calculated) : 70.7  Vital Signs: Temp: 97.4 F (36.3 C) (06/04 0505) Temp Source: Oral (06/04 0505) BP: 131/86 mmHg (06/04 0505) Pulse Rate: 75 (06/04 0505)  Labs:  Recent Labs  10/02/15 2201 10/03/15 0852 10/03/15 1410 10/03/15 2001 10/04/15 0224 10/05/15 0228  HGB  --  14.5  --   --  14.8 15.1  HCT  --  43.9  --   --  44.1 45.8  PLT  --  127*  --   --  136* 146*  LABPROT 19.8*  --   --   --  21.3* 22.6*  INR 1.68*  --   --   --  1.86* 2.01*  CREATININE  --  1.28*  --   --  1.29* 1.58*  TROPONINI  --  0.36* 0.31* 0.35*  --   --     Estimated Creatinine Clearance: 32.9 mL/min (by C-G formula based on Cr of 1.58).   Scheduled:  . atorvastatin  10 mg Oral q1800  . levothyroxine  50 mcg Oral QAC breakfast  . metoprolol tartrate  25 mg Oral TID WC  . Warfarin - Pharmacist Dosing Inpatient   Does not apply q1800    Assessment: 82 yo male here with afib/RVR. He is on coumadin PTA for afib and pharmacy consulted to dose.  -INR=2.01 (trend up). Patient would like to stay on his home regimen if possible  Home dose: 4mg  STTS, 3mg  MWF (last taken 6/1)  Goal of Therapy:  INR 2-3 Monitor platelets by anticoagulation protocol: Yes   Plan:  -Coumadin 4mg  po today -Daily PT/INR; watch INR trend  Hildred Laser, Pharm D 10/05/2015 10:55 AM

## 2015-10-05 NOTE — Progress Notes (Signed)
Patient ID: MARTAE SAMUELSEN, male   DOB: April 15, 1926, 82 y.o.   MRN: [id number]    Patient Name: [name] Date of Encounter: 10/05/2015     Principal Problem:   Atrial fibrillation with RVR ([place]) Active Problems:   Hypothyroidism   HYPERTENSION, BENIGN   Polymyalgia rheumatica (HCC)   CKD (chronic kidney disease) stage 3, GFR 30-59 ml/min   NICM (nonischemic cardiomyopathy) (HCC)   Atrial fibrillation (HCC)    SUBJECTIVE  Dyspnea resolved. No palpitations.  CURRENT MEDS . atorvastatin  10 mg Oral q1800  . levothyroxine  50 mcg Oral QAC breakfast  . metoprolol tartrate  25 mg Oral TID WC  . warfarin  4 mg Oral ONCE-1800  . Warfarin - Pharmacist Dosing Inpatient   Does not apply q1800    OBJECTIVE  Filed Vitals:   10/04/15 1446 10/04/15 2108 10/05/15 0505 10/05/15 0509  BP: 123/69 119/77 131/86   Pulse: 68 78 75   Temp: 97.6 F (36.4 C) 98.2 F (36.8 C) 97.4 F (36.3 C)   TempSrc: Oral Oral Oral   Resp: 18 18 18    Height:      Weight:    158 lb 15.2 oz (72.1 kg)  SpO2: 100% 100% 99%    No intake or output data in the 24 hours ending 10/05/15 1211 Filed Weights   10/03/15 0358 10/04/15 0500 10/05/15 0509  Weight: 167 lb 6.4 oz (75.932 kg) 160 lb 6.4 oz (72.757 kg) 158 lb 15.2 oz (72.1 kg)    PHYSICAL EXAM  General: Pleasant, elderly man, NAD. Neuro: Alert and oriented X 3. Moves all extremities spontaneously. Psych: Normal affect. HEENT:  Normal  Neck: Supple without bruits or JVD. Lungs:  Resp regular and unlabored, CTA. Heart: iRIRR no s3, s4, or murmurs. Abdomen: Soft, non-tender, non-distended, BS + x 4.  Extremities: No clubbing, cyanosis or edema. DP/PT/Radials 2+ and equal bilaterally.  Accessory Clinical Findings  CBC  Recent Labs  10/03/15 0852 10/04/15 0224 10/05/15 0228  WBC 5.3 6.5 5.2  NEUTROABS 3.6 4.3  --   HGB 14.5 14.8 15.1  HCT 43.9 44.1 45.8  MCV 90.0 87.8 88.4  PLT 127* 136* [id number]*   Basic Metabolic Panel  Recent  Labs  10/04/15 0224 10/05/15 0228  NA 139 139  K 3.7 3.8  CL 103 101  CO2 26 30  GLUCOSE 109* 101*  BUN 28* 33*  CREATININE 1.29* 1.58*  CALCIUM 9.0 9.3  MG 1.7 2.0   Liver Function Tests  Recent Labs  10/04/15 0224 10/05/15 0228  AST 32 33  ALT 19 19  ALKPHOS 54 63  BILITOT 1.4* 1.4*  PROT 6.0* 6.5  ALBUMIN 3.3* 3.5   No results for input(s): LIPASE, AMYLASE in the last 72 hours. Cardiac Enzymes  Recent Labs  10/03/15 0852 10/03/15 1410 10/03/15 2001  TROPONINI 0.36* 0.31* 0.35*   BNP Invalid input(s): POCBNP D-Dimer No results for input(s): DDIMER in the last 72 hours. Hemoglobin A1C No results for input(s): HGBA1C in the last 72 hours. Fasting Lipid Panel No results for input(s): CHOL, HDL, LDLCALC, TRIG, CHOLHDL, LDLDIRECT in the last 72 hours. Thyroid Function Tests  Recent Labs  10/03/15 0852  TSH 3.245    TELE  Atrial fib with a controlled VR  Radiology/Studies  Dg Chest 2 View  10/02/2015  CLINICAL DATA:  Chest pain and shortness of breath. EXAM: CHEST  2 VIEW COMPARISON:  06/21/2013 FINDINGS: Cardiomegaly is unchanged from prior exam. Mediastinal contours are normal.  Minimal central bronchitic change. No pulmonary edema, consolidation, pleural effusion or pneumothorax. IMPRESSION: Stable mild cardiomegaly and chronic bronchitic change. No superimposed acute process. Electronically Signed   By: Jeb Levering M.D.   On: 10/02/2015 23:08    ASSESSMENT AND PLAN  1. Atrial fib - his rate is controlled. He will continue his beta blocker and a strategy of rate control. 2. Acute on chronic diastolic heart failure - his symptoms are well compensated. 3. HTN - his blood pressure is well controlled. Will follow 4. Disp. - discussed with Dr. Posey Pronto. He is stable for DC. He has f/u Dr. Renaldo Reel in 2 weeks.  Gregg Taylor,M.D.   Gregg Taylor,M.D.  10/05/2015 12:11 PM

## 2015-10-05 NOTE — Progress Notes (Signed)
Orders for discharge received.  IV removed.  Pt spouse en route to pick up.

## 2015-10-05 NOTE — Discharge Summary (Signed)
[facility] Discharge Summary   Patient: [name] [id number]   PCP: [name], MD DOB: [birth year]   Date of admission: 10/02/2015   Date of discharge: 10/05/2015     Discharge Diagnoses:  Principal Problem:   Atrial fibrillation with RVR ([place]) Active Problems:   Hypothyroidism   HYPERTENSION, BENIGN   Polymyalgia rheumatica (HCC)   CKD (chronic kidney disease) stage 3, GFR 30-59 ml/min   NICM (nonischemic cardiomyopathy) (HCC)   Atrial fibrillation ([place])  Recommendations for Outpatient Follow-up:  1. Please follow up with PCP in week with BMP and cardiology as scheduled   Follow-up Information    Follow up with [name], MD. Schedule an appointment as soon as possible for a visit in 1 week.   Specialty:  Family Medicine   Why:  with BMP,    Contact information:   [place] [address] [phone]       Follow up with [facility] Go on 10/07/2015.   Specialty:  Cardiology   Contact information:   [address], [place] [phone]      Follow up with Virl Axe, MD. Go on 10/16/2015.   Specialty:  Cardiology   Contact information:   [id number] [place] [address] [phone]      Diet recommendation: cardiac diet  Activity: The patient is advised to gradually reintroduce usual activities.  Discharge Condition: good  History of present illness: As per the H and P dictated on admission, "[name] is a 82 y.o. male with medical history significant of atrial fibrillation status post ablation, recurrent atrial flutter, cardiomyopathy nonischemic, hypothyroidism hyperlipidemia chronic kidney disease presents to ER after patient states he was not feeling well for last 2 weeks. Patient states last evening he checked his blood pressure and heart rate and was elevated. In the ER patient was found to be having heart rate around 150  beats per minute an EKG was showing possible SVT pattern. Patient's heart rate improved with vagal maneuvers and IV bolus. But subsequently went into A. fib with RVR. Patient's heart rate improved with metoprolol 5 mg IV but still was having a heart rate around 110 bpm with mildly elevated troponin. Denies any chest pain or shortness of breath had some poor appetite denies any nausea vomiting abdominal pain or diarrhea. Patient states he has not missed his medications. Has not had any fever or chills. "  Hospital Course:  Summary of his active problems in the hospital is as following. 1. Atrial fibrillation with RVR (HCC) Pt was given multiple IV metoprolol 5 mg for rate control. Oral metoprolol dose increased to 25 mg 3 times a day per cardiology. Discharged dose was adjusted to 37.5 mg BID.  Continue Coumadin per pharmacy.  2. Hypothyroidism. Continue Synthroid.  3. Essential hypertension. Metoprolol dose increased. Discontinue hydrochlorothiazide as well as amlodipine while the patient is receiving higher dose of metoprolol.  4. Chronic kidney disease stage III. Renal function stable. Pt need t drink more fluids and will need a repeat BMP in 1 week.  All other chronic medical condition were stable during the hospitalization.  Patient was ambulatory without any assistance. On the day of the discharge the patient's vitals were stable, and no other acute medical condition were reported by patient. the patient was felt safe to be discharge at home with family.  Procedures and Results:  none   Consultations:  Cardiology  DISCHARGE MEDICATION: Discharge Medication List as of 10/05/2015 12:34 PM    CONTINUE these medications which have CHANGED   Details  metoprolol succinate (TOPROL-XL) 25 MG 24 hr tablet Take 1.5 tablets (37.5 mg total) by mouth 2 (two) times daily., Starting 10/05/2015, Until Discontinued, No Print      CONTINUE these medications which have NOT CHANGED   Details    atorvastatin (LIPITOR) 10 MG tablet Take 1 tablet (10 mg total) by mouth daily., Starting 07/14/2015, Until Discontinued, Normal    carboxymethylcellulose (REFRESH TEARS) 0.5 % SOLN Place 1 drop into both eyes 3 (three) times daily as needed (dry eyes). , Until Discontinued, Historical Med    cholecalciferol (VITAMIN D) 1000 UNITS tablet Take 1,000 Units by mouth every morning., Until Discontinued, Historical Med    Glucosamine-Chondroitin 500-400 MG CAPS Take 1 capsule by mouth every morning., Until Discontinued, Historical Med    levothyroxine (SYNTHROID, LEVOTHROID) 50 MCG tablet Take 1 tablet (50 mcg total) by mouth daily., Starting 04/21/2015, Until Discontinued, Normal    Multiple Vitamins-Minerals (CENTRUM SILVER PO) Take 1 tablet by mouth every morning. , Until Discontinued, Historical Med    sildenafil (REVATIO) 20 MG tablet Take 60 mg by mouth daily as needed (erectile dusfunction). , Until Discontinued, Historical Med    warfarin (COUMADIN) 2 MG tablet Take 1.5-2 tablets (3-4 mg total) by mouth daily. As directed by [facility], Starting 07/14/2015, Until Discontinued, Normal      STOP taking these medications     amLODipine (NORVASC) 5 MG tablet      hydrochlorothiazide (HYDRODIURIL) 25 MG tablet        No Known Allergies Discharge Instructions    Diet - low sodium heart healthy    Complete by:  As directed      Discharge instructions    Complete by:  As directed   It is important that you read following instructions as well as go over your medication list with RN to help you understand your care after this hospitalization.  Discharge Instructions: Please follow-up with PCP in one week  Please request your primary care physician to go over all Hospital Tests and Procedure/Radiological results at the follow up,  Please get all Hospital records sent to your PCP by signing hospital release before you go home.   You were cared for by a hospitalist during your  hospital stay. If you have any questions about your discharge medications or the care you received while you were in the hospital after you are discharged, you can call the unit and ask to speak with the hospitalist on call if the hospitalist that took care of you is not available.  Once you are discharged, your primary care physician will handle any further medical issues. Please note that NO REFILLS for any discharge medications will be authorized once you are discharged, as it is imperative that you return to your primary care physician (or establish a relationship with a primary care physician if you do not have one) for your aftercare needs so that they can reassess your need for medications and monitor your lab values. You Must read complete instructions/literature along with all the possible adverse reactions/side effects for all the Medicines you take and that have been prescribed to you. Take any new Medicines after you have completely understood and accept all the possible adverse reactions/side effects. Wear Seat belts while driving.     Encourage fluids    Complete by:  As directed  Increase activity slowly    Complete by:  As directed           Discharge Exam: Filed Weights   10/03/15 0358 10/04/15 0500 10/05/15 0509  Weight: 75.932 kg (167 lb 6.4 oz) 72.757 kg (160 lb 6.4 oz) 72.1 kg (158 lb 15.2 oz)   Filed Vitals:   10/04/15 2108 10/05/15 0505  BP: 119/77 131/86  Pulse: 78 75  Temp: 98.2 F (36.8 C) 97.4 F (36.3 C)  Resp: 18 18   General: Appear in no distress, no Rash; Oral Mucosa moist. Cardiovascular: S1 and S2 Present, no Murmur, no JVD Respiratory: Bilateral Air entry present and Clear to Auscultation, no Crackles, no wheezes Abdomen: Bowel Sound present, Soft and no tenderness Extremities: no Pedal edema, no calf tenderness Neurology: Grossly no focal neuro deficit.  The results of significant diagnostics from this hospitalization (including imaging,  microbiology, ancillary and laboratory) are listed below for reference.    Significant Diagnostic Studies: Dg Chest 2 View  10/02/2015  CLINICAL DATA:  Chest pain and shortness of breath. EXAM: CHEST  2 VIEW COMPARISON:  06/21/2013 FINDINGS: Cardiomegaly is unchanged from prior exam. Mediastinal contours are normal. Minimal central bronchitic change. No pulmonary edema, consolidation, pleural effusion or pneumothorax. IMPRESSION: Stable mild cardiomegaly and chronic bronchitic change. No superimposed acute process. Electronically Signed   By: Jeb Levering M.D.   On: 10/02/2015 23:08    Microbiology: No results found for this or any previous visit (from the past 240 hour(s)).   Labs: CBC:  Recent Labs Lab 10/02/15 2155 10/03/15 0852 10/04/15 0224 10/05/15 0228  WBC 6.1 5.3 6.5 5.2  NEUTROABS  --  3.6 4.3  --   HGB 15.2 14.5 14.8 15.1  HCT 44.5 43.9 44.1 45.8  MCV 89.2 90.0 87.8 88.4  PLT 162 127* 136* 123456*   Basic Metabolic Panel:  Recent Labs Lab 10/02/15 2155 10/03/15 0852 10/04/15 0224 10/05/15 0228  NA 135 141 139 139  K 3.7 4.2 3.7 3.8  CL 100* 104 103 101  CO2 27 32 26 30  GLUCOSE 182* 149* 109* 101*  BUN 37* 27* 28* 33*  CREATININE 1.44* 1.28* 1.29* 1.58*  CALCIUM 9.2 8.9 9.0 9.3  MG  --   --  1.7 2.0   Liver Function Tests:  Recent Labs Lab 10/03/15 0852 10/04/15 0224 10/05/15 0228  AST 34 32 33  ALT 19 19 19   ALKPHOS 53 54 63  BILITOT 1.3* 1.4* 1.4*  PROT 6.3* 6.0* 6.5  ALBUMIN 3.4* 3.3* 3.5   No results for input(s): LIPASE, AMYLASE in the last 168 hours. No results for input(s): AMMONIA in the last 168 hours. Cardiac Enzymes:  Recent Labs Lab 10/03/15 0852 10/03/15 1410 10/03/15 2001  TROPONINI 0.36* 0.31* 0.35*   BNP (last 3 results) No results for input(s): BNP in the last 8760 hours. CBG: No results for input(s): GLUCAP in the last 168 hours. Time spent: 30 minutes  Signed:  PATEL, PRANAV  [facility] 10/05/2015 ,  11:16 PM

## 2015-10-07 ENCOUNTER — Encounter: Payer: Self-pay | Admitting: Family Medicine

## 2015-10-07 ENCOUNTER — Ambulatory Visit (INDEPENDENT_AMBULATORY_CARE_PROVIDER_SITE_OTHER): Payer: Medicare Other | Admitting: Family Medicine

## 2015-10-07 ENCOUNTER — Ambulatory Visit (INDEPENDENT_AMBULATORY_CARE_PROVIDER_SITE_OTHER): Payer: Medicare Other | Admitting: *Deleted

## 2015-10-07 VITALS — BP 148/78 | HR 50 | Temp 97.6°F | Ht 69.0 in | Wt 167.5 lb

## 2015-10-07 DIAGNOSIS — N183 Chronic kidney disease, stage 3 unspecified: Secondary | ICD-10-CM

## 2015-10-07 DIAGNOSIS — Z5181 Encounter for therapeutic drug level monitoring: Secondary | ICD-10-CM | POA: Diagnosis not present

## 2015-10-07 DIAGNOSIS — I4892 Unspecified atrial flutter: Secondary | ICD-10-CM | POA: Diagnosis not present

## 2015-10-07 DIAGNOSIS — I10 Essential (primary) hypertension: Secondary | ICD-10-CM | POA: Diagnosis not present

## 2015-10-07 DIAGNOSIS — I480 Paroxysmal atrial fibrillation: Secondary | ICD-10-CM | POA: Diagnosis not present

## 2015-10-07 LAB — BASIC METABOLIC PANEL
BUN: 38 mg/dL — ABNORMAL HIGH (ref 6–23)
CO2: 32 meq/L (ref 19–32)
Calcium: 9 mg/dL (ref 8.4–10.5)
Chloride: 102 mEq/L (ref 96–112)
Creatinine, Ser: 1.35 mg/dL (ref 0.40–1.50)
GFR: 53.05 mL/min — AB (ref >60.00)
Glucose, Bld: 94 mg/dL (ref 70–99)
POTASSIUM: 4.3 meq/L (ref 3.5–5.1)
SODIUM: 140 meq/L (ref 135–145)

## 2015-10-07 LAB — POCT INR: INR: 2.3

## 2015-10-07 NOTE — Progress Notes (Signed)
Pre visit review using our clinic review tool, if applicable. No additional management support is needed unless otherwise documented below in the visit note. 

## 2015-10-07 NOTE — Patient Instructions (Signed)
Hold HCTZ Take all other medications as prescribed. Follow up promptly for any recurrent shortness of breath or increased heart rate.

## 2015-10-07 NOTE — Progress Notes (Signed)
Subjective:    Patient ID: [name], male    DOB: 27-Apr-1926, 82 y.o.   MRN: [id number]  HPI  Patient seen for hospital follow-up. He has a long-standing history of atrial fibrillation and underwent ablation procedure back in 2007 at [facility] and has had multiple cardioversions. He was admitted on June 1 and discharged June 4. He states he been feeling somewhat poorly for couple weeks prior to admission and on the day of admission noted that his blood pressure was low and heart rate was up. He went to ER and had heart rate of 150 bpm with question of possible SVT pattern. Apparently vagal maneuvers were done and he subsequently went into atrial fibrillation with rapid ventricular response. Improved with IV metoprolol. Denied any chest pains.  Metoprolol was increased initially to 25 mg 3 times a day and at discharge 37.5 mg twice a day. TSH stable.  Chronic kidney disease stage III. Creatinine actually increased to 1.58 during his hospital stay. He was instructed to discontinue HCTZ and amlodipine at discharge though patient apparently did not understand these instructions and has continued his usual medications. Blood pressures have been very stable since discharge. Heart rate has been ranging between 48 and 70  Generally feels well overall. No dizziness. No dyspnea. No chest pains.  Past Medical History  Diagnosis Date  . AF (atrial fibrillation) [facility])     a. s/p RFCA/PVI @ [facility] clinic 2007.  Marland Kitchen Neoplasm of uncertain behavior     SKIN  . Hyperlipidemia   . Hypothyroidism   . HYPERTENSION, BENIGN 08/21/2009    Qualifier: Diagnosis of  By: Caryl Comes, MD, Remus Blake  Medications(s)  enalapril   . Polymyalgia rheumatica ([place]) 01/13/2007    Qualifier: History of  By: Danny Lawless CMA, [place]     . Arthritis     "joints" (06/21/2013)  . Atrial flutter ([place])     a. 06/2013 s/p TEE/DCCV  . Chronic systolic CHF (congestive heart failure) ([place])     a. 06/2013 Echo: EF 20%,  diff HK, mild LVH.  Marland Kitchen History of cardiovascular stress test     Lexiscan Myoview 8/16:  EF 59%, attenuation artifact, no ischemia; Low Risk   Past Surgical History  Procedure Laterality Date  . Hand surgery Right     "broke bone; grafted area w/bone from somewhere else"  . Cataract extraction, bilateral Bilateral Summer 2009  . Tonsillectomy    . Cholecystectomy    . Inguinal hernia repair Bilateral   . Atrial fibrillation ablation  2007  . Hemorrhoid surgery    . Tee without cardioversion N/A 06/22/2013    Procedure: TRANSESOPHAGEAL ECHOCARDIOGRAM (TEE);  Surgeon: Sueanne Margarita, MD;  Location: [place];  Service: Cardiovascular;  Laterality: N/A;  . Cardioversion N/A 06/22/2013    Procedure: CARDIOVERSION;  Surgeon: Sueanne Margarita, MD;  Location: [facility] ENDOSCOPY;  Service: Cardiovascular;  Laterality: N/A;  15:17 Lido 20mg ,IV , Propofol 30 mg, IV synched cardioversion @ 150 joules by Dr. Lajoyce Lauber from a flutter to sinis brady which is baseline for this pt, reports 45-50 reg HR verified by Dr. Ashok Norris  . Cardioversion N/A 11/21/2014    Procedure: CARDIOVERSION;  Surgeon: Fay Records, MD;  Location: [facility] ENDOSCOPY;  Service: Cardiovascular;  Laterality: N/A;  . Tee without cardioversion N/A 07/14/2015    Procedure: TRANSESOPHAGEAL ECHOCARDIOGRAM (TEE);  Surgeon: Sueanne Margarita, MD;  Location: [place];  Service: Cardiovascular;  Laterality: N/A;  . Cardioversion N/A 07/14/2015  Procedure: CARDIOVERSION;  Surgeon: Sueanne Margarita, MD;  Location: [facility] ENDOSCOPY;  Service: Cardiovascular;  Laterality: N/A;    reports that he quit smoking about 52 years ago. His smoking use included Cigarettes. He has a 12 pack-year smoking history. He has never used smokeless tobacco. He reports that he drinks alcohol. He reports that he does not use illicit drugs. family history includes Diabetes in his mother; Emphysema in his mother; Kidney disease in his father. No Known Allergies   Review of Systems    Constitutional: Negative for fever and chills.  Respiratory: Negative for cough and shortness of breath.   Cardiovascular: Negative for chest pain, palpitations and leg swelling.  Gastrointestinal: Negative for abdominal pain.  Neurological: Negative for dizziness and syncope.  Psychiatric/Behavioral: Negative for confusion.       Objective:   Physical Exam  Constitutional: He is oriented to person, place, and time. He appears well-developed and well-nourished.  Neck: Neck supple. No thyromegaly present.  Cardiovascular: Regular rhythm.   Palpated rate is 52  Pulmonary/Chest: Effort normal and breath sounds normal. No respiratory distress. He has no wheezes. He has no rales.  Musculoskeletal: He exhibits no edema.  Neurological: He is alert and oriented to person, place, and time.  Psychiatric: He has a normal mood and affect. His behavior is normal.          Assessment & Plan:  #1 history of recurrent atrial fibrillation. Recent recurrence of A. fib with rapid ventricular response. Rhythm is regular today and rate controlled. Recent increase in metoprolol dosage.  Has follow up with cardiology next week.  #2 hypertension. Stable by several home readings. He was instructed at discharge to discontinue amlodipine and HCTZ but he has continued with all his usual medications. We did instruct him to discontinue HCTZ. Will defer to cardiology whether he continues amlodipine.  #3 chronic kidney disease. Recheck basic metabolic panel.  He is encouraged to stay well-hydrated  [name] MD [facility]

## 2015-10-13 ENCOUNTER — Telehealth: Payer: Self-pay | Admitting: Internal Medicine

## 2015-10-13 NOTE — Telephone Encounter (Addendum)
I called and spoke with the patient. He states he was recently hospitalized for a-fib (6/1-6/4). He reports he was taken off of HCTZ and amlodipine at discharge. He did f/u with Dr. Elease Hashimoto on 10/07/15 and he was placed back on amlodipine. He is concerned as he is not sleeping well and his having frequent urination. His BP's are currently running 130's/70' w/ HR- 43-45 bpm. He denies dizziness/ lightheadedness. Metoprolol succinate was increased at discharge to 25 mg 1/ & 1/2 tablets (37.5mg ) by mouth twice daily. I advised I would forward to Dr. Caryl Comes to review prior to his appointment- issues to be addressed at office visit. He is agreeable.

## 2015-10-13 NOTE — Telephone Encounter (Signed)
Attempted to call the patient. Per the person answering his house phone, he is out until after 2 pm as he is at a meeting. I advised I will call back after 2 pm.

## 2015-10-13 NOTE — Telephone Encounter (Signed)
New message      Talk to the nurse regarding his appt on thurs with Dr Caryl Comes

## 2015-10-14 NOTE — Telephone Encounter (Signed)
Will await appt on thurdsya n

## 2015-10-16 ENCOUNTER — Encounter: Payer: Self-pay | Admitting: *Deleted

## 2015-10-16 ENCOUNTER — Ambulatory Visit (INDEPENDENT_AMBULATORY_CARE_PROVIDER_SITE_OTHER): Payer: Medicare Other | Admitting: *Deleted

## 2015-10-16 ENCOUNTER — Encounter: Payer: Self-pay | Admitting: Internal Medicine

## 2015-10-16 ENCOUNTER — Ambulatory Visit (INDEPENDENT_AMBULATORY_CARE_PROVIDER_SITE_OTHER): Payer: Medicare Other | Admitting: Internal Medicine

## 2015-10-16 VITALS — BP 160/80 | HR 47 | Ht 69.0 in | Wt 168.1 lb

## 2015-10-16 DIAGNOSIS — Z01812 Encounter for preprocedural laboratory examination: Secondary | ICD-10-CM | POA: Diagnosis not present

## 2015-10-16 DIAGNOSIS — Z5181 Encounter for therapeutic drug level monitoring: Secondary | ICD-10-CM | POA: Diagnosis not present

## 2015-10-16 DIAGNOSIS — I484 Atypical atrial flutter: Secondary | ICD-10-CM | POA: Diagnosis not present

## 2015-10-16 DIAGNOSIS — I4892 Unspecified atrial flutter: Secondary | ICD-10-CM | POA: Diagnosis not present

## 2015-10-16 DIAGNOSIS — I495 Sick sinus syndrome: Secondary | ICD-10-CM | POA: Diagnosis not present

## 2015-10-16 LAB — POCT INR: INR: 1.9

## 2015-10-16 NOTE — Progress Notes (Signed)
Patient Care Team: [name], MD as PCP - General (Family Medicine) [name], MD (Ophthalmology) [name], MD (Orthopedic Surgery) [name], MD (Cardiology) [name], MD (General Surgery)   HPI  [name] is a 82 y.o. male Seen in followup of atypical atrial flutter Echo >>EF 20% <<50% Repeat echo 5/15 demonstrated normal left ventricular function  Prior PVI ([facility]-2007)>> Rx amiodarone+warfarin >>>TEEDCCV   He had been seen in A. fib clinic on referral from the PA having been found in  atypical atrial flutter.  Cardioversion was arranged but in the interim he reverted to sinus rhythm  Recently hospitalized for  recurrent atypical atrial flutter which was actually distinct from the previous one  Has a history of bradycardia. He has less energy now that the beta blockers were initiated at the last hospitalization that had taken his heart rates in the 50s into the high 40s.  He is also strongly with nocturia. He was told about Hospital per his report to drink at night. He's getting up and urinating and now can't sleep.  Significant stressors related to his daughter and her psychiatric hospitalizations      Nuclear stress EF: 59%. Low risk stress nuclear study with a fixed basal inferoseptal perfusion defect and normal left ventricular regional and global systolic function. No reversible ischemia. The inferoseptal defect most likely represents an attenuation artifact.  Past Medical History  Diagnosis Date  . AF (atrial fibrillation) [facility])     a. s/p RFCA/PVI @ [facility] clinic 2007.  Marland Kitchen Neoplasm of uncertain behavior     SKIN  . Hyperlipidemia   . Hypothyroidism   . HYPERTENSION, BENIGN 08/21/2009    Qualifier: Diagnosis of  By: Caryl Comes, MD, Remus Blake  Medications(s)  enalapril   . Polymyalgia rheumatica ([place]) 01/13/2007    Qualifier: History of  By: Danny Lawless CMA, [place]     . Arthritis     "joints" (06/21/2013)    . Atrial flutter ([place])     a. 06/2013 s/p TEE/DCCV  . Chronic systolic CHF (congestive heart failure) ([place])     a. 06/2013 Echo: EF 20%, diff HK, mild LVH.  Marland Kitchen History of cardiovascular stress test     Lexiscan Myoview 8/16:  EF 59%, attenuation artifact, no ischemia; Low Risk    Past Surgical History  Procedure Laterality Date  . Hand surgery Right     "broke bone; grafted area w/bone from somewhere else"  . Cataract extraction, bilateral Bilateral Summer 2009  . Tonsillectomy    . Cholecystectomy    . Inguinal hernia repair Bilateral   . Atrial fibrillation ablation  2007  . Hemorrhoid surgery    . Tee without cardioversion N/A 06/22/2013    Procedure: TRANSESOPHAGEAL ECHOCARDIOGRAM (TEE);  Surgeon: Sueanne Margarita, MD;  Location: [place];  Service: Cardiovascular;  Laterality: N/A;  . Cardioversion N/A 06/22/2013    Procedure: CARDIOVERSION;  Surgeon: Sueanne Margarita, MD;  Location: [facility] ENDOSCOPY;  Service: Cardiovascular;  Laterality: N/A;  15:17 Lido 20mg ,IV , Propofol 30 mg, IV synched cardioversion @ 150 joules by Dr. Lajoyce Lauber from a flutter to sinis brady which is baseline for this pt, reports 45-50 reg HR verified by Dr. Ashok Norris  . Cardioversion N/A 11/21/2014    Procedure: CARDIOVERSION;  Surgeon: Fay Records, MD;  Location: [facility] ENDOSCOPY;  Service: Cardiovascular;  Laterality: N/A;  . Tee without cardioversion N/A 07/14/2015    Procedure: TRANSESOPHAGEAL ECHOCARDIOGRAM (TEE);  Surgeon:  Sueanne Margarita, MD;  Location: [place];  Service: Cardiovascular;  Laterality: N/A;  . Cardioversion N/A 07/14/2015    Procedure: CARDIOVERSION;  Surgeon: Sueanne Margarita, MD;  Location: [place] ENDOSCOPY;  Service: Cardiovascular;  Laterality: N/A;    Current Outpatient Prescriptions  Medication Sig Dispense Refill  . amLODipine (NORVASC) 5 MG tablet Take 5 mg by mouth daily.    Marland Kitchen atorvastatin (LIPITOR) 10 MG tablet Take 1 tablet (10 mg total) by mouth daily. 90 tablet 1  .  carboxymethylcellulose (REFRESH TEARS) 0.5 % SOLN Place 1 drop into both eyes 3 (three) times daily as needed (dry eyes).     . cholecalciferol (VITAMIN D) 1000 UNITS tablet Take 1,000 Units by mouth every morning.    . Glucosamine-Chondroitin 500-400 MG CAPS Take 1 capsule by mouth every morning.    Marland Kitchen levothyroxine (SYNTHROID, LEVOTHROID) 50 MCG tablet Take 1 tablet (50 mcg total) by mouth daily. 90 tablet 2  . metoprolol succinate (TOPROL-XL) 25 MG 24 hr tablet Take 1.5 tablets (37.5 mg total) by mouth 2 (two) times daily. 180 tablet 0  . Multiple Vitamins-Minerals (CENTRUM SILVER PO) Take 1 tablet by mouth every morning.     . sildenafil (REVATIO) 20 MG tablet Take 60 mg by mouth daily as needed (erectile dusfunction).     . warfarin (COUMADIN) 2 MG tablet Take 1.5-2 tablets (3-4 mg total) by mouth daily. As directed by Coumadin Clinic (Patient taking differently: Take 3-4 mg by mouth daily. 4mg  on Sun, Tue, Thu, Sat.    3mg  MWF.) 150 tablet 1   No current facility-administered medications for this visit.    No Known Allergies  Review of Systems negative except from HPI and PMH  Physical Exam BP 160/80 mmHg  Pulse 47  Ht 5\' 9"  (1.753 m)  Wt 168 lb 1.9 oz (76.259 kg)  BMI 24.82 kg/m2 Well developed and well nourished in no acute distress HENT normal E scleral and icterus clear Neck Supple JVP flat; carotids brisk and full Clear to ausculation  *Regular rate and rhythm, no murmurs gallops or rub Soft with active bowel sounds; ecchymosis No clubbing cyanosis none Edema Alert and oriented, grossly normal motor and sensory function Skin Warm and Dry  ECG  Sinus rhythm at 52 Intervals 15/10/39  Assessment and  Plan  Atrial Flutter/  Perhaps some intercurrent but nothing sustained  sinus bradycardia symptomatic  NICM thought to be rate related  Resolved  Dyspnea on exertion  Hypertension.  nocturia     We discussed treatment strategies including rhythm contr the  issue with his flutter is I think is that they are related to his ablation. There were 2 different flutter circuits based on his ECG. I am not optimistic that a class III drug will be sufficiently effective to close off the refractory  tail. I think he will need a sodium channel blocker. These will all require augmented drugs that will affect his sinus node and hence we will need to proceed with pacing given his current symptomatic tachycardia Aggravated by the need to use his beta blocker for his recurrent rapid atrial flutter    I suggested the use of Breathe Right strips as well as to stop drinking at night

## 2015-10-16 NOTE — Patient Instructions (Addendum)
Medication Instructions:  Your physician recommends that you continue on your current medications as directed. Please refer to the Current Medication list given to you today.  Labwork: Your physician recommends that you return for pre procedure lab work during the week of 7/3 - 7/7  Testing/Procedures: Your physician has recommended that you have a pacemaker inserted. A pacemaker is a small device that is placed under the skin of your chest or abdomen to help control abnormal heart rhythms. This device uses electrical pulses to prompt the heart to beat at a normal rate. Pacemakers are used to treat heart rhythms that are too slow. Wire (leads) are attached to the pacemaker that goes into the chambers of you heart. This is done in the hospital and usually requires and overnight stay. Please see the instruction sheet given to you today for more information.  Follow-Up: Your physician recommends that you schedule a follow-up appointment in: 10-14 days, after your procedure on 11/07/15, with device clinic for a wound check.  Your physician recommends that you schedule a follow-up appointment in: 3 months, after your procedure on 11/07/15, with Dr. Caryl Comes.   If you need a refill on your cardiac medications before your next appointment, please call your pharmacy.   Thank you for choosing [facility]!!  Any Other Special Instructions Will Be Listed Below (If Applicable).  - please review Preparing for Surgery sheet given to you today about how to properly use surgical scrub before the procedure.   Pacemaker Implantation The heart has its own electrical system, or natural pacemaker, to regulate the heartbeat. Sometimes, the natural pacemaker system of the heart fails and causes the heart to beat too slowly. If this happens, a pacemaker can be surgically placed to help the heart beat at a normal or programmed rate. A pacemaker is a small, battery-powered device that is placed under the skin and is  programmed to sense your heartbeats. If your heart rate is lower than the programmed rate, the pacemaker will pace your heart. Parts of a pacemaker include:  Wires or leads. The leads are placed in the heart and transmit electricity to the heart. The leads are connected to the pulse generator.  Pulse generator. The pulse generator contains a computer and a memory system. The pulse generator also produces the electrical signal that triggers the heart to beat. A pacemaker may be placed if:  You have a slow heartbeat (bradycardia).  You have fainting (syncope).  Shortness of breath (dyspnea) due to heart problems. LET [facility] CARE PROVIDER KNOW ABOUT:  Any allergies you may have.  All medicines you are taking, including vitamins, herbs, eye drops, creams, and over-the-counter medicines.  Previous problems you or members of your family have had with the use of anesthetics.  Any blood disorders you have.  Previous surgeries you have had.  Medical conditions you have.  Possibility of pregnancy, if this applies. RISKS AND COMPLICATIONS Generally, pacemaker implantation is a safe procedure. However, problems can occur and include:  Bleeding.  Unable to place the pacemaker under local sedation.  Infection. BEFORE THE PROCEDURE  You will have blood work drawn before the procedure.  Do not use any tobacco products including cigarettes, chewing tobacco, or electronic cigarettes. If you need help quitting, ask your health care provider.  Do not eat or drink anything after midnight on the night before the procedure or as directed by your health care provider.  Ask your health care provider about:  Changing or stopping your regular medicines.  This is especially important if you are taking diabetes medicines or blood thinners.  Taking medicines such as aspirin and ibuprofen. These medicines can thin your blood. Do not take these medicines before your procedure if your health care  provider asks you not to.  Ask your health care provider if you can take a sip of water with any approved medicines the morning of the procedure. PROCEDURE  The surgery to place a pacemaker is considered a minimally invasive surgical procedure. It is done under a local anesthetic, which is an injection at the incision site that makes the skin numb. You are also given sedation and pain medicine that makes you drowsy during the procedure.   An intravenous line (IV) will be started in your hand or arm so sedation and pain medicine can be given during the pacemaker procedure.  A numbing medicine will be injected into the skin where the pacemaker is to be placed. A small incision will then be made into the skin. The pacemaker is usually placed under the skin near the collarbone.  After the incision has been made, the leads will be inserted into a large vein and guided into the heart using X-ray.  Using the same incision that was used to place the leads, a small pocket will be created under the skin to hold the pulse generator. The leads will then be connected to the pulse generator.  The incision site will then be closed. A bandage (dressing) is placed over the pacemaker site. The dressing is removed 24-48 hours afterward. AFTER THE PROCEDURE  You will be taken to a recovery area after the pacemaker implant. Your vital signs such as blood pressure, heart rate, breathing, and oxygen levels will be monitored.  A chest X-ray will be done after the pacemaker has been implanted. This is to make sure the pacemaker and leads are in the correct place.   This information is not intended to replace advice given to you by your health care provider. Make sure you discuss any questions you have with your health care provider.   Document Released: 04/09/2002 Document Revised: 05/10/2014 Document Reviewed: 08/24/2011 Elsevier Interactive Patient Education [employer].

## 2015-10-22 NOTE — Addendum Note (Signed)
Addended by: Freada Bergeron on: 10/22/2015 03:46 PM   Modules accepted: Orders

## 2015-10-24 ENCOUNTER — Telehealth: Payer: Self-pay | Admitting: Internal Medicine

## 2015-10-24 NOTE — Telephone Encounter (Signed)
New message     Pt is due to have a pacemaker implant procedure.  He has questions to ask

## 2015-10-24 NOTE — Telephone Encounter (Signed)
I called and spoke with the patient. He states that he is due to see his urologist in about 6 weeks, but is having trouble with frequent urination. He will drink water at night and be up about every hour. If he does not drink water he is up about every 1 & 1/2 hours and wakes up with dry mouth. He wanted to Dr. Caryl Comes to be aware as he is concerned that he may urinate on the procedure table.  He is also complaining of not being able to sleep well since his metoprolol was increased at his last office visit with Dr. Caryl Comes. He is currently taking metoprolol succinate 75 mg BID. I advised him I would review his concerns with Dr. Caryl Comes next week and call him back.  He will have pre-procedure labs on 7/6- INR will be addressed at that time- this was his other concern.

## 2015-10-26 NOTE — Telephone Encounter (Signed)
i am not sure how he came to increase his metoprolol to 75 bid-- he was bradycardic on 37.5 bid He whould probably be on 25 bid As related to frequent urination, he may be best served by calling his urologist and seeing what can be done. For dry mouth, is he usingbreath right strips?

## 2015-10-28 ENCOUNTER — Ambulatory Visit (INDEPENDENT_AMBULATORY_CARE_PROVIDER_SITE_OTHER): Payer: Medicare Other | Admitting: Pharmacist

## 2015-10-28 DIAGNOSIS — Z5181 Encounter for therapeutic drug level monitoring: Secondary | ICD-10-CM

## 2015-10-28 DIAGNOSIS — I4892 Unspecified atrial flutter: Secondary | ICD-10-CM

## 2015-10-28 DIAGNOSIS — I484 Atypical atrial flutter: Secondary | ICD-10-CM | POA: Diagnosis not present

## 2015-10-28 LAB — POCT INR: INR: 1.9

## 2015-10-28 MED ORDER — METOPROLOL SUCCINATE ER 25 MG PO TB24: 25.0000 mg | ORAL_TABLET | Freq: Two times a day (BID) | ORAL | Status: DC

## 2015-10-28 NOTE — Telephone Encounter (Signed)
Pt came to clinic for Coumadin check today.  Was discussing HR and metoprolol dose.  HR in clinic was 61.  He is taking 1 1/2 tablets daily but he does not know what milligram the tablet is.  Per our records, should be 25mg  tablet.  Instructed him to decrease to 1 tablet BID (25mg  BID) per Dr. Caryl Comes note below.

## 2015-11-02 ENCOUNTER — Other Ambulatory Visit (HOSPITAL_COMMUNITY): Payer: Self-pay | Admitting: Nurse Practitioner

## 2015-11-05 ENCOUNTER — Encounter (HOSPITAL_COMMUNITY): Payer: Self-pay | Admitting: Pharmacy Technician

## 2015-11-06 ENCOUNTER — Other Ambulatory Visit (INDEPENDENT_AMBULATORY_CARE_PROVIDER_SITE_OTHER): Payer: Medicare Other

## 2015-11-06 DIAGNOSIS — Z01812 Encounter for preprocedural laboratory examination: Secondary | ICD-10-CM

## 2015-11-06 DIAGNOSIS — I495 Sick sinus syndrome: Secondary | ICD-10-CM

## 2015-11-06 LAB — BASIC METABOLIC PANEL
BUN: 26 mg/dL — AB (ref 7–25)
CHLORIDE: 105 mmol/L (ref 98–110)
CO2: 30 mmol/L (ref 20–31)
Calcium: 9 mg/dL (ref 8.6–10.3)
Creat: 1.31 mg/dL — ABNORMAL HIGH (ref 0.70–1.11)
GLUCOSE: 116 mg/dL — AB (ref 65–99)
POTASSIUM: 5.3 mmol/L (ref 3.5–5.3)
Sodium: 142 mmol/L (ref 135–146)

## 2015-11-06 LAB — CBC WITH DIFFERENTIAL/PLATELET
BASOS PCT: 0 %
Basophils Absolute: 0 cells/uL (ref 0–200)
EOS ABS: 132 {cells}/uL (ref 15–500)
Eosinophils Relative: 3 %
HEMATOCRIT: 39 % (ref 38.5–50.0)
HEMOGLOBIN: 13.3 g/dL (ref 13.2–17.1)
LYMPHS ABS: 792 {cells}/uL — AB (ref 850–3900)
LYMPHS PCT: 18 %
MCH: 30.6 pg (ref 27.0–33.0)
MCHC: 34.1 g/dL (ref 32.0–36.0)
MCV: 89.7 fL (ref 80.0–100.0)
MONO ABS: 440 {cells}/uL (ref 200–950)
MPV: 12 fL (ref 7.5–12.5)
Monocytes Relative: 10 %
NEUTROS PCT: 69 %
Neutro Abs: 3036 cells/uL (ref 1500–7800)
Platelets: 146 10*3/uL (ref 140–400)
RBC: 4.35 MIL/uL (ref 4.20–5.80)
RDW: 14.8 % (ref 11.0–15.0)
WBC: 4.4 10*3/uL (ref 3.8–10.8)

## 2015-11-06 LAB — PROTIME-INR
INR: 1.8 — ABNORMAL HIGH
Prothrombin Time: 18.6 s — ABNORMAL HIGH (ref 9.0–11.5)

## 2015-11-07 ENCOUNTER — Telehealth: Payer: Self-pay | Admitting: Internal Medicine

## 2015-11-07 NOTE — Telephone Encounter (Signed)
New message   Pt is calling because he is confused on his instructions for his Cath   He needs rn to call him and go over medications

## 2015-11-07 NOTE — Telephone Encounter (Signed)
Pt is scheduled for pacer placement on Monday.  He is calling because he is concerned about his medications and how to take them prior to his procedure.  His instruction sheet does not cover this information.  Reviewed with Trinidad Curet, RN who called to ask Dr Caryl Comes.  Per Dr Caryl Comes, pt is to continue medications as listed.  He is not to hold or stop anything.  Pt is aware and states understanding.  He was thankful for the call and information.

## 2015-11-10 ENCOUNTER — Encounter (HOSPITAL_COMMUNITY)
Admission: RE | Disposition: A | Discharge status: Home / Self Care | Payer: Self-pay | Source: Ambulatory Visit | Attending: Internal Medicine

## 2015-11-10 ENCOUNTER — Ambulatory Visit (HOSPITAL_COMMUNITY)
Admission: RE | Admit: 2015-11-10 | Discharge: 2015-11-11 | Disposition: A | Discharge status: Home / Self Care | Payer: Medicare Other | Source: Ambulatory Visit | Attending: Internal Medicine | Admitting: Internal Medicine

## 2015-11-10 ENCOUNTER — Encounter (HOSPITAL_COMMUNITY): Payer: Self-pay | Admitting: Internal Medicine

## 2015-11-10 DIAGNOSIS — I4892 Unspecified atrial flutter: Secondary | ICD-10-CM | POA: Diagnosis not present

## 2015-11-10 DIAGNOSIS — E039 Hypothyroidism, unspecified: Secondary | ICD-10-CM | POA: Insufficient documentation

## 2015-11-10 DIAGNOSIS — Z8719 Personal history of other diseases of the digestive system: Secondary | ICD-10-CM | POA: Insufficient documentation

## 2015-11-10 DIAGNOSIS — M1990 Unspecified osteoarthritis, unspecified site: Secondary | ICD-10-CM | POA: Diagnosis not present

## 2015-11-10 DIAGNOSIS — I4891 Unspecified atrial fibrillation: Secondary | ICD-10-CM | POA: Insufficient documentation

## 2015-11-10 DIAGNOSIS — Z7901 Long term (current) use of anticoagulants: Secondary | ICD-10-CM | POA: Insufficient documentation

## 2015-11-10 DIAGNOSIS — I110 Hypertensive heart disease with heart failure: Secondary | ICD-10-CM | POA: Diagnosis not present

## 2015-11-10 DIAGNOSIS — I495 Sick sinus syndrome: Secondary | ICD-10-CM | POA: Insufficient documentation

## 2015-11-10 DIAGNOSIS — M353 Polymyalgia rheumatica: Secondary | ICD-10-CM | POA: Insufficient documentation

## 2015-11-10 DIAGNOSIS — R351 Nocturia: Secondary | ICD-10-CM | POA: Diagnosis not present

## 2015-11-10 DIAGNOSIS — N529 Male erectile dysfunction, unspecified: Secondary | ICD-10-CM | POA: Insufficient documentation

## 2015-11-10 DIAGNOSIS — I428 Other cardiomyopathies: Secondary | ICD-10-CM | POA: Diagnosis not present

## 2015-11-10 DIAGNOSIS — I484 Atypical atrial flutter: Secondary | ICD-10-CM

## 2015-11-10 DIAGNOSIS — I5022 Chronic systolic (congestive) heart failure: Secondary | ICD-10-CM | POA: Diagnosis not present

## 2015-11-10 DIAGNOSIS — Z959 Presence of cardiac and vascular implant and graft, unspecified: Secondary | ICD-10-CM

## 2015-11-10 DIAGNOSIS — E785 Hyperlipidemia, unspecified: Secondary | ICD-10-CM | POA: Insufficient documentation

## 2015-11-10 HISTORY — PX: EP IMPLANTABLE DEVICE: SHX172B

## 2015-11-10 LAB — SURGICAL PCR SCREEN
MRSA, PCR: NEGATIVE
Staphylococcus aureus: NEGATIVE

## 2015-11-10 LAB — PROTIME-INR
INR: 2.08 — AB (ref 0.00–1.49)
Prothrombin Time: 23.3 seconds — ABNORMAL HIGH (ref 11.6–15.2)

## 2015-11-10 SURGERY — PACEMAKER IMPLANT

## 2015-11-10 MED ORDER — WARFARIN SODIUM 2.5 MG PO TABS: 4.5000 mg | ORAL_TABLET | Freq: Every day | ORAL | Status: DC

## 2015-11-10 MED ORDER — VITAMIN D 1000 UNITS PO TABS
1000.0000 [IU] | ORAL_TABLET | Freq: Every morning | ORAL | Status: DC
  Administered 2015-11-10: 1000 [IU] via ORAL
  Filled 2015-11-10: qty 1

## 2015-11-10 MED ORDER — LIDOCAINE HCL (PF) 1 % IJ SOLN
INTRAMUSCULAR | Status: AC
  Filled 2015-11-10: qty 60

## 2015-11-10 MED ORDER — MIDAZOLAM HCL 5 MG/5ML IJ SOLN
INTRAMUSCULAR | Status: AC
  Filled 2015-11-10: qty 5

## 2015-11-10 MED ORDER — ACETAMINOPHEN 325 MG PO TABS: 325.0000 mg | ORAL_TABLET | ORAL | Status: DC | PRN

## 2015-11-10 MED ORDER — ATORVASTATIN CALCIUM 10 MG PO TABS
10.0000 mg | ORAL_TABLET | Freq: Every day | ORAL | Status: DC
  Administered 2015-11-10: 10 mg via ORAL
  Filled 2015-11-10: qty 1

## 2015-11-10 MED ORDER — LIDOCAINE HCL (PF) 1 % IJ SOLN
INTRAMUSCULAR | Status: DC | PRN
  Administered 2015-11-10: 34 mL via INTRADERMAL

## 2015-11-10 MED ORDER — AMLODIPINE BESYLATE 5 MG PO TABS: 5.0000 mg | ORAL_TABLET | Freq: Every day | ORAL | Status: DC

## 2015-11-10 MED ORDER — MUPIROCIN 2 % EX OINT
TOPICAL_OINTMENT | CUTANEOUS | Status: AC
  Filled 2015-11-10: qty 22

## 2015-11-10 MED ORDER — WARFARIN SODIUM 4 MG PO TABS: 4.0000 mg | ORAL_TABLET | ORAL | Status: DC

## 2015-11-10 MED ORDER — HEPARIN (PORCINE) IN NACL 2-0.9 UNIT/ML-% IJ SOLN
INTRAMUSCULAR | Status: AC
  Filled 2015-11-10: qty 500

## 2015-11-10 MED ORDER — MUPIROCIN 2 % EX OINT
1.0000 "application " | TOPICAL_OINTMENT | Freq: Once | CUTANEOUS | Status: AC
  Administered 2015-11-10: 1 via TOPICAL

## 2015-11-10 MED ORDER — MIDAZOLAM HCL 5 MG/5ML IJ SOLN
INTRAMUSCULAR | Status: DC | PRN
  Administered 2015-11-10 (×2): 1 mg via INTRAVENOUS

## 2015-11-10 MED ORDER — POLYVINYL ALCOHOL 1.4 % OP SOLN: 1.0000 [drp] | Freq: Three times a day (TID) | OPHTHALMIC | Status: DC | PRN

## 2015-11-10 MED ORDER — VITAMIN D 1000 UNITS PO TABS: 1000.0000 [IU] | ORAL_TABLET | Freq: Every morning | ORAL | Status: DC

## 2015-11-10 MED ORDER — WARFARIN SODIUM 1 MG PO TABS: 1.0000 mg | ORAL_TABLET | ORAL | Status: DC

## 2015-11-10 MED ORDER — HEPARIN (PORCINE) IN NACL 2-0.9 UNIT/ML-% IJ SOLN
INTRAMUSCULAR | Status: DC | PRN
  Administered 2015-11-10: 10:00:00

## 2015-11-10 MED ORDER — SILDENAFIL CITRATE 20 MG PO TABS: 60.0000 mg | ORAL_TABLET | Freq: Every day | ORAL | Status: DC | PRN

## 2015-11-10 MED ORDER — METOPROLOL SUCCINATE ER 25 MG PO TB24
25.0000 mg | ORAL_TABLET | Freq: Two times a day (BID) | ORAL | Status: DC
  Administered 2015-11-10: 25 mg via ORAL
  Filled 2015-11-10: qty 1

## 2015-11-10 MED ORDER — SODIUM CHLORIDE 0.9 % IV SOLN
INTRAVENOUS | Status: AC
  Administered 2015-11-10: 13:00:00 via INTRAVENOUS

## 2015-11-10 MED ORDER — FENTANYL CITRATE (PF) 100 MCG/2ML IJ SOLN
INTRAMUSCULAR | Status: DC | PRN
  Administered 2015-11-10: 12.5 ug via INTRAVENOUS

## 2015-11-10 MED ORDER — CEFAZOLIN SODIUM-DEXTROSE 2-4 GM/100ML-% IV SOLN
INTRAVENOUS | Status: AC
  Filled 2015-11-10: qty 100

## 2015-11-10 MED ORDER — ONDANSETRON HCL 4 MG/2ML IJ SOLN: 4.0000 mg | Freq: Four times a day (QID) | INTRAMUSCULAR | Status: DC | PRN

## 2015-11-10 MED ORDER — AMLODIPINE BESYLATE 5 MG PO TABS
5.0000 mg | ORAL_TABLET | Freq: Every day | ORAL | Status: DC
  Administered 2015-11-11: 5 mg via ORAL
  Filled 2015-11-10: qty 1

## 2015-11-10 MED ORDER — ATORVASTATIN CALCIUM 10 MG PO TABS: 10.0000 mg | ORAL_TABLET | Freq: Every day | ORAL | Status: DC

## 2015-11-10 MED ORDER — METOPROLOL SUCCINATE ER 25 MG PO TB24: 25.0000 mg | ORAL_TABLET | Freq: Two times a day (BID) | ORAL | Status: DC

## 2015-11-10 MED ORDER — SODIUM CHLORIDE 0.9 % IV SOLN
INTRAVENOUS | Status: DC
  Administered 2015-11-10: 08:00:00 via INTRAVENOUS

## 2015-11-10 MED ORDER — SODIUM CHLORIDE 0.9 % IR SOLN
Status: AC
  Filled 2015-11-10: qty 2

## 2015-11-10 MED ORDER — FENTANYL CITRATE (PF) 100 MCG/2ML IJ SOLN
INTRAMUSCULAR | Status: AC
  Filled 2015-11-10: qty 2

## 2015-11-10 MED ORDER — CEFAZOLIN IN D5W 1 GM/50ML IV SOLN
1.0000 g | Freq: Four times a day (QID) | INTRAVENOUS | Status: AC
Start: 2015-11-10 — End: 2015-11-11
  Administered 2015-11-10 – 2015-11-11 (×3): 1 g via INTRAVENOUS
  Filled 2015-11-10 (×3): qty 50

## 2015-11-10 MED ORDER — GLUCOSAMINE-CHONDROITIN 500-400 MG PO CAPS: 1.0000 | ORAL_CAPSULE | Freq: Every morning | ORAL | Status: DC

## 2015-11-10 MED ORDER — LEVOTHYROXINE SODIUM 50 MCG PO TABS
50.0000 ug | ORAL_TABLET | Freq: Every day | ORAL | Status: DC
  Administered 2015-11-11 (×2): 50 ug via ORAL
  Filled 2015-11-10 (×2): qty 1

## 2015-11-10 MED ORDER — WARFARIN SODIUM 2 MG PO TABS
3.0000 mg | ORAL_TABLET | ORAL | Status: DC
  Administered 2015-11-10: 3 mg via ORAL
  Filled 2015-11-10: qty 1

## 2015-11-10 MED ORDER — WARFARIN - PHARMACIST DOSING INPATIENT: Freq: Every day | Status: DC

## 2015-11-10 MED ORDER — SODIUM CHLORIDE 0.9 % IR SOLN
80.0000 mg | Status: AC
  Administered 2015-11-10: 80 mg

## 2015-11-10 MED ORDER — CEFAZOLIN SODIUM-DEXTROSE 2-4 GM/100ML-% IV SOLN
2.0000 g | INTRAVENOUS | Status: AC
  Administered 2015-11-10: 2 g via INTRAVENOUS

## 2015-11-10 MED ORDER — WARFARIN - PHYSICIAN DOSING INPATIENT: Freq: Every day | Status: DC

## 2015-11-10 SURGICAL SUPPLY — 12 items
CABLE SURGICAL S-101-97-12 (CABLE) ×2 IMPLANT
CATH RIGHTSITE C315HIS02 (CATHETERS) ×2 IMPLANT
HEMOSTAT SURGICEL 2X4 FIBR (HEMOSTASIS) ×2 IMPLANT
LEAD CAPSURE NOVUS 5076-52CM (Lead) ×2 IMPLANT
LEAD SELECT SECURE 3830 383069 (Lead) IMPLANT
PAD DEFIB LIFELINK (PAD) ×2 IMPLANT
PPM ADVISA MRI DR A2DR01 (Pacemaker) ×2 IMPLANT
SELECT SECURE 3830 383069 (Lead) ×3 IMPLANT
SHEATH CLASSIC 7F (SHEATH) ×4 IMPLANT
SLITTER 6232ADJ (MISCELLANEOUS) ×2 IMPLANT
TRAY PACEMAKER INSERTION (PACKS) ×2 IMPLANT
WIRE HI TORQ VERSACORE-J 145CM (WIRE) ×2 IMPLANT

## 2015-11-10 NOTE — Interval H&P Note (Signed)
History and Physical Interval Note:  11/10/2015 9:29 AM  [name]  has presented today for surgery, with the diagnosis of snd  The various methods of treatment have been discussed with the patient and family. After consideration of risks, benefits and other options for treatment, the patient has consented to  Procedure(s): Pacemaker Implant (N/A) as a surgical intervention .  The patient's history has been reviewed, patient examined, no change in status, stable for surgery.  I have reviewed the patient's chart and labs.  Questions were answered to the patient's satisfaction.     Virl Axe

## 2015-11-10 NOTE — Progress Notes (Signed)
Pt left chest within defined limits. Bruising present no bleeding or drainage noted. Site dermabond, sling to left arm. Pt aware of site care. No reports of pain per pt. Will continue to monitor pt. 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

## 2015-11-10 NOTE — Progress Notes (Signed)
PHARMACIST - PHYSICIAN ORDER COMMUNICATION  CONCERNING: P&T Medication Policy on Herbal Medications  DESCRIPTION:  This patient's order for:  Glucosamine-Chondroitin  has been noted.  This product(s) is classified as an "herbal" or natural product. Due to a lack of definitive safety studies or FDA approval, nonstandard manufacturing practices, plus the potential risk of unknown drug-drug interactions while on inpatient medications, the Pharmacy and Therapeutics Committee does not permit the use of "herbal" or natural products of this type within [facility].   ACTION TAKEN: The pharmacy department is unable to verify this order at this time and your patient has been informed of this safety policy. Please reevaluate patient's clinical condition at discharge and address if the herbal or natural product(s) should be resumed at that time.   Also, have stopped Sildenafil as pt uses for erectile dysfunction.  Thanks,  Gracy Bruins, PharmD Clinical Pharmacist [facility]

## 2015-11-10 NOTE — Discharge Instructions (Signed)
° ° °  Supplemental Discharge Instructions for  Pacemaker/Defibrillator Patients  Activity No heavy lifting or vigorous activity with your left/right arm for 6 to 8 weeks.  Do not raise your left/right arm above your head for one week.  Gradually raise your affected arm as drawn below.            11/14/15                      11/15/15                    11/16/15                   11/17/15 __  NO DRIVING for  1 week   ; you may begin driving on  [id number]   .  WOUND CARE - Keep the wound area clean and dry.  Do not get this area wet for 24 hours. No showers for 24 hours; you may shower on 11/11/15 evening    . - The tape/steri-strips on your wound will fall off; do not pull them off.  No bandage is needed on the site.  DO  NOT apply any creams, oils, or ointments to the wound area. - If you notice any drainage or discharge from the wound, any swelling or bruising at the site, or you develop a fever > 101? F after you are discharged home, call the office at once.  Special Instructions - You are still able to use cellular telephones; use the ear opposite the side where you have your pacemaker/defibrillator.  Avoid carrying your cellular phone near your device. - When traveling through airports, show security personnel your identification card to avoid being screened in the metal detectors.  Ask the security personnel to use the hand wand. - Avoid arc welding equipment, MRI testing (magnetic resonance imaging), TENS units (transcutaneous nerve stimulators).  Call the office for questions about other devices. - Avoid electrical appliances that are in poor condition or are not properly grounded. - Microwave ovens are safe to be near or to operate.  Additional information for defibrillator patients should your device go off: - If your device goes off ONCE and you feel fine afterward, notify the device clinic nurses. - If your device goes off ONCE and you do not feel well afterward, call 911. - If your  device goes off TWICE, call 911. - If your device goes off THREE times in one day, call 911.  DO NOT DRIVE YOURSELF OR A FAMILY MEMBER WITH A DEFIBRILLATOR TO THE HOSPITAL--CALL 911.

## 2015-11-10 NOTE — Interval H&P Note (Signed)
History and Physical Interval Note:  11/10/2015 8:21 AM  [name]  has presented today for surgery, with the diagnosis of snd  The various methods of treatment have been discussed with the patient and family. After consideration of risks, benefits and other options for treatment, the patient has consented to  Procedure(s): Pacemaker Implant (N/A) as a surgical intervention .  The patient's history has been reviewed, patient examined, no change in status, stable for surgery.  I have reviewed the patient's chart and labs.  Questions were answered to the patient's satisfaction.     Virl Axe

## 2015-11-10 NOTE — H&P (View-Only) (Signed)
Patient Care Team: [name], MD as PCP - General (Family Medicine) [name], MD (Ophthalmology) [name], MD (Orthopedic Surgery) [name], MD (Cardiology) [name], MD (General Surgery)   HPI  [name] is a 82 y.o. male Seen in followup of atypical atrial flutter Echo >>EF 20% <<50% Repeat echo 5/15 demonstrated normal left ventricular function  Prior PVI ([facility]-2007)>> Rx amiodarone+warfarin >>>TEEDCCV   He had been seen in A. fib clinic on referral from the PA having been found in  atypical atrial flutter.  Cardioversion was arranged but in the interim he reverted to sinus rhythm  Recently hospitalized for  recurrent atypical atrial flutter which was actually distinct from the previous one  Has a history of bradycardia. He has less energy now that the beta blockers were initiated at the last hospitalization that had taken his heart rates in the 50s into the high 40s.  He is also strongly with nocturia. He was told about Hospital per his report to drink at night. He's getting up and urinating and now can't sleep.  Significant stressors related to his daughter and her psychiatric hospitalizations      Nuclear stress EF: 59%. Low risk stress nuclear study with a fixed basal inferoseptal perfusion defect and normal left ventricular regional and global systolic function. No reversible ischemia. The inferoseptal defect most likely represents an attenuation artifact.  Past Medical History  Diagnosis Date  . AF (atrial fibrillation) [facility])     a. s/p RFCA/PVI @ [facility] clinic 2007.  Marland Kitchen Neoplasm of uncertain behavior     SKIN  . Hyperlipidemia   . Hypothyroidism   . HYPERTENSION, BENIGN 08/21/2009    Qualifier: Diagnosis of  By: Caryl Comes, MD, Remus Blake  Medications(s)  enalapril   . Polymyalgia rheumatica ([place]) 01/13/2007    Qualifier: History of  By: Danny Lawless CMA, [place]     . Arthritis     "joints" (06/21/2013)    . Atrial flutter ([place])     a. 06/2013 s/p TEE/DCCV  . Chronic systolic CHF (congestive heart failure) ([place])     a. 06/2013 Echo: EF 20%, diff HK, mild LVH.  Marland Kitchen History of cardiovascular stress test     Lexiscan Myoview 8/16:  EF 59%, attenuation artifact, no ischemia; Low Risk    Past Surgical History  Procedure Laterality Date  . Hand surgery Right     "broke bone; grafted area w/bone from somewhere else"  . Cataract extraction, bilateral Bilateral Summer 2009  . Tonsillectomy    . Cholecystectomy    . Inguinal hernia repair Bilateral   . Atrial fibrillation ablation  2007  . Hemorrhoid surgery    . Tee without cardioversion N/A 06/22/2013    Procedure: TRANSESOPHAGEAL ECHOCARDIOGRAM (TEE);  Surgeon: Sueanne Margarita, MD;  Location: [place];  Service: Cardiovascular;  Laterality: N/A;  . Cardioversion N/A 06/22/2013    Procedure: CARDIOVERSION;  Surgeon: Sueanne Margarita, MD;  Location: [facility] ENDOSCOPY;  Service: Cardiovascular;  Laterality: N/A;  15:17 Lido 20mg ,IV , Propofol 30 mg, IV synched cardioversion @ 150 joules by Dr. Lajoyce Lauber from a flutter to sinis brady which is baseline for this pt, reports 45-50 reg HR verified by Dr. Ashok Norris  . Cardioversion N/A 11/21/2014    Procedure: CARDIOVERSION;  Surgeon: Fay Records, MD;  Location: [facility] ENDOSCOPY;  Service: Cardiovascular;  Laterality: N/A;  . Tee without cardioversion N/A 07/14/2015    Procedure: TRANSESOPHAGEAL ECHOCARDIOGRAM (TEE);  Surgeon:  Sueanne Margarita, MD;  Location: [place];  Service: Cardiovascular;  Laterality: N/A;  . Cardioversion N/A 07/14/2015    Procedure: CARDIOVERSION;  Surgeon: Sueanne Margarita, MD;  Location: [place] ENDOSCOPY;  Service: Cardiovascular;  Laterality: N/A;    Current Outpatient Prescriptions  Medication Sig Dispense Refill  . amLODipine (NORVASC) 5 MG tablet Take 5 mg by mouth daily.    Marland Kitchen atorvastatin (LIPITOR) 10 MG tablet Take 1 tablet (10 mg total) by mouth daily. 90 tablet 1  .  carboxymethylcellulose (REFRESH TEARS) 0.5 % SOLN Place 1 drop into both eyes 3 (three) times daily as needed (dry eyes).     . cholecalciferol (VITAMIN D) 1000 UNITS tablet Take 1,000 Units by mouth every morning.    . Glucosamine-Chondroitin 500-400 MG CAPS Take 1 capsule by mouth every morning.    Marland Kitchen levothyroxine (SYNTHROID, LEVOTHROID) 50 MCG tablet Take 1 tablet (50 mcg total) by mouth daily. 90 tablet 2  . metoprolol succinate (TOPROL-XL) 25 MG 24 hr tablet Take 1.5 tablets (37.5 mg total) by mouth 2 (two) times daily. 180 tablet 0  . Multiple Vitamins-Minerals (CENTRUM SILVER PO) Take 1 tablet by mouth every morning.     . sildenafil (REVATIO) 20 MG tablet Take 60 mg by mouth daily as needed (erectile dusfunction).     . warfarin (COUMADIN) 2 MG tablet Take 1.5-2 tablets (3-4 mg total) by mouth daily. As directed by Coumadin Clinic (Patient taking differently: Take 3-4 mg by mouth daily. 4mg  on Sun, Tue, Thu, Sat.    3mg  MWF.) 150 tablet 1   No current facility-administered medications for this visit.    No Known Allergies  Review of Systems negative except from HPI and PMH  Physical Exam BP 160/80 mmHg  Pulse 47  Ht 5\' 9"  (1.753 m)  Wt 168 lb 1.9 oz (76.259 kg)  BMI 24.82 kg/m2 Well developed and well nourished in no acute distress HENT normal E scleral and icterus clear Neck Supple JVP flat; carotids brisk and full Clear to ausculation  *Regular rate and rhythm, no murmurs gallops or rub Soft with active bowel sounds; ecchymosis No clubbing cyanosis none Edema Alert and oriented, grossly normal motor and sensory function Skin Warm and Dry  ECG  Sinus rhythm at 52 Intervals 15/10/39  Assessment and  Plan  Atrial Flutter/  Perhaps some intercurrent but nothing sustained  sinus bradycardia symptomatic  NICM thought to be rate related  Resolved  Dyspnea on exertion  Hypertension.  nocturia     We discussed treatment strategies including rhythm contr the  issue with his flutter is I think is that they are related to his ablation. There were 2 different flutter circuits based on his ECG. I am not optimistic that a class III drug will be sufficiently effective to close off the refractory  tail. I think he will need a sodium channel blocker. These will all require augmented drugs that will affect his sinus node and hence we will need to proceed with pacing given his current symptomatic tachycardia Aggravated by the need to use his beta blocker for his recurrent rapid atrial flutter    I suggested the use of Breathe Right strips as well as to stop drinking at night

## 2015-11-10 NOTE — Discharge Summary (Signed)
ELECTROPHYSIOLOGY PROCEDURE DISCHARGE SUMMARY    Patient ID: [name],  MRN: [id number], DOB/AGE: May 20, 1925 82 y.o.  Admit date: 11/10/2015 Discharge date: 11/11/15  Primary Care Physician: [name], MD Primary Cardiologist: Dr. Caryl Comes  Primary Discharge Diagnosis:  1. Symptomatic bradycardia/tachy-brady syndrome     status post pacemaker implantation this admission  Secondary Discharge Diagnosis:  1. PAFlutter (atypical)     CHA2DS2Vasc is at least 4 on warfarin, monitored and managed with comadin clinic     Next lab scheduled 11/19/15 2. NICM         Improved LVEF 3. HTN  No Known Allergies   Procedures This Admission:  1.  Implantation of a MDT dual chamber PPM on 11/10/15 by Dr Caryl Comes.  The patient received a Medtronic MRI compatible pulse generator serial number [id number], with RV lead,Medtronic MRI compatible [id number] active fixation lead serial number [id number] in the HIS position, andMedtronic MRI compatible right atrial lead serial number [id number]. There were no immediate post procedure complications. 2.  CXR on 11/11/15 demonstrated no pneumothorax status post device implantation.   Brief HPI: [name] is a 82 y.o. male was referred to electrophysiology in the outpatient setting for consideration of PPM implantation.  Past medical history includes PAFlutter, HTN.  The patient has had symptomatic bradycardia  and tachycardia.  Risks, benefits, and alternatives to PPM implantation were reviewed with the patient who wished to proceed.   Hospital Course:  The patient was admitted and underwent implantation of a PPM with details as outlined above. He was monitored on telemetry overnight which demonstrated AV pacing.  Left chest was without hematoma or ecchymosis.  The device was interrogated and found to be functioning normally.  CXR was obtained and demonstrated no pneumothorax status post device implantation.  Wound care, arm mobility, and  restrictions were reviewed with the patient.  The patient was examined by Dr. Caryl Comes and considered stable for discharge to home.    Physical Exam: Filed Vitals:   11/10/15 1311 11/10/15 1543 11/10/15 2021 11/11/15 0500  BP:  107/89 158/76 151/82  Pulse:  60 60   Temp: 97.6 F (36.4 C) 97.6 F (36.4 C) 98.3 F (36.8 C) 98 F (36.7 C)  TempSrc: Oral Oral Oral Oral  Resp:  18 16 18   Height: 5\' 9"  (1.753 m)     Weight: 163 lb (73.936 kg)   163 lb 12.8 oz (74.299 kg)  SpO2:  99% 99% 98%    GEN- The patient is well appearing, alert and oriented x 3 today.   HEENT: normocephalic, atraumatic; sclera clear, conjunctiva pink; hearing intact;  neck supple, no JVP Lungs- Clear to ausculation bilaterally, normal work of breathing.  No wheezes, rales, rhonchi Heart- Regular rate and rhythm, no murmurs, rubs or gallops, PMI not laterally displaced GI- soft, non-tender, non-distended Extremities- no clubbing, cyanosis, or edema MS- no significant deformity, age appropriate atrophy Skin- warm and dry, no rash or lesion, left chest without hematoma, minimal ecchymosis Psych- euthymic mood, full affect Neuro- no gross deficits   Labs:   Lab Results  Component Value Date   WBC 4.4 11/06/2015   HGB 13.3 11/06/2015   HCT 39.0 11/06/2015   MCV 89.7 11/06/2015   PLT 146 11/06/2015     Recent Labs Lab 11/06/15 1045  NA 142  K 5.3  CL 105  CO2 30  BUN 26*  CREATININE 1.31*  CALCIUM 9.0  GLUCOSE 116*    Discharge Medications:    Medication  List    TAKE these medications        amLODipine 5 MG tablet  Commonly known as:  NORVASC  take 1 tablet by mouth once daily     atorvastatin 10 MG tablet  Commonly known as:  LIPITOR  Take 1 tablet (10 mg total) by mouth daily.     CENTRUM SILVER PO  Take 1 tablet by mouth every morning.     cholecalciferol 1000 units tablet  Commonly known as:  VITAMIN D  Take 1,000 Units by mouth every morning.     Glucosamine-Chondroitin  500-400 MG Caps  Take 1 capsule by mouth every morning.     levothyroxine 50 MCG tablet  Commonly known as:  SYNTHROID, LEVOTHROID  Take 1 tablet (50 mcg total) by mouth daily.     metoprolol succinate 25 MG 24 hr tablet  Commonly known as:  TOPROL-XL  Take 3 tablets (75 mg total) by mouth daily.     REFRESH TEARS 0.5 % Soln  Generic drug:  carboxymethylcellulose  Place 1 drop into both eyes 3 (three) times daily as needed (dry eyes).     sildenafil 20 MG tablet  Commonly known as:  REVATIO  Take 60 mg by mouth daily as needed (erectile dusfunction).     warfarin 2 MG tablet  Commonly known as:  COUMADIN  Take 1-4 mg by mouth See admin instructions. Take 4mg  ( two of the 2mg  tab) every day except on Mon, and Friday take half of the 1mg  per patient     warfarin 2 MG tablet  Commonly known as:  COUMADIN  Take 1.5-2 tablets (3-4 mg total) by mouth daily. As directed by Coumadin Clinic        Disposition:  Home Discharge Instructions    Diet - low sodium heart healthy    Complete by:  As directed      Increase activity slowly    Complete by:  As directed           Follow-up Information    Follow up with [facility] On 11/19/2015.   Specialty:  Cardiology   Why:  11:30AM, wound check   Contact information:   [address] [place] [address] [phone]      Follow up with Virl Axe, MD On 01/23/2016.   Specialty:  Cardiology   Why:  11:45AM   Contact information:   [address] [place] [address] [phone]       Follow up with [facility] Office On 11/19/2015.   Specialty:  Cardiology   Why:  11:15AM, coumadin clinic/lab   Contact information:   [address], [employer] [phone]      Duration of Discharge Encounter: Greater than 30 minutes including physician time.  Venetia Night, PA-C 11/11/2015 8:54 AM

## 2015-11-11 ENCOUNTER — Ambulatory Visit (HOSPITAL_COMMUNITY): Payer: Medicare Other

## 2015-11-11 DIAGNOSIS — Z7901 Long term (current) use of anticoagulants: Secondary | ICD-10-CM | POA: Diagnosis not present

## 2015-11-11 DIAGNOSIS — I5022 Chronic systolic (congestive) heart failure: Secondary | ICD-10-CM | POA: Diagnosis not present

## 2015-11-11 DIAGNOSIS — I4891 Unspecified atrial fibrillation: Secondary | ICD-10-CM | POA: Diagnosis not present

## 2015-11-11 DIAGNOSIS — M1990 Unspecified osteoarthritis, unspecified site: Secondary | ICD-10-CM | POA: Diagnosis not present

## 2015-11-11 DIAGNOSIS — E785 Hyperlipidemia, unspecified: Secondary | ICD-10-CM | POA: Diagnosis not present

## 2015-11-11 DIAGNOSIS — I495 Sick sinus syndrome: Secondary | ICD-10-CM | POA: Diagnosis not present

## 2015-11-11 DIAGNOSIS — I4892 Unspecified atrial flutter: Secondary | ICD-10-CM | POA: Diagnosis not present

## 2015-11-11 DIAGNOSIS — M353 Polymyalgia rheumatica: Secondary | ICD-10-CM | POA: Diagnosis not present

## 2015-11-11 DIAGNOSIS — E039 Hypothyroidism, unspecified: Secondary | ICD-10-CM | POA: Diagnosis not present

## 2015-11-11 DIAGNOSIS — I110 Hypertensive heart disease with heart failure: Secondary | ICD-10-CM | POA: Diagnosis not present

## 2015-11-11 DIAGNOSIS — Z950 Presence of cardiac pacemaker: Secondary | ICD-10-CM | POA: Diagnosis not present

## 2015-11-11 DIAGNOSIS — R351 Nocturia: Secondary | ICD-10-CM | POA: Diagnosis not present

## 2015-11-11 DIAGNOSIS — I428 Other cardiomyopathies: Secondary | ICD-10-CM | POA: Diagnosis not present

## 2015-11-11 LAB — PROTIME-INR
INR: 2.05 — AB (ref 0.00–1.49)
PROTHROMBIN TIME: 23 s — AB (ref 11.6–15.2)

## 2015-11-11 MED ORDER — METOPROLOL SUCCINATE ER 50 MG PO TB24
75.0000 mg | ORAL_TABLET | Freq: Every day | ORAL | Status: DC
  Administered 2015-11-11: 75 mg via ORAL
  Filled 2015-11-11: qty 1

## 2015-11-11 MED ORDER — METOPROLOL SUCCINATE ER 25 MG PO TB24: 75.0000 mg | ORAL_TABLET | Freq: Every day | ORAL | Status: DC

## 2015-11-11 MED FILL — Sodium Chloride Irrigation Soln 0.9%: Qty: 500 | Status: AC

## 2015-11-11 MED FILL — Gentamicin Sulfate Inj 40 MG/ML: INTRAMUSCULAR | Qty: 2 | Status: AC

## 2015-11-11 MED FILL — Cefazolin Sodium-Dextrose IV Solution 2 GM/100ML-4%: INTRAVENOUS | Qty: 100 | Status: AC

## 2015-11-19 ENCOUNTER — Ambulatory Visit (INDEPENDENT_AMBULATORY_CARE_PROVIDER_SITE_OTHER): Payer: Medicare Other | Admitting: *Deleted

## 2015-11-19 ENCOUNTER — Encounter: Payer: Self-pay | Admitting: Internal Medicine

## 2015-11-19 DIAGNOSIS — I4892 Unspecified atrial flutter: Secondary | ICD-10-CM

## 2015-11-19 DIAGNOSIS — I495 Sick sinus syndrome: Secondary | ICD-10-CM | POA: Diagnosis not present

## 2015-11-19 DIAGNOSIS — Z5181 Encounter for therapeutic drug level monitoring: Secondary | ICD-10-CM

## 2015-11-19 LAB — POCT INR: INR: 2.1

## 2015-11-19 NOTE — Progress Notes (Signed)
Wound check appointment. Dermabond removed. Wound without redness or edema. Incision edges approximated, wound well healed. Normal device function. Thresholds, sensing, and impedances consistent with implant measurements. Device programmed at 3.5V programmed on for extra safety margin until 3 month visit. Histogram distribution appropriate for patient and level of activity. No mode switches or high ventricular rates noted. Patient educated about wound care, arm mobility, lifting restrictions. ROV 02/16/2016 with SK.

## 2015-11-25 ENCOUNTER — Ambulatory Visit (INDEPENDENT_AMBULATORY_CARE_PROVIDER_SITE_OTHER): Payer: Medicare Other | Admitting: Family Medicine

## 2015-11-25 ENCOUNTER — Encounter: Payer: Self-pay | Admitting: Family Medicine

## 2015-11-25 VITALS — BP 142/80 | HR 76 | Temp 98.1°F | Ht 69.0 in | Wt 168.9 lb

## 2015-11-25 DIAGNOSIS — E291 Testicular hypofunction: Secondary | ICD-10-CM

## 2015-11-25 DIAGNOSIS — I480 Paroxysmal atrial fibrillation: Secondary | ICD-10-CM | POA: Diagnosis not present

## 2015-11-25 DIAGNOSIS — I10 Essential (primary) hypertension: Secondary | ICD-10-CM

## 2015-11-25 NOTE — Progress Notes (Signed)
Subjective:     Patient ID: [name], male   DOB: March 07, 1926, 82 y.o.   MRN: [id number]  HPI Patient seen for medical follow-up  Hypertension treated with metoprolol 50 mg one and one half tablets daily and amlodipine. Compliant with therapy. Blood pressures have been consistently well controlled by home readings. No recent dizziness.  History of atrial fibrillation. He had pacemaker implant recently and has done well since then. No palpitations. No chest pains.  Urine frequency. No obstructive urinary symptoms. He has appointment scheduled see urology. No burning with urination.  History of low testosterone. Patient had been on testosterone placement but hematocrit started climbing this past March of over 57. We discontinued testosterone. He is inquiring about whether re-starting is an option at this point. We explained he would be at ongoing risk for polycythemia  Past Medical History:  Diagnosis Date  . AF (atrial fibrillation) [facility])    a. s/p RFCA/PVI @ [facility] clinic 2007.  . Arthritis    "joints" (06/21/2013)  . Atrial flutter ([place])    a. 06/2013 s/p TEE/DCCV  . Chronic systolic CHF (congestive heart failure) ([place])    a. 06/2013 Echo: EF 20%, diff HK, mild LVH.  Marland Kitchen History of cardiovascular stress test    Lexiscan Myoview 8/16:  EF 59%, attenuation artifact, no ischemia; Low Risk  . Hyperlipidemia   . HYPERTENSION, BENIGN 08/21/2009   Qualifier: Diagnosis of  By: Caryl Comes, MD, Remus Blake  Medications(s)  enalapril   . Hypothyroidism   . Neoplasm of uncertain behavior    SKIN  . Polymyalgia rheumatica ([place]) 01/13/2007   Qualifier: History of  By: Danny Lawless CMA, [place]      Past Surgical History:  Procedure Laterality Date  . ATRIAL FIBRILLATION ABLATION  2007  . CARDIOVERSION N/A 06/22/2013   Procedure: CARDIOVERSION;  Surgeon: Sueanne Margarita, MD;  Location: [facility] ENDOSCOPY;  Service: Cardiovascular;  Laterality: N/A;  15:17 Lido 20mg ,IV , Propofol 30 mg, IV synched  cardioversion @ 150 joules by Dr. Lajoyce Lauber from a flutter to sinis brady which is baseline for this pt, reports 45-50 reg HR verified by Dr. Ashok Norris  . CARDIOVERSION N/A 11/21/2014   Procedure: CARDIOVERSION;  Surgeon: Fay Records, MD;  Location: [facility] ENDOSCOPY;  Service: Cardiovascular;  Laterality: N/A;  . CARDIOVERSION N/A 07/14/2015   Procedure: CARDIOVERSION;  Surgeon: Sueanne Margarita, MD;  Location: [place];  Service: Cardiovascular;  Laterality: N/A;  . CATARACT EXTRACTION, BILATERAL Bilateral Summer 2009  . CHOLECYSTECTOMY    . EP IMPLANTABLE DEVICE N/A 11/10/2015   Procedure: Pacemaker Implant;  Surgeon: [name], MD;  Location: [place] CV LAB;  Service: Cardiovascular;  Laterality: N/A;  . HAND SURGERY Right    "broke bone; grafted area w/bone from somewhere else"  . HEMORRHOID SURGERY    . INGUINAL HERNIA REPAIR Bilateral   . TEE WITHOUT CARDIOVERSION N/A 06/22/2013   Procedure: TRANSESOPHAGEAL ECHOCARDIOGRAM (TEE);  Surgeon: Sueanne Margarita, MD;  Location: [facility] ENDOSCOPY;  Service: Cardiovascular;  Laterality: N/A;  . TEE WITHOUT CARDIOVERSION N/A 07/14/2015   Procedure: TRANSESOPHAGEAL ECHOCARDIOGRAM (TEE);  Surgeon: Sueanne Margarita, MD;  Location: [facility] ENDOSCOPY;  Service: Cardiovascular;  Laterality: N/A;  . TONSILLECTOMY      reports that he quit smoking about 52 years ago. His smoking use included Cigarettes. He has a 12.00 pack-year smoking history. He has never used smokeless tobacco. He reports that he drinks alcohol. He reports that he does not use drugs. family history includes  Diabetes in his mother; Emphysema in his mother; Kidney disease in his father. No Known Allergies   Review of Systems  Constitutional: Negative for fatigue.  Eyes: Negative for visual disturbance.  Respiratory: Negative for cough, chest tightness and shortness of breath.   Cardiovascular: Negative for chest pain, palpitations and leg swelling.  Neurological: Negative for dizziness, syncope,  weakness, light-headedness and headaches.       Objective:   Physical Exam  Constitutional: He is oriented to person, place, and time. He appears well-developed and well-nourished.  HENT:  Right Ear: External ear normal.  Left Ear: External ear normal.  Mouth/Throat: Oropharynx is clear and moist.  Eyes: Pupils are equal, round, and reactive to light.  Neck: Neck supple. No thyromegaly present.  Cardiovascular: Normal rate and regular rhythm.   Pulmonary/Chest: Effort normal and breath sounds normal. No respiratory distress. He has no wheezes. He has no rales.  Musculoskeletal: He exhibits no edema.  Neurological: He is alert and oriented to person, place, and time.       Assessment:     #1 hypertension. Repeat reading stable  #2 atrial fibrillation. Patient with pacemaker and stable. Remains on Coumadin.  #3 history of low testosterone. Past history of polycythemia on testosterone replacement    Plan:     -We explained rationale for taking him off testosterone replacement (polycythemia and associated risks). Do not feel he would be a good candidate for going back on that -Continue current blood pressure medications -Continue close follow-up with cardiology. -Reminder for flu vaccine this fall -Routine follow-up in 6 months and sooner as needed  [name] MD [facility]

## 2015-11-25 NOTE — Progress Notes (Signed)
Pre visit review using our clinic review tool, if applicable. No additional management support is needed unless otherwise documented below in the visit note. 

## 2015-12-03 ENCOUNTER — Ambulatory Visit (INDEPENDENT_AMBULATORY_CARE_PROVIDER_SITE_OTHER): Payer: Medicare Other | Admitting: *Deleted

## 2015-12-03 DIAGNOSIS — Z5181 Encounter for therapeutic drug level monitoring: Secondary | ICD-10-CM | POA: Diagnosis not present

## 2015-12-03 DIAGNOSIS — I4892 Unspecified atrial flutter: Secondary | ICD-10-CM

## 2015-12-03 DIAGNOSIS — Z950 Presence of cardiac pacemaker: Secondary | ICD-10-CM

## 2015-12-03 LAB — POCT INR: INR: 2

## 2015-12-03 NOTE — Progress Notes (Signed)
Wound recheck in clinic for patient reported "white pimple" over incision site.  Stitch removed from right lateral incision.  Antibiotic ointment and bandaid applied, patient instructed to keep in place for 24 hours.  Patient denies drainage, fever, chills, or other signs of infection.  Patient aware to call if he notices additional stitch, or if any of the above signs/symptoms of infection develop.  Patient verbalizes understanding of all instructions and denies additional questions or concerns at this time.

## 2015-12-24 ENCOUNTER — Ambulatory Visit (INDEPENDENT_AMBULATORY_CARE_PROVIDER_SITE_OTHER): Payer: Medicare Other | Admitting: *Deleted

## 2015-12-24 DIAGNOSIS — I4892 Unspecified atrial flutter: Secondary | ICD-10-CM | POA: Diagnosis not present

## 2015-12-24 DIAGNOSIS — Z5181 Encounter for therapeutic drug level monitoring: Secondary | ICD-10-CM | POA: Diagnosis not present

## 2015-12-24 LAB — POCT INR: INR: 2.1

## 2016-01-04 ENCOUNTER — Other Ambulatory Visit: Payer: Self-pay | Admitting: Family Medicine

## 2016-01-04 ENCOUNTER — Other Ambulatory Visit: Payer: Self-pay | Admitting: Internal Medicine

## 2016-01-07 NOTE — Telephone Encounter (Signed)
Rx refill sent to pharmacy. 

## 2016-01-09 DIAGNOSIS — N3281 Overactive bladder: Secondary | ICD-10-CM | POA: Diagnosis not present

## 2016-01-09 DIAGNOSIS — N5201 Erectile dysfunction due to arterial insufficiency: Secondary | ICD-10-CM | POA: Diagnosis not present

## 2016-01-09 DIAGNOSIS — N401 Enlarged prostate with lower urinary tract symptoms: Secondary | ICD-10-CM | POA: Diagnosis not present

## 2016-01-09 DIAGNOSIS — R351 Nocturia: Secondary | ICD-10-CM | POA: Diagnosis not present

## 2016-01-13 DIAGNOSIS — H04123 Dry eye syndrome of bilateral lacrimal glands: Secondary | ICD-10-CM | POA: Diagnosis not present

## 2016-01-13 DIAGNOSIS — H02102 Unspecified ectropion of right lower eyelid: Secondary | ICD-10-CM | POA: Diagnosis not present

## 2016-01-13 DIAGNOSIS — H5211 Myopia, right eye: Secondary | ICD-10-CM | POA: Diagnosis not present

## 2016-01-13 DIAGNOSIS — H02101 Unspecified ectropion of right upper eyelid: Secondary | ICD-10-CM | POA: Diagnosis not present

## 2016-01-13 DIAGNOSIS — H52221 Regular astigmatism, right eye: Secondary | ICD-10-CM | POA: Diagnosis not present

## 2016-01-13 DIAGNOSIS — H53032 Strabismic amblyopia, left eye: Secondary | ICD-10-CM | POA: Diagnosis not present

## 2016-01-13 DIAGNOSIS — H26492 Other secondary cataract, left eye: Secondary | ICD-10-CM | POA: Diagnosis not present

## 2016-01-13 DIAGNOSIS — H524 Presbyopia: Secondary | ICD-10-CM | POA: Diagnosis not present

## 2016-01-18 ENCOUNTER — Other Ambulatory Visit: Payer: Self-pay | Admitting: Family Medicine

## 2016-01-21 ENCOUNTER — Ambulatory Visit (INDEPENDENT_AMBULATORY_CARE_PROVIDER_SITE_OTHER): Payer: Medicare Other | Admitting: *Deleted

## 2016-01-21 DIAGNOSIS — Z5181 Encounter for therapeutic drug level monitoring: Secondary | ICD-10-CM | POA: Diagnosis not present

## 2016-01-21 DIAGNOSIS — I4892 Unspecified atrial flutter: Secondary | ICD-10-CM | POA: Diagnosis not present

## 2016-01-21 LAB — POCT INR: INR: 2.1

## 2016-01-23 ENCOUNTER — Encounter: Payer: Medicare Other | Admitting: Internal Medicine

## 2016-02-04 DIAGNOSIS — Z23 Encounter for immunization: Secondary | ICD-10-CM | POA: Diagnosis not present

## 2016-02-16 ENCOUNTER — Ambulatory Visit (INDEPENDENT_AMBULATORY_CARE_PROVIDER_SITE_OTHER): Payer: Medicare Other | Admitting: Internal Medicine

## 2016-02-16 ENCOUNTER — Ambulatory Visit (INDEPENDENT_AMBULATORY_CARE_PROVIDER_SITE_OTHER): Payer: Medicare Other | Admitting: Pharmacist

## 2016-02-16 ENCOUNTER — Encounter: Payer: Self-pay | Admitting: Internal Medicine

## 2016-02-16 VITALS — BP 150/88 | HR 78 | Ht 69.0 in | Wt 172.0 lb

## 2016-02-16 DIAGNOSIS — Z5181 Encounter for therapeutic drug level monitoring: Secondary | ICD-10-CM

## 2016-02-16 DIAGNOSIS — Z950 Presence of cardiac pacemaker: Secondary | ICD-10-CM

## 2016-02-16 DIAGNOSIS — I484 Atypical atrial flutter: Secondary | ICD-10-CM

## 2016-02-16 DIAGNOSIS — R001 Bradycardia, unspecified: Secondary | ICD-10-CM | POA: Diagnosis not present

## 2016-02-16 DIAGNOSIS — I4892 Unspecified atrial flutter: Secondary | ICD-10-CM | POA: Diagnosis not present

## 2016-02-16 LAB — CUP PACEART INCLINIC DEVICE CHECK
Battery Remaining Longevity: 124 mo
Brady Statistic AP VP Percent: 0.09 %
Brady Statistic AP VS Percent: 98.08 %
Brady Statistic AS VP Percent: 0 %
Brady Statistic AS VS Percent: 1.83 %
Brady Statistic RV Percent Paced: 0.09 %
Date Time Interrogation Session: 20171016160651
Implantable Lead Implant Date: 20170710
Implantable Lead Location: 753860
Lead Channel Impedance Value: 266 Ohm
Lead Channel Impedance Value: 456 Ohm
Lead Channel Impedance Value: 475 Ohm
Lead Channel Pacing Threshold Amplitude: 0.5 V
Lead Channel Sensing Intrinsic Amplitude: 3.5 mV
Lead Channel Sensing Intrinsic Amplitude: 5.875 mV
Lead Channel Sensing Intrinsic Amplitude: 6.75 mV
Lead Channel Setting Pacing Amplitude: 1.5 V
Lead Channel Setting Pacing Amplitude: 2.5 V
Lead Channel Setting Pacing Pulse Width: 0.4 ms
Lead Channel Setting Sensing Sensitivity: 0.9 mV
MDC IDC LEAD IMPLANT DT: 20170710
MDC IDC LEAD LOCATION: 753859
MDC IDC MSMT BATTERY VOLTAGE: 3.05 V
MDC IDC MSMT LEADCHNL RA IMPEDANCE VALUE: 361 Ohm
MDC IDC MSMT LEADCHNL RA PACING THRESHOLD AMPLITUDE: 0.5 V
MDC IDC MSMT LEADCHNL RA PACING THRESHOLD PULSEWIDTH: 0.4 ms
MDC IDC MSMT LEADCHNL RA SENSING INTR AMPL: 4.75 mV
MDC IDC MSMT LEADCHNL RV PACING THRESHOLD PULSEWIDTH: 0.4 ms
MDC IDC STAT BRADY RA PERCENT PACED: 98.17 %

## 2016-02-16 LAB — POCT INR: INR: 2

## 2016-02-16 MED ORDER — AMLODIPINE BESYLATE 10 MG PO TABS: 10.0000 mg | ORAL_TABLET | Freq: Every day | ORAL | 3 refills | Status: DC

## 2016-02-16 NOTE — Progress Notes (Signed)
Patient Care Team: [name], MD as PCP - General (Family Medicine) [name], MD (Ophthalmology) [name], MD (Orthopedic Surgery) [name], MD (Cardiology) [name], MD (General Surgery)   HPI  [name] is a 82 y.o. male Seen in followup of atypical atrial flutter and sinus node dysfunction resulting in pacemaker implantation 6/17.  He has not noticed much improvement in exercise tolerance following pacemaker implantation.  Prior PVI ([facility]-2007)>> Rx amiodarone+warfarin >>>TEEDCCV   More recently he has been on warfarin alone.  He had been seen in A. fib clinic on referral from the PA having been found in  atypical atrial flutter.  Cardioversion was arranged but in the interim he reverted to sinus rhythm.  Hosp 6.17   At the visit to the A. fib clinic, polycythemia was noted and described to testosterone; it was discontinued and hemoglobin has tended towards normal  Significant stressors related to his daughter and her psychiatric hospitalizations     DATE TEST    2/15 Echo ER 20%   5/15 Echo    EF 55-60 %   8/16    myoview   EF 59 %  NO ISCHEMIA  3/17 TEE EF 40-45%        Past Medical History:  Diagnosis Date  . AF (atrial fibrillation) [facility])    a. s/p RFCA/PVI @ [facility] clinic 2007.  . Arthritis    "joints" (06/21/2013)  . Atrial flutter ([place])    a. 06/2013 s/p TEE/DCCV  . Chronic systolic CHF (congestive heart failure) ([place])    a. 06/2013 Echo: EF 20%, diff HK, mild LVH.  Marland Kitchen History of cardiovascular stress test    Lexiscan Myoview 8/16:  EF 59%, attenuation artifact, no ischemia; Low Risk  . Hyperlipidemia   . HYPERTENSION, BENIGN 08/21/2009   Qualifier: Diagnosis of  By: Caryl Comes, MD, Remus Blake  Medications(s)  enalapril   . Hypothyroidism   . Neoplasm of uncertain behavior    SKIN  . Polymyalgia rheumatica ([place]) 01/13/2007   Qualifier: History of  By: Danny Lawless CMA, [place]       Past Surgical  History:  Procedure Laterality Date  . ATRIAL FIBRILLATION ABLATION  2007  . CARDIOVERSION N/A 06/22/2013   Procedure: CARDIOVERSION;  Surgeon: Sueanne Margarita, MD;  Location: [facility] ENDOSCOPY;  Service: Cardiovascular;  Laterality: N/A;  15:17 Lido 20mg ,IV , Propofol 30 mg, IV synched cardioversion @ 150 joules by Dr. Lajoyce Lauber from a flutter to sinis brady which is baseline for this pt, reports 45-50 reg HR verified by Dr. Ashok Norris  . CARDIOVERSION N/A 11/21/2014   Procedure: CARDIOVERSION;  Surgeon: Fay Records, MD;  Location: [facility] ENDOSCOPY;  Service: Cardiovascular;  Laterality: N/A;  . CARDIOVERSION N/A 07/14/2015   Procedure: CARDIOVERSION;  Surgeon: Sueanne Margarita, MD;  Location: [facility];  Service: Cardiovascular;  Laterality: N/A;  . CATARACT EXTRACTION, BILATERAL Bilateral Summer 2009  . CHOLECYSTECTOMY    . EP IMPLANTABLE DEVICE N/A 11/10/2015   Procedure: Pacemaker Implant;  Surgeon: [name], MD;  Location: [facility];  Service: Cardiovascular;  Laterality: N/A;  . HAND SURGERY Right    "broke bone; grafted area w/bone from somewhere else"  . HEMORRHOID SURGERY    . INGUINAL HERNIA REPAIR Bilateral   . TEE WITHOUT CARDIOVERSION N/A 06/22/2013   Procedure: TRANSESOPHAGEAL ECHOCARDIOGRAM (TEE);  Surgeon: Sueanne Margarita, MD;  Location: [facility] ENDOSCOPY;  Service: Cardiovascular;  Laterality: N/A;  . TEE  WITHOUT CARDIOVERSION N/A 07/14/2015   Procedure: TRANSESOPHAGEAL ECHOCARDIOGRAM (TEE);  Surgeon: Sueanne Margarita, MD;  Location: [facility] ENDOSCOPY;  Service: Cardiovascular;  Laterality: N/A;  . TONSILLECTOMY      Current Outpatient Prescriptions  Medication Sig Dispense Refill  . amLODipine (NORVASC) 5 MG tablet take 1 tablet by mouth once daily 30 tablet 6  . atorvastatin (LIPITOR) 10 MG tablet take 1 tablet by mouth daily 90 tablet 1  . carboxymethylcellulose (REFRESH TEARS) 0.5 % SOLN Place 1 drop into both eyes 3 (three) times daily as needed (dry eyes).     . cholecalciferol  (VITAMIN D) 1000 UNITS tablet Take 1,000 Units by mouth every morning.    . Glucosamine-Chondroitin 500-400 MG CAPS Take 1 capsule by mouth every morning.    Marland Kitchen levothyroxine (SYNTHROID, LEVOTHROID) 50 MCG tablet TAKE 1 TABLET BY MOUTH DAILY 90 tablet 1  . metoprolol succinate (TOPROL-XL) 50 MG 24 hr tablet Take 1.5 tablets by mouth daily.    . Multiple Vitamins-Minerals (CENTRUM SILVER PO) Take 1 tablet by mouth every morning.     . warfarin (COUMADIN) 2 MG tablet Take 1-4 mg by mouth See admin instructions. Take 4mg  ( two of the 2mg  tab) every day except on Mon, and Friday take half of the 1mg  per patient    . warfarin (COUMADIN) 2 MG tablet TAKE 1 AND 1/2 TO 2 TABLETS BY MOUTH DAILY AS DIRECTED BY COUMADIN CLINIC 180 tablet 1   No current facility-administered medications for this visit.     No Known Allergies  Review of Systems negative except from HPI and PMH  Physical Exam BP (!) 150/88   Pulse 78   Ht 5\' 9"  (1.753 m)   Wt 172 lb (78 kg)   SpO2 98%   BMI 25.40 kg/m  Well developed and well nourished in no acute distress HENT normal E scleral and icterus clear Neck Supple JVP flat; carotids brisk and full Clear to ausculation Device pocket well healed; without hematoma or erythema.  There is no tethering   *Regular rate and rhythm, no murmurs gallops or rub Soft with active bowel sounds; ecchymosis No clubbing cyanosis none Edema Alert and oriented, grossly normal motor and sensory function Skin Warm and Dry  ECG  [id number] Intervals 21/  09/40 Assessment and  Plan  Atrial Flutter/  Perhaps some intercurrent but nothing sustained  sinus bradycardia   NICM thought to be rate related  Resolved  Dyspnea on exertion  Pacemaker-[employer] (DOI 7/17)  Hypertension.  Blood pressure remains elevated; we will increase his amlodipine 5--10 will follow up with this with his primary care physician  No significant interval atrial arrhythmia  On Anticoagulation;  No  bleeding issues   Chronotropic competence much improved; however, I'm somewhat disappointed that it did not translate into improvement in exercise performance

## 2016-02-16 NOTE — Patient Instructions (Addendum)
Medication Instructions: - Your physician has recommended you make the following change in your medication:  1) Increase amlodipine to 10 mg once daily ( you may take 2 of the 5 mg tablets at the same time of day until you use them up)  Labwork: - none ordered  Procedures/Testing: - none ordered  Follow-Up: - Remote monitoring is used to monitor your Pacemaker of ICD from home. This monitoring reduces the number of office visits required to check your device to one time per year. It allows [place] to keep an eye on the functioning of your device to ensure it is working properly. You are scheduled for a device check from home on 05/17/16. You may send your transmission at any time that day. If you have a wireless device, the transmission will be sent automatically. After your physician reviews your transmission, you will receive a postcard with your next transmission date.  - Your physician wants you to follow-up in: 9 months with Dr. Caryl Comes. You will receive a reminder letter in the mail two months in advance. If you don't receive a letter, please call our office to schedule the follow-up appointment.  Any Additional Special Instructions Will Be Listed Below (If Applicable).     If you need a refill on your cardiac medications before your next appointment, please call your pharmacy.

## 2016-03-24 ENCOUNTER — Telehealth: Payer: Self-pay | Admitting: Family Medicine

## 2016-03-24 NOTE — Telephone Encounter (Signed)
Call [name] to schedule awv appt. Left msg for pt to call office to schedule appt.

## 2016-03-24 NOTE — Telephone Encounter (Signed)
Mr. Umpierre scheduled awv appt.

## 2016-03-30 ENCOUNTER — Ambulatory Visit (INDEPENDENT_AMBULATORY_CARE_PROVIDER_SITE_OTHER): Payer: Medicare Other | Admitting: *Deleted

## 2016-03-30 DIAGNOSIS — Z5181 Encounter for therapeutic drug level monitoring: Secondary | ICD-10-CM

## 2016-03-30 DIAGNOSIS — I4892 Unspecified atrial flutter: Secondary | ICD-10-CM | POA: Diagnosis not present

## 2016-03-30 LAB — POCT INR: INR: 2.5

## 2016-04-08 ENCOUNTER — Other Ambulatory Visit: Payer: Self-pay

## 2016-05-08 ENCOUNTER — Other Ambulatory Visit: Payer: Self-pay | Admitting: Internal Medicine

## 2016-05-11 ENCOUNTER — Ambulatory Visit (INDEPENDENT_AMBULATORY_CARE_PROVIDER_SITE_OTHER): Payer: Medicare Other | Admitting: *Deleted

## 2016-05-11 DIAGNOSIS — Z5181 Encounter for therapeutic drug level monitoring: Secondary | ICD-10-CM

## 2016-05-11 DIAGNOSIS — I4892 Unspecified atrial flutter: Secondary | ICD-10-CM | POA: Diagnosis not present

## 2016-05-11 LAB — POCT INR: INR: 2.2

## 2016-05-17 ENCOUNTER — Ambulatory Visit (INDEPENDENT_AMBULATORY_CARE_PROVIDER_SITE_OTHER): Payer: Medicare Other | Admitting: *Deleted

## 2016-05-17 DIAGNOSIS — I495 Sick sinus syndrome: Secondary | ICD-10-CM | POA: Diagnosis not present

## 2016-05-17 NOTE — Progress Notes (Signed)
Remote pacemaker transmission.   

## 2016-05-21 LAB — CUP PACEART REMOTE DEVICE CHECK
Battery Remaining Longevity: 113 mo
Battery Voltage: 3.03 V
Brady Statistic AP VP Percent: 0.23 %
Brady Statistic AS VP Percent: 0 %
Brady Statistic AS VS Percent: 1.09 %
Implantable Lead Implant Date: 20170710
Implantable Lead Location: 753860
Implantable Lead Model: 5076
Implantable Pulse Generator Implant Date: 20170710
Lead Channel Impedance Value: 266 Ohm
Lead Channel Impedance Value: 361 Ohm
Lead Channel Impedance Value: 475 Ohm
Lead Channel Pacing Threshold Amplitude: 0.5 V
Lead Channel Pacing Threshold Amplitude: 0.75 V
Lead Channel Pacing Threshold Pulse Width: 0.4 ms
Lead Channel Sensing Intrinsic Amplitude: 5.625 mV
Lead Channel Setting Pacing Amplitude: 2.5 V
MDC IDC LEAD IMPLANT DT: 20170710
MDC IDC LEAD LOCATION: 753859
MDC IDC MSMT LEADCHNL RA PACING THRESHOLD PULSEWIDTH: 0.4 ms
MDC IDC MSMT LEADCHNL RA SENSING INTR AMPL: 5.625 mV
MDC IDC MSMT LEADCHNL RV IMPEDANCE VALUE: 437 Ohm
MDC IDC MSMT LEADCHNL RV SENSING INTR AMPL: 5.375 mV
MDC IDC MSMT LEADCHNL RV SENSING INTR AMPL: 5.375 mV
MDC IDC SESS DTM: 20180115163435
MDC IDC SET LEADCHNL RA PACING AMPLITUDE: 1.5 V
MDC IDC SET LEADCHNL RV PACING PULSEWIDTH: 0.4 ms
MDC IDC SET LEADCHNL RV SENSING SENSITIVITY: 0.9 mV
MDC IDC STAT BRADY AP VS PERCENT: 98.69 %
MDC IDC STAT BRADY RA PERCENT PACED: 98.1 %
MDC IDC STAT BRADY RV PERCENT PACED: 0.29 %

## 2016-05-28 ENCOUNTER — Encounter: Payer: Self-pay | Admitting: Family Medicine

## 2016-05-28 ENCOUNTER — Ambulatory Visit (INDEPENDENT_AMBULATORY_CARE_PROVIDER_SITE_OTHER): Payer: Medicare Other | Admitting: Family Medicine

## 2016-05-28 VITALS — BP 126/70 | HR 68 | Temp 97.3°F | Ht 69.0 in | Wt 175.4 lb

## 2016-05-28 DIAGNOSIS — Z23 Encounter for immunization: Secondary | ICD-10-CM

## 2016-05-28 DIAGNOSIS — N183 Chronic kidney disease, stage 3 unspecified: Secondary | ICD-10-CM

## 2016-05-28 DIAGNOSIS — I10 Essential (primary) hypertension: Secondary | ICD-10-CM

## 2016-05-28 DIAGNOSIS — R600 Localized edema: Secondary | ICD-10-CM

## 2016-05-28 DIAGNOSIS — E039 Hypothyroidism, unspecified: Secondary | ICD-10-CM

## 2016-05-28 DIAGNOSIS — Z0000 Encounter for general adult medical examination without abnormal findings: Secondary | ICD-10-CM

## 2016-05-28 DIAGNOSIS — E291 Testicular hypofunction: Secondary | ICD-10-CM

## 2016-05-28 LAB — BASIC METABOLIC PANEL
BUN: 27 mg/dL — ABNORMAL HIGH (ref 6–23)
CALCIUM: 9.5 mg/dL (ref 8.4–10.5)
CHLORIDE: 103 meq/L (ref 96–112)
CO2: 29 meq/L (ref 19–32)
Creatinine, Ser: 1.17 mg/dL (ref 0.40–1.50)
GFR: 62.48 mL/min (ref >60.00)
Glucose, Bld: 94 mg/dL (ref 70–99)
Potassium: 4.8 mEq/L (ref 3.5–5.1)
SODIUM: 140 meq/L (ref 135–145)

## 2016-05-28 LAB — TSH: TSH: 3.28 u[IU]/mL (ref 0.35–4.50)

## 2016-05-28 MED ORDER — FUROSEMIDE 20 MG PO TABS: 20.0000 mg | ORAL_TABLET | Freq: Every day | ORAL | 3 refills | Status: DC

## 2016-05-28 NOTE — Patient Instructions (Addendum)
[name] , Thank you for taking time to come for your Medicare Wellness Visit. I appreciate your ongoing commitment to your health goals. Please review the following plan we discussed and let me know if I can assist you in the future.   Last pneumonia vaccine given today PSV 23   Pharmaceutical support for [employer] ; If you want to continue [employer]? at: [email or web address] or [phone] Monday - Friday 9 AM to 8 PM ET  These are the goals we discussed: to continue to maintain your health  Goals    None      This is a list of the screening recommended for you and due dates:  Health Maintenance  Topic Date Due  . Pneumonia vaccines (2 of 2 - PPSV23) 01/25/2014  . Flu Shot  12/02/2015  . Tetanus Vaccine  01/17/2020  . Shingles Vaccine  Addressed      Fall Prevention in the Home Introduction Falls can cause injuries. They can happen to people of all ages. There are many things you can do to make your home safe and to help prevent falls. What can I do on the outside of my home?  Regularly fix the edges of walkways and driveways and fix any cracks.  Remove anything that might make you trip as you walk through a door, such as a raised step or threshold.  Trim any bushes or trees on the path to your home.  Use bright outdoor lighting.  Clear any walking paths of anything that might make someone trip, such as rocks or tools.  Regularly check to see if handrails are loose or broken. Make sure that both sides of any steps have handrails.  Any raised decks and porches should have guardrails on the edges.  Have any leaves, snow, or ice cleared regularly.  Use sand or salt on walking paths during winter.  Clean up any spills in your garage right away. This includes oil or grease spills. What can I do in the bathroom?  Use night lights.  Install grab bars by the toilet and in the tub and shower. Do  not use towel bars as grab bars.  Use non-skid mats or decals in the tub or shower.  If you need to sit down in the shower, use a plastic, non-slip stool.  Keep the floor dry. Clean up any water that spills on the floor as soon as it happens.  Remove soap buildup in the tub or shower regularly.  Attach bath mats securely with double-sided non-slip rug tape.  Do not have throw rugs and other things on the floor that can make you trip. What can I do in the bedroom?  Use night lights.  Make sure that you have a light by your bed that is easy to reach.  Do not use any sheets or blankets that are too big for your bed. They should not hang down onto the floor.  Have a firm chair that has side arms. You can use this for support while you get dressed.  Do not have throw rugs and other things on the floor that can make you trip. What can I do in the kitchen?  Clean up any spills right away.  Avoid walking on wet floors.  Keep items that you use a lot in easy-to-reach places.  If you need to reach something above you, use a strong step stool that has a grab bar.  Keep electrical cords out of  the way.  Do not use floor polish or wax that makes floors slippery. If you must use wax, use non-skid floor wax.  Do not have throw rugs and other things on the floor that can make you trip. What can I do with my stairs?  Do not leave any items on the stairs.  Make sure that there are handrails on both sides of the stairs and use them. Fix handrails that are broken or loose. Make sure that handrails are as long as the stairways.  Check any carpeting to make sure that it is firmly attached to the stairs. Fix any carpet that is loose or worn.  Avoid having throw rugs at the top or bottom of the stairs. If you do have throw rugs, attach them to the floor with carpet tape.  Make sure that you have a light switch at the top of the stairs and the bottom of the stairs. If you do not have them,  ask someone to add them for you. What else can I do to help prevent falls?  Wear shoes that:  Do not have high heels.  Have rubber bottoms.  Are comfortable and fit you well.  Are closed at the toe. Do not wear sandals.  If you use a stepladder:  Make sure that it is fully opened. Do not climb a closed stepladder.  Make sure that both sides of the stepladder are locked into place.  Ask someone to hold it for you, if possible.  Clearly mark and make sure that you can see:  Any grab bars or handrails.  First and last steps.  Where the edge of each step is.  Use tools that help you move around (mobility aids) if they are needed. These include:  Canes.  Walkers.  Scooters.  Crutches.  Turn on the lights when you go into a dark area. Replace any light bulbs as soon as they burn out.  Set up your furniture so you have a clear path. Avoid moving your furniture around.  If any of your floors are uneven, fix them.  If there are any pets around you, be aware of where they are.  Review your medicines with your doctor. Some medicines can make you feel dizzy. This can increase your chance of falling. Ask your doctor what other things that you can do to help prevent falls. This information is not intended to replace advice given to you by your health care provider. Make sure you discuss any questions you have with your health care provider. Document Released: 02/13/2009 Document Revised: 09/25/2015 Document Reviewed: 05/24/2014  2017 Elsevier  Health Maintenance, Male A healthy lifestyle and preventative care can promote health and wellness.  Maintain regular health, dental, and eye exams.  Eat a healthy diet. Foods like vegetables, fruits, whole grains, low-fat dairy products, and lean protein foods contain the nutrients you need and are low in calories. Decrease your intake of foods high in solid fats, added sugars, and salt. Get information about a proper diet from  your health care provider, if necessary.  Regular physical exercise is one of the most important things you can do for your health. Most adults should get at least 150 minutes of moderate-intensity exercise (any activity that increases your heart rate and causes you to sweat) each week. In addition, most adults need muscle-strengthening exercises on 2 or more days a week.   Maintain a healthy weight. The body mass index (BMI) is a screening tool to identify possible  weight problems. It provides an estimate of body fat based on height and weight. Your health care provider can find your BMI and can help you achieve or maintain a healthy weight. For males 20 years and older:  A BMI below 18.5 is considered underweight.  A BMI of 18.5 to 24.9 is normal.  A BMI of 25 to 29.9 is considered overweight.  A BMI of 30 and above is considered obese.  Maintain normal blood lipids and cholesterol by exercising and minimizing your intake of saturated fat. Eat a balanced diet with plenty of fruits and vegetables. Blood tests for lipids and cholesterol should begin at age 75 and be repeated every 5 years. If your lipid or cholesterol levels are high, you are over age 42, or you are at high risk for heart disease, you may need your cholesterol levels checked more frequently.Ongoing high lipid and cholesterol levels should be treated with medicines if diet and exercise are not working.  If you smoke, find out from your health care provider how to quit. If you do not use tobacco, do not start.  Lung cancer screening is recommended for adults aged 77-80 years who are at high risk for developing lung cancer because of a history of smoking. A yearly low-dose CT scan of the lungs is recommended for people who have at least a 30-pack-year history of smoking and are current smokers or have quit within the past 15 years. A pack year of smoking is smoking an average of 1 pack of cigarettes a day for 1 year (for example, a  30-pack-year history of smoking could mean smoking 1 pack a day for 30 years or 2 packs a day for 15 years). Yearly screening should continue until the smoker has stopped smoking for at least 15 years. Yearly screening should be stopped for people who develop a health problem that would prevent them from having lung cancer treatment.  If you choose to drink alcohol, do not have more than 2 drinks per day. One drink is considered to be 12 oz (360 mL) of beer, 5 oz (150 mL) of wine, or 1.5 oz (45 mL) of liquor.  Avoid the use of street drugs. Do not share needles with anyone. Ask for help if you need support or instructions about stopping the use of drugs.  High blood pressure causes heart disease and increases the risk of stroke. High blood pressure is more likely to develop in:  People who have blood pressure in the end of the normal range (100-139/85-89 mm Hg).  People who are overweight or obese.  People who are African American.  If you are 64-32 years of age, have your blood pressure checked every 3-5 years. If you are 95 years of age or older, have your blood pressure checked every year. You should have your blood pressure measured twice-once when you are at a hospital or clinic, and once when you are not at a hospital or clinic. Record the average of the two measurements. To check your blood pressure when you are not at a hospital or clinic, you can use:  An automated blood pressure machine at a pharmacy.  A home blood pressure monitor.  If you are 77-3 years old, ask your health care provider if you should take aspirin to prevent heart disease.  Diabetes screening involves taking a blood sample to check your fasting blood sugar level. This should be done once every 3 years after age 36 if you are at a normal  weight and without risk factors for diabetes. Testing should be considered at a younger age or be carried out more frequently if you are overweight and have at least 1 risk factor  for diabetes.  Colorectal cancer can be detected and often prevented. Most routine colorectal cancer screening begins at the age of 76 and continues through age 41. However, your health care provider may recommend screening at an earlier age if you have risk factors for colon cancer. On a yearly basis, your health care provider may provide home test kits to check for hidden blood in the stool. A small camera at the end of a tube may be used to directly examine the colon (sigmoidoscopy or colonoscopy) to detect the earliest forms of colorectal cancer. Talk to your health care provider about this at age 2 when routine screening begins. A direct exam of the colon should be repeated every 5-10 years through age 67, unless early forms of precancerous polyps or small growths are found.  People who are at an increased risk for hepatitis B should be screened for this virus. You are considered at high risk for hepatitis B if:  You were born in a country where hepatitis B occurs often. Talk with your health care provider about which countries are considered high risk.  Your parents were born in a high-risk country and you have not received a shot to protect against hepatitis B (hepatitis B vaccine).  You have HIV or AIDS.  You use needles to inject street drugs.  You live with, or have sex with, someone who has hepatitis B.  You are a man who has sex with other men (MSM).  You get hemodialysis treatment.  You take certain medicines for conditions like cancer, organ transplantation, and autoimmune conditions.  Hepatitis C blood testing is recommended for all people born from [birth year] through 1963 and any individual with known risk factors for hepatitis C.  Healthy men should no longer receive prostate-specific antigen (PSA) blood tests as part of routine cancer screening. Talk to your health care provider about prostate cancer screening.  Testicular cancer screening is not recommended for adolescents or  adult males who have no symptoms. Screening includes self-exam, a health care provider exam, and other screening tests. Consult with your health care provider about any symptoms you have or any concerns you have about testicular cancer.  Practice safe sex. Use condoms and avoid high-risk sexual practices to reduce the spread of sexually transmitted infections (STIs).  You should be screened for STIs, including gonorrhea and chlamydia if:  You are sexually active and are younger than 24 years.  You are older than 24 years, and your health care provider tells you that you are at risk for this type of infection.  Your sexual activity has changed since you were last screened, and you are at an increased risk for chlamydia or gonorrhea. Ask your health care provider if you are at risk.  If you are at risk of being infected with HIV, it is recommended that you take a prescription medicine daily to prevent HIV infection. This is called pre-exposure prophylaxis (PrEP). You are considered at risk if:  You are a man who has sex with other men (MSM).  You are a heterosexual man who is sexually active with multiple partners.  You take drugs by injection.  You are sexually active with a partner who has HIV.  Talk with your health care provider about whether you are at high risk of being infected  with HIV. If you choose to begin PrEP, you should first be tested for HIV. You should then be tested every 3 months for as long as you are taking PrEP.  Use sunscreen. Apply sunscreen liberally and repeatedly throughout the day. You should seek shade when your shadow is shorter than you. Protect yourself by wearing long sleeves, pants, a wide-brimmed hat, and sunglasses year round whenever you are outdoors.  Tell your health care provider of new moles or changes in moles, especially if there is a change in shape or color. Also, tell your health care provider if a mole is larger than the size of a pencil  eraser.  A one-time screening for abdominal aortic aneurysm (AAA) and surgical repair of large AAAs by ultrasound is recommended for men aged 55-75 years who are current or former smokers.  Stay current with your vaccines (immunizations). This information is not intended to replace advice given to you by your health care provider. Make sure you discuss any questions you have with your health care provider. Document Released: 10/16/2007 Document Revised: 05/10/2014 Document Reviewed: 01/21/2015 Elsevier Interactive Patient Education  2017 [employer] ahead and start the furosemide 20 mg once daily Elevate legs frequently Let's plan on follow up in about 4 days.

## 2016-05-28 NOTE — Progress Notes (Signed)
Subjective:   [name] is a 83 y.o. male who presents for Medicare Annual/Subsequent preventive examination.  The Patient was informed that the wellness visit is to identify future health risk and educate and initiate measures that can reduce risk for increased disease through the lifespan.    NO ROS; Medicare Wellness Visit Had a sinus node dysfunction ; reporting around 2007 had ablation helped atrial fib  Pacemaker in July  2017 ; set rate at 36  Was concerned about high pulse; feeling tired; but up every 2 hours to the bathroom and also c/o of dry eyes  And is up at hs q 2 h going to the bathroom   Describes health as good, fair or great?  States he is having some medical issues;  Fatigue; mild swelling of ankles; back pain with pain down left leg but is improving;     Patient-completed health risk assessment  - completed and reviewed, see scanned documentation  The following written screening schedule of preventive measures were reviewed with assessment and plan made per below and patient instructions:  Smoking history reviewed / 12 pack years  Screening AAA ; deferred to cardiology   Use of Smokeless tobacco  no Second Hand Smoke status; No Smokers in the home   RISK FACTORS Regular exercise-  Used to do 31 pushups and 31 situp's and can't do them since he had the implant; trying to get back to where he was but he can't do them Educated it takes 3 weeks to lose muscle and it takes time to gain it back  Was back up to 15 of each of the above Now back when out   Diet;  Breakfast this am honey dew melon; V8 juice; fruit and oatmeal Likes spicy food;  Lunch; soup and likes to make his own Will make a sandwich;  [place] pastrami  Supper; grills meats incorporates fruits and vegetables;  Lipids reviewed and reducing cholesterol discussed  Referral to Diabetes and Nutritional center if appropriate   Fall risk: presented mobile; no  Mobility of Functional  changes this year? Can't do as much   Safety at home and  Community reviewed; Lives at home with wife 2 level Main bedroom is downstairs; little slower up the stairs Would probably go to [facility] Wife is in good health   wears sunscreen if in the sun; not out in the sun very much Quit doing yard work this year due to cardiac issues Keeps firearms in a safe place if they exist  Safe driving recommendations for older adults Motor vehicle accidents assessed in the last year   Depression Screen PhQ 2: negative  Activities of Daily Living - See functional screen   Cognitive testing; Ad8 score; 0 or less than 2  MMSE deferred or completed if AD8 + 2 issues  Advanced Directives reviewed for completion; discussion with MD as well as supportive resources as needed Will bring a copy of HCPOA   Patient Care Team: [name], MD as PCP - General (Family Medicine) [name], MD (Ophthalmology) [name], MD (Orthopedic Surgery) [name], MD (Cardiology) [name], MD (General Surgery)   Preventives screens reviewed Colonoscopy - 2005; aged   PSA followed by urologist    Immunization History  Administered Date(s) Administered  . Influenza Split 01/25/2012  . Influenza Whole 02/08/2008, 03/12/2009, 01/16/2010  . Influenza, High Dose Seasonal PF 01/24/2015  . Influenza,inj,Quad PF,36+ Mos 01/25/2013, 01/23/2014  . Influenza-Unspecified 01/02/2016  .  Pneumococcal Conjugate-13 01/25/2013  . Td 01/16/2010   Required Immunizations needed today  Screening test up to date or reviewed for plan of completion Health Maintenance Due  Topic Date Due  . PNA vac Low Risk Adult (2 of 2 - PPSV23) 01/25/2014  . INFLUENZA VACCINE  12/02/2015    Had flu vaccine at [employer] Needs PSV 23; and will take that today   ' Cardiac Risk Factors include: advanced age (>64men, >2 women);dyslipidemia;male gender;hypertension;family history of premature  cardiovascular disease  Given PSV 23 today     Objective:    Vitals: BP 126/70   Pulse 68   Temp 97.3 F (36.3 C)   Ht 5\' 9"  (1.753 m)   Wt 175 lb 7 oz (79.6 kg)   SpO2 95%   BMI 25.91 kg/m   Body mass index is 25.91 kg/m.  Tobacco History  Smoking Status  . Former Smoker  . Packs/day: 1.00  . Years: 12.00  . Types: Cigarettes  . Quit date: 05/04/1963  Smokeless Tobacco  . Never Used    Comment: quit cigars 82'      Counseling given: Yes   Past Medical History:  Diagnosis Date  . AF (atrial fibrillation) [facility])    a. s/p RFCA/PVI @ [facility] clinic 2007.  . Arthritis    "joints" (06/21/2013)  . Atrial flutter ([place])    a. 06/2013 s/p TEE/DCCV  . Chronic systolic CHF (congestive heart failure) ([place])    a. 06/2013 Echo: EF 20%, diff HK, mild LVH.  Marland Kitchen History of cardiovascular stress test    Lexiscan Myoview 8/16:  EF 59%, attenuation artifact, no ischemia; Low Risk  . Hyperlipidemia   . HYPERTENSION, BENIGN 08/21/2009   Qualifier: Diagnosis of  By: Caryl Comes, MD, Remus Blake  Medications(s)  enalapril   . Hypothyroidism   . Neoplasm of uncertain behavior    SKIN  . Polymyalgia rheumatica ([place]) 01/13/2007   Qualifier: History of  By: Danny Lawless CMA, [place]      Past Surgical History:  Procedure Laterality Date  . ATRIAL FIBRILLATION ABLATION  2007  . CARDIOVERSION N/A 06/22/2013   Procedure: CARDIOVERSION;  Surgeon: Sueanne Margarita, MD;  [facility] END[facility];  Service: Cardiovascular;  Laterality: N/A;  15:17 Lido 20mg ,IV , Propofol 30 mg, IV synched cardioversion @ 150 joules by Dr. Lajoyce Lauber from a flutter to sinis brady which is baseline for this pt, reports 45-50 reg HR verified by Dr. Ashok Norris  . CARDIOVERSION N/A 11/21/2014   Procedure: CARDIOVERSION;  Surgeon: Fay Records, MD;  Location: [facility] ENDOSCOPY;  Service: Cardiovascular;  Laterality: N/A;  . CARDIOVERSION N/A 07/14/2015   Procedure: CARDIOVERSION;  Surgeon: Sueanne Margarita, MD;  Location: [place];   Service: Cardiovascular;  Laterality: N/A;  . CATARACT EXTRACTION, BILATERAL Bilateral Summer 2009  . CHOLECYSTECTOMY    . EP IMPLANTABLE DEVICE N/A 11/10/2015   Procedure: Pacemaker Implant;  Surgeon: [name], MD;  Location: [place] CV LAB;  Service: Cardiovascular;  Laterality: N/A;  . HAND SURGERY Right    "broke bone; grafted area w/bone from somewhere else"  . HEMORRHOID SURGERY    . INGUINAL HERNIA REPAIR Bilateral   . TEE WITHOUT CARDIOVERSION N/A 06/22/2013   Procedure: TRANSESOPHAGEAL ECHOCARDIOGRAM (TEE);  Surgeon: Sueanne Margarita, MD;  Location: [facility] ENDOSCOPY;  Service: Cardiovascular;  Laterality: N/A;  . TEE WITHOUT CARDIOVERSION N/A 07/14/2015   Procedure: TRANSESOPHAGEAL ECHOCARDIOGRAM (TEE);  Surgeon: Sueanne Margarita, MD;  Location: [place];  Service: Cardiovascular;  Laterality:  N/A;  . TONSILLECTOMY     Family History  Problem Relation Age of Onset  . Emphysema Mother   . Diabetes Mother   . Kidney disease Father    History  Sexual Activity  . Sexual activity: No    Outpatient Encounter Prescriptions as of 05/28/2016  Medication Sig  . atorvastatin (LIPITOR) 10 MG tablet take 1 tablet by mouth daily  . carboxymethylcellulose (REFRESH TEARS) 0.5 % SOLN Place 1 drop into both eyes 3 (three) times daily as needed (dry eyes).   . cholecalciferol (VITAMIN D) 1000 UNITS tablet Take 1,000 Units by mouth every morning.  . Glucosamine-Chondroitin 500-400 MG CAPS Take 1 capsule by mouth every morning.  Marland Kitchen levothyroxine (SYNTHROID, LEVOTHROID) 50 MCG tablet TAKE 1 TABLET BY MOUTH DAILY  . metoprolol succinate (TOPROL-XL) 50 MG 24 hr tablet TAKE 1 AND 1/2 TABLETS BY MOUTH DAILY  . Multiple Vitamins-Minerals (CENTRUM SILVER PO) Take 1 tablet by mouth every morning.   . warfarin (COUMADIN) 2 MG tablet Take 1-4 mg by mouth See admin instructions. Take 4mg  ( two of the 2mg  tab) every day except on Mon, and Friday take half of the 1mg  per patient  . warfarin (COUMADIN) 2  MG tablet TAKE 1 AND 1/2 TO 2 TABLETS BY MOUTH DAILY AS DIRECTED BY COUMADIN CLINIC  . amLODipine (NORVASC) 10 MG tablet Take 1 tablet (10 mg total) by mouth daily.   No facility-administered encounter medications on file as of 05/28/2016.     Activities of Daily Living In your present state of health, do you have any difficulty performing the following activities: 05/28/2016 11/10/2015  Hearing? [place]? Y -  Difficulty concentrating or making decisions? N -  Walking or climbing stairs? N -  Dressing or bathing? N -  Doing errands, shopping? N N  Preparing Food and eating ? N -  Using the Toilet? N -  In the past six months, have you accidently leaked urine? Y -  Do you have problems with loss of bowel control? N -  Managing your Medications? N -  Managing your Finances? N -  Housekeeping or managing your Housekeeping? N -  Some recent data might be hidden    Patient Care Team: [name], MD as PCP - General (Family Medicine) [name], MD (Ophthalmology) [name], MD (Orthopedic Surgery) [name], MD (Cardiology) [name], MD (General Surgery)   Assessment:     Exercise Activities and Dietary recommendations Current Exercise Habits: Home exercise routine, Type of exercise: strength training/weights, Time (Minutes): 30, Frequency (Times/Week): 6, Weekly Exercise (Minutes/Week): 180, Intensity: Moderate  Goals    None     Fall Risk Fall Risk  05/28/2016 04/08/2016 01/24/2015 01/23/2014  Falls in the past year? No No No No   Depression Screen PHQ 2/9 Scores 05/28/2016 01/24/2015 01/23/2014  PHQ - 2 Score 0 0 0  Exception Documentation (No Data) - -    Cognitive Function MMSE - Mini Mental State Exam 05/28/2016  Not completed: (No Data)        Immunization History  Administered Date(s) Administered  . Influenza Split 01/25/2012  . Influenza Whole 02/08/2008, 03/12/2009, 01/16/2010  . Influenza, High Dose Seasonal PF 01/24/2015  .  Influenza,inj,Quad PF,36+ Mos 01/25/2013, 01/23/2014  . Influenza-Unspecified 01/02/2016  . Pneumococcal Conjugate-13 01/25/2013  . Td 01/16/2010   Screening Tests Health Maintenance  Topic Date Due  . PNA vac Low Risk Adult (2 of 2 - PPSV23) 01/25/2014  .  INFLUENZA VACCINE  12/02/2015  . TETANUS/TDAP  01/17/2020  . ZOSTAVAX  Addressed      Plan:     Given PSV 23 Given resources for Myrbetriq if he decides to stay on it  During the course of the visit the patient was educated and counseled about the following appropriate screening and preventive services:   Vaccines to include Pneumoccal, Influenza, Hepatitis B, Td, Zostavax, HCV  Electrocardiogram - cardiology   Cardiovascular Disease  Colorectal cancer screening Dr. Deatra Ina told him no more   Diabetes screening - neg  Prostate Cancer Screening - goes to [facility]  Glaucoma screening neg  Nutrition counseling  Good  Smoking cessation counseling no longer smokes  Discussed AAA; defer for now  Patient Instructions (the written plan) was given to the patient.    Wynetta Fines, RN  05/28/2016

## 2016-05-28 NOTE — Progress Notes (Signed)
Subjective:     Patient ID: [name], male   DOB: 1925/09/10, 83 y.o.   MRN: [id number]  HPI Patient is here for Medicare wellness visit today (with our health coach) and here for medical follow-up as well. His chronic problems include history of atrial fibrillation, hypertension, systolic heart failure, hypothyroidism, chronic kidney disease, past history of polymyalgia rheumatica, hyperlipidemia, urinary urgency. He has history of low testosterone and was taken off testosterone last March because of polycythemia. He's had some reduced endurance and stamina since then. He has frequent urine urgency especially at night which has been a chronic issue and is followed by urology. He has chronic dry eyes which has been a complaint for over 20 years. He frequently feels cold and he thinks is maybe related to Coumadin. He had normal TSH last June. Is on Synthroid and takes this regularly.  Chronic kidney disease stage III. Recent increased peripheral edema. His weight is up about 10 pounds from last year. No orthopnea. No dyspnea at rest. No chest pains. He does take amlodipine for hypertension. Watches sodium intake closely.  Past Medical History:  Diagnosis Date  . AF (atrial fibrillation) [facility])    a. s/p RFCA/PVI @ [facility] clinic 2007.  . Arthritis    "joints" (06/21/2013)  . Atrial flutter ([place])    a. 06/2013 s/p TEE/DCCV  . Chronic systolic CHF (congestive heart failure) ([place])    a. 06/2013 Echo: EF 20%, diff HK, mild LVH.  Marland Kitchen History of cardiovascular stress test    Lexiscan Myoview 8/16:  EF 59%, attenuation artifact, no ischemia; Low Risk  . Hyperlipidemia   . HYPERTENSION, BENIGN 08/21/2009   Qualifier: Diagnosis of  By: Caryl Comes, MD, Remus Blake  Medications(s)  enalapril   . Hypothyroidism   . Neoplasm of uncertain behavior    SKIN  . Polymyalgia rheumatica ([place]) 01/13/2007   Qualifier: History of  By: Danny Lawless CMA, [place]      Past Surgical History:  Procedure Laterality  Date  . ATRIAL FIBRILLATION ABLATION  2007  . CARDIOVERSION N/A 06/22/2013   Procedure: CARDIOVERSION;  Surgeon: Sueanne Margarita, MD;  [facility] ENDOSCOPY;  Service: Cardiovascular;  Laterality: N/A;  15:17 Lido 20mg ,IV , Propofol 30 mg, IV synched cardioversion @ 150 joules by Dr. Lajoyce Lauber from a flutter to sinis brady which is baseline for this pt, reports 45-50 reg HR verified by Dr. Ashok Norris  . CARDIOVERSION N/A 11/21/2014   Procedure: CARDIOVERSION;  Surgeon: Fay Records, MD;  Location: [facility] ENDOSCOPY;  Service: Cardiovascular;  Laterality: N/A;  . CARDIOVERSION N/A 07/14/2015   Procedure: CARDIOVERSION;  Surgeon: Sueanne Margarita, MD;  Location: [place];  Service: Cardiovascular;  Laterality: N/A;  . CATARACT EXTRACTION, BILATERAL Bilateral Summer 2009  . CHOLECYSTECTOMY    . EP IMPLANTABLE DEVICE N/A 11/10/2015   Procedure: Pacemaker Implant;  Surgeon: [name], MD;  Location: [place] CV LAB;  Service: Cardiovascular;  Laterality: N/A;  . HAND SURGERY Right    "broke bone; grafted area w/bone from somewhere else"  . HEMORRHOID SURGERY    . INGUINAL HERNIA REPAIR Bilateral   . TEE WITHOUT CARDIOVERSION N/A 06/22/2013   Procedure: TRANSESOPHAGEAL ECHOCARDIOGRAM (TEE);  Surgeon: Sueanne Margarita, MD;  Location: [facility] ENDOSCOPY;  Service: Cardiovascular;  Laterality: N/A;  . TEE WITHOUT CARDIOVERSION N/A 07/14/2015   Procedure: TRANSESOPHAGEAL ECHOCARDIOGRAM (TEE);  Surgeon: Sueanne Margarita, MD;  Location: [facility] ENDOSCOPY;  Service: Cardiovascular;  Laterality: N/A;  . TONSILLECTOMY  reports that he quit smoking about 53 years ago. His smoking use included Cigarettes. He has a 12.00 pack-year smoking history. He has never used smokeless tobacco. He reports that he drinks alcohol. He reports that he does not use drugs. family history includes Diabetes in his mother; Emphysema in his mother; Kidney disease in his father. No Known Allergies   Review of Systems  Constitutional: Positive  for unexpected weight change. Negative for appetite change and fatigue.  Eyes: Negative for visual disturbance.  Respiratory: Negative for cough, chest tightness and shortness of breath.   Cardiovascular: Positive for leg swelling. Negative for chest pain and palpitations.  Endocrine: Negative for polydipsia and polyuria.  Neurological: Negative for dizziness, syncope, weakness, light-headedness and headaches.       Objective:   Physical Exam  Constitutional: He is oriented to person, place, and time. He appears well-developed and well-nourished.  HENT:  Right Ear: External ear normal.  Left Ear: External ear normal.  Mouth/Throat: Oropharynx is clear and moist.  Eyes: Pupils are equal, round, and reactive to light.  Neck: Neck supple. No thyromegaly present.  Cardiovascular: Normal rate.   Pulmonary/Chest: Effort normal and breath sounds normal. No respiratory distress. He has no wheezes. He has no rales.  Musculoskeletal: He exhibits edema.  One Plus pitting edema feet ankles and lower legs bilaterally  Lymphadenopathy:    He has no cervical adenopathy.  Neurological: He is alert and oriented to person, place, and time.       Assessment:     #1 history of hypertension stable  #2 hypothyroidism  #3 history of low testosterone with secondary polycythemia on testosterone which had to be discontinued last year  #4 reduced stamina endurance probably related to #3  #5 Bilateral leg edema-probably multifactorial. He is on amlodipine. He has history of heart failure but no overt symptoms today      Plan:     -Recheck TSH and basic metabolic panel today -Elevate legs frequently -Furosemide 20 mg once daily for 4 days and follow-up here in 4 days to reassess -Follow-up sooner for any increased dyspnea or other concerns -He is encouraged follow-up with urology regarding his urinary urgency symptoms  [name] MD [facility] at [facility]

## 2016-06-01 ENCOUNTER — Encounter: Payer: Self-pay | Admitting: Family Medicine

## 2016-06-01 ENCOUNTER — Ambulatory Visit (INDEPENDENT_AMBULATORY_CARE_PROVIDER_SITE_OTHER): Payer: Medicare Other | Admitting: Family Medicine

## 2016-06-01 VITALS — BP 122/70 | HR 78 | Ht 69.0 in | Wt 174.0 lb

## 2016-06-01 DIAGNOSIS — R600 Localized edema: Secondary | ICD-10-CM

## 2016-06-01 DIAGNOSIS — I10 Essential (primary) hypertension: Secondary | ICD-10-CM | POA: Diagnosis not present

## 2016-06-01 NOTE — Progress Notes (Signed)
Pre visit review using our clinic review tool, if applicable. No additional management support is needed unless otherwise documented below in the visit note. 

## 2016-06-01 NOTE — Patient Instructions (Signed)
Reduce Amlodipine to 5 mg (one half of the 10 mg tab) Continue with the Lasix 20 mg once daily Elevate legs frequently.

## 2016-06-01 NOTE — Progress Notes (Signed)
Subjective:     Patient ID: [name], male   DOB: 06-Oct-1925, 83 y.o.   MRN: [id number]  HPI Follow-up bilateral leg edema. Refer to previous note. His creatinine was stable 1.17 and normal TSH. Started low-dose furosemide 20 mg once daily. Weight is down 1 pound a day. He has seen mild improvement in leg edema. No dyspnea. No orthopnea. Not elevating legs frequently. Edema is worse late in the day. He is on amlodipine 10 mg daily  Past Medical History:  Diagnosis Date  . AF (atrial fibrillation) [facility])    a. s/p RFCA/PVI @ [facility] clinic 2007.  . Arthritis    "joints" (06/21/2013)  . Atrial flutter ([place])    a. 06/2013 s/p TEE/DCCV  . Chronic systolic CHF (congestive heart failure) ([place])    a. 06/2013 Echo: EF 20%, diff HK, mild LVH.  Marland Kitchen History of cardiovascular stress test    Lexiscan Myoview 8/16:  EF 59%, attenuation artifact, no ischemia; Low Risk  . Hyperlipidemia   . HYPERTENSION, BENIGN 08/21/2009   Qualifier: Diagnosis of  By: Caryl Comes, MD, Remus Blake  Medications(s)  enalapril   . Hypothyroidism   . Neoplasm of uncertain behavior    SKIN  . Polymyalgia rheumatica ([place]) 01/13/2007   Qualifier: History of  By: Danny Lawless CMA, [place]      Past Surgical History:  Procedure Laterality Date  . ATRIAL FIBRILLATION ABLATION  2007  . CARDIOVERSION N/A 06/22/2013   Procedure: CARDIOVERSION;  Surgeon: Sueanne Margarita, MD;  [facility] ENDOSCOPY;  Service: Cardiovascular;  Laterality: N/A;  15:17 Lido 20mg ,IV , Propofol 30 mg, IV synched cardioversion @ 150 joules by Dr. Lajoyce Lauber from a flutter to sinis brady which is baseline for this pt, reports 45-50 reg HR verified by Dr. Ashok Norris  . CARDIOVERSION N/A 11/21/2014   Procedure: CARDIOVERSION;  Surgeon: Fay Records, MD;  Location: [facility] ENDOSCOPY;  Service: Cardiovascular;  Laterality: N/A;  . CARDIOVERSION N/A 07/14/2015   Procedure: CARDIOVERSION;  Surgeon: Sueanne Margarita, MD;  Location: [place];  Service: Cardiovascular;   Laterality: N/A;  . CATARACT EXTRACTION, BILATERAL Bilateral Summer 2009  . CHOLECYSTECTOMY    . EP IMPLANTABLE DEVICE N/A 11/10/2015   Procedure: Pacemaker Implant;  Surgeon: [name], MD;  Location: [place] CV LAB;  Service: Cardiovascular;  Laterality: N/A;  . HAND SURGERY Right    "broke bone; grafted area w/bone from somewhere else"  . HEMORRHOID SURGERY    . INGUINAL HERNIA REPAIR Bilateral   . TEE WITHOUT CARDIOVERSION N/A 06/22/2013   Procedure: TRANSESOPHAGEAL ECHOCARDIOGRAM (TEE);  Surgeon: Sueanne Margarita, MD;  [facility] ENDOSCOPY;  Service: Cardiovascular;  Laterality: N/A;  . TEE WITHOUT CARDIOVERSION N/A 07/14/2015   Procedure: TRANSESOPHAGEAL ECHOCARDIOGRAM (TEE);  Surgeon: Sueanne Margarita, MD;  Location: [facility] ENDOSCOPY;  Service: Cardiovascular;  Laterality: N/A;  . TONSILLECTOMY      reports that he quit smoking about 53 years ago. His smoking use included Cigarettes. He has a 12.00 pack-year smoking history. He has never used smokeless tobacco. He reports that he drinks alcohol. He reports that he does not use drugs. family history includes Diabetes in his mother; Emphysema in his mother; Kidney disease in his father. No Known Allergies   Review of Systems  Constitutional: Negative for fatigue and unexpected weight change.  Eyes: Negative for visual disturbance.  Respiratory: Negative for cough, chest tightness and shortness of breath.   Cardiovascular: Positive for leg swelling. Negative for chest pain and palpitations.  Neurological: Negative for dizziness, syncope, weakness, light-headedness and headaches.       Objective:   Physical Exam  Constitutional: He is oriented to person, place, and time. He appears well-developed and well-nourished.  HENT:  Right Ear: External ear normal.  Left Ear: External ear normal.  Mouth/Throat: Oropharynx is clear and moist.  Eyes: Pupils are equal, round, and reactive to light.  Neck: Neck supple. No thyromegaly  present.  Cardiovascular: Normal rate and regular rhythm.   Pulmonary/Chest: Effort normal and breath sounds normal. No respiratory distress. He has no wheezes. He has no rales.  Musculoskeletal: He exhibits edema.  Trace edema legs bilaterally  Neurological: He is alert and oriented to person, place, and time.       Assessment:     #1 hypertension stable both here by several home readings with consistent systolics [id number]  #2 bilateral leg edema. Probably related to high-dose amlodipine and venous stasis predominantly    Plan:     -Reduce amlodipine to 5 mg daily. -Continue low-dose furosemide 20 mg daily -Elevate legs more frequently -Reassess in 2 weeks and if edema improved at that point with reduction amlodipine will discontinue furosemide.  [name] MD [facility] at [facility]

## 2016-06-02 ENCOUNTER — Telehealth: Payer: Self-pay | Admitting: *Deleted

## 2016-06-02 NOTE — Telephone Encounter (Signed)
CALLED TO INFORM HIM THAT HE IS SUPPOSE TO BE TAKING AMLODIPINE 10 MG DAILY, HE STATED THAT HIS PCP REDUCED AMLODIPINE TO 5 MG SO I INFORMED HIM TO CONTACT DR Elease Hashimoto ABOUT THIS. PT AWARE AND EXPRESSED UNDERSTANDING.

## 2016-06-03 ENCOUNTER — Other Ambulatory Visit: Payer: Self-pay

## 2016-06-03 MED ORDER — ATORVASTATIN CALCIUM 10 MG PO TABS: ORAL_TABLET | ORAL | 1 refills | Status: DC

## 2016-06-16 ENCOUNTER — Encounter: Payer: Self-pay | Admitting: Family Medicine

## 2016-06-16 ENCOUNTER — Ambulatory Visit (INDEPENDENT_AMBULATORY_CARE_PROVIDER_SITE_OTHER): Payer: Medicare Other | Admitting: Family Medicine

## 2016-06-16 VITALS — BP 110/70 | HR 82 | Ht 69.0 in | Wt 175.0 lb

## 2016-06-16 DIAGNOSIS — R600 Localized edema: Secondary | ICD-10-CM | POA: Diagnosis not present

## 2016-06-16 DIAGNOSIS — I10 Essential (primary) hypertension: Secondary | ICD-10-CM | POA: Diagnosis not present

## 2016-06-16 NOTE — Progress Notes (Signed)
Pre visit review using our clinic review tool, if applicable. No additional management support is needed unless otherwise documented below in the visit note. 

## 2016-06-16 NOTE — Patient Instructions (Signed)
Try reducing the Furosemide 20 mg to one every Monday, Wednesday, and Friday Continue with daily weights and be in touch for weight gain > 2 pounds in one day or 5 days in a week. Continue potassium rich diet.   Potassium Content of Foods Potassium is a mineral found in many foods and drinks. It helps keep fluids and minerals balanced in your body and affects how steadily your heart beats. Potassium also helps control your blood pressure and keep your muscles and nervous system healthy. Certain health conditions and medicines may change the balance of potassium in your body. When this happens, you can help balance your level of potassium through the foods that you do or do not eat. Your health care provider or dietitian may recommend an amount of potassium that you should have each day. The following lists of foods provide the amount of potassium (in parentheses) per serving in each item. High in potassium The following foods and beverages have 200 mg or more of potassium per serving:  Apricots, 2 raw or 5 dry (200 mg).  Artichoke, 1 medium (345 mg).  Avocado, raw,  each (245 mg).  Banana, 1 medium (425 mg).  Beans, lima, or baked beans, canned,  cup (280 mg).  Beans, white, canned,  cup (595 mg).  Beef roast, 3 oz (320 mg).  Beef, ground, 3 oz (270 mg).  Beets, raw or cooked,  cup (260 mg).  Bran muffin, 2 oz (300 mg).  Broccoli,  cup (230 mg).  Brussels sprouts,  cup (250 mg).  Cantaloupe,  cup (215 mg).  Cereal, 100% bran,  cup (200-400 mg).  Cheeseburger, single, fast food, 1 each (225-400 mg).  Chicken, 3 oz (220 mg).  Clams, canned, 3 oz (535 mg).  Crab, 3 oz (225 mg).  Dates, 5 each (270 mg).  Dried beans and peas,  cup (300-475 mg).  Figs, dried, 2 each (260 mg).  Fish: halibut, tuna, cod, snapper, 3 oz (480 mg).  Fish: salmon, haddock, swordfish, perch, 3 oz (300 mg).  Fish, tuna, canned 3 oz (200 mg).  [place] fries, fast food, 3 oz (470  mg).  Granola with fruit and nuts,  cup (200 mg).  Grapefruit juice,  cup (200 mg).  Greens, beet,  cup (655 mg).  Honeydew melon,  cup (200 mg).  Kale, raw, 1 cup (300 mg).  Kiwi, 1 medium (240 mg).  Kohlrabi, rutabaga, parsnips,  cup (280 mg).  Lentils,  cup (365 mg).  Mango, 1 each (325 mg).  Milk, chocolate, 1 cup (420 mg).  Milk: nonfat, low-fat, whole, buttermilk, 1 cup (350-380 mg).  Molasses, 1 Tbsp (295 mg).  Mushrooms,  cup (280) mg.  Nectarine, 1 each (275 mg).  Nuts: almonds, peanuts, hazelnuts, [place], cashew, mixed, 1 oz (200 mg).  Nuts, pistachios, 1 oz (295 mg).  Orange, 1 each (240 mg).  Orange juice,  cup (235 mg).  Papaya, medium,  fruit (390 mg).  Peanut butter, chunky, 2 Tbsp (240 mg).  Peanut butter, smooth, 2 Tbsp (210 mg).  Pear, 1 medium (200 mg).  Pomegranate, 1 whole (400 mg).  Pomegranate juice,  cup (215 mg).  Pork, 3 oz (350 mg).  Potato chips, salted, 1 oz (465 mg).  Potato, baked with skin, 1 medium (925 mg).  Potatoes, boiled,  cup (255 mg).  Potatoes, mashed,  cup (330 mg).  Prune juice,  cup (370 mg).  Prunes, 5 each (305 mg).  Pudding, chocolate,  cup (230 mg).  Pumpkin, canned,  cup (250 mg).  Raisins, seedless,  cup (270 mg).  Seeds, sunflower or pumpkin, 1 oz (240 mg).  Soy milk, 1 cup (300 mg).  Spinach,  cup (420 mg).  Spinach, canned,  cup (370 mg).  Sweet potato, baked with skin, 1 medium (450 mg).  Swiss chard,  cup (480 mg).  Tomato or vegetable juice,  cup (275 mg).  Tomato sauce or puree,  cup (400-550 mg).  Tomato, raw, 1 medium (290 mg).  Tomatoes, canned,  cup (200-300 mg).  [place], 3 oz (250 mg).  Wheat germ, 1 oz (250 mg).  Winter squash,  cup (250 mg).  Yogurt, plain or fruited, 6 oz (260-435 mg).  Zucchini,  cup (220 mg). Moderate in potassium The following foods and beverages have 50-200 mg of potassium per serving:  Apple, 1 each (150  mg).  Apple juice,  cup (150 mg).  Applesauce,  cup (90 mg).  Apricot nectar,  cup (140 mg).  Asparagus, small spears,  cup or 6 spears (155 mg).  Bagel, cinnamon raisin, 1 each (130 mg).  Bagel, egg or plain, 4 in., 1 each (70 mg).  Beans, green,  cup (90 mg).  Beans, yellow,  cup (190 mg).  Beer, regular, 12 oz (100 mg).  Beets, canned,  cup (125 mg).  Blackberries,  cup (115 mg).  Blueberries,  cup (60 mg).  Bread, whole wheat, 1 slice (70 mg).  Broccoli, raw,  cup (145 mg).  Cabbage,  cup (150 mg).  Carrots, cooked or raw,  cup (180 mg).  Cauliflower, raw,  cup (150 mg).  Celery, raw,  cup (155 mg).  Cereal, bran flakes, cup (120-150 mg).  Cheese, cottage,  cup (110 mg).  Cherries, 10 each (150 mg).  Chocolate, 1 oz bar (165 mg).  Coffee, brewed 6 oz (90 mg).  Corn,  cup or 1 ear (195 mg).  Cucumbers,  cup (80 mg).  Egg, large, 1 each (60 mg).  Eggplant,  cup (60 mg).  Endive, raw, cup (80 mg).  English muffin, 1 each (65 mg).  Fish, orange roughy, 3 oz (150 mg).  Frankfurter, beef or pork, 1 each (75 mg).  Fruit cocktail,  cup (115 mg).  Grape juice,  cup (170 mg).  Grapefruit,  fruit (175 mg).  Grapes,  cup (155 mg).  Greens: kale, turnip, collard,  cup (110-150 mg).  Ice cream or frozen yogurt, chocolate,  cup (175 mg).  Ice cream or frozen yogurt, vanilla,  cup (120-150 mg).  Lemons, limes, 1 each (80 mg).  Lettuce, all types, 1 cup (100 mg).  Mixed vegetables,  cup (150 mg).  Mushrooms, raw,  cup (110 mg).  Nuts: walnuts, pecans, or macadamia, 1 oz (125 mg).  Oatmeal,  cup (80 mg).  Okra,  cup (110 mg).  Onions, raw,  cup (120 mg).  Peach, 1 each (185 mg).  Peaches, canned,  cup (120 mg).  Pears, canned,  cup (120 mg).  Peas, green, frozen,  cup (90 mg).  Peppers, green,  cup (130 mg).  Peppers, red,  cup (160 mg).  Pineapple juice,  cup (165 mg).  Pineapple,  fresh or canned,  cup (100 mg).  Plums, 1 each (105 mg).  Pudding, vanilla,  cup (150 mg).  Raspberries,  cup (90 mg).  Rhubarb,  cup (115 mg).  Rice, wild,  cup (80 mg).  Shrimp, 3 oz (155 mg).  Spinach, raw, 1 cup (170 mg).  Strawberries,  cup (125 mg).  Summer squash  cup (175-200 mg).  Swiss chard, raw, 1 cup (135 mg).  Tangerines, 1 each (140 mg).  Tea, brewed, 6 oz (65 mg).  Turnips,  cup (140 mg).  Watermelon,  cup (85 mg).  Wine, red, table, 5 oz (180 mg).  Wine, white, table, 5 oz (100 mg). Low in potassium The following foods and beverages have less than 50 mg of potassium per serving.  Bread, white, 1 slice (30 mg).  Carbonated beverages, 12 oz (less than 5 mg).  Cheese, 1 oz (20-30 mg).  Cranberries,  cup (45 mg).  Cranberry juice cocktail,  cup (20 mg).  Fats and oils, 1 Tbsp (less than 5 mg).  Hummus, 1 Tbsp (32 mg).  Nectar: papaya, mango, or pear,  cup (35 mg).  Rice, white or brown,  cup (50 mg).  Spaghetti or macaroni,  cup cooked (30 mg).  Tortilla, flour or corn, 1 each (50 mg).  Waffle, 4 in., 1 each (50 mg).  Water chestnuts,  cup (40 mg). This information is not intended to replace advice given to you by your health care provider. Make sure you discuss any questions you have with your health care provider. Document Released: 12/01/2004 Document Revised: 09/25/2015 Document Reviewed: 03/16/2013 Elsevier Interactive Patient Education  2017 [employer].

## 2016-06-16 NOTE — Progress Notes (Signed)
Subjective:     Patient ID: [name], male   DOB: [birth year], 83 y.o.   MRN: [id number]  HPI Follow-up bilateral leg edema. We reduced his amlodipine from 10 to 5 mg. He is on furosemide 20 mg daily. Weight is essentially unchanged. Edema no worse. Not much improved either. No orthopnea. He is trying to consume some potassium rich foods. Denies any leg cramps. No chest pains.  Past Medical History:  Diagnosis Date  . AF (atrial fibrillation) [facility])    a. s/p RFCA/PVI @ [facility] clinic 2007.  . Arthritis    "joints" (06/21/2013)  . Atrial flutter ([place])    a. 06/2013 s/p TEE/DCCV  . Chronic systolic CHF (congestive heart failure) ([place])    a. 06/2013 Echo: EF 20%, diff HK, mild LVH.  Marland Kitchen History of cardiovascular stress test    Lexiscan Myoview 8/16:  EF 59%, attenuation artifact, no ischemia; Low Risk  . Hyperlipidemia   . HYPERTENSION, BENIGN 08/21/2009   Qualifier: Diagnosis of  By: Caryl Comes, MD, Remus Blake  Medications(s)  enalapril   . Hypothyroidism   . Neoplasm of uncertain behavior    SKIN  . Polymyalgia rheumatica ([place]) 01/13/2007   Qualifier: History of  By: Danny Lawless CMA, [place]      Past Surgical History:  Procedure Laterality Date  . ATRIAL FIBRILLATION ABLATION  2007  . CARDIOVERSION N/A 06/22/2013   Procedure: CARDIOVERSION;  Surgeon: Sueanne Margarita, MD;  Location: [facility] ENDOSCOPY;  Service: Cardiovascular;  Laterality: N/A;  15:17 Lido 20mg ,IV , Propofol 30 mg, IV synched cardioversion @ 150 joules by Dr. Lajoyce Lauber from a flutter to sinis brady which is baseline for this pt, reports 45-50 reg HR verified by Dr. Ashok Norris  . CARDIOVERSION N/A 11/21/2014   Procedure: CARDIOVERSION;  Surgeon: Fay Records, MD;  Location: [facility] ENDOSCOPY;  Service: Cardiovascular;  Laterality: N/A;  . CARDIOVERSION N/A 07/14/2015   Procedure: CARDIOVERSION;  Surgeon: Sueanne Margarita, MD;  Location: [place];  Service: Cardiovascular;  Laterality: N/A;  . CATARACT EXTRACTION, BILATERAL  Bilateral Summer 2009  . CHOLECYSTECTOMY    . EP IMPLANTABLE DEVICE N/A 11/10/2015   Procedure: Pacemaker Implant;  Surgeon: [name], MD;  Location: [place];  Service: Cardiovascular;  Laterality: N/A;  . HAND SURGERY Right    "broke bone; grafted area w/bone from somewhere else"  . HEMORRHOID SURGERY    . INGUINAL HERNIA REPAIR Bilateral   . TEE WITHOUT CARDIOVERSION N/A 06/22/2013   Procedure: TRANSESOPHAGEAL ECHOCARDIOGRAM (TEE);  Surgeon: Sueanne Margarita, MD;  Location: [facility] ENDOSCOPY;  Service: Cardiovascular;  Laterality: N/A;  . TEE WITHOUT CARDIOVERSION N/A 07/14/2015   Procedure: TRANSESOPHAGEAL ECHOCARDIOGRAM (TEE);  Surgeon: Sueanne Margarita, MD;  Location: [facility] ENDOSCOPY;  Service: Cardiovascular;  Laterality: N/A;  . TONSILLECTOMY      reports that he quit smoking about 53 years ago. His smoking use included Cigarettes. He has a 12.00 pack-year smoking history. He has never used smokeless tobacco. He reports that he drinks alcohol. He reports that he does not use drugs. family history includes Diabetes in his mother; Emphysema in his mother; Kidney disease in his father. No Known Allergies   Review of Systems  Constitutional: Positive for fatigue.  Eyes: Negative for visual disturbance.  Respiratory: Negative for cough, chest tightness and shortness of breath.   Cardiovascular: Positive for leg swelling. Negative for chest pain and palpitations.  Neurological: Negative for dizziness, syncope, weakness, light-headedness and headaches.  Objective:   Physical Exam  Constitutional: He is oriented to person, place, and time. He appears well-developed and well-nourished.  HENT:  Right Ear: External ear normal.  Left Ear: External ear normal.  Mouth/Throat: Oropharynx is clear and moist.  Eyes: Pupils are equal, round, and reactive to light.  Neck: Neck supple. No thyromegaly present.  Cardiovascular: Normal rate and regular rhythm.   Pulmonary/Chest: Effort  normal and breath sounds normal. No respiratory distress. He has no wheezes. He has no rales.  Musculoskeletal: He exhibits no edema.  trace nonpitting edema lower legs bilaterally  Neurological: He is alert and oriented to person, place, and time.       Assessment:     #1 bilateral leg edema. I think this is slightly improved after reduction in amlodipine.  #2 hypertension stable    Plan:     -Reduce furosemide to 20 g every Monday, Wednesday, and  Friday -Continue daily weights and be in touch if weight is increasing -Watch sodium intake -Reassess in 3 months  [name] MD [facility]

## 2016-06-29 ENCOUNTER — Ambulatory Visit (INDEPENDENT_AMBULATORY_CARE_PROVIDER_SITE_OTHER): Payer: Medicare Other | Admitting: *Deleted

## 2016-06-29 DIAGNOSIS — I4892 Unspecified atrial flutter: Secondary | ICD-10-CM | POA: Diagnosis not present

## 2016-06-29 DIAGNOSIS — Z5181 Encounter for therapeutic drug level monitoring: Secondary | ICD-10-CM | POA: Diagnosis not present

## 2016-06-29 LAB — POCT INR: INR: 2.1

## 2016-07-11 ENCOUNTER — Other Ambulatory Visit: Payer: Self-pay | Admitting: Family Medicine

## 2016-07-15 ENCOUNTER — Telehealth: Payer: Self-pay | Admitting: Family Medicine

## 2016-07-15 DIAGNOSIS — H9190 Unspecified hearing loss, unspecified ear: Secondary | ICD-10-CM

## 2016-07-15 NOTE — Telephone Encounter (Signed)
The patient needs a referral for a hearing evaluation the paper is on Rachel's desk.

## 2016-07-16 NOTE — Telephone Encounter (Signed)
Okay to order/refer?

## 2016-07-16 NOTE — Telephone Encounter (Signed)
OK 

## 2016-07-16 NOTE — Telephone Encounter (Signed)
Order placed

## 2016-07-16 NOTE — Telephone Encounter (Signed)
Referral ordered and faxed

## 2016-07-24 ENCOUNTER — Other Ambulatory Visit: Payer: Self-pay | Admitting: Internal Medicine

## 2016-07-28 DIAGNOSIS — H903 Sensorineural hearing loss, bilateral: Secondary | ICD-10-CM | POA: Diagnosis not present

## 2016-08-03 DIAGNOSIS — H900 Conductive hearing loss, bilateral: Secondary | ICD-10-CM | POA: Diagnosis not present

## 2016-08-03 DIAGNOSIS — H6123 Impacted cerumen, bilateral: Secondary | ICD-10-CM | POA: Diagnosis not present

## 2016-08-10 ENCOUNTER — Ambulatory Visit (INDEPENDENT_AMBULATORY_CARE_PROVIDER_SITE_OTHER): Payer: Medicare Other

## 2016-08-10 DIAGNOSIS — Z5181 Encounter for therapeutic drug level monitoring: Secondary | ICD-10-CM

## 2016-08-10 DIAGNOSIS — I4892 Unspecified atrial flutter: Secondary | ICD-10-CM | POA: Diagnosis not present

## 2016-08-10 LAB — POCT INR: INR: 2

## 2016-08-16 ENCOUNTER — Ambulatory Visit (INDEPENDENT_AMBULATORY_CARE_PROVIDER_SITE_OTHER): Payer: Medicare Other | Admitting: *Deleted

## 2016-08-16 DIAGNOSIS — I495 Sick sinus syndrome: Secondary | ICD-10-CM

## 2016-08-16 NOTE — Progress Notes (Signed)
Remote pacemaker transmission.   

## 2016-08-18 ENCOUNTER — Encounter: Payer: Self-pay | Admitting: Cardiology

## 2016-08-19 LAB — CUP PACEART REMOTE DEVICE CHECK
Brady Statistic AP VP Percent: 0.05 %
Brady Statistic AP VS Percent: 96.92 %
Brady Statistic AS VP Percent: 0 %
Brady Statistic RA Percent Paced: 96.86 %
Date Time Interrogation Session: 20180416141810
Implantable Lead Model: 3830
Implantable Lead Model: 5076
Lead Channel Impedance Value: 266 Ohm
Lead Channel Impedance Value: 361 Ohm
Lead Channel Pacing Threshold Amplitude: 0.625 V
Lead Channel Pacing Threshold Pulse Width: 0.4 ms
Lead Channel Sensing Intrinsic Amplitude: 3.125 mV
Lead Channel Sensing Intrinsic Amplitude: 4.875 mV
Lead Channel Setting Pacing Amplitude: 1.5 V
MDC IDC LEAD IMPLANT DT: 20170710
MDC IDC LEAD IMPLANT DT: 20170710
MDC IDC LEAD LOCATION: 753859
MDC IDC LEAD LOCATION: 753860
MDC IDC MSMT BATTERY REMAINING LONGEVITY: 108 mo
MDC IDC MSMT BATTERY VOLTAGE: 3.02 V
MDC IDC MSMT LEADCHNL RA IMPEDANCE VALUE: 437 Ohm
MDC IDC MSMT LEADCHNL RA PACING THRESHOLD PULSEWIDTH: 0.4 ms
MDC IDC MSMT LEADCHNL RA SENSING INTR AMPL: 3.125 mV
MDC IDC MSMT LEADCHNL RV IMPEDANCE VALUE: 456 Ohm
MDC IDC MSMT LEADCHNL RV PACING THRESHOLD AMPLITUDE: 0.5 V
MDC IDC MSMT LEADCHNL RV SENSING INTR AMPL: 4.875 mV
MDC IDC PG IMPLANT DT: 20170710
MDC IDC SET LEADCHNL RV PACING AMPLITUDE: 2.5 V
MDC IDC SET LEADCHNL RV PACING PULSEWIDTH: 0.4 ms
MDC IDC SET LEADCHNL RV SENSING SENSITIVITY: 0.9 mV
MDC IDC STAT BRADY AS VS PERCENT: 3.02 %
MDC IDC STAT BRADY RV PERCENT PACED: 0.07 %

## 2016-08-25 ENCOUNTER — Encounter: Payer: Self-pay | Admitting: Internal Medicine

## 2016-08-28 ENCOUNTER — Other Ambulatory Visit (HOSPITAL_COMMUNITY): Payer: Self-pay | Admitting: Nurse Practitioner

## 2016-09-01 ENCOUNTER — Encounter (HOSPITAL_COMMUNITY): Payer: Self-pay | Admitting: *Deleted

## 2016-09-01 ENCOUNTER — Emergency Department (HOSPITAL_COMMUNITY)
Admission: EM | Admit: 2016-09-01 | Discharge: 2016-09-01 | Disposition: A | Discharge status: Home / Self Care | Payer: Medicare Other | Attending: Emergency Medicine | Admitting: Emergency Medicine

## 2016-09-01 ENCOUNTER — Emergency Department (HOSPITAL_COMMUNITY): Payer: Medicare Other

## 2016-09-01 DIAGNOSIS — S62111A Displaced fracture of triquetrum [cuneiform] bone, right wrist, initial encounter for closed fracture: Secondary | ICD-10-CM | POA: Insufficient documentation

## 2016-09-01 DIAGNOSIS — Y929 Unspecified place or not applicable: Secondary | ICD-10-CM | POA: Insufficient documentation

## 2016-09-01 DIAGNOSIS — Y999 Unspecified external cause status: Secondary | ICD-10-CM | POA: Diagnosis not present

## 2016-09-01 DIAGNOSIS — I130 Hypertensive heart and chronic kidney disease with heart failure and stage 1 through stage 4 chronic kidney disease, or unspecified chronic kidney disease: Secondary | ICD-10-CM | POA: Insufficient documentation

## 2016-09-01 DIAGNOSIS — W010XXA Fall on same level from slipping, tripping and stumbling without subsequent striking against object, initial encounter: Secondary | ICD-10-CM | POA: Insufficient documentation

## 2016-09-01 DIAGNOSIS — S0990XA Unspecified injury of head, initial encounter: Secondary | ICD-10-CM | POA: Diagnosis not present

## 2016-09-01 DIAGNOSIS — I5022 Chronic systolic (congestive) heart failure: Secondary | ICD-10-CM | POA: Diagnosis not present

## 2016-09-01 DIAGNOSIS — N183 Chronic kidney disease, stage 3 (moderate): Secondary | ICD-10-CM | POA: Diagnosis not present

## 2016-09-01 DIAGNOSIS — S0081XA Abrasion of other part of head, initial encounter: Secondary | ICD-10-CM | POA: Diagnosis not present

## 2016-09-01 DIAGNOSIS — Z87891 Personal history of nicotine dependence: Secondary | ICD-10-CM | POA: Diagnosis not present

## 2016-09-01 DIAGNOSIS — Y93K1 Activity, walking an animal: Secondary | ICD-10-CM | POA: Insufficient documentation

## 2016-09-01 DIAGNOSIS — Z7901 Long term (current) use of anticoagulants: Secondary | ICD-10-CM | POA: Insufficient documentation

## 2016-09-01 DIAGNOSIS — E039 Hypothyroidism, unspecified: Secondary | ICD-10-CM | POA: Diagnosis not present

## 2016-09-01 DIAGNOSIS — W19XXXA Unspecified fall, initial encounter: Secondary | ICD-10-CM

## 2016-09-01 DIAGNOSIS — S6991XA Unspecified injury of right wrist, hand and finger(s), initial encounter: Secondary | ICD-10-CM | POA: Diagnosis present

## 2016-09-01 LAB — PROTIME-INR
INR: 1.97
PROTHROMBIN TIME: 22.7 s — AB (ref 11.4–15.2)

## 2016-09-01 NOTE — ED Triage Notes (Signed)
Pt was taking dogs out last nite on a brick porch and one got under him and tripped him, he landed face first, no LOC.  Pt is on coumadin, no active bleeding, no mental status change, pupils 3RRB bilaterally.  Pt complains of right wrist pain and hand pain- Swelling and right middle finger swollen and bruise (hard to move).

## 2016-09-01 NOTE — Progress Notes (Signed)
Orthopedic Tech Progress Note Patient Details:  [name] 1925/12/07 [id number]  Ortho Devices Type of Ortho Device: Ace wrap, Volar splint Ortho Device/Splint Interventions: Application   [place] Pink 09/01/2016, 1:09 PM

## 2016-09-01 NOTE — ED Provider Notes (Signed)
[facility] DEPT Provider Note   CSN: [id number] Arrival date & time: 09/01/16  1123     History   Chief Complaint Chief Complaint  Patient presents with  . Head Injury  . Wrist Injury    HPI [name] is a 83 y.o. male.  HPI  83 year old male with a history of A. fib currently on Coumadin presents after a fall last night. He is taking his dogs out for a walk and when he went to pick one of them up another one caused him to trip. He fell and landed on a brick patio. He has small abrasions to his for head and nose. He also has right hand and wrist pain. The pain is improving since last night. He took Tylenol last night. Swelling is also a little bit improved. There is no weakness or numbness. He denies any headache or loss of consciousness. No other injuries.  Past Medical History:  Diagnosis Date  . AF (atrial fibrillation) [facility])    a. s/p RFCA/PVI @ [facility] clinic 2007.  . Arthritis    "joints" (06/21/2013)  . Atrial flutter ([place])    a. 06/2013 s/p TEE/DCCV  . Chronic systolic CHF (congestive heart failure) ([place])    a. 06/2013 Echo: EF 20%, diff HK, mild LVH.  Marland Kitchen History of cardiovascular stress test    Lexiscan Myoview 8/16:  EF 59%, attenuation artifact, no ischemia; Low Risk  . Hyperlipidemia   . HYPERTENSION, BENIGN 08/21/2009   Qualifier: Diagnosis of  By: Caryl Comes, MD, Remus Blake  Medications(s)  enalapril   . Hypothyroidism   . Neoplasm of uncertain behavior    SKIN  . Polymyalgia rheumatica ([place]) 01/13/2007   Qualifier: History of  By: Danny Lawless CMA, [place]       Patient Active Problem List   Diagnosis Date Noted  . Sinus node dysfunction ([place]) 11/10/2015  . Atrial fibrillation ([place]) 10/03/2015  . Atrial fibrillation with RVR ([place]) 10/03/2015  . Polycythemia 07/25/2015  . NICM (nonischemic cardiomyopathy) ([place]) 07/04/2015  . Chronic anticoagulation 07/04/2015  . CKD (chronic kidney disease) stage 3, GFR 30-59 ml/min 01/24/2015  . Atypical atrial  flutter ([place])   . Encounter for therapeutic drug monitoring 06/26/2013  . Atrial flutter ([place]) 06/21/2013    Class: Acute  . Acute systolic heart failure ([place]) 06/21/2013  . Hearing loss 06/04/2013  . Routine adult health maintenance 07/30/2012  . Palpitation 07/18/2012  . Stress at home 07/18/2012  . Urinary urgency 03/12/2012  . Hypogonadism male 01/16/2010  . HYPERTENSION, BENIGN 08/21/2009    Class: Chronic  . PAF (paroxysmal atrial fibrillation) ([place]) 01/03/2008  . NEOPLASM OF UNCERTAIN BEHAVIOR OF SKIN 04/05/2007  . Hypothyroidism 01/13/2007  . Hyperlipidemia 01/13/2007  . Polymyalgia rheumatica ([place]) 01/13/2007  . INGUINAL HERNIORRHAPHY, HX OF 01/13/2007    Past Surgical History:  Procedure Laterality Date  . ATRIAL FIBRILLATION ABLATION  2007  . CARDIOVERSION N/A 06/22/2013   Procedure: CARDIOVERSION;  Surgeon: Sueanne Margarita, MD;  Location: [facility] ENDOSCOPY;  Service: Cardiovascular;  Laterality: N/A;  15:17 Lido 20mg ,IV , Propofol 30 mg, IV synched cardioversion @ 150 joules by Dr. Lajoyce Lauber from a flutter to sinis brady which is baseline for this pt, reports 45-50 reg HR verified by Dr. Ashok Norris  . CARDIOVERSION N/A 11/21/2014   Procedure: CARDIOVERSION;  Surgeon: Fay Records, MD;  Location: [facility] ENDOSCOPY;  Service: Cardiovascular;  Laterality: N/A;  . CARDIOVERSION N/A 07/14/2015   Procedure: CARDIOVERSION;  Surgeon: Sueanne Margarita, MD;  Location:  [facility] ENDOSCOPY;  Service: Cardiovascular;  Laterality: N/A;  . CATARACT EXTRACTION, BILATERAL Bilateral Summer 2009  . CHOLECYSTECTOMY    . EP IMPLANTABLE DEVICE N/A 11/10/2015   Procedure: Pacemaker Implant;  Surgeon: [name], MD;  Location: [place] [facility] CV LAB;  Service: Cardiovascular;  Laterality: N/A;  . HAND SURGERY Right    "broke bone; grafted area w/bone from somewhere else"  . HEMORRHOID SURGERY    . INGUINAL HERNIA REPAIR Bilateral   . TEE WITHOUT CARDIOVERSION N/A 06/22/2013   Procedure: TRANSESOPHAGEAL  ECHOCARDIOGRAM (TEE);  Surgeon: Sueanne Margarita, MD;  Location: [facility] ENDOSCOPY;  Service: Cardiovascular;  Laterality: N/A;  . TEE WITHOUT CARDIOVERSION N/A 07/14/2015   Procedure: TRANSESOPHAGEAL ECHOCARDIOGRAM (TEE);  Surgeon: Sueanne Margarita, MD;  Location: [facility] ENDOSCOPY;  Service: Cardiovascular;  Laterality: N/A;  . TONSILLECTOMY         Home Medications    Prior to Admission medications   Medication Sig Start Date End Date Taking? Authorizing Provider  amLODipine (NORVASC) 5 MG tablet take 1 tablet by mouth once daily 08/30/16   [name], MD  atorvastatin (LIPITOR) 10 MG tablet Take 1/2 tablet daily. 06/03/16   [name], MD  carboxymethylcellulose (REFRESH TEARS) 0.5 % SOLN Place 1 drop into both eyes 3 (three) times daily as needed (dry eyes).     Historical Provider, MD  cholecalciferol (VITAMIN D) 1000 UNITS tablet Take 1,000 Units by mouth every morning.    Historical Provider, MD  erythromycin ophthalmic ointment  06/12/16   Historical Provider, MD  furosemide (LASIX) 20 MG tablet Take 1 tablet (20 mg total) by mouth daily. 05/28/16   [name], MD  Glucosamine-Chondroitin 500-400 MG CAPS Take 1 capsule by mouth every morning.    Historical Provider, MD  levothyroxine (SYNTHROID, LEVOTHROID) 50 MCG tablet take 1 tablet by mouth once daily 07/12/16   [name], MD  metoprolol succinate (TOPROL-XL) 50 MG 24 hr tablet TAKE 1 AND 1/2 TABLETS BY MOUTH DAILY 05/11/16   [name], MD  Multiple Vitamins-Minerals (CENTRUM SILVER PO) Take 1 tablet by mouth every morning.     Historical Provider, MD  warfarin (COUMADIN) 2 MG tablet Take 1-4 mg by mouth See admin instructions. Take 4mg  ( two of the 2mg  tab) every day except on Mon, and Friday take half of the 1mg  per patient    Historical Provider, MD  warfarin (COUMADIN) 2 MG tablet take 1 and 1/2 to 2 tablets by mouth once daily as directed BY COUMADIN CLINIC 07/26/16   [name], MD    Family History Family  History  Problem Relation Age of Onset  . Emphysema Mother   . Diabetes Mother   . Kidney disease Father     Social History Social History  Substance Use Topics  . Smoking status: Former Smoker    Packs/day: 1.00    Years: 12.00    Types: Cigarettes    Quit date: 05/04/1963  . Smokeless tobacco: Never Used     Comment: quit cigars 72'   . Alcohol use Yes     Comment: 06/21/2013 "I was a moderate drinker at most; rarely have drink now"     Allergies   Patient has no known allergies.   Review of Systems Review of Systems  Eyes: Negative for visual disturbance.  Gastrointestinal: Negative for vomiting.  Musculoskeletal: Positive for arthralgias and joint swelling.  Neurological: Negative for dizziness, syncope, weakness, numbness and headaches.  All other systems  reviewed and are negative.    Physical Exam Updated Vital Signs BP (!) 157/96   Pulse 89   Temp 97.5 F (36.4 C) (Oral)   Resp 16   SpO2 99%   Physical Exam  Constitutional: He is oriented to person, place, and time. He appears well-developed and well-nourished.  HENT:  Head: Normocephalic.    Right Ear: External ear normal.  Left Ear: External ear normal.  Nose: Nose normal.    Eyes: Right eye exhibits no discharge. Left eye exhibits no discharge.  Neck: Neck supple.  Cardiovascular: Normal rate, regular rhythm and normal heart sounds.   Pulses:      Radial pulses are 2+ on the right side, and 2+ on the left side.  Pulmonary/Chest: Effort normal and breath sounds normal.  Abdominal: Soft. There is no tenderness.  Musculoskeletal: He exhibits no edema.       Right wrist: He exhibits tenderness and swelling (mild). He exhibits normal range of motion.       Right hand: He exhibits tenderness and swelling. He exhibits normal range of motion and no deformity.       Hands: Neurological: He is alert and oriented to person, place, and time.  CN 3-12 grossly intact. 5/5 strength in all 4 extremities.  Grossly normal sensation.   Skin: Skin is warm and dry. He is not diaphoretic.  Nursing note and vitals reviewed.    ED Treatments / Results  Labs (all labs ordered are listed, but only abnormal results are displayed) Labs Reviewed  PROTIME-INR - Abnormal; Notable for the following:       Result Value   Prothrombin Time 22.7 (*)    All other components within normal limits    EKG  EKG Interpretation None       Radiology Dg Wrist Complete Right  Result Date: 09/01/2016 CLINICAL DATA:  Status post fall walking the dog. EXAM: RIGHT WRIST - COMPLETE 3+ VIEW COMPARISON:  None. FINDINGS: Generalized osteopenia. Acute minimally displaced fracture of the distal dorsal triquetrum. No other fracture or dislocation. Mild osteoarthritis of the scaphotrapeziotrapezoid joint, first [place] joint and first MCP joint. Mild osteoarthritis of the fifth MCP joint. Mild osteoarthritis of the distal radioulnar joint. IMPRESSION: 1. Acute minimally displaced fracture of the distal dorsal triquetrum. Electronically Signed   By: Kathreen Devoid   On: 09/01/2016 12:28   Ct Head Wo Contrast  Result Date: 09/01/2016 CLINICAL DATA:  Forehead abrasion after tripping and falling today. On Plavix. Initial encounter. EXAM: CT HEAD WITHOUT CONTRAST TECHNIQUE: Contiguous axial images were obtained from the base of the skull through the vertex without intravenous contrast. COMPARISON:  None. FINDINGS: Brain: There is no evidence of acute intracranial hemorrhage, mass lesion, brain edema or extra-axial fluid collection. There is mild generalized atrophy and mild periventricular white matter disease, likely due to chronic small vessel ischemic changes. There is no CT evidence of acute cortical infarction. Vascular: Intracranial vascular calcifications are present. Skull: Negative for fracture or focal lesion. Sinuses/Orbits: The visualized paranasal sinuses and mastoid air cells are clear. No orbital abnormalities are seen. Other:  None. IMPRESSION: 1. No acute intracranial or calvarial findings. 2. Mild atrophy and chronic small vessel ischemic changes. Electronically Signed   By: Richardean Sale M.D.   On: 09/01/2016 13:27   Dg Hand Complete Right  Result Date: 09/01/2016 CLINICAL DATA:  Status post fall.  Fell walking the dog. EXAM: RIGHT HAND - COMPLETE 3+ VIEW COMPARISON:  None. FINDINGS: Generalized osteopenia. Acute minimally displaced  fracture of the distal dorsal triquetrum. No other fracture or dislocation. Mild osteoarthritis of the scaphotrapeziotrapezoid joint, first [place] joint and first MCP joint. Mild osteoarthritis of the fifth MCP joint. IMPRESSION: 1. Acute minimally displaced fracture of the distal dorsal triquetrum. Electronically Signed   By: Kathreen Devoid   On: 09/01/2016 12:27    Procedures Procedures (including critical care time)  Medications Ordered in ED Medications - No data to display   Initial Impression / Assessment and Plan / ED Course  I have reviewed the triage vital signs and the nursing notes.  Pertinent labs & imaging results that were available during my care of the patient were reviewed by me and considered in my medical decision making (see chart for details).     Xray shows triquetrium fracture, splinted. NV intact. Neuro exam benign, head CT unremarkable. Discussed possibility of delayed bleed and return precautions. Otherwise sounds like a mechanical fall. At this time continue coumadin as prescribed. Discussed return precautions, f/u with ortho for hand.  Final Clinical Impressions(s) / ED Diagnoses   Final diagnoses:  Fall, initial encounter  Closed displaced fracture of triquetrum of right wrist, initial encounter  Minor head injury, initial encounter    New Prescriptions Discharge Medication List as of 09/01/2016  2:48 PM       Sherwood Gambler, MD 09/01/16 1711

## 2016-09-01 NOTE — ED Notes (Signed)
PT aware that EDP will be in with him shortly

## 2016-09-01 NOTE — ED Notes (Signed)
Patient transported to X-ray 

## 2016-09-03 DIAGNOSIS — S62111A Displaced fracture of triquetrum [cuneiform] bone, right wrist, initial encounter for closed fracture: Secondary | ICD-10-CM | POA: Diagnosis not present

## 2016-09-21 ENCOUNTER — Telehealth: Payer: Self-pay | Admitting: Family Medicine

## 2016-09-21 ENCOUNTER — Ambulatory Visit (INDEPENDENT_AMBULATORY_CARE_PROVIDER_SITE_OTHER): Payer: Medicare Other

## 2016-09-21 DIAGNOSIS — I4892 Unspecified atrial flutter: Secondary | ICD-10-CM | POA: Diagnosis not present

## 2016-09-21 DIAGNOSIS — Z5181 Encounter for therapeutic drug level monitoring: Secondary | ICD-10-CM | POA: Diagnosis not present

## 2016-09-21 LAB — POCT INR: INR: 2.2

## 2016-09-21 MED ORDER — ATORVASTATIN CALCIUM 10 MG PO TABS: ORAL_TABLET | ORAL | 0 refills | Status: DC

## 2016-09-21 NOTE — Telephone Encounter (Signed)
Pt needs refill on atorvastatin 10 mg #90 w/REFILLS . Pt takes 1 tablet a day and not 1/2 tablet. [facility] [address]

## 2016-09-22 DIAGNOSIS — R350 Frequency of micturition: Secondary | ICD-10-CM | POA: Diagnosis not present

## 2016-09-22 DIAGNOSIS — N401 Enlarged prostate with lower urinary tract symptoms: Secondary | ICD-10-CM | POA: Diagnosis not present

## 2016-09-22 DIAGNOSIS — N5201 Erectile dysfunction due to arterial insufficiency: Secondary | ICD-10-CM | POA: Diagnosis not present

## 2016-09-22 DIAGNOSIS — N3281 Overactive bladder: Secondary | ICD-10-CM | POA: Diagnosis not present

## 2016-10-04 DIAGNOSIS — S62111D Displaced fracture of triquetrum [cuneiform] bone, right wrist, subsequent encounter for fracture with routine healing: Secondary | ICD-10-CM | POA: Diagnosis not present

## 2016-10-20 ENCOUNTER — Encounter: Payer: Self-pay | Admitting: Family Medicine

## 2016-10-20 ENCOUNTER — Ambulatory Visit (INDEPENDENT_AMBULATORY_CARE_PROVIDER_SITE_OTHER): Payer: Medicare Other | Admitting: Family Medicine

## 2016-10-20 VITALS — BP 120/70 | HR 78 | Temp 97.1°F | Ht 69.0 in | Wt 171.4 lb

## 2016-10-20 DIAGNOSIS — E039 Hypothyroidism, unspecified: Secondary | ICD-10-CM

## 2016-10-20 DIAGNOSIS — N401 Enlarged prostate with lower urinary tract symptoms: Secondary | ICD-10-CM

## 2016-10-20 DIAGNOSIS — N400 Enlarged prostate without lower urinary tract symptoms: Secondary | ICD-10-CM | POA: Insufficient documentation

## 2016-10-20 DIAGNOSIS — I10 Essential (primary) hypertension: Secondary | ICD-10-CM

## 2016-10-20 DIAGNOSIS — E785 Hyperlipidemia, unspecified: Secondary | ICD-10-CM | POA: Diagnosis not present

## 2016-10-20 LAB — BASIC METABOLIC PANEL
BUN: 23 mg/dL (ref 6–23)
CO2: 29 meq/L (ref 19–32)
Calcium: 9.4 mg/dL (ref 8.4–10.5)
Chloride: 102 mEq/L (ref 96–112)
Creatinine, Ser: 1.1 mg/dL (ref 0.40–1.50)
GFR: 67.03 mL/min (ref >60.00)
GLUCOSE: 98 mg/dL (ref 70–99)
POTASSIUM: 4.1 meq/L (ref 3.5–5.1)
SODIUM: 138 meq/L (ref 135–145)

## 2016-10-20 LAB — LIPID PANEL
CHOL/HDL RATIO: 4
Cholesterol: 141 mg/dL (ref 0–200)
HDL: 38.7 mg/dL — AB (ref >39.00)
LDL Cholesterol: 84 mg/dL (ref 0–99)
NONHDL: 102.33
Triglycerides: 92 mg/dL (ref 0.0–149.0)
VLDL: 18.4 mg/dL (ref 0.0–40.0)

## 2016-10-20 LAB — HEPATIC FUNCTION PANEL
ALBUMIN: 4.1 g/dL (ref 3.5–5.2)
ALT: 6 U/L (ref 0–53)
AST: 18 U/L (ref 0–37)
Alkaline Phosphatase: 69 U/L (ref 39–117)
BILIRUBIN TOTAL: 0.9 mg/dL (ref 0.2–1.2)
Bilirubin, Direct: 0.2 mg/dL (ref 0.0–0.3)
Total Protein: 6.4 g/dL (ref 6.0–8.3)

## 2016-10-20 NOTE — Progress Notes (Signed)
Subjective:     Patient ID: [name], male   DOB: May 17, 1925, 83 y.o.   MRN: [id number]  HPI Patient seen for routine medical follow-up. He had some problems with increased peripheral edema few months ago. We reduced his amlodipine from 10 to 5 mg and has done well since then. No recurrent edema. No increased dyspnea. No orthopnea. He has history of nonischemic cardiomyopathy, hypothyroidism, hypertension, chronic kidney disease, atrial fibrillation, chronic anticoagulation, hyperlipidemia.  His thyroid was assessed back in January and stable. He has history of BPH. Improved slightly on Flomax. Still has frequent nocturia. Followed by urology.  No recent chest pains. Did lose his balance and fell and may with fracture of bone in his right hand. He has been released by orthopedist. No further falls since then.  Past Medical History:  Diagnosis Date  . AF (atrial fibrillation) [facility])    a. s/p RFCA/PVI @ [facility] clinic 2007.  . Arthritis    "joints" (06/21/2013)  . Atrial flutter ([place])    a. 06/2013 s/p TEE/DCCV  . Chronic systolic CHF (congestive heart failure) ([place])    a. 06/2013 Echo: EF 20%, diff HK, mild LVH.  Marland Kitchen History of cardiovascular stress test    Lexiscan Myoview 8/16:  EF 59%, attenuation artifact, no ischemia; Low Risk  . Hyperlipidemia   . HYPERTENSION, BENIGN 08/21/2009   Qualifier: Diagnosis of  By: Caryl Comes, MD, Remus Blake  Medications(s)  enalapril   . Hypothyroidism   . Neoplasm of uncertain behavior    SKIN  . Polymyalgia rheumatica ([place]) 01/13/2007   Qualifier: History of  By: Danny Lawless CMA, [place]      Past Surgical History:  Procedure Laterality Date  . ATRIAL FIBRILLATION ABLATION  2007  . CARDIOVERSION N/A 06/22/2013   Procedure: CARDIOVERSION;  Surgeon: Sueanne Margarita, MD;  Location: [facility] ENDOSCOPY;  Service: Cardiovascular;  Laterality: N/A;  15:17 Lido 20mg ,IV , Propofol 30 mg, IV synched cardioversion @ 150 joules by Dr. Lajoyce Lauber from a flutter to sinis  brady which is baseline for this pt, reports 45-50 reg HR verified by Dr. Ashok Norris  . CARDIOVERSION N/A 11/21/2014   Procedure: CARDIOVERSION;  Surgeon: Fay Records, MD;  Location: [facility] ENDOSCOPY;  Service: Cardiovascular;  Laterality: N/A;  . CARDIOVERSION N/A 07/14/2015   Procedure: CARDIOVERSION;  Surgeon: Sueanne Margarita, MD;  Location: [place] [place];  Service: Cardiovascular;  Laterality: N/A;  . CATARACT EXTRACTION, BILATERAL Bilateral Summer 2009  . CHOLECYSTECTOMY    . EP IMPLANTABLE DEVICE N/A 11/10/2015   Procedure: Pacemaker Implant;  Surgeon: [name], MD;  Location: [place] CV LAB;  Service: Cardiovascular;  Laterality: N/A;  . HAND SURGERY Right    "broke bone; grafted area w/bone from somewhere else"  . HEMORRHOID SURGERY    . INGUINAL HERNIA REPAIR Bilateral   . TEE WITHOUT CARDIOVERSION N/A 06/22/2013   Procedure: TRANSESOPHAGEAL ECHOCARDIOGRAM (TEE);  Surgeon: Sueanne Margarita, MD;  Location: [facility] ENDOSCOPY;  Service: Cardiovascular;  Laterality: N/A;  . TEE WITHOUT CARDIOVERSION N/A 07/14/2015   Procedure: TRANSESOPHAGEAL ECHOCARDIOGRAM (TEE);  Surgeon: Sueanne Margarita, MD;  Location[facility] ENDOSCOPY;  Service: Cardiovascular;  Laterality: N/A;  . TONSILLECTOMY      reports that he quit smoking about 53 years ago. His smoking use included Cigarettes. He has a 12.00 pack-year smoking history. He has never used smokeless tobacco. He reports that he drinks alcohol. He reports that he does not use drugs. family history includes Diabetes in his mother; Emphysema in  his mother; Kidney disease in his father. No Known Allergies   Review of Systems  Constitutional: Negative for activity change, appetite change, fatigue and fever.  HENT: Negative for congestion, ear pain and trouble swallowing.   Eyes: Negative for pain and visual disturbance.  Respiratory: Negative for cough, shortness of breath and wheezing.   Cardiovascular: Negative for chest pain and palpitations.   Gastrointestinal: Negative for abdominal distention, abdominal pain, blood in stool, constipation, diarrhea, nausea, rectal pain and vomiting.  Genitourinary: Positive for frequency. Negative for dysuria, hematuria and testicular pain.  Musculoskeletal: Negative for arthralgias and joint swelling.  Skin: Negative for rash.  Neurological: Negative for dizziness, syncope and headaches.  Hematological: Negative for adenopathy.  Psychiatric/Behavioral: Negative for confusion and dysphoric mood.       Objective:   Physical Exam  Constitutional: He is oriented to person, place, and time. He appears well-developed and well-nourished.  HENT:  Mouth/Throat: Oropharynx is clear and moist.  Cardiovascular: Normal rate and regular rhythm.   Pulmonary/Chest: Effort normal and breath sounds normal. No respiratory distress. He has no wheezes. He has no rales.  Musculoskeletal: He exhibits no edema.  Neurological: He is alert and oriented to person, place, and time.  Psychiatric: He has a normal mood and affect. His behavior is normal.       Assessment:     #1 hypertension stable and at goal  #2 history of nonischemic cardiomyopathy. Peripheral edema from few months ago improved after reduction in amlodipine  #3 dyslipidemia  #4 BPH symptomatically stable    Plan:     -Recheck labs with basic metabolic panel, lipid panel, hepatic panel -Continue daily weights -Follow-up promptly for any increased edema or dyspnea -Routine follow-up in 6 months and sooner as needed -We'll continue regular follow-up with Coumadin clinic  [name] MD [facility]

## 2016-10-21 ENCOUNTER — Encounter: Payer: Self-pay | Admitting: Family Medicine

## 2016-11-02 ENCOUNTER — Ambulatory Visit (INDEPENDENT_AMBULATORY_CARE_PROVIDER_SITE_OTHER): Payer: Medicare Other | Admitting: *Deleted

## 2016-11-02 DIAGNOSIS — I4892 Unspecified atrial flutter: Secondary | ICD-10-CM

## 2016-11-02 DIAGNOSIS — Z5181 Encounter for therapeutic drug level monitoring: Secondary | ICD-10-CM | POA: Diagnosis not present

## 2016-11-02 DIAGNOSIS — S62111D Displaced fracture of triquetrum [cuneiform] bone, right wrist, subsequent encounter for fracture with routine healing: Secondary | ICD-10-CM | POA: Diagnosis not present

## 2016-11-02 LAB — POCT INR: INR: 2.2

## 2016-11-12 ENCOUNTER — Encounter: Payer: Medicare Other | Admitting: Internal Medicine

## 2016-11-15 ENCOUNTER — Ambulatory Visit (INDEPENDENT_AMBULATORY_CARE_PROVIDER_SITE_OTHER): Payer: Medicare Other | Admitting: *Deleted

## 2016-11-15 DIAGNOSIS — I495 Sick sinus syndrome: Secondary | ICD-10-CM | POA: Diagnosis not present

## 2016-11-15 NOTE — Progress Notes (Signed)
Remote pacemaker transmission.   

## 2016-11-16 ENCOUNTER — Ambulatory Visit (INDEPENDENT_AMBULATORY_CARE_PROVIDER_SITE_OTHER): Payer: Medicare Other | Admitting: Internal Medicine

## 2016-11-16 ENCOUNTER — Encounter: Payer: Self-pay | Admitting: Internal Medicine

## 2016-11-16 VITALS — BP 140/70 | HR 65 | Ht 69.0 in | Wt 171.0 lb

## 2016-11-16 DIAGNOSIS — I428 Other cardiomyopathies: Secondary | ICD-10-CM | POA: Diagnosis not present

## 2016-11-16 DIAGNOSIS — I495 Sick sinus syndrome: Secondary | ICD-10-CM

## 2016-11-16 DIAGNOSIS — Z950 Presence of cardiac pacemaker: Secondary | ICD-10-CM | POA: Diagnosis not present

## 2016-11-16 DIAGNOSIS — I480 Paroxysmal atrial fibrillation: Secondary | ICD-10-CM | POA: Diagnosis not present

## 2016-11-16 DIAGNOSIS — R001 Bradycardia, unspecified: Secondary | ICD-10-CM | POA: Diagnosis not present

## 2016-11-16 LAB — CUP PACEART REMOTE DEVICE CHECK
Battery Remaining Longevity: 104 mo
Battery Voltage: 3.02 V
Brady Statistic AP VS Percent: 93.84 %
Brady Statistic RA Percent Paced: 94.08 %
Date Time Interrogation Session: 20180716124536
Implantable Lead Implant Date: 20170710
Implantable Lead Implant Date: 20170710
Implantable Lead Location: 753859
Implantable Lead Model: 3830
Implantable Pulse Generator Implant Date: 20170710
Lead Channel Pacing Threshold Amplitude: 0.625 V
Lead Channel Pacing Threshold Pulse Width: 0.4 ms
Lead Channel Sensing Intrinsic Amplitude: 2.75 mV
Lead Channel Sensing Intrinsic Amplitude: 2.75 mV
Lead Channel Setting Sensing Sensitivity: 0.9 mV
MDC IDC LEAD LOCATION: 753860
MDC IDC MSMT LEADCHNL RA IMPEDANCE VALUE: 361 Ohm
MDC IDC MSMT LEADCHNL RA IMPEDANCE VALUE: 437 Ohm
MDC IDC MSMT LEADCHNL RV IMPEDANCE VALUE: 285 Ohm
MDC IDC MSMT LEADCHNL RV IMPEDANCE VALUE: 475 Ohm
MDC IDC MSMT LEADCHNL RV PACING THRESHOLD AMPLITUDE: 0.5 V
MDC IDC MSMT LEADCHNL RV PACING THRESHOLD PULSEWIDTH: 0.4 ms
MDC IDC MSMT LEADCHNL RV SENSING INTR AMPL: 5.875 mV
MDC IDC MSMT LEADCHNL RV SENSING INTR AMPL: 5.875 mV
MDC IDC SET LEADCHNL RA PACING AMPLITUDE: 1.5 V
MDC IDC SET LEADCHNL RV PACING AMPLITUDE: 2.5 V
MDC IDC SET LEADCHNL RV PACING PULSEWIDTH: 0.4 ms
MDC IDC STAT BRADY AP VP PERCENT: 0.45 %
MDC IDC STAT BRADY AS VP PERCENT: 0 %
MDC IDC STAT BRADY AS VS PERCENT: 5.7 %
MDC IDC STAT BRADY RV PERCENT PACED: 0.61 %

## 2016-11-16 MED ORDER — METOPROLOL SUCCINATE ER 50 MG PO TB24: 50.0000 mg | ORAL_TABLET | Freq: Every day | ORAL | Status: DC

## 2016-11-16 NOTE — Patient Instructions (Addendum)
Medication Instructions: - Your physician has recommended you make the following change in your medication:  1) Decrease metoprolol succinate 50 mg - take 1 tablet (50 mg) by mouth once daily  Labwork: - none ordered  Procedures/Testing: - none ordered  Follow-Up: - Remote monitoring is used to monitor your Pacemaker of ICD from home. This monitoring reduces the number of office visits required to check your device to one time per year. It allows [place] to keep an eye on the functioning of your device to ensure it is working properly. You are scheduled for a device check from home on 02/14/17. You may send your transmission at any time that day. If you have a wireless device, the transmission will be sent automatically. After your physician reviews your transmission, you will receive a postcard with your next transmission date.   - Your physician wants you to follow-up in: 1 year with Dr. Caryl Comes.  You will receive a reminder letter in the mail two months in advance. If you don't receive a letter, please call our office to schedule the follow-up appointment.   Any Additional Special Instructions Will Be Listed Below (If Applicable).     If you need a refill on your cardiac medications before your next appointment, please call your pharmacy.

## 2016-11-16 NOTE — Progress Notes (Signed)
Patient Care Team: [name], MD as PCP - General (Family Medicine) [name], MD (Ophthalmology) [name], MD (Orthopedic Surgery) [name], MD (Cardiology) [name], MD (General Surgery)   HPI  [name] is a 83 y.o. male Seen in followup of atypical atrial flutter and sinus node dysfunction resulting in His bundle pacemaker implantation 6/17.  He has not noticed much improvement in exercise tolerance following pacemaker implantation.  Prior PVI ([facility]-2007)>> Rx amiodarone+warfarin >>>TEE-DCCV   More recently he has been on warfarin alone.  He had been seen in A. fib clinic on referral from the PA having been found in  atypical atrial flutter.  Cardioversion was arranged but in the interim he reverted to sinus rhythm.     No interval Afib  The patient denies chest pain, shortness of breath, nocturnal dyspnea, orthopnea or peripheral edema.  There have been no palpitations, lightheadedness or syncope.    He did h ave a terrible fall taking the dogs for a walk    Significant stressors related to his daughter and her psychiatric hospitalizations     DATE TEST    2/15 Echo ER 20%   5/15 Echo    EF 55-60 %   8/16    myoview   EF 59 %  NO ISCHEMIA  3/17 TEE EF 40-45%    Date Cr Hgb  6/18 1.1        Past Medical History:  Diagnosis Date  . AF (atrial fibrillation) [facility])    a. s/p RFCA/PVI @ [facility] clinic 2007.  . Arthritis    "joints" (06/21/2013)  . Atrial flutter ([place])    a. 06/2013 s/p TEE/DCCV  . Chronic systolic CHF (congestive heart failure) ([place])    a. 06/2013 Echo: EF 20%, diff HK, mild LVH.  Marland Kitchen History of cardiovascular stress test    Lexiscan Myoview 8/16:  EF 59%, attenuation artifact, no ischemia; Low Risk  . Hyperlipidemia   . HYPERTENSION, BENIGN 08/21/2009   Qualifier: Diagnosis of  By: Caryl Comes, MD, Remus Blake  Medications(s)  enalapril   . Hypothyroidism   . Neoplasm of uncertain  behavior    SKIN  . Polymyalgia rheumatica ([place]) 01/13/2007   Qualifier: History of  By: Danny Lawless CMA, [place]       Past Surgical History:  Procedure Laterality Date  . ATRIAL FIBRILLATION ABLATION  2007  . CARDIOVERSION N/A 06/22/2013   Procedure: CARDIOVERSION;  Surgeon: Sueanne Margarita, MD;  Location: [facility] ENDOSCOPY;  Service: Cardiovascular;  Laterality: N/A;  15:17 Lido 20mg ,IV , Propofol 30 mg, IV synched cardioversion @ 150 joules by Dr. Lajoyce Lauber from a flutter to sinis brady which is baseline for this pt, reports 45-50 reg HR verified by Dr. Ashok Norris  . CARDIOVERSION N/A 11/21/2014   Procedure: CARDIOVERSION;  Surgeon: Fay Records, MD;  Location: [facility] ENDOSCOPY;  Service: Cardiovascular;  Laterality: N/A;  . CARDIOVERSION N/A 07/14/2015   Procedure: CARDIOVERSION;  Surgeon: Sueanne Margarita, MD;  Location: [facility];  Service: Cardiovascular;  Laterality: N/A;  . CATARACT EXTRACTION, BILATERAL Bilateral Summer 2009  . CHOLECYSTECTOMY    . EP IMPLANTABLE DEVICE N/A 11/10/2015   Procedure: Pacemaker Implant;  Surgeon: [name], MD;  Location: [facility];  Service: Cardiovascular;  Laterality: N/A;  . HAND SURGERY Right    "broke bone; grafted area w/bone from somewhere else"  . HEMORRHOID SURGERY    . INGUINAL HERNIA REPAIR Bilateral   .  TEE WITHOUT CARDIOVERSION N/A 06/22/2013   Procedure: TRANSESOPHAGEAL ECHOCARDIOGRAM (TEE);  Surgeon: Sueanne Margarita, MD;  Location: [facility] ENDOSCOPY;  Service: Cardiovascular;  Laterality: N/A;  . TEE WITHOUT CARDIOVERSION N/A 07/14/2015   Procedure: TRANSESOPHAGEAL ECHOCARDIOGRAM (TEE);  Surgeon: Sueanne Margarita, MD;  Location: [facility] ENDOSCOPY;  Service: Cardiovascular;  Laterality: N/A;  . TONSILLECTOMY      Current Outpatient Prescriptions  Medication Sig Dispense Refill  . amLODipine (NORVASC) 5 MG tablet take 1 tablet by mouth once daily 30 tablet 5  . atorvastatin (LIPITOR) 10 MG tablet Take 1 tablet daily. 90 tablet 0  .  carboxymethylcellulose (REFRESH TEARS) 0.5 % SOLN Place 1 drop into both eyes 3 (three) times daily as needed (dry eyes).     . cholecalciferol (VITAMIN D) 1000 UNITS tablet Take 1,000 Units by mouth every morning.    Marland Kitchen erythromycin ophthalmic ointment     . furosemide (LASIX) 20 MG tablet Take 1 tablet (20 mg total) by mouth daily. 30 tablet 3  . Glucosamine-Chondroitin 500-400 MG CAPS Take 1 capsule by mouth every morning.    Marland Kitchen levothyroxine (SYNTHROID, LEVOTHROID) 50 MCG tablet take 1 tablet by mouth once daily 90 tablet 1  . metoprolol succinate (TOPROL-XL) 50 MG 24 hr tablet TAKE 1 AND 1/2 TABLETS BY MOUTH DAILY 45 tablet 9  . Multiple Vitamins-Minerals (CENTRUM SILVER PO) Take 1 tablet by mouth every morning.     . tamsulosin (FLOMAX) 0.4 MG CAPS capsule Take 0.4 mg by mouth daily.  0  . warfarin (COUMADIN) 2 MG tablet take 1 and 1/2 to 2 tablets by mouth once daily as directed BY COUMADIN CLINIC 180 tablet 1   No current facility-administered medications for this visit.     No Known Allergies  Review of Systems negative except from HPI and PMH  Physical Exam BP 140/70   Pulse 65   Ht 5\' 9"  (1.753 m)   Wt 171 lb (77.6 kg)   SpO2 97%   BMI 25.25 kg/m  Well developed and nourished in no acute distress HENT normal Neck supple with JVP-flat Carotids brisk and full without bruits Clear Device pocket well healed; without hematoma or erythema.  There is no tethering Regular rate and rhythm, no murmurs or gallops Abd-soft with active BS without hepatomegaly No Clubbing cyanosis tr edema Skin-warm and dry A & Oriented  Grossly normal sensory and motor function   ECG  [id number] 20/09/41   Assessment &  Plan  Atrial Flutter   sinus bradycardia   NICM thought to be rate related  Resolved  Dyspnea on exertion  Pacemaker-[employer] (DOI 7/17)  Hypertension.  No intercurrent atrial fibrillation or flutter  Euvolemic continue current meds  Blood Pressure well controlled     Blood pressure at home 110s or so with some light headedness   Will decrease metoprolol 75>>50

## 2016-11-17 ENCOUNTER — Encounter: Payer: Self-pay | Admitting: Cardiology

## 2016-12-14 ENCOUNTER — Other Ambulatory Visit: Payer: Self-pay | Admitting: *Deleted

## 2016-12-14 ENCOUNTER — Ambulatory Visit (INDEPENDENT_AMBULATORY_CARE_PROVIDER_SITE_OTHER): Payer: Medicare Other

## 2016-12-14 DIAGNOSIS — I4892 Unspecified atrial flutter: Secondary | ICD-10-CM | POA: Diagnosis not present

## 2016-12-14 DIAGNOSIS — Z5181 Encounter for therapeutic drug level monitoring: Secondary | ICD-10-CM

## 2016-12-14 LAB — POCT INR: INR: 1.9

## 2016-12-14 MED ORDER — ATORVASTATIN CALCIUM 10 MG PO TABS: ORAL_TABLET | ORAL | 1 refills | Status: DC

## 2017-01-10 ENCOUNTER — Other Ambulatory Visit: Payer: Self-pay | Admitting: Family Medicine

## 2017-01-12 DIAGNOSIS — H5211 Myopia, right eye: Secondary | ICD-10-CM | POA: Diagnosis not present

## 2017-01-12 DIAGNOSIS — H524 Presbyopia: Secondary | ICD-10-CM | POA: Diagnosis not present

## 2017-01-12 DIAGNOSIS — H04123 Dry eye syndrome of bilateral lacrimal glands: Secondary | ICD-10-CM | POA: Diagnosis not present

## 2017-01-12 DIAGNOSIS — H26492 Other secondary cataract, left eye: Secondary | ICD-10-CM | POA: Diagnosis not present

## 2017-01-12 DIAGNOSIS — H53032 Strabismic amblyopia, left eye: Secondary | ICD-10-CM | POA: Diagnosis not present

## 2017-01-12 DIAGNOSIS — H52221 Regular astigmatism, right eye: Secondary | ICD-10-CM | POA: Diagnosis not present

## 2017-01-12 DIAGNOSIS — H02102 Unspecified ectropion of right lower eyelid: Secondary | ICD-10-CM | POA: Diagnosis not present

## 2017-01-20 ENCOUNTER — Encounter: Payer: Self-pay | Admitting: Family Medicine

## 2017-01-25 ENCOUNTER — Ambulatory Visit (INDEPENDENT_AMBULATORY_CARE_PROVIDER_SITE_OTHER): Payer: Medicare Other | Admitting: *Deleted

## 2017-01-25 DIAGNOSIS — I4892 Unspecified atrial flutter: Secondary | ICD-10-CM

## 2017-01-25 DIAGNOSIS — Z5181 Encounter for therapeutic drug level monitoring: Secondary | ICD-10-CM | POA: Diagnosis not present

## 2017-01-25 LAB — POCT INR: INR: 1.9

## 2017-01-30 ENCOUNTER — Other Ambulatory Visit: Payer: Self-pay | Admitting: Family Medicine

## 2017-01-31 ENCOUNTER — Other Ambulatory Visit: Payer: Self-pay

## 2017-01-31 MED ORDER — WARFARIN SODIUM 2 MG PO TABS: ORAL_TABLET | ORAL | 1 refills | Status: DC

## 2017-02-02 ENCOUNTER — Other Ambulatory Visit: Payer: Self-pay | Admitting: *Deleted

## 2017-02-02 MED ORDER — WARFARIN SODIUM 2 MG PO TABS: ORAL_TABLET | ORAL | 1 refills | Status: DC

## 2017-02-14 ENCOUNTER — Ambulatory Visit (INDEPENDENT_AMBULATORY_CARE_PROVIDER_SITE_OTHER): Payer: Medicare Other | Admitting: *Deleted

## 2017-02-14 DIAGNOSIS — I495 Sick sinus syndrome: Secondary | ICD-10-CM

## 2017-02-14 NOTE — Progress Notes (Signed)
Remote pacemaker transmission.   

## 2017-02-16 LAB — CUP PACEART REMOTE DEVICE CHECK
Battery Remaining Longevity: 99 mo
Brady Statistic AP VS Percent: 91.95 %
Brady Statistic AS VS Percent: 8.02 %
Brady Statistic RA Percent Paced: 91.94 %
Brady Statistic RV Percent Paced: 0.03 %
Date Time Interrogation Session: 20181015133042
Implantable Lead Implant Date: 20170710
Implantable Lead Location: 753859
Lead Channel Impedance Value: 361 Ohm
Lead Channel Pacing Threshold Amplitude: 0.5 V
Lead Channel Pacing Threshold Pulse Width: 0.4 ms
Lead Channel Pacing Threshold Pulse Width: 0.4 ms
Lead Channel Sensing Intrinsic Amplitude: 4 mV
Lead Channel Sensing Intrinsic Amplitude: 4 mV
Lead Channel Sensing Intrinsic Amplitude: 6 mV
Lead Channel Setting Pacing Amplitude: 3.5 V
MDC IDC LEAD IMPLANT DT: 20170710
MDC IDC LEAD LOCATION: 753860
MDC IDC MSMT BATTERY VOLTAGE: 3.01 V
MDC IDC MSMT LEADCHNL RA IMPEDANCE VALUE: 437 Ohm
MDC IDC MSMT LEADCHNL RA PACING THRESHOLD AMPLITUDE: 0.625 V
MDC IDC MSMT LEADCHNL RV IMPEDANCE VALUE: 285 Ohm
MDC IDC MSMT LEADCHNL RV IMPEDANCE VALUE: 437 Ohm
MDC IDC MSMT LEADCHNL RV SENSING INTR AMPL: 6 mV
MDC IDC PG IMPLANT DT: 20170710
MDC IDC SET LEADCHNL RA PACING AMPLITUDE: 1.5 V
MDC IDC SET LEADCHNL RV PACING PULSEWIDTH: 1 ms
MDC IDC SET LEADCHNL RV SENSING SENSITIVITY: 0.9 mV
MDC IDC STAT BRADY AP VP PERCENT: 0.03 %
MDC IDC STAT BRADY AS VP PERCENT: 0 %

## 2017-02-18 ENCOUNTER — Encounter: Payer: Self-pay | Admitting: Cardiology

## 2017-02-22 ENCOUNTER — Ambulatory Visit (INDEPENDENT_AMBULATORY_CARE_PROVIDER_SITE_OTHER): Payer: Medicare Other | Admitting: Pharmacist

## 2017-02-22 DIAGNOSIS — Z5181 Encounter for therapeutic drug level monitoring: Secondary | ICD-10-CM

## 2017-02-22 DIAGNOSIS — I4892 Unspecified atrial flutter: Secondary | ICD-10-CM | POA: Diagnosis not present

## 2017-02-22 LAB — POCT INR: INR: 2.8

## 2017-02-28 ENCOUNTER — Other Ambulatory Visit: Payer: Self-pay

## 2017-02-28 MED ORDER — AMLODIPINE BESYLATE 5 MG PO TABS: 5.0000 mg | ORAL_TABLET | Freq: Every day | ORAL | 9 refills | Status: DC

## 2017-03-04 ENCOUNTER — Ambulatory Visit (INDEPENDENT_AMBULATORY_CARE_PROVIDER_SITE_OTHER): Payer: Medicare Other

## 2017-03-04 DIAGNOSIS — Z23 Encounter for immunization: Secondary | ICD-10-CM

## 2017-03-06 ENCOUNTER — Encounter (HOSPITAL_COMMUNITY): Payer: Self-pay | Admitting: Emergency Medicine

## 2017-03-06 ENCOUNTER — Emergency Department (HOSPITAL_COMMUNITY)
Admission: EM | Admit: 2017-03-06 | Discharge: 2017-03-07 | Disposition: A | Discharge status: Home / Self Care | Payer: Medicare Other | Attending: Emergency Medicine | Admitting: Emergency Medicine

## 2017-03-06 DIAGNOSIS — R1013 Epigastric pain: Secondary | ICD-10-CM | POA: Insufficient documentation

## 2017-03-06 DIAGNOSIS — R109 Unspecified abdominal pain: Secondary | ICD-10-CM | POA: Diagnosis present

## 2017-03-06 DIAGNOSIS — N183 Chronic kidney disease, stage 3 (moderate): Secondary | ICD-10-CM | POA: Diagnosis not present

## 2017-03-06 DIAGNOSIS — Z79899 Other long term (current) drug therapy: Secondary | ICD-10-CM | POA: Diagnosis not present

## 2017-03-06 DIAGNOSIS — Z87891 Personal history of nicotine dependence: Secondary | ICD-10-CM | POA: Insufficient documentation

## 2017-03-06 DIAGNOSIS — R197 Diarrhea, unspecified: Secondary | ICD-10-CM | POA: Diagnosis not present

## 2017-03-06 DIAGNOSIS — Z7901 Long term (current) use of anticoagulants: Secondary | ICD-10-CM | POA: Diagnosis not present

## 2017-03-06 DIAGNOSIS — R112 Nausea with vomiting, unspecified: Secondary | ICD-10-CM | POA: Diagnosis not present

## 2017-03-06 DIAGNOSIS — I130 Hypertensive heart and chronic kidney disease with heart failure and stage 1 through stage 4 chronic kidney disease, or unspecified chronic kidney disease: Secondary | ICD-10-CM | POA: Diagnosis not present

## 2017-03-06 DIAGNOSIS — E039 Hypothyroidism, unspecified: Secondary | ICD-10-CM | POA: Insufficient documentation

## 2017-03-06 DIAGNOSIS — I5022 Chronic systolic (congestive) heart failure: Secondary | ICD-10-CM | POA: Diagnosis not present

## 2017-03-06 LAB — URINALYSIS, ROUTINE W REFLEX MICROSCOPIC
BACTERIA UA: NONE SEEN
BILIRUBIN URINE: NEGATIVE
Glucose, UA: NEGATIVE mg/dL
HGB URINE DIPSTICK: NEGATIVE
Ketones, ur: 20 mg/dL — AB
LEUKOCYTES UA: NEGATIVE
NITRITE: NEGATIVE
PROTEIN: 30 mg/dL — AB
SQUAMOUS EPITHELIAL / LPF: NONE SEEN
Specific Gravity, Urine: 1.023 (ref 1.005–1.030)
pH: 7 (ref 5.0–8.0)

## 2017-03-06 LAB — CBC WITH DIFFERENTIAL/PLATELET
Basophils Absolute: 0 10*3/uL (ref 0.0–0.1)
Basophils Relative: 0 %
EOS ABS: 0 10*3/uL (ref 0.0–0.7)
EOS PCT: 0 %
HEMATOCRIT: 42.5 % (ref 39.0–52.0)
HEMOGLOBIN: 14 g/dL (ref 13.0–17.0)
LYMPHS ABS: 0.7 10*3/uL (ref 0.7–4.0)
LYMPHS PCT: 8 %
MCH: 29.9 pg (ref 26.0–34.0)
MCHC: 32.9 g/dL (ref 30.0–36.0)
MCV: 90.8 fL (ref 78.0–100.0)
MONO ABS: 0.4 10*3/uL (ref 0.1–1.0)
MONOS PCT: 5 %
Neutro Abs: 7.3 10*3/uL (ref 1.7–7.7)
Neutrophils Relative %: 87 %
Platelets: 155 10*3/uL (ref 150–400)
RBC: 4.68 MIL/uL (ref 4.22–5.81)
RDW: 14 % (ref 11.5–15.5)
WBC: 8.5 10*3/uL (ref 4.0–10.5)

## 2017-03-06 LAB — COMPREHENSIVE METABOLIC PANEL
ALBUMIN: 4.3 g/dL (ref 3.5–5.0)
ALK PHOS: 81 U/L (ref 38–126)
ALT: 11 U/L — ABNORMAL LOW (ref 17–63)
ANION GAP: 9 (ref 5–15)
AST: 26 U/L (ref 15–41)
BILIRUBIN TOTAL: 1.3 mg/dL — AB (ref 0.3–1.2)
BUN: 24 mg/dL — ABNORMAL HIGH (ref 6–20)
CALCIUM: 9.7 mg/dL (ref 8.9–10.3)
CO2: 31 mmol/L (ref 22–32)
Chloride: 100 mmol/L — ABNORMAL LOW (ref 101–111)
Creatinine, Ser: 1.2 mg/dL (ref 0.61–1.24)
GFR calc Af Amer: 60 mL/min — ABNORMAL LOW (ref >60)
GFR calc non Af Amer: 52 mL/min — ABNORMAL LOW (ref >60)
GLUCOSE: 144 mg/dL — AB (ref 65–99)
POTASSIUM: 4.2 mmol/L (ref 3.5–5.1)
SODIUM: 140 mmol/L (ref 135–145)
Total Protein: 7.4 g/dL (ref 6.5–8.1)

## 2017-03-06 LAB — LIPASE, BLOOD: Lipase: 21 U/L (ref 11–51)

## 2017-03-06 NOTE — ED Triage Notes (Signed)
Patient reports generalized abdominal pain with emesis and diarrhea today , denies fever or chills .

## 2017-03-07 DIAGNOSIS — R1013 Epigastric pain: Secondary | ICD-10-CM | POA: Diagnosis not present

## 2017-03-07 MED ORDER — ONDANSETRON HCL 4 MG/2ML IJ SOLN
4.0000 mg | Freq: Once | INTRAMUSCULAR | Status: AC
  Administered 2017-03-07: 4 mg via INTRAVENOUS
  Filled 2017-03-07: qty 2

## 2017-03-07 MED ORDER — LACTATED RINGERS IV BOLUS (SEPSIS)
1000.0000 mL | Freq: Once | INTRAVENOUS | Status: AC
  Administered 2017-03-07: 1000 mL via INTRAVENOUS

## 2017-03-07 MED ORDER — GI COCKTAIL ~~LOC~~
30.0000 mL | Freq: Once | ORAL | Status: AC
  Administered 2017-03-07: 30 mL via ORAL
  Filled 2017-03-07: qty 30

## 2017-03-07 MED ORDER — ONDANSETRON 4 MG PO TBDP
4.0000 mg | ORAL_TABLET | Freq: Three times a day (TID) | ORAL | 0 refills | Status: DC | PRN
Start: 2017-03-07 — End: 2018-09-21

## 2017-03-07 NOTE — ED Notes (Signed)
Pt reports he started to have nausea and vomiting at 1500 this date. Pt reports throwing up 5 times.

## 2017-03-07 NOTE — Discharge Instructions (Signed)
We saw you in the ER for the abdominal pain, vomiting and diarrhea. All the results in the ER are normal, labs and imaging, except for mild dehydration. We are not sure what is causing your symptoms. The workup in the ER is not complete, and is limited to screening for life threatening and emergent conditions only, so please see a primary care doctor for further evaluation.  Please return to the ER immediately if you start having repeat abdominal pain, bloody stools or bloody emesis.

## 2017-03-07 NOTE — ED Provider Notes (Signed)
[facility] EMERGENCY DEPARTMENT Provider Note   CSN: [id number] Arrival date & time: 03/06/17  2153     History   Chief Complaint Chief Complaint  Patient presents with  . Abdominal Pain  . Emesis  . Diarrhea    HPI [name] is a 83 y.o. male.  HPI Patient with history of A. fib, CHF with EF of 20%, hypertension, hyperlipidemia comes in with chief complaint of abdominal pain, nausea and emesis, diarrhea. Patient reports that his symptoms started about 4 in the afternoon.  Patient started having epigastric abdominal pain which is nonradiating.  Pain is described as moderately severe abdominal pain, with associated nausea and emesis and diarrhea.  Patient had about 3 episodes of emesis prior to emergency room arrival, and the last one was bilious.  Patient also has had about 3 loose bowel movements which were non-bloody.  Patient denies any sick contacts.  Patient has history of cholecystectomy and hernia repair.   Past Medical History:  Diagnosis Date  . AF (atrial fibrillation) [facility])    a. s/p RFCA/PVI @ [facility] clinic 2007.  . Arthritis    "joints" (06/21/2013)  . Atrial flutter ([place])    a. 06/2013 s/p TEE/DCCV  . Chronic systolic CHF (congestive heart failure) ([place])    a. 06/2013 Echo: EF 20%, diff HK, mild LVH.  Marland Kitchen History of cardiovascular stress test    Lexiscan Myoview 8/16:  EF 59%, attenuation artifact, no ischemia; Low Risk  . Hyperlipidemia   . HYPERTENSION, BENIGN 08/21/2009   Qualifier: Diagnosis of  By: Caryl Comes, MD, Remus Blake  Medications(s)  enalapril   . Hypothyroidism   . Neoplasm of uncertain behavior    SKIN  . Polymyalgia rheumatica ([place]) 01/13/2007   Qualifier: History of  By: Danny Lawless CMA, [place]       Patient Active Problem List   Diagnosis Date Noted  . BPH (benign prostatic hyperplasia) 10/20/2016  . Sinus node dysfunction ([place]) 11/10/2015  . Atrial fibrillation ([place]) 10/03/2015  . Atrial fibrillation with RVR  ([place]) 10/03/2015  . Polycythemia 07/25/2015  . NICM (nonischemic cardiomyopathy) ([place]) 07/04/2015  . Chronic anticoagulation 07/04/2015  . CKD (chronic kidney disease) stage 3, GFR 30-59 ml/min (H[facility]) 01/24/2015  . Atypical atrial flutter ([place])   . Encounter for therapeutic drug monitoring 06/26/2013  . Atrial flutter ([place]) 06/21/2013    Class: Acute  . Acute systolic heart failure ([place]) 06/21/2013  . Hearing loss 06/04/2013  . Routine adult health maintenance 07/30/2012  . Palpitation 07/18/2012  . Stress at home 07/18/2012  . Urinary urgency 03/12/2012  . Hypogonadism male 01/16/2010  . HYPERTENSION, BENIGN 08/21/2009    Class: Chronic  . PAF (paroxysmal atrial fibrillation) ([place]) 01/03/2008  . NEOPLASM OF UNCERTAIN BEHAVIOR OF SKIN 04/05/2007  . Hypothyroidism 01/13/2007  . Hyperlipidemia 01/13/2007  . Polymyalgia rheumatica ([place]) 01/13/2007  . INGUINAL HERNIORRHAPHY, HX OF 01/13/2007    Past Surgical History:  Procedure Laterality Date  . ATRIAL FIBRILLATION ABLATION  2007  . CATARACT EXTRACTION, BILATERAL Bilateral Summer 2009  . CHOLECYSTECTOMY    . HAND SURGERY Right    "broke bone; grafted area w/bone from somewhere else"  . HEMORRHOID SURGERY    . INGUINAL HERNIA REPAIR Bilateral   . TONSILLECTOMY         Home Medications    Prior to Admission medications   Medication Sig Start Date End Date Taking? Authorizing Provider  amLODipine (NORVASC) 5 MG tablet Take 1 tablet (5 mg total) by  mouth daily. 02/28/17  Yes [name], MD  atorvastatin (LIPITOR) 10 MG tablet Take 1 tablet daily. Patient taking differently: Take 10 mg every evening by mouth.  12/14/16  Yes Burchette, Alinda Sierras, MD  cholecalciferol (VITAMIN D) 1000 UNITS tablet Take 1,000 Units by mouth every morning.   Yes [provider]  furosemide (LASIX) 20 MG tablet TAKE 1 TABLET BY MOUTH ONCE DAILY 01/31/17  Yes Burchette, Alinda Sierras, MD  Glucosamine-Chondroitin 500-400 MG CAPS Take 1 capsule by  mouth every morning.   Yes [provider]  levothyroxine (SYNTHROID, LEVOTHROID) 50 MCG tablet TAKE 1 TABLET BY MOUTH ONCE DAILY 01/10/17  Yes Burchette, Alinda Sierras, MD  metoprolol succinate (TOPROL-XL) 50 MG 24 hr tablet Take 1 tablet (50 mg total) by mouth daily. Take with or immediately following a meal. 11/16/16  Yes [name], MD  Multiple Vitamins-Minerals (CENTRUM SILVER PO) Take 1 tablet by mouth every morning.    Yes [provider]  warfarin (COUMADIN) 2 MG tablet Take as directed BY COUMADIN CLINIC Patient taking differently: Take 2-3 mg See admin instructions by mouth. Take 1 and 1/2 tablets on Monday and Friday then take 1 tablet all the other days 02/02/17  Yes [name], MD    Family History Family History  Problem Relation Age of Onset  . Emphysema Mother   . Diabetes Mother   . Kidney disease Father     Social History Social History   Tobacco Use  . Smoking status: Former Smoker    Packs/day: 1.00    Years: 12.00    Pack years: 12.00    Types: Cigarettes    Last attempt to quit: 05/04/1963    Years since quitting: 53.8  . Smokeless tobacco: Never Used  . Tobacco comment: quit cigars 82'   Substance Use Topics  . Alcohol use: Yes    Comment: 06/21/2013 "I was a moderate drinker at most; rarely have drink now"  . Drug use: No     Allergies   Patient has no known allergies.   Review of Systems Review of Systems  Constitutional: Positive for activity change.  Respiratory: Negative for chest tightness and shortness of breath.   Cardiovascular: Negative for chest pain.  Gastrointestinal: Positive for abdominal pain, diarrhea, nausea and vomiting. Negative for abdominal distention and blood in stool.  Genitourinary: Negative for dysuria.  Neurological: Negative for dizziness.  All other systems reviewed and are negative.    Physical Exam Updated Vital Signs BP (!) 159/74   Pulse 61   Temp 98 F (36.7 C) (Oral)   Resp 17   Ht  5\' 9"  (1.753 m)   Wt 73.5 kg (162 lb)   SpO2 96%   BMI 23.92 kg/m   Physical Exam  Constitutional: He is oriented to person, place, and time. He appears well-developed.  HENT:  Head: Atraumatic.  Neck: Neck supple.  Cardiovascular: Normal rate.  Pulmonary/Chest: Effort normal.  Abdominal: Soft. Normal appearance and bowel sounds are normal. He exhibits no distension. There is no splenomegaly or hepatomegaly. There is tenderness in the epigastric area. There is no rigidity, no rebound, no guarding and negative Murphy's sign.  Neurological: He is alert and oriented to person, place, and time.  Skin: Skin is warm.  Nursing note and vitals reviewed.    ED Treatments / Results  Labs (all labs ordered are listed, but only abnormal results are displayed) Labs Reviewed  COMPREHENSIVE METABOLIC PANEL - Abnormal; Notable for the following  components:      Result Value   Chloride 100 (*)    Glucose, Bld 144 (*)    BUN 24 (*)    ALT 11 (*)    Total Bilirubin 1.3 (*)    GFR calc non Af Amer 52 (*)    GFR calc Af Amer 60 (*)    All other components within normal limits  URINALYSIS, ROUTINE W REFLEX MICROSCOPIC - Abnormal; Notable for the following components:   Color, Urine AMBER (*)    APPearance HAZY (*)    Ketones, ur 20 (*)    Protein, ur 30 (*)    All other components within normal limits  CBC WITH DIFFERENTIAL/PLATELET  LIPASE, BLOOD    EKG  EKG Interpretation None       Radiology No results found.  Procedures Procedures (including critical care time)  Medications Ordered in ED Medications  gi cocktail (Maalox,Lidocaine,Donnatal) (30 mLs Oral Given 03/07/17 0120)  ondansetron (ZOFRAN) injection 4 mg (4 mg Intravenous Given 03/07/17 0121)  lactated ringers bolus 1,000 mL (1,000 mLs Intravenous New Bag/Given 03/07/17 0121)     Initial Impression / Assessment and Plan / ED Course  I have reviewed the triage vital signs and the nursing notes.  Pertinent labs &  imaging results that were available during my care of the patient were reviewed by me and considered in my medical decision making (see chart for details).  Clinical Course as of Mar 07 330  Mon Mar 07, 2017  0327 Results from the ER workup discussed with the patient face to face and all questions answered to the best of my ability.  Patient reassessed for the second time.  He now reports that his abdominal pain is completely resolved.  Lab results reviewed with the patient and the only abnormality we had was mild ketonuria.  Patient has already received IV fluids, and he has passed oral challenge.  I discussed with the patient and his son my concerns for severe pathology given the age and some of the risk factors, however given patient's symptoms have improved dramatically and his exam never had any concerning findings -we discussed the option of approaching the symptoms conservatively, with a wait and watch approach and strict ER return precautions.  Patient and the son are both comfortable with that plan over getting a CT scan of the abdomen in the emergency room at this time.  Patient will be discharged once the IV fluid bolus is completed.  Strict return precautions have been discussed verbally and will be typed up.  [AN]    Clinical Course User Index [AN] Varney Biles, MD    Patient comes in with chief complaint of abdominal pain, nausea with emesis and diarrhea.  Patient at one point had moderately severe abdominal pain, however on my exam there is no peritoneal findings and patient reports that his abdominal pain is significantly improved.  Medical history is significant for advanced heart failure with a EF of 20% and paroxysmal A. Fib, therefore differential diagnosis included mesenteric ischemia, in addition to gastroenteritis, PUD.  Ileus or small bowel obstruction is less likely.   Given that patient reported significant improvement in abdominal pain, no peritoneal signs at all on  the abdominal exam, and other possible etiologies we will start with basic labs and oral challenge.  If patient's lab workup shows significant abnormalities or his pain returns we will consider CAT scan of the abdomen.  Final Clinical Impressions(s) / ED Diagnoses   Final diagnoses:  Nausea vomiting and diarrhea  Epigastric abdominal pain    ED Discharge Orders    None       Varney Biles, MD 03/07/17 [phone]

## 2017-03-13 ENCOUNTER — Other Ambulatory Visit: Payer: Self-pay | Admitting: Family Medicine

## 2017-03-22 ENCOUNTER — Ambulatory Visit (INDEPENDENT_AMBULATORY_CARE_PROVIDER_SITE_OTHER): Payer: Medicare Other | Admitting: *Deleted

## 2017-03-22 DIAGNOSIS — Z5181 Encounter for therapeutic drug level monitoring: Secondary | ICD-10-CM | POA: Diagnosis not present

## 2017-03-22 DIAGNOSIS — I4892 Unspecified atrial flutter: Secondary | ICD-10-CM

## 2017-03-22 LAB — POCT INR: INR: 2.6

## 2017-03-22 NOTE — Patient Instructions (Signed)
Continue taking Coumadin 2 tablets everyday except 1.5 tablets on Mondays and Fridays. Recheck INR in 4 weeks. Call with any changes or new medications [phone]

## 2017-04-03 ENCOUNTER — Other Ambulatory Visit: Payer: Self-pay | Admitting: Family Medicine

## 2017-04-21 ENCOUNTER — Ambulatory Visit (INDEPENDENT_AMBULATORY_CARE_PROVIDER_SITE_OTHER): Payer: Medicare Other | Admitting: *Deleted

## 2017-04-21 DIAGNOSIS — I4891 Unspecified atrial fibrillation: Secondary | ICD-10-CM

## 2017-04-21 DIAGNOSIS — I484 Atypical atrial flutter: Secondary | ICD-10-CM

## 2017-04-21 DIAGNOSIS — Z5181 Encounter for therapeutic drug level monitoring: Secondary | ICD-10-CM | POA: Diagnosis not present

## 2017-04-21 DIAGNOSIS — I4892 Unspecified atrial flutter: Secondary | ICD-10-CM | POA: Diagnosis not present

## 2017-04-21 DIAGNOSIS — I480 Paroxysmal atrial fibrillation: Secondary | ICD-10-CM | POA: Diagnosis not present

## 2017-04-21 LAB — POCT INR: INR: 2.1

## 2017-04-21 NOTE — Patient Instructions (Signed)
Description   Continue taking Coumadin 2 tablets everyday except 1.5 tablets on Mondays and Fridays. Recheck INR in 4 weeks. Call with any changes or new medications [phone]     

## 2017-04-22 ENCOUNTER — Encounter: Payer: Self-pay | Admitting: Family Medicine

## 2017-04-22 ENCOUNTER — Ambulatory Visit (INDEPENDENT_AMBULATORY_CARE_PROVIDER_SITE_OTHER): Payer: Medicare Other | Admitting: Family Medicine

## 2017-04-22 VITALS — BP 110/80 | HR 74 | Temp 97.9°F | Wt 169.8 lb

## 2017-04-22 DIAGNOSIS — N183 Chronic kidney disease, stage 3 unspecified: Secondary | ICD-10-CM

## 2017-04-22 DIAGNOSIS — I4891 Unspecified atrial fibrillation: Secondary | ICD-10-CM | POA: Diagnosis not present

## 2017-04-22 DIAGNOSIS — E039 Hypothyroidism, unspecified: Secondary | ICD-10-CM

## 2017-04-22 DIAGNOSIS — I10 Essential (primary) hypertension: Secondary | ICD-10-CM

## 2017-04-22 NOTE — Patient Instructions (Addendum)
Fall Prevention in the Home Falls can cause injuries and can affect people from all age groups. There are many simple things that you can do to make your home safe and to help prevent falls. What can I do on the outside of my home?  Regularly repair the edges of walkways and driveways and fix any cracks.  Remove high doorway thresholds.  Trim any shrubbery on the main path into your home.  Use bright outdoor lighting.  Clear walkways of debris and clutter, including tools and rocks.  Regularly check that handrails are securely fastened and in good repair. Both sides of any steps should have handrails.  Install guardrails along the edges of any raised decks or porches.  Have leaves, snow, and ice cleared regularly.  Use sand or salt on walkways during winter months.  In the garage, clean up any spills right away, including grease or oil spills. What can I do in the bathroom?  Use night lights.  Install grab bars by the toilet and in the tub and shower. Do not use towel bars as grab bars.  Use non-skid mats or decals on the floor of the tub or shower.  If you need to sit down while you are in the shower, use a plastic, non-slip stool.  Keep the floor dry. Immediately clean up any water that spills on the floor.  Remove soap buildup in the tub or shower on a regular basis.  Attach bath mats securely with double-sided non-slip rug tape.  Remove throw rugs and other tripping hazards from the floor. What can I do in the bedroom?  Use night lights.  Make sure that a bedside light is easy to reach.  Do not use oversized bedding that drapes onto the floor.  Have a firm chair that has side arms to use for getting dressed.  Remove throw rugs and other tripping hazards from the floor. What can I do in the kitchen?  Clean up any spills right away.  Avoid walking on wet floors.  Place frequently used items in easy-to-reach places.  If you need to reach for something above  you, use a sturdy step stool that has a grab bar.  Keep electrical cables out of the way.  Do not use floor polish or wax that makes floors slippery. If you have to use wax, make sure that it is non-skid floor wax.  Remove throw rugs and other tripping hazards from the floor. What can I do in the stairways?  Do not leave any items on the stairs.  Make sure that there are handrails on both sides of the stairs. Fix handrails that are broken or loose. Make sure that handrails are as long as the stairways.  Check any carpeting to make sure that it is firmly attached to the stairs. Fix any carpet that is loose or worn.  Avoid having throw rugs at the top or bottom of stairways, or secure the rugs with carpet tape to prevent them from moving.  Make sure that you have a light switch at the top of the stairs and the bottom of the stairs. If you do not have them, have them installed. What are some other fall prevention tips?  Wear closed-toe shoes that fit well and support your feet. Wear shoes that have rubber soles or low heels.  When you use a stepladder, make sure that it is completely opened and that the sides are firmly locked. Have someone hold the ladder while you are using   it. Do not climb a closed stepladder.  Add color or contrast paint or tape to grab bars and handrails in your home. Place contrasting color strips on the first and last steps.  Use mobility aids as needed, such as canes, walkers, scooters, and crutches.  Turn on lights if it is dark. Replace any light bulbs that burn out.  Set up furniture so that there are clear paths. Keep the furniture in the same spot.  Fix any uneven floor surfaces.  Choose a carpet design that does not hide the edge of steps of a stairway.  Be aware of any and all pets.  Review your medicines with your healthcare provider. Some medicines can cause dizziness or changes in blood pressure, which increase your risk of falling. Talk with  your health care provider about other ways that you can decrease your risk of falls. This may include working with a physical therapist or trainer to improve your strength, balance, and endurance. This information is not intended to replace advice given to you by your health care provider. Make sure you discuss any questions you have with your health care provider. Document Released: 04/09/2002 Document Revised: 09/16/2015 Document Reviewed: 05/24/2014 Elsevier Interactive Patient Education  2018 [place] on coverage for new shingles vaccine (Shingrix) if interested.

## 2017-04-22 NOTE — Progress Notes (Signed)
Subjective:     Patient ID: [name], male   DOB: [birth year], 83 y.o.   MRN: [id number]  HPI Patient seen for medical follow-up. He has hypertension and brings in a log of home readings of these have been consistently well controlled over the past month. Remains on amlodipine, furosemide, and metoprolol. No dizziness. No headaches. No chest pains.  History of BPH. He has some urine urgency and still gets up about 3 times at night to urinate. Has been followed by urology. Remains on Flomax. His urine stream is fairly strong.  Atrial fibrillation on metoprolol and Coumadin. He is getting pro times checked regularly. No recent bleeding complications.  History of bilateral leg edema which is currently stable. No orthopnea. He has hypothyroidism on replacement with levothyroxin 50 g daily. Last TSH February 2018  Past Medical History:  Diagnosis Date  . AF (atrial fibrillation) [facility])    a. s/p RFCA/PVI @ [facility] clinic 2007.  . Arthritis    "joints" (06/21/2013)  . Atrial flutter ([place])    a. 06/2013 s/p TEE/DCCV  . Chronic systolic CHF (congestive heart failure) ([place])    a. 06/2013 Echo: EF 20%, diff HK, mild LVH.  Marland Kitchen History of cardiovascular stress test    Lexiscan Myoview 8/16:  EF 59%, attenuation artifact, no ischemia; Low Risk  . Hyperlipidemia   . HYPERTENSION, BENIGN 08/21/2009   Qualifier: Diagnosis of  By: Caryl Comes, MD, Remus Blake  Medications(s)  enalapril   . Hypothyroidism   . Neoplasm of uncertain behavior    SKIN  . Polymyalgia rheumatica ([place]) 01/13/2007   Qualifier: History of  By: Danny Lawless CMA, [place]      Past Surgical History:  Procedure Laterality Date  . ATRIAL FIBRILLATION ABLATION  2007  . CARDIOVERSION N/A 06/22/2013   Procedure: CARDIOVERSION;  Surgeon: Sueanne Margarita, MD;  [facility] ENDOSCOPY;  Service: Cardiovascular;  Laterality: N/A;  15:17 Lido 20mg ,IV , Propofol 30 mg, IV synched cardioversion @ 150 joules by Dr. Lajoyce Lauber from a flutter to  sinis brady which is baseline for this pt, reports 45-50 reg HR verified by Dr. Ashok Norris  . CARDIOVERSION N/A 11/21/2014   Procedure: CARDIOVERSION;  Surgeon: Fay Records, MD;  Location: [facility] ENDOSCOPY;  Service: Cardiovascular;  Laterality: N/A;  . CARDIOVERSION N/A 07/14/2015   Procedure: CARDIOVERSION;  Surgeon: Sueanne Margarita, MD;  Location: [place];  Service: Cardiovascular;  Laterality: N/A;  . CATARACT EXTRACTION, BILATERAL Bilateral Summer 2009  . CHOLECYSTECTOMY    . EP IMPLANTABLE DEVICE N/A 11/10/2015   Procedure: Pacemaker Implant;  Surgeon: [name], MD;  Location: [place] CV LAB;  Service: Cardiovascular;  Laterality: N/A;  . HAND SURGERY Right    "broke bone; grafted area w/bone from somewhere else"  . HEMORRHOID SURGERY    . INGUINAL HERNIA REPAIR Bilateral   . TEE WITHOUT CARDIOVERSION N/A 06/22/2013   Procedure: TRANSESOPHAGEAL ECHOCARDIOGRAM (TEE);  Surgeon: Sueanne Margarita, MD;  Location: [facility] ENDOSCOPY;  Service: Cardiovascular;  Laterality: N/A;  . TEE WITHOUT CARDIOVERSION N/A 07/14/2015   Procedure: TRANSESOPHAGEAL ECHOCARDIOGRAM (TEE);  Surgeon: Sueanne Margarita, MD;  Location: [facility] ENDOSCOPY;  Service: Cardiovascular;  Laterality: N/A;  . TONSILLECTOMY      reports that he quit smoking about 54 years ago. His smoking use included cigarettes. He has a 12.00 pack-year smoking history. he has never used smokeless tobacco. He reports that he drinks alcohol. He reports that he does not use drugs. family history includes Diabetes in  his mother; Emphysema in his mother; Kidney disease in his father. No Known Allergies   Review of Systems  Constitutional: Negative for activity change, appetite change, fatigue and fever.  Eyes: Negative for visual disturbance.  Respiratory: Negative for cough, chest tightness and shortness of breath.   Cardiovascular: Negative for chest pain, palpitations and leg swelling.  Gastrointestinal: Negative for abdominal pain and vomiting.   Endocrine: Negative for polydipsia and polyuria.  Genitourinary: Negative for dysuria, flank pain and hematuria.  Musculoskeletal: Negative for back pain and joint swelling.  Neurological: Negative for dizziness, syncope, weakness, light-headedness, numbness and headaches.       Objective:   Physical Exam  Constitutional: He is oriented to person, place, and time. He appears well-developed and well-nourished.  HENT:  Mouth/Throat: Oropharynx is clear and moist.  Neck: Neck supple.  Cardiovascular: Normal rate.  Pulmonary/Chest: Effort normal and breath sounds normal.  Musculoskeletal: He exhibits no edema.  Neurological: He is alert and oriented to person, place, and time.       Assessment:     #1 hypertension stable and at goal  #2 hypothyroidism on replacement  #3 atrial fibrillation rate controlled on metoprolol and on Coumadin  #4 BPH stable on Fosamax    Plan:     -Recheck TSH and basic metabolic panel at follow-up -Continue current blood pressure medications -Fall prevention discussed. He currently does not have any significant balance problems -Routine follow-up in 6 months and sooner as needed  [name] MD [facility]

## 2017-05-16 ENCOUNTER — Ambulatory Visit (INDEPENDENT_AMBULATORY_CARE_PROVIDER_SITE_OTHER): Payer: Medicare Other | Admitting: *Deleted

## 2017-05-16 DIAGNOSIS — I495 Sick sinus syndrome: Secondary | ICD-10-CM

## 2017-05-17 NOTE — Progress Notes (Signed)
Remote pacemaker transmission.   

## 2017-05-18 LAB — CUP PACEART REMOTE DEVICE CHECK
Battery Remaining Longevity: 98 mo
Battery Voltage: 3.01 V
Brady Statistic AP VS Percent: 93.06 %
Brady Statistic AS VP Percent: 0 %
Brady Statistic AS VS Percent: 6.93 %
Date Time Interrogation Session: 20190114152549
Implantable Lead Implant Date: 20170710
Implantable Lead Implant Date: 20170710
Implantable Lead Location: 753859
Implantable Lead Location: 753860
Lead Channel Impedance Value: 437 Ohm
Lead Channel Pacing Threshold Amplitude: 0.5 V
Lead Channel Pacing Threshold Amplitude: 0.625 V
Lead Channel Sensing Intrinsic Amplitude: 3.5 mV
Lead Channel Sensing Intrinsic Amplitude: 3.5 mV
Lead Channel Sensing Intrinsic Amplitude: 6 mV
Lead Channel Setting Pacing Amplitude: 3.5 V
MDC IDC MSMT LEADCHNL RA IMPEDANCE VALUE: 361 Ohm
MDC IDC MSMT LEADCHNL RA PACING THRESHOLD PULSEWIDTH: 0.4 ms
MDC IDC MSMT LEADCHNL RV IMPEDANCE VALUE: 285 Ohm
MDC IDC MSMT LEADCHNL RV IMPEDANCE VALUE: 418 Ohm
MDC IDC MSMT LEADCHNL RV PACING THRESHOLD PULSEWIDTH: 0.4 ms
MDC IDC MSMT LEADCHNL RV SENSING INTR AMPL: 6 mV
MDC IDC PG IMPLANT DT: 20170710
MDC IDC SET LEADCHNL RA PACING AMPLITUDE: 1.5 V
MDC IDC SET LEADCHNL RV PACING PULSEWIDTH: 1 ms
MDC IDC SET LEADCHNL RV SENSING SENSITIVITY: 0.9 mV
MDC IDC STAT BRADY AP VP PERCENT: 0.01 %
MDC IDC STAT BRADY RA PERCENT PACED: 93.04 %
MDC IDC STAT BRADY RV PERCENT PACED: 0.01 %

## 2017-05-19 ENCOUNTER — Ambulatory Visit (INDEPENDENT_AMBULATORY_CARE_PROVIDER_SITE_OTHER): Payer: Medicare Other | Admitting: *Deleted

## 2017-05-19 DIAGNOSIS — I4892 Unspecified atrial flutter: Secondary | ICD-10-CM

## 2017-05-19 DIAGNOSIS — I484 Atypical atrial flutter: Secondary | ICD-10-CM

## 2017-05-19 DIAGNOSIS — I4891 Unspecified atrial fibrillation: Secondary | ICD-10-CM

## 2017-05-19 DIAGNOSIS — Z5181 Encounter for therapeutic drug level monitoring: Secondary | ICD-10-CM | POA: Diagnosis not present

## 2017-05-19 DIAGNOSIS — I480 Paroxysmal atrial fibrillation: Secondary | ICD-10-CM | POA: Diagnosis not present

## 2017-05-19 LAB — POCT INR: INR: 1.9

## 2017-05-19 NOTE — Patient Instructions (Signed)
Description   Today Jan 17th take 2 and 1/2 tablets then continue taking Coumadin 2 tablets everyday except 1 and 1/2  tablets on Mondays and Fridays. Recheck INR in 4 weeks. Call with any changes or new medications [phone]

## 2017-05-20 ENCOUNTER — Encounter: Payer: Self-pay | Admitting: Cardiology

## 2017-06-20 ENCOUNTER — Other Ambulatory Visit: Payer: Self-pay

## 2017-06-20 MED ORDER — METOPROLOL SUCCINATE ER 50 MG PO TB24: 50.0000 mg | ORAL_TABLET | Freq: Every day | ORAL | 4 refills | Status: DC

## 2017-06-21 ENCOUNTER — Ambulatory Visit (INDEPENDENT_AMBULATORY_CARE_PROVIDER_SITE_OTHER): Payer: Medicare Other | Admitting: *Deleted

## 2017-06-21 DIAGNOSIS — I4892 Unspecified atrial flutter: Secondary | ICD-10-CM | POA: Diagnosis not present

## 2017-06-21 DIAGNOSIS — Z5181 Encounter for therapeutic drug level monitoring: Secondary | ICD-10-CM

## 2017-06-21 LAB — POCT INR: INR: 2.1

## 2017-06-21 NOTE — Patient Instructions (Signed)
Description   Continue taking Coumadin 2 tablets everyday except 1 and 1/2 tablets on Mondays and Fridays. Recheck INR in 4 weeks. Call with any changes or new medications [phone]

## 2017-06-24 ENCOUNTER — Other Ambulatory Visit: Payer: Self-pay | Admitting: Internal Medicine

## 2017-06-24 MED ORDER — METOPROLOL SUCCINATE ER 50 MG PO TB24: 50.0000 mg | ORAL_TABLET | Freq: Every day | ORAL | 4 refills | Status: DC

## 2017-06-24 NOTE — Telephone Encounter (Signed)
Pt's medication was resent to pt's pharmacy because it was on print the first time that medication was sent. Confirmation received.

## 2017-06-26 ENCOUNTER — Other Ambulatory Visit: Payer: Self-pay | Admitting: Family Medicine

## 2017-07-19 ENCOUNTER — Ambulatory Visit (INDEPENDENT_AMBULATORY_CARE_PROVIDER_SITE_OTHER): Payer: Medicare Other | Admitting: *Deleted

## 2017-07-19 DIAGNOSIS — I4892 Unspecified atrial flutter: Secondary | ICD-10-CM

## 2017-07-19 DIAGNOSIS — Z5181 Encounter for therapeutic drug level monitoring: Secondary | ICD-10-CM | POA: Diagnosis not present

## 2017-07-19 LAB — POCT INR: INR: 2.4

## 2017-07-19 NOTE — Patient Instructions (Signed)
Description   Continue taking Coumadin 2 tablets everyday except 1 and 1/2 tablets on Mondays and Fridays. Recheck INR in 5 weeks. Call with any changes or new medications [phone]

## 2017-08-04 DIAGNOSIS — H5211 Myopia, right eye: Secondary | ICD-10-CM | POA: Diagnosis not present

## 2017-08-04 DIAGNOSIS — H26492 Other secondary cataract, left eye: Secondary | ICD-10-CM | POA: Diagnosis not present

## 2017-08-04 DIAGNOSIS — Z961 Presence of intraocular lens: Secondary | ICD-10-CM | POA: Diagnosis not present

## 2017-08-04 DIAGNOSIS — H524 Presbyopia: Secondary | ICD-10-CM | POA: Diagnosis not present

## 2017-08-04 DIAGNOSIS — H52221 Regular astigmatism, right eye: Secondary | ICD-10-CM | POA: Diagnosis not present

## 2017-08-04 DIAGNOSIS — H53032 Strabismic amblyopia, left eye: Secondary | ICD-10-CM | POA: Diagnosis not present

## 2017-08-16 ENCOUNTER — Telehealth: Payer: Self-pay | Admitting: Cardiology

## 2017-08-16 ENCOUNTER — Ambulatory Visit (INDEPENDENT_AMBULATORY_CARE_PROVIDER_SITE_OTHER): Payer: Medicare Other | Admitting: *Deleted

## 2017-08-16 DIAGNOSIS — I495 Sick sinus syndrome: Secondary | ICD-10-CM | POA: Diagnosis not present

## 2017-08-16 NOTE — Telephone Encounter (Signed)
Spoke with pt and reminded pt of remote transmission that is due today. Pt verbalized understanding.   

## 2017-08-17 LAB — CUP PACEART REMOTE DEVICE CHECK
Battery Voltage: 3.01 V
Brady Statistic AP VP Percent: 0 %
Brady Statistic AS VP Percent: 0 %
Brady Statistic RV Percent Paced: 0.01 %
Implantable Lead Implant Date: 20170710
Implantable Lead Model: 5076
Implantable Pulse Generator Implant Date: 20170710
Lead Channel Impedance Value: 304 Ohm
Lead Channel Impedance Value: 361 Ohm
Lead Channel Impedance Value: 437 Ohm
Lead Channel Impedance Value: 437 Ohm
Lead Channel Pacing Threshold Amplitude: 0.5 V
Lead Channel Pacing Threshold Amplitude: 0.625 V
Lead Channel Pacing Threshold Pulse Width: 0.4 ms
Lead Channel Sensing Intrinsic Amplitude: 6 mV
Lead Channel Setting Pacing Amplitude: 1.5 V
Lead Channel Setting Pacing Pulse Width: 1 ms
Lead Channel Setting Sensing Sensitivity: 0.9 mV
MDC IDC LEAD IMPLANT DT: 20170710
MDC IDC LEAD LOCATION: 753859
MDC IDC LEAD LOCATION: 753860
MDC IDC MSMT BATTERY REMAINING LONGEVITY: 96 mo
MDC IDC MSMT LEADCHNL RA PACING THRESHOLD PULSEWIDTH: 0.4 ms
MDC IDC MSMT LEADCHNL RA SENSING INTR AMPL: 2.625 mV
MDC IDC MSMT LEADCHNL RA SENSING INTR AMPL: 2.625 mV
MDC IDC MSMT LEADCHNL RV SENSING INTR AMPL: 6 mV
MDC IDC SESS DTM: 20190416200324
MDC IDC SET LEADCHNL RV PACING AMPLITUDE: 3.5 V
MDC IDC STAT BRADY AP VS PERCENT: 94.05 %
MDC IDC STAT BRADY AS VS PERCENT: 5.94 %
MDC IDC STAT BRADY RA PERCENT PACED: 94.03 %

## 2017-08-17 NOTE — Progress Notes (Signed)
Remote pacemaker transmission.   

## 2017-08-18 ENCOUNTER — Encounter: Payer: Self-pay | Admitting: Cardiology

## 2017-08-19 ENCOUNTER — Other Ambulatory Visit: Payer: Self-pay | Admitting: Internal Medicine

## 2017-08-23 ENCOUNTER — Ambulatory Visit (INDEPENDENT_AMBULATORY_CARE_PROVIDER_SITE_OTHER): Payer: Medicare Other | Admitting: *Deleted

## 2017-08-23 DIAGNOSIS — Z5181 Encounter for therapeutic drug level monitoring: Secondary | ICD-10-CM

## 2017-08-23 DIAGNOSIS — I4892 Unspecified atrial flutter: Secondary | ICD-10-CM | POA: Diagnosis not present

## 2017-08-23 LAB — POCT INR: INR: 2.7

## 2017-08-23 NOTE — Patient Instructions (Signed)
Description   Continue taking Coumadin 2 tablets everyday except 1 and 1/2 tablets on Mondays and Fridays. Recheck INR in 6 weeks. Call with any changes or new medications [phone]

## 2017-08-29 ENCOUNTER — Ambulatory Visit (INDEPENDENT_AMBULATORY_CARE_PROVIDER_SITE_OTHER): Payer: Self-pay | Admitting: *Deleted

## 2017-08-29 DIAGNOSIS — I495 Sick sinus syndrome: Secondary | ICD-10-CM

## 2017-08-29 NOTE — Progress Notes (Signed)
Patient seen today per [place] with request to have his pacemaker rate "adjusted". Upon assessment patient presented with a log of BP,HR and number of sit ups he does daily. Patient reports several concerns including that he is not sleeping well, he feels fatigued some days, he is struggling to complete his 33 sit ups/day on a consistent basis and notices that he has frequent urination at night. He is concerned that maybe his heart rate is too high with activity -  His log showing hr's in the 80 pre and post workout. He states that he has come to this conclusion because prior to device implant his HR was in the 50s. We had a lengthy conversation including a discussion about the patients LRL - set at 20, the devices activity features and that I did not recommend we adjust these features. Patient was agreeable to my recommendations. I additionally recommended that he speak with his PCP regarding his night time urinary frequency - he was again agreeable. He did express that he felt maybe his BP medications were too strong as he noted a single diastolic reading at 49 and systolic readings ranging from 96-135. I offered to reach out to scheduling for options on moving his current appt with SK from 7/23 to earlier if possible. Patient was I agreement. No device interrogation or changes made today. Patient enrolled in remote monitoring.

## 2017-09-14 DIAGNOSIS — N5201 Erectile dysfunction due to arterial insufficiency: Secondary | ICD-10-CM | POA: Diagnosis not present

## 2017-09-14 DIAGNOSIS — N401 Enlarged prostate with lower urinary tract symptoms: Secondary | ICD-10-CM | POA: Diagnosis not present

## 2017-09-14 DIAGNOSIS — N3281 Overactive bladder: Secondary | ICD-10-CM | POA: Diagnosis not present

## 2017-09-15 ENCOUNTER — Encounter: Payer: Self-pay | Admitting: Internal Medicine

## 2017-09-15 ENCOUNTER — Ambulatory Visit (INDEPENDENT_AMBULATORY_CARE_PROVIDER_SITE_OTHER): Payer: Medicare Other | Admitting: Internal Medicine

## 2017-09-15 VITALS — BP 140/80 | HR 81 | Ht 69.0 in | Wt 167.0 lb

## 2017-09-15 DIAGNOSIS — Z950 Presence of cardiac pacemaker: Secondary | ICD-10-CM

## 2017-09-15 DIAGNOSIS — I4892 Unspecified atrial flutter: Secondary | ICD-10-CM | POA: Diagnosis not present

## 2017-09-15 DIAGNOSIS — I495 Sick sinus syndrome: Secondary | ICD-10-CM

## 2017-09-15 DIAGNOSIS — I428 Other cardiomyopathies: Secondary | ICD-10-CM | POA: Diagnosis not present

## 2017-09-15 NOTE — Progress Notes (Signed)
Patient Care Team: [name], MD as PCP - General (Family Medicine) [name], MD (Ophthalmology) [name], MD (Orthopedic Surgery) [name], MD (Cardiology) [name], MD (General Surgery)   HPI  [name] is a 84 y.o. male Seen in followup of atypical atrial flutter and sinus node dysfunction resulting in His bundle pacemaker implantation 6/17.  He has not noticed much improvement in exercise tolerance following pacemaker implantation.  Prior PVI ([facility]-2007)>> Rx amiodarone+warfarin >>>TEE-DCCV   More recently he has been on warfarin alone.  He had been seen in A. fib clinic on referral from the PA having been found in  atypical atrial flutter.  Cardioversion was arranged but in the interim he reverted to sinus rhythm.     No interval Afib The patient denies chest pain, shortness of breath, nocturnal dyspnea, orthopnea or peripheral edema.  There have been no palpitations, lightheadedness or syncope.     DATE TEST    2/15 Echo ER 20%   5/15 Echo    EF 55-60 %   8/16    myoview   EF 59 %  NO ISCHEMIA  3/17 TEE EF 40-45%    Date Cr Hgb  6/18 1.1    11/18  14.0     About 4 or 5 weeks ago he had a sudden change in his exercise capacity, this impacted his ability to do his calisthenics.  He noted no significant change in heart rate or blood pressure.  No arrhythmias were noted.  He saw Dr. Diona Fanti yesterday who discontinued his Flomax; he is somewhat improved.     Past Medical History:  Diagnosis Date  . AF (atrial fibrillation) [facility])    a. s/p RFCA/PVI @ [facility] clinic 2007.  . Arthritis    "joints" (06/21/2013)  . Atrial flutter ([place])    a. 06/2013 s/p TEE/DCCV  . Chronic systolic CHF (congestive heart failure) ([place])    a. 06/2013 Echo: EF 20%, diff HK, mild LVH.  Marland Kitchen History of cardiovascular stress test    Lexiscan Myoview 8/16:  EF 59%, attenuation artifact, no ischemia; Low Risk  . Hyperlipidemia   .  HYPERTENSION, BENIGN 08/21/2009   Qualifier: Diagnosis of  By: Caryl Comes, MD, Remus Blake  Medications(s)  enalapril   . Hypothyroidism   . Neoplasm of uncertain behavior    SKIN  . Polymyalgia rheumatica ([place]) 01/13/2007   Qualifier: History of  By: Danny Lawless CMA, [place]       Past Surgical History:  Procedure Laterality Date  . ATRIAL FIBRILLATION ABLATION  2007  . CARDIOVERSION N/A 06/22/2013   Procedure: CARDIOVERSION;  Surgeon: Sueanne Margarita, MD;  Location: [facility] ENDOSCOPY;  Service: Cardiovascular;  Laterality: N/A;  15:17 Lido 20mg ,IV , Propofol 30 mg, IV synched cardioversion @ 150 joules by Dr. Lajoyce Lauber from a flutter to sinis brady which is baseline for this pt, reports 45-50 reg HR verified by Dr. Ashok Norris  . CARDIOVERSION N/A 11/21/2014   Procedure: CARDIOVERSION;  Surgeon: Fay Records, MD;  Location: [facility] ENDOSCOPY;  Service: Cardiovascular;  Laterality: N/A;  . CARDIOVERSION N/A 07/14/2015   Procedure: CARDIOVERSION;  Surgeon: Sueanne Margarita, MD;  Location: [place];  Service: Cardiovascular;  Laterality: N/A;  . CATARACT EXTRACTION, BILATERAL Bilateral Summer 2009  . CHOLECYSTECTOMY    . EP IMPLANTABLE DEVICE N/A 11/10/2015   Procedure: Pacemaker Implant;  Surgeon: [name], MD;  Location: [place] CV LAB;  Service: Cardiovascular;  Laterality:  N/A;  . HAND SURGERY Right    "broke bone; grafted area w/bone from somewhere else"  . HEMORRHOID SURGERY    . INGUINAL HERNIA REPAIR Bilateral   . TEE WITHOUT CARDIOVERSION N/A 06/22/2013   Procedure: TRANSESOPHAGEAL ECHOCARDIOGRAM (TEE);  Surgeon: Sueanne Margarita, MD;  Location: [facility] ENDOSCOPY;  Service: Cardiovascular;  Laterality: N/A;  . TEE WITHOUT CARDIOVERSION N/A 07/14/2015   Procedure: TRANSESOPHAGEAL ECHOCARDIOGRAM (TEE);  Surgeon: Sueanne Margarita, MD;  Location: [facility] ENDOSCOPY;  Service: Cardiovascular;  Laterality: N/A;  . TONSILLECTOMY      Current Outpatient Medications  Medication Sig Dispense Refill  .  amLODipine (NORVASC) 5 MG tablet Take 1 tablet (5 mg total) by mouth daily. 30 tablet 9  . atorvastatin (LIPITOR) 10 MG tablet TAKE 1 TABLET BY MOUTH DAILY 90 tablet 1  . cholecalciferol (VITAMIN D) 1000 UNITS tablet Take 1,000 Units by mouth every morning.    . furosemide (LASIX) 20 MG tablet TAKE 1 TABLET BY MOUTH ONCE DAILY 90 tablet 1  . Glucosamine-Chondroitin 500-400 MG CAPS Take 1 capsule by mouth every morning.    Marland Kitchen levothyroxine (SYNTHROID, LEVOTHROID) 50 MCG tablet TAKE 1 TABLET BY MOUTH ONCE DAILY 90 tablet 0  . metoprolol succinate (TOPROL-XL) 50 MG 24 hr tablet Take 1 tablet (50 mg total) by mouth daily. Take with or immediately following a meal. 30 tablet 4  . Multiple Vitamins-Minerals (CENTRUM SILVER PO) Take 1 tablet by mouth every morning.     . ondansetron (ZOFRAN ODT) 4 MG disintegrating tablet Take 1 tablet (4 mg total) every 8 (eight) hours as needed by mouth for nausea or vomiting. 15 tablet 0  . warfarin (COUMADIN) 2 MG tablet TAKE AS DIRECTED BY COUMADIN CLINIC( THIS IS A 3 MONTH SUPPLY) 180 tablet 1   No current facility-administered medications for this visit.     No Known Allergies  Review of Systems negative except from HPI and PMH  Physical Exam BP 140/80   Pulse 81   Ht 5\' 9"  (1.753 m)   Wt 167 lb (75.8 kg)   SpO2 98%   BMI 24.66 kg/m  Well developed and nourished in no acute distress HENT normal Neck supple with JVP-flat Clear Regular rate and rhythm, no murmurs or gallops Abd-soft with active BS No Clubbing cyanosis edema Skin-warm and dry A & Oriented  Grossly normal sensory and motor function    ECG personally reviewed Apacing 81 -/09/38   Assessment &  Plan  Atrial Flutter   sinus bradycardia   NICM thought to be rate related  Resolved  Dyspnea on exertion  Pacemaker-Medtronic (DOI 7/17)  Hypertension.  No intercurrent atrial fibrillation or flutter  On Anticoagulation;  No bleeding issues   The mechanism of his fatigue is  not clear.  We will check a CBC and a TSH.  He is better off of his urological medications.  We will have him take his metoprolol at night and his amlodipine in the morning.

## 2017-09-15 NOTE — Patient Instructions (Signed)
Medication Instructions:  Your physician recommends that you continue on your current medications as directed. Please refer to the Current Medication list given to you today.  Labwork: You will have labs drawn today: CBC and TSH  Testing/Procedures: None ordered.  Follow-Up: Your physician wants you to follow-up in: One Year with Dr Caryl Comes. You will receive a reminder letter in the mail two months in advance. If you don't receive a letter, please call our office to schedule the follow-up appointment.  Remote monitoring is used to monitor your Pacemaker of ICD from home. This monitoring reduces the number of office visits required to check your device to one time per year. It allows [place] to keep an eye on the functioning of your device to ensure it is working properly. You are scheduled for a device check from home on 11/15/2017. You may send your transmission at any time that day. If you have a wireless device, the transmission will be sent automatically. After your physician reviews your transmission, you will receive a postcard with your next transmission date.    Any Other Special Instructions Will Be Listed Below (If Applicable).     If you need a refill on your cardiac medications before your next appointment, please call your pharmacy.

## 2017-09-16 LAB — CBC WITH DIFFERENTIAL/PLATELET
Basophils Absolute: 0 10*3/uL (ref 0.0–0.2)
Basos: 0 %
EOS (ABSOLUTE): 0.1 10*3/uL (ref 0.0–0.4)
Eos: 2 %
HEMATOCRIT: 40.5 % (ref 37.5–51.0)
Hemoglobin: 13 g/dL (ref 13.0–17.7)
Immature Grans (Abs): 0 10*3/uL (ref 0.0–0.1)
Immature Granulocytes: 0 %
LYMPHS ABS: 1 10*3/uL (ref 0.7–3.1)
Lymphs: 20 %
MCH: 29.6 pg (ref 26.6–33.0)
MCHC: 32.1 g/dL (ref 31.5–35.7)
MCV: 92 fL (ref 79–97)
MONOS ABS: 0.6 10*3/uL (ref 0.1–0.9)
Monocytes: 11 %
NEUTROS ABS: 3.5 10*3/uL (ref 1.4–7.0)
Neutrophils: 67 %
Platelets: 178 10*3/uL (ref 150–379)
RBC: 4.39 x10E6/uL (ref 4.14–5.80)
RDW: 13.8 % (ref 12.3–15.4)
WBC: 5.3 10*3/uL (ref 3.4–10.8)

## 2017-09-16 LAB — TSH: TSH: 4.22 u[IU]/mL (ref 0.450–4.500)

## 2017-09-23 ENCOUNTER — Other Ambulatory Visit: Payer: Self-pay | Admitting: Family Medicine

## 2017-10-04 ENCOUNTER — Ambulatory Visit (INDEPENDENT_AMBULATORY_CARE_PROVIDER_SITE_OTHER): Payer: Medicare Other

## 2017-10-04 DIAGNOSIS — Z5181 Encounter for therapeutic drug level monitoring: Secondary | ICD-10-CM

## 2017-10-04 DIAGNOSIS — I4892 Unspecified atrial flutter: Secondary | ICD-10-CM | POA: Diagnosis not present

## 2017-10-04 LAB — POCT INR: INR: 2 (ref 2.0–3.0)

## 2017-10-04 NOTE — Patient Instructions (Signed)
Description   Continue taking Coumadin 2 tablets everyday except 1.5 tablets on Mondays and Fridays. Recheck INR in 6 weeks. Call with any changes or new medications [phone]      

## 2017-11-09 DIAGNOSIS — N5201 Erectile dysfunction due to arterial insufficiency: Secondary | ICD-10-CM | POA: Diagnosis not present

## 2017-11-09 DIAGNOSIS — R350 Frequency of micturition: Secondary | ICD-10-CM | POA: Diagnosis not present

## 2017-11-09 DIAGNOSIS — N3281 Overactive bladder: Secondary | ICD-10-CM | POA: Diagnosis not present

## 2017-11-09 DIAGNOSIS — N401 Enlarged prostate with lower urinary tract symptoms: Secondary | ICD-10-CM | POA: Diagnosis not present

## 2017-11-14 ENCOUNTER — Telehealth: Payer: Self-pay | Admitting: Internal Medicine

## 2017-11-14 ENCOUNTER — Other Ambulatory Visit: Payer: Self-pay | Admitting: Family Medicine

## 2017-11-14 NOTE — Telephone Encounter (Signed)
New Message      [facility] Pre-operative Risk Assessment    Request for surgical clearance:  1. What type of surgery is being performed? Urolist procedure  2. When is this surgery scheduled? TBD  3. What type of clearance is required (medical clearance vs. Pharmacy clearance to hold med vs. Both)? Both  4. Are there any medications that need to be held prior to surgery and how long? Coumadin  5. Practice name and name of physician performing surgery? [facility] / Dr.Dahlstedt  6. What is your office phone number [phone] ext.    7.   What is your office fax number [phone]  8.   Anesthesia type (None, local, MAC, general) ? MAc   [name] 11/14/2017, 1:18 PM  _________________________________________________________________   (provider comments below)

## 2017-11-15 ENCOUNTER — Ambulatory Visit (INDEPENDENT_AMBULATORY_CARE_PROVIDER_SITE_OTHER): Payer: Medicare Other | Admitting: *Deleted

## 2017-11-15 DIAGNOSIS — I4892 Unspecified atrial flutter: Secondary | ICD-10-CM

## 2017-11-15 DIAGNOSIS — Z5181 Encounter for therapeutic drug level monitoring: Secondary | ICD-10-CM

## 2017-11-15 LAB — POCT INR: INR: 3 (ref 2.0–3.0)

## 2017-11-15 NOTE — Telephone Encounter (Signed)
Pt takes warfarin for afib/aflutter with CHADS2VASc score of 4 (age x2, HTN, CHF). Urolift procedure should be minimally invasive. Would see if [facility] is ok with pt holding warfarin 3-4 days prior to procedure. If they require longer, pt should hold no more than 5 days prior to procedure and resume in evening on procedure date. Please have pt contact Coumadin clinic once procedure date is set in case we need to move his next INR appointment.

## 2017-11-15 NOTE — Patient Instructions (Signed)
Description   Continue taking Coumadin 2 tablets everyday except 1.5 tablets on Mondays and Fridays. Recheck INR in 6 weeks. Call with any changes or new medications [phone]

## 2017-11-15 NOTE — Telephone Encounter (Signed)
   Primary Cardiologist: Dr Caryl Comes  Chart reviewed and patient interviewed over the phone today as part of pre-operative protocol coverage. Given past medical history and time since last visit, based on ACC/AHA guidelines, [name] would be at acceptable risk for the planned procedure without further cardiovascular testing.   Pharmacy recommends holding Coumadin no longer than 5 days pre op, (3-4 days if possible). Resume PM of surgery if possible.   I will route this recommendation to the requesting party via Epic fax function and remove from pre-op pool.  Please call with questions.  Kerin Ransom, PA-C 11/15/2017, 4:20 PM

## 2017-11-16 ENCOUNTER — Telehealth: Payer: Self-pay

## 2017-11-16 NOTE — Telephone Encounter (Signed)
   [facility] Pre-operative Risk Assessment    Request for surgical clearance:  1. What type of surgery is being performed? Urolift Insertion   2. When is this surgery scheduled? TBD   3. What type of clearance is required (medical clearance vs. Pharmacy clearance to hold med vs. Both)? Both  4. Are there any medications that need to be held prior to surgery and how long?Coumadin, 5 days   5. Practice name and name of physician performing surgery? [facility] Dahl[facility]t/ [facility] Specialist   6. What is your office phone [phone]    7.   What is your office fax [phone]  8.   Anesthesia type (None, local, MAC, general) ? General  Office needs last OV note and most recent PPM interrogation report.   [name] 11/16/2017, 1:44 PM  _________________________________________________________________   (provider comments below)

## 2017-11-16 NOTE — Telephone Encounter (Signed)
This was sent yesterday. I re sent it today and will close the encounter.  Kerin Ransom PA-C 11/16/2017 4:53 PM

## 2017-11-21 ENCOUNTER — Other Ambulatory Visit: Payer: Self-pay | Admitting: Internal Medicine

## 2017-11-22 ENCOUNTER — Encounter: Payer: Medicare Other | Admitting: Internal Medicine

## 2017-12-14 ENCOUNTER — Other Ambulatory Visit: Payer: Self-pay | Admitting: Family Medicine

## 2017-12-14 ENCOUNTER — Other Ambulatory Visit: Payer: Self-pay | Admitting: Internal Medicine

## 2017-12-19 DIAGNOSIS — N401 Enlarged prostate with lower urinary tract symptoms: Secondary | ICD-10-CM | POA: Diagnosis not present

## 2017-12-19 DIAGNOSIS — R350 Frequency of micturition: Secondary | ICD-10-CM | POA: Diagnosis not present

## 2017-12-30 ENCOUNTER — Ambulatory Visit (INDEPENDENT_AMBULATORY_CARE_PROVIDER_SITE_OTHER): Payer: Medicare Other | Admitting: Pharmacist

## 2017-12-30 DIAGNOSIS — I4892 Unspecified atrial flutter: Secondary | ICD-10-CM

## 2017-12-30 DIAGNOSIS — Z5181 Encounter for therapeutic drug level monitoring: Secondary | ICD-10-CM

## 2017-12-30 LAB — POCT INR: INR: 1.6 — AB (ref 2.0–3.0)

## 2017-12-30 NOTE — Patient Instructions (Signed)
Description   Today take 2 tablets, then continue taking Coumadin 2 tablets everyday except 1.5 tablets on Mondays and Fridays. Recheck INR in 2 weeks. Call with any changes or new medications [phone]

## 2018-01-12 ENCOUNTER — Telehealth: Payer: Self-pay | Admitting: Family Medicine

## 2018-01-12 NOTE — Telephone Encounter (Signed)
Copied from [place] [id number]. Topic: Referral - Request >> Jan 12, 2018  2:34 PM Lennox Solders wrote: Reason for CRM: pt would like [facility] phone# [phone] fax number [phone]. Pt referral for insurance. Pt has medicare. Pt has an appt on 01-25-18

## 2018-01-13 ENCOUNTER — Ambulatory Visit (INDEPENDENT_AMBULATORY_CARE_PROVIDER_SITE_OTHER): Payer: Medicare Other | Admitting: *Deleted

## 2018-01-13 DIAGNOSIS — Z5181 Encounter for therapeutic drug level monitoring: Secondary | ICD-10-CM | POA: Diagnosis not present

## 2018-01-13 DIAGNOSIS — I4892 Unspecified atrial flutter: Secondary | ICD-10-CM

## 2018-01-13 LAB — POCT INR: INR: 2 (ref 2.0–3.0)

## 2018-01-13 NOTE — Telephone Encounter (Signed)
OK 

## 2018-01-13 NOTE — Telephone Encounter (Signed)
written

## 2018-01-13 NOTE — Patient Instructions (Signed)
Description   Today take 2 tablet then continue taking Coumadin 2 tablets everyday except 1.5 tablets on Mondays and Fridays. Recheck INR in 3 weeks. Call with any changes or new medications [phone]

## 2018-01-13 NOTE — Telephone Encounter (Signed)
I called the number below and per [facility] their office needs a written order, not electronic faxed to them stating this is for a hearing evaluation with the diagnosis of hearing loss.  Message sent to Dr Elease Hashimoto for Rx.

## 2018-01-16 ENCOUNTER — Ambulatory Visit: Payer: Medicare Other | Admitting: *Deleted

## 2018-01-16 ENCOUNTER — Other Ambulatory Visit: Payer: Self-pay

## 2018-01-16 ENCOUNTER — Telehealth: Payer: Self-pay | Admitting: Cardiology

## 2018-01-16 NOTE — Telephone Encounter (Signed)
Written Rx faxed to [phone].

## 2018-01-16 NOTE — Telephone Encounter (Signed)
Confirmed remote transmission w/ pt wife.   

## 2018-01-17 ENCOUNTER — Encounter: Payer: Self-pay | Admitting: Cardiology

## 2018-01-17 ENCOUNTER — Ambulatory Visit (INDEPENDENT_AMBULATORY_CARE_PROVIDER_SITE_OTHER): Payer: Medicare Other | Admitting: *Deleted

## 2018-01-17 DIAGNOSIS — I495 Sick sinus syndrome: Secondary | ICD-10-CM | POA: Diagnosis not present

## 2018-01-18 NOTE — Telephone Encounter (Signed)
I looked for this fax on your desk and I did not see it. Do you have to update and fax again?

## 2018-01-18 NOTE — Telephone Encounter (Signed)
[name] with [facility] [place] calling and states that they received the referral that was faxed over. States that it was hard to read. Would like to know if it could be resent with the patient's name written out legibly, DOB of patient, and the NPI number of the referring doctor. Please advise. FAX#: [phone]

## 2018-01-18 NOTE — Progress Notes (Signed)
Remote pacemaker transmission.   

## 2018-01-20 NOTE — Telephone Encounter (Signed)
Found fax in box, refaxed with information requested.

## 2018-01-20 NOTE — Telephone Encounter (Signed)
I do not have the fax-was placed in your faxed folder.

## 2018-01-24 ENCOUNTER — Telehealth: Payer: Self-pay | Admitting: Family Medicine

## 2018-01-24 NOTE — Telephone Encounter (Signed)
Called [name] and let her know that the signature is Dr. Erick Blinks real signature and all of the information requested is on the fax and that we have received confirmation that the fax has gone through. I have sent this fax again and hopefully she will receive.

## 2018-01-24 NOTE — Telephone Encounter (Signed)
Copied from [place] [phone]. Topic: General - Other >> Jan 24, 2018  3:01 PM Janace Aris A wrote: Reason for CRM: Michelene Heady from [facility] has called and requested that we send the fax over again because she never received it , per the notes on 01/20/18.  She says please make sure the missing information is included on the fax and legible so it can be read, she says she couldn't read the initial fax.  [phone] (Fax) attn:  Michelene Heady   She also wanted to let us know Medicare does not accept stamped signatures from the doctor. Has to be hand signed.

## 2018-01-25 DIAGNOSIS — H903 Sensorineural hearing loss, bilateral: Secondary | ICD-10-CM | POA: Diagnosis not present

## 2018-02-02 ENCOUNTER — Ambulatory Visit (INDEPENDENT_AMBULATORY_CARE_PROVIDER_SITE_OTHER): Payer: Medicare Other | Admitting: Pharmacist

## 2018-02-02 DIAGNOSIS — Z5181 Encounter for therapeutic drug level monitoring: Secondary | ICD-10-CM

## 2018-02-02 DIAGNOSIS — I4892 Unspecified atrial flutter: Secondary | ICD-10-CM | POA: Diagnosis not present

## 2018-02-02 LAB — POCT INR: INR: 2.3 (ref 2.0–3.0)

## 2018-02-02 NOTE — Patient Instructions (Signed)
Description   Continue taking Coumadin 2 tablets everyday except 1.5 tablets on Mondays and Fridays. Recheck INR in 4 weeks. Call with any changes or new medications [phone]

## 2018-02-08 DIAGNOSIS — R350 Frequency of micturition: Secondary | ICD-10-CM | POA: Diagnosis not present

## 2018-02-08 DIAGNOSIS — N401 Enlarged prostate with lower urinary tract symptoms: Secondary | ICD-10-CM | POA: Diagnosis not present

## 2018-02-08 LAB — CUP PACEART REMOTE DEVICE CHECK
Battery Remaining Longevity: 92 mo
Battery Voltage: 3.01 V
Brady Statistic AS VP Percent: 0 %
Brady Statistic AS VS Percent: 4.5 %
Brady Statistic RV Percent Paced: 0 %
Implantable Lead Implant Date: 20170710
Implantable Lead Implant Date: 20170710
Implantable Pulse Generator Implant Date: 20170710
Lead Channel Impedance Value: 437 Ohm
Lead Channel Impedance Value: 456 Ohm
Lead Channel Pacing Threshold Amplitude: 0.5 V
Lead Channel Pacing Threshold Amplitude: 0.625 V
Lead Channel Pacing Threshold Pulse Width: 0.4 ms
Lead Channel Sensing Intrinsic Amplitude: 2.5 mV
Lead Channel Sensing Intrinsic Amplitude: 2.5 mV
Lead Channel Setting Pacing Pulse Width: 1 ms
Lead Channel Setting Sensing Sensitivity: 0.9 mV
MDC IDC LEAD LOCATION: 753859
MDC IDC LEAD LOCATION: 753860
MDC IDC MSMT LEADCHNL RA IMPEDANCE VALUE: 380 Ohm
MDC IDC MSMT LEADCHNL RA PACING THRESHOLD PULSEWIDTH: 0.4 ms
MDC IDC MSMT LEADCHNL RV IMPEDANCE VALUE: 285 Ohm
MDC IDC MSMT LEADCHNL RV SENSING INTR AMPL: 6.375 mV
MDC IDC MSMT LEADCHNL RV SENSING INTR AMPL: 6.375 mV
MDC IDC SESS DTM: 20190917183204
MDC IDC SET LEADCHNL RA PACING AMPLITUDE: 1.5 V
MDC IDC SET LEADCHNL RV PACING AMPLITUDE: 3.5 V
MDC IDC STAT BRADY AP VP PERCENT: 0 %
MDC IDC STAT BRADY AP VS PERCENT: 95.5 %
MDC IDC STAT BRADY RA PERCENT PACED: 95.5 %

## 2018-02-12 ENCOUNTER — Other Ambulatory Visit: Payer: Self-pay | Admitting: Family Medicine

## 2018-02-12 ENCOUNTER — Other Ambulatory Visit: Payer: Self-pay | Admitting: Internal Medicine

## 2018-02-13 ENCOUNTER — Ambulatory Visit: Payer: Medicare Other

## 2018-02-15 NOTE — Progress Notes (Signed)
Subjective:   [name] is a 84 y.o. male who presents for Medicare Annual/Subsequent preventive examination.  Reports health as Had a new procedure on prostate x 2 months ago  Uncomfortable procedure  Lost blood- still has to go frequently   Has 3 children    Diet; lost about 10lbs   Breakfast this am honey dew melon; V8 juice; fruit. Cereal  Lunch; soup and likes to make his own- mushroom base soup  Will make a sandwich;  [place] pastrami  Supper; grills meats incorporates fruits and vegetables;  Lipids reviewed and reducing cholesterol discussed  Referral to Diabetes and Nutritional center if appropriate   Fall risk: presented mobile; no  Mobility of Functional changes this year? Can't do as much  Exercise Used to do 31 pushups and 31 situp's and can't do them since he had the implant; trying to get back to where he was but he can't do them Was back up to 15 of each of the above Now back when out    Functional changes;  Quit doing yard work; hires this out now  [employer] Due  Topic Date Due  . INFLUENZA VACCINE  12/01/2017    Flu vaccine taken today  Cardiac Risk Factors include: advanced age (>56men, >16 women);dyslipidemia;family history of premature cardiovascular disease;male gender;hypertension     Objective:    Vitals: BP 124/80   Pulse 68   Ht 5\' 8"  (1.727 m)   Wt 164 lb (74.4 kg)   SpO2 97%   BMI 24.94 kg/m   Body mass index is 24.94 kg/m.  Advanced Directives 02/16/2018 03/06/2017 05/28/2016 11/10/2015 11/10/2015 10/02/2015 07/14/2015  Does Patient Have a Medical Advance Directive? Yes No Yes No No No Yes  Type of Advance Directive - - - - - - Press photographer;Living will  Does patient want to make changes to medical advance directive? - - - - - - -  Copy of [place] in Chart? - - - - - - Yes  Would patient like information on creating a medical advance directive? - - - No - patient declined information  No - patient declined information No - patient declined information -    Tobacco Social History   Tobacco Use  Smoking Status Former Smoker  . Packs/day: 1.00  . Years: 12.00  . Pack years: 12.00  . Types: Cigarettes  . Last attempt to quit: 05/04/1963  . Years since quitting: 54.8  Smokeless Tobacco Never Used  Tobacco Comment   quit cigars 50'      Counseling given: Yes Comment: quit cigars 84'    Clinical Intake:     Past Medical History:  Diagnosis Date  . AF (atrial fibrillation) [facility])    a. s/p RFCA/PVI @ [facility] clinic 2007.  . Arthritis    "joints" (06/21/2013)  . Atrial flutter ([place])    a. 06/2013 s/p TEE/DCCV  . Chronic systolic CHF (congestive heart failure) ([place])    a. 06/2013 Echo: EF 20%, diff HK, mild LVH.  Marland Kitchen History of cardiovascular stress test    Lexiscan Myoview 8/16:  EF 59%, attenuation artifact, no ischemia; Low Risk  . Hyperlipidemia   . HYPERTENSION, BENIGN 08/21/2009   Qualifier: Diagnosis of  By: Caryl Comes, MD, Remus Blake  Medications(s)  enalapril   . Hypothyroidism   . Neoplasm of uncertain behavior    SKIN  . Polymyalgia rheumatica ([place]) 01/13/2007   Qualifier: History of  By: Danny Lawless CMA, [place]  Past Surgical History:  Procedure Laterality Date  . ATRIAL FIBRILLATION ABLATION  2007  . CARDIOVERSION N/A 06/22/2013   Procedure: CARDIOVERSION;  Surgeon: Sueanne Margarita, MD;  Location: [facility] ENDOSCOPY;  Service: Cardiovascular;  Laterality: N/A;  15:17 Lido 20mg ,IV , Propofol 30 mg, IV synched cardioversion @ 150 joules by Dr. Lajoyce Lauber from a flutter to sinis brady which is baseline for this pt, reports 45-50 reg HR verified by Dr. Ashok Norris  . CARDIOVERSION N/A 11/21/2014   Procedure: CARDIOVERSION;  Surgeon: Fay Records, MD;  Location: [facility] ENDOSCOPY;  Service: Cardiovascular;  Laterality: N/A;  . CARDIOVERSION N/A 07/14/2015   Procedure: CARDIOVERSION;  Surgeon: Sueanne Margarita, MD;  Location: [place];  Service: Cardiovascular;   Laterality: N/A;  . CATARACT EXTRACTION, BILATERAL Bilateral Summer 2009  . CHOLECYSTECTOMY    . EP IMPLANTABLE DEVICE N/A 11/10/2015   Procedure: Pacemaker Implant;  Surgeon: [name], MD;  Location: [place] CV LAB;  Service: Cardiovascular;  Laterality: N/A;  . HAND SURGERY Right    "broke bone; grafted area w/bone from somewhere else"  . HEMORRHOID SURGERY    . INGUINAL HERNIA REPAIR Bilateral   . TEE WITHOUT CARDIOVERSION N/A 06/22/2013   Procedure: TRANSESOPHAGEAL ECHOCARDIOGRAM (TEE);  Surgeon: Sueanne Margarita, MD;  Location: [facility] ENDOSCOPY;  Service: Cardiovascular;  Laterality: N/A;  . TEE WITHOUT CARDIOVERSION N/A 07/14/2015   Procedure: TRANSESOPHAGEAL ECHOCARDIOGRAM (TEE);  Surgeon: Sueanne Margarita, MD;  Location: [facility] ENDOSCOPY;  Service: Cardiovascular;  Laterality: N/A;  . TONSILLECTOMY     Family History  Problem Relation Age of Onset  . Emphysema Mother   . Diabetes Mother   . Kidney disease Father    Social History   Socioeconomic History  . Marital status: Married    Spouse name: Goldie Dimmer  . Number of children: 3  . Years of education: 59  . Highest education level: Not on file  Occupational History  . Occupation: RETIRED Administrator, sports: RETIRED    Comment: Still active w/stocks  . Occupation: RETIRED    Employer: RETIRED  Social Needs  . Financial resource strain: Not on file  . Food insecurity:    Worry: Not on file    Inability: Not on file  . Transportation needs:    Medical: Not on file    Non-medical: Not on file  Tobacco Use  . Smoking status: Former Smoker    Packs/day: 1.00    Years: 12.00    Pack years: 12.00    Types: Cigarettes    Last attempt to quit: 05/04/1963    Years since quitting: 54.8  . Smokeless tobacco: Never Used  . Tobacco comment: quit cigars 71'   Substance and Sexual Activity  . Alcohol use: Yes    Comment: 06/21/2013 "I was a moderate drinker at most; rarely have drink now"  . Drug use: No  . Sexual  activity: Never    Partners: Female  Lifestyle  . Physical activity:    Days per week: Not on file    Minutes per session: Not on file  . Stress: Not on file  Relationships  . Social connections:    Talks on phone: Not on file    Gets together: Not on file    Attends religious service: Not on file    Active member of club or organization: Not on file    Attends meetings of clubs or organizations: Not on file    Relationship status: Not on file  Other Topics Concern  . 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 Married 1958. 2 -girls '59, '62; 1 son '64; 2 grandchildren. Retired Customer service manager. END of LIFE CARE: Yes - DNR, yes - for short term mechanical ventilation.has a living will, would not want any heroic/futile care.  Wife sustained fracture proximal humerus left - Sept '11    Outpatient Encounter Medications as of 02/16/2018  Medication Sig  . amLODipine (NORVASC) 5 MG tablet TAKE 1 TABLET BY MOUTH DAILY  . atorvastatin (LIPITOR) 10 MG tablet TAKE 1 TABLET BY MOUTH DAILY  . cholecalciferol (VITAMIN D) 1000 UNITS tablet Take 1,000 Units by mouth every morning.  . furosemide (LASIX) 20 MG tablet TAKE 1 TABLET BY MOUTH EVERY DAY  . Glucosamine-Chondroitin 500-400 MG CAPS Take 1 capsule by mouth every morning.  Marland Kitchen levothyroxine (SYNTHROID, LEVOTHROID) 50 MCG tablet TAKE 1 TABLET BY MOUTH EVERY DAY  . metoprolol succinate (TOPROL-XL) 50 MG 24 hr tablet TAKE 1 TABLET( 50 MG TOTAL) BY MOUTH DAILY. TAKE WITH OR IMMEDIATELY FOLLOWING A MEAL.  . Multiple Vitamins-Minerals (CENTRUM SILVER PO) Take 1 tablet by mouth every morning.   . ondansetron (ZOFRAN ODT) 4 MG disintegrating tablet Take 1 tablet (4 mg total) every 8 (eight) hours as needed by mouth for nausea or vomiting.  . warfarin (COUMADIN) 2 MG tablet TAKE AS DIRECTED BY COUMADIN CLINIC( THIS IS A 3 MONTH SUPPLY)   No facility-administered encounter medications on file as of  02/16/2018.     Activities of Daily Living In your present state of health, do you have any difficulty performing the following activities: 02/16/2018  Hearing? N  Vision? N  Difficulty concentrating or making decisions? N  Walking or climbing stairs? N  Dressing or bathing? N  Doing errands, shopping? N  Preparing Food and eating ? N  Using the Toilet? N  In the past six months, have you accidently leaked urine? Y  Comment had surgery   Do you have problems with loss of bowel control? N  Managing your Medications? N  Managing your Finances? N  Housekeeping or managing your Housekeeping? N  Some recent data might be hidden    Patient Care Team: [name], MD as PCP - General (Family Medicine) [name], MD (Ophthalmology) [name], MD (Orthopedic Surgery) [name], MD (Cardiology) [name], MD (General Surgery)   Assessment:   This is a routine wellness examination for Child.  Exercise Activities and Dietary recommendations Current Exercise Habits: Home exercise routine, Time (Minutes): 30(exercises every day ), Frequency (Times/Week): 5, Weekly Exercise (Minutes/Week): 150, Intensity: Moderate(does stay busy )  Goals    . Patient Stated     To have improved bladder control  Do the exercises every day        Fall Risk Fall Risk  02/16/2018 05/28/2016 04/08/2016 01/24/2015 01/23/2014  Falls in the past year? No No No No No  Comment - - Emmi Telephone Survey: data to providers prior to load - -     Depression Screen PHQ 2/9 Scores 02/16/2018 05/28/2016 01/24/2015 01/23/2014  PHQ - 2 Score 0 0 0 0  Exception Documentation - (No Data) - -    Cognitive Function MMSE - Mini Mental State Exam 02/16/2018 05/28/2016  Not completed: (No Data) (No Data)   Ad8 score reviewed for issues:  Issues making decisions:  Less interest in hobbies / activities:  Repeats questions, stories (family complaining):  Trouble using ordinary gadgets  (microwave, computer, phone):  Forgets the month or year:   Mismanaging finances:   Remembering appts:  Daily problems with thinking and/or memory: Ad8 score is=0          Immunization History  Administered Date(s) Administered  . Influenza Split 01/25/2012  . Influenza Whole 02/08/2008, 03/12/2009, 01/16/2010  . Influenza, High Dose Seasonal PF 01/24/2015, 03/04/2017, 02/16/2018  . Influenza,inj,Quad PF,6+ Mos 01/25/2013, 01/23/2014  . Influenza-Unspecified 01/02/2016  . Pneumococcal Conjugate-13 01/25/2013  . Pneumococcal Polysaccharide-23 05/28/2016  . Td 01/16/2010      Screening Tests Health Maintenance  Topic Date Due  . INFLUENZA VACCINE  12/01/2017  . TETANUS/TDAP  01/17/2020  . PNA vac Low Risk Adult  Completed       Plan:      PCP Notes   Health Maintenance Flu vaccine today   Abnormal Screens  None ( lost 10 lbs over the last year but just had Prostate surgery x 2 months ago)   Referrals  none  Patient concerns; None; very happy, mentally very bright, looks much younger than his stated age.   Nurse Concerns; As noted  Next PCP apt With Cardiology October and December        I have personally reviewed and noted the following in the patient's chart:   . Medical and social history . Use of alcohol, tobacco or illicit drugs  . Current medications and supplements . Functional ability and status . Nutritional status . Physical activity . Advanced directives . List of other physicians . Hospitalizations, surgeries, and ER visits in previous 12 months . Vitals . Screenings to include cognitive, depression, and falls . Referrals and appointments  In addition, I have reviewed and discussed with patient certain preventive protocols, quality metrics, and best practice recommendations. A written personalized care plan for preventive services as well as general preventive health recommendations were provided to patient.      Wynetta Fines, RN  02/16/2018

## 2018-02-16 ENCOUNTER — Ambulatory Visit (INDEPENDENT_AMBULATORY_CARE_PROVIDER_SITE_OTHER): Payer: Medicare Other

## 2018-02-16 DIAGNOSIS — Z0000 Encounter for general adult medical examination without abnormal findings: Secondary | ICD-10-CM

## 2018-02-16 DIAGNOSIS — Z23 Encounter for immunization: Secondary | ICD-10-CM | POA: Diagnosis not present

## 2018-02-16 NOTE — Patient Instructions (Addendum)
[name] , Thank you for taking time to come for your Medicare Wellness Visit. I appreciate your ongoing commitment to your health goals. Please review the following plan we discussed and let me know if I can assist you in the future.   Shingrix is a vaccine for the prevention of Shingles in Adults 50 and older.  If you are on Medicare, the shingrix is covered under your Part D plan, so you will take both of the vaccines in the series at your pharmacy. Please check with your benefits regarding applicable copays or out of pocket expenses.  The Shingrix is given in 2 vaccines approx 8 weeks apart. You must receive the 2nd dose prior to 6 months from receipt of the first. Please have the pharmacist print out you Immunization  dates for our office records     Veterans want to access their VA benefits can call [facility]  [address] [place], [place] [address]  Phone: [phone] Or [phone] Fax: [phone]  OR   They can to to the [facility] office and call [phone] and If the prompt starts, hit 0 and then ask for eligibility. They will assist you to sign up for medical benefits.     These are the goals we discussed: Goals   None     This is a list of the screening recommended for you and due dates:  Health Maintenance  Topic Date Due  . Flu Shot  12/01/2017  . Tetanus Vaccine  01/17/2020  . Pneumonia vaccines  Completed      Fall Prevention in the Home Falls can cause injuries. They can happen to people of all ages. There are many things you can do to make your home safe and to help prevent falls. What can I do on the outside of my home?  Regularly fix the edges of walkways and driveways and fix any cracks.  Remove anything that might make you trip as you walk through a door, such as a raised step or threshold.  Trim any bushes or trees on the path to your home.  Use bright outdoor lighting.  Clear any walking paths  of anything that might make someone trip, such as rocks or tools.  Regularly check to see if handrails are loose or broken. Make sure that both sides of any steps have handrails.  Any raised decks and porches should have guardrails on the edges.  Have any leaves, snow, or ice cleared regularly.  Use sand or salt on walking paths during winter.  Clean up any spills in your garage right away. This includes oil or grease spills. What can I do in the bathroom?  Use night lights.  Install grab bars by the toilet and in the tub and shower. Do not use towel bars as grab bars.  Use non-skid mats or decals in the tub or shower.  If you need to sit down in the shower, use a plastic, non-slip stool.  Keep the floor dry. Clean up any water that spills on the floor as soon as it happens.  Remove soap buildup in the tub or shower regularly.  Attach bath mats securely with double-sided non-slip rug tape.  Do not have throw rugs and other things on the floor that can make you trip. What can I do in the bedroom?  Use night lights.  Make sure that you have a light by your bed that is easy to reach.  Do not use  any sheets or blankets that are too big for your bed. They should not hang down onto the floor.  Have a firm chair that has side arms. You can use this for support while you get dressed.  Do not have throw rugs and other things on the floor that can make you trip. What can I do in the kitchen?  Clean up any spills right away.  Avoid walking on wet floors.  Keep items that you use a lot in easy-to-reach places.  If you need to reach something above you, use a strong step stool that has a grab bar.  Keep electrical cords out of the way.  Do not use floor polish or wax that makes floors slippery. If you must use wax, use non-skid floor wax.  Do not have throw rugs and other things on the floor that can make you trip. What can I do with my stairs?  Do not leave any items on  the stairs.  Make sure that there are handrails on both sides of the stairs and use them. Fix handrails that are broken or loose. Make sure that handrails are as long as the stairways.  Check any carpeting to make sure that it is firmly attached to the stairs. Fix any carpet that is loose or worn.  Avoid having throw rugs at the top or bottom of the stairs. If you do have throw rugs, attach them to the floor with carpet tape.  Make sure that you have a light switch at the top of the stairs and the bottom of the stairs. If you do not have them, ask someone to add them for you. What else can I do to help prevent falls?  Wear shoes that: ? Do not have high heels. ? Have rubber bottoms. ? Are comfortable and fit you well. ? Are closed at the toe. Do not wear sandals.  If you use a stepladder: ? Make sure that it is fully opened. Do not climb a closed stepladder. ? Make sure that both sides of the stepladder are locked into place. ? Ask someone to hold it for you, if possible.  Clearly mark and make sure that you can see: ? Any grab bars or handrails. ? First and last steps. ? Where the edge of each step is.  Use tools that help you move around (mobility aids) if they are needed. These include: ? Canes. ? Walkers. ? Scooters. ? Crutches.  Turn on the lights when you go into a dark area. Replace any light bulbs as soon as they burn out.  Set up your furniture so you have a clear path. Avoid moving your furniture around.  If any of your floors are uneven, fix them.  If there are any pets around you, be aware of where they are.  Review your medicines with your doctor. Some medicines can make you feel dizzy. This can increase your chance of falling. Ask your doctor what other things that you can do to help prevent falls. This information is not intended to replace advice given to you by your health care provider. Make sure you discuss any questions you have with your health care  provider. Document Released: 02/13/2009 Document Revised: 09/25/2015 Document Reviewed: 05/24/2014 Elsevier Interactive Patient Education  2018 [place] Maintenance, Male A healthy lifestyle and preventive care is important for your health and wellness. Ask your health care provider about what schedule of regular examinations is right for you. What should  I know about weight and diet? Eat a Healthy Diet  Eat plenty of vegetables, fruits, whole grains, low-fat dairy products, and lean protein.  Do not eat a lot of foods high in solid fats, added sugars, or salt.  Maintain a Healthy Weight Regular exercise can help you achieve or maintain a healthy weight. You should:  Do at least 150 minutes of exercise each week. The exercise should increase your heart rate and make you sweat (moderate-intensity exercise).  Do strength-training exercises at least twice a week.  Watch Your Levels of Cholesterol and Blood Lipids  Have your blood tested for lipids and cholesterol every 5 years starting at 82 years of age. If you are at high risk for heart disease, you should start having your blood tested when you are 82 years old. You may need to have your cholesterol levels checked more often if: ? Your lipid or cholesterol levels are high. ? You are older than 82 years of age. ? You are at high risk for heart disease.  What should I know about cancer screening? Many types of cancers can be detected early and may often be prevented. Lung Cancer  You should be screened every year for lung cancer if: ? You are a current smoker who has smoked for at least 30 years. ? You are a former smoker who has quit within the past 15 years.  Talk to your health care provider about your screening options, when you should start screening, and how often you should be screened.  Colorectal Cancer  Routine colorectal cancer screening usually begins at 82 years of age and should be repeated every  5-10 years until you are 82 years old. You may need to be screened more often if early forms of precancerous polyps or small growths are found. Your health care provider may recommend screening at an earlier age if you have risk factors for colon cancer.  Your health care provider may recommend using home test kits to check for hidden blood in the stool.  A small camera at the end of a tube can be used to examine your colon (sigmoidoscopy or colonoscopy). This checks for the earliest forms of colorectal cancer.  Prostate and Testicular Cancer  Depending on your age and overall health, your health care provider may do certain tests to screen for prostate and testicular cancer.  Talk to your health care provider about any symptoms or concerns you have about testicular or prostate cancer.  Skin Cancer  Check your skin from head to toe regularly.  Tell your health care provider about any new moles or changes in moles, especially if: ? There is a change in a mole's size, shape, or color. ? You have a mole that is larger than a pencil eraser.  Always use sunscreen. Apply sunscreen liberally and repeat throughout the day.  Protect yourself by wearing long sleeves, pants, a wide-brimmed hat, and sunglasses when outside.  What should I know about heart disease, diabetes, and high blood pressure?  If you are 70-75 years of age, have your blood pressure checked every 3-5 years. If you are 36 years of age or older, have your blood pressure checked every year. You should have your blood pressure measured twice-once when you are at a hospital or clinic, and once when you are not at a hospital or clinic. Record the average of the two measurements. To check your blood pressure when you are not at a hospital or clinic, you can use: ?  An automated blood pressure machine at a pharmacy. ? A home blood pressure monitor.  Talk to your health care provider about your target blood pressure.  If you are  between 91-56 years old, ask your health care provider if you should take aspirin to prevent heart disease.  Have regular diabetes screenings by checking your fasting blood sugar level. ? If you are at a normal weight and have a low risk for diabetes, have this test once every three years after the age of 18. ? If you are overweight and have a high risk for diabetes, consider being tested at a younger age or more often.  A one-time screening for abdominal aortic aneurysm (AAA) by ultrasound is recommended for men aged 45-75 years who are current or former smokers. What should I know about preventing infection? Hepatitis B If you have a higher risk for hepatitis B, you should be screened for this virus. Talk with your health care provider to find out if you are at risk for hepatitis B infection. Hepatitis C Blood testing is recommended for:  Everyone born from [birth year] through 1963.  Anyone with known risk factors for hepatitis C.  Sexually Transmitted Diseases (STDs)  You should be screened each year for STDs including gonorrhea and chlamydia if: ? You are sexually active and are younger than 82 years of age. ? You are older than 82 years of age and your health care provider tells you that you are at risk for this type of infection. ? Your sexual activity has changed since you were last screened and you are at an increased risk for chlamydia or gonorrhea. Ask your health care provider if you are at risk.  Talk with your health care provider about whether you are at high risk of being infected with HIV. Your health care provider may recommend a prescription medicine to help prevent HIV infection.  What else can I do?  Schedule regular health, dental, and eye exams.  Stay current with your vaccines (immunizations).  Do not use any tobacco products, such as cigarettes, chewing tobacco, and e-cigarettes. If you need help quitting, ask your health care provider.  Limit alcohol intake to no  more than 2 drinks per day. One drink equals 12 ounces of beer, 5 ounces of wine, or 1 ounces of hard liquor.  Do not use street drugs.  Do not share needles.  Ask your health care provider for help if you need support or information about quitting drugs.  Tell your health care provider if you often feel depressed.  Tell your health care provider if you have ever been abused or do not feel safe at home. This information is not intended to replace advice given to you by your health care provider. Make sure you discuss any questions you have with your health care provider. Document Released: 10/16/2007 Document Revised: 12/17/2015 Document Reviewed: 01/21/2015 Elsevier Interactive Patient Education  [employer].

## 2018-02-18 NOTE — Progress Notes (Signed)
Gabriel Eckmann R Morine Kohlman, DO  

## 2018-03-02 ENCOUNTER — Ambulatory Visit (INDEPENDENT_AMBULATORY_CARE_PROVIDER_SITE_OTHER): Payer: Medicare Other | Admitting: *Deleted

## 2018-03-02 DIAGNOSIS — Z5181 Encounter for therapeutic drug level monitoring: Secondary | ICD-10-CM

## 2018-03-02 DIAGNOSIS — I4892 Unspecified atrial flutter: Secondary | ICD-10-CM

## 2018-03-02 LAB — POCT INR: INR: 3.7 — AB (ref 2.0–3.0)

## 2018-03-02 NOTE — Patient Instructions (Signed)
Description   Do not take any Coumadin today then continue taking Coumadin 2 tablets everyday except 1.5 tablets on Mondays and Fridays. Recheck INR in 3 weeks. Call with any changes or new medications [phone]

## 2018-03-03 DIAGNOSIS — M545 Low back pain: Secondary | ICD-10-CM | POA: Diagnosis not present

## 2018-03-03 DIAGNOSIS — M25561 Pain in right knee: Secondary | ICD-10-CM | POA: Diagnosis not present

## 2018-03-03 DIAGNOSIS — M25511 Pain in right shoulder: Secondary | ICD-10-CM | POA: Diagnosis not present

## 2018-03-14 ENCOUNTER — Other Ambulatory Visit: Payer: Self-pay | Admitting: Family Medicine

## 2018-03-17 ENCOUNTER — Other Ambulatory Visit: Payer: Self-pay

## 2018-03-20 DIAGNOSIS — M545 Low back pain: Secondary | ICD-10-CM | POA: Diagnosis not present

## 2018-03-20 DIAGNOSIS — Z961 Presence of intraocular lens: Secondary | ICD-10-CM | POA: Diagnosis not present

## 2018-03-20 DIAGNOSIS — M25561 Pain in right knee: Secondary | ICD-10-CM | POA: Diagnosis not present

## 2018-03-20 DIAGNOSIS — M25511 Pain in right shoulder: Secondary | ICD-10-CM | POA: Diagnosis not present

## 2018-03-20 DIAGNOSIS — H53032 Strabismic amblyopia, left eye: Secondary | ICD-10-CM | POA: Diagnosis not present

## 2018-03-20 DIAGNOSIS — H04123 Dry eye syndrome of bilateral lacrimal glands: Secondary | ICD-10-CM | POA: Diagnosis not present

## 2018-03-20 DIAGNOSIS — H02101 Unspecified ectropion of right upper eyelid: Secondary | ICD-10-CM | POA: Diagnosis not present

## 2018-03-23 ENCOUNTER — Ambulatory Visit (INDEPENDENT_AMBULATORY_CARE_PROVIDER_SITE_OTHER): Payer: Medicare Other

## 2018-03-23 DIAGNOSIS — I4892 Unspecified atrial flutter: Secondary | ICD-10-CM

## 2018-03-23 DIAGNOSIS — Z5181 Encounter for therapeutic drug level monitoring: Secondary | ICD-10-CM | POA: Diagnosis not present

## 2018-03-23 LAB — POCT INR: INR: 1.9 — AB (ref 2.0–3.0)

## 2018-03-23 NOTE — Patient Instructions (Signed)
Description   Take 3 tablets today, then resume same dosage 2 tablets everyday except 1.5 tablets on Mondays and Fridays. Recheck INR in 3 weeks. Call with any changes or new medications [phone]- [phone]

## 2018-04-13 ENCOUNTER — Ambulatory Visit (INDEPENDENT_AMBULATORY_CARE_PROVIDER_SITE_OTHER): Payer: Medicare Other | Admitting: Pharmacist

## 2018-04-13 DIAGNOSIS — I4892 Unspecified atrial flutter: Secondary | ICD-10-CM | POA: Diagnosis not present

## 2018-04-13 DIAGNOSIS — Z5181 Encounter for therapeutic drug level monitoring: Secondary | ICD-10-CM | POA: Diagnosis not present

## 2018-04-13 LAB — POCT INR: INR: 2.6 (ref 2.0–3.0)

## 2018-04-13 NOTE — Patient Instructions (Signed)
Description   Continue same dosage 2 tablets everyday except 1.5 tablets on Mondays and Fridays. Recheck INR in 4 weeks. Call with any changes or new medications [phone]       

## 2018-04-18 ENCOUNTER — Telehealth: Payer: Self-pay

## 2018-04-18 ENCOUNTER — Ambulatory Visit (INDEPENDENT_AMBULATORY_CARE_PROVIDER_SITE_OTHER): Payer: Medicare Other

## 2018-04-18 DIAGNOSIS — I495 Sick sinus syndrome: Secondary | ICD-10-CM

## 2018-04-18 NOTE — Telephone Encounter (Signed)
Spoke with pt and reminded pt of remote transmission that is due today. Pt verbalized understanding.   

## 2018-04-18 NOTE — Progress Notes (Signed)
Remote pacemaker transmission.   

## 2018-04-19 ENCOUNTER — Telehealth: Payer: Self-pay | Admitting: Cardiology

## 2018-04-19 ENCOUNTER — Encounter: Payer: Self-pay | Admitting: Cardiology

## 2018-04-19 NOTE — Telephone Encounter (Signed)
Opened in error

## 2018-05-11 ENCOUNTER — Ambulatory Visit (INDEPENDENT_AMBULATORY_CARE_PROVIDER_SITE_OTHER): Payer: Medicare Other | Admitting: *Deleted

## 2018-05-11 DIAGNOSIS — Z5181 Encounter for therapeutic drug level monitoring: Secondary | ICD-10-CM | POA: Diagnosis not present

## 2018-05-11 DIAGNOSIS — I4892 Unspecified atrial flutter: Secondary | ICD-10-CM

## 2018-05-11 LAB — POCT INR: INR: 2.4 (ref 2.0–3.0)

## 2018-05-11 NOTE — Patient Instructions (Signed)
Description   Continue same dosage 2 tablets everyday except 1.5 tablets on Mondays and Fridays. Recheck INR in 5 weeks. Call with any changes or new medications [phone]

## 2018-05-13 ENCOUNTER — Other Ambulatory Visit: Payer: Self-pay | Admitting: Internal Medicine

## 2018-05-17 DIAGNOSIS — R3915 Urgency of urination: Secondary | ICD-10-CM | POA: Diagnosis not present

## 2018-05-17 DIAGNOSIS — N401 Enlarged prostate with lower urinary tract symptoms: Secondary | ICD-10-CM | POA: Diagnosis not present

## 2018-05-21 LAB — CUP PACEART REMOTE DEVICE CHECK
Battery Remaining Longevity: 90 mo
Brady Statistic AP VS Percent: 94.63 %
Brady Statistic AS VS Percent: 5.36 %
Brady Statistic RV Percent Paced: 0.01 %
Implantable Lead Implant Date: 20170710
Implantable Lead Location: 753860
Implantable Lead Model: 5076
Implantable Pulse Generator Implant Date: 20170710
Lead Channel Impedance Value: 418 Ohm
Lead Channel Impedance Value: 456 Ohm
Lead Channel Pacing Threshold Amplitude: 0.5 V
Lead Channel Sensing Intrinsic Amplitude: 2.375 mV
Lead Channel Sensing Intrinsic Amplitude: 2.375 mV
Lead Channel Sensing Intrinsic Amplitude: 6 mV
Lead Channel Setting Pacing Amplitude: 1.5 V
Lead Channel Setting Pacing Amplitude: 3.5 V
Lead Channel Setting Pacing Pulse Width: 1 ms
Lead Channel Setting Sensing Sensitivity: 0.9 mV
MDC IDC LEAD IMPLANT DT: 20170710
MDC IDC LEAD LOCATION: 753859
MDC IDC MSMT BATTERY VOLTAGE: 3.01 V
MDC IDC MSMT LEADCHNL RA IMPEDANCE VALUE: 361 Ohm
MDC IDC MSMT LEADCHNL RA PACING THRESHOLD AMPLITUDE: 0.625 V
MDC IDC MSMT LEADCHNL RA PACING THRESHOLD PULSEWIDTH: 0.4 ms
MDC IDC MSMT LEADCHNL RV IMPEDANCE VALUE: 304 Ohm
MDC IDC MSMT LEADCHNL RV PACING THRESHOLD PULSEWIDTH: 0.4 ms
MDC IDC MSMT LEADCHNL RV SENSING INTR AMPL: 6 mV
MDC IDC SESS DTM: 20191217165245
MDC IDC STAT BRADY AP VP PERCENT: 0.01 %
MDC IDC STAT BRADY AS VP PERCENT: 0 %
MDC IDC STAT BRADY RA PERCENT PACED: 94.63 %

## 2018-06-12 ENCOUNTER — Other Ambulatory Visit: Payer: Self-pay | Admitting: Family Medicine

## 2018-06-15 ENCOUNTER — Ambulatory Visit (INDEPENDENT_AMBULATORY_CARE_PROVIDER_SITE_OTHER): Payer: Medicare Other | Admitting: *Deleted

## 2018-06-15 DIAGNOSIS — I4892 Unspecified atrial flutter: Secondary | ICD-10-CM

## 2018-06-15 DIAGNOSIS — Z5181 Encounter for therapeutic drug level monitoring: Secondary | ICD-10-CM

## 2018-06-15 LAB — POCT INR: INR: 2.2 (ref 2.0–3.0)

## 2018-06-15 NOTE — Patient Instructions (Signed)
Description   Continue same dosage 2 tablets everyday except 1.5 tablets on Mondays and Fridays. Recheck INR in 6 weeks. Call with any changes or new medications [phone]

## 2018-06-18 IMAGING — DX DG CHEST 2V
2 series · 2 of 2 positions shown · non-contrast
Comparison: 10/02/2015

CLINICAL DATA: Pacemaker inserted yesterday.  Check wire positions.

EXAM:
CHEST  2 VIEW

[chest pa]
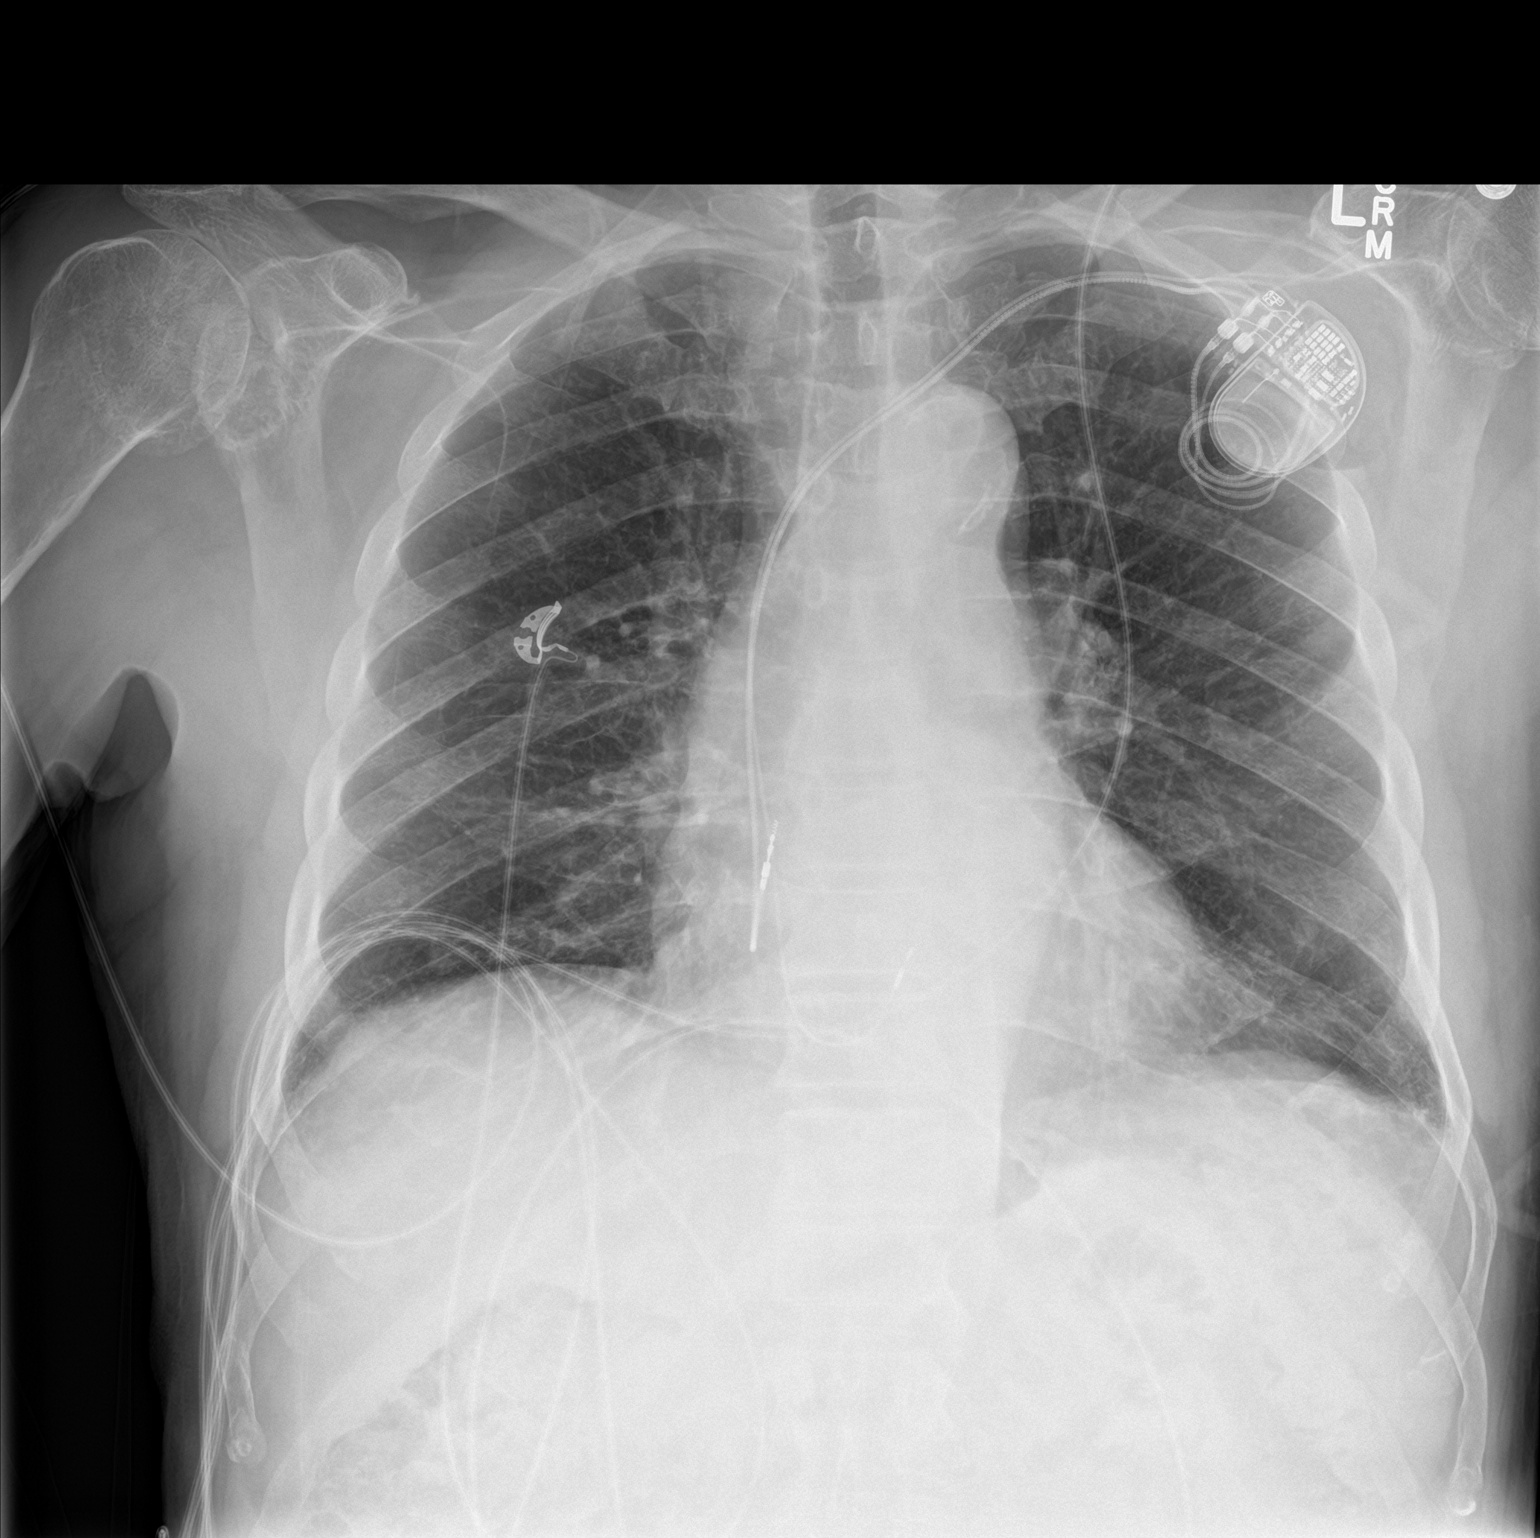

[chest lat]
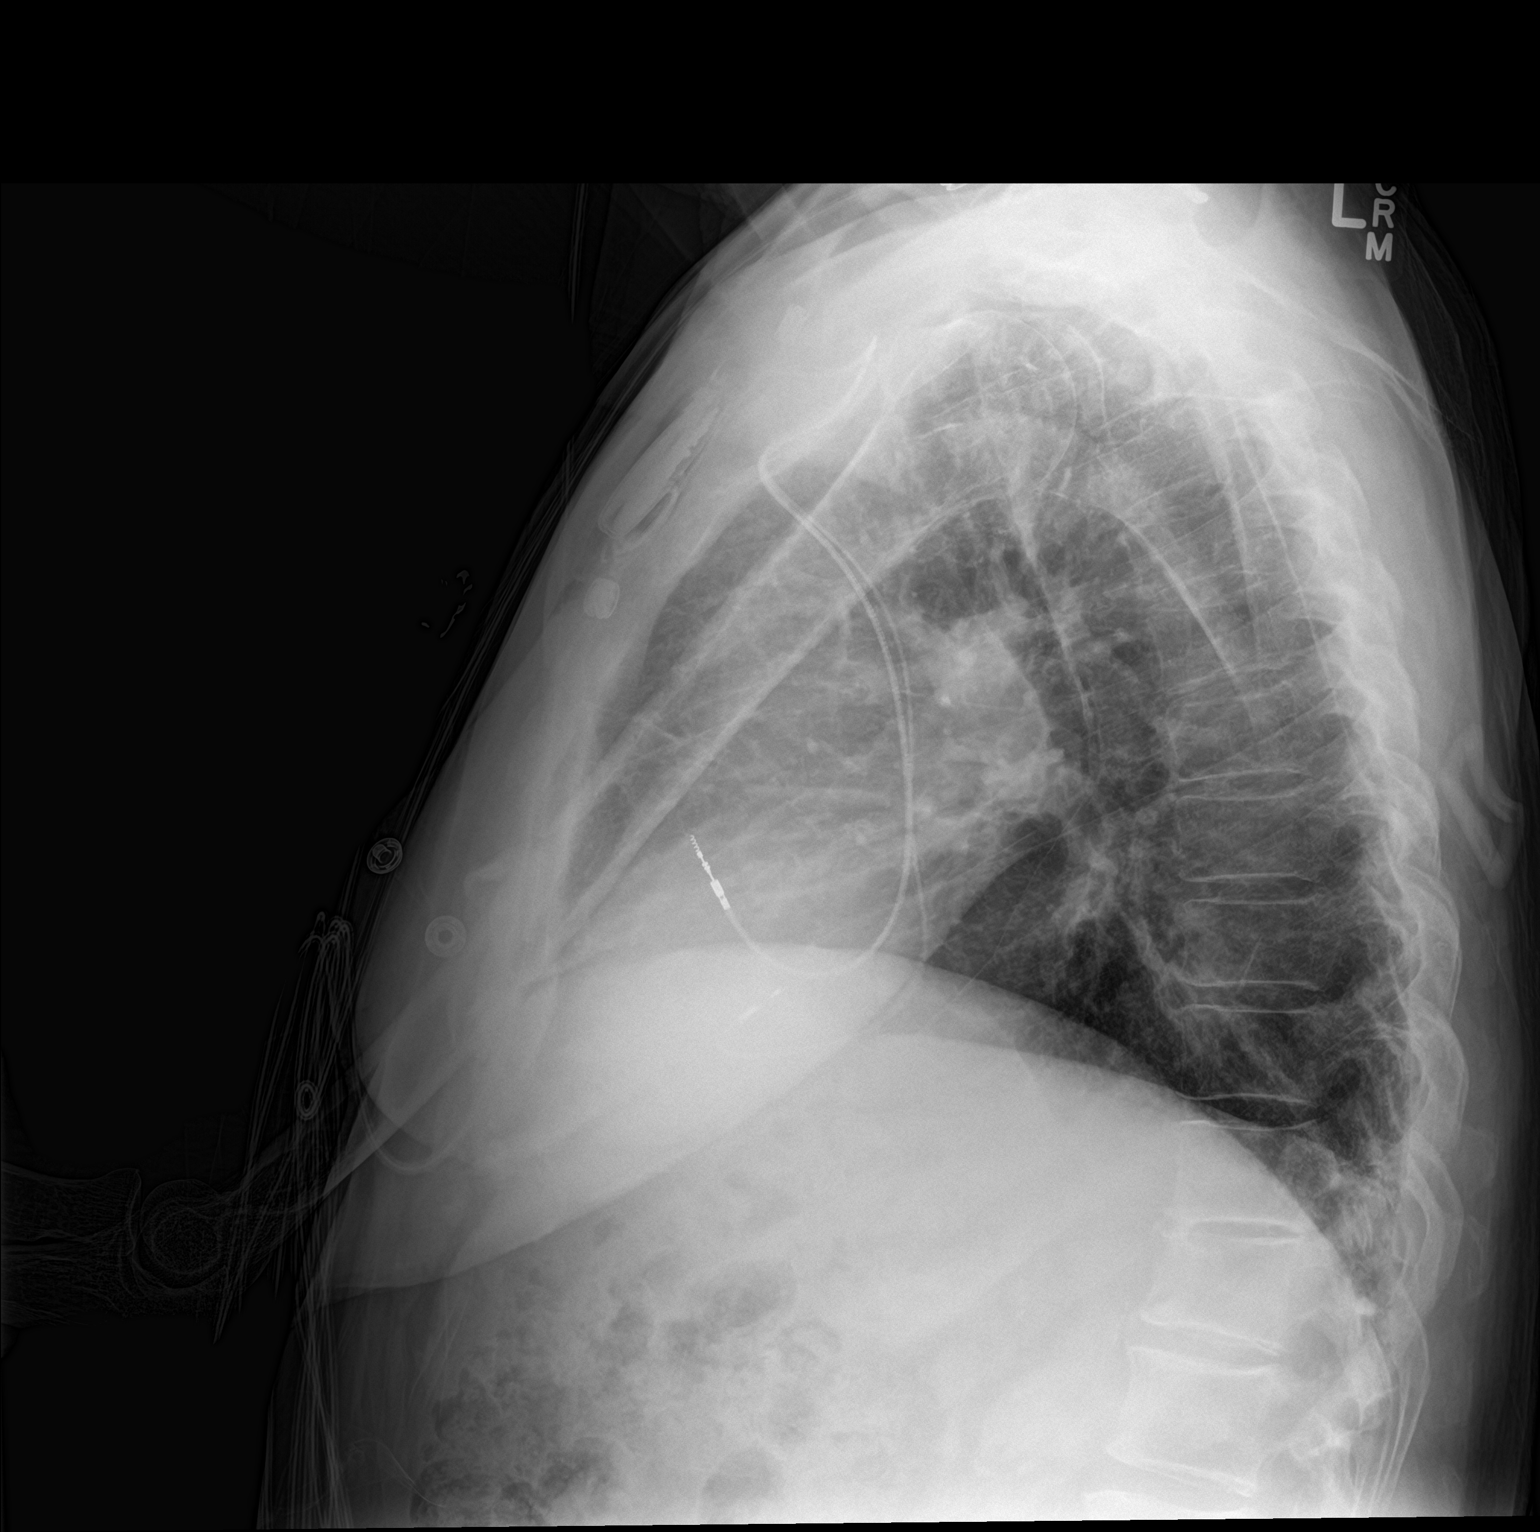

[2 of 2 positions shown; findings below may reference images not displayed]

FINDINGS: The new left anterior chest wall sequential pacemaker has its leads
projecting within the right atrium and right ventricle.

No pneumothorax.

Mild lung base scarring, stable.  Lungs are otherwise clear.

Cardiac silhouette is normal in size. No mediastinal or hilar masses
or evidence of adenopathy.
IMPRESSION: 1. New sequential pacemaker leads project in the right atrium right
ventricle. No pneumothorax.
2. No acute cardiopulmonary disease.

## 2018-07-12 ENCOUNTER — Other Ambulatory Visit: Payer: Self-pay | Admitting: Family Medicine

## 2018-07-18 ENCOUNTER — Ambulatory Visit (INDEPENDENT_AMBULATORY_CARE_PROVIDER_SITE_OTHER): Payer: Medicare Other | Admitting: *Deleted

## 2018-07-18 ENCOUNTER — Other Ambulatory Visit: Payer: Self-pay

## 2018-07-18 DIAGNOSIS — I4891 Unspecified atrial fibrillation: Secondary | ICD-10-CM

## 2018-07-18 DIAGNOSIS — I495 Sick sinus syndrome: Secondary | ICD-10-CM

## 2018-07-19 LAB — CUP PACEART REMOTE DEVICE CHECK
Battery Remaining Longevity: 82 mo
Battery Voltage: 3.01 V
Brady Statistic AP VP Percent: 0 %
Brady Statistic AP VS Percent: 96.48 %
Brady Statistic AS VP Percent: 0 %
Brady Statistic AS VS Percent: 3.52 %
Date Time Interrogation Session: 20200317142322
Implantable Lead Implant Date: 20170710
Implantable Lead Location: 753859
Implantable Lead Model: 3830
Implantable Lead Model: 5076
Implantable Pulse Generator Implant Date: 20170710
Lead Channel Impedance Value: 304 Ohm
Lead Channel Impedance Value: 380 Ohm
Lead Channel Impedance Value: 437 Ohm
Lead Channel Impedance Value: 494 Ohm
Lead Channel Pacing Threshold Amplitude: 0.5 V
Lead Channel Pacing Threshold Amplitude: 0.75 V
Lead Channel Pacing Threshold Pulse Width: 0.4 ms
Lead Channel Pacing Threshold Pulse Width: 0.4 ms
Lead Channel Sensing Intrinsic Amplitude: 2.375 mV
Lead Channel Sensing Intrinsic Amplitude: 2.375 mV
Lead Channel Sensing Intrinsic Amplitude: 6.875 mV
Lead Channel Sensing Intrinsic Amplitude: 6.875 mV
Lead Channel Setting Pacing Amplitude: 1.5 V
Lead Channel Setting Pacing Pulse Width: 1 ms
Lead Channel Setting Sensing Sensitivity: 0.9 mV
MDC IDC LEAD IMPLANT DT: 20170710
MDC IDC LEAD LOCATION: 753860
MDC IDC SET LEADCHNL RV PACING AMPLITUDE: 3.5 V
MDC IDC STAT BRADY RA PERCENT PACED: 96.46 %
MDC IDC STAT BRADY RV PERCENT PACED: 0 %

## 2018-07-25 ENCOUNTER — Telehealth: Payer: Self-pay

## 2018-07-25 NOTE — Telephone Encounter (Signed)

## 2018-07-26 NOTE — Progress Notes (Signed)
Remote pacemaker transmission.   

## 2018-07-27 ENCOUNTER — Ambulatory Visit (INDEPENDENT_AMBULATORY_CARE_PROVIDER_SITE_OTHER): Payer: Medicare Other | Admitting: Pharmacist

## 2018-07-27 ENCOUNTER — Other Ambulatory Visit: Payer: Self-pay

## 2018-07-27 DIAGNOSIS — I4892 Unspecified atrial flutter: Secondary | ICD-10-CM | POA: Diagnosis not present

## 2018-07-27 DIAGNOSIS — Z5181 Encounter for therapeutic drug level monitoring: Secondary | ICD-10-CM | POA: Diagnosis not present

## 2018-07-27 LAB — POCT INR: INR: 2.3 (ref 2.0–3.0)

## 2018-07-27 MED ORDER — APIXABAN 5 MG PO TABS: 5.0000 mg | ORAL_TABLET | Freq: Two times a day (BID) | ORAL | 3 refills | Status: DC

## 2018-08-04 ENCOUNTER — Telehealth: Payer: Self-pay | Admitting: Internal Medicine

## 2018-08-04 NOTE — Telephone Encounter (Signed)
New Message             Patient is calling in because his device is flashing and he has to turn it off  So that he can sleep, patient is needing some assistance with his device. Pls advise.

## 2018-08-04 NOTE — Telephone Encounter (Signed)
Spoke w/ pt and informed him that his monitor may have been getting a softwear update. Pt states that the monitor is fine today. I instructed pt that if it happens again in the future to call [employer] tech support and they will be able to answer these questions better. Pt verbalized understanding.

## 2018-08-05 ENCOUNTER — Other Ambulatory Visit: Payer: Self-pay | Admitting: Family Medicine

## 2018-08-07 ENCOUNTER — Other Ambulatory Visit: Payer: Self-pay

## 2018-08-07 ENCOUNTER — Ambulatory Visit (INDEPENDENT_AMBULATORY_CARE_PROVIDER_SITE_OTHER): Payer: Medicare Other | Admitting: Family Medicine

## 2018-08-07 DIAGNOSIS — E039 Hypothyroidism, unspecified: Secondary | ICD-10-CM

## 2018-08-07 DIAGNOSIS — N183 Chronic kidney disease, stage 3 unspecified: Secondary | ICD-10-CM

## 2018-08-07 DIAGNOSIS — I10 Essential (primary) hypertension: Secondary | ICD-10-CM | POA: Diagnosis not present

## 2018-08-07 DIAGNOSIS — E785 Hyperlipidemia, unspecified: Secondary | ICD-10-CM | POA: Diagnosis not present

## 2018-08-07 DIAGNOSIS — I4892 Unspecified atrial flutter: Secondary | ICD-10-CM | POA: Diagnosis not present

## 2018-08-07 MED ORDER — AMLODIPINE BESYLATE 5 MG PO TABS: 5.0000 mg | ORAL_TABLET | Freq: Every day | ORAL | 3 refills | Status: DC

## 2018-08-07 MED ORDER — LEVOTHYROXINE SODIUM 50 MCG PO TABS: 50.0000 ug | ORAL_TABLET | Freq: Every day | ORAL | 3 refills | Status: DC

## 2018-08-07 MED ORDER — ATORVASTATIN CALCIUM 10 MG PO TABS: 10.0000 mg | ORAL_TABLET | Freq: Every day | ORAL | 3 refills | Status: DC

## 2018-08-07 NOTE — Telephone Encounter (Signed)
Refilled at visit today.

## 2018-08-07 NOTE — Progress Notes (Signed)
Patient ID: [name], male   DOB: [birth year], 85 y.o.   MRN: [id number]  Virtual Visit via Video Note  I connected with [name] on 08/07/18 at 10:15 AM EDT by a video enabled telemedicine application and verified that I am speaking with the correct person using two identifiers.  Location patient: home Location provider:work or home office Persons participating in the virtual visit: patient, provider  I discussed the limitations of evaluation and management by telemedicine and the availability of in person appointments. The patient expressed understanding and agreed to proceed.   HPI:  Patient is seen for medical follow-up.  We attempted video visit but had some technical difficulties.  His chronic problems include history of nonischemic cardiomyopathy, atrial fibrillation, hypertension, hyperlipidemia, hypothyroidism.  He is in need of several medication refills including amlodipine, Lipitor, and levothyroxine.  He is overdue for labs.  Weight has been stable.  Weight this morning 157 pounds.  Blood pressure stable.  119/78 this morning with heart rate of 82.  Generally feels well.  No recent fever.  No dyspnea.  No chest pains.  Compliant with medications.  Denies any medication side effects.   ROS: See pertinent positives and negatives per HPI.  Past Medical History:  Diagnosis Date  . AF (atrial fibrillation) [facility])    a. s/p RFCA/PVI @ [facility] clinic 2007.  . Arthritis    "joints" (06/21/2013)  . Atrial flutter ([place])    a. 06/2013 s/p TEE/DCCV  . Chronic systolic CHF (congestive heart failure) ([place])    a. 06/2013 Echo: EF 20%, diff HK, mild LVH.  Marland Kitchen History of cardiovascular stress test    Lexiscan Myoview 8/16:  EF 59%, attenuation artifact, no ischemia; Low Risk  . Hyperlipidemia   . HYPERTENSION, BENIGN 08/21/2009   Qualifier: Diagnosis of  By: Caryl Comes, MD, Remus Blake  Medications(s)  enalapril   . Hypothyroidism   . Neoplasm of uncertain behavior    SKIN  .  Polymyalgia rheumatica ([place]) 01/13/2007   Qualifier: History of  By: Danny Lawless CMA, [place]       Past Surgical History:  Procedure Laterality Date  . ATRIAL FIBRILLATION ABLATION  2007  . CARDIOVERSION N/A 06/22/2013   Procedure: CARDIOVERSION;  Surgeon: Sueanne Margarita, MD;  Location: [facility] ENDOSCOPY;  Service: Cardiovascular;  Laterality: N/A;  15:17 Lido 20mg ,IV , Propofol 30 mg, IV synched cardioversion @ 150 joules by Dr. Lajoyce Lauber from a flutter to sinis brady which is baseline for this pt, reports 45-50 reg HR verified by Dr. Ashok Norris  . CARDIOVERSION N/A 11/21/2014   Procedure: CARDIOVERSION;  Surgeon: Fay Records, MD;  Location: [facility] ENDOSCOPY;  Service: Cardiovascular;  Laterality: N/A;  . CARDIOVERSION N/A 07/14/2015   Procedure: CARDIOVERSION;  Surgeon: Sueanne Margarita, MD;  Location: [place];  Service: Cardiovascular;  Laterality: N/A;  . CATARACT EXTRACTION, BILATERAL Bilateral Summer 2009  . CHOLECYSTECTOMY    . EP IMPLANTABLE DEVICE N/A 11/10/2015   Procedure: Pacemaker Implant;  Surgeon: [name], MD;  Location: [place] CV LAB;  Service: Cardiovascular;  Laterality: N/A;  . HAND SURGERY Right    "broke bone; grafted area w/bone from somewhere else"  . HEMORRHOID SURGERY    . INGUINAL HERNIA REPAIR Bilateral   . TEE WITHOUT CARDIOVERSION N/A 06/22/2013   Procedure: TRANSESOPHAGEAL ECHOCARDIOGRAM (TEE);  Surgeon: Sueanne Margarita, MD;  Location: [facility] ENDOSCOPY;  Service: Cardiovascular;  Laterality: N/A;  . TEE WITHOUT CARDIOVERSION N/A 07/14/2015   Procedure: TRANSESOPHAGEAL ECHOCARDIOGRAM (TEE);  Surgeon: Sueanne Margarita, MD;  Location: [facility] ENDOSCOPY;  Service: Cardiovascular;  Laterality: N/A;  . TONSILLECTOMY      Family History  Problem Relation Age of Onset  . Emphysema Mother   . Diabetes Mother   . Kidney disease Father     SOCIAL HX: Non-smoker   Current Outpatient Medications:  .  amLODipine (NORVASC) 5 MG tablet, Take 1 tablet (5 mg total) by mouth daily.,  Disp: 90 tablet, Rfl: 3 .  atorvastatin (LIPITOR) 10 MG tablet, Take 1 tablet (10 mg total) by mouth daily., Disp: 90 tablet, Rfl: 3 .  cholecalciferol (VITAMIN D) 1000 UNITS tablet, Take 1,000 Units by mouth every morning., Disp: , Rfl:  .  furosemide (LASIX) 20 MG tablet, TAKE 1 TABLET BY MOUTH EVERY DAY, Disp: 90 tablet, Rfl: 0 .  Glucosamine-Chondroitin 500-400 MG CAPS, Take 1 capsule by mouth every morning., Disp: , Rfl:  .  levothyroxine (SYNTHROID, LEVOTHROID) 50 MCG tablet, Take 1 tablet (50 mcg total) by mouth daily., Disp: 90 tablet, Rfl: 3 .  metoprolol succinate (TOPROL-XL) 50 MG 24 hr tablet, TAKE 1 TABLET( 50 MG TOTAL) BY MOUTH DAILY. TAKE WITH OR IMMEDIATELY FOLLOWING A MEAL., Disp: 90 tablet, Rfl: 3 .  Multiple Vitamins-Minerals (CENTRUM SILVER PO), Take 1 tablet by mouth every morning. , Disp: , Rfl:  .  ondansetron (ZOFRAN ODT) 4 MG disintegrating tablet, Take 1 tablet (4 mg total) every 8 (eight) hours as needed by mouth for nausea or vomiting., Disp: 15 tablet, Rfl: 0 .  warfarin (COUMADIN) 2 MG tablet, TAKE BY MOUTH AS DIRECTED BY COUMADIN CLINIC, Disp: 180 tablet, Rfl: 1  EXAM:  VITALS per patient if applicable:  GENERAL: alert, oriented, appears well and in no acute distress  HEENT: atraumatic, conjunttiva clear, no obvious abnormalities on inspection of external nose and ears  NECK: normal movements of the head and neck  LUNGS: on inspection no signs of respiratory distress, breathing rate appears normal, no obvious gross SOB, gasping or wheezing  CV: no obvious cyanosis  MS: moves all visible extremities without noticeable abnormality  PSYCH/NEURO: pleasant and cooperative, no obvious depression or anxiety, speech and thought processing grossly intact  ASSESSMENT AND PLAN:  Discussed the following assessment and plan:  #1 hypertension stable by home readings -Refill amlodipine for 1 year  #2 hyperlipidemia.  Patient treated with Lipitor.  Overdue for  labs -Refilled Lipitor and will plan office follow-up and get follow-up labs hopefully within 3 months  #3 hypothyroidism -Needs follow-up TSH.  Refill levothyroxine for several months until we can get office follow-up  #4 history of atrial fibrillation/flutter.  Patient remains on Coumadin -Continue close follow-up with Coumadin clinic     I discussed the assessment and treatment plan with the patient. The patient was provided an opportunity to ask questions and all were answered. The patient agreed with the plan and demonstrated an understanding of the instructions.   The patient was advised to call back or seek an in-person evaluation if the symptoms worsen or if the condition fails to improve as anticipated.  Carolann Littler, MD

## 2018-09-15 ENCOUNTER — Telehealth: Payer: Self-pay

## 2018-09-15 NOTE — Telephone Encounter (Signed)
I called and spoke with patient about upcoming appointment on 09/21/18, he is ok with switching office visit to virtual video visit.       Virtual Visit Pre-Appointment Phone Call  "(Name), I am calling you today to discuss your upcoming appointment. We are currently trying to limit exposure to the virus that causes COVID-19 by seeing patients at home rather than in the office."  1. "What is the BEST phone number to call the day of the visit?" - include this in appointment notes  2. "Do you have or have access to (through a family member/friend) a smartphone with video capability that we can use for your visit?" a. If yes - list this number in appt notes as "cell" (if different from BEST phone #) and list the appointment type as a VIDEO visit in appointment notes b. If no - list the appointment type as a PHONE visit in appointment notes  3. Confirm consent - "In the setting of the current Covid19 crisis, you are scheduled for a (phone or video) visit with your provider on (date) at (time).  Just as we do with many in-office visits, in order for you to participate in this visit, we must obtain consent.  If you'd like, I can send this to your mychart (if signed up) or email for you to review.  Otherwise, I can obtain your verbal consent now.  All virtual visits are billed to your insurance company just like a normal visit would be.  By agreeing to a virtual visit, we'd like you to understand that the technology does not allow for your provider to perform an examination, and thus may limit your provider's ability to fully assess your condition. If your provider identifies any concerns that need to be evaluated in person, we will make arrangements to do so.  Finally, though the technology is pretty good, we cannot assure that it will always work on either your or our end, and in the setting of a video visit, we may have to convert it to a phone-only visit.  In either situation, we cannot ensure that we  have a secure connection.  Are you willing to proceed?" STAFF: Did the patient verbally acknowledge consent to telehealth visit? Document YES/NO here: YES  4. Advise patient to be prepared - "Two hours prior to your appointment, go ahead and check your blood pressure, pulse, oxygen saturation, and your weight (if you have the equipment to check those) and write them all down. When your visit starts, your provider will ask you for this information. If you have an Apple Watch or Kardia device, please plan to have heart rate information ready on the day of your appointment. Please have a pen and paper handy nearby the day of the visit as well."  5. Give patient instructions for MyChart download to smartphone OR [email or web address] as below if video visit (depending on what platform provider is using)  6. Inform patient they will receive a phone call 15 minutes prior to their appointment time (may be from unknown caller ID) so they should be prepared to answer    TELEPHONE CALL NOTE  [name] has been deemed a candidate for a follow-up tele-health visit to limit community exposure during the Covid-19 pandemic. I spoke with the patient via phone to ensure availability of phone/video source, confirm preferred email & phone number, and discuss instructions and expectations.  I reminded [name] to be prepared with any vital sign and/or heart rhythm information  that could potentially be obtained via home monitoring, at the time of his visit. I reminded [name] to expect a phone call prior to his visit.  [name], [place] 09/15/2018 5:03 PM   INSTRUCTIONS FOR DOWNLOADING THE MYCHART APP TO SMARTPHONE  - The patient must first make sure to have activated MyChart and know their login information - If Apple, go to [employer] and type in MyChart in the search bar and download the app. If Android, ask patient to go to [employer] and type in [place] in the search bar and download the  app. The app is free but as with any other app downloads, their phone may require them to verify saved payment information or Apple/Android password.  - The patient will need to then log into the app with their MyChart username and password, and select [facility] as their healthcare provider to link the account. When it is time for your visit, go to the MyChart app, find appointments, and click Begin Video Visit. Be sure to Select Allow for your device to access the Microphone and Camera for your visit. You will then be connected, and your provider will be with you shortly.  **If they have any issues connecting, or need assistance please contact MyChart service desk [phone])**  **If using a computer, in order to ensure the best quality for their visit they will need to use either of the following Internet Browsers: [employer], or Google Chrome**  IF USING DOXIMITY or DOXY.ME - The patient will receive a link just prior to their visit by text.     FULL LENGTH CONSENT FOR TELE-HEALTH VISIT   I hereby voluntarily request, consent and authorize [facility] and its employed or contracted physicians, physician assistants, nurse practitioners or other licensed health care professionals (the Practitioner), to provide me with telemedicine health care services (the "Services") as deemed necessary by the treating Practitioner. I acknowledge and consent to receive the Services by the Practitioner via telemedicine. I understand that the telemedicine visit will involve communicating with the Practitioner through live audiovisual communication technology and the disclosure of certain medical information by electronic transmission. I acknowledge that I have been given the opportunity to request an in-person assessment or other available alternative prior to the telemedicine visit and am voluntarily participating in the telemedicine visit.  I understand that I have the right to withhold or  withdraw my consent to the use of telemedicine in the course of my care at any time, without affecting my right to future care or treatment, and that the Practitioner or I may terminate the telemedicine visit at any time. I understand that I have the right to inspect all information obtained and/or recorded in the course of the telemedicine visit and may receive copies of available information for a reasonable fee.  I understand that some of the potential risks of receiving the Services via telemedicine include:  Marland Kitchen Delay or interruption in medical evaluation due to technological equipment failure or disruption; . Information transmitted may not be sufficient (e.g. poor resolution of images) to allow for appropriate medical decision making by the Practitioner; and/or  . In rare instances, security protocols could fail, causing a breach of personal health information.  Furthermore, I acknowledge that it is my responsibility to provide information about my medical history, conditions and care that is complete and accurate to the best of my ability. I acknowledge that Practitioner's advice, recommendations, and/or decision may be based on factors  not within their control, such as incomplete or inaccurate data provided by me or distortions of diagnostic images or specimens that may result from electronic transmissions. I understand that the practice of medicine is not an exact science and that Practitioner makes no warranties or guarantees regarding treatment outcomes. I acknowledge that I will receive a copy of this consent concurrently upon execution via email to the email address I last provided but may also request a printed copy by calling the office of [facility].    I understand that my insurance will be billed for this visit.   I have read or had this consent read to me. . I understand the contents of this consent, which adequately explains the benefits and risks of the Services being provided via  telemedicine.  . I have been provided ample opportunity to ask questions regarding this consent and the Services and have had my questions answered to my satisfaction. . I give my informed consent for the services to be provided through the use of telemedicine in my medical care  By participating in this telemedicine visit I agree to the above.

## 2018-09-20 ENCOUNTER — Telehealth: Payer: Self-pay

## 2018-09-20 NOTE — Telephone Encounter (Signed)

## 2018-09-21 ENCOUNTER — Ambulatory Visit (INDEPENDENT_AMBULATORY_CARE_PROVIDER_SITE_OTHER): Payer: Medicare Other | Admitting: Pharmacist

## 2018-09-21 ENCOUNTER — Telehealth (INDEPENDENT_AMBULATORY_CARE_PROVIDER_SITE_OTHER): Payer: Medicare Other | Admitting: Internal Medicine

## 2018-09-21 ENCOUNTER — Other Ambulatory Visit: Payer: Self-pay

## 2018-09-21 VITALS — BP 129/78 | HR 86 | Ht 68.0 in | Wt 157.6 lb

## 2018-09-21 DIAGNOSIS — Z5181 Encounter for therapeutic drug level monitoring: Secondary | ICD-10-CM | POA: Diagnosis not present

## 2018-09-21 DIAGNOSIS — I4891 Unspecified atrial fibrillation: Secondary | ICD-10-CM | POA: Diagnosis not present

## 2018-09-21 DIAGNOSIS — Z950 Presence of cardiac pacemaker: Secondary | ICD-10-CM

## 2018-09-21 DIAGNOSIS — I495 Sick sinus syndrome: Secondary | ICD-10-CM

## 2018-09-21 DIAGNOSIS — I4892 Unspecified atrial flutter: Secondary | ICD-10-CM

## 2018-09-21 LAB — POCT INR: INR: 2.6 (ref 2.0–3.0)

## 2018-09-21 MED ORDER — APIXABAN 5 MG PO TABS: 5.0000 mg | ORAL_TABLET | Freq: Two times a day (BID) | ORAL | 3 refills | Status: DC

## 2018-09-21 NOTE — Progress Notes (Signed)
Electrophysiology TeleHealth Note   Due to national recommendations of social distancing due to [name] 19, an audio/video telehealth visit is felt to be most appropriate for this patient at this time.  See MyChart message from today for the patient's consent to telehealth for [facility].   Date:  09/21/2018   ID:  [name], DOB March 16, 1926, MRN [id number]  Location: patient's home  Provider location: [address], [place]  Evaluation Performed: Follow-up visit  PCP:  Gabriel Post, MD  Cardiologist:     Electrophysiologist:  [name]   Chief Complaint:  Tachy-brady syndrome  History of Present Illness:    [name] is a 85 y.o. male who presents via audio/video conferencing for a telehealth visit today. The patient did not have access to video technology/had technical difficulties with video requiring transitioning to audio format only (telephone).  All issues noted in this document were discussed and addressed.  No physical exam could be performed with this format.     Since last being seen in our [facility], the patient reports doing quite well and weathering [name] with great aplomb  The patient denies chest pain, shortness of breath, nocturnal dyspnea, orthopnea or peripheral edema.  There have been no palpitations, lightheadedness or syncope.    Still does am calisthenics   No clincal bleeding  Date Cr Hgb  6/18 1.1    11/18 1.2 14.0  5/19  13.0     The patient denies symptoms of fevers, chills, cough, or new SOB worrisome for [name] 19.   Past Medical History:  Diagnosis Date  . AF (atrial fibrillation) [name])    a. s/p RFCA/PVI @ [name] 2007.  . Arthritis    "joints" (06/21/2013)  . Atrial flutter ([name])    a. 06/2013 s/p TEE/DCCV  . Chronic systolic CHF (congestive heart failure) ([name])    a. 06/2013 Echo: EF 20%, diff HK, mild LVH.  Marland Kitchen History of cardiovascular stress test    Lexiscan Myoview 8/16:  EF 59%, attenuation artifact, no  ischemia; Low Risk  . Hyperlipidemia   . HYPERTENSION, BENIGN 08/21/2009   Qualifier: Diagnosis of  By: Gabriel Comes, MD, [name]  Medications(s)  enalapril   . Hypothyroidism   . Neoplasm of uncertain behavior    SKIN  . Polymyalgia rheumatica ([place]) 01/13/2007   Qualifier: History of  By: [name] CMA, [place]       Past Surgical History:  Procedure Laterality Date  . ATRIAL FIBRILLATION ABLATION  2007  . CARDIOVERSION N/A 06/22/2013   Procedure: CARDIOVERSION;  Surgeon: Gabriel Margarita, MD;  Location: [name];  Service: Cardiovascular;  Laterality: N/A;  15:17 Lido 20mg ,[facility] , Propofol 30 mg, [facility] synched cardioversion @ 150 joules by Dr. Lajoyce Mccoy from a flutter to sinis brady which is baseline for this pt, reports 45-50 reg HR verified by Dr. Ashok Mccoy  . CARDIOVERSION N/A 11/21/2014   Procedure: CARDIOVERSION;  Surgeon: Gabriel Records, MD;  Location: [name];  Service: Cardiovascular;  Laterality: N/A;  . CARDIOVERSION N/A 07/14/2015   Procedure: CARDIOVERSION;  Surgeon: Gabriel Margarita, MD;  Location: [name];  Service: Cardiovascular;  Laterality: N/A;  . CATARACT EXTRACTION, BILATERAL Bilateral [name]  . CHOLECYSTECTOMY    . EP IMPLANTABLE DEVICE N/A 11/10/2015   Procedure: Pacemaker Implant;  Surgeon: Gabriel Sprang, MD;  Location: [name];  Service: Cardiovascular;  Laterality: N/A;  . HAND SURGERY Right    "broke bone; grafted  area w/bone from somewhere else"  . HEMORRHOID SURGERY    . INGUINAL HERNIA REPAIR Bilateral   . TEE WITHOUT CARDIOVERSION N/A 06/22/2013   Procedure: TRANSESOPHAGEAL ECHOCARDIOGRAM (TEE);  Surgeon: Gabriel Margarita, MD;  Location: [name];  Service: Cardiovascular;  Laterality: N/A;  . TEE WITHOUT CARDIOVERSION N/A 07/14/2015   Procedure: TRANSESOPHAGEAL ECHOCARDIOGRAM (TEE);  Surgeon: Gabriel Margarita, MD;  Location: [name];  Service: Cardiovascular;  Laterality: N/A;  . TONSILLECTOMY      Current Outpatient Medications    Medication Sig Dispense Refill  . amLODipine (NORVASC) 5 MG tablet Take 1 tablet (5 mg total) by mouth daily. 90 tablet 3  . atorvastatin (LIPITOR) 10 MG tablet Take 1 tablet (10 mg total) by mouth daily. 90 tablet 3  . cholecalciferol (VITAMIN D) 1000 UNITS tablet Take 1,000 Units by mouth every morning.    . furosemide (LASIX) 20 MG tablet TAKE 1 TABLET BY MOUTH EVERY DAY (Patient taking differently: Take 20 mg by mouth every other day. Mon Wed Fri) 90 tablet 0  . Glucosamine-Chondroitin 500-400 MG CAPS Take 1 capsule by mouth every morning.    Marland Kitchen levothyroxine (SYNTHROID, LEVOTHROID) 50 MCG tablet Take 1 tablet (50 mcg total) by mouth daily. 90 tablet 3  . metoprolol succinate (TOPROL-XL) 50 MG 24 hr tablet TAKE 1 TABLET( 50 MG TOTAL) BY MOUTH DAILY. TAKE WITH OR IMMEDIATELY FOLLOWING A MEAL. 90 tablet 3  . Multiple Vitamins-Minerals (CENTRUM SILVER PO) Take 1 tablet by mouth every morning.     . warfarin (COUMADIN) 2 MG tablet TAKE BY MOUTH AS DIRECTED BY COUMADIN M[facility] 180 tablet 1   No current facility-administered medications for this visit.     Allergies:   Patient has no known allergies.   Social History:  The patient  reports that he quit smoking about 55 years ago. His smoking use included cigarettes. He has a 12.00 pack-year smoking history. He has never used smokeless tobacco. He reports current alcohol use. He reports that he does not use drugs.   Family History:  The patient's   family history includes Diabetes in his mother; Emphysema in his mother; Kidney disease in his father.   ROS:  Please see the history of present illness.   All other systems are personally reviewed and negative.    Exam:    Vital Signs:  BP 129/78   Pulse 86   Ht 5\' 8"  (1.727 m)   Wt 157 lb 9.6 oz (71.5 kg)   BMI 23.96 kg/m        Labs/Other Tests and Data Reviewed:    Recent Labs: No results found for requested labs within last 8760 hours.   Wt Readings from Last 3 Encounters:   09/21/18 157 lb 9.6 oz (71.5 kg)  02/16/18 164 lb (74.4 kg)  09/15/17 167 lb (75.8 kg)     Other studies personally reviewed: Additional studies/ [facility] that were reviewed today include:As above *    Last device remote is reviewed from [facility] PDF dated 3/20  which reveals normal device function,   arrhythmias - none * *   ASSESSMENT & PLAN:    Atrial Flutter   sinus bradycardia   NICM thought to be rate related  Resolved  Dyspnea on exertion  Pacemaker-[employer] (DOI 7/17)  Hypertension.  No interval atrial arrhythmias  Device function normal  Euvolemic  No bleeding, Hgb stable but labs need to be redrawn-- have sent staff msg to PCP to discuss whether he would like to  do or have [place] do  [name] 19 screen The patient denies symptoms of [name] 19 at this time.  The importance of social distancing was discussed today.  Follow-up:  78m Next remote: As Scheduled   Current medicines are reviewed at length with the patient today.   The patient does not have concerns regarding his medicines.  The following changes were made today:  none  Labs/ tests ordered today include: CBC BMET either by PCP or [place]-- await his input No orders of the defined types were placed in this encounter.   Future tests ( [name] )     Patient Risk:  after full review of this patients clinical status, I feel that they are at moderate risk at this time.  Today, I have spent 9 minutes with the patient with telehealth technology discussing the above.  Signed, Virl Axe, MD  09/21/2018 3:27 PM     [place] [address] [place] [place] [address] [phone] (office) [phone] (fax)

## 2018-09-23 ENCOUNTER — Other Ambulatory Visit: Payer: Self-pay | Admitting: Family Medicine

## 2018-09-26 ENCOUNTER — Other Ambulatory Visit: Payer: Self-pay

## 2018-09-26 DIAGNOSIS — E039 Hypothyroidism, unspecified: Secondary | ICD-10-CM

## 2018-09-26 DIAGNOSIS — Z5181 Encounter for therapeutic drug level monitoring: Secondary | ICD-10-CM

## 2018-09-26 DIAGNOSIS — I10 Essential (primary) hypertension: Secondary | ICD-10-CM

## 2018-09-26 DIAGNOSIS — E785 Hyperlipidemia, unspecified: Secondary | ICD-10-CM

## 2018-10-04 ENCOUNTER — Other Ambulatory Visit: Payer: Self-pay

## 2018-10-04 ENCOUNTER — Other Ambulatory Visit (INDEPENDENT_AMBULATORY_CARE_PROVIDER_SITE_OTHER): Payer: Medicare Other

## 2018-10-04 DIAGNOSIS — E785 Hyperlipidemia, unspecified: Secondary | ICD-10-CM | POA: Diagnosis not present

## 2018-10-04 DIAGNOSIS — Z7901 Long term (current) use of anticoagulants: Secondary | ICD-10-CM | POA: Diagnosis not present

## 2018-10-04 DIAGNOSIS — E039 Hypothyroidism, unspecified: Secondary | ICD-10-CM

## 2018-10-04 DIAGNOSIS — Z5181 Encounter for therapeutic drug level monitoring: Secondary | ICD-10-CM | POA: Diagnosis not present

## 2018-10-04 DIAGNOSIS — I10 Essential (primary) hypertension: Secondary | ICD-10-CM

## 2018-10-04 LAB — CBC WITH DIFFERENTIAL/PLATELET
Basophils Absolute: 0 10*3/uL (ref 0.0–0.1)
Basophils Relative: 0.8 % (ref 0.0–3.0)
Eosinophils Absolute: 0.1 10*3/uL (ref 0.0–0.7)
Eosinophils Relative: 2.6 % (ref 0.0–5.0)
HCT: 40.1 % (ref 39.0–52.0)
Hemoglobin: 13.6 g/dL (ref 13.0–17.0)
Lymphocytes Relative: 19.3 % (ref 12.0–46.0)
Lymphs Abs: 1 10*3/uL (ref 0.7–4.0)
MCHC: 34 g/dL (ref 30.0–36.0)
MCV: 91.9 fl (ref 78.0–100.0)
Monocytes Absolute: 0.5 10*3/uL (ref 0.1–1.0)
Monocytes Relative: 10.6 % (ref 3.0–12.0)
Neutro Abs: 3.4 10*3/uL (ref 1.4–7.7)
Neutrophils Relative %: 66.7 % (ref 43.0–77.0)
Platelets: 157 10*3/uL (ref 150.0–400.0)
RBC: 4.36 Mil/uL (ref 4.22–5.81)
RDW: 13.6 % (ref 11.5–15.5)
WBC: 5.1 10*3/uL (ref 4.0–10.5)

## 2018-10-04 LAB — COMPREHENSIVE METABOLIC PANEL
ALT: 5 U/L (ref 0–53)
AST: 16 U/L (ref 0–37)
Albumin: 4.3 g/dL (ref 3.5–5.2)
Alkaline Phosphatase: 76 U/L (ref 39–117)
BUN: 24 mg/dL — ABNORMAL HIGH (ref 6–23)
CO2: 31 mEq/L (ref 19–32)
Calcium: 9.4 mg/dL (ref 8.4–10.5)
Chloride: 103 mEq/L (ref 96–112)
Creatinine, Ser: 1.12 mg/dL (ref 0.40–1.50)
GFR: 61.5 mL/min (ref >60.00)
Glucose, Bld: 93 mg/dL (ref 70–99)
Potassium: 5.1 mEq/L (ref 3.5–5.1)
Sodium: 140 mEq/L (ref 135–145)
Total Bilirubin: 0.8 mg/dL (ref 0.2–1.2)
Total Protein: 6.9 g/dL (ref 6.0–8.3)

## 2018-10-04 LAB — LIPID PANEL
Cholesterol: 154 mg/dL (ref 0–200)
HDL: 44.7 mg/dL (ref >39.00)
LDL Cholesterol: 91 mg/dL (ref 0–99)
NonHDL: 108.9
Total CHOL/HDL Ratio: 3
Triglycerides: 90 mg/dL (ref 0.0–149.0)
VLDL: 18 mg/dL (ref 0.0–40.0)

## 2018-10-04 LAB — TSH: TSH: 4.6 u[IU]/mL — ABNORMAL HIGH (ref 0.35–4.50)

## 2018-10-17 ENCOUNTER — Ambulatory Visit (INDEPENDENT_AMBULATORY_CARE_PROVIDER_SITE_OTHER): Payer: Medicare Other | Admitting: *Deleted

## 2018-10-17 DIAGNOSIS — I495 Sick sinus syndrome: Secondary | ICD-10-CM

## 2018-10-17 LAB — CUP PACEART REMOTE DEVICE CHECK
Battery Remaining Longevity: 79 mo
Battery Voltage: 3.01 V
Brady Statistic AP VP Percent: 0 %
Brady Statistic AP VS Percent: 97.52 %
Brady Statistic AS VP Percent: 0 %
Brady Statistic AS VS Percent: 2.47 %
Brady Statistic RA Percent Paced: 97.51 %
Brady Statistic RV Percent Paced: 0 %
Date Time Interrogation Session: 20200616125116
Implantable Lead Implant Date: 20170710
Implantable Lead Implant Date: 20170710
Implantable Lead Location: 753859
Implantable Lead Location: 753860
Implantable Lead Model: 3830
Implantable Lead Model: 5076
Implantable Pulse Generator Implant Date: 20170710
Lead Channel Impedance Value: 304 Ohm
Lead Channel Impedance Value: 380 Ohm
Lead Channel Impedance Value: 437 Ohm
Lead Channel Impedance Value: 456 Ohm
Lead Channel Pacing Threshold Amplitude: 0.5 V
Lead Channel Pacing Threshold Amplitude: 0.75 V
Lead Channel Pacing Threshold Pulse Width: 0.4 ms
Lead Channel Pacing Threshold Pulse Width: 0.4 ms
Lead Channel Sensing Intrinsic Amplitude: 2.5 mV
Lead Channel Sensing Intrinsic Amplitude: 2.5 mV
Lead Channel Sensing Intrinsic Amplitude: 6.75 mV
Lead Channel Sensing Intrinsic Amplitude: 6.75 mV
Lead Channel Setting Pacing Amplitude: 1.5 V
Lead Channel Setting Pacing Amplitude: 3.5 V
Lead Channel Setting Pacing Pulse Width: 1 ms
Lead Channel Setting Sensing Sensitivity: 0.9 mV

## 2018-10-23 ENCOUNTER — Other Ambulatory Visit: Payer: Self-pay | Admitting: Internal Medicine

## 2018-10-27 ENCOUNTER — Encounter: Payer: Self-pay | Admitting: Cardiology

## 2018-10-27 NOTE — Progress Notes (Signed)
Remote pacemaker transmission.   

## 2018-11-09 ENCOUNTER — Telehealth: Payer: Self-pay

## 2018-11-09 ENCOUNTER — Other Ambulatory Visit: Payer: Self-pay | Admitting: Internal Medicine

## 2018-11-09 NOTE — Telephone Encounter (Signed)

## 2018-11-16 ENCOUNTER — Ambulatory Visit (INDEPENDENT_AMBULATORY_CARE_PROVIDER_SITE_OTHER): Payer: Medicare Other | Admitting: *Deleted

## 2018-11-16 ENCOUNTER — Other Ambulatory Visit: Payer: Self-pay

## 2018-11-16 DIAGNOSIS — I4892 Unspecified atrial flutter: Secondary | ICD-10-CM

## 2018-11-16 DIAGNOSIS — Z5181 Encounter for therapeutic drug level monitoring: Secondary | ICD-10-CM

## 2018-11-16 LAB — POCT INR: INR: 3.4 — AB (ref 2.0–3.0)

## 2018-11-16 NOTE — Patient Instructions (Signed)
Description    Hold tonight's dose of coumadin, then continue same dosage 2 tablets everyday except 1.5 tablets on Mondays and Fridays. Recheck INR in 3 weeks. Call with any changes or new medications [phone]

## 2018-11-22 DIAGNOSIS — R350 Frequency of micturition: Secondary | ICD-10-CM | POA: Diagnosis not present

## 2018-11-22 DIAGNOSIS — N401 Enlarged prostate with lower urinary tract symptoms: Secondary | ICD-10-CM | POA: Diagnosis not present

## 2018-11-28 ENCOUNTER — Telehealth: Payer: Self-pay

## 2018-11-28 NOTE — Telephone Encounter (Signed)
Pt has questions about his pulse rates. His pulse rate was at 65, 66. I had the pt to send a transmission with his home monitor. I told him when I will have the nurse take a look at it and give him a call back.

## 2018-11-28 NOTE — Telephone Encounter (Signed)
Spoke with pt wife and she asked me to call him back at 3:30pm

## 2018-11-28 NOTE — Telephone Encounter (Signed)
I called the pt to let him know I did not received his transmission. His wife states the pt is very upset and been waiting for the call. I told her I been checking [facility] for the transmission and we did not receive it. I told her that is why I am calling him back and I am very sorry that he is upset. I told her I will call him at 2:30-3 pm to help send the transmission. She thanked me for the call.

## 2018-11-28 NOTE — Telephone Encounter (Signed)
Pt reports his HR has consistently been reading around 85bpm since his PPM was implanted. Today he noticed readings of 65 and 66bpm and was very concerned that something was wrong. No cardiac symptoms.  Reassured pt that device function is normal. No episodes, lead trends stable. Histograms appropriate, base rate programmed at 60bpm with rate response on. Presenting rhythm Ap/Vs @ 88bpm. Explained that as his sensor is enabled, his HR will increase with activity. Explained normal HR ranges for pt's programming. He verbalizes understanding and reports this is very reassuring. No further questions or concerns at this time. Direct DC number given for any future questions.

## 2018-12-07 ENCOUNTER — Ambulatory Visit (INDEPENDENT_AMBULATORY_CARE_PROVIDER_SITE_OTHER): Payer: Medicare Other | Admitting: *Deleted

## 2018-12-07 ENCOUNTER — Other Ambulatory Visit: Payer: Self-pay

## 2018-12-07 DIAGNOSIS — Z5181 Encounter for therapeutic drug level monitoring: Secondary | ICD-10-CM

## 2018-12-07 DIAGNOSIS — I4892 Unspecified atrial flutter: Secondary | ICD-10-CM

## 2018-12-07 LAB — POCT INR: INR: 3 (ref 2.0–3.0)

## 2018-12-07 NOTE — Patient Instructions (Addendum)
Description   Continue same dosage 2 tablets everyday except 1.5 tablets on Mondays and Fridays. Recheck INR in 4 weeks. Call with any changes or new medications [phone]- [phone]

## 2018-12-29 DIAGNOSIS — Z23 Encounter for immunization: Secondary | ICD-10-CM | POA: Diagnosis not present

## 2019-01-04 ENCOUNTER — Other Ambulatory Visit: Payer: Self-pay

## 2019-01-04 ENCOUNTER — Ambulatory Visit (INDEPENDENT_AMBULATORY_CARE_PROVIDER_SITE_OTHER): Payer: Medicare Other | Admitting: *Deleted

## 2019-01-04 DIAGNOSIS — I4892 Unspecified atrial flutter: Secondary | ICD-10-CM | POA: Diagnosis not present

## 2019-01-04 DIAGNOSIS — Z5181 Encounter for therapeutic drug level monitoring: Secondary | ICD-10-CM | POA: Diagnosis not present

## 2019-01-04 LAB — POCT INR: INR: 2.3 (ref 2.0–3.0)

## 2019-01-04 NOTE — Patient Instructions (Signed)
Description   Continue same dosage 2 tablets everyday except 1.5 tablets on Mondays and Fridays. Recheck INR in 5 weeks. Call with any changes or new medications [phone]

## 2019-01-16 ENCOUNTER — Ambulatory Visit (INDEPENDENT_AMBULATORY_CARE_PROVIDER_SITE_OTHER): Payer: Medicare Other | Admitting: *Deleted

## 2019-01-16 DIAGNOSIS — I4891 Unspecified atrial fibrillation: Secondary | ICD-10-CM

## 2019-01-16 DIAGNOSIS — I495 Sick sinus syndrome: Secondary | ICD-10-CM

## 2019-01-16 LAB — CUP PACEART REMOTE DEVICE CHECK
Battery Remaining Longevity: 77 mo
Battery Voltage: 3.01 V
Brady Statistic AP VP Percent: 0 %
Brady Statistic AP VS Percent: 97.68 %
Brady Statistic AS VP Percent: 0 %
Brady Statistic AS VS Percent: 2.32 %
Brady Statistic RA Percent Paced: 97.67 %
Brady Statistic RV Percent Paced: 0 %
Date Time Interrogation Session: 20200915160010
Implantable Lead Implant Date: 20170710
Implantable Lead Implant Date: 20170710
Implantable Lead Location: 753859
Implantable Lead Location: 753860
Implantable Lead Model: 3830
Implantable Lead Model: 5076
Implantable Pulse Generator Implant Date: 20170710
Lead Channel Impedance Value: 304 Ohm
Lead Channel Impedance Value: 380 Ohm
Lead Channel Impedance Value: 437 Ohm
Lead Channel Impedance Value: 456 Ohm
Lead Channel Pacing Threshold Amplitude: 0.5 V
Lead Channel Pacing Threshold Amplitude: 0.625 V
Lead Channel Pacing Threshold Pulse Width: 0.4 ms
Lead Channel Pacing Threshold Pulse Width: 0.4 ms
Lead Channel Sensing Intrinsic Amplitude: 2.375 mV
Lead Channel Sensing Intrinsic Amplitude: 2.375 mV
Lead Channel Sensing Intrinsic Amplitude: 7.125 mV
Lead Channel Sensing Intrinsic Amplitude: 7.125 mV
Lead Channel Setting Pacing Amplitude: 1.5 V
Lead Channel Setting Pacing Amplitude: 3.5 V
Lead Channel Setting Pacing Pulse Width: 1 ms
Lead Channel Setting Sensing Sensitivity: 0.9 mV

## 2019-01-23 ENCOUNTER — Other Ambulatory Visit: Payer: Self-pay | Admitting: Internal Medicine

## 2019-01-23 MED ORDER — METOPROLOL SUCCINATE ER 50 MG PO TB24: ORAL_TABLET | ORAL | 2 refills | Status: DC

## 2019-01-23 NOTE — Progress Notes (Signed)
Remote pacemaker transmission.   

## 2019-02-08 ENCOUNTER — Ambulatory Visit (INDEPENDENT_AMBULATORY_CARE_PROVIDER_SITE_OTHER): Payer: Medicare Other

## 2019-02-08 ENCOUNTER — Other Ambulatory Visit: Payer: Self-pay

## 2019-02-08 DIAGNOSIS — I4892 Unspecified atrial flutter: Secondary | ICD-10-CM | POA: Diagnosis not present

## 2019-02-08 DIAGNOSIS — Z5181 Encounter for therapeutic drug level monitoring: Secondary | ICD-10-CM

## 2019-02-08 LAB — POCT INR: INR: 2.7 (ref 2.0–3.0)

## 2019-02-08 NOTE — Patient Instructions (Signed)
Description   Continue same dosage 2 tablets everyday except 1.5 tablets on Mondays and Fridays. Recheck INR in 6 weeks. Call with any changes or new medications [phone]

## 2019-03-21 DIAGNOSIS — H02102 Unspecified ectropion of right lower eyelid: Secondary | ICD-10-CM | POA: Diagnosis not present

## 2019-03-21 DIAGNOSIS — Z961 Presence of intraocular lens: Secondary | ICD-10-CM | POA: Diagnosis not present

## 2019-03-21 DIAGNOSIS — H04123 Dry eye syndrome of bilateral lacrimal glands: Secondary | ICD-10-CM | POA: Diagnosis not present

## 2019-03-21 DIAGNOSIS — H02101 Unspecified ectropion of right upper eyelid: Secondary | ICD-10-CM | POA: Diagnosis not present

## 2019-03-21 DIAGNOSIS — H524 Presbyopia: Secondary | ICD-10-CM | POA: Diagnosis not present

## 2019-03-22 ENCOUNTER — Ambulatory Visit (INDEPENDENT_AMBULATORY_CARE_PROVIDER_SITE_OTHER): Payer: Medicare Other | Admitting: *Deleted

## 2019-03-22 ENCOUNTER — Other Ambulatory Visit: Payer: Self-pay

## 2019-03-22 DIAGNOSIS — Z5181 Encounter for therapeutic drug level monitoring: Secondary | ICD-10-CM | POA: Diagnosis not present

## 2019-03-22 DIAGNOSIS — I4892 Unspecified atrial flutter: Secondary | ICD-10-CM

## 2019-03-22 LAB — POCT INR: INR: 2 (ref 2.0–3.0)

## 2019-03-22 NOTE — Patient Instructions (Signed)
Description   Today take 2.5 tablets then continue same dosage 2 tablets everyday except 1.5 tablets on Mondays and Fridays. Recheck INR in 6 weeks. Call with any changes or new medications [phone]- [phone]

## 2019-03-23 ENCOUNTER — Other Ambulatory Visit: Payer: Self-pay

## 2019-04-17 ENCOUNTER — Ambulatory Visit (INDEPENDENT_AMBULATORY_CARE_PROVIDER_SITE_OTHER): Payer: Medicare Other | Admitting: *Deleted

## 2019-04-17 DIAGNOSIS — I495 Sick sinus syndrome: Secondary | ICD-10-CM | POA: Diagnosis not present

## 2019-04-17 LAB — CUP PACEART REMOTE DEVICE CHECK
Battery Remaining Longevity: 76 mo
Battery Voltage: 3.01 V
Brady Statistic AP VP Percent: 0.01 %
Brady Statistic AP VS Percent: 96.15 %
Brady Statistic AS VP Percent: 0 %
Brady Statistic AS VS Percent: 3.84 %
Brady Statistic RA Percent Paced: 96.15 %
Brady Statistic RV Percent Paced: 0.01 %
Date Time Interrogation Session: 20201215130141
Implantable Lead Implant Date: 20170710
Implantable Lead Implant Date: 20170710
Implantable Lead Location: 753859
Implantable Lead Location: 753860
Implantable Lead Model: 3830
Implantable Lead Model: 5076
Implantable Pulse Generator Implant Date: 20170710
Lead Channel Impedance Value: 285 Ohm
Lead Channel Impedance Value: 361 Ohm
Lead Channel Impedance Value: 437 Ohm
Lead Channel Impedance Value: 437 Ohm
Lead Channel Pacing Threshold Amplitude: 0.5 V
Lead Channel Pacing Threshold Amplitude: 0.625 V
Lead Channel Pacing Threshold Pulse Width: 0.4 ms
Lead Channel Pacing Threshold Pulse Width: 0.4 ms
Lead Channel Sensing Intrinsic Amplitude: 2.25 mV
Lead Channel Sensing Intrinsic Amplitude: 2.25 mV
Lead Channel Sensing Intrinsic Amplitude: 6.75 mV
Lead Channel Sensing Intrinsic Amplitude: 6.75 mV
Lead Channel Setting Pacing Amplitude: 1.5 V
Lead Channel Setting Pacing Amplitude: 3.5 V
Lead Channel Setting Pacing Pulse Width: 1 ms
Lead Channel Setting Sensing Sensitivity: 0.9 mV

## 2019-04-23 ENCOUNTER — Other Ambulatory Visit: Payer: Self-pay | Admitting: *Deleted

## 2019-04-23 MED ORDER — WARFARIN SODIUM 2 MG PO TABS: ORAL_TABLET | ORAL | 0 refills | Status: DC

## 2019-05-02 ENCOUNTER — Encounter: Payer: Self-pay | Admitting: Family Medicine

## 2019-05-03 ENCOUNTER — Ambulatory Visit (INDEPENDENT_AMBULATORY_CARE_PROVIDER_SITE_OTHER): Payer: Medicare Other | Admitting: *Deleted

## 2019-05-03 ENCOUNTER — Other Ambulatory Visit: Payer: Self-pay

## 2019-05-03 DIAGNOSIS — I4892 Unspecified atrial flutter: Secondary | ICD-10-CM | POA: Diagnosis not present

## 2019-05-03 DIAGNOSIS — Z5181 Encounter for therapeutic drug level monitoring: Secondary | ICD-10-CM | POA: Diagnosis not present

## 2019-05-03 LAB — POCT INR: INR: 2.2 (ref 2.0–3.0)

## 2019-05-03 NOTE — Patient Instructions (Signed)
Description   Continue same dosage 2 tablets everyday except 1.5 tablets on Mondays and Fridays. Recheck INR in 6 weeks. Call with any changes or new medications [phone]

## 2019-05-06 ENCOUNTER — Emergency Department (HOSPITAL_COMMUNITY): Payer: Medicare Other

## 2019-05-06 ENCOUNTER — Other Ambulatory Visit: Payer: Self-pay

## 2019-05-06 ENCOUNTER — Inpatient Hospital Stay (HOSPITAL_COMMUNITY)
Admission: EM | Admit: 2019-05-06 | Discharge: 2019-05-07 | DRG: 083 | Disposition: A | Discharge status: Home / Self Care | Payer: Medicare Other | Attending: Osteopathic Medicine | Admitting: Osteopathic Medicine

## 2019-05-06 ENCOUNTER — Encounter (HOSPITAL_COMMUNITY): Payer: Self-pay | Admitting: Emergency Medicine

## 2019-05-06 DIAGNOSIS — S5291XA Unspecified fracture of right forearm, initial encounter for closed fracture: Secondary | ICD-10-CM

## 2019-05-06 DIAGNOSIS — I10 Essential (primary) hypertension: Secondary | ICD-10-CM | POA: Diagnosis not present

## 2019-05-06 DIAGNOSIS — I480 Paroxysmal atrial fibrillation: Secondary | ICD-10-CM | POA: Diagnosis present

## 2019-05-06 DIAGNOSIS — S06360A Traumatic hemorrhage of cerebrum, unspecified, without loss of consciousness, initial encounter: Secondary | ICD-10-CM | POA: Diagnosis not present

## 2019-05-06 DIAGNOSIS — I5022 Chronic systolic (congestive) heart failure: Secondary | ICD-10-CM | POA: Diagnosis present

## 2019-05-06 DIAGNOSIS — Z20822 Contact with and (suspected) exposure to covid-19: Secondary | ICD-10-CM | POA: Diagnosis present

## 2019-05-06 DIAGNOSIS — Z950 Presence of cardiac pacemaker: Secondary | ICD-10-CM | POA: Diagnosis not present

## 2019-05-06 DIAGNOSIS — M353 Polymyalgia rheumatica: Secondary | ICD-10-CM | POA: Diagnosis present

## 2019-05-06 DIAGNOSIS — I629 Nontraumatic intracranial hemorrhage, unspecified: Secondary | ICD-10-CM | POA: Diagnosis not present

## 2019-05-06 DIAGNOSIS — Y92007 Garden or yard of unspecified non-institutional (private) residence as the place of occurrence of the external cause: Secondary | ICD-10-CM

## 2019-05-06 DIAGNOSIS — Z79899 Other long term (current) drug therapy: Secondary | ICD-10-CM

## 2019-05-06 DIAGNOSIS — S99912A Unspecified injury of left ankle, initial encounter: Secondary | ICD-10-CM | POA: Diagnosis not present

## 2019-05-06 DIAGNOSIS — Z87891 Personal history of nicotine dependence: Secondary | ICD-10-CM | POA: Diagnosis not present

## 2019-05-06 DIAGNOSIS — Z03818 Encounter for observation for suspected exposure to other biological agents ruled out: Secondary | ICD-10-CM | POA: Diagnosis not present

## 2019-05-06 DIAGNOSIS — Z841 Family history of disorders of kidney and ureter: Secondary | ICD-10-CM | POA: Diagnosis not present

## 2019-05-06 DIAGNOSIS — S06349A Traumatic hemorrhage of right cerebrum with loss of consciousness of unspecified duration, initial encounter: Principal | ICD-10-CM | POA: Diagnosis present

## 2019-05-06 DIAGNOSIS — S3993XA Unspecified injury of pelvis, initial encounter: Secondary | ICD-10-CM | POA: Diagnosis not present

## 2019-05-06 DIAGNOSIS — I428 Other cardiomyopathies: Secondary | ICD-10-CM | POA: Diagnosis present

## 2019-05-06 DIAGNOSIS — E785 Hyperlipidemia, unspecified: Secondary | ICD-10-CM | POA: Diagnosis present

## 2019-05-06 DIAGNOSIS — S52501A Unspecified fracture of the lower end of right radius, initial encounter for closed fracture: Secondary | ICD-10-CM | POA: Diagnosis not present

## 2019-05-06 DIAGNOSIS — W108XXA Fall (on) (from) other stairs and steps, initial encounter: Secondary | ICD-10-CM | POA: Diagnosis present

## 2019-05-06 DIAGNOSIS — S52571A Other intraarticular fracture of lower end of right radius, initial encounter for closed fracture: Secondary | ICD-10-CM | POA: Diagnosis present

## 2019-05-06 DIAGNOSIS — I130 Hypertensive heart and chronic kidney disease with heart failure and stage 1 through stage 4 chronic kidney disease, or unspecified chronic kidney disease: Secondary | ICD-10-CM | POA: Diagnosis present

## 2019-05-06 DIAGNOSIS — N400 Enlarged prostate without lower urinary tract symptoms: Secondary | ICD-10-CM | POA: Diagnosis present

## 2019-05-06 DIAGNOSIS — M1990 Unspecified osteoarthritis, unspecified site: Secondary | ICD-10-CM | POA: Diagnosis present

## 2019-05-06 DIAGNOSIS — Z7901 Long term (current) use of anticoagulants: Secondary | ICD-10-CM

## 2019-05-06 DIAGNOSIS — M25571 Pain in right ankle and joints of right foot: Secondary | ICD-10-CM | POA: Diagnosis present

## 2019-05-06 DIAGNOSIS — S199XXA Unspecified injury of neck, initial encounter: Secondary | ICD-10-CM | POA: Diagnosis not present

## 2019-05-06 DIAGNOSIS — E039 Hypothyroidism, unspecified: Secondary | ICD-10-CM | POA: Diagnosis present

## 2019-05-06 DIAGNOSIS — Z8603 Personal history of neoplasm of uncertain behavior: Secondary | ICD-10-CM | POA: Diagnosis not present

## 2019-05-06 DIAGNOSIS — Z23 Encounter for immunization: Secondary | ICD-10-CM | POA: Diagnosis not present

## 2019-05-06 DIAGNOSIS — S06311A Contusion and laceration of right cerebrum with loss of consciousness of 30 minutes or less, initial encounter: Secondary | ICD-10-CM | POA: Diagnosis not present

## 2019-05-06 DIAGNOSIS — N183 Chronic kidney disease, stage 3 unspecified: Secondary | ICD-10-CM | POA: Diagnosis present

## 2019-05-06 DIAGNOSIS — M25572 Pain in left ankle and joints of left foot: Secondary | ICD-10-CM | POA: Diagnosis not present

## 2019-05-06 DIAGNOSIS — S62101A Fracture of unspecified carpal bone, right wrist, initial encounter for closed fracture: Secondary | ICD-10-CM | POA: Diagnosis present

## 2019-05-06 DIAGNOSIS — M25559 Pain in unspecified hip: Secondary | ICD-10-CM | POA: Diagnosis not present

## 2019-05-06 DIAGNOSIS — S06369A Traumatic hemorrhage of cerebrum, unspecified, with loss of consciousness of unspecified duration, initial encounter: Secondary | ICD-10-CM | POA: Diagnosis not present

## 2019-05-06 DIAGNOSIS — Z79890 Hormone replacement therapy (postmenopausal): Secondary | ICD-10-CM | POA: Diagnosis not present

## 2019-05-06 DIAGNOSIS — W19XXXA Unspecified fall, initial encounter: Secondary | ICD-10-CM

## 2019-05-06 DIAGNOSIS — R102 Pelvic and perineal pain: Secondary | ICD-10-CM | POA: Diagnosis not present

## 2019-05-06 DIAGNOSIS — S299XXA Unspecified injury of thorax, initial encounter: Secondary | ICD-10-CM | POA: Diagnosis not present

## 2019-05-06 LAB — BASIC METABOLIC PANEL
Anion gap: 8 (ref 5–15)
BUN: 21 mg/dL (ref 8–23)
CO2: 29 mmol/L (ref 22–32)
Calcium: 9.2 mg/dL (ref 8.9–10.3)
Chloride: 102 mmol/L (ref 98–111)
Creatinine, Ser: 1.04 mg/dL (ref 0.61–1.24)
GFR calc Af Amer: >60 mL/min (ref >60)
GFR calc non Af Amer: >60 mL/min (ref >60)
Glucose, Bld: 139 mg/dL — ABNORMAL HIGH (ref 70–99)
Potassium: 4.7 mmol/L (ref 3.5–5.1)
Sodium: 139 mmol/L (ref 135–145)

## 2019-05-06 LAB — CBC WITH DIFFERENTIAL/PLATELET
Abs Immature Granulocytes: 0.03 10*3/uL (ref 0.00–0.07)
Basophils Absolute: 0 10*3/uL (ref 0.0–0.1)
Basophils Relative: 0 %
Eosinophils Absolute: 0 10*3/uL (ref 0.0–0.5)
Eosinophils Relative: 0 %
HCT: 42.6 % (ref 39.0–52.0)
Hemoglobin: 13.6 g/dL (ref 13.0–17.0)
Immature Granulocytes: 0 %
Lymphocytes Relative: 8 %
Lymphs Abs: 0.6 10*3/uL — ABNORMAL LOW (ref 0.7–4.0)
MCH: 30.8 pg (ref 26.0–34.0)
MCHC: 31.9 g/dL (ref 30.0–36.0)
MCV: 96.4 fL (ref 80.0–100.0)
Monocytes Absolute: 0.6 10*3/uL (ref 0.1–1.0)
Monocytes Relative: 8 %
Neutro Abs: 6.5 10*3/uL (ref 1.7–7.7)
Neutrophils Relative %: 84 %
Platelets: 155 10*3/uL (ref 150–400)
RBC: 4.42 MIL/uL (ref 4.22–5.81)
RDW: 13.4 % (ref 11.5–15.5)
WBC: 7.8 10*3/uL (ref 4.0–10.5)
nRBC: 0 % (ref 0.0–0.2)

## 2019-05-06 LAB — ABO/RH: ABO/RH(D): A POS

## 2019-05-06 LAB — TYPE AND SCREEN
ABO/RH(D): A POS
Antibody Screen: NEGATIVE

## 2019-05-06 LAB — PROTIME-INR
INR: 1.8 — ABNORMAL HIGH (ref 0.8–1.2)
INR: 1.2 (ref 0.8–1.2)
Prothrombin Time: 20.5 seconds — ABNORMAL HIGH (ref 11.4–15.2)
Prothrombin Time: 14.8 seconds (ref 11.4–15.2)

## 2019-05-06 MED ORDER — SODIUM CHLORIDE 0.9 % IV SOLN: INTRAVENOUS | Status: DC

## 2019-05-06 MED ORDER — VITAMIN K1 10 MG/ML IJ SOLN
10.0000 mg | INTRAVENOUS | Status: AC
  Administered 2019-05-06: 10 mg via INTRAVENOUS
  Filled 2019-05-06: qty 1

## 2019-05-06 MED ORDER — ONDANSETRON HCL 4 MG/2ML IJ SOLN
4.0000 mg | Freq: Four times a day (QID) | INTRAMUSCULAR | Status: DC | PRN
  Administered 2019-05-07: 4 mg via INTRAVENOUS
  Filled 2019-05-06: qty 2

## 2019-05-06 MED ORDER — ONDANSETRON HCL 4 MG PO TABS: 4.0000 mg | ORAL_TABLET | Freq: Four times a day (QID) | ORAL | Status: DC | PRN

## 2019-05-06 MED ORDER — TETANUS-DIPHTH-ACELL PERTUSSIS 5-2.5-18.5 LF-MCG/0.5 IM SUSP
0.5000 mL | Freq: Once | INTRAMUSCULAR | Status: AC
  Administered 2019-05-06: 0.5 mL via INTRAMUSCULAR
  Filled 2019-05-06: qty 0.5

## 2019-05-06 MED ORDER — MORPHINE SULFATE (PF) 2 MG/ML IV SOLN
2.0000 mg | INTRAVENOUS | Status: DC | PRN
  Administered 2019-05-06 – 2019-05-07 (×3): 2 mg via INTRAVENOUS
  Filled 2019-05-06 (×3): qty 1

## 2019-05-06 MED ORDER — PROTHROMBIN COMPLEX CONC HUMAN 500 UNITS IV KIT
1580.0000 [IU] | PACK | Status: AC
  Administered 2019-05-06: 1580 [IU] via INTRAVENOUS
  Filled 2019-05-06: qty 1080

## 2019-05-06 NOTE — ED Notes (Signed)
Paged Dr. Jonelle Sidle requesting call back concerning need for PRN pain medication

## 2019-05-06 NOTE — ED Notes (Signed)
[name] (Son#[phone]) called for an update.  Thank you

## 2019-05-06 NOTE — Progress Notes (Signed)
Orthopedic Tech Progress Note Patient Details:  [name] 19-Apr-1926 Ecru:[id number]  Ortho Devices Type of Ortho Device: Arm sling, Ace wrap, Short arm splint Ortho Device/Splint Location: sugartone splint Ortho Device/Splint Interventions: Application   Post Interventions Patient Tolerated: Well Instructions Provided: Care of device   [place] Pink 05/06/2019, 5:25 PM

## 2019-05-06 NOTE — ED Provider Notes (Signed)
[name] EMERGENCY DEPARTMENT Provider Note   CSN: [id number] Arrival date & time: 05/06/19  1147     History Chief Complaint  Patient presents with  . Fall    [name] is a 86 y.o. male.  Patient here after a fall.  Does not recall what happened.  His son reports he was in the front yard on the front porch and somehow fell down a couple steps onto a brick patio.  Patient does not member feeling dizzy or lightheaded.  Son believes he may have stumbled in his shoes on the wet pavement.  He did hit his head and right wrist.  Does take Coumadin for history of atrial fibrillation and has a pacemaker.  Denies losing consciousness.  No vomiting.  No chest pain or shortness of breath.  Complains of pain to his right face, right wrist, left ankle.  Denies preceding dizziness or lightheadedness.  No chest pain or shortness of breath.  No focal weakness, numbness or tingling.  Patient does not think that he passed out but does not remember the fall.  Most of his pain is in his right wrist.  Discussed with patient and son [name] by phone.  He did not see him fall but believes he may have lost his balance on the wet pavement.  The history is provided by the patient and a relative.  Fall Pertinent negatives include no abdominal pain, no headaches and no shortness of breath.       Past Medical History:  Diagnosis Date  . AF (atrial fibrillation) [facility])    a. s/p RFCA/PVI @ [facility] clinic 2007.  . Arthritis    "joints" (06/21/2013)  . Atrial flutter ([place])    a. 06/2013 s/p TEE/DCCV  . Chronic systolic CHF (congestive heart failure) ([place])    a. 06/2013 Echo: EF 20%, diff HK, mild LVH.  Marland Kitchen History of cardiovascular stress test    Lexiscan Myoview 8/16:  EF 59%, attenuation artifact, no ischemia; Low Risk  . Hyperlipidemia   . HYPERTENSION, BENIGN 08/21/2009   Qualifier: Diagnosis of  By: Caryl Comes, MD, Remus Blake  Medications(s)  enalapril   . Hypothyroidism   .  Neoplasm of uncertain behavior    SKIN  . Polymyalgia rheumatica ([place]) 01/13/2007   Qualifier: History of  By: Danny Lawless CMA, [place]       Patient Active Problem List   Diagnosis Date Noted  . BPH (benign prostatic hyperplasia) 10/20/2016  . Sinus node dysfunction ([place]) 11/10/2015  . Atrial fibrillation ([place]) 10/03/2015  . Atrial fibrillation with RVR ([place]) 10/03/2015  . Polycythemia 07/25/2015  . NICM (nonischemic cardiomyopathy) ([place]) 07/04/2015  . Chronic anticoagulation 07/04/2015  . CKD (chronic kidney disease) stage 3, GFR 30-59 ml/min 01/24/2015  . Atypical atrial flutter ([place])   . Encounter for therapeutic drug monitoring 06/26/2013  . Atrial flutter ([place]) 06/21/2013    Class: Acute  . Acute systolic heart failure ([place]) 06/21/2013  . Hearing loss 06/04/2013  . Routine adult health maintenance 07/30/2012  . Palpitation 07/18/2012  . Stress at home 07/18/2012  . Urinary urgency 03/12/2012  . Hypogonadism male 01/16/2010  . HYPERTENSION, BENIGN 08/21/2009    Class: Chronic  . PAF (paroxysmal atrial fibrillation) ([place]) 01/03/2008  . NEOPLASM OF UNCERTAIN BEHAVIOR OF SKIN 04/05/2007  . Hypothyroidism 01/13/2007  . Hyperlipidemia 01/13/2007  . Polymyalgia rheumatica ([place]) 01/13/2007  . INGUINAL HERNIORRHAPHY, HX OF 01/13/2007    Past Surgical History:  Procedure Laterality Date  . ATRIAL FIBRILLATION  ABLATION  2007  . CARDIOVERSION N/A 06/22/2013   Procedure: CARDIOVERSION;  Surgeon: Sueanne Margarita, MD;  Location: [facility] ENDOSCOPY;  Service: Cardiovascular;  Laterality: N/A;  15:17 Lido 20mg ,IV , Propofol 30 mg, IV synched cardioversion @ 150 joules by Dr. Lajoyce Lauber from a flutter to sinis brady which is baseline for this pt, reports 45-50 reg HR verified by Dr. Ashok Norris  . CARDIOVERSION N/A 11/21/2014   Procedure: CARDIOVERSION;  Surgeon: Fay Records, MD;  Location: [facility] ENDOSCOPY;  Service: Cardiovascular;  Laterality: N/A;  . CARDIOVERSION N/A 07/14/2015   Procedure:  CARDIOVERSION;  Surgeon: Sueanne Margarita, MD;  Location: [place];  Service: Cardiovascular;  Laterality: N/A;  . CATARACT EXTRACTION, BILATERAL Bilateral Summer 2009  . CHOLECYSTECTOMY    . EP IMPLANTABLE DEVICE N/A 11/10/2015   Procedure: Pacemaker Implant;  Surgeon: [name], MD;  Location: [facility] CV LAB;  Service: Cardiovascular;  Laterality: N/A;  . HAND SURGERY Right    "broke bone; grafted area w/bone from somewhere else"  . HEMORRHOID SURGERY    . INGUINAL HERNIA REPAIR Bilateral   . TEE WITHOUT CARDIOVERSION N/A 06/22/2013   Procedure: TRANSESOPHAGEAL ECHOCARDIOGRAM (TEE);  Surgeon: Sueanne Margarita, MD;  Location: [facility] ENDOSCOPY;  Service: Cardiovascular;  Laterality: N/A;  . TEE WITHOUT CARDIOVERSION N/A 07/14/2015   Procedure: TRANSESOPHAGEAL ECHOCARDIOGRAM (TEE);  Surgeon: Sueanne Margarita, MD;  Location: [facility] ENDOSCOPY;  Service: Cardiovascular;  Laterality: N/A;  . TONSILLECTOMY         Family History  Problem Relation Age of Onset  . Emphysema Mother   . Diabetes Mother   . Kidney disease Father     Social History   Tobacco Use  . Smoking status: Former Smoker    Packs/day: 1.00    Years: 12.00    Pack years: 12.00    Types: Cigarettes    Quit date: 05/04/1963    Years since quitting: 56.0  . Smokeless tobacco: Never Used  . Tobacco comment: quit cigars 82'   Substance Use Topics  . Alcohol use: Yes    Comment: 06/21/2013 "I was a moderate drinker at most; rarely have drink now"  . Drug use: No    Home Medications Prior to Admission medications   Medication Sig Start Date End Date Taking? Authorizing Provider  amLODipine (NORVASC) 5 MG tablet Take 1 tablet (5 mg total) by mouth daily. 08/07/18   Burchette, Alinda Sierras, MD  atorvastatin (LIPITOR) 10 MG tablet Take 1 tablet (10 mg total) by mouth daily. 08/07/18   Burchette, Alinda Sierras, MD  cholecalciferol (VITAMIN D) 1000 UNITS tablet Take 1,000 Units by mouth every morning.    [provider]  furosemide  (LASIX) 20 MG tablet TAKE 1 TABLET BY MOUTH EVERY DAY Patient taking differently: Take 20 mg by mouth daily.  09/26/18   Burchette, Alinda Sierras, MD  Glucosamine-Chondroitin 500-400 MG CAPS Take 1 capsule by mouth every morning.    [provider]  levothyroxine (SYNTHROID, LEVOTHROID) 50 MCG tablet Take 1 tablet (50 mcg total) by mouth daily. 08/07/18   Burchette, Alinda Sierras, MD  metoprolol succinate (TOPROL-XL) 50 MG 24 hr tablet TAKE 1 TABLET BY MOUTH DAILY WITH OR IMMEDIATELY FOLLOWING A MEAL Patient taking differently: Take 50 mg by mouth daily.  01/23/19   [name], MD  Multiple Vitamins-Minerals (CENTRUM SILVER PO) Take 1 tablet by mouth every morning.     [provider]  warfarin (COUMADIN) 2 MG tablet Take as directed by the  coumadin clinic 04/23/19   [name], MD    Allergies    Patient has no known allergies.  Review of Systems   Review of Systems  Constitutional: Negative for activity change, appetite change and fever.  HENT: Negative for congestion and rhinorrhea.   Eyes: Negative for visual disturbance.  Respiratory: Negative for chest tightness and shortness of breath.   Gastrointestinal: Negative for abdominal pain, nausea and vomiting.  Musculoskeletal: Positive for arthralgias and myalgias.  Skin: Positive for wound.  Neurological: Negative for dizziness, weakness and headaches.    all other systems are negative except as noted in the HPI and PMH.   Physical Exam Updated Vital Signs BP (!) 181/85 (BP Location: Right Arm)   Pulse 62   Temp 97.7 F (36.5 C) (Oral)   Resp 16   Physical Exam Vitals and nursing note reviewed.  Constitutional:      General: He is not in acute distress.    Appearance: Normal appearance. He is well-developed and normal weight.  HENT:     Head: Normocephalic and atraumatic.     Comments: Abrasions right temple and right face.  No septal hematoma or hemotympanum    Mouth/Throat:     Pharynx: No oropharyngeal  exudate.  Eyes:     Conjunctiva/sclera: Conjunctivae normal.     Pupils: Pupils are equal, round, and reactive to light.  Neck:     Comments: No midline C-spine tenderness Cardiovascular:     Rate and Rhythm: Normal rate and regular rhythm.     Heart sounds: Normal heart sounds. No murmur.  Pulmonary:     Effort: Pulmonary effort is normal. No respiratory distress.     Breath sounds: Normal breath sounds.  Abdominal:     Palpations: Abdomen is soft.     Tenderness: There is no abdominal tenderness. There is no guarding or rebound.  Musculoskeletal:        General: No tenderness. Normal range of motion.     Cervical back: Normal range of motion and neck supple.     Comments: FROM hips without pain bilaterally.  Swelling and abrasions to right wrist and right hands.  Abrasions to fingertips of right middle finger intact radial pulse. Cardinal hand movements intact.  Tenderness to the left ankle without deformity  Skin:    General: Skin is warm.     Capillary Refill: Capillary refill takes less than 2 seconds.  Neurological:     General: No focal deficit present.     Mental Status: He is alert and oriented to person, place, and time. Mental status is at baseline.     Cranial Nerves: No cranial nerve deficit.     Motor: No abnormal muscle tone.     Coordination: Coordination normal.     Comments:  5/5 strength throughout. CN 2-12 intact.Equal grip strength.   Psychiatric:        Behavior: Behavior normal.     ED Results / Procedures / Treatments   Labs (all labs ordered are listed, but only abnormal results are displayed) Labs Reviewed  CBC WITH DIFFERENTIAL/PLATELET - Abnormal; Notable for the following components:      Result Value   Lymphs Abs 0.6 (*)    All other components within normal limits  BASIC METABOLIC PANEL - Abnormal; Notable for the following components:   Glucose, Bld 139 (*)    All other components within normal limits    EKG EKG  Interpretation  Date/Time:  Sunday May 06 2019 15:07:00  EST Ventricular Rate:  71 PR Interval:    QRS Duration: 94 QT Interval:  429 QTC Calculation: 467 R Axis:   7 Text Interpretation: Sinus rhythm Atrial premature complex No significant change was found Confirmed by Ezequiel Essex [phone]) on 05/06/2019 3:24:36 PM   Radiology DG Chest 2 View  Result Date: 05/06/2019 CLINICAL DATA:  Status post fall. Hit head. EXAM: CHEST - 2 VIEW COMPARISON:  11/11/2015 FINDINGS: There is a left chest wall pacer device with leads in the right atrial appendage and right ventricle. The heart size appears normal. No pleural effusion or edema identified. No acute osseous abnormality identified. IMPRESSION: No active cardiopulmonary abnormalities. Electronically Signed   By: Kerby Moors M.D.   On: 05/06/2019 16:52   DG Pelvis 1-2 Views  Result Date: 05/06/2019 CLINICAL DATA:  Fall with pain. EXAM: PELVIS - 1-2 VIEW COMPARISON:  None. FINDINGS: Two AP views of the pelvis. Femoral heads are located. No acute fracture. Radiation seeds in the prostate. Sacroiliac joints are symmetric. Degenerate disc disease involves L4-5 and L5-S1. IMPRESSION: No acute osseous abnormality. Electronically Signed   By: Abigail Miyamoto M.D.   On: 05/06/2019 16:52   DG Wrist Complete Right  Result Date: 05/06/2019 CLINICAL DATA:  Patient fell down the front steps now with wrist pain. EXAM: RIGHT WRIST - COMPLETE 3+ VIEW COMPARISON:  None. FINDINGS: There is an oblique minimally displaced intra articular fracture of the distal radius. No additional fracture identified in the proximal right hand or illness. Diffuse osteopenia. There is regional soft tissue swelling. IMPRESSION: Minimally displaced intra-articular fracture of the distal right radius. Electronically Signed   By: Audie Pinto M.D.   On: 05/06/2019 13:56   DG Ankle Complete Left  Result Date: 05/06/2019 CLINICAL DATA:  Left ankle pain after fall EXAM: LEFT ANKLE  COMPLETE - 3+ VIEW COMPARISON:  None. FINDINGS: There is no evidence of acute fracture or dislocation. Well corticated osseous density dorsal to the talar head suggesting sequela of remote trauma. Mild degenerative changes. Soft tissues are unremarkable. IMPRESSION: No acute osseous abnormality, left ankle. Electronically Signed   By: Davina Poke D.O.   On: 05/06/2019 16:55   CT Head Wo Contrast  Result Date: 05/06/2019 CLINICAL DATA:  Fall down steps. Uncertain loss of consciousness. EXAM: CT HEAD WITHOUT CONTRAST CT CERVICAL SPINE WITHOUT CONTRAST TECHNIQUE: Multidetector CT imaging of the head and cervical spine was performed following the standard protocol without intravenous contrast. Multiplanar CT image reconstructions of the cervical spine were also generated. COMPARISON:  Sep 01, 2016. FINDINGS: CT HEAD FINDINGS Brain: Mild chronic ischemic white matter disease is noted. 9 mm right frontal contusion is noted. No mass effect or midline shift is noted. Ventricular size is within normal limits. There is no evidence of mass lesion or acute infarction. Vascular: No hyperdense vessel or unexpected calcification. Skull: Normal. Negative for fracture or focal lesion. Sinuses/Orbits: No acute finding. Other: None. CT CERVICAL SPINE FINDINGS Alignment: Mild grade 1 retrolisthesis is noted at C4-5 and C5-6 secondary to severe degenerative disc disease at these levels. Skull base and vertebrae: No acute fracture. No primary bone lesion or focal pathologic process. Soft tissues and spinal canal: No prevertebral fluid or swelling. No visible canal hematoma. Disc levels: Severe degenerative disc disease is noted at C4-5 and C5-6 with anterior posterior osteophyte formation. Upper chest: Negative. Other: Degenerative changes are seen involving posterior facet joints bilaterally. IMPRESSION: Small right frontal hemorrhagic intraparenchymal contusion is noted. Mild chronic ischemic white matter disease. No  mass effect  or midline shift is noted. Critical Value/emergent results were called by telephone at the time of interpretation on 05/06/2019 at 4:28 pm to provider [facility] , who verbally acknowledged these results. Severe multilevel degenerative disc disease. No acute abnormality seen in the cervical spine. Electronically Signed   By: Marijo Conception M.D.   On: 05/06/2019 16:29   CT Cervical Spine Wo Contrast  Result Date: 05/06/2019 CLINICAL DATA:  Fall down steps. Uncertain loss of consciousness. EXAM: CT HEAD WITHOUT CONTRAST CT CERVICAL SPINE WITHOUT CONTRAST TECHNIQUE: Multidetector CT imaging of the head and cervical spine was performed following the standard protocol without intravenous contrast. Multiplanar CT image reconstructions of the cervical spine were also generated. COMPARISON:  Sep 01, 2016. FINDINGS: CT HEAD FINDINGS Brain: Mild chronic ischemic white matter disease is noted. 9 mm right frontal contusion is noted. No mass effect or midline shift is noted. Ventricular size is within normal limits. There is no evidence of mass lesion or acute infarction. Vascular: No hyperdense vessel or unexpected calcification. Skull: Normal. Negative for fracture or focal lesion. Sinuses/Orbits: No acute finding. Other: None. CT CERVICAL SPINE FINDINGS Alignment: Mild grade 1 retrolisthesis is noted at C4-5 and C5-6 secondary to severe degenerative disc disease at these levels. Skull base and vertebrae: No acute fracture. No primary bone lesion or focal pathologic process. Soft tissues and spinal canal: No prevertebral fluid or swelling. No visible canal hematoma. Disc levels: Severe degenerative disc disease is noted at C4-5 and C5-6 with anterior posterior osteophyte formation. Upper chest: Negative. Other: Degenerative changes are seen involving posterior facet joints bilaterally. IMPRESSION: Small right frontal hemorrhagic intraparenchymal contusion is noted. Mild chronic ischemic white matter disease. No mass  effect or midline shift is noted. Critical Value/emergent results were called by telephone at the time of interpretation on 05/06/2019 at 4:28 pm to provider [facility] , who verbally acknowledged these results. Severe multilevel degenerative disc disease. No acute abnormality seen in the cervical spine. Electronically Signed   By: Marijo Conception M.D.   On: 05/06/2019 16:29   DG Hand Complete Right  Result Date: 05/06/2019 CLINICAL DATA:  Right hand and wrist pain after fall EXAM: RIGHT HAND - COMPLETE 3+ VIEW COMPARISON:  Wrist x-ray 05/06/2019 FINDINGS: Redemonstration of a intra-articular fracture of the distal right radius, better characterized on dedicated radiographs of the wrist. Nonstandard alignment of the hand. No additional fractures are evident. Diffuse osseous demineralization. Mild degenerative changes. Soft tissue swelling at the wrist. IMPRESSION: 1. Intra-articular fracture of the distal right radius, better characterized on dedicated radiographs of the wrist. 2. No additional fractures are evident. Electronically Signed   By: Davina Poke D.O.   On: 05/06/2019 16:59    Procedures .Critical Care Performed by: Ezequiel Essex, MD Authorized by: Ezequiel Essex, MD   Critical care provider statement:    Critical care time (minutes):  35   Critical care was time spent personally by me on the following activities:  Discussions with consultants, evaluation of patient's response to treatment, examination of patient, ordering and performing treatments and interventions, ordering and review of laboratory studies, ordering and review of radiographic studies, pulse oximetry, re-evaluation of patient's condition, obtaining history from patient or surrogate and review of old charts   (including critical care time)  Medications Ordered in ED Medications  Tdap (BOOSTRIX) injection 0.5 mL (has no administration in time range)    ED Course  I have reviewed the triage vital signs and  the nursing  notes.  Pertinent labs & imaging results that were available during my care of the patient were reviewed by me and considered in my medical decision making (see chart for details).    MDM Rules/Calculators/A&P                      Fall of uncertain etiology but no preceding dizziness or lightheadedness.  No chest pain or shortness of breath.  EKG is sinus rhythm.  Will interrogate pacemaker.  X-ray shows minimally displaced right radius fracture.  Pacemaker interrogation shows no acute events since April 2020.  CT head does show a small intraparenchymal hemorrhagic contusion about 9 mm in the right frontal lobe.  No mass-effect.  Discussed with neurosurgery Kim NP.  Will reverse Coumadin with Kcentra and vitamin K.  Plan for hospitalist admission for observation.  Remainder of x-rays are negative.  Patient's wrist is splinted as above.  He remains awake and alert.  Kcentra and vitamin K reversal ordered for his Coumadin. Repeat head CT in the morning.  Observation admission discussed with Dr. Jonelle Sidle.  Final Clinical Impression(s) / ED Diagnoses Final diagnoses:  Fall, initial encounter  Traumatic hemorrhage of right cerebrum with loss of consciousness, initial encounter [facility])  Closed fracture of distal end of right radius, unspecified fracture morphology, initial encounter    Rx / DC Orders ED Discharge Orders    None       Yvonne Petite, Annie Main, MD 05/06/19 1920

## 2019-05-06 NOTE — H&P (Signed)
History and Physical   [name] [id number] DOB: December 07, 1925 DOA: 05/06/2019  Referring MD/NP/PA: Dr Wyvonnia Dusky  PCP: Gabriel Post, MD   Outpatient Specialists: Virl Axe, MD cardiology  Patient coming from: Home  Chief Complaint: Fall  HPI: [name] is a 86 y.o. male with medical history significant of atrial fibrillation on chronic anticoagulation with warfarin, systolic dysfunction CHF, his EF of 20% in the past, hypertension, hypothyroidism, polymyalgia rheumatica, hyperlipidemia and BPH who came to the ER after he was brought in for acute mechanical fall.  Patient was apparently in the front yard on the front porch and somehow skipped a step and fell down.  He fell couple of steps onto a brick pressure.  No premonition no dizziness.  His shoes may have stumbled.  He sustained injury to his right wrist which was found to be fractured.  Also pain in his right ankle but no bony damage.  Patient did not lose any consciousness.  His head CT showed a small intracranial bleed.  Patient is being admitted for monitoring.  Neurosurgery already consulted.  He denied any other symptoms at this point.  No prior fall or head injury..  ED Course: Temperature is 97.8 blood pressure 181/85 pulse 64 respirate 24 oxygen sats 97% on room air.  PT 20.5 INR 1.8.  COVID-19 screen is negative.  Chemistry and CBC entirely within normal.  X-ray of the right wrist showed minimally displaced intra-articular fracture of the distal right radius, and cervical spine showed small right frontal hemorrhagic intraparenchymal contusion.  Mild chronic ischemic white matter disease with no mass-effect.  Also severe multilevel degenerative disc disease.  X-rays of other joints showed no obvious damage.  Patient is discussed with neurosurgery and will be admitted for monitoring of his intracranial bleed.  Review of Systems: As per HPI otherwise 10 point review of systems negative.    Past Medical History:   Diagnosis Date  . AF (atrial fibrillation) [facility])    a. s/p RFCA/PVI @ [facility] clinic 2007.  . Arthritis    "joints" (06/21/2013)  . Atrial flutter ([place])    a. 06/2013 s/p TEE/DCCV  . Chronic systolic CHF (congestive heart failure) ([place])    a. 06/2013 Echo: EF 20%, diff HK, mild LVH.  Marland Kitchen History of cardiovascular stress test    Lexiscan Myoview 8/16:  EF 59%, attenuation artifact, no ischemia; Low Risk  . Hyperlipidemia   . HYPERTENSION, BENIGN 08/21/2009   Qualifier: Diagnosis of  By: Caryl Comes, MD, Remus Blake  Medications(s)  enalapril   . Hypothyroidism   . Neoplasm of uncertain behavior    SKIN  . Polymyalgia rheumatica ([place]) 01/13/2007   Qualifier: History of  By: Danny Lawless CMA, [place]       Past Surgical History:  Procedure Laterality Date  . ATRIAL FIBRILLATION ABLATION  2007  . CARDIOVERSION N/A 06/22/2013   Procedure: CARDIOVERSION;  Surgeon: Sueanne Margarita, MD;  Location: [facility] ENDOSCOPY;  Service: Cardiovascular;  Laterality: N/A;  15:17 Lido 20mg ,IV , Propofol 30 mg, IV synched cardioversion @ 150 joules by Dr. Lajoyce Lauber from a flutter to sinis brady which is baseline for this pt, reports 45-50 reg HR verified by Dr. Ashok Norris  . CARDIOVERSION N/A 11/21/2014   Procedure: CARDIOVERSION;  Surgeon: Fay Records, MD;  Location: [place];  Service: Cardiovascular;  Laterality: N/A;  . CARDIOVERSION N/A 07/14/2015   Procedure: CARDIOVERSION;  Surgeon: Sueanne Margarita, MD;  Location: [place];  Service: Cardiovascular;  Laterality: N/A;  .  CATARACT EXTRACTION, BILATERAL Bilateral Summer 2009  . CHOLECYSTECTOMY    . EP IMPLANTABLE DEVICE N/A 11/10/2015   Procedure: Pacemaker Implant;  Surgeon: [name], MD;  Location: [facility];  Service: Cardiovascular;  Laterality: N/A;  . HAND SURGERY Right    "broke bone; grafted area w/bone from somewhere else"  . HEMORRHOID SURGERY    . INGUINAL HERNIA REPAIR Bilateral   . TEE WITHOUT CARDIOVERSION N/A 06/22/2013    Procedure: TRANSESOPHAGEAL ECHOCARDIOGRAM (TEE);  Surgeon: Sueanne Margarita, MD;  Location: [facility] ENDOSCOPY;  Service: Cardiovascular;  Laterality: N/A;  . TEE WITHOUT CARDIOVERSION N/A 07/14/2015   Procedure: TRANSESOPHAGEAL ECHOCARDIOGRAM (TEE);  Surgeon: Sueanne Margarita, MD;  Location: [facility] ENDOSCOPY;  Service: Cardiovascular;  Laterality: N/A;  . TONSILLECTOMY       reports that he quit smoking about 56 years ago. His smoking use included cigarettes. He has a 12.00 pack-year smoking history. He has never used smokeless tobacco. He reports current alcohol use. He reports that he does not use drugs.  No Known Allergies  Family History  Problem Relation Age of Onset  . Emphysema Mother   . Diabetes Mother   . Kidney disease Father      Prior to Admission medications   Medication Sig Start Date End Date Taking? Authorizing Provider  amLODipine (NORVASC) 5 MG tablet Take 1 tablet (5 mg total) by mouth daily. 08/07/18   Burchette, Alinda Sierras, MD  atorvastatin (LIPITOR) 10 MG tablet Take 1 tablet (10 mg total) by mouth daily. 08/07/18   Burchette, Alinda Sierras, MD  cholecalciferol (VITAMIN D) 1000 UNITS tablet Take 1,000 Units by mouth every morning.    [provider]  furosemide (LASIX) 20 MG tablet TAKE 1 TABLET BY MOUTH EVERY DAY Patient taking differently: Take 20 mg by mouth daily.  09/26/18   Burchette, Alinda Sierras, MD  Glucosamine-Chondroitin 500-400 MG CAPS Take 1 capsule by mouth every morning.    [provider]  levothyroxine (SYNTHROID, LEVOTHROID) 50 MCG tablet Take 1 tablet (50 mcg total) by mouth daily. 08/07/18   Burchette, Alinda Sierras, MD  metoprolol succinate (TOPROL-XL) 50 MG 24 hr tablet TAKE 1 TABLET BY MOUTH DAILY WITH OR IMMEDIATELY FOLLOWING A MEAL Patient taking differently: Take 50 mg by mouth daily.  01/23/19   [name], MD  Multiple Vitamins-Minerals (CENTRUM SILVER PO) Take 1 tablet by mouth every morning.     [provider]  warfarin (COUMADIN) 2 MG tablet  Take as directed by the coumadin clinic 04/23/19   [name], MD    Physical Exam: Vitals:   05/06/19 2111 05/06/19 2200 05/06/19 2300 05/06/19 2303  BP: (!) 154/72 (!) 147/69 (!) 156/58 (!) 150/63  Pulse: (!) 59  60 60  Resp: 19 (!) 24 (!) 22 17  Temp:    97.8 F (36.6 C)  TempSrc:    Oral  SpO2: 98%  98%   Weight:      Height:          Constitutional: Acutely ill looking, worried Vitals:   05/06/19 2111 05/06/19 2200 05/06/19 2300 05/06/19 2303  BP: (!) 154/72 (!) 147/69 (!) 156/58 (!) 150/63  Pulse: (!) 59  60 60  Resp: 19 (!) 24 (!) 22 17  Temp:    97.8 F (36.6 C)  TempSrc:    Oral  SpO2: 98%  98%   Weight:      Height:       Eyes: PERRL, lids and conjunctivae  normal ENMT: Mucous membranes are moist. Posterior pharynx clear of any exudate or lesions.Normal dentition.  Neck: normal, supple, no masses, no thyromegaly Respiratory: clear to auscultation bilaterally, no wheezing, no crackles. Normal respiratory effort. No accessory muscle use.  Cardiovascular: Irregularly irregular rate and rhythm, no murmurs / rubs / gallops. No extremity edema. 2+ pedal pulses. No carotid bruits.  Abdomen: no tenderness, no masses palpated. No hepatosplenomegaly. Bowel sounds positive.  Musculoskeletal: no clubbing / cyanosis.  Right wrist deformed currently in a cast tender and swollen. Good ROM, no contractures. Normal muscle tone.  Skin: no rashes, lesions, ulcers. No induration Neurologic: CN 2-12 grossly intact. Sensation intact, DTR normal. Strength 5/5 in all 4.  Psychiatric: Normal judgment and insight. Alert and oriented x 3. Normal mood.     Labs on Admission: I have personally reviewed following labs and imaging studies  CBC: Recent Labs  Lab 05/06/19 1320  WBC 7.8  NEUTROABS 6.5  HGB 13.6  HCT 42.6  MCV 96.4  PLT 99991111   Basic Metabolic Panel: Recent Labs  Lab 05/06/19 1320  NA 139  K 4.7  CL 102  CO2 29  GLUCOSE 139*  BUN 21  CREATININE 1.04   CALCIUM 9.2   GFR: Estimated Creatinine Clearance: 45.4 mL/min (by C-G formula based on SCr of 1.04 mg/dL). Liver Function Tests: No results for input(s): AST, ALT, ALKPHOS, BILITOT, PROT, ALBUMIN in the last 168 hours. No results for input(s): LIPASE, AMYLASE in the last 168 hours. No results for input(s): AMMONIA in the last 168 hours. Coagulation Profile: Recent Labs  Lab 05/03/19 1134 05/06/19 1741 05/06/19 2007  INR 2.2 1.8* 1.2   Cardiac Enzymes: No results for input(s): CKTOTAL, CKMB, CKMBINDEX, TROPONINI in the last 168 hours. BNP (last 3 results) No results for input(s): PROBNP in the last 8760 hours. HbA1C: No results for input(s): HGBA1C in the last 72 hours. CBG: No results for input(s): GLUCAP in the last 168 hours. Lipid Profile: No results for input(s): CHOL, HDL, LDLCALC, TRIG, CHOLHDL, LDLDIRECT in the last 72 hours. Thyroid Function Tests: No results for input(s): TSH, T4TOTAL, FREET4, T3FREE, THYROIDAB in the last 72 hours. Anemia Panel: No results for input(s): VITAMINB12, FOLATE, FERRITIN, TIBC, IRON, RETICCTPCT in the last 72 hours. Urine analysis:    Component Value Date/Time   COLORURINE AMBER (A) 03/06/2017 2206   APPEARANCEUR HAZY (A) 03/06/2017 2206   LABSPEC 1.023 03/06/2017 2206   PHURINE 7.0 03/06/2017 2206   GLUCOSEU NEGATIVE 03/06/2017 2206   HGBUR NEGATIVE 03/06/2017 2206   BILIRUBINUR NEGATIVE 03/06/2017 2206   KETONESUR 20 (A) 03/06/2017 2206   PROTEINUR 30 (A) 03/06/2017 2206   NITRITE NEGATIVE 03/06/2017 2206   LEUKOCYTESUR NEGATIVE 03/06/2017 2206   Sepsis Labs: @LABRCNTIP (procalcitonin:4,lacticidven:4) )No results found for this or any previous visit (from the past 240 hour(s)).   Radiological Exams on Admission: DG Chest 2 View  Result Date: 05/06/2019 CLINICAL DATA:  Status Mccoy fall. Hit head. EXAM: CHEST - 2 VIEW COMPARISON:  11/11/2015 FINDINGS: There is a left chest wall pacer device with leads in the right atrial  appendage and right ventricle. The heart size appears normal. No pleural effusion or edema identified. No acute osseous abnormality identified. IMPRESSION: No active cardiopulmonary abnormalities. Electronically Signed   By: Kerby Moors M.D.   On: 05/06/2019 16:52   DG Pelvis 1-2 Views  Result Date: 05/06/2019 CLINICAL DATA:  Fall with pain. EXAM: PELVIS - 1-2 VIEW COMPARISON:  None. FINDINGS: Two AP views of the pelvis.  Femoral heads are located. No acute fracture. Radiation seeds in the prostate. Sacroiliac joints are symmetric. Degenerate disc disease involves L4-5 and L5-S1. IMPRESSION: No acute osseous abnormality. Electronically Signed   By: Abigail Miyamoto M.D.   On: 05/06/2019 16:52   DG Wrist Complete Right  Result Date: 05/06/2019 CLINICAL DATA:  Patient fell down the front steps now with wrist pain. EXAM: RIGHT WRIST - COMPLETE 3+ VIEW COMPARISON:  None. FINDINGS: There is an oblique minimally displaced intra articular fracture of the distal radius. No additional fracture identified in the proximal right hand or illness. Diffuse osteopenia. There is regional soft tissue swelling. IMPRESSION: Minimally displaced intra-articular fracture of the distal right radius. Electronically Signed   By: Audie Pinto M.D.   On: 05/06/2019 13:56   DG Ankle Complete Left  Result Date: 05/06/2019 CLINICAL DATA:  Left ankle pain after fall EXAM: LEFT ANKLE COMPLETE - 3+ VIEW COMPARISON:  None. FINDINGS: There is no evidence of acute fracture or dislocation. Well corticated osseous density dorsal to the talar head suggesting sequela of remote trauma. Mild degenerative changes. Soft tissues are unremarkable. IMPRESSION: No acute osseous abnormality, left ankle. Electronically Signed   By: Davina Poke D.O.   On: 05/06/2019 16:55   CT Head Wo Contrast  Result Date: 05/06/2019 CLINICAL DATA:  Fall down steps. Uncertain loss of consciousness. EXAM: CT HEAD WITHOUT CONTRAST CT CERVICAL SPINE WITHOUT CONTRAST  TECHNIQUE: Multidetector CT imaging of the head and cervical spine was performed following the standard protocol without intravenous contrast. Multiplanar CT image reconstructions of the cervical spine were also generated. COMPARISON:  Sep 01, 2016. FINDINGS: CT HEAD FINDINGS Brain: Mild chronic ischemic white matter disease is noted. 9 mm right frontal contusion is noted. No mass effect or midline shift is noted. Ventricular size is within normal limits. There is no evidence of mass lesion or acute infarction. Vascular: No hyperdense vessel or unexpected calcification. Skull: Normal. Negative for fracture or focal lesion. Sinuses/Orbits: No acute finding. Other: None. CT CERVICAL SPINE FINDINGS Alignment: Mild grade 1 retrolisthesis is noted at C4-5 and C5-6 secondary to severe degenerative disc disease at these levels. Skull base and vertebrae: No acute fracture. No primary bone lesion or focal pathologic process. Soft tissues and spinal canal: No prevertebral fluid or swelling. No visible canal hematoma. Disc levels: Severe degenerative disc disease is noted at C4-5 and C5-6 with anterior posterior osteophyte formation. Upper chest: Negative. Other: Degenerative changes are seen involving posterior facet joints bilaterally. IMPRESSION: Small right frontal hemorrhagic intraparenchymal contusion is noted. Mild chronic ischemic white matter disease. No mass effect or midline shift is noted. Critical Value/emergent results were called by telephone at the time of interpretation on 05/06/2019 at 4:28 pm to provider [facility] , who verbally acknowledged these results. Severe multilevel degenerative disc disease. No acute abnormality seen in the cervical spine. Electronically Signed   By: Marijo Conception M.D.   On: 05/06/2019 16:29   CT Cervical Spine Wo Contrast  Result Date: 05/06/2019 CLINICAL DATA:  Fall down steps. Uncertain loss of consciousness. EXAM: CT HEAD WITHOUT CONTRAST CT CERVICAL SPINE WITHOUT  CONTRAST TECHNIQUE: Multidetector CT imaging of the head and cervical spine was performed following the standard protocol without intravenous contrast. Multiplanar CT image reconstructions of the cervical spine were also generated. COMPARISON:  Sep 01, 2016. FINDINGS: CT HEAD FINDINGS Brain: Mild chronic ischemic white matter disease is noted. 9 mm right frontal contusion is noted. No mass effect or midline shift is noted. Ventricular  size is within normal limits. There is no evidence of mass lesion or acute infarction. Vascular: No hyperdense vessel or unexpected calcification. Skull: Normal. Negative for fracture or focal lesion. Sinuses/Orbits: No acute finding. Other: None. CT CERVICAL SPINE FINDINGS Alignment: Mild grade 1 retrolisthesis is noted at C4-5 and C5-6 secondary to severe degenerative disc disease at these levels. Skull base and vertebrae: No acute fracture. No primary bone lesion or focal pathologic process. Soft tissues and spinal canal: No prevertebral fluid or swelling. No visible canal hematoma. Disc levels: Severe degenerative disc disease is noted at C4-5 and C5-6 with anterior posterior osteophyte formation. Upper chest: Negative. Other: Degenerative changes are seen involving posterior facet joints bilaterally. IMPRESSION: Small right frontal hemorrhagic intraparenchymal contusion is noted. Mild chronic ischemic white matter disease. No mass effect or midline shift is noted. Critical Value/emergent results were called by telephone at the time of interpretation on 05/06/2019 at 4:28 pm to provider [facility] , who verbally acknowledged these results. Severe multilevel degenerative disc disease. No acute abnormality seen in the cervical spine. Electronically Signed   By: Marijo Conception M.D.   On: 05/06/2019 16:29   DG Hand Complete Right  Result Date: 05/06/2019 CLINICAL DATA:  Right hand and wrist pain after fall EXAM: RIGHT HAND - COMPLETE 3+ VIEW COMPARISON:  Wrist x-ray 05/06/2019  FINDINGS: Redemonstration of a intra-articular fracture of the distal right radius, better characterized on dedicated radiographs of the wrist. Nonstandard alignment of the hand. No additional fractures are evident. Diffuse osseous demineralization. Mild degenerative changes. Soft tissue swelling at the wrist. IMPRESSION: 1. Intra-articular fracture of the distal right radius, better characterized on dedicated radiographs of the wrist. 2. No additional fractures are evident. Electronically Signed   By: Davina Poke D.O.   On: 05/06/2019 16:59      Assessment/Plan Principal Problem:   Intracranial bleed (HCC) Active Problems:   Hypothyroidism   Hyperlipidemia   HYPERTENSION, BENIGN   Polymyalgia rheumatica (HCC)   PAF (paroxysmal atrial fibrillation) (HCC)   CKD (chronic kidney disease) stage 3, GFR 30-59 ml/min   NICM (nonischemic cardiomyopathy) ([place])   Right wrist fracture     #1 intracranial bleed: Small traumatic with contusion.  Patient will be admitted to progressive care.  Frequent neurochecks.  Recheck CT head in the morning.  Monitor closely.  PT and OT consult and neurosurgical follow-up.  Patient has received Kcentra and vitamin K.  INR reversed 1.2 so far.  Holding Coumadin  #2 right radial fracture: Distal fracture at the wrist.  Splint already applied.  Pain management and follow-up with orthopedics.  #3 hypertension: Resume home regimen.  #4 atrial fibrillation: Rate is controlled.  Holding Coumadin  #5 chronic kidney disease stage III: At baseline.  Continue monitor  #6 nonischemic cardiomyopathy: We will be careful with IV fluids.  Continue to monitor.  #7 hypothyroidism: Continue to replete.     DVT prophylaxis: SCD Code Status: Full code Family Communication: No family at bedside but son over the phone Disposition Plan: To be determined Consults called: Neurosurgery PA Kim. Admission status: Inpatient  Severity of Illness: The appropriate patient  status for this patient is INPATIENT. Inpatient status is judged to be reasonable and necessary in order to provide the required intensity of service to ensure the patient's safety. The patient's presenting symptoms, physical exam findings, and initial radiographic and laboratory data in the context of their chronic comorbidities is felt to place them at high risk for further clinical deterioration. Furthermore, it  is not anticipated that the patient will be medically stable for discharge from the hospital within 2 midnights of admission. The following factors support the patient status of inpatient.   " The patient's presenting symptoms include mechanical fall. " The worrisome physical exam findings include evidence of fracture on the right wrist. " The initial radiographic and laboratory data are worrisome because of head CT showing intracranial bleed and the right radius fracture on x-ray. " The chronic co-morbidities include hypertension diabetes.   * I certify that at the point of admission it is my clinical judgment that the patient will require inpatient hospital care spanning beyond 2 midnights from the point of admission due to high intensity of service, high risk for further deterioration and high frequency of surveillance required.Barbette Merino MD [facility] Pager [phone]  If 7PM-7AM, please contact night-coverage [email or web address] Password [facility]  05/06/2019, 11:34 PM

## 2019-05-06 NOTE — ED Notes (Signed)
Clarified order for CT with Dr. Jonelle Sidle. Per Dr.Garba CT to be completed in the morning

## 2019-05-06 NOTE — ED Triage Notes (Addendum)
Pt fell down front steps-- hit head, scrape to right temple, right wrist pain also. TAKES COUMADIN Unsure of LOC-- states "I don't think I passed out, but I don't remember it too much"  Right wrist swollen, radial pulse present

## 2019-05-07 ENCOUNTER — Inpatient Hospital Stay (HOSPITAL_COMMUNITY): Payer: Medicare Other

## 2019-05-07 DIAGNOSIS — I629 Nontraumatic intracranial hemorrhage, unspecified: Secondary | ICD-10-CM

## 2019-05-07 DIAGNOSIS — S5291XA Unspecified fracture of right forearm, initial encounter for closed fracture: Secondary | ICD-10-CM

## 2019-05-07 LAB — PROTIME-INR
INR: 1.2 (ref 0.8–1.2)
Prothrombin Time: 15.1 seconds (ref 11.4–15.2)

## 2019-05-07 LAB — COMPREHENSIVE METABOLIC PANEL
ALT: 8 U/L (ref 0–44)
AST: 31 U/L (ref 15–41)
Albumin: 3.4 g/dL — ABNORMAL LOW (ref 3.5–5.0)
Alkaline Phosphatase: 59 U/L (ref 38–126)
Anion gap: 10 (ref 5–15)
BUN: 21 mg/dL (ref 8–23)
CO2: 24 mmol/L (ref 22–32)
Calcium: 8.5 mg/dL — ABNORMAL LOW (ref 8.9–10.3)
Chloride: 105 mmol/L (ref 98–111)
Creatinine, Ser: 0.95 mg/dL (ref 0.61–1.24)
GFR calc Af Amer: >60 mL/min (ref >60)
GFR calc non Af Amer: >60 mL/min (ref >60)
Glucose, Bld: 145 mg/dL — ABNORMAL HIGH (ref 70–99)
Potassium: 4.4 mmol/L (ref 3.5–5.1)
Sodium: 139 mmol/L (ref 135–145)
Total Bilirubin: 1.3 mg/dL — ABNORMAL HIGH (ref 0.3–1.2)
Total Protein: 6 g/dL — ABNORMAL LOW (ref 6.5–8.1)

## 2019-05-07 LAB — CBC
HCT: 39.7 % (ref 39.0–52.0)
Hemoglobin: 12.6 g/dL — ABNORMAL LOW (ref 13.0–17.0)
MCH: 31.2 pg (ref 26.0–34.0)
MCHC: 31.7 g/dL (ref 30.0–36.0)
MCV: 98.3 fL (ref 80.0–100.0)
Platelets: 107 10*3/uL — ABNORMAL LOW (ref 150–400)
RBC: 4.04 MIL/uL — ABNORMAL LOW (ref 4.22–5.81)
RDW: 13.4 % (ref 11.5–15.5)
WBC: 8.3 10*3/uL (ref 4.0–10.5)
nRBC: 0 % (ref 0.0–0.2)

## 2019-05-07 LAB — SARS CORONAVIRUS 2 (TAT 6-24 HRS): SARS Coronavirus 2: NEGATIVE

## 2019-05-07 MED ORDER — OXYCODONE-ACETAMINOPHEN 5-325 MG PO TABS: 1.0000 | ORAL_TABLET | Freq: Four times a day (QID) | ORAL | 0 refills | Status: AC | PRN

## 2019-05-07 MED ORDER — WARFARIN SODIUM 2 MG PO TABS: ORAL_TABLET | ORAL | 0 refills | Status: DC

## 2019-05-07 NOTE — Progress Notes (Signed)
Patient ID: [name], male   DOB: Sep 19, 1925, 86 y.o.   MRN: [id number] Repeat CT scan today stable small right frontal hemorrhagic contusion not of any clinical significance with stable follow-up head CT would recommend remain off blood thinners for at least 72 hours preferably 2 weeks if patient can tolerate it.  No new neurosurgical recommendations

## 2019-05-07 NOTE — ED Notes (Signed)
full lenin and gown changed. Pt repositioned and warm blankets given

## 2019-05-07 NOTE — Discharge Summary (Signed)
Physician Discharge Summary  [name] [id number] DOB: 01-08-1926 DOA: 05/06/2019  PCP: [name], MD  Admit date: 05/06/2019 Discharge date: 05/07/2019  Admitted From: home Disposition:  home  Recommendations for Outpatient Follow-up:  1. Follow up with PCP in 1-2 weeks - HOLDING COUMADIN per neurosurgery recommendations for 72 hours at least, possibly for 2 weeks. Will need Lovenox bridging when restart coumadin, vs alternate anticoagulation Rx.  2. Follow up as directed with orthopedics for fracture care 3. Please obtain BMP/CBC and consider INR  4. Please follow up on the following pending results: none   Home Health:no  Equipment/Devices:none   Discharge Condition: stable CODE STATUS: FULL Diet recommendation: Heart Healthy  Brief/Interim Summary:  Patient presented to ED after what sounds like mechanical fall, slipping on wet pavement..  Does not recall what happened.  Patient does not member feeling dizzy or lightheaded. He did hit his head and right wrist.  Does take Coumadin for history of atrial fibrillation and has a pacemaker.  Denies losing consciousness.  No vomiting.  No chest pain or shortness of breath.  Complains of pain to his right face, right wrist, left ankle.  No chest pain or shortness of breath.  No focal weakness, numbness or tingling. Most of his pain was in his right wrist.   X-ray of the right wrist showed minimally displaced intra-articular fracture of the distal right radius, and cervical spine showed small right frontal hemorrhagic intraparenchymal contusion.  Mild chronic ischemic white matter disease with no mass-effect.  Also severe multilevel degenerative disc disease.  X-rays of other joints showed no obvious damage.  Patient was discussed with neurosurgery and was admitted for monitoring of his intracranial bleed. Per neurosurgeon: Repeat CT scan today 05/07/19 stable small right frontal hemorrhagic contusion not of any clinical  significance with stable follow-up head CT would recommend remain off blood thinners for at least 72 hours preferably 2 weeks if patient can tolerate it.  No new neurosurgical recommendations   Discharge Diagnoses:  Principal Problem:   Intracranial bleed ([facility]) Active Problems:   Hypothyroidism   Hyperlipidemia   HYPERTENSION, BENIGN   Polymyalgia rheumatica (HCC)   PAF (paroxysmal atrial fibrillation) (HCC)   CKD (chronic kidney disease) stage 3, GFR 30-59 ml/min   NICM (nonischemic cardiomyopathy) ([place])   Right wrist fracture   Right radial fracture    Discharge Instructions  Discharge Instructions    Call MD for:  difficulty breathing, headache or visual disturbances   Complete by: As directed    Call MD for:  persistant dizziness or light-headedness   Complete by: As directed    Call MD for:  persistant nausea and vomiting   Complete by: As directed    Call MD for:  severe uncontrolled pain   Complete by: As directed    Call MD for:  temperature >100.4   Complete by: As directed    Diet - low sodium heart healthy   Complete by: As directed    Increase activity slowly   Complete by: As directed      Allergies as of 05/07/2019   No Known Allergies     Medication List    TAKE these medications   amLODipine 5 MG tablet Commonly known as: NORVASC Take 1 tablet (5 mg total) by mouth daily. What changed: when to take this   atorvastatin 10 MG tablet Commonly known as: LIPITOR Take 1 tablet (10 mg total) by mouth daily. What changed: when to take this  b complex vitamins tablet Take 1 tablet by mouth daily.   furosemide 20 MG tablet Commonly known as: LASIX TAKE 1 TABLET BY MOUTH EVERY DAY What changed:   when to take this  additional instructions   Glucosamine-Chondroitin 500-400 MG Caps Take 1 capsule by mouth daily.   levothyroxine 50 MCG tablet Commonly known as: SYNTHROID Take 1 tablet (50 mcg total) by mouth daily. What changed: when to take  this   metoprolol succinate 50 MG 24 hr tablet Commonly known as: TOPROL-XL TAKE 1 TABLET BY MOUTH DAILY WITH OR IMMEDIATELY FOLLOWING A MEAL What changed:   how much to take  how to take this  when to take this  additional instructions   multivitamin with minerals Tabs tablet Take 1 tablet by mouth daily. Centrum   Refresh Optive Advanced 0.5-1-0.5 % Soln Generic drug: Carboxymeth-Glycerin-Polysorb Place 1 drop into both eyes every 4 (four) hours as needed (dry eyes).   warfarin 2 MG tablet Commonly known as: COUMADIN Take as directed. If you are unsure how to take this medication, talk to your nurse or doctor. Original instructions: HOLD THIS MEDICATION: neurosurgeon recommended remain off blood thinners for at least 72 hours, preferably 2 weeks if patient can tolerate it. What changed: additional instructions       No Known Allergies  Consultations:  Orthopedics  Neurosurgery    Procedures/Studies: DG Chest 2 View  Result Date: 05/06/2019 CLINICAL DATA:  Status post fall. Hit head. EXAM: CHEST - 2 VIEW COMPARISON:  11/11/2015 FINDINGS: There is a left chest wall pacer device with leads in the right atrial appendage and right ventricle. The heart size appears normal. No pleural effusion or edema identified. No acute osseous abnormality identified. IMPRESSION: No active cardiopulmonary abnormalities. Electronically Signed   By: Kerby Moors M.D.   On: 05/06/2019 16:52   DG Pelvis 1-2 Views  Result Date: 05/06/2019 CLINICAL DATA:  Fall with pain. EXAM: PELVIS - 1-2 VIEW COMPARISON:  None. FINDINGS: Two AP views of the pelvis. Femoral heads are located. No acute fracture. Radiation seeds in the prostate. Sacroiliac joints are symmetric. Degenerate disc disease involves L4-5 and L5-S1. IMPRESSION: No acute osseous abnormality. Electronically Signed   By: Abigail Miyamoto M.D.   On: 05/06/2019 16:52   DG Wrist Complete Right  Result Date: 05/06/2019 CLINICAL DATA:  Patient  fell down the front steps now with wrist pain. EXAM: RIGHT WRIST - COMPLETE 3+ VIEW COMPARISON:  None. FINDINGS: There is an oblique minimally displaced intra articular fracture of the distal radius. No additional fracture identified in the proximal right hand or illness. Diffuse osteopenia. There is regional soft tissue swelling. IMPRESSION: Minimally displaced intra-articular fracture of the distal right radius. Electronically Signed   By: Audie Pinto M.D.   On: 05/06/2019 13:56   DG Ankle Complete Left  Result Date: 05/06/2019 CLINICAL DATA:  Left ankle pain after fall EXAM: LEFT ANKLE COMPLETE - 3+ VIEW COMPARISON:  None. FINDINGS: There is no evidence of acute fracture or dislocation. Well corticated osseous density dorsal to the talar head suggesting sequela of remote trauma. Mild degenerative changes. Soft tissues are unremarkable. IMPRESSION: No acute osseous abnormality, left ankle. Electronically Signed   By: Davina Poke D.O.   On: 05/06/2019 16:55   CT HEAD WO CONTRAST  Result Date: 05/07/2019 CLINICAL DATA:  Fall.  Intracranial hemorrhage suspected. EXAM: CT HEAD WITHOUT CONTRAST TECHNIQUE: Contiguous axial images were obtained from the base of the skull through the vertex without intravenous contrast. COMPARISON:  05/06/2019 FINDINGS: Brain: No change since the study of yesterday. 9 mm maximal dimension intraparenchymal hemorrhage in the right frontal subcortical white matter is no larger. Mild surrounding edema. Elsewhere, the brain shows age related atrophy with chronic small-vessel ischemic changes. No subarachnoid or extra-axial hemorrhage. No mass, hydrocephalus or other lesion. Vascular: There is atherosclerotic calcification of the major vessels at the base of the brain. Skull: Negative Sinuses/Orbits: Clear/normal Other: None IMPRESSION: No change since yesterday. 9 mm maximal dimension intraparenchymal hemorrhage in the right frontal subcortical white matter is no larger. Small  amount surrounding edema is the same. Electronically Signed   By: Nelson Chimes M.D.   On: 05/07/2019 09:14   CT Head Wo Contrast  Result Date: 05/06/2019 CLINICAL DATA:  Fall down steps. Uncertain loss of consciousness. EXAM: CT HEAD WITHOUT CONTRAST CT CERVICAL SPINE WITHOUT CONTRAST TECHNIQUE: Multidetector CT imaging of the head and cervical spine was performed following the standard protocol without intravenous contrast. Multiplanar CT image reconstructions of the cervical spine were also generated. COMPARISON:  Sep 01, 2016. FINDINGS: CT HEAD FINDINGS Brain: Mild chronic ischemic white matter disease is noted. 9 mm right frontal contusion is noted. No mass effect or midline shift is noted. Ventricular size is within normal limits. There is no evidence of mass lesion or acute infarction. Vascular: No hyperdense vessel or unexpected calcification. Skull: Normal. Negative for fracture or focal lesion. Sinuses/Orbits: No acute finding. Other: None. CT CERVICAL SPINE FINDINGS Alignment: Mild grade 1 retrolisthesis is noted at C4-5 and C5-6 secondary to severe degenerative disc disease at these levels. Skull base and vertebrae: No acute fracture. No primary bone lesion or focal pathologic process. Soft tissues and spinal canal: No prevertebral fluid or swelling. No visible canal hematoma. Disc levels: Severe degenerative disc disease is noted at C4-5 and C5-6 with anterior posterior osteophyte formation. Upper chest: Negative. Other: Degenerative changes are seen involving posterior facet joints bilaterally. IMPRESSION: Small right frontal hemorrhagic intraparenchymal contusion is noted. Mild chronic ischemic white matter disease. No mass effect or midline shift is noted. Critical Value/emergent results were called by telephone at the time of interpretation on 05/06/2019 at 4:28 pm to provider [facility] , who verbally acknowledged these results. Severe multilevel degenerative disc disease. No acute abnormality  seen in the cervical spine. Electronically Signed   By: Marijo Conception M.D.   On: 05/06/2019 16:29   CT Cervical Spine Wo Contrast  Result Date: 05/06/2019 CLINICAL DATA:  Fall down steps. Uncertain loss of consciousness. EXAM: CT HEAD WITHOUT CONTRAST CT CERVICAL SPINE WITHOUT CONTRAST TECHNIQUE: Multidetector CT imaging of the head and cervical spine was performed following the standard protocol without intravenous contrast. Multiplanar CT image reconstructions of the cervical spine were also generated. COMPARISON:  Sep 01, 2016. FINDINGS: CT HEAD FINDINGS Brain: Mild chronic ischemic white matter disease is noted. 9 mm right frontal contusion is noted. No mass effect or midline shift is noted. Ventricular size is within normal limits. There is no evidence of mass lesion or acute infarction. Vascular: No hyperdense vessel or unexpected calcification. Skull: Normal. Negative for fracture or focal lesion. Sinuses/Orbits: No acute finding. Other: None. CT CERVICAL SPINE FINDINGS Alignment: Mild grade 1 retrolisthesis is noted at C4-5 and C5-6 secondary to severe degenerative disc disease at these levels. Skull base and vertebrae: No acute fracture. No primary bone lesion or focal pathologic process. Soft tissues and spinal canal: No prevertebral fluid or swelling. No visible canal hematoma. Disc levels: Severe degenerative disc disease is noted  at C4-5 and C5-6 with anterior posterior osteophyte formation. Upper chest: Negative. Other: Degenerative changes are seen involving posterior facet joints bilaterally. IMPRESSION: Small right frontal hemorrhagic intraparenchymal contusion is noted. Mild chronic ischemic white matter disease. No mass effect or midline shift is noted. Critical Value/emergent results were called by telephone at the time of interpretation on 05/06/2019 at 4:28 pm to provider [facility] , who verbally acknowledged these results. Severe multilevel degenerative disc disease. No acute  abnormality seen in the cervical spine. Electronically Signed   By: Marijo Conception M.D.   On: 05/06/2019 16:29   DG Hand Complete Right  Result Date: 05/06/2019 CLINICAL DATA:  Right hand and wrist pain after fall EXAM: RIGHT HAND - COMPLETE 3+ VIEW COMPARISON:  Wrist x-ray 05/06/2019 FINDINGS: Redemonstration of a intra-articular fracture of the distal right radius, better characterized on dedicated radiographs of the wrist. Nonstandard alignment of the hand. No additional fractures are evident. Diffuse osseous demineralization. Mild degenerative changes. Soft tissue swelling at the wrist. IMPRESSION: 1. Intra-articular fracture of the distal right radius, better characterized on dedicated radiographs of the wrist. 2. No additional fractures are evident. Electronically Signed   By: Davina Poke D.O.   On: 05/06/2019 16:59   CUP PACEART REMOTE DEVICE CHECK  Result Date: 04/17/2019 Scheduled remote reviewed.  Normal device function.  [place]- Warfarin Next remote 91 days- [employer], RN/CVRS     Subjective:   Discharge Exam: Vitals:   05/07/19 1100 05/07/19 1200  BP: (!) 144/63 (!) 156/68  Pulse:    Resp: (!) 22 (!) 30  Temp:    SpO2:     Vitals:   05/07/19 0800 05/07/19 1000 05/07/19 1100 05/07/19 1200  BP: (!) 144/74 (!) 157/80 (!) 144/63 (!) 156/68  Pulse: (!) 59     Resp: 14 17 (!) 22 (!) 30  Temp:      TempSrc:      SpO2: 95%     Weight:      Height:       Physical Exam Vitals and nursing note reviewed.  Constitutional:      General: He is not in acute distress.    Appearance: Normal appearance. He is well-developed and normal weight.  HENT:     Head: Normocephalic and atraumatic.     Comments: Abrasions right temple and right face. Neck:     Comments: No midline C-spine tenderness Cardiovascular:     Rate and Rhythm: Irreg/Irreg, rate is WNL.     Heart sounds: Normal heart sounds. No murmur.  Pulmonary:     Effort: Pulmonary effort is normal. No respiratory distress.      Breath sounds: Normal breath sounds.  Abdominal:     Abdomen is soft.  Musculoskeletal:        General: No tenderness. Normal range of motion.     R wrist is wrapped in splint, sensation to fingertips is intact     Cervical back: Normal range of motion and neck supple.  Skin:    General: Skin is warm.  Neurological:     General: No focal deficit present.     Mental Status: He is alert and oriented to person, place, and time. Mental status is at baseline.     Cranial Nerves: No cranial nerve deficit.     Motor: No abnormal muscle tone.     Coordination: Coordination normal..   Psychiatric:        Behavior: Behavior normal.    The results of significant diagnostics  from this hospitalization (including imaging, microbiology, ancillary and laboratory) are listed below for reference.     Microbiology: Recent Results (from the past 240 hour(s))  SARS CORONAVIRUS 2 (TAT 6-24 HRS) Nasopharyngeal Nasopharyngeal Swab     Status: None   Collection Time: 05/06/19  6:51 PM   Specimen: Nasopharyngeal Swab  Result Value Ref Range Status   SARS Coronavirus 2 NEGATIVE NEGATIVE Final    Comment: (NOTE) SARS-CoV-2 target nucleic acids are NOT DETECTED. The SARS-CoV-2 RNA is generally detectable in upper and lower respiratory specimens during the acute phase of infection. Negative results do not preclude SARS-CoV-2 infection, do not rule out co-infections with other pathogens, and should not be used as the sole basis for treatment or other patient management decisions. Negative results must be combined with clinical observations, patient history, and epidemiological information. The expected result is Negative. Fact Sheet for Patients: SugarRoll.be Fact Sheet for Healthcare Providers: https://www.woods-mathews.com/ This test is not yet approved or cleared by the [place] FDA and  has been authorized for detection and/or diagnosis of  SARS-CoV-2 by FDA under an Emergency Use Authorization (EUA). This EUA will remain  in effect (meaning this test can be used) for the duration of the COVID-19 declaration under Section 56 4(b)(1) of the Act, 21 U.S.C. section 360bbb-3(b)(1), unless the authorization is terminated or revoked sooner. Performed at [facility], [place] [address], [place], [place] [address]      Labs: BNP (last 3 results) No results for input(s): BNP in the last 8760 hours. Basic Metabolic Panel: Recent Labs  Lab 05/06/19 1320 05/07/19 0512  NA 139 139  K 4.7 4.4  CL 102 105  CO2 29 24  GLUCOSE 139* 145*  BUN 21 21  CREATININE 1.04 0.95  CALCIUM 9.2 8.5*   Liver Function Tests: Recent Labs  Lab 05/07/19 0512  AST 31  ALT 8  ALKPHOS 59  BILITOT 1.3*  PROT 6.0*  ALBUMIN 3.4*   No results for input(s): LIPASE, AMYLASE in the last 168 hours. No results for input(s): AMMONIA in the last 168 hours. CBC: Recent Labs  Lab 05/06/19 1320 05/07/19 0512  WBC 7.8 8.3  NEUTROABS 6.5  --   HGB 13.6 12.6*  HCT 42.6 39.7  MCV 96.4 98.3  PLT 155 107*   Cardiac Enzymes: No results for input(s): CKTOTAL, CKMB, CKMBINDEX, TROPONINI in the last 168 hours. BNP: Invalid input(s): POCBNP CBG: No results for input(s): GLUCAP in the last 168 hours. D-Dimer No results for input(s): DDIMER in the last 72 hours. Hgb A1c No results for input(s): HGBA1C in the last 72 hours. Lipid Profile No results for input(s): CHOL, HDL, LDLCALC, TRIG, CHOLHDL, LDLDIRECT in the last 72 hours. Thyroid function studies No results for input(s): TSH, T4TOTAL, T3FREE, THYROIDAB in the last 72 hours.  Invalid input(s): FREET3 Anemia work up No results for input(s): VITAMINB12, FOLATE, FERRITIN, TIBC, IRON, RETICCTPCT in the last 72 hours. Urinalysis    Component Value Date/Time   COLORURINE AMBER (A) 03/06/2017 2206   APPEARANCEUR HAZY (A) 03/06/2017 2206   LABSPEC 1.023 03/06/2017 2206   PHURINE 7.0  03/06/2017 2206   GLUCOSEU NEGATIVE 03/06/2017 2206   HGBUR NEGATIVE 03/06/2017 2206   BILIRUBINUR NEGATIVE 03/06/2017 2206   KETONESUR 20 (A) 03/06/2017 2206   PROTEINUR 30 (A) 03/06/2017 2206   NITRITE NEGATIVE 03/06/2017 2206   LEUKOCYTESUR NEGATIVE 03/06/2017 2206   Sepsis Labs Invalid input(s): PROCALCITONIN,  WBC,  LACTICIDVEN Microbiology Recent Results (from the past 240  hour(s))  SARS CORONAVIRUS 2 (TAT 6-24 HRS) Nasopharyngeal Nasopharyngeal Swab     Status: None   Collection Time: 05/06/19  6:51 PM   Specimen: Nasopharyngeal Swab  Result Value Ref Range Status   SARS Coronavirus 2 NEGATIVE NEGATIVE Final    Comment: (NOTE) SARS-CoV-2 target nucleic acids are NOT DETECTED. The SARS-CoV-2 RNA is generally detectable in upper and lower respiratory specimens during the acute phase of infection. Negative results do not preclude SARS-CoV-2 infection, do not rule out co-infections with other pathogens, and should not be used as the sole basis for treatment or other patient management decisions. Negative results must be combined with clinical observations, patient history, and epidemiological information. The expected result is Negative. Fact Sheet for Patients: SugarRoll.be Fact Sheet for Healthcare Providers: https://www.woods-mathews.com/ This test is not yet approved or cleared by the [place] FDA and  has been authorized for detection and/or diagnosis of SARS-CoV-2 by FDA under an Emergency Use Authorization (EUA). This EUA will remain  in effect (meaning this test can be used) for the duration of the COVID-19 declaration under Section 56 4(b)(1) of the Act, 21 U.S.C. section 360bbb-3(b)(1), unless the authorization is terminated or revoked sooner. Performed at [facility], [place] [address], [place], [place] [address]      Time coordinating discharge: Over 30 minutes  SIGNED:   Emeterio Reeve, DO  [facility] 05/07/2019, 12:32 PM

## 2019-05-07 NOTE — ED Notes (Signed)
Breakfast ordered 

## 2019-05-07 NOTE — Telephone Encounter (Signed)
Copied from [place] [phone]. Topic: General - Inquiry >> May 07, 2019  1:49 PM Richardo Priest, [place] wrote: Reason for CRM: Pt's son called in to inform PCP that pt fell and was taken to hospital for bleeding. Pt's son stated the physician in ED wanted PCP to follow up with pt to see if the warfin was needing to be continued. Advised pt's son to get an appointment and son in requesting PCP review chart first. Pt's son would also like if an order for home health can be made for pt has he hurt himself from fall and is needing more assistance. Please advise.

## 2019-05-07 NOTE — ED Notes (Signed)
Discharge instructions reviewed with son over the phone. Readying patient for discharge now.

## 2019-05-07 NOTE — ED Notes (Signed)
Paged admitting requesting provider to place orders for home medications

## 2019-05-07 NOTE — ED Notes (Signed)
Patient transported to CT 

## 2019-05-08 ENCOUNTER — Other Ambulatory Visit: Payer: Self-pay

## 2019-05-08 ENCOUNTER — Ambulatory Visit (INDEPENDENT_AMBULATORY_CARE_PROVIDER_SITE_OTHER): Payer: Medicare Other | Admitting: Family Medicine

## 2019-05-08 ENCOUNTER — Encounter: Payer: Self-pay | Admitting: Family Medicine

## 2019-05-08 DIAGNOSIS — I629 Nontraumatic intracranial hemorrhage, unspecified: Secondary | ICD-10-CM

## 2019-05-08 DIAGNOSIS — S62101D Fracture of unspecified carpal bone, right wrist, subsequent encounter for fracture with routine healing: Secondary | ICD-10-CM | POA: Diagnosis not present

## 2019-05-08 DIAGNOSIS — Z7901 Long term (current) use of anticoagulants: Secondary | ICD-10-CM

## 2019-05-08 DIAGNOSIS — I4891 Unspecified atrial fibrillation: Secondary | ICD-10-CM

## 2019-05-08 NOTE — Progress Notes (Signed)
This visit type was conducted due to national recommendations for restrictions regarding the COVID-19 pandemic in an effort to limit this patient's exposure and mitigate transmission in our community.   Virtual Visit via Video Note  I connected with [name] on 05/08/19 at 11:00 AM EST by a video enabled telemedicine application and verified that I am speaking with the correct person using two identifiers.  Location patient: home Location provider:work or home office Persons participating in the virtual visit: patient, provider, and patient's son who helped facilitate this call  I discussed the limitations of evaluation and management by telemedicine and the availability of in person appointments. The patient expressed understanding and agreed to proceed.   HPI:  Mr [name] has chronic problems including history of atrial fibrillation, nonischemic cardiomyopathy, hypertension, hypothyroidism, chronic kidney disease, past history of polymyalgia rheumatica.  This is hospital follow-up from recent admission.  He was seen in the ED on 1 3/21 after a fall occurred at home.  Patient had no recollection.  He apparently was on the front porch and went down a brick stoop that was 2 steps down and son thinks that he probably slipped on the brakes and fell.  In any event, he did apparently strike his head and noted some right wrist pain.  He is on chronic Coumadin for atrial fibrillation.  He was not aware of any recent syncope or any lightheadedness or dizziness.  No chest pains or dyspnea.  No focal weakness.  Taken to ED for evaluation.  Wrist x-ray showed minimally displaced intra-articular fracture of the distal right radius.  He was placed in immobilization for that.  CT of head showed small right frontal intraparenchymal contusion with area of bleed about 9 mm.  No mass-effect.  He was seen in consultation by neurosurgery and they recommend of course holding his Coumadin and repeat CT the next day  which showed stability.  Patient was given vitamin K on admission.  INR came down promptly.  He was discharged home the next day.  He feels stable at this time.  He seems clear mentally.  Had only some brief headache yesterday but none today.  They have held his Coumadin but otherwise medicines are the same.  He also apparently had mild left ankle sprain.  They are inquiring about home health follow-up for PT and nursing assessment.  They do not have appointment yet with orthopedics regarding the wrist fracture  ROS: See pertinent positives and negatives per HPI.  Past Medical History:  Diagnosis Date  . AF (atrial fibrillation) [facility])    a. s/p RFCA/PVI @ [facility] clinic 2007.  . Arthritis    "joints" (06/21/2013)  . Atrial flutter ([place])    a. 06/2013 s/p TEE/DCCV  . Chronic systolic CHF (congestive heart failure) ([place])    a. 06/2013 Echo: EF 20%, diff HK, mild LVH.  Marland Kitchen History of cardiovascular stress test    Lexiscan Myoview 8/16:  EF 59%, attenuation artifact, no ischemia; Low Risk  . Hyperlipidemia   . HYPERTENSION, BENIGN 08/21/2009   Qualifier: Diagnosis of  By: Caryl Comes, MD, Remus Blake  Medications(s)  enalapril   . Hypothyroidism   . Neoplasm of uncertain behavior    SKIN  . Polymyalgia rheumatica ([place]) 01/13/2007   Qualifier: History of  By: Danny Lawless CMA, [place]       Past Surgical History:  Procedure Laterality Date  . ATRIAL FIBRILLATION ABLATION  2007  . CARDIOVERSION N/A 06/22/2013   Procedure: CARDIOVERSION;  Surgeon: Eber Hong  Turner, MD;  [facility] END[facility];  Service: Cardiovascular;  Laterality: N/A;  15:17 Lido 20mg ,IV , Propofol 30 mg, IV synched cardioversion @ 150 joules by Dr. Lajoyce Lauber from a flutter to sinis brady which is baseline for this pt, reports 45-50 reg HR verified by Dr. Ashok Norris  . CARDIOVERSION N/A 11/21/2014   Procedure: CARDIOVERSION;  Surgeon: Fay Records, MD;  Location: [facility] ENDOSCOPY;  Service: Cardiovascular;  Laterality: N/A;  .  CARDIOVERSION N/A 07/14/2015   Procedure: CARDIOVERSION;  Surgeon: Sueanne Margarita, MD;  Location: [place];  Service: Cardiovascular;  Laterality: N/A;  . CATARACT EXTRACTION, BILATERAL Bilateral Summer 2009  . CHOLECYSTECTOMY    . EP IMPLANTABLE DEVICE N/A 11/10/2015   Procedure: Pacemaker Implant;  Surgeon: [name], MD;  Location: [place] CV LAB;  Service: Cardiovascular;  Laterality: N/A;  . HAND SURGERY Right    "broke bone; grafted area w/bone from somewhere else"  . HEMORRHOID SURGERY    . INGUINAL HERNIA REPAIR Bilateral   . TEE WITHOUT CARDIOVERSION N/A 06/22/2013   Procedure: TRANSESOPHAGEAL ECHOCARDIOGRAM (TEE);  Surgeon: Sueanne Margarita, MD;  Location: [facility] ENDOSCOPY;  Service: Cardiovascular;  Laterality: N/A;  . TEE WITHOUT CARDIOVERSION N/A 07/14/2015   Procedure: TRANSESOPHAGEAL ECHOCARDIOGRAM (TEE);  Surgeon: Sueanne Margarita, MD;  Location: [facility] ENDOSCOPY;  Service: Cardiovascular;  Laterality: N/A;  . TONSILLECTOMY      Family History  Problem Relation Age of Onset  . Emphysema Mother   . Diabetes Mother   . Kidney disease Father     SOCIAL HX: Quit smoking 1965   Current Outpatient Medications:  .  amLODipine (NORVASC) 5 MG tablet, Take 1 tablet (5 mg total) by mouth daily. (Patient taking differently: Take 5 mg by mouth daily with breakfast. ), Disp: 90 tablet, Rfl: 3 .  atorvastatin (LIPITOR) 10 MG tablet, Take 1 tablet (10 mg total) by mouth daily. (Patient taking differently: Take 10 mg by mouth daily with supper. ), Disp: 90 tablet, Rfl: 3 .  b complex vitamins tablet, Take 1 tablet by mouth daily., Disp: , Rfl:  .  Carboxymeth-Glycerin-Polysorb (REFRESH OPTIVE ADVANCED) 0.5-1-0.5 % SOLN, Place 1 drop into both eyes every 4 (four) hours as needed (dry eyes)., Disp: , Rfl:  .  furosemide (LASIX) 20 MG tablet, TAKE 1 TABLET BY MOUTH EVERY DAY (Patient taking differently: Take 20 mg by mouth See admin instructions. Take one tablet (20 mg) by mouth twice weekly  - on Mondays and Fridays), Disp: 90 tablet, Rfl: 0 .  Glucosamine-Chondroitin 500-400 MG CAPS, Take 1 capsule by mouth daily. , Disp: , Rfl:  .  levothyroxine (SYNTHROID, LEVOTHROID) 50 MCG tablet, Take 1 tablet (50 mcg total) by mouth daily. (Patient taking differently: Take 50 mcg by mouth daily before breakfast. ), Disp: 90 tablet, Rfl: 3 .  metoprolol succinate (TOPROL-XL) 50 MG 24 hr tablet, TAKE 1 TABLET BY MOUTH DAILY WITH OR IMMEDIATELY FOLLOWING A MEAL (Patient taking differently: Take 50 mg by mouth daily with supper. ), Disp: 90 tablet, Rfl: 2 .  Multiple Vitamin (MULTIVITAMIN WITH MINERALS) TABS tablet, Take 1 tablet by mouth daily. Centrum, Disp: , Rfl:  .  oxyCODONE-acetaminophen (PERCOCET/ROXICET) 5-325 MG tablet, Take 1 tablet by mouth every 6 (six) hours as needed for up to 5 days., Disp: 20 tablet, Rfl: 0 .  warfarin (COUMADIN) 2 MG tablet, HOLD THIS MEDICATION: neurosurgeon recommended remain off blood thinners for at least 72 hours, preferably 2 weeks if patient can tolerate it., Disp: 175  tablet, Rfl: 0  EXAM:  VITALS per patient if applicable:  GENERAL: alert, oriented, appears well and in no acute distress  HEENT: atraumatic, conjunttiva clear, no obvious abnormalities on inspection of external nose and ears  NECK: normal movements of the head and neck  LUNGS: on inspection no signs of respiratory distress, breathing rate appears normal, no obvious gross SOB, gasping or wheezing  CV: no obvious cyanosis  MS: moves all visible extremities without noticeable abnormality  PSYCH/NEURO: pleasant and cooperative, no obvious depression or anxiety, speech and thought processing grossly intact  ASSESSMENT AND PLAN:  Discussed the following assessment and plan:  Closed fracture of right wrist with routine healing, subsequent encounter - Plan: Ambulatory referral to Orthopedic Surgery, Ambulatory referral to Home Health  Chronic anticoagulation - Plan: Ambulatory referral  to Home Health  Intracranial bleed ([place]) - Plan: Ambulatory referral to Home Health  -I have made referral to home health for PT and nursing assessment.  He has agreed to hold Coumadin through 05/16/2019 and resume at that point.  Will get follow-up INR on 05/23/2019 through home health. -Also made referral to orthopedic surgery for his right wrist fracture follow-up   I discussed the assessment and treatment plan with the patient. The patient was provided an opportunity to ask questions and all were answered. The patient agreed with the plan and demonstrated an understanding of the instructions.   The patient was advised to call back or seek an in-person evaluation if the symptoms worsen or if the condition fails to improve as anticipated.    Carolann Littler, MD

## 2019-05-10 ENCOUNTER — Ambulatory Visit (INDEPENDENT_AMBULATORY_CARE_PROVIDER_SITE_OTHER): Payer: Medicare Other | Admitting: Orthopaedic Surgery

## 2019-05-10 ENCOUNTER — Other Ambulatory Visit: Payer: Self-pay

## 2019-05-10 ENCOUNTER — Telehealth: Payer: Self-pay | Admitting: *Deleted

## 2019-05-10 ENCOUNTER — Encounter: Payer: Self-pay | Admitting: Orthopaedic Surgery

## 2019-05-10 ENCOUNTER — Telehealth: Payer: Self-pay

## 2019-05-10 DIAGNOSIS — S52531A Colles' fracture of right radius, initial encounter for closed fracture: Secondary | ICD-10-CM

## 2019-05-10 NOTE — Telephone Encounter (Signed)
Copied from [place] [id number]. Topic: General - Other >> May 10, 2019  4:15 PM Rainey Pines A wrote: Marga Hoots is requesting a callback in regards to knowing the name of the home health pt patient was referred to. Best contact is Caryl Pina at [phone]

## 2019-05-10 NOTE — Progress Notes (Signed)
Office Visit Note   Patient: [name]           Date of Birth: [birth year]           MRN: [id number] Visit Date: 05/10/2019              Requested by: [name], MD [place],  [place] [address] PCP: [name], MD   Assessment & Plan: Visit Diagnoses:  1. Closed Colles' fracture of right radius, initial encounter     Plan: Fortunately his fracture can be treated just a Velcro removable wrist splint.  I talked to him and his son in detail about how he can remove this for hygiene purposes.  He can put weight through his wrist using a walker.  I have emphasized the importance of him only mobilizing with a walker until his balance of coordination is better.  I agree with him needing home health care.  We will see what we can do an RN to also facilitate that.  All question concerns were answered and addressed.  I would like to see him back in 4 weeks for repeat 2 views of the right wrist.  Follow-Up Instructions: Return in about 4 weeks (around 06/07/2019).   Orders:  No orders of the defined types were placed in this encounter.  No orders of the defined types were placed in this encounter.     Procedures: No procedures performed   Clinical Data: No additional findings.   Subjective: Chief Complaint  Patient presents with  . Right Wrist - Injury  The patient is a 86 year old patient who is actually a neighbor of mine.  I got involved with his care and directly over this past weekend after he had a mechanical fall and neighbors were concerned about this.  They were eventually able to get him to the emergency room.  He did sustain an intra-articular nondisplaced distal radius fracture and was placed appropriately and a right sugar tong splint.  He also twisted his left ankle and did sustain an intracranial hemorrhage from head injury.  His son is with him today.  He has seen his primary care physician Dr. Elease Hashimoto and they are working on  getting him home health care with the nurse and therapy.  His son states that his father is very stubborn and is trying to mobilize quite a bit in spite of his being a fall risk.  He does report right wrist pain and a mild headache.  He is very slow to mobilize.  HPI  Review of Systems He currently denies any chest pain or shortness of breath.  He denies any fever, chills, nausea, vomiting  Objective: Vital Signs: There were no vitals taken for this visit.  Physical Exam He is alert and oriented and in no acute distress and follows commands appropriately Ortho Exam Examination of his left ankle shows no significant acute findings.  Examination of his right wrist shows significant bruising and swelling.  He has swelling in his fingers and thumb.  He is neurovascularly intact with painful range of motion of the wrist. Specialty Comments:  No specialty comments available.  Imaging: No results found. I did independently review x-rays of his pelvis, his cervical spine, his left ankle and his right wrist.  There is an intra-articular nondisplaced distal radius fracture involving the lunate facet area of the wrist.  The remainder the wrist joint is intact and the carpal bones appear normal.  There is no evidence  of fracture on the other films.  PMFS History: Patient Active Problem List   Diagnosis Date Noted  . Right radial fracture 05/07/2019  . Intracranial bleed ([place]) 05/06/2019  . Right wrist fracture 05/06/2019  . BPH (benign prostatic hyperplasia) 10/20/2016  . Sinus node dysfunction ([place]) 11/10/2015  . Atrial fibrillation ([place]) 10/03/2015  . Atrial fibrillation with RVR ([place]) 10/03/2015  . Polycythemia 07/25/2015  . NICM (nonischemic cardiomyopathy) ([place]) 07/04/2015  . Chronic anticoagulation 07/04/2015  . CKD (chronic kidney disease) stage 3, GFR 30-59 ml/min 01/24/2015  . Atypical atrial flutter ([place])   . Encounter for therapeutic drug monitoring 06/26/2013  . Atrial flutter  ([place]) 06/21/2013    Class: Acute  . Acute systolic heart failure ([place]) 06/21/2013  . Hearing loss 06/04/2013  . Routine adult health maintenance 07/30/2012  . Palpitation 07/18/2012  . Stress at home 07/18/2012  . Urinary urgency 03/12/2012  . Hypogonadism male 01/16/2010  . HYPERTENSION, BENIGN 08/21/2009    Class: Chronic  . PAF (paroxysmal atrial fibrillation) ([place]) 01/03/2008  . NEOPLASM OF UNCERTAIN BEHAVIOR OF SKIN 04/05/2007  . Hypothyroidism 01/13/2007  . Hyperlipidemia 01/13/2007  . Polymyalgia rheumatica ([place]) 01/13/2007  . INGUINAL HERNIORRHAPHY, HX OF 01/13/2007   Past Medical History:  Diagnosis Date  . AF (atrial fibrillation) [facility])    a. s/p RFCA/PVI @ [facility] clinic 2007.  . Arthritis    "joints" (06/21/2013)  . Atrial flutter ([place])    a. 06/2013 s/p TEE/DCCV  . Chronic systolic CHF (congestive heart failure) ([place])    a. 06/2013 Echo: EF 20%, diff HK, mild LVH.  Marland Kitchen History of cardiovascular stress test    Lexiscan Myoview 8/16:  EF 59%, attenuation artifact, no ischemia; Low Risk  . Hyperlipidemia   . HYPERTENSION, BENIGN 08/21/2009   Qualifier: Diagnosis of  By: Caryl Comes, MD, Remus Blake  Medications(s)  enalapril   . Hypothyroidism   . Neoplasm of uncertain behavior    SKIN  . Polymyalgia rheumatica ([place]) 01/13/2007   Qualifier: History of  By: Danny Lawless CMA, [place]       Family History  Problem Relation Age of Onset  . Emphysema Mother   . Diabetes Mother   . Kidney disease Father     Past Surgical History:  Procedure Laterality Date  . ATRIAL FIBRILLATION ABLATION  2007  . CARDIOVERSION N/A 06/22/2013   Procedure: CARDIOVERSION;  Surgeon: Sueanne Margarita, MD;  Location: [facility] ENDOSCOPY;  Service: Cardiovascular;  Laterality: N/A;  15:17 Lido 20mg ,IV , Propofol 30 mg, IV synched cardioversion @ 150 joules by Dr. Lajoyce Lauber from a flutter to sinis brady which is baseline for this pt, reports 45-50 reg HR verified by Dr. Ashok Norris  . CARDIOVERSION N/A  11/21/2014   Procedure: CARDIOVERSION;  Surgeon: Fay Records, MD;  Location: [facility] ENDOSCOPY;  Service: Cardiovascular;  Laterality: N/A;  . CARDIOVERSION N/A 07/14/2015   Procedure: CARDIOVERSION;  Surgeon: Sueanne Margarita, MD;  Location: [place];  Service: Cardiovascular;  Laterality: N/A;  . CATARACT EXTRACTION, BILATERAL Bilateral Summer 2009  . CHOLECYSTECTOMY    . EP IMPLANTABLE DEVICE N/A 11/10/2015   Procedure: Pacemaker Implant;  Surgeon: [name], MD;  Location: [place] CV LAB;  Service: Cardiovascular;  Laterality: N/A;  . HAND SURGERY Right    "broke bone; grafted area w/bone from somewhere else"  . HEMORRHOID SURGERY    . INGUINAL HERNIA REPAIR Bilateral   . TEE WITHOUT CARDIOVERSION N/A 06/22/2013   Procedure: TRANSESOPHAGEAL ECHOCARDIOGRAM (TEE);  Surgeon: Eber Hong  Radford Pax, MD;  Location: [place];  Service: Cardiovascular;  Laterality: N/A;  . TEE WITHOUT CARDIOVERSION N/A 07/14/2015   Procedure: TRANSESOPHAGEAL ECHOCARDIOGRAM (TEE);  Surgeon: Sueanne Margarita, MD;  Location: [facility] ENDOSCOPY;  Service: Cardiovascular;  Laterality: N/A;  . TONSILLECTOMY     Social History   Occupational History  . Occupation: RETIRED Administrator, sports: RETIRED    Comment: Still active w/stocks  . Occupation: RETIRED    Employer: RETIRED  Tobacco Use  . Smoking status: Former Smoker    Packs/day: 1.00    Years: 12.00    Pack years: 12.00    Types: Cigarettes    Quit date: 05/04/1963    Years since quitting: 56.0  . Smokeless tobacco: Never Used  . Tobacco comment: quit cigars 4'   Substance and Sexual Activity  . Alcohol use: Yes    Comment: 06/21/2013 "I was a moderate drinker at most; rarely have drink now"  . Drug use: No  . Sexual activity: Never    Partners: Female

## 2019-05-10 NOTE — Telephone Encounter (Signed)
Called Dr. [name] Burchette's office as Dr Ninfa Linden requested. Would like to know  Which agency he set up [facility] with. They will call us back to let us know which [place].      CB# [phone]

## 2019-05-11 NOTE — Telephone Encounter (Signed)
Marga Hoots needs to have information release forms filled out to obtain this information.

## 2019-05-14 DIAGNOSIS — M353 Polymyalgia rheumatica: Secondary | ICD-10-CM | POA: Diagnosis not present

## 2019-05-14 DIAGNOSIS — N183 Chronic kidney disease, stage 3 unspecified: Secondary | ICD-10-CM | POA: Diagnosis not present

## 2019-05-14 DIAGNOSIS — S52571D Other intraarticular fracture of lower end of right radius, subsequent encounter for closed fracture with routine healing: Secondary | ICD-10-CM | POA: Diagnosis not present

## 2019-05-14 DIAGNOSIS — S06319D Contusion and laceration of right cerebrum with loss of consciousness of unspecified duration, subsequent encounter: Secondary | ICD-10-CM | POA: Diagnosis not present

## 2019-05-14 DIAGNOSIS — I5022 Chronic systolic (congestive) heart failure: Secondary | ICD-10-CM | POA: Diagnosis not present

## 2019-05-14 DIAGNOSIS — E039 Hypothyroidism, unspecified: Secondary | ICD-10-CM | POA: Diagnosis not present

## 2019-05-14 DIAGNOSIS — I480 Paroxysmal atrial fibrillation: Secondary | ICD-10-CM | POA: Diagnosis not present

## 2019-05-14 DIAGNOSIS — I130 Hypertensive heart and chronic kidney disease with heart failure and stage 1 through stage 4 chronic kidney disease, or unspecified chronic kidney disease: Secondary | ICD-10-CM | POA: Diagnosis not present

## 2019-05-14 DIAGNOSIS — I428 Other cardiomyopathies: Secondary | ICD-10-CM | POA: Diagnosis not present

## 2019-05-14 DIAGNOSIS — W108XXD Fall (on) (from) other stairs and steps, subsequent encounter: Secondary | ICD-10-CM | POA: Diagnosis not present

## 2019-05-14 DIAGNOSIS — Z950 Presence of cardiac pacemaker: Secondary | ICD-10-CM | POA: Diagnosis not present

## 2019-05-14 DIAGNOSIS — Z87891 Personal history of nicotine dependence: Secondary | ICD-10-CM | POA: Diagnosis not present

## 2019-05-14 DIAGNOSIS — H9190 Unspecified hearing loss, unspecified ear: Secondary | ICD-10-CM | POA: Diagnosis not present

## 2019-05-14 DIAGNOSIS — Z5181 Encounter for therapeutic drug level monitoring: Secondary | ICD-10-CM | POA: Diagnosis not present

## 2019-05-14 DIAGNOSIS — Z9181 History of falling: Secondary | ICD-10-CM | POA: Diagnosis not present

## 2019-05-14 DIAGNOSIS — E785 Hyperlipidemia, unspecified: Secondary | ICD-10-CM | POA: Diagnosis not present

## 2019-05-14 DIAGNOSIS — Z7901 Long term (current) use of anticoagulants: Secondary | ICD-10-CM | POA: Diagnosis not present

## 2019-05-15 ENCOUNTER — Telehealth: Payer: Self-pay | Admitting: Family Medicine

## 2019-05-15 DIAGNOSIS — W108XXD Fall (on) (from) other stairs and steps, subsequent encounter: Secondary | ICD-10-CM | POA: Diagnosis not present

## 2019-05-15 DIAGNOSIS — S06319D Contusion and laceration of right cerebrum with loss of consciousness of unspecified duration, subsequent encounter: Secondary | ICD-10-CM | POA: Diagnosis not present

## 2019-05-15 DIAGNOSIS — S52571D Other intraarticular fracture of lower end of right radius, subsequent encounter for closed fracture with routine healing: Secondary | ICD-10-CM | POA: Diagnosis not present

## 2019-05-15 DIAGNOSIS — N183 Chronic kidney disease, stage 3 unspecified: Secondary | ICD-10-CM | POA: Diagnosis not present

## 2019-05-15 DIAGNOSIS — I5022 Chronic systolic (congestive) heart failure: Secondary | ICD-10-CM | POA: Diagnosis not present

## 2019-05-15 DIAGNOSIS — I130 Hypertensive heart and chronic kidney disease with heart failure and stage 1 through stage 4 chronic kidney disease, or unspecified chronic kidney disease: Secondary | ICD-10-CM | POA: Diagnosis not present

## 2019-05-15 NOTE — Telephone Encounter (Signed)
Home Health Verbal Orders - Caller/Agency: Monique with [facility] Number: [phone], OK to leave message Requesting:  PT Frequency:  2x a week for 4 weeks then 1x a week for 2 weeks for strengthening, gait/balance training, home exercise program, and home safety  Requesting:  Home health Frequency:  Evaluation and treatment  Requesting:  OT Frequency:  Evaluation and treatment for ADL and equipment acquition

## 2019-05-15 NOTE — Telephone Encounter (Signed)
ok 

## 2019-05-15 NOTE — Telephone Encounter (Signed)
Okay for verbal orders. 

## 2019-05-16 NOTE — Telephone Encounter (Signed)
Spoke with [facility] and gave ok for verbal orders per Dr. Elease Hashimoto.

## 2019-05-17 ENCOUNTER — Telehealth: Payer: Self-pay | Admitting: Family Medicine

## 2019-05-17 DIAGNOSIS — I130 Hypertensive heart and chronic kidney disease with heart failure and stage 1 through stage 4 chronic kidney disease, or unspecified chronic kidney disease: Secondary | ICD-10-CM | POA: Diagnosis not present

## 2019-05-17 DIAGNOSIS — W108XXD Fall (on) (from) other stairs and steps, subsequent encounter: Secondary | ICD-10-CM | POA: Diagnosis not present

## 2019-05-17 DIAGNOSIS — S06319D Contusion and laceration of right cerebrum with loss of consciousness of unspecified duration, subsequent encounter: Secondary | ICD-10-CM | POA: Diagnosis not present

## 2019-05-17 DIAGNOSIS — I5022 Chronic systolic (congestive) heart failure: Secondary | ICD-10-CM | POA: Diagnosis not present

## 2019-05-17 DIAGNOSIS — N183 Chronic kidney disease, stage 3 unspecified: Secondary | ICD-10-CM | POA: Diagnosis not present

## 2019-05-17 DIAGNOSIS — S52571D Other intraarticular fracture of lower end of right radius, subsequent encounter for closed fracture with routine healing: Secondary | ICD-10-CM | POA: Diagnosis not present

## 2019-05-17 NOTE — Telephone Encounter (Signed)
Sharyn Lull with [facility] called to say she complete his OT eval land wants to give orders for his OT   1x1 week 2x 3 weeks 1x  2 week  [phone]

## 2019-05-17 NOTE — Telephone Encounter (Signed)
Message Routed to PCP CMA 

## 2019-05-18 DIAGNOSIS — I5022 Chronic systolic (congestive) heart failure: Secondary | ICD-10-CM | POA: Diagnosis not present

## 2019-05-18 DIAGNOSIS — W108XXD Fall (on) (from) other stairs and steps, subsequent encounter: Secondary | ICD-10-CM | POA: Diagnosis not present

## 2019-05-18 DIAGNOSIS — N183 Chronic kidney disease, stage 3 unspecified: Secondary | ICD-10-CM | POA: Diagnosis not present

## 2019-05-18 DIAGNOSIS — S06319D Contusion and laceration of right cerebrum with loss of consciousness of unspecified duration, subsequent encounter: Secondary | ICD-10-CM | POA: Diagnosis not present

## 2019-05-18 DIAGNOSIS — I130 Hypertensive heart and chronic kidney disease with heart failure and stage 1 through stage 4 chronic kidney disease, or unspecified chronic kidney disease: Secondary | ICD-10-CM | POA: Diagnosis not present

## 2019-05-18 DIAGNOSIS — S52571D Other intraarticular fracture of lower end of right radius, subsequent encounter for closed fracture with routine healing: Secondary | ICD-10-CM | POA: Diagnosis not present

## 2019-05-18 NOTE — Telephone Encounter (Signed)
Please see message. °

## 2019-05-18 NOTE — Telephone Encounter (Signed)
OK 

## 2019-05-18 NOTE — Telephone Encounter (Signed)
Called [place] with [facility] and gave her the verbal OK per Dr. Elease Hashimoto. Sharyn Lull verbalized an understanding.

## 2019-05-19 NOTE — Progress Notes (Signed)
PPM remote 

## 2019-05-21 DIAGNOSIS — S06319D Contusion and laceration of right cerebrum with loss of consciousness of unspecified duration, subsequent encounter: Secondary | ICD-10-CM | POA: Diagnosis not present

## 2019-05-21 DIAGNOSIS — I5022 Chronic systolic (congestive) heart failure: Secondary | ICD-10-CM | POA: Diagnosis not present

## 2019-05-21 DIAGNOSIS — S52571D Other intraarticular fracture of lower end of right radius, subsequent encounter for closed fracture with routine healing: Secondary | ICD-10-CM | POA: Diagnosis not present

## 2019-05-21 DIAGNOSIS — N183 Chronic kidney disease, stage 3 unspecified: Secondary | ICD-10-CM | POA: Diagnosis not present

## 2019-05-21 DIAGNOSIS — W108XXD Fall (on) (from) other stairs and steps, subsequent encounter: Secondary | ICD-10-CM | POA: Diagnosis not present

## 2019-05-21 DIAGNOSIS — I130 Hypertensive heart and chronic kidney disease with heart failure and stage 1 through stage 4 chronic kidney disease, or unspecified chronic kidney disease: Secondary | ICD-10-CM | POA: Diagnosis not present

## 2019-05-22 ENCOUNTER — Telehealth: Payer: Self-pay | Admitting: Family Medicine

## 2019-05-22 DIAGNOSIS — I130 Hypertensive heart and chronic kidney disease with heart failure and stage 1 through stage 4 chronic kidney disease, or unspecified chronic kidney disease: Secondary | ICD-10-CM | POA: Diagnosis not present

## 2019-05-22 DIAGNOSIS — W108XXD Fall (on) (from) other stairs and steps, subsequent encounter: Secondary | ICD-10-CM | POA: Diagnosis not present

## 2019-05-22 DIAGNOSIS — S52571D Other intraarticular fracture of lower end of right radius, subsequent encounter for closed fracture with routine healing: Secondary | ICD-10-CM | POA: Diagnosis not present

## 2019-05-22 DIAGNOSIS — N183 Chronic kidney disease, stage 3 unspecified: Secondary | ICD-10-CM | POA: Diagnosis not present

## 2019-05-22 DIAGNOSIS — S06319D Contusion and laceration of right cerebrum with loss of consciousness of unspecified duration, subsequent encounter: Secondary | ICD-10-CM | POA: Diagnosis not present

## 2019-05-22 DIAGNOSIS — I5022 Chronic systolic (congestive) heart failure: Secondary | ICD-10-CM | POA: Diagnosis not present

## 2019-05-22 NOTE — Telephone Encounter (Signed)
Need more history.  Is he having any increased peripheral edema, pleuritic pain, cough, etc?   We may need to set up Doxy follow-up to reassess.

## 2019-05-22 NOTE — Telephone Encounter (Signed)
Verita Lamb and gave her verbal OK per Dr. Elease Hashimoto. Sharyn Lull verbalized an understanding.

## 2019-05-22 NOTE — Telephone Encounter (Signed)
Called Monique at [facility] to return call  [place] for [facility] to Discuss results / PCP / recommendations / Schedule patient  Per Dr. Elease Hashimoto: Need more history.  Is he having any increased peripheral edema, pleuritic pain, cough, etc?   We may need to set up Doxy follow-up to reassess.  CRM Created.

## 2019-05-22 NOTE — Telephone Encounter (Signed)
[name] called to inform pcp of out of range oxygen of 80% after PT and it took 7 minutes for it to get back to 90/ please advise

## 2019-05-22 NOTE — Telephone Encounter (Signed)
[name] was scheduled to see Pt today for OT but Pt had an appt to get covid vaccine and asked if visit can be moved to the end of the plan of care/ [name] needs verbal orders for this/ please advise

## 2019-05-22 NOTE — Telephone Encounter (Signed)
Please see message. °

## 2019-05-22 NOTE — Telephone Encounter (Signed)
OK 

## 2019-05-22 NOTE — Telephone Encounter (Signed)
Please advise 

## 2019-05-24 ENCOUNTER — Telehealth: Payer: Self-pay | Admitting: Internal Medicine

## 2019-05-24 DIAGNOSIS — S52571D Other intraarticular fracture of lower end of right radius, subsequent encounter for closed fracture with routine healing: Secondary | ICD-10-CM | POA: Diagnosis not present

## 2019-05-24 DIAGNOSIS — I130 Hypertensive heart and chronic kidney disease with heart failure and stage 1 through stage 4 chronic kidney disease, or unspecified chronic kidney disease: Secondary | ICD-10-CM | POA: Diagnosis not present

## 2019-05-24 DIAGNOSIS — I5022 Chronic systolic (congestive) heart failure: Secondary | ICD-10-CM | POA: Diagnosis not present

## 2019-05-24 DIAGNOSIS — W108XXD Fall (on) (from) other stairs and steps, subsequent encounter: Secondary | ICD-10-CM | POA: Diagnosis not present

## 2019-05-24 DIAGNOSIS — N183 Chronic kidney disease, stage 3 unspecified: Secondary | ICD-10-CM | POA: Diagnosis not present

## 2019-05-24 DIAGNOSIS — S06319D Contusion and laceration of right cerebrum with loss of consciousness of unspecified duration, subsequent encounter: Secondary | ICD-10-CM | POA: Diagnosis not present

## 2019-05-24 NOTE — Telephone Encounter (Signed)
Kim From [facility] calling stating they can check the patient's INR so he would not need his appointment 2/11. She would like a call back for the order.

## 2019-05-24 NOTE — Telephone Encounter (Signed)
Returned call to [employer]. Provided her with verbal order to recheck INR - pt was due for next check on 2/11 in office which has been canceled. She states she is going out to see him on 2/10, advised she can check INR then. Provided her with direct # to Coumadin clinic to call the result to [place].

## 2019-05-25 ENCOUNTER — Telehealth: Payer: Self-pay | Admitting: Family Medicine

## 2019-05-25 DIAGNOSIS — I5022 Chronic systolic (congestive) heart failure: Secondary | ICD-10-CM | POA: Diagnosis not present

## 2019-05-25 DIAGNOSIS — S52571D Other intraarticular fracture of lower end of right radius, subsequent encounter for closed fracture with routine healing: Secondary | ICD-10-CM | POA: Diagnosis not present

## 2019-05-25 DIAGNOSIS — S06319D Contusion and laceration of right cerebrum with loss of consciousness of unspecified duration, subsequent encounter: Secondary | ICD-10-CM | POA: Diagnosis not present

## 2019-05-25 DIAGNOSIS — W108XXD Fall (on) (from) other stairs and steps, subsequent encounter: Secondary | ICD-10-CM | POA: Diagnosis not present

## 2019-05-25 DIAGNOSIS — N183 Chronic kidney disease, stage 3 unspecified: Secondary | ICD-10-CM | POA: Diagnosis not present

## 2019-05-25 DIAGNOSIS — I130 Hypertensive heart and chronic kidney disease with heart failure and stage 1 through stage 4 chronic kidney disease, or unspecified chronic kidney disease: Secondary | ICD-10-CM | POA: Diagnosis not present

## 2019-05-25 NOTE — Telephone Encounter (Signed)
Returned call to Mrs. Ovid Curd and informed her of Dr. Erick Blinks advice about patient's blood pressure.

## 2019-05-25 NOTE — Telephone Encounter (Signed)
Dionka Ovid Curd called in on behalf of [name] to report his blood pressure of 168/92 If you have any questions or concerns please contact Mack Guise at [phone]

## 2019-05-25 NOTE — Telephone Encounter (Signed)
Would monitor over the next few days.  If consistent systolic readings greater than 140 by next week be back in touch

## 2019-05-25 NOTE — Telephone Encounter (Signed)
See note

## 2019-05-25 NOTE — Telephone Encounter (Signed)
Message Routed to PCP CMA 

## 2019-05-28 DIAGNOSIS — S52571D Other intraarticular fracture of lower end of right radius, subsequent encounter for closed fracture with routine healing: Secondary | ICD-10-CM | POA: Diagnosis not present

## 2019-05-28 DIAGNOSIS — W108XXD Fall (on) (from) other stairs and steps, subsequent encounter: Secondary | ICD-10-CM | POA: Diagnosis not present

## 2019-05-28 DIAGNOSIS — I5022 Chronic systolic (congestive) heart failure: Secondary | ICD-10-CM | POA: Diagnosis not present

## 2019-05-28 DIAGNOSIS — S06319D Contusion and laceration of right cerebrum with loss of consciousness of unspecified duration, subsequent encounter: Secondary | ICD-10-CM | POA: Diagnosis not present

## 2019-05-28 DIAGNOSIS — N183 Chronic kidney disease, stage 3 unspecified: Secondary | ICD-10-CM | POA: Diagnosis not present

## 2019-05-28 DIAGNOSIS — I130 Hypertensive heart and chronic kidney disease with heart failure and stage 1 through stage 4 chronic kidney disease, or unspecified chronic kidney disease: Secondary | ICD-10-CM | POA: Diagnosis not present

## 2019-05-29 DIAGNOSIS — I130 Hypertensive heart and chronic kidney disease with heart failure and stage 1 through stage 4 chronic kidney disease, or unspecified chronic kidney disease: Secondary | ICD-10-CM | POA: Diagnosis not present

## 2019-05-29 DIAGNOSIS — W108XXD Fall (on) (from) other stairs and steps, subsequent encounter: Secondary | ICD-10-CM | POA: Diagnosis not present

## 2019-05-29 DIAGNOSIS — N183 Chronic kidney disease, stage 3 unspecified: Secondary | ICD-10-CM | POA: Diagnosis not present

## 2019-05-29 DIAGNOSIS — S52571D Other intraarticular fracture of lower end of right radius, subsequent encounter for closed fracture with routine healing: Secondary | ICD-10-CM | POA: Diagnosis not present

## 2019-05-29 DIAGNOSIS — I5022 Chronic systolic (congestive) heart failure: Secondary | ICD-10-CM | POA: Diagnosis not present

## 2019-05-29 DIAGNOSIS — S06319D Contusion and laceration of right cerebrum with loss of consciousness of unspecified duration, subsequent encounter: Secondary | ICD-10-CM | POA: Diagnosis not present

## 2019-05-31 DIAGNOSIS — S06319D Contusion and laceration of right cerebrum with loss of consciousness of unspecified duration, subsequent encounter: Secondary | ICD-10-CM | POA: Diagnosis not present

## 2019-05-31 DIAGNOSIS — I130 Hypertensive heart and chronic kidney disease with heart failure and stage 1 through stage 4 chronic kidney disease, or unspecified chronic kidney disease: Secondary | ICD-10-CM | POA: Diagnosis not present

## 2019-05-31 DIAGNOSIS — W108XXD Fall (on) (from) other stairs and steps, subsequent encounter: Secondary | ICD-10-CM | POA: Diagnosis not present

## 2019-05-31 DIAGNOSIS — S52571D Other intraarticular fracture of lower end of right radius, subsequent encounter for closed fracture with routine healing: Secondary | ICD-10-CM | POA: Diagnosis not present

## 2019-05-31 DIAGNOSIS — I5022 Chronic systolic (congestive) heart failure: Secondary | ICD-10-CM | POA: Diagnosis not present

## 2019-05-31 DIAGNOSIS — N183 Chronic kidney disease, stage 3 unspecified: Secondary | ICD-10-CM | POA: Diagnosis not present

## 2019-06-01 DIAGNOSIS — I130 Hypertensive heart and chronic kidney disease with heart failure and stage 1 through stage 4 chronic kidney disease, or unspecified chronic kidney disease: Secondary | ICD-10-CM | POA: Diagnosis not present

## 2019-06-01 DIAGNOSIS — N183 Chronic kidney disease, stage 3 unspecified: Secondary | ICD-10-CM | POA: Diagnosis not present

## 2019-06-01 DIAGNOSIS — I5022 Chronic systolic (congestive) heart failure: Secondary | ICD-10-CM | POA: Diagnosis not present

## 2019-06-01 DIAGNOSIS — W108XXD Fall (on) (from) other stairs and steps, subsequent encounter: Secondary | ICD-10-CM | POA: Diagnosis not present

## 2019-06-01 DIAGNOSIS — S06319D Contusion and laceration of right cerebrum with loss of consciousness of unspecified duration, subsequent encounter: Secondary | ICD-10-CM | POA: Diagnosis not present

## 2019-06-01 DIAGNOSIS — S52571D Other intraarticular fracture of lower end of right radius, subsequent encounter for closed fracture with routine healing: Secondary | ICD-10-CM | POA: Diagnosis not present

## 2019-06-04 DIAGNOSIS — N183 Chronic kidney disease, stage 3 unspecified: Secondary | ICD-10-CM | POA: Diagnosis not present

## 2019-06-04 DIAGNOSIS — S52571D Other intraarticular fracture of lower end of right radius, subsequent encounter for closed fracture with routine healing: Secondary | ICD-10-CM | POA: Diagnosis not present

## 2019-06-04 DIAGNOSIS — I5022 Chronic systolic (congestive) heart failure: Secondary | ICD-10-CM | POA: Diagnosis not present

## 2019-06-04 DIAGNOSIS — I130 Hypertensive heart and chronic kidney disease with heart failure and stage 1 through stage 4 chronic kidney disease, or unspecified chronic kidney disease: Secondary | ICD-10-CM | POA: Diagnosis not present

## 2019-06-04 DIAGNOSIS — S06319D Contusion and laceration of right cerebrum with loss of consciousness of unspecified duration, subsequent encounter: Secondary | ICD-10-CM | POA: Diagnosis not present

## 2019-06-04 DIAGNOSIS — W108XXD Fall (on) (from) other stairs and steps, subsequent encounter: Secondary | ICD-10-CM | POA: Diagnosis not present

## 2019-06-05 DIAGNOSIS — I130 Hypertensive heart and chronic kidney disease with heart failure and stage 1 through stage 4 chronic kidney disease, or unspecified chronic kidney disease: Secondary | ICD-10-CM | POA: Diagnosis not present

## 2019-06-05 DIAGNOSIS — W108XXD Fall (on) (from) other stairs and steps, subsequent encounter: Secondary | ICD-10-CM | POA: Diagnosis not present

## 2019-06-05 DIAGNOSIS — S06319D Contusion and laceration of right cerebrum with loss of consciousness of unspecified duration, subsequent encounter: Secondary | ICD-10-CM | POA: Diagnosis not present

## 2019-06-05 DIAGNOSIS — N183 Chronic kidney disease, stage 3 unspecified: Secondary | ICD-10-CM | POA: Diagnosis not present

## 2019-06-05 DIAGNOSIS — I5022 Chronic systolic (congestive) heart failure: Secondary | ICD-10-CM | POA: Diagnosis not present

## 2019-06-05 DIAGNOSIS — S52571D Other intraarticular fracture of lower end of right radius, subsequent encounter for closed fracture with routine healing: Secondary | ICD-10-CM | POA: Diagnosis not present

## 2019-06-07 ENCOUNTER — Ambulatory Visit (INDEPENDENT_AMBULATORY_CARE_PROVIDER_SITE_OTHER): Payer: Medicare Other

## 2019-06-07 ENCOUNTER — Encounter: Payer: Self-pay | Admitting: Orthopaedic Surgery

## 2019-06-07 ENCOUNTER — Ambulatory Visit (INDEPENDENT_AMBULATORY_CARE_PROVIDER_SITE_OTHER): Payer: Medicare Other | Admitting: Orthopaedic Surgery

## 2019-06-07 ENCOUNTER — Other Ambulatory Visit: Payer: Self-pay

## 2019-06-07 DIAGNOSIS — I5022 Chronic systolic (congestive) heart failure: Secondary | ICD-10-CM | POA: Diagnosis not present

## 2019-06-07 DIAGNOSIS — S52571D Other intraarticular fracture of lower end of right radius, subsequent encounter for closed fracture with routine healing: Secondary | ICD-10-CM | POA: Diagnosis not present

## 2019-06-07 DIAGNOSIS — S06319D Contusion and laceration of right cerebrum with loss of consciousness of unspecified duration, subsequent encounter: Secondary | ICD-10-CM | POA: Diagnosis not present

## 2019-06-07 DIAGNOSIS — W108XXD Fall (on) (from) other stairs and steps, subsequent encounter: Secondary | ICD-10-CM | POA: Diagnosis not present

## 2019-06-07 DIAGNOSIS — S52531A Colles' fracture of right radius, initial encounter for closed fracture: Secondary | ICD-10-CM

## 2019-06-07 DIAGNOSIS — I130 Hypertensive heart and chronic kidney disease with heart failure and stage 1 through stage 4 chronic kidney disease, or unspecified chronic kidney disease: Secondary | ICD-10-CM | POA: Diagnosis not present

## 2019-06-07 DIAGNOSIS — N183 Chronic kidney disease, stage 3 unspecified: Secondary | ICD-10-CM | POA: Diagnosis not present

## 2019-06-07 NOTE — Progress Notes (Signed)
The patient is a very pleasant and active 86 year old gentleman who sustained a right distal radius nondisplaced fracture about a month ago.  This was after a mechanical fall.  He reports improved motion with his right wrist.  He denies any pain.  We have had him in a Velcro wrist splint.  He is back to doing his regular exercise routine except for push-ups.  On exam he has significant improvements in range of motion of his right wrist with minimal stiffness.  There is no swelling.  There is no pain when I stress his wrist.  2 views of the right distal radius show interval healing of a nondisplaced distal radius fracture.  There is an intra-articular component but it is nondisplaced.  At this point since he is doing so well follow-up will be as needed.  He will wear the splint during exercise activities for the next 4 weeks and then he can stop the splint.  All question concerns were answered and addressed.  If there is any issues at all he will let us know.

## 2019-06-08 DIAGNOSIS — S52571D Other intraarticular fracture of lower end of right radius, subsequent encounter for closed fracture with routine healing: Secondary | ICD-10-CM | POA: Diagnosis not present

## 2019-06-08 DIAGNOSIS — S06319D Contusion and laceration of right cerebrum with loss of consciousness of unspecified duration, subsequent encounter: Secondary | ICD-10-CM | POA: Diagnosis not present

## 2019-06-08 DIAGNOSIS — I130 Hypertensive heart and chronic kidney disease with heart failure and stage 1 through stage 4 chronic kidney disease, or unspecified chronic kidney disease: Secondary | ICD-10-CM | POA: Diagnosis not present

## 2019-06-08 DIAGNOSIS — I5022 Chronic systolic (congestive) heart failure: Secondary | ICD-10-CM | POA: Diagnosis not present

## 2019-06-08 DIAGNOSIS — N183 Chronic kidney disease, stage 3 unspecified: Secondary | ICD-10-CM | POA: Diagnosis not present

## 2019-06-08 DIAGNOSIS — W108XXD Fall (on) (from) other stairs and steps, subsequent encounter: Secondary | ICD-10-CM | POA: Diagnosis not present

## 2019-06-11 DIAGNOSIS — S52571D Other intraarticular fracture of lower end of right radius, subsequent encounter for closed fracture with routine healing: Secondary | ICD-10-CM | POA: Diagnosis not present

## 2019-06-11 DIAGNOSIS — N183 Chronic kidney disease, stage 3 unspecified: Secondary | ICD-10-CM | POA: Diagnosis not present

## 2019-06-11 DIAGNOSIS — I130 Hypertensive heart and chronic kidney disease with heart failure and stage 1 through stage 4 chronic kidney disease, or unspecified chronic kidney disease: Secondary | ICD-10-CM | POA: Diagnosis not present

## 2019-06-11 DIAGNOSIS — I5022 Chronic systolic (congestive) heart failure: Secondary | ICD-10-CM | POA: Diagnosis not present

## 2019-06-11 DIAGNOSIS — S06319D Contusion and laceration of right cerebrum with loss of consciousness of unspecified duration, subsequent encounter: Secondary | ICD-10-CM | POA: Diagnosis not present

## 2019-06-11 DIAGNOSIS — W108XXD Fall (on) (from) other stairs and steps, subsequent encounter: Secondary | ICD-10-CM | POA: Diagnosis not present

## 2019-06-13 ENCOUNTER — Ambulatory Visit (INDEPENDENT_AMBULATORY_CARE_PROVIDER_SITE_OTHER): Payer: Medicare Other | Admitting: Cardiovascular Disease

## 2019-06-13 DIAGNOSIS — I428 Other cardiomyopathies: Secondary | ICD-10-CM | POA: Diagnosis not present

## 2019-06-13 DIAGNOSIS — Z7901 Long term (current) use of anticoagulants: Secondary | ICD-10-CM | POA: Diagnosis not present

## 2019-06-13 DIAGNOSIS — I130 Hypertensive heart and chronic kidney disease with heart failure and stage 1 through stage 4 chronic kidney disease, or unspecified chronic kidney disease: Secondary | ICD-10-CM | POA: Diagnosis not present

## 2019-06-13 DIAGNOSIS — S06319D Contusion and laceration of right cerebrum with loss of consciousness of unspecified duration, subsequent encounter: Secondary | ICD-10-CM | POA: Diagnosis not present

## 2019-06-13 DIAGNOSIS — H9190 Unspecified hearing loss, unspecified ear: Secondary | ICD-10-CM | POA: Diagnosis not present

## 2019-06-13 DIAGNOSIS — E785 Hyperlipidemia, unspecified: Secondary | ICD-10-CM | POA: Diagnosis not present

## 2019-06-13 DIAGNOSIS — Z87891 Personal history of nicotine dependence: Secondary | ICD-10-CM | POA: Diagnosis not present

## 2019-06-13 DIAGNOSIS — S52571D Other intraarticular fracture of lower end of right radius, subsequent encounter for closed fracture with routine healing: Secondary | ICD-10-CM | POA: Diagnosis not present

## 2019-06-13 DIAGNOSIS — Z950 Presence of cardiac pacemaker: Secondary | ICD-10-CM | POA: Diagnosis not present

## 2019-06-13 DIAGNOSIS — M353 Polymyalgia rheumatica: Secondary | ICD-10-CM | POA: Diagnosis not present

## 2019-06-13 DIAGNOSIS — Z9181 History of falling: Secondary | ICD-10-CM | POA: Diagnosis not present

## 2019-06-13 DIAGNOSIS — W108XXD Fall (on) (from) other stairs and steps, subsequent encounter: Secondary | ICD-10-CM | POA: Diagnosis not present

## 2019-06-13 DIAGNOSIS — I5022 Chronic systolic (congestive) heart failure: Secondary | ICD-10-CM | POA: Diagnosis not present

## 2019-06-13 DIAGNOSIS — I480 Paroxysmal atrial fibrillation: Secondary | ICD-10-CM | POA: Diagnosis not present

## 2019-06-13 DIAGNOSIS — Z5181 Encounter for therapeutic drug level monitoring: Secondary | ICD-10-CM

## 2019-06-13 DIAGNOSIS — N183 Chronic kidney disease, stage 3 unspecified: Secondary | ICD-10-CM | POA: Diagnosis not present

## 2019-06-13 DIAGNOSIS — E039 Hypothyroidism, unspecified: Secondary | ICD-10-CM | POA: Diagnosis not present

## 2019-06-13 LAB — POCT INR: INR: 2.3 (ref 2.0–3.0)

## 2019-06-13 NOTE — Patient Instructions (Addendum)
Description   Joelene Millin from [facility], called and reported pt's INR instructed for pt to continue same dosage 2 tablets everyday except 1.5 tablets on Mondays and Fridays. Recheck INR in 6 weeks- pt will no longer be with [facility]  services. Call with any changes or new medications [phone].

## 2019-06-13 NOTE — Progress Notes (Signed)
Called and spoke to pt and scheduled him an appointment on July 25, 2019.

## 2019-06-20 ENCOUNTER — Telehealth: Payer: Self-pay | Admitting: Family Medicine

## 2019-06-20 NOTE — Telephone Encounter (Signed)
Forms placed in red folder on desk. Please return when complete.

## 2019-06-20 NOTE — Telephone Encounter (Signed)
Pt's wife, Zepplin Misak, dropped off [place] forms to be completed by Dr. Elease Hashimoto. Upon completion pt would like forms to be faxed to Attn: Leroy Sea, fax # [phone]. Forms placed in Dr. Erick Blinks folder. Thanks

## 2019-06-26 DIAGNOSIS — I5022 Chronic systolic (congestive) heart failure: Secondary | ICD-10-CM | POA: Diagnosis not present

## 2019-06-26 DIAGNOSIS — I130 Hypertensive heart and chronic kidney disease with heart failure and stage 1 through stage 4 chronic kidney disease, or unspecified chronic kidney disease: Secondary | ICD-10-CM | POA: Diagnosis not present

## 2019-06-26 DIAGNOSIS — N183 Chronic kidney disease, stage 3 unspecified: Secondary | ICD-10-CM | POA: Diagnosis not present

## 2019-06-26 DIAGNOSIS — W108XXD Fall (on) (from) other stairs and steps, subsequent encounter: Secondary | ICD-10-CM | POA: Diagnosis not present

## 2019-06-26 DIAGNOSIS — S06319D Contusion and laceration of right cerebrum with loss of consciousness of unspecified duration, subsequent encounter: Secondary | ICD-10-CM | POA: Diagnosis not present

## 2019-06-26 DIAGNOSIS — S52571D Other intraarticular fracture of lower end of right radius, subsequent encounter for closed fracture with routine healing: Secondary | ICD-10-CM | POA: Diagnosis not present

## 2019-06-26 NOTE — Telephone Encounter (Signed)
Patient stated that the forms was received by [facility]

## 2019-07-05 ENCOUNTER — Other Ambulatory Visit: Payer: Self-pay | Admitting: *Deleted

## 2019-07-05 MED ORDER — ATORVASTATIN CALCIUM 10 MG PO TABS: 10.0000 mg | ORAL_TABLET | Freq: Every day | ORAL | 3 refills | Status: DC

## 2019-07-17 ENCOUNTER — Ambulatory Visit (INDEPENDENT_AMBULATORY_CARE_PROVIDER_SITE_OTHER): Payer: Medicare Other | Admitting: *Deleted

## 2019-07-17 DIAGNOSIS — I495 Sick sinus syndrome: Secondary | ICD-10-CM | POA: Diagnosis not present

## 2019-07-17 LAB — CUP PACEART REMOTE DEVICE CHECK
Battery Remaining Longevity: 75 mo
Battery Voltage: 3.01 V
Brady Statistic AP VP Percent: 0.01 %
Brady Statistic AP VS Percent: 88.72 %
Brady Statistic AS VP Percent: 0 %
Brady Statistic AS VS Percent: 11.27 %
Brady Statistic RA Percent Paced: 88.62 %
Brady Statistic RV Percent Paced: 0.02 %
Date Time Interrogation Session: 20210316111331
Implantable Lead Implant Date: 20170710
Implantable Lead Implant Date: 20170710
Implantable Lead Location: 753859
Implantable Lead Location: 753860
Implantable Lead Model: 3830
Implantable Lead Model: 5076
Implantable Pulse Generator Implant Date: 20170710
Lead Channel Impedance Value: 304 Ohm
Lead Channel Impedance Value: 361 Ohm
Lead Channel Impedance Value: 437 Ohm
Lead Channel Impedance Value: 456 Ohm
Lead Channel Pacing Threshold Amplitude: 0.5 V
Lead Channel Pacing Threshold Amplitude: 0.75 V
Lead Channel Pacing Threshold Pulse Width: 0.4 ms
Lead Channel Pacing Threshold Pulse Width: 0.4 ms
Lead Channel Sensing Intrinsic Amplitude: 2 mV
Lead Channel Sensing Intrinsic Amplitude: 2 mV
Lead Channel Sensing Intrinsic Amplitude: 7.625 mV
Lead Channel Sensing Intrinsic Amplitude: 7.625 mV
Lead Channel Setting Pacing Amplitude: 1.5 V
Lead Channel Setting Pacing Amplitude: 3.5 V
Lead Channel Setting Pacing Pulse Width: 1 ms
Lead Channel Setting Sensing Sensitivity: 0.9 mV

## 2019-07-17 NOTE — Progress Notes (Signed)
PPM Remote  

## 2019-07-19 NOTE — Telephone Encounter (Signed)
Spoke with Beckie Busing at [facility] and patient has been discharged, met all of goals, and "is doing great".

## 2019-07-25 ENCOUNTER — Ambulatory Visit (INDEPENDENT_AMBULATORY_CARE_PROVIDER_SITE_OTHER): Payer: Medicare Other | Admitting: *Deleted

## 2019-07-25 ENCOUNTER — Other Ambulatory Visit: Payer: Self-pay

## 2019-07-25 DIAGNOSIS — Z5181 Encounter for therapeutic drug level monitoring: Secondary | ICD-10-CM

## 2019-07-25 DIAGNOSIS — I4892 Unspecified atrial flutter: Secondary | ICD-10-CM | POA: Diagnosis not present

## 2019-07-25 LAB — POCT INR: INR: 1.6 — AB (ref 2.0–3.0)

## 2019-07-25 NOTE — Patient Instructions (Signed)
Description   Today take 2.5 tablets then continue same dosage 2 tablets everyday except 1.5 tablets on Mondays and Fridays. Recheck INR in 3 weeks (noramlly 6 weeks). Call with any changes or new medications [phone].

## 2019-08-15 ENCOUNTER — Other Ambulatory Visit: Payer: Self-pay

## 2019-08-15 ENCOUNTER — Ambulatory Visit (INDEPENDENT_AMBULATORY_CARE_PROVIDER_SITE_OTHER): Payer: Medicare Other

## 2019-08-15 DIAGNOSIS — Z5181 Encounter for therapeutic drug level monitoring: Secondary | ICD-10-CM

## 2019-08-15 DIAGNOSIS — I4892 Unspecified atrial flutter: Secondary | ICD-10-CM | POA: Diagnosis not present

## 2019-08-15 LAB — POCT INR: INR: 2.6 (ref 2.0–3.0)

## 2019-08-15 NOTE — Patient Instructions (Signed)
Description   Continue on same dosage 2 tablets everyday except 1.5 tablets on Mondays and Fridays. Recheck INR in 4 weeks (noramlly 6 weeks). Call with any changes or new medications [phone].

## 2019-08-26 ENCOUNTER — Other Ambulatory Visit: Payer: Self-pay | Admitting: Family Medicine

## 2019-08-27 ENCOUNTER — Other Ambulatory Visit: Payer: Self-pay | Admitting: Internal Medicine

## 2019-09-09 ENCOUNTER — Observation Stay (HOSPITAL_COMMUNITY): Payer: Medicare Other

## 2019-09-09 ENCOUNTER — Inpatient Hospital Stay (HOSPITAL_COMMUNITY)
Admission: EM | Admit: 2019-09-09 | Discharge: 2019-09-14 | DRG: 065 | Disposition: A | Discharge status: Skilled Nursing Facility | Payer: Medicare Other | Attending: Internal Medicine | Admitting: Internal Medicine

## 2019-09-09 ENCOUNTER — Emergency Department (HOSPITAL_COMMUNITY): Payer: Medicare Other

## 2019-09-09 ENCOUNTER — Emergency Department (HOSPITAL_COMMUNITY)
Admission: EM | Admit: 2019-09-09 | Discharge: 2019-09-09 | Disposition: A | Discharge status: Home / Self Care | Payer: Medicare Other | Attending: Emergency Medicine | Admitting: Emergency Medicine

## 2019-09-09 ENCOUNTER — Encounter (HOSPITAL_COMMUNITY): Payer: Self-pay | Admitting: Family Medicine

## 2019-09-09 ENCOUNTER — Other Ambulatory Visit: Payer: Self-pay

## 2019-09-09 DIAGNOSIS — I6340 Cerebral infarction due to embolism of unspecified cerebral artery: Secondary | ICD-10-CM | POA: Insufficient documentation

## 2019-09-09 DIAGNOSIS — I639 Cerebral infarction, unspecified: Principal | ICD-10-CM

## 2019-09-09 DIAGNOSIS — I495 Sick sinus syndrome: Secondary | ICD-10-CM | POA: Diagnosis present

## 2019-09-09 DIAGNOSIS — I10 Essential (primary) hypertension: Secondary | ICD-10-CM | POA: Diagnosis present

## 2019-09-09 DIAGNOSIS — R531 Weakness: Secondary | ICD-10-CM

## 2019-09-09 DIAGNOSIS — Z45018 Encounter for adjustment and management of other part of cardiac pacemaker: Secondary | ICD-10-CM

## 2019-09-09 DIAGNOSIS — I480 Paroxysmal atrial fibrillation: Secondary | ICD-10-CM | POA: Diagnosis present

## 2019-09-09 DIAGNOSIS — E039 Hypothyroidism, unspecified: Secondary | ICD-10-CM | POA: Diagnosis present

## 2019-09-09 DIAGNOSIS — I5022 Chronic systolic (congestive) heart failure: Secondary | ICD-10-CM | POA: Diagnosis present

## 2019-09-09 DIAGNOSIS — Z8673 Personal history of transient ischemic attack (TIA), and cerebral infarction without residual deficits: Secondary | ICD-10-CM

## 2019-09-09 DIAGNOSIS — Z9049 Acquired absence of other specified parts of digestive tract: Secondary | ICD-10-CM | POA: Insufficient documentation

## 2019-09-09 DIAGNOSIS — I4821 Permanent atrial fibrillation: Secondary | ICD-10-CM | POA: Diagnosis present

## 2019-09-09 DIAGNOSIS — Z20822 Contact with and (suspected) exposure to covid-19: Secondary | ICD-10-CM | POA: Diagnosis present

## 2019-09-09 DIAGNOSIS — R262 Difficulty in walking, not elsewhere classified: Secondary | ICD-10-CM | POA: Diagnosis present

## 2019-09-09 DIAGNOSIS — N189 Chronic kidney disease, unspecified: Secondary | ICD-10-CM | POA: Diagnosis present

## 2019-09-09 DIAGNOSIS — Z87891 Personal history of nicotine dependence: Secondary | ICD-10-CM | POA: Insufficient documentation

## 2019-09-09 DIAGNOSIS — I2510 Atherosclerotic heart disease of native coronary artery without angina pectoris: Secondary | ICD-10-CM | POA: Diagnosis present

## 2019-09-09 DIAGNOSIS — Z87820 Personal history of traumatic brain injury: Secondary | ICD-10-CM

## 2019-09-09 DIAGNOSIS — E785 Hyperlipidemia, unspecified: Secondary | ICD-10-CM | POA: Diagnosis present

## 2019-09-09 DIAGNOSIS — R410 Disorientation, unspecified: Secondary | ICD-10-CM | POA: Diagnosis not present

## 2019-09-09 DIAGNOSIS — Z7901 Long term (current) use of anticoagulants: Secondary | ICD-10-CM

## 2019-09-09 DIAGNOSIS — G8194 Hemiplegia, unspecified affecting left nondominant side: Secondary | ICD-10-CM | POA: Diagnosis present

## 2019-09-09 DIAGNOSIS — G9340 Encephalopathy, unspecified: Secondary | ICD-10-CM | POA: Diagnosis not present

## 2019-09-09 DIAGNOSIS — R29810 Facial weakness: Secondary | ICD-10-CM | POA: Diagnosis present

## 2019-09-09 DIAGNOSIS — R52 Pain, unspecified: Secondary | ICD-10-CM | POA: Diagnosis not present

## 2019-09-09 DIAGNOSIS — R1313 Dysphagia, pharyngeal phase: Secondary | ICD-10-CM | POA: Diagnosis present

## 2019-09-09 DIAGNOSIS — Z825 Family history of asthma and other chronic lower respiratory diseases: Secondary | ICD-10-CM

## 2019-09-09 DIAGNOSIS — I130 Hypertensive heart and chronic kidney disease with heart failure and stage 1 through stage 4 chronic kidney disease, or unspecified chronic kidney disease: Secondary | ICD-10-CM | POA: Diagnosis present

## 2019-09-09 DIAGNOSIS — I63411 Cerebral infarction due to embolism of right middle cerebral artery: Secondary | ICD-10-CM | POA: Diagnosis not present

## 2019-09-09 DIAGNOSIS — Z841 Family history of disorders of kidney and ureter: Secondary | ICD-10-CM

## 2019-09-09 DIAGNOSIS — N183 Chronic kidney disease, stage 3 unspecified: Secondary | ICD-10-CM | POA: Insufficient documentation

## 2019-09-09 DIAGNOSIS — Z79890 Hormone replacement therapy (postmenopausal): Secondary | ICD-10-CM

## 2019-09-09 DIAGNOSIS — R4781 Slurred speech: Secondary | ICD-10-CM | POA: Diagnosis not present

## 2019-09-09 DIAGNOSIS — I428 Other cardiomyopathies: Secondary | ICD-10-CM | POA: Diagnosis present

## 2019-09-09 DIAGNOSIS — I63511 Cerebral infarction due to unspecified occlusion or stenosis of right middle cerebral artery: Principal | ICD-10-CM | POA: Diagnosis present

## 2019-09-09 DIAGNOSIS — Z95810 Presence of automatic (implantable) cardiac defibrillator: Secondary | ICD-10-CM

## 2019-09-09 DIAGNOSIS — R471 Dysarthria and anarthria: Secondary | ICD-10-CM | POA: Diagnosis present

## 2019-09-09 DIAGNOSIS — M25519 Pain in unspecified shoulder: Secondary | ICD-10-CM | POA: Diagnosis not present

## 2019-09-09 DIAGNOSIS — I63233 Cerebral infarction due to unspecified occlusion or stenosis of bilateral carotid arteries: Secondary | ICD-10-CM | POA: Diagnosis not present

## 2019-09-09 DIAGNOSIS — R000 Tachycardia, unspecified: Secondary | ICD-10-CM | POA: Diagnosis not present

## 2019-09-09 DIAGNOSIS — Z833 Family history of diabetes mellitus: Secondary | ICD-10-CM

## 2019-09-09 DIAGNOSIS — E860 Dehydration: Secondary | ICD-10-CM | POA: Diagnosis not present

## 2019-09-09 DIAGNOSIS — Z79899 Other long term (current) drug therapy: Secondary | ICD-10-CM

## 2019-09-09 DIAGNOSIS — H547 Unspecified visual loss: Secondary | ICD-10-CM | POA: Diagnosis present

## 2019-09-09 DIAGNOSIS — M353 Polymyalgia rheumatica: Secondary | ICD-10-CM | POA: Diagnosis present

## 2019-09-09 DIAGNOSIS — K5900 Constipation, unspecified: Secondary | ICD-10-CM | POA: Diagnosis present

## 2019-09-09 HISTORY — DX: Presence of cardiac pacemaker: Z95.0

## 2019-09-09 HISTORY — DX: Essential (primary) hypertension: I10

## 2019-09-09 LAB — CBC WITH DIFFERENTIAL/PLATELET
Abs Immature Granulocytes: 0.01 10*3/uL (ref 0.00–0.07)
Basophils Absolute: 0 10*3/uL (ref 0.0–0.1)
Basophils Relative: 1 %
Eosinophils Absolute: 0.1 10*3/uL (ref 0.0–0.5)
Eosinophils Relative: 2 %
HCT: 42.2 % (ref 39.0–52.0)
Hemoglobin: 13.4 g/dL (ref 13.0–17.0)
Immature Granulocytes: 0 %
Lymphocytes Relative: 16 %
Lymphs Abs: 0.7 10*3/uL (ref 0.7–4.0)
MCH: 30.2 pg (ref 26.0–34.0)
MCHC: 31.8 g/dL (ref 30.0–36.0)
MCV: 95.3 fL (ref 80.0–100.0)
Monocytes Absolute: 0.4 10*3/uL (ref 0.1–1.0)
Monocytes Relative: 10 %
Neutro Abs: 3 10*3/uL (ref 1.7–7.7)
Neutrophils Relative %: 71 %
Platelets: 134 10*3/uL — ABNORMAL LOW (ref 150–400)
RBC: 4.43 MIL/uL (ref 4.22–5.81)
RDW: 13.6 % (ref 11.5–15.5)
WBC: 4.2 10*3/uL (ref 4.0–10.5)
nRBC: 0 % (ref 0.0–0.2)

## 2019-09-09 LAB — MAGNESIUM: Magnesium: 2 mg/dL (ref 1.7–2.4)

## 2019-09-09 LAB — COMPREHENSIVE METABOLIC PANEL
ALT: 10 U/L (ref 0–44)
AST: 21 U/L (ref 15–41)
Albumin: 3.8 g/dL (ref 3.5–5.0)
Alkaline Phosphatase: 72 U/L (ref 38–126)
Anion gap: 9 (ref 5–15)
BUN: 16 mg/dL (ref 8–23)
CO2: 28 mmol/L (ref 22–32)
Calcium: 9.3 mg/dL (ref 8.9–10.3)
Chloride: 104 mmol/L (ref 98–111)
Creatinine, Ser: 0.98 mg/dL (ref 0.61–1.24)
GFR calc Af Amer: >60 mL/min (ref >60)
GFR calc non Af Amer: >60 mL/min (ref >60)
Glucose, Bld: 104 mg/dL — ABNORMAL HIGH (ref 70–99)
Potassium: 4.5 mmol/L (ref 3.5–5.1)
Sodium: 141 mmol/L (ref 135–145)
Total Bilirubin: 1 mg/dL (ref 0.3–1.2)
Total Protein: 6.6 g/dL (ref 6.5–8.1)

## 2019-09-09 LAB — PROTIME-INR
INR: 2 — ABNORMAL HIGH (ref 0.8–1.2)
INR: 2 — ABNORMAL HIGH (ref 0.8–1.2)
Prothrombin Time: 22.3 seconds — ABNORMAL HIGH (ref 11.4–15.2)
Prothrombin Time: 22.3 seconds — ABNORMAL HIGH (ref 11.4–15.2)

## 2019-09-09 LAB — RESPIRATORY PANEL BY RT PCR (FLU A&B, COVID)
Influenza A by PCR: NEGATIVE
Influenza B by PCR: NEGATIVE
SARS Coronavirus 2 by RT PCR: NEGATIVE

## 2019-09-09 LAB — URINALYSIS, ROUTINE W REFLEX MICROSCOPIC
Bilirubin Urine: NEGATIVE
Glucose, UA: NEGATIVE mg/dL
Hgb urine dipstick: NEGATIVE
Ketones, ur: NEGATIVE mg/dL
Leukocytes,Ua: NEGATIVE
Nitrite: NEGATIVE
Protein, ur: NEGATIVE mg/dL
Specific Gravity, Urine: 1.006 (ref 1.005–1.030)
pH: 8 (ref 5.0–8.0)

## 2019-09-09 LAB — TSH: TSH: 3.235 u[IU]/mL (ref 0.350–4.500)

## 2019-09-09 MED ORDER — SODIUM CHLORIDE 0.9% FLUSH
3.0000 mL | Freq: Two times a day (BID) | INTRAVENOUS | Status: DC
  Administered 2019-09-09 – 2019-09-14 (×9): 3 mL via INTRAVENOUS

## 2019-09-09 MED ORDER — SODIUM CHLORIDE 0.9 % IV BOLUS
500.0000 mL | Freq: Once | INTRAVENOUS | Status: AC
  Administered 2019-09-09: 500 mL via INTRAVENOUS

## 2019-09-09 MED ORDER — ACETAMINOPHEN 650 MG RE SUPP: 650.0000 mg | RECTAL | Status: DC | PRN

## 2019-09-09 MED ORDER — SODIUM CHLORIDE 0.9 % IV SOLN: 250.0000 mL | INTRAVENOUS | Status: DC | PRN

## 2019-09-09 MED ORDER — IOHEXOL 350 MG/ML SOLN
80.0000 mL | Freq: Once | INTRAVENOUS | Status: AC | PRN
  Administered 2019-09-09: 80 mL via INTRAVENOUS

## 2019-09-09 MED ORDER — WARFARIN - PHARMACIST DOSING INPATIENT: Freq: Every day | Status: DC

## 2019-09-09 MED ORDER — WARFARIN SODIUM 4 MG PO TABS
4.0000 mg | ORAL_TABLET | ORAL | Status: DC
  Filled 2019-09-09: qty 1

## 2019-09-09 MED ORDER — ACETAMINOPHEN 160 MG/5ML PO SOLN: 650.0000 mg | ORAL | Status: DC | PRN

## 2019-09-09 MED ORDER — STROKE: EARLY STAGES OF RECOVERY BOOK: Freq: Once | Status: DC

## 2019-09-09 MED ORDER — LABETALOL HCL 5 MG/ML IV SOLN: 10.0000 mg | INTRAVENOUS | Status: DC | PRN

## 2019-09-09 MED ORDER — ACETAMINOPHEN 325 MG PO TABS: 650.0000 mg | ORAL_TABLET | ORAL | Status: DC | PRN

## 2019-09-09 MED ORDER — SENNOSIDES-DOCUSATE SODIUM 8.6-50 MG PO TABS: 1.0000 | ORAL_TABLET | Freq: Every evening | ORAL | Status: DC | PRN

## 2019-09-09 MED ORDER — SODIUM CHLORIDE 0.9% FLUSH: 3.0000 mL | INTRAVENOUS | Status: DC | PRN

## 2019-09-09 NOTE — ED Provider Notes (Signed)
[facility] EMERGENCY DEPARTMENT Provider Note   CSN: [id number] Arrival date & time: 09/09/19  2050     History Chief Complaint  Patient presents with  . Fall    JIRAIYA FORSHEE is a 86 y.o. male.  Patient is a 86 year old male with a significant past medical history of atrial fibrillation on Coumadin, pacemaker, CHF and hypothyroidism presenting today for the second time after 2 falls at home.  Patient reports that he recently moved into independent living and for the last 10 days he has just been tired but this morning he was unable to get out of bed.  This is extremely unusual for him as his wife reports he is always very active.  Patient states he has been under a lot of stress but was not sure why he could not get out of bed this morning.  He did have a fall at home but denied hitting his head.  He was seen in the emergency room earlier today and at that time was not found to have any neurologic findings on exam.  He was able to ambulate in the department with his walker and he had no significant findings on lab and head CT.  Patient reports he was returning home and when he got back to his home he could not get out of the car because his left side felt weak.  He then fell to the ground when trying to walk with his walker.  Security was able to help him up and helped him to his room but then a second time he was walking with his walker from the living room to the kitchen and had a second fall on the hardwood floor onto his buttocks because his left leg felt weak.  Patient denies any headache, neck pain or back pain.  He denies any speech difficulty or swallowing problems.  No recent vision changes.  No chest pain or shortness of breath.  The history is provided by the patient, the EMS personnel and medical records.  Fall This is a recurrent problem. The current episode started less than 1 hour ago. The problem has been gradually worsening. Pertinent negatives include no  chest pain, no abdominal pain, no headaches and no shortness of breath. Associated symptoms comments: Left sided weakness . The symptoms are aggravated by walking. Nothing relieves the symptoms. He has tried nothing for the symptoms. The treatment provided no relief.       No past medical history on file.  There are no problems to display for this patient.        No family history on file.  Social History   Tobacco Use  . Smoking status: Not on file  Substance Use Topics  . Alcohol use: Not on file  . Drug use: Not on file    Home Medications Prior to Admission medications   Not on File    Allergies    Patient has no allergy information on record.  Review of Systems   Review of Systems  Respiratory: Negative for shortness of breath.   Cardiovascular: Negative for chest pain.  Gastrointestinal: Negative for abdominal pain.  Neurological: Negative for headaches.    Physical Exam Updated Vital Signs BP (!) 168/109 (BP Location: Left Arm)   Pulse 62   Temp 97.9 F (36.6 C) (Oral)   Resp 15   SpO2 98%   Physical Exam Vitals and nursing note reviewed.  Constitutional:      General: He is not in  acute distress.    Appearance: He is well-developed.  HENT:     Head: Normocephalic and atraumatic.  Eyes:     Conjunctiva/sclera: Conjunctivae normal.     Pupils: Pupils are equal, round, and reactive to light.  Cardiovascular:     Rate and Rhythm: Normal rate. Rhythm irregularly irregular.     Heart sounds: No murmur.     Comments: Pacemaker in the left chest wall Pulmonary:     Effort: Pulmonary effort is normal. No respiratory distress.     Breath sounds: Normal breath sounds. No wheezing or rales.  Abdominal:     General: There is no distension.     Palpations: Abdomen is soft.     Tenderness: There is no abdominal tenderness. There is no guarding or rebound.  Musculoskeletal:        General: No tenderness. Normal range of motion.     Cervical back:  Normal range of motion and neck supple.  Skin:    General: Skin is warm and dry.     Findings: No erythema or rash.  Neurological:     Mental Status: He is alert and oriented to person, place, and time.     Coordination: Finger-Nose-Finger Test and Heel to [place] Test normal.     Comments: Mild weakness noted to the left upper and lower extremity.  Pronator drift present in upper and lower extremity on the left.  No pronator drift noted on the right.  No notable facial droop.  Patient has legal blindness in his left eye so extraocular movements are abnormal with the left eye but normal with the right.  No notable visual field cuts with the right.  No aphasia noted.  Psychiatric:        Mood and Affect: Mood normal.        Behavior: Behavior normal.        Thought Content: Thought content normal.     ED Results / Procedures / Treatments   Labs (all labs ordered are listed, but only abnormal results are displayed) Labs Reviewed - No data to display  EKG EKG Interpretation  Date/Time:  Sunday Sep 09 2019 20:57:36 EDT Ventricular Rate:  63 PR Interval:    QRS Duration: 99 QT Interval:  426 QTC Calculation: 437 R Axis:   -11 Text Interpretation: Atrial fibrillation No significant change since last tracing Confirmed by Blanchie Dessert [phone]) on 09/09/2019 9:24:16 PM   Radiology No results found.  Procedures Procedures (including critical care time)  Medications Ordered in ED Medications - No data to display  ED Course  I have reviewed the triage vital signs and the nursing notes.  Pertinent labs & imaging results that were available during my care of the patient were reviewed by me and considered in my medical decision making (see chart for details).    MDM Rules/Calculators/A&P                      Patient recently evaluated in the emergency room for generalized weakness and inability to get out of bed.  Patient was given IV fluids but had relatively benign exam including  labs and head CT.  Patient was given fluids and prior to discharge was ambulating with his walker.  However when he got back to his home he had 2 falls in left-sided weakness in the left arm and leg.  He denies any other symptoms at this time.  On exam he does have mild pronator drift on that  side but no other significant findings.  Low NIH at this time.  Will discuss with neurology.  Patient needs an MRI of his brain however because he has a pacemaker it is unable to be completed tonight.  INR pending as patient is on Coumadin.  Will discuss with neurology for further recommendations.  9:29 PM Dr. Malen Gauze recommended CTA of head and neck to further eval due to being unable to get MRI tonight due to pacemaker.  Also recommended admission for full stroke workup  MDM Number of Diagnoses or Management Options   Amount and/or Complexity of Data Reviewed Clinical lab tests: ordered Tests in the medicine section of CPT: ordered and reviewed Decide to obtain previous medical records or to obtain history from someone other than the patient: yes Obtain history from someone other than the patient: yes Review and summarize past medical records: yes Discuss the patient with other providers: yes Independent visualization of images, tracings, or specimens: yes  Risk of Complications, Morbidity, and/or Mortality Presenting problems: moderate Diagnostic procedures: low Management options: low  Patient Progress Patient progress: stable  Final Clinical Impression(s) / ED Diagnoses Final diagnoses:  Cerebrovascular accident (CVA), unspecified mechanism ([place])    Rx / DC Orders ED Discharge Orders    None       Blanchie Dessert, MD 09/09/19 2200

## 2019-09-09 NOTE — ED Notes (Signed)
Pt. Transported to CT 

## 2019-09-09 NOTE — Discharge Instructions (Signed)
You were seen in the emergency room today with generalized weakness.  Please drink plenty of fluids and follow with your primary care doctor closely.  We have sent you home with a walker and are arranging home health for physical therapy.  Please return with any new or suddenly worsening symptoms.

## 2019-09-09 NOTE — ED Notes (Signed)
Patient verbalizes understanding of discharge instructions. Opportunity for questioning and answers were provided. Armband removed by staff, pt discharged from ED in wheelchair to home.   

## 2019-09-09 NOTE — H&P (Signed)
History and Physical    [name] [id number] DOB: December 15, 1925 DOA: 09/09/2019  PCP: [name], MD   Patient coming from: IL[facility]   Chief Complaint: Left-sided weakness, slurred speech   HPI: [name] is a 86 y.o. male with medical history significant for hypertension, hypothyroidism, chronic systolic CHF, atrial fibrillation on warfarin, traumatic ICH in January 2021, and sick sinus syndrome with pacemaker, now presenting to the emergency department with left-sided weakness, slurred speech, and ambulatory difficulty with falls.  Patient reports several days of fatigue, his wife thought that his speech was slurred couple days ago, and he was having some difficulty ambulating despite using his walker and suffered a mechanical fall without hitting his head or any significant injury.  He was seen in the ED for this earlier today, had negative head CT and normal blood work, was given a small fluid bolus, and was discharged home but was having even more trouble than before ambulating with his walker, felt as though he was leaning towards the left when standing and trying to walk, and had difficulty eating due to weakness in his left arm.  He also felt that his left lower extremity was weak.  He returned to the ED.  He had another fall, reports landing softly on his bottom, and denies any pain from that but was unable to get up on his own.  ED Course: Upon arrival to the ED, patient is found to be afebrile, saturating well on room air, and hypertensive to 190/85.  EKG features a paced rhythm, noncontrast head CT is negative for acute intracranial abnormality, and chest x-ray with enlarged cardiac silhouette.  Chemistry panel is unremarkable and CBC with slight thrombocytopenia.  Urinalysis was unremarkable and INR 2.0.  Patient was given 500 cc of saline and discharged from the hospital initially, but developed left-sided weakness, had another fall, and came back into the ED.  Neurology was  consulted by the ED physician and medical admission was recommended.  CTA head and neck have been ordered but not yet performed.  Review of Systems:  All other systems reviewed and apart from HPI, are negative.  Past Medical History:  Diagnosis Date  . Hypertension   . Hypothyroidism   . Presence of permanent cardiac pacemaker     History reviewed. No pertinent surgical history.   reports that he has quit smoking. He has never used smokeless tobacco. He reports current alcohol use. He reports that he does not use drugs.  Not on File  Family History  Problem Relation Age of Onset  . Emphysema Mother   . Diabetes Mother   . Kidney disease Father      Prior to Admission medications   Not on File    Physical Exam: Vitals:   09/09/19 2059 09/09/19 2110 09/09/19 2120 09/09/19 2123  BP: (!) 168/109  (!) 190/85   Pulse: 62  65   Resp: 15  (!) 21   Temp: 97.9 F (36.6 C)     TempSrc: Oral     SpO2: 98% 98% 97%   Weight:    68 kg  Height:    5\' 9"  (1.753 m)    Constitutional: NAD, calm  Eyes: PERTLA, lids and conjunctivae normal ENMT: Mucous membranes are moist. Posterior pharynx clear of any exudate or lesions.   Neck: normal, supple, no masses, no thyromegaly Respiratory: clear to auscultation bilaterally, no wheezing, no crackles. No accessory muscle use.  Cardiovascular: S1 & S2 heard, regular rate and  rhythm. No extremity edema.  Abdomen: No distension, no tenderness, soft. Bowel sounds active.  Musculoskeletal: no clubbing / cyanosis. No joint deformity upper and lower extremities.   Skin: no significant rashes, lesions, ulcers. Warm, dry, well-perfused. Neurologic: Subtle left lower facial weakness, CN 2-12 grossly intact otherwise. Sensation to light touch intact. Strength 4/5 in LUE and otherwise 5/5.  Psychiatric: Alert and oriented to person, place, and situation. Pleasant and cooperative.    Labs and Imaging on Admission: I have personally reviewed following  labs and imaging studies  CBC: No results for input(s): WBC, NEUTROABS, HGB, HCT, MCV, PLT in the last 168 hours. Basic Metabolic Panel: No results for input(s): NA, K, CL, CO2, GLUCOSE, BUN, CREATININE, CALCIUM, MG, PHOS in the last 168 hours. GFR: CrCl cannot be calculated (No successful lab value found.). Liver Function Tests: No results for input(s): AST, ALT, ALKPHOS, BILITOT, PROT, ALBUMIN in the last 168 hours. No results for input(s): LIPASE, AMYLASE in the last 168 hours. No results for input(s): AMMONIA in the last 168 hours. Coagulation Profile: No results for input(s): INR, PROTIME in the last 168 hours. Cardiac Enzymes: No results for input(s): CKTOTAL, CKMB, CKMBINDEX, TROPONINI in the last 168 hours. BNP (last 3 results) No results for input(s): PROBNP in the last 8760 hours. HbA1C: No results for input(s): HGBA1C in the last 72 hours. CBG: No results for input(s): GLUCAP in the last 168 hours. Lipid Profile: No results for input(s): CHOL, HDL, LDLCALC, TRIG, CHOLHDL, LDLDIRECT in the last 72 hours. Thyroid Function Tests: No results for input(s): TSH, T4TOTAL, FREET4, T3FREE, THYROIDAB in the last 72 hours. Anemia Panel: No results for input(s): VITAMINB12, FOLATE, FERRITIN, TIBC, IRON, RETICCTPCT in the last 72 hours. Urine analysis: No results found for: COLORURINE, APPEARANCEUR, LABSPEC, PHURINE, GLUCOSEU, HGBUR, BILIRUBINUR, KETONESUR, PROTEINUR, UROBILINOGEN, NITRITE, LEUKOCYTESUR Sepsis Labs: @LABRCNTIP (procalcitonin:4,lacticidven:4) )No results found for this or any previous visit (from the past 240 hour(s)).   Radiological Exams on Admission: No results found.  EKG: Independently reviewed. Atrial-paced rhythm.   Assessment/Plan   1. Left-sided weakness and dysarthria  - Presents with left-sided weakness after several days of fatigue and possible dysarthria, had negative head CT earlier today and normal basic blood work  - Neurology consulting and  much appreciated - Check CTA head and neck, MRI brain when able (has pacemaker), echocardiogram, EEG, A1c, lipid panel, and B12 level  - Continue neuro checks, cardiac monitoring, PT/OT/SLP evals     2. Paroxysmal atrial fibrillation  - CHADS-VASc at least 4 (age x2, HTN, CHF) - INR was 2.0 earlier today  - Continue warfarin with pharmacy assistance unless bleeding or large infarct noted on imaging    3. Chronic systolic CHF  - Appears compensated  - Echo from 2017 with EF 40-45%  - SLIV, monitor I/Os, follow-up pharmacy medication-reconciliation    4. Hypertension  - BP elevated in ED, no need for permissive HTN per neuro  - Use labetalol IVPs for now pending pharmacy medication-reconciliation   5. Hypothyroidism  - TSH was normal in ED earlier today  - Continue Synthroid pending pharmacy medication-reconciliation     DVT prophylaxis: Warfarin  Code Status: Full  Family Communication: Discussed with patient   Disposition Plan:  Patient is from: ILF Anticipated d/c is to: TBD Anticipated d/c date is: 09/10/19 Patient currently: Pending brain imaging, echo, EEG, therapy assessments   Consults called: Neurology  Admission status: Observation     Vianne Bulls, MD [facility] Pager: See [email or web address]  If  7AM-7PM, please contact the daytime attending [email or web address]  09/09/2019, 10:17 PM

## 2019-09-09 NOTE — ED Notes (Signed)
Wife's number is [phone]

## 2019-09-09 NOTE — ED Notes (Signed)
Patient ambulated around nursing station with walker with steady gait.

## 2019-09-09 NOTE — ED Provider Notes (Signed)
Emergency Department Provider Note   I have reviewed the triage vital signs and the nursing notes.   HISTORY  Chief Complaint Weakness   HPI [name] is a 86 y.o. male with PMH of A-fib on Coumadin, HLD, Hypothyroidism, and prior ICH after fall in January 2021 presents to the emergency department for evaluation of generalized weakness worsening over the past 2 to 3 days.  Patient describes urine frequency and feeling severe fatigue.  He recently moved with his wife to independent living at [facility] home 10 days ago.  He states that the move was stressful for him but he managed okay.  He has noticed the urine frequency and generalized weakness over the past 2 days.  He is typically very active and performs exercises almost daily including push-ups and has been unable to do that recently.  He denies any new medications.  He has not seen black or bright red blood in the bowel movements.  He is not experiencing pain in his abdomen, chest, head.  Prior to moving to [facility] homes the patient did have a fall at home but did not strike his head during that encounter and did not seek medical attention.  He denies cough, fever, shortness of breath.   Past Medical History:  Diagnosis Date  . AF (atrial fibrillation) [facility])    a. s/p RFCA/PVI @ [facility] clinic 2007.  . Arthritis    "joints" (06/21/2013)  . Atrial flutter ([place])    a. 06/2013 s/p TEE/DCCV  . Chronic systolic CHF (congestive heart failure) ([place])    a. 06/2013 Echo: EF 20%, diff HK, mild LVH.  Marland Kitchen History of cardiovascular stress test    Lexiscan Myoview 8/16:  EF 59%, attenuation artifact, no ischemia; Low Risk  . Hyperlipidemia   . HYPERTENSION, BENIGN 08/21/2009   Qualifier: Diagnosis of  By: Caryl Comes, MD, Remus Blake  Medications(s)  enalapril   . Hypothyroidism   . Neoplasm of uncertain behavior    SKIN  . Polymyalgia rheumatica ([place]) 01/13/2007   Qualifier: History of  By: Danny Lawless CMA, [place]       Patient Active  Problem List   Diagnosis Date Noted  . Right radial fracture 05/07/2019  . Intracranial bleed ([place]) 05/06/2019  . Right wrist fracture 05/06/2019  . BPH (benign prostatic hyperplasia) 10/20/2016  . Sinus node dysfunction ([place]) 11/10/2015  . Atrial fibrillation ([place]) 10/03/2015  . Atrial fibrillation with RVR ([place]) 10/03/2015  . Polycythemia 07/25/2015  . NICM (nonischemic cardiomyopathy) ([place]) 07/04/2015  . Chronic anticoagulation 07/04/2015  . CKD (chronic kidney disease) stage 3, GFR 30-59 ml/min 01/24/2015  . Atypical atrial flutter ([place])   . Encounter for therapeutic drug monitoring 06/26/2013  . Atrial flutter ([place]) 06/21/2013    Class: Acute  . Acute systolic heart failure ([place]) 06/21/2013  . Hearing loss 06/04/2013  . Routine adult health maintenance 07/30/2012  . Palpitation 07/18/2012  . Stress at home 07/18/2012  . Urinary urgency 03/12/2012  . Hypogonadism male 01/16/2010  . HYPERTENSION, BENIGN 08/21/2009    Class: Chronic  . PAF (paroxysmal atrial fibrillation) ([place]) 01/03/2008  . NEOPLASM OF UNCERTAIN BEHAVIOR OF SKIN 04/05/2007  . Hypothyroidism 01/13/2007  . Hyperlipidemia 01/13/2007  . Polymyalgia rheumatica ([place]) 01/13/2007  . INGUINAL HERNIORRHAPHY, HX OF 01/13/2007    Past Surgical History:  Procedure Laterality Date  . ATRIAL FIBRILLATION ABLATION  2007  . CARDIOVERSION N/A 06/22/2013   Procedure: CARDIOVERSION;  Surgeon: Sueanne Margarita, MD;  Location: [employer];  Service: Cardiovascular;  Laterality: N/A;  15:17 Lido 20mg ,IV , Propofol 30 mg, IV synched cardioversion @ 150 joules by Dr. Lajoyce Lauber from a flutter to sinis brady which is baseline for this pt, reports 45-50 reg HR verified by Dr. Ashok Norris  . CARDIOVERSION N/A 11/21/2014   Procedure: CARDIOVERSION;  Surgeon: Fay Records, MD;  Location: [facility] ENDOSCOPY;  Service: Cardiovascular;  Laterality: N/A;  . CARDIOVERSION N/A 07/14/2015   Procedure: CARDIOVERSION;  Surgeon: Sueanne Margarita, MD;  Location:  [facility];  Service: Cardiovascular;  Laterality: N/A;  . CATARACT EXTRACTION, BILATERAL Bilateral Summer 2009  . CHOLECYSTECTOMY    . EP IMPLANTABLE DEVICE N/A 11/10/2015   Procedure: Pacemaker Implant;  Surgeon: [name], MD;  Location: [place] CV LAB;  Service: Cardiovascular;  Laterality: N/A;  . HAND SURGERY Right    "broke bone; grafted area w/bone from somewhere else"  . HEMORRHOID SURGERY    . INGUINAL HERNIA REPAIR Bilateral   . TEE WITHOUT CARDIOVERSION N/A 06/22/2013   Procedure: TRANSESOPHAGEAL ECHOCARDIOGRAM (TEE);  Surgeon: Sueanne Margarita, MD;  Location: [facility] ENDOSCOPY;  Service: Cardiovascular;  Laterality: N/A;  . TEE WITHOUT CARDIOVERSION N/A 07/14/2015   Procedure: TRANSESOPHAGEAL ECHOCARDIOGRAM (TEE);  Surgeon: Sueanne Margarita, MD;  Location: [facility] ENDOSCOPY;  Service: Cardiovascular;  Laterality: N/A;  . TONSILLECTOMY      Allergies Patient has no known allergies.  Family History  Problem Relation Age of Onset  . Emphysema Mother   . Diabetes Mother   . Kidney disease Father     Social History Social History   Tobacco Use  . Smoking status: Former Smoker    Packs/day: 1.00    Years: 12.00    Pack years: 12.00    Types: Cigarettes    Quit date: 05/04/1963    Years since quitting: 56.3  . Smokeless tobacco: Never Used  . Tobacco comment: quit cigars 82'   Substance Use Topics  . Alcohol use: Yes    Comment: 06/21/2013 "I was a moderate drinker at most; rarely have drink now"  . Drug use: No    Review of Systems  Constitutional: No fever/chills. Positive weakness.  Eyes: No visual changes. ENT: No sore throat. Cardiovascular: Denies chest pain. Respiratory: Denies shortness of breath. Gastrointestinal: No abdominal pain.  No nausea, no vomiting.  No diarrhea.  No constipation. Genitourinary: Negative for dysuria. Positive urine frequency.  Musculoskeletal: Negative for back pain. Skin: Negative for rash. Neurological: Negative for headaches,  focal weakness or numbness.  10-point ROS otherwise negative.  ____________________________________________   PHYSICAL EXAM:  VITAL SIGNS: ED Triage Vitals  Enc Vitals Group     BP 09/09/19 1032 (!) 196/81     Pulse Rate 09/09/19 1032 61     Resp 09/09/19 1032 17     Temp 09/09/19 1032 (!) 97.5 F (36.4 C)     Temp Source 09/09/19 1032 Oral     SpO2 09/09/19 1028 100 %     Weight 09/09/19 1033 150 lb (68 kg)     Height 09/09/19 1033 5\' 9"  (1.753 m)   Constitutional: Alert and oriented. Well appearing and in no acute distress. Eyes: Conjunctivae are normal. PERRL.  Head: Atraumatic. Nose: No congestion/rhinnorhea. Mouth/Throat: Mucous membranes are moist.  Oropharynx non-erythematous. Neck: No stridor.   Cardiovascular: Normal rate, regular rhythm. Good peripheral circulation. Grossly normal heart sounds.   Respiratory: Normal respiratory effort.  No retractions. Lungs CTAB. Gastrointestinal: Soft and nontender. No distention.  Musculoskeletal: No lower extremity tenderness nor edema. No  gross deformities of extremities. Neurologic:  Normal speech and language.  Patient with no facial asymmetry or sensory deficit to the face.  He has 5 out of 5 strength in the bilateral upper and lower extremities without pronator drift.  Normal sensation bilaterally.  Skin:  Skin is warm, dry and intact. No rash noted.   ____________________________________________   LABS (all labs ordered are listed, but only abnormal results are displayed)  Labs Reviewed  COMPREHENSIVE METABOLIC PANEL - Abnormal; Notable for the following components:      Result Value   Glucose, Bld 104 (*)    All other components within normal limits  CBC WITH DIFFERENTIAL/PLATELET - Abnormal; Notable for the following components:   Platelets 134 (*)    All other components within normal limits  URINALYSIS, ROUTINE W REFLEX MICROSCOPIC - Abnormal; Notable for the following components:   Color, Urine STRAW (*)     All other components within normal limits  PROTIME-INR - Abnormal; Notable for the following components:   Prothrombin Time 22.3 (*)    INR 2.0 (*)    All other components within normal limits  URINE CULTURE  MAGNESIUM  TSH   ____________________________________________  EKG   EKG Interpretation  Date/Time:  Sunday Sep 09 2019 10:37:36 EDT Ventricular Rate:  60 PR Interval:    QRS Duration: 97 QT Interval:  445 QTC Calculation: 445 R Axis:   -10 Text Interpretation: Atrial-paced rhythm Confirmed by Nanda Quinton [phone]) on 09/09/2019 11:19:20 AM       ____________________________________________  RADIOLOGY   ____________________________________________   PROCEDURES  Procedure(s) performed:   Procedures  CT head and CXR reviewed.  ____________________________________________   INITIAL IMPRESSION / ASSESSMENT AND PLAN / ED COURSE  Pertinent labs & imaging results that were available during my care of the patient were reviewed by me and considered in my medical decision making (see chart for details).   Patient presents to the emergency department with generalized weakness over 2 to 3 days with urine frequency.  Vital signs here are remarkable only for elevated blood pressure.  Patient has no focal neurologic deficits and appears alert and oriented x4 here.  Per his wife by EMS he has been having some increased confusion compared to his baseline.  Given this, plan for CT imaging of the head along with chest x-ray, labs including TSH and UA along with culture. Will give gentle IVF bolus and reassess.   CT head and CXR reviewed. Labs reviewed. No UTI. Normal TSH.  CT imaging and chest x-ray unremarkable.  Patient ambulatory here using a walker.  He is briskly walking around the unit without difficulty.  He has no focal neurologic deficits.  Have arranged for home health and discussed case with the patient's wife at bedside.  They are comfortable with discharge home and  discussed ED return precautions in detail. ____________________________________________  FINAL CLINICAL IMPRESSION(S) / ED DIAGNOSES  Final diagnoses:  Generalized weakness    MEDICATIONS GIVEN DURING THIS VISIT:  Medications  sodium chloride 0.9 % bolus 500 mL (0 mLs Intravenous Stopped 09/09/19 1557)    Note:  This document was prepared using Dragon voice recognition software and may include unintentional dictation errors.  Nanda Quinton, MD, [facility]    Romelle Reiley, [facility], MD 09/12/19 [phone]

## 2019-09-09 NOTE — ED Triage Notes (Addendum)
Per [facility], Pt lives in [facility] with his wife. Pt has been having generalized weakness, poor appetite, increased urinary frequency, and bladder incontinence. Wife reports slurred speech since Friday and confusion. Pt alert and oriented x4. Pt reports he couldn't get out of bed today due to feeling weak

## 2019-09-09 NOTE — TOC Transition Note (Signed)
Transition of Care [facility]) - CM/SW Discharge Note   Patient Details  Name: [name] MRN: [id number] Date of Birth: Feb 23, 1926  [facility]) CM/SW Contact:  Verdell Carmine, RN Phone Number: 09/09/2019, 4:10 PM   Clinical Narrative:    Patient in with weakness. Just moved into [place] living. Received IV fluids here. [facility]. Independent living nurse not in, but they stated they handle all arrangements for Home health  Faxed over notes and orders to assisted living. They state they will give them to independent living nurse on Monday. Walker obtained for patient via [place].    Final next level of care: [place] Barriers to Discharge: No Barriers Identified   Patient Goals and CMS Choice Patient states their goals for this hospitalization and ongoing recovery are:: Go home and improve be able to do my push ups   Choice offered to / list presented to : Patient  Discharge Placement                       Discharge Plan and Services                DME Arranged: [facility] DME Agency: [facility]       HH Arranged: PT, OT       Representative spoke with at [place]: WIll be arranged through [facility] nurse items neede faxed to [phone] assisted living side they will get them to independent living nurse  Social Determinants of Health (SDOH) Interventions     Readmission Risk Interventions No flowsheet data found.

## 2019-09-09 NOTE — ED Triage Notes (Signed)
Pt. Transported via [facility] from home c/o of falling after getting out of car around 4:45. Security and staff helped pt. Up. Pt fell again around 6:30. Per EMS no injuries. Pt . Stated L side deficit when trying to eat cantaloupe.

## 2019-09-09 NOTE — Progress Notes (Signed)
ANTICOAGULATION CONSULT NOTE - Initial Consult  Pharmacy Consult for warfarin Indication: atrial fibrillation  Not on File  Patient Measurements: Height: 5\' 9"  (175.3 cm) Weight: 68 kg (150 lb) IBW/kg (Calculated) : 70.7   Vital Signs: Temp: 97.9 F (36.6 C) (05/09 2059) Temp Source: Oral (05/09 2059) BP: 190/85 (05/09 2120) Pulse Rate: 65 (05/09 2120)  Labs: No results for input(s): HGB, HCT, PLT, APTT, LABPROT, INR, HEPARINUNFRC, HEPRLOWMOCWT, CREATININE, CKTOTAL, CKMB, TROPONINIHS in the last 72 hours.  CrCl cannot be calculated (No successful lab value found.).   Medical History: Past Medical History:  Diagnosis Date  . Hypertension   . Hypothyroidism   . Presence of permanent cardiac pacemaker      Assessment: 70 yoM with hx AFib on warfarin admitted with slurred speech and found to have CVA. Neurology okayed restarting warfarin. Last dose yesterday, INR 2.0.  *PTA dose = 3mg  Mon/Fri, 4mg  all other days  Goal of Therapy:  INR 2-3 Monitor platelets by anticoagulation protocol: Yes   Plan:  Warfarin 4mg  x1 tonight Daily protime   Arrie Senate, PharmD, BCPS Clinical Pharmacist [phone] Please check AMION for all [place] numbers 09/09/2019

## 2019-09-09 NOTE — Consult Note (Signed)
Neurology Consultation  Reason for Consult: Slurred speech, difficulty walking, left-sided weakness Referring Physician: Dr. Blanchie Dessert, ED provider  CC: Slurred speech, difficulty walking, left-sided weakness  History is obtained from: Chart review of the current chart with the medical record [id number], patient  Has another chart-medical record [id number] from which medical history was also gleaned.  From the visit this morning-notes are documented in this particular chart number.  HPI: [name] is a 86 y.o. male past medical history of Hypertension, chronic systolic heart failure, polymyalgia rheumatica, paroxysmal atrial fibrillation, CKD, nonischemic cardiomyopathy, hyperlipidemia, hypothyroidism, traumatic intraparenchymal right frontal bleed in January 2021, who is in the emergency room for evaluation of weakness, slurred speech and difficulty walking. He was seen in the emergency room earlier this morning after coming in for being generally weak, and having some slurred speech.  He was evaluated with a noncontrast head CT and blood work.  No focal or lateralizing findings were seen on the exam and he was discharged home.  As he was getting home, he had a fall getting out of the car and was brought back for reevaluation because of not being back at baseline. Reports off-and-on symptoms now for 8 to 10 days but some worsening since Friday and some left-sided weakness worsened since 4 PM today. Currently on Coumadin-last INR 2.0 this morning. No chest pain shortness of breath.  No sick contacts.  No fever chills. No visual symptoms.  No headaches.  No neck pain. Reports baseline poor vision in the left eye with only some peripheral vision.  Repeat exam by the ED provider tonight concerning for some left-sided weakness, for which stroke neurology consultation was called. Was outside the window for TPA or intervention based on symptoms off and on without complete return to baseline  now at least for greater than 24 hours.  January 2021-was admitted after a fall with small traumatic intraparenchymal bleed.  LKW: Difficult to ascertain but greater than 24 hours. tpa given?: no, outside the window Premorbid modified Rankin scale (mRS): 1-2   ROS: Performed and negative except as noted in HPI.   Past medical history: Hypertension Polymyalgia rheumatica CAD Nonischemic cardiomyopathy Paroxysmal atrial fibrillation Nontraumatic dilation with Coumadin  Family history: No family history of stroke  Social History: Quit smoking 56 years ago.  Medications No current facility-administered medications for this encounter. No current outpatient medications on file.   Exam: Current vital signs: BP (!) 190/85   Pulse 65   Temp 97.9 F (36.6 C) (Oral)   Resp (!) 21   Ht 5\' 9"  (1.753 m)   Wt 68 kg   SpO2 97%   BMI 22.15 kg/m  Vital signs in last 24 hours: Temp:  [97.9 F (36.6 C)] 97.9 F (36.6 C) (05/09 2059) Pulse Rate:  [62-65] 65 (05/09 2120) Resp:  [15-21] 21 (05/09 2120) BP: (168-190)/(85-109) 190/85 (05/09 2120) SpO2:  [97 %-98 %] 97 % (05/09 2120) Weight:  [68 kg] 68 kg (05/09 2123) General: Awake alert in no distress HEENT: Normocephalic atraumatic Lungs: Clear Cardiovascular: Irregularly irregular Extremities warm well perfused with trace edema Neurological exam Awake alert oriented x3 Speech is mildly dysarthric No evidence of aphasia Cranial nerves: Pupils equal round reactive to light, extraocular movements appear intact, unclear of baseline but there is some extinction on double simultaneous stimulation on the left visual field, no frank hemianopsia noted, mild left nasolabial fold flattening, articulatory decreased bilaterally, tongue and palate midline. Motor exam: There is subtle vertical and pronator drift in  the left upper extremity which is 4/5.  Left lower extremity also is 4+/5.  Right upper and lower extremity 5/5. Sensory exam:  Intact light touch with no extinction Coordination: There is some dysmetria on the left upper and lower extremity.  No dysmetria of the right. Gait testing deferred at this point NIH stroke scale 1a Level of Conscious.: 0 1b LOC Questions: 0 1c LOC Commands: 0 2 Best Gaze: 0 3 Visual: 1 4 Facial Palsy: 1 5a Motor Arm - left: 1 5b Motor Arm - Right:0 6a Motor Leg - Left: 1 6b Motor Leg - Right: 0 7 Limb Ataxia: 1 8 Sensory: 0 9 Best Language: 0 10 Dysarthria: 1 11 Extinct. and Inatten.: 1 TOTAL: 7  Labs I have reviewed labs in epic and the results pertinent to this consultation are: BMP: Sodium 141, potassium 4.5, glucose 104, BUN 16, creatinine 0.98.  Magnesium 2.0.    CBC with white count of 4.2, RBC 4.43, hemoglobin 13.4, hematocrit 42.2, platelet count 134.    PT 22.3, INR 2.0  TSH 3.2    Imaging I have reviewed the images obtained:  CT-scan of the brain obtained this morning No evidence of acute process.  Stable generalized atrophy and chronic small vessel disease.  Redemonstrated chronic lacunar infarcts within the left caudate and right cerebellum.  Minimal ethmoid and maxillary sinus mucosal thickening.-Report in the other chart number listed above.  MRI examination of the brain-recommended and pending arrival of pacemaker team-likely will happen in the morning.  Assessment:  86 year old past history of hypertension, fall in January with resultant traumatic right-sided ICH that has resolved, polymyalgia rheumatica, paroxysmal atrial fibrillation on Coumadin with therapeutic INR, CKD, nonischemic cardiomyopathy, systolic heart failure, hyper lipidemia, hypothyroidism, presenting for generalized weakness, difficulty walking, slurred speech and left-sided weakness noted on exam along with some dysarthria and left facial droop concerning for acute stroke. He has been having symptoms now for a few days-definitely greater than 24 hours making him outside the window for TPA  or intervention. He would benefit from further stroke work-up including MRI imaging. Given the prior history of a small traumatic ICH in the right frontal area, an EEG also would be prudent to rule out any electrographic abnormality.  Impression: Evaluate for stroke Evaluate for seizure   Recommendations: Admit to hospitalist Frequent neurochecks Telemetry MRI brain without contrast when able to CTA head and neck Continue Coumadin unless he has big strokes on MRI or the CTA CT head shows any new evidence of bleed-since symptoms have not been ongoing for a few days. 2D echo A1c Lipid panel PT OT next speech therapy No need for permissive hypertension at this time Obtain an EEG in addition to the stroke work-up Check UA and chest x-ray TSH is already been checked and is normal.  Check B12 levels as well.  Plan was discussed in the emergency room with Dr. Blanchie Dessert.  Stroke team will follow with you.   -- Amie Portland, MD [facility] Pager: [phone] If 7pm to 7am, please call on call as listed on AMION.

## 2019-09-10 ENCOUNTER — Observation Stay (HOSPITAL_COMMUNITY): Payer: Medicare Other

## 2019-09-10 ENCOUNTER — Inpatient Hospital Stay (HOSPITAL_COMMUNITY): Payer: Medicare Other

## 2019-09-10 ENCOUNTER — Other Ambulatory Visit (HOSPITAL_COMMUNITY): Payer: Medicare Other

## 2019-09-10 DIAGNOSIS — I639 Cerebral infarction, unspecified: Secondary | ICD-10-CM | POA: Diagnosis present

## 2019-09-10 DIAGNOSIS — I6340 Cerebral infarction due to embolism of unspecified cerebral artery: Secondary | ICD-10-CM | POA: Insufficient documentation

## 2019-09-10 DIAGNOSIS — K5901 Slow transit constipation: Secondary | ICD-10-CM | POA: Diagnosis not present

## 2019-09-10 DIAGNOSIS — N401 Enlarged prostate with lower urinary tract symptoms: Secondary | ICD-10-CM | POA: Diagnosis not present

## 2019-09-10 DIAGNOSIS — R279 Unspecified lack of coordination: Secondary | ICD-10-CM | POA: Diagnosis not present

## 2019-09-10 DIAGNOSIS — I480 Paroxysmal atrial fibrillation: Secondary | ICD-10-CM | POA: Diagnosis not present

## 2019-09-10 DIAGNOSIS — I6389 Other cerebral infarction: Secondary | ICD-10-CM | POA: Diagnosis not present

## 2019-09-10 DIAGNOSIS — Z950 Presence of cardiac pacemaker: Secondary | ICD-10-CM | POA: Diagnosis not present

## 2019-09-10 DIAGNOSIS — I63311 Cerebral infarction due to thrombosis of right middle cerebral artery: Secondary | ICD-10-CM | POA: Diagnosis not present

## 2019-09-10 DIAGNOSIS — R1312 Dysphagia, oropharyngeal phase: Secondary | ICD-10-CM | POA: Diagnosis not present

## 2019-09-10 DIAGNOSIS — I2510 Atherosclerotic heart disease of native coronary artery without angina pectoris: Secondary | ICD-10-CM | POA: Diagnosis present

## 2019-09-10 DIAGNOSIS — I63511 Cerebral infarction due to unspecified occlusion or stenosis of right middle cerebral artery: Secondary | ICD-10-CM | POA: Diagnosis present

## 2019-09-10 DIAGNOSIS — Z7401 Bed confinement status: Secondary | ICD-10-CM | POA: Diagnosis not present

## 2019-09-10 DIAGNOSIS — Z833 Family history of diabetes mellitus: Secondary | ICD-10-CM | POA: Diagnosis not present

## 2019-09-10 DIAGNOSIS — E785 Hyperlipidemia, unspecified: Secondary | ICD-10-CM | POA: Diagnosis present

## 2019-09-10 DIAGNOSIS — I4891 Unspecified atrial fibrillation: Secondary | ICD-10-CM | POA: Diagnosis not present

## 2019-09-10 DIAGNOSIS — M6281 Muscle weakness (generalized): Secondary | ICD-10-CM | POA: Diagnosis not present

## 2019-09-10 DIAGNOSIS — J698 Pneumonitis due to inhalation of other solids and liquids: Secondary | ICD-10-CM | POA: Diagnosis not present

## 2019-09-10 DIAGNOSIS — Z20822 Contact with and (suspected) exposure to covid-19: Secondary | ICD-10-CM | POA: Diagnosis present

## 2019-09-10 DIAGNOSIS — R4781 Slurred speech: Secondary | ICD-10-CM | POA: Diagnosis not present

## 2019-09-10 DIAGNOSIS — I4821 Permanent atrial fibrillation: Secondary | ICD-10-CM | POA: Diagnosis present

## 2019-09-10 DIAGNOSIS — I10 Essential (primary) hypertension: Secondary | ICD-10-CM | POA: Diagnosis not present

## 2019-09-10 DIAGNOSIS — I744 Embolism and thrombosis of arteries of extremities, unspecified: Secondary | ICD-10-CM | POA: Diagnosis not present

## 2019-09-10 DIAGNOSIS — I130 Hypertensive heart and chronic kidney disease with heart failure and stage 1 through stage 4 chronic kidney disease, or unspecified chronic kidney disease: Secondary | ICD-10-CM | POA: Diagnosis present

## 2019-09-10 DIAGNOSIS — K5900 Constipation, unspecified: Secondary | ICD-10-CM | POA: Diagnosis present

## 2019-09-10 DIAGNOSIS — D751 Secondary polycythemia: Secondary | ICD-10-CM | POA: Diagnosis not present

## 2019-09-10 DIAGNOSIS — R531 Weakness: Secondary | ICD-10-CM

## 2019-09-10 DIAGNOSIS — R1313 Dysphagia, pharyngeal phase: Secondary | ICD-10-CM | POA: Diagnosis present

## 2019-09-10 DIAGNOSIS — R278 Other lack of coordination: Secondary | ICD-10-CM | POA: Diagnosis not present

## 2019-09-10 DIAGNOSIS — I428 Other cardiomyopathies: Secondary | ICD-10-CM | POA: Diagnosis present

## 2019-09-10 DIAGNOSIS — I5022 Chronic systolic (congestive) heart failure: Secondary | ICD-10-CM | POA: Diagnosis present

## 2019-09-10 DIAGNOSIS — R2681 Unsteadiness on feet: Secondary | ICD-10-CM | POA: Diagnosis not present

## 2019-09-10 DIAGNOSIS — Z841 Family history of disorders of kidney and ureter: Secondary | ICD-10-CM | POA: Diagnosis not present

## 2019-09-10 DIAGNOSIS — Z8673 Personal history of transient ischemic attack (TIA), and cerebral infarction without residual deficits: Secondary | ICD-10-CM | POA: Diagnosis not present

## 2019-09-10 DIAGNOSIS — I69391 Dysphagia following cerebral infarction: Secondary | ICD-10-CM | POA: Diagnosis not present

## 2019-09-10 DIAGNOSIS — R471 Dysarthria and anarthria: Secondary | ICD-10-CM | POA: Diagnosis present

## 2019-09-10 DIAGNOSIS — G459 Transient cerebral ischemic attack, unspecified: Secondary | ICD-10-CM | POA: Diagnosis not present

## 2019-09-10 DIAGNOSIS — E039 Hypothyroidism, unspecified: Secondary | ICD-10-CM | POA: Diagnosis present

## 2019-09-10 DIAGNOSIS — I5021 Acute systolic (congestive) heart failure: Secondary | ICD-10-CM | POA: Diagnosis not present

## 2019-09-10 DIAGNOSIS — N189 Chronic kidney disease, unspecified: Secondary | ICD-10-CM | POA: Diagnosis present

## 2019-09-10 DIAGNOSIS — R2689 Other abnormalities of gait and mobility: Secondary | ICD-10-CM | POA: Diagnosis not present

## 2019-09-10 DIAGNOSIS — M353 Polymyalgia rheumatica: Secondary | ICD-10-CM | POA: Diagnosis present

## 2019-09-10 DIAGNOSIS — I69328 Other speech and language deficits following cerebral infarction: Secondary | ICD-10-CM | POA: Diagnosis not present

## 2019-09-10 DIAGNOSIS — R29810 Facial weakness: Secondary | ICD-10-CM | POA: Diagnosis present

## 2019-09-10 DIAGNOSIS — Z87820 Personal history of traumatic brain injury: Secondary | ICD-10-CM | POA: Diagnosis not present

## 2019-09-10 DIAGNOSIS — M2550 Pain in unspecified joint: Secondary | ICD-10-CM | POA: Diagnosis not present

## 2019-09-10 DIAGNOSIS — I495 Sick sinus syndrome: Secondary | ICD-10-CM | POA: Diagnosis present

## 2019-09-10 DIAGNOSIS — H547 Unspecified visual loss: Secondary | ICD-10-CM | POA: Diagnosis present

## 2019-09-10 DIAGNOSIS — R262 Difficulty in walking, not elsewhere classified: Secondary | ICD-10-CM | POA: Diagnosis present

## 2019-09-10 DIAGNOSIS — Z87891 Personal history of nicotine dependence: Secondary | ICD-10-CM | POA: Diagnosis not present

## 2019-09-10 DIAGNOSIS — I69354 Hemiplegia and hemiparesis following cerebral infarction affecting left non-dominant side: Secondary | ICD-10-CM | POA: Diagnosis not present

## 2019-09-10 DIAGNOSIS — G8194 Hemiplegia, unspecified affecting left nondominant side: Secondary | ICD-10-CM | POA: Diagnosis present

## 2019-09-10 LAB — HEMOGLOBIN A1C
Hgb A1c MFr Bld: 5.5 % (ref 4.8–5.6)
Mean Plasma Glucose: 111.15 mg/dL

## 2019-09-10 LAB — CBC
HCT: 38.9 % — ABNORMAL LOW (ref 39.0–52.0)
Hemoglobin: 12.8 g/dL — ABNORMAL LOW (ref 13.0–17.0)
MCH: 30.5 pg (ref 26.0–34.0)
MCHC: 32.9 g/dL (ref 30.0–36.0)
MCV: 92.6 fL (ref 80.0–100.0)
Platelets: 127 10*3/uL — ABNORMAL LOW (ref 150–400)
RBC: 4.2 MIL/uL — ABNORMAL LOW (ref 4.22–5.81)
RDW: 13.4 % (ref 11.5–15.5)
WBC: 6.2 10*3/uL (ref 4.0–10.5)
nRBC: 0 % (ref 0.0–0.2)

## 2019-09-10 LAB — BASIC METABOLIC PANEL
Anion gap: 10 (ref 5–15)
BUN: 15 mg/dL (ref 8–23)
CO2: 22 mmol/L (ref 22–32)
Calcium: 9 mg/dL (ref 8.9–10.3)
Chloride: 106 mmol/L (ref 98–111)
Creatinine, Ser: 0.81 mg/dL (ref 0.61–1.24)
GFR calc Af Amer: >60 mL/min (ref >60)
GFR calc non Af Amer: >60 mL/min (ref >60)
Glucose, Bld: 99 mg/dL (ref 70–99)
Potassium: 4.3 mmol/L (ref 3.5–5.1)
Sodium: 138 mmol/L (ref 135–145)

## 2019-09-10 LAB — URINE CULTURE: Culture: NO GROWTH

## 2019-09-10 LAB — PROTIME-INR
INR: 2.1 — ABNORMAL HIGH (ref 0.8–1.2)
Prothrombin Time: 23.1 seconds — ABNORMAL HIGH (ref 11.4–15.2)

## 2019-09-10 LAB — LIPID PANEL
Cholesterol: 133 mg/dL (ref 0–200)
HDL: 41 mg/dL (ref >40)
LDL Cholesterol: 77 mg/dL (ref 0–99)
Total CHOL/HDL Ratio: 3.2 RATIO
Triglycerides: 73 mg/dL (ref <150)
VLDL: 15 mg/dL (ref 0–40)

## 2019-09-10 LAB — VITAMIN B12: Vitamin B-12: 509 pg/mL (ref 180–914)

## 2019-09-10 MED ORDER — ATORVASTATIN CALCIUM 10 MG PO TABS: 10.0000 mg | ORAL_TABLET | Freq: Every day | ORAL | Status: DC

## 2019-09-10 MED ORDER — RESOURCE THICKENUP CLEAR PO POWD
ORAL | Status: DC | PRN
  Filled 2019-09-10 (×2): qty 125

## 2019-09-10 MED ORDER — ATORVASTATIN CALCIUM 40 MG PO TABS
40.0000 mg | ORAL_TABLET | Freq: Every day | ORAL | Status: DC
  Administered 2019-09-11 – 2019-09-13 (×3): 40 mg via ORAL
  Filled 2019-09-10 (×3): qty 1

## 2019-09-10 MED ORDER — ATORVASTATIN CALCIUM 10 MG PO TABS: 20.0000 mg | ORAL_TABLET | Freq: Every day | ORAL | Status: DC

## 2019-09-10 NOTE — ED Provider Notes (Signed)
Patient signed out to me by Dr. Laverta Baltimore.  86 year old male here with weakness poor appetite.  Plan is for social work to get him a walker and then the patient to be discharged to friends to bring back to his facility Physical Exam  BP (!) 190/85   Pulse 60   Temp (!) 97.5 F (36.4 C) (Oral)   Resp (!) 22   Ht 5\' 9"  (1.753 m)   Wt 68 kg   SpO2 99%   BMI 22.15 kg/m   Physical Exam  ED Course/Procedures     Procedures  MDM  Social work was able to get the patient a walker and the patient was safely discharged.       Hayden Rasmussen, MD 09/10/19 1044

## 2019-09-10 NOTE — CV Procedure (Signed)
2D echo attempted, patient in MRI. WIll try later

## 2019-09-10 NOTE — Progress Notes (Signed)
STROKE TEAM PROGRESS NOTE   INTERVAL HISTORY I personally reviewed history of presenting illness with the patient, electronic medical records and imaging films in PACS.  He presented with sudden onset of slurred speech left-sided weakness and difficulty walking and MRI scan confirms a right insular cortex and subcortical infarct likely of embolic etiology given history of A. fib.  INR was optimal at 2.2 on admission on warfarin.  CT angiogram shows 50% left ICA stenosis with moderate changes of intracranial atherosclerosis.  Hemoglobin A1c is 5.5.  LDL cholesterol 77 mg percent.  Echocardiogram is pending.  He states his speech has improved though he still has left-sided weakness which has persisted. Vitals:   09/10/19 0415 09/10/19 0554 09/10/19 0726 09/10/19 1053  BP: (!) 184/82 (!) 186/90 (!) 158/96 136/68  Pulse: 60 61 (!) 58 60  Resp:  18 18 18   Temp:  98.4 F (36.9 C) 97.9 F (36.6 C) 97.9 F (36.6 C)  TempSrc:  Oral Oral Oral  SpO2: 94% 98% 98% 99%  Weight:      Height:        CBC:  Recent Labs  Lab 09/10/19 0621  WBC 6.2  HGB 12.8*  HCT 38.9*  MCV 92.6  PLT 127*    Basic Metabolic Panel:  Recent Labs  Lab 09/10/19 0621  NA 138  K 4.3  CL 106  CO2 22  GLUCOSE 99  BUN 15  CREATININE 0.81  CALCIUM 9.0   Lipid Panel:     Component Value Date/Time   CHOL 133 09/10/2019 0621   TRIG 73 09/10/2019 0621   HDL 41 09/10/2019 0621   CHOLHDL 3.2 09/10/2019 0621   VLDL 15 09/10/2019 0621   LDLCALC 77 09/10/2019 0621   HgbA1c:  Lab Results  Component Value Date   HGBA1C 5.5 09/10/2019   Urine Drug Screen: No results found for: LABOPIA, COCAINSCRNUR, LABBENZ, AMPHETMU, THCU, LABBARB  Alcohol Level No results found for: ETH  IMAGING past 24 hours CT Angio Head W or Wo Contrast  Result Date: 09/10/2019 CLINICAL DATA:  Follow-up examination for acute stroke. EXAM: CT ANGIOGRAPHY HEAD AND NECK TECHNIQUE: Multidetector CT imaging of the head and neck was performed  using the standard protocol during bolus administration of intravenous contrast. Multiplanar CT image reconstructions and MIPs were obtained to evaluate the vascular anatomy. Carotid stenosis measurements (when applicable) are obtained utilizing NASCET criteria, using the distal internal carotid diameter as the denominator. CONTRAST:  84mL OMNIPAQUE IOHEXOL 350 MG/ML SOLN COMPARISON:  Prior head CT from earlier the same day. FINDINGS: CT HEAD FINDINGS Brain: Generalized age-related cerebral atrophy with chronic small vessel ischemic disease. Few scatter remote lacunar infarcts noted about the bilateral basal ganglia. Small chronic right cerebellar infarct. No acute intracranial hemorrhage. No acute large vessel territory infarct. No mass lesion, midline shift or mass effect. Mild ventricular prominence related to global parenchymal volume loss without hydrocephalus. No extra-axial fluid collection. Vascular: No hyperdense vessel. Calcified atherosclerosis present at skull base. Skull: Scalp soft tissues and calvarium within normal limits. Sinuses: Mild chronic mucoperiosteal thickening noted within the ethmoidal air cells. Paranasal sinuses are otherwise clear. No mastoid effusion. Orbits: Globes and orbital soft tissues within normal limits. Review of the MIP images confirms the above findings CTA NECK FINDINGS Aortic arch: Visualized aortic arch of normal caliber with normal 3 vessel morphology. Moderate atherosclerotic change about the arch and origin of the great vessels without hemodynamically significant stenosis. Visualized subclavian arteries patent bilaterally. Right carotid system: Right common carotid  artery patent from its origin to the bifurcation without stenosis. Mild scattered calcified plaque about the right bifurcation/proximal right ICA without hemodynamically significant stenosis. Right ICA patent distally to the skull base without stenosis, dissection or occlusion. Left carotid system: Scattered  calcified plaque within the left CCA without hemodynamically significant stenosis. Concentric predominantly soft plaque about the left bifurcation with associated stenosis of up to 55% by NASCET criteria. Left ICA patent distally to the skull base without stenosis, dissection or occlusion. Vertebral arteries: Both vertebral arteries arise from the subclavian arteries. Right vertebral artery dominant. Focal plaque at the origin of the right vertebral artery with relatively mild stenosis. Scattered atheromatous irregularity within the vertebral arteries distally without additional stenosis, dissection, or occlusion. Skeleton: No acute osseous abnormality. No discrete or worrisome osseous lesions. Moderate cervical spondylosis present at C4-5 and C5-6. Other neck: No other acute soft tissue abnormality within the neck. No mass lesion or adenopathy. Upper chest: Visualized upper chest demonstrates no acute finding. Mild centrilobular emphysema. Left-sided pacemaker/AICD in place. Review of the MIP images confirms the above findings CTA HEAD FINDINGS Anterior circulation: Petrous segments widely patent bilaterally. Scattered calcified plaque throughout the cavernous/supraclinoid ICAs with associated moderate multifocal stenosis. A1 segments patent bilaterally. Normal anterior communicating artery complex. Anterior cerebral arteries patent to their distal aspects without stenosis. Short-segment mild to moderate stenosis noted within the mid right M1 segment (series 12, image 22). M1 segments otherwise patent bilaterally. Normal MCA bifurcations. Distal MCA branches well perfused and symmetric. Diffuse small vessel atheromatous irregularity. Posterior circulation: Multifocal atheromatous irregularity throughout the dominant right V4 segment without high-grade stenosis. Right PICA patent. Multifocal severe near occlusive stenoses seen involving the diminutive left V4 segment, which nearly occludes by the vertebrobasilar  junction. Left PICA patent. Moderate irregular stenosis seen involving the proximal basilar artery. Basilar diffusely irregular but otherwise patent to its distal aspect without high-grade stenosis. Superior cerebral arteries patent bilaterally. Predominant fetal type origin of both PCAs supplied via hypoplastic P1 segments and robust bilateral posterior communicating arteries. Right PCA patent to its distal aspect without high-grade stenosis. Short-segment moderate mid left P2 stenosis noted. Left PCA otherwise patent. Venous sinuses: Patent. Anatomic variants: Predominant fetal type origin of the PCAs. No intracranial aneurysm. Review of the MIP images confirms the above findings IMPRESSION: CT HEAD IMPRESSION: 1. Stable head CT.  No acute intracranial abnormality identified. 2. Age-related cerebral atrophy with chronic small vessel ischemic disease with scattered chronic lacunar infarcts as above. CTA HEAD AND NECK IMPRESSION: 1. Negative CTA for large vessel occlusion. 2. 50% atheromatous stenosis about the left carotid bifurcation. 3. Moderate multifocal atheromatous irregularity throughout the intracranial circulation as above. Notable findings include moderate multifocal stenoses about the carotid siphons, mild to moderate mid right M1 stenosis, multifocal severe near occlusive these for stenoses, irregular moderate stenosis of the proximal basilar artery, with moderate left P2 stenosis. 4.  Emphysema (ICD10-J43.9). Electronically Signed   By: Jeannine Boga M.D.   On: 09/10/2019 01:20   CT Angio Neck W and/or Wo Contrast  Result Date: 09/10/2019 CLINICAL DATA:  Follow-up examination for acute stroke. EXAM: CT ANGIOGRAPHY HEAD AND NECK TECHNIQUE: Multidetector CT imaging of the head and neck was performed using the standard protocol during bolus administration of intravenous contrast. Multiplanar CT image reconstructions and MIPs were obtained to evaluate the vascular anatomy. Carotid stenosis  measurements (when applicable) are obtained utilizing NASCET criteria, using the distal internal carotid diameter as the denominator. CONTRAST:  54mL OMNIPAQUE IOHEXOL 350 MG/ML SOLN COMPARISON:  Prior head CT from earlier the same day. FINDINGS: CT HEAD FINDINGS Brain: Generalized age-related cerebral atrophy with chronic small vessel ischemic disease. Few scatter remote lacunar infarcts noted about the bilateral basal ganglia. Small chronic right cerebellar infarct. No acute intracranial hemorrhage. No acute large vessel territory infarct. No mass lesion, midline shift or mass effect. Mild ventricular prominence related to global parenchymal volume loss without hydrocephalus. No extra-axial fluid collection. Vascular: No hyperdense vessel. Calcified atherosclerosis present at skull base. Skull: Scalp soft tissues and calvarium within normal limits. Sinuses: Mild chronic mucoperiosteal thickening noted within the ethmoidal air cells. Paranasal sinuses are otherwise clear. No mastoid effusion. Orbits: Globes and orbital soft tissues within normal limits. Review of the MIP images confirms the above findings CTA NECK FINDINGS Aortic arch: Visualized aortic arch of normal caliber with normal 3 vessel morphology. Moderate atherosclerotic change about the arch and origin of the great vessels without hemodynamically significant stenosis. Visualized subclavian arteries patent bilaterally. Right carotid system: Right common carotid artery patent from its origin to the bifurcation without stenosis. Mild scattered calcified plaque about the right bifurcation/proximal right ICA without hemodynamically significant stenosis. Right ICA patent distally to the skull base without stenosis, dissection or occlusion. Left carotid system: Scattered calcified plaque within the left CCA without hemodynamically significant stenosis. Concentric predominantly soft plaque about the left bifurcation with associated stenosis of up to 55% by  NASCET criteria. Left ICA patent distally to the skull base without stenosis, dissection or occlusion. Vertebral arteries: Both vertebral arteries arise from the subclavian arteries. Right vertebral artery dominant. Focal plaque at the origin of the right vertebral artery with relatively mild stenosis. Scattered atheromatous irregularity within the vertebral arteries distally without additional stenosis, dissection, or occlusion. Skeleton: No acute osseous abnormality. No discrete or worrisome osseous lesions. Moderate cervical spondylosis present at C4-5 and C5-6. Other neck: No other acute soft tissue abnormality within the neck. No mass lesion or adenopathy. Upper chest: Visualized upper chest demonstrates no acute finding. Mild centrilobular emphysema. Left-sided pacemaker/AICD in place. Review of the MIP images confirms the above findings CTA HEAD FINDINGS Anterior circulation: Petrous segments widely patent bilaterally. Scattered calcified plaque throughout the cavernous/supraclinoid ICAs with associated moderate multifocal stenosis. A1 segments patent bilaterally. Normal anterior communicating artery complex. Anterior cerebral arteries patent to their distal aspects without stenosis. Short-segment mild to moderate stenosis noted within the mid right M1 segment (series 12, image 22). M1 segments otherwise patent bilaterally. Normal MCA bifurcations. Distal MCA branches well perfused and symmetric. Diffuse small vessel atheromatous irregularity. Posterior circulation: Multifocal atheromatous irregularity throughout the dominant right V4 segment without high-grade stenosis. Right PICA patent. Multifocal severe near occlusive stenoses seen involving the diminutive left V4 segment, which nearly occludes by the vertebrobasilar junction. Left PICA patent. Moderate irregular stenosis seen involving the proximal basilar artery. Basilar diffusely irregular but otherwise patent to its distal aspect without high-grade  stenosis. Superior cerebral arteries patent bilaterally. Predominant fetal type origin of both PCAs supplied via hypoplastic P1 segments and robust bilateral posterior communicating arteries. Right PCA patent to its distal aspect without high-grade stenosis. Short-segment moderate mid left P2 stenosis noted. Left PCA otherwise patent. Venous sinuses: Patent. Anatomic variants: Predominant fetal type origin of the PCAs. No intracranial aneurysm. Review of the MIP images confirms the above findings IMPRESSION: CT HEAD IMPRESSION: 1. Stable head CT.  No acute intracranial abnormality identified. 2. Age-related cerebral atrophy with chronic small vessel ischemic disease with scattered chronic lacunar infarcts as above. CTA HEAD AND NECK IMPRESSION: 1.  Negative CTA for large vessel occlusion. 2. 50% atheromatous stenosis about the left carotid bifurcation. 3. Moderate multifocal atheromatous irregularity throughout the intracranial circulation as above. Notable findings include moderate multifocal stenoses about the carotid siphons, mild to moderate mid right M1 stenosis, multifocal severe near occlusive these for stenoses, irregular moderate stenosis of the proximal basilar artery, with moderate left P2 stenosis. 4.  Emphysema (ICD10-J43.9). Electronically Signed   By: Jeannine Boga M.D.   On: 09/10/2019 01:20   DG CHEST PORT 1 VIEW  Result Date: 09/10/2019 CLINICAL DATA:  Adjustment Management of cardiac pacemaker EXAM: PORTABLE CHEST 1 VIEW COMPARISON:  09/09/2019 FINDINGS: There is no focal consolidation. There is no pleural effusion or pneumothorax. The heart and mediastinal contours are unremarkable. There is thoracic aortic atherosclerosis. There is a dual lead cardiac pacemaker with the leads in satisfactory unchanged position on a single frontal AP view. There is no acute osseous abnormality. There is moderate osteoarthritis of the right glenohumeral joint. IMPRESSION: No active disease.  Electronically Signed   By: Kathreen Devoid   On: 09/10/2019 11:05   DG Swallowing Func-Speech Pathology  Result Date: 09/10/2019 Objective Swallowing Evaluation: Type of Study: MBS-Modified Barium Swallow Study  Patient Details Name: [name] MRN: [id number] Date of Birth: 06/01/1925 Today's Date: 09/10/2019 Time: SLP Start Time (ACUTE ONLY): 0919 -SLP Stop Time (ACUTE ONLY): 0940 SLP Time Calculation (min) (ACUTE ONLY): 21 min Past Medical History: Past Medical History: Diagnosis Date . Hypertension  . Hypothyroidism  . Presence of permanent cardiac pacemaker  Past Surgical History: No past surgical history on file. HPI: 86 yo male adm to [facility] with weakness, dysarthria and fall with concern for acute CVA. Pt has h/o falling and hitting his head.  Pt CT scan head showed chronic lacunar CVAs x2 basal ganglia, small right cerebellar CVA.  Pt has h/o Bell's palsy 20 years prior, remote smoking/cigar use ceasing 40 years ago.  He failed a Audiological scientist and SLP was ordered. MRI pending/delayed due to pacemaker.  Subjective: pt awake in chair Assessment / Plan / Recommendation CHL IP CLINICAL IMPRESSIONS 09/10/2019 Clinical Impression Patient presentn with moderate oropharyngeal dysphagia with sensorimotor difficulties result in discoordination with premature spillage of boluses into pharynx/larynx before swallow triggered.  Pooling to pyriform sinus noted up to 6 seconds with thin liquids.  Chin tuck posture allowed SILENT aspiration of thin and nectar consistencies - and worsened airway protection.  Control of bolus strictly to small single cup sip prevented aspiration of nectar.  Pt did have very delayed cough after aspirates - large amount of aspiration but did not clear aspirates.  Head turn left did not prevent aspiration nor decrease residuals.  Pt with mild vallecular retention with liquids and puree- Cues to "hock" helpful to decrease x1.  There were also 2 occasions where pt thought he had swallowed and he had not -  initiation difficulties noted. Recommend pt have dys3/ground meat/nectar diet and allow thin water between meals after mouth care for hydration/QOL.  Using teach back, educated pt to findings/recommendations with live monitor.  Will follow up for dysphagia management.    SLP Visit Diagnosis Dysphagia, oropharyngeal phase (R13.12) Attention and concentration deficit following -- Frontal lobe and executive function deficit following -- Impact on safety and function Moderate aspiration risk   CHL IP TREATMENT RECOMMENDATION 09/10/2019 Treatment Recommendations Defer until completion of intrumental exam   Prognosis 09/10/2019 Prognosis for Safe Diet Advancement Good Barriers to Reach Goals -- Barriers/Prognosis Comment -- CHL IP DIET RECOMMENDATION  09/10/2019 SLP Diet Recommendations Dysphagia 3 (Mech soft) solids;Nectar thick liquid Liquid Administration via Spoon;Cup;No straw Medication Administration Whole meds with puree Compensations Slow rate;Small sips/bites;Effortful swallow;Other (Comment) Postural Changes Remain semi-upright after after feeds/meals (Comment);Seated upright at 90 degrees   CHL IP OTHER RECOMMENDATIONS 09/10/2019 Recommended Consults -- Oral Care Recommendations Oral care QID Other Recommendations Order thickener from pharmacy;Have oral suction available   No flowsheet data found.  No flowsheet data found.     CHL IP ORAL PHASE 09/10/2019 Oral Phase Impaired Oral - Pudding Teaspoon -- Oral - Pudding Cup -- Oral - Honey Teaspoon -- Oral - Honey Cup -- Oral - Nectar Teaspoon Delayed oral transit;Decreased bolus cohesion;Premature spillage Oral - Nectar Cup Delayed oral transit;Decreased bolus cohesion;Premature spillage Oral - Nectar Straw Delayed oral transit;Decreased bolus cohesion;Premature spillage Oral - Thin Teaspoon Delayed oral transit;Decreased bolus cohesion;Premature spillage Oral - Thin Cup Delayed oral transit;Decreased bolus cohesion;Premature spillage Oral - Thin Straw Delayed oral  transit;Decreased bolus cohesion;Premature spillage Oral - Puree Delayed oral transit;Decreased bolus cohesion;Premature spillage Oral - Mech Soft Delayed oral transit Oral - Regular Decreased velopharyngeal closure;Nasal reflux;Delayed oral transit Oral - Multi-Consistency -- Oral - Pill -- Oral Phase - Comment --  CHL IP PHARYNGEAL PHASE 09/10/2019 Pharyngeal Phase Impaired Pharyngeal- Pudding Teaspoon -- Pharyngeal -- Pharyngeal- Pudding Cup -- Pharyngeal -- Pharyngeal- Honey Teaspoon -- Pharyngeal -- Pharyngeal- Honey Cup -- Pharyngeal -- Pharyngeal- Nectar Teaspoon Delayed swallow initiation-pyriform sinuses Pharyngeal Material does not enter airway Pharyngeal- Nectar Cup Delayed swallow initiation-pyriform sinuses Pharyngeal Material does not enter airway Pharyngeal- Nectar Straw Delayed swallow initiation-pyriform sinuses;Penetration/Aspiration before swallow;Moderate aspiration Pharyngeal Material enters airway, passes BELOW cords without attempt by patient to eject out (silent aspiration) Pharyngeal- Thin Teaspoon Delayed swallow initiation-vallecula;Pharyngeal residue - valleculae Pharyngeal Material does not enter airway Pharyngeal- Thin Cup Delayed swallow initiation-pyriform sinuses;Reduced anterior laryngeal mobility;Reduced laryngeal elevation;Reduced airway/laryngeal closure;Penetration/Aspiration before swallow Pharyngeal Material enters airway, passes BELOW cords without attempt by patient to eject out (silent aspiration) Pharyngeal- Thin Straw Delayed swallow initiation-pyriform sinuses Pharyngeal Material enters airway, passes BELOW cords without attempt by patient to eject out (silent aspiration) Pharyngeal- Puree Delayed swallow initiation-vallecula;Reduced pharyngeal peristalsis;Reduced epiglottic inversion;Reduced tongue base retraction;Pharyngeal residue - valleculae Pharyngeal -- Pharyngeal- Mechanical Soft Delayed swallow initiation-vallecula Pharyngeal -- Pharyngeal- Regular -- Pharyngeal  -- Pharyngeal- Multi-consistency -- Pharyngeal -- Pharyngeal- Pill -- Pharyngeal -- Pharyngeal Comment nectar via cup with head turn left did not prevent aspiration, allowed trace aspiration posterior trach, chin tuck worsened airway protection with gross spillage over epiglottis, During MBS, pt stated "I swallowed" x2 when he had not.  Controlling amount of nectar strictly prevented aspiration but pt at higher risk due to silent nature of aspiration.  Larger liquid bolus faciliated efficient swallow.    CHL IP CERVICAL ESOPHAGEAL PHASE 09/10/2019 Cervical Esophageal Phase WFL  Upon esophageal sweep, pt appeared clear Pudding Teaspoon -- Pudding Cup -- Honey Teaspoon -- Honey Cup -- Nectar Teaspoon -- Nectar Cup -- Nectar Straw -- Thin Teaspoon -- Thin Cup -- Thin Straw -- Puree -- Mechanical Soft -- Regular -- Multi-consistency -- Pill -- Cervical Esophageal Comment -- Kathleen Lime, MS [facility] SLP Acute Rehab Services Office [phone] [name] 09/10/2019, 10:44 AM               PHYSICAL EXAM Pleasant elderly Caucasian male not in distress. . Afebrile. Head is nontraumatic. Neck is supple without bruit.    Cardiac exam no murmur or gallop. Lungs are clear to auscultation. Distal pulses are well  felt. Neurological Exam ;Awake alert oriented x 3 normal speech and language. Mild left lower face asymmetry. Tongue midline.  Mild left upper and lower extremity drift. Mild diminished fine finger movements on left. Orbits right over left upper extremity. Mild left grip weak.. Normal sensation . Normal coordination. Gait not tested ASSESSMENT/PLAN [name] is a 86 y.o. male with history of hypertension, chronic systolic heart failure, polymyalgia rheumatica, paroxysmal atrial fibrillation, CKD, nonischemic cardiomyopathy, hyperlipidemia, hypothyroidism, traumatic intraparenchymal right frontal bleed in January 2021,  presenting with slurred speech, difficulty walking, left-sided weakness. D/c from ED w/  neg CT head. [place] getting out of the car and brought back to hospital with worsened L sided weakness.  Stroke:   R MCA infarct most likely embolic secondary to AF on warfarin,   CT head No acute abnormality. Small vessel disease. Atrophy. Old scattered lacunes  CTA head & neck no LVO. L ICA bifurcation 50% stenosis. Moderate multifocal intracranial atherosclerosis.   MRI  R deep insula and radiating white matter infarct. Small vessel disease. Atrophy. Old R frontal white matter infarct. Abnormal flow distal L VA, unrelated.  2D Echo pending   LDL 77  HgbA1c 5.5  Warfarin for VTE prophylaxis  warfarin daily prior to admission w/ INR 2.0, now on warfarin daily.  Recommend change warfarin to Eliquis  Therapy recommendations:  SNF  Disposition:  Pending   Atrial Fibrillation  Home anticoagulation:  warfarin daily continued in the hospital. Pharmacy on board  Last INR 2.0, today's pending   Hypertension  Stable . Permissive hypertension (OK if < 220/120) but gradually normalize in 5-7 days . Long-term BP goal normotensive  Hyperlipidemia  Home meds:  lipitor 10, resumed in hospital  LDL 77, goal < 70  Increase statin to lipitor 20 given acute stroke   Continue statin at discharge  Dysphagia . Secondary to stroke . Cleared for D3 nectar thick liquids . Speech on board   Other Stroke Risk Factors  Advanced age  former Cigarette smoker  ETOH use, advised to drink no more than 2 drink(s) a day  Coronary artery disease  NICM s/p pacer  Hx chronic systolic CHF  Other Active Problems  PMR  Hx traumatic R frontal lobe hmg Jan 2021 following a fall  Hypothyroidism  On synthroid   Hospital day # 0 He presented with embolic right MCA branch infarct despite being on optimal anticoagulation with warfarin for 2.0 from his A. fib.  Recommend switch warfarin to Eliquis for secondary stroke prevention as he has failed warfarin.  Continue ongoing aggressive risk  factor modification and physical occupational and speech therapy consults.  He will likely need to be transferred to rehab when bed becomes available.  Discussed with Dr. Posey Pronto and answered questions.  Greater than 50% time during this 35-minute visit was spent on counseling and coordination of care and answering questions. Antony Contras, MD Medical Director [place] Pager: [phone] 09/10/2019 4:41 PM  To contact Stroke Continuity provider, please refer to [email or web address] After hours, contact General Neurology

## 2019-09-10 NOTE — Progress Notes (Signed)
  Speech Language Pathology Treatment: Dysphagia  Patient Details Name: [name] MRN: [id number] DOB: 19-Jul-1925 Today's Date: 09/10/2019 Time: [id number] SLP Time Calculation (min) (ACUTE ONLY): 13 min  Assessment / Plan / Recommendation Clinical Impression  Pt informed to dysphagia precautions found to be helpful during MBS, SLP set up oral suction for his use prn and demonstrated use to pt. He appeared to be distracted by the suction with decreased ability to sustain attention to tasks.  Pt read swallow precautions using teach back with mod=max cues.  Recommend full supervision initially to mitigate aspiration given need to control amount consumed carefully = very small cup boluses of nectar advised.  Pt advised understanding to information and need to strengthen cough and "hock" for airway protection.    HPI HPI: 86 yo male adm to [facility] with weakness, dysarthria and fall with concern for acute CVA. Pt has h/o falling and hitting his head.  Pt CT scan head showed chronic lacunar CVAs x2 basal ganglia, small right cerebellar CVA.  Pt has h/o Bell's palsy 20 years prior, remote smoking/cigar use ceasing 40 years ago.  He failed a Audiological scientist and SLP was ordered. MRI pending/delayed due to pacemaker.      SLP Plan  Continue with current plan of care       Recommendations  Diet recommendations: Dysphagia 3 (mechanical soft);Nectar-thick liquid Liquids provided via: Cup;No straw;Teaspoon Medication Administration: Whole meds with puree Supervision: Full supervision/cueing for compensatory strategies Compensations: Slow rate;Small sips/bites;Effortful swallow;Other (Comment)(intermittent cough "hock") Postural Changes and/or Swallow Maneuvers: Seated upright 90 degrees;Upright 30-60 min after meal                Oral Care Recommendations: Oral care QID SLP Visit Diagnosis: Dysphagia, oropharyngeal phase (R13.12) Plan: Continue with current plan of care       GO                Kathleen Lime, MS [place]  [name] 09/10/2019, 12:46 PM

## 2019-09-10 NOTE — Progress Notes (Signed)
[facility] Progress Note  Patient: [name]    [id number]  DOA: 09/09/2019     Date of Service: the patient was seen and examined on 09/10/2019  Chief Complaint  Patient presents with  . Fall   Brief hospital course: Past medical history of HTN, hypothyroidism, chronic systolic CHF, A. fib on Coumadin, traumatic ICH in January 2021, PPM implant.  Patient presented with left-sided weakness found to have acute stroke on MRI. Currently further plan is arranged rehab and complete stroke work-up.  Assessment and Plan: 1.  Acute CVA with left-sided weakness Likely embolic secondary to A. fib CT head no acute abnormality. CTA head and neck no significant normality. Left ICA bifurcation 50% stenosis. MRI brain positive for right deep insula and radiating white matter infarct. Neurology consulted. Currently recommending changing from warfarin to Eliquis for A. fib. Hemoglobin A1c 5.5. LDL 77.  Patient will be on Lipitor 40 mg daily.  2.  Chronic A. fib Patient on Coumadin. INR therapeutic at the time of admission. Currently we will transition to Eliquis per neurology recommendation. CT MRI negative for any acute bleeding.  3.  Essential hypertension Permissive hypertension for now. Monitor.  4.  Hyperlipidemia On Lipitor 10 mg daily. 02 March 2019 milligrams.  5.  Dysphagia Currently on dysphagia 3 diet nectar thick liquid per speech therapy. Underwent modified barium swallowing.  6.  Chronic systolic CHF. CAD. Nonischemic cardiomyopathy. Pacemaker implant. Currently stable. Monitor on telemetry.  7.  Polymyalgia rheumatica Not on prednisone. Monitor.  8.  Hypothyroidism Continue Synthroid.  9.  Recent intracranial hemorrhage After a fall in January 21. Monitor.  Diet: Dysphagia diet DVT Prophylaxis: Therapeutic Anticoagulation with Eliquis   Advance goals of care discussion: Full code  Family Communication: no family was present at bedside,  at the time of interview.   Disposition:  Status is: Inpatient  Remains inpatient appropriate because:Ongoing diagnostic testing needed not appropriate for outpatient work up   Dispo: The patient is from: Home              Anticipated d/c is to: SNF              Anticipated d/c date is: 2 days              Patient currently is not medically stable to d/c.  Subjective: No nausea no vomiting.  No fever no chills.  No chest pain abdomen.  No diarrhea no constipation.  Continues to have left-sided weakness.  Continues to have speech difficulty.  Physical Exam: General:  alert oriented to time, place, and person.  Appear in mild distress, affect appropriate Eyes: PERRL ENT: Oral Mucosa Clear, moist  Neck: no JVD,  Skin: no rash Extremities: no Pedal edema,  Neurologic: Alert awake and oriented. Mild dysarthria. Left facial droop. Midline tongue. Left upper and lower extremity motor weakness. Positive pronator drift. Normal sensation. Normal finger-nose-finger test other than limitation due to weakness on the left.  Gait not checked due to patient safety concerns  Vitals:   09/10/19 0554 09/10/19 0726 09/10/19 1053 09/10/19 1642  BP: (!) 186/90 (!) 158/96 136/68 (!) 147/63  Pulse: 61 (!) 58 60 (!) 59  Resp: 18 18 18 18   Temp: 98.4 F (36.9 C) 97.9 F (36.6 C) 97.9 F (36.6 C) (!) 97.3 F (36.3 C)  TempSrc: Oral Oral Oral Oral  SpO2: 98% 98% 99% 99%  Weight:      Height:        Intake/Output Summary (  Last 24 hours) at 09/10/2019 2002 Last data filed at 09/10/2019 1400 Gross per 24 hour  Intake 0 ml  Output 100 ml  Net -100 ml   Filed Weights   09/09/19 2123  Weight: 68 kg    Data Reviewed: I have personally reviewed and interpreted daily labs, tele strips, imagings as discussed above. I reviewed all nursing notes, pharmacy notes, vitals, pertinent old records I have discussed plan of care as described above with RN and patient/family.  CBC: Recent Labs  Lab  09/10/19 0621  WBC 6.2  HGB 12.8*  HCT 38.9*  MCV 92.6  PLT [id number]   Basic Metabolic Panel: Recent Labs  Lab 09/10/19 0621  NA 138  K 4.3  CL 106  CO2 22  GLUCOSE 99  BUN 15  CREATININE 0.81  CALCIUM 9.0    Studies: CT Angio Head W or Wo Contrast  Result Date: 09/10/2019 CLINICAL DATA:  Follow-up examination for acute stroke. EXAM: CT ANGIOGRAPHY HEAD AND NECK TECHNIQUE: Multidetector CT imaging of the head and neck was performed using the standard protocol during bolus administration of intravenous contrast. Multiplanar CT image reconstructions and MIPs were obtained to evaluate the vascular anatomy. Carotid stenosis measurements (when applicable) are obtained utilizing NASCET criteria, using the distal internal carotid diameter as the denominator. CONTRAST:  55mL OMNIPAQUE IOHEXOL 350 MG/ML SOLN COMPARISON:  Prior head CT from earlier the same day. FINDINGS: CT HEAD FINDINGS Brain: Generalized age-related cerebral atrophy with chronic small vessel ischemic disease. Few scatter remote lacunar infarcts noted about the bilateral basal ganglia. Small chronic right cerebellar infarct. No acute intracranial hemorrhage. No acute large vessel territory infarct. No mass lesion, midline shift or mass effect. Mild ventricular prominence related to global parenchymal volume loss without hydrocephalus. No extra-axial fluid collection. Vascular: No hyperdense vessel. Calcified atherosclerosis present at skull base. Skull: Scalp soft tissues and calvarium within normal limits. Sinuses: Mild chronic mucoperiosteal thickening noted within the ethmoidal air cells. Paranasal sinuses are otherwise clear. No mastoid effusion. Orbits: Globes and orbital soft tissues within normal limits. Review of the MIP images confirms the above findings CTA NECK FINDINGS Aortic arch: Visualized aortic arch of normal caliber with normal 3 vessel morphology. Moderate atherosclerotic change about the arch and origin of the great  vessels without hemodynamically significant stenosis. Visualized subclavian arteries patent bilaterally. Right carotid system: Right common carotid artery patent from its origin to the bifurcation without stenosis. Mild scattered calcified plaque about the right bifurcation/proximal right ICA without hemodynamically significant stenosis. Right ICA patent distally to the skull base without stenosis, dissection or occlusion. Left carotid system: Scattered calcified plaque within the left CCA without hemodynamically significant stenosis. Concentric predominantly soft plaque about the left bifurcation with associated stenosis of up to 55% by NASCET criteria. Left ICA patent distally to the skull base without stenosis, dissection or occlusion. Vertebral arteries: Both vertebral arteries arise from the subclavian arteries. Right vertebral artery dominant. Focal plaque at the origin of the right vertebral artery with relatively mild stenosis. Scattered atheromatous irregularity within the vertebral arteries distally without additional stenosis, dissection, or occlusion. Skeleton: No acute osseous abnormality. No discrete or worrisome osseous lesions. Moderate cervical spondylosis present at C4-5 and C5-6. Other neck: No other acute soft tissue abnormality within the neck. No mass lesion or adenopathy. Upper chest: Visualized upper chest demonstrates no acute finding. Mild centrilobular emphysema. Left-sided pacemaker/AICD in place. Review of the MIP images confirms the above findings CTA HEAD FINDINGS Anterior circulation: Petrous segments widely patent bilaterally.  Scattered calcified plaque throughout the cavernous/supraclinoid ICAs with associated moderate multifocal stenosis. A1 segments patent bilaterally. Normal anterior communicating artery complex. Anterior cerebral arteries patent to their distal aspects without stenosis. Short-segment mild to moderate stenosis noted within the mid right M1 segment (series 12,  image 22). M1 segments otherwise patent bilaterally. Normal MCA bifurcations. Distal MCA branches well perfused and symmetric. Diffuse small vessel atheromatous irregularity. Posterior circulation: Multifocal atheromatous irregularity throughout the dominant right V4 segment without high-grade stenosis. Right PICA patent. Multifocal severe near occlusive stenoses seen involving the diminutive left V4 segment, which nearly occludes by the vertebrobasilar junction. Left PICA patent. Moderate irregular stenosis seen involving the proximal basilar artery. Basilar diffusely irregular but otherwise patent to its distal aspect without high-grade stenosis. Superior cerebral arteries patent bilaterally. Predominant fetal type origin of both PCAs supplied via hypoplastic P1 segments and robust bilateral posterior communicating arteries. Right PCA patent to its distal aspect without high-grade stenosis. Short-segment moderate mid left P2 stenosis noted. Left PCA otherwise patent. Venous sinuses: Patent. Anatomic variants: Predominant fetal type origin of the PCAs. No intracranial aneurysm. Review of the MIP images confirms the above findings IMPRESSION: CT HEAD IMPRESSION: 1. Stable head CT.  No acute intracranial abnormality identified. 2. Age-related cerebral atrophy with chronic small vessel ischemic disease with scattered chronic lacunar infarcts as above. CTA HEAD AND NECK IMPRESSION: 1. Negative CTA for large vessel occlusion. 2. 50% atheromatous stenosis about the left carotid bifurcation. 3. Moderate multifocal atheromatous irregularity throughout the intracranial circulation as above. Notable findings include moderate multifocal stenoses about the carotid siphons, mild to moderate mid right M1 stenosis, multifocal severe near occlusive these for stenoses, irregular moderate stenosis of the proximal basilar artery, with moderate left P2 stenosis. 4.  Emphysema (ICD10-J43.9). Electronically Signed   By: Jeannine Boga M.D.   On: 09/10/2019 01:20   CT Angio Neck W and/or Wo Contrast  Result Date: 09/10/2019 CLINICAL DATA:  Follow-up examination for acute stroke. EXAM: CT ANGIOGRAPHY HEAD AND NECK TECHNIQUE: Multidetector CT imaging of the head and neck was performed using the standard protocol during bolus administration of intravenous contrast. Multiplanar CT image reconstructions and MIPs were obtained to evaluate the vascular anatomy. Carotid stenosis measurements (when applicable) are obtained utilizing NASCET criteria, using the distal internal carotid diameter as the denominator. CONTRAST:  60mL OMNIPAQUE IOHEXOL 350 MG/ML SOLN COMPARISON:  Prior head CT from earlier the same day. FINDINGS: CT HEAD FINDINGS Brain: Generalized age-related cerebral atrophy with chronic small vessel ischemic disease. Few scatter remote lacunar infarcts noted about the bilateral basal ganglia. Small chronic right cerebellar infarct. No acute intracranial hemorrhage. No acute large vessel territory infarct. No mass lesion, midline shift or mass effect. Mild ventricular prominence related to global parenchymal volume loss without hydrocephalus. No extra-axial fluid collection. Vascular: No hyperdense vessel. Calcified atherosclerosis present at skull base. Skull: Scalp soft tissues and calvarium within normal limits. Sinuses: Mild chronic mucoperiosteal thickening noted within the ethmoidal air cells. Paranasal sinuses are otherwise clear. No mastoid effusion. Orbits: Globes and orbital soft tissues within normal limits. Review of the MIP images confirms the above findings CTA NECK FINDINGS Aortic arch: Visualized aortic arch of normal caliber with normal 3 vessel morphology. Moderate atherosclerotic change about the arch and origin of the great vessels without hemodynamically significant stenosis. Visualized subclavian arteries patent bilaterally. Right carotid system: Right common carotid artery patent from its origin to the  bifurcation without stenosis. Mild scattered calcified plaque about the right bifurcation/proximal right ICA without hemodynamically  significant stenosis. Right ICA patent distally to the skull base without stenosis, dissection or occlusion. Left carotid system: Scattered calcified plaque within the left CCA without hemodynamically significant stenosis. Concentric predominantly soft plaque about the left bifurcation with associated stenosis of up to 55% by NASCET criteria. Left ICA patent distally to the skull base without stenosis, dissection or occlusion. Vertebral arteries: Both vertebral arteries arise from the subclavian arteries. Right vertebral artery dominant. Focal plaque at the origin of the right vertebral artery with relatively mild stenosis. Scattered atheromatous irregularity within the vertebral arteries distally without additional stenosis, dissection, or occlusion. Skeleton: No acute osseous abnormality. No discrete or worrisome osseous lesions. Moderate cervical spondylosis present at C4-5 and C5-6. Other neck: No other acute soft tissue abnormality within the neck. No mass lesion or adenopathy. Upper chest: Visualized upper chest demonstrates no acute finding. Mild centrilobular emphysema. Left-sided pacemaker/AICD in place. Review of the MIP images confirms the above findings CTA HEAD FINDINGS Anterior circulation: Petrous segments widely patent bilaterally. Scattered calcified plaque throughout the cavernous/supraclinoid ICAs with associated moderate multifocal stenosis. A1 segments patent bilaterally. Normal anterior communicating artery complex. Anterior cerebral arteries patent to their distal aspects without stenosis. Short-segment mild to moderate stenosis noted within the mid right M1 segment (series 12, image 22). M1 segments otherwise patent bilaterally. Normal MCA bifurcations. Distal MCA branches well perfused and symmetric. Diffuse small vessel atheromatous irregularity. Posterior  circulation: Multifocal atheromatous irregularity throughout the dominant right V4 segment without high-grade stenosis. Right PICA patent. Multifocal severe near occlusive stenoses seen involving the diminutive left V4 segment, which nearly occludes by the vertebrobasilar junction. Left PICA patent. Moderate irregular stenosis seen involving the proximal basilar artery. Basilar diffusely irregular but otherwise patent to its distal aspect without high-grade stenosis. Superior cerebral arteries patent bilaterally. Predominant fetal type origin of both PCAs supplied via hypoplastic P1 segments and robust bilateral posterior communicating arteries. Right PCA patent to its distal aspect without high-grade stenosis. Short-segment moderate mid left P2 stenosis noted. Left PCA otherwise patent. Venous sinuses: Patent. Anatomic variants: Predominant fetal type origin of the PCAs. No intracranial aneurysm. Review of the MIP images confirms the above findings IMPRESSION: CT HEAD IMPRESSION: 1. Stable head CT.  No acute intracranial abnormality identified. 2. Age-related cerebral atrophy with chronic small vessel ischemic disease with scattered chronic lacunar infarcts as above. CTA HEAD AND NECK IMPRESSION: 1. Negative CTA for large vessel occlusion. 2. 50% atheromatous stenosis about the left carotid bifurcation. 3. Moderate multifocal atheromatous irregularity throughout the intracranial circulation as above. Notable findings include moderate multifocal stenoses about the carotid siphons, mild to moderate mid right M1 stenosis, multifocal severe near occlusive these for stenoses, irregular moderate stenosis of the proximal basilar artery, with moderate left P2 stenosis. 4.  Emphysema (ICD10-J43.9). Electronically Signed   By: Jeannine Boga M.D.   On: 09/10/2019 01:20   MR BRAIN WO CONTRAST  Result Date: 09/10/2019 CLINICAL DATA:  Generalized weakness. Mental status changes. Negative CT evaluation. EXAM: MRI HEAD  WITHOUT CONTRAST TECHNIQUE: Multiplanar, multiecho pulse sequences of the brain and surrounding structures were obtained without intravenous contrast. COMPARISON:  CT angiography 09/09/2019. FINDINGS: Brain: Diffusion imaging shows a small region of acute infarction affecting the right deep insular cortex in the underlying radiating white matter tracts. No evidence of hemorrhage or mass effect. No other acute infarction. Elsewhere, there are old small vessel ischemic changes of the pons. Old small vessel cerebellar infarction on the right. Moderate changes of chronic small vessel disease of the cerebral hemispheric  white matter. No mass lesion, recent hemorrhage, hydrocephalus or extra-axial collection. Small focus of hemosiderin deposition in the right frontal white matter related to an old insult. Vascular: Flow is present within the major vessels at the base of the brain, with the exception of the left vertebral artery which shows abnormal flow, better demonstrated at CT angiography. Skull and upper cervical spine: Negative Sinuses/Orbits: Clear/normal Other: None IMPRESSION: Acute infarction in the right deep insula and radiating white matter tracts. No mass effect or hemorrhage. Atrophy and chronic small-vessel ischemic changes affecting the brain elsewhere as described. Old right frontal white matter hemorrhage. Abnormal flow in the distal left vertebral artery as shown by CT angiography, presumably unrelated to the acute insult. Electronically Signed   By: Nelson Chimes M.D.   On: 09/10/2019 14:17   DG CHEST PORT 1 VIEW  Result Date: 09/10/2019 CLINICAL DATA:  Adjustment Management of cardiac pacemaker EXAM: PORTABLE CHEST 1 VIEW COMPARISON:  09/09/2019 FINDINGS: There is no focal consolidation. There is no pleural effusion or pneumothorax. The heart and mediastinal contours are unremarkable. There is thoracic aortic atherosclerosis. There is a dual lead cardiac pacemaker with the leads in satisfactory  unchanged position on a single frontal AP view. There is no acute osseous abnormality. There is moderate osteoarthritis of the right glenohumeral joint. IMPRESSION: No active disease. Electronically Signed   By: Kathreen Devoid   On: 09/10/2019 11:05   DG Swallowing Func-Speech Pathology  Result Date: 09/10/2019 Objective Swallowing Evaluation: Type of Study: MBS-Modified Barium Swallow Study  Patient Details Name: SALIOU MCILVAIN MRN: [id number] Date of Birth: 23-Apr-1926 Today's Date: 09/10/2019 Time: SLP Start Time (ACUTE ONLY): 0919 -SLP Stop Time (ACUTE ONLY): 0940 SLP Time Calculation (min) (ACUTE ONLY): 21 min Past Medical History: Past Medical History: Diagnosis Date . Hypertension  . Hypothyroidism  . Presence of permanent cardiac pacemaker  Past Surgical History: No past surgical history on file. HPI: 86 yo male adm to [facility] with weakness, dysarthria and fall with concern for acute CVA. Pt has h/o falling and hitting his head.  Pt CT scan head showed chronic lacunar CVAs x2 basal ganglia, small right cerebellar CVA.  Pt has h/o Bell's palsy 20 years prior, remote smoking/cigar use ceasing 40 years ago.  He failed a Audiological scientist and SLP was ordered. MRI pending/delayed due to pacemaker.  Subjective: pt awake in chair Assessment / Plan / Recommendation CHL IP CLINICAL IMPRESSIONS 09/10/2019 Clinical Impression Patient presentn with moderate oropharyngeal dysphagia with sensorimotor difficulties result in discoordination with premature spillage of boluses into pharynx/larynx before swallow triggered.  Pooling to pyriform sinus noted up to 6 seconds with thin liquids.  Chin tuck posture allowed SILENT aspiration of thin and nectar consistencies - and worsened airway protection.  Control of bolus strictly to small single cup sip prevented aspiration of nectar.  Pt did have very delayed cough after aspirates - large amount of aspiration but did not clear aspirates.  Head turn left did not prevent aspiration nor decrease  residuals.  Pt with mild vallecular retention with liquids and puree- Cues to "hock" helpful to decrease x1.  There were also 2 occasions where pt thought he had swallowed and he had not - initiation difficulties noted. Recommend pt have dys3/ground meat/nectar diet and allow thin water between meals after mouth care for hydration/QOL.  Using teach back, educated pt to findings/recommendations with live monitor.  Will follow up for dysphagia management.    SLP Visit Diagnosis Dysphagia, oropharyngeal phase (R13.12) Attention and concentration  deficit following -- Frontal lobe and executive function deficit following -- Impact on safety and function Moderate aspiration risk   CHL IP TREATMENT RECOMMENDATION 09/10/2019 Treatment Recommendations Defer until completion of intrumental exam   Prognosis 09/10/2019 Prognosis for Safe Diet Advancement Good Barriers to Reach Goals -- Barriers/Prognosis Comment -- CHL IP DIET RECOMMENDATION 09/10/2019 SLP Diet Recommendations Dysphagia 3 (Mech soft) solids;Nectar thick liquid Liquid Administration via Spoon;Cup;No straw Medication Administration Whole meds with puree Compensations Slow rate;Small sips/bites;Effortful swallow;Other (Comment) Postural Changes Remain semi-upright after after feeds/meals (Comment);Seated upright at 90 degrees   CHL IP OTHER RECOMMENDATIONS 09/10/2019 Recommended Consults -- Oral Care Recommendations Oral care QID Other Recommendations Order thickener from pharmacy;Have oral suction available   No flowsheet data found.  No flowsheet data found.     CHL IP ORAL PHASE 09/10/2019 Oral Phase Impaired Oral - Pudding Teaspoon -- Oral - Pudding Cup -- Oral - Honey Teaspoon -- Oral - Honey Cup -- Oral - Nectar Teaspoon Delayed oral transit;Decreased bolus cohesion;Premature spillage Oral - Nectar Cup Delayed oral transit;Decreased bolus cohesion;Premature spillage Oral - Nectar Straw Delayed oral transit;Decreased bolus cohesion;Premature spillage Oral - Thin  Teaspoon Delayed oral transit;Decreased bolus cohesion;Premature spillage Oral - Thin Cup Delayed oral transit;Decreased bolus cohesion;Premature spillage Oral - Thin Straw Delayed oral transit;Decreased bolus cohesion;Premature spillage Oral - Puree Delayed oral transit;Decreased bolus cohesion;Premature spillage Oral - Mech Soft Delayed oral transit Oral - Regular Decreased velopharyngeal closure;Nasal reflux;Delayed oral transit Oral - Multi-Consistency -- Oral - Pill -- Oral Phase - Comment --  CHL IP PHARYNGEAL PHASE 09/10/2019 Pharyngeal Phase Impaired Pharyngeal- Pudding Teaspoon -- Pharyngeal -- Pharyngeal- Pudding Cup -- Pharyngeal -- Pharyngeal- Honey Teaspoon -- Pharyngeal -- Pharyngeal- Honey Cup -- Pharyngeal -- Pharyngeal- Nectar Teaspoon Delayed swallow initiation-pyriform sinuses Pharyngeal Material does not enter airway Pharyngeal- Nectar Cup Delayed swallow initiation-pyriform sinuses Pharyngeal Material does not enter airway Pharyngeal- Nectar Straw Delayed swallow initiation-pyriform sinuses;Penetration/Aspiration before swallow;Moderate aspiration Pharyngeal Material enters airway, passes BELOW cords without attempt by patient to eject out (silent aspiration) Pharyngeal- Thin Teaspoon Delayed swallow initiation-vallecula;Pharyngeal residue - valleculae Pharyngeal Material does not enter airway Pharyngeal- Thin Cup Delayed swallow initiation-pyriform sinuses;Reduced anterior laryngeal mobility;Reduced laryngeal elevation;Reduced airway/laryngeal closure;Penetration/Aspiration before swallow Pharyngeal Material enters airway, passes BELOW cords without attempt by patient to eject out (silent aspiration) Pharyngeal- Thin Straw Delayed swallow initiation-pyriform sinuses Pharyngeal Material enters airway, passes BELOW cords without attempt by patient to eject out (silent aspiration) Pharyngeal- Puree Delayed swallow initiation-vallecula;Reduced pharyngeal peristalsis;Reduced epiglottic  inversion;Reduced tongue base retraction;Pharyngeal residue - valleculae Pharyngeal -- Pharyngeal- Mechanical Soft Delayed swallow initiation-vallecula Pharyngeal -- Pharyngeal- Regular -- Pharyngeal -- Pharyngeal- Multi-consistency -- Pharyngeal -- Pharyngeal- Pill -- Pharyngeal -- Pharyngeal Comment nectar via cup with head turn left did not prevent aspiration, allowed trace aspiration posterior trach, chin tuck worsened airway protection with gross spillage over epiglottis, During MBS, pt stated "I swallowed" x2 when he had not.  Controlling amount of nectar strictly prevented aspiration but pt at higher risk due to silent nature of aspiration.  Larger liquid bolus faciliated efficient swallow.    CHL IP CERVICAL ESOPHAGEAL PHASE 09/10/2019 Cervical Esophageal Phase WFL  Upon esophageal sweep, pt appeared clear Pudding Teaspoon -- Pudding Cup -- Honey Teaspoon -- Honey Cup -- Nectar Teaspoon -- Nectar Cup -- Nectar Straw -- Thin Teaspoon -- Thin Cup -- Thin Straw -- Puree -- Mechanical Soft -- Regular -- Multi-consistency -- Pill -- Cervical Esophageal Comment -- Kathleen Lime, MS [facility] SLP Acute [employer] [phone] [place],  Marguerite Olea 09/10/2019, 10:44 AM               Scheduled Meds: .  stroke: mapping our early stages of recovery book   Does not apply Once  . [START ON 09/11/2019] atorvastatin  40 mg Oral Daily  . sodium chloride flush  3 mL Intravenous Q12H   Continuous Infusions: . sodium chloride     PRN Meds: sodium chloride, acetaminophen **OR** acetaminophen (TYLENOL) oral liquid 160 mg/5 mL **OR** acetaminophen, labetalol, Resource ThickenUp Clear, senna-docusate, sodium chloride flush  Time spent: 35 minutes  Author: Berle Mull, MD [facility] 09/10/2019 8:02 PM  To reach On-call, see care teams to locate the attending and reach out to them via [email or web address]. If 7PM-7AM, please contact night-coverage If you still have difficulty reaching the attending provider, please  page the [facility] (Director on Call) for [facility] on amion for assistance.

## 2019-09-10 NOTE — Progress Notes (Signed)
ANTICOAGULATION CONSULT NOTE   Pharmacy Consult for warfarin>> apixaban Indication: atrial fibrillation  No Known Allergies  Patient Measurements: Height: 5\' 9"  (175.3 cm) Weight: 68 kg (150 lb) IBW/kg (Calculated) : 70.7   Vital Signs: Temp: 97.3 F (36.3 C) (05/10 1642) Temp Source: Oral (05/10 1642) BP: 147/63 (05/10 1642) Pulse Rate: 59 (05/10 1642)  Labs: Recent Labs    09/09/19 2219 09/10/19 0621 09/10/19 1601  HGB  --  12.8*  --   HCT  --  38.9*  --   PLT  --  127*  --   LABPROT 22.3*  --  23.1*  INR 2.0*  --  2.1*  CREATININE  --  0.81  --     Estimated Creatinine Clearance: 57.1 mL/min (by C-G formula based on SCr of 0.81 mg/dL).   Medical History: Past Medical History:  Diagnosis Date  . Hypertension   . Hypothyroidism   . Presence of permanent cardiac pacemaker      Assessment: 70 yoM with hx AFib on warfarin admitted with slurred speech and found to have CVA. Pharmacy to transition to [place] -INR= 2.1  The manufacturer of apixaban recommends starting apixaban when INR < 2.0  *PTA dose = 3mg  Mon/Fri, 4mg  all other days  Goal of Therapy:  INR 2-3 Monitor platelets by anticoagulation protocol: Yes   Plan:  -Warfarin discontinued -Start apixaban when INR < 2.0 -Daily PT/INR  Hildred Laser, PharmD Clinical Pharmacist **Pharmacist phone directory can now be found on [email or web address] ([email or web address]).  Listed under [place].

## 2019-09-10 NOTE — Progress Notes (Signed)
Changed device settings for MRI to AOO at 75 bpm  Per orders     Will Program device back to pre-MRI settings after completion of exam.

## 2019-09-10 NOTE — Progress Notes (Signed)
Informed of MRI for today.   Device system confirmed to be MRI conditional, with implant date > 6 weeks ago and no evidence of abandoned or epicardial leads in review of most recent CXR Interrogation from today reviewed, pt is currently AP-VS at 62 bpm Change device settings for MRI to AOO at 75 bpm  Tachy-therapies to off if applicable.  Program device back to pre-MRI settings after completion of exam.  Annamaria Helling  09/10/2019 12:57 PM

## 2019-09-10 NOTE — Social Work (Signed)
CSW met with pt at bedside. CSW introduced self and explained her role. CSW completed sbirt with pt.  Pt scored a 0 on the sbirt scale. Pt denied alcohol use. Pt denied substance use. Pt did not need resources at this time.  Emeterio Reeve, Latanya Presser, [place] Social Worker [phone]

## 2019-09-10 NOTE — Evaluation (Signed)
Clinical/Bedside Swallow Evaluation Patient Details  Name: [name] MRN: [id number] Date of Birth: 03/06/1926  Today's Date: 09/10/2019 Time: SLP Start Time (ACUTE ONLY): [id number] SLP Stop Time (ACUTE ONLY): 0844 SLP Time Calculation (min) (ACUTE ONLY): 32 min  Past Medical History:  Past Medical History:  Diagnosis Date  . Hypertension   . Hypothyroidism   . Presence of permanent cardiac pacemaker    Past Surgical History: History reviewed. No pertinent surgical history. HPI:  86 yo male adm to [facility] with weakness, dysarthria and fall with concern for acute CVA. Pt has h/o falling and hitting his head.  Pt CT scan head showed chronic lacunar CVAs x2 basal ganglia, small right cerebellar CVA.  Pt has h/o Bell's palsy 20 years prior, remote smoking/cigar use ceasing 40 years ago.  He failed a Audiological scientist and SLP was ordered. MRI pending/delayed due to pacemaker.   Assessment / Plan / Recommendation Clinical Impression  Pt presents with clinical indications of aspiration with thin liquid c/b coughing immediately post=swallow - suspect due to premature spillage posterior into pharynx/larynx.  No indication of pharyngeal retention with nectar, yogurt or peaches.  Oral retention due to trigiminal nerve involvment noted.  Pt has h/o Bell's Palsy with impact on upper/lower facial muscles on left. SLP Visit Diagnosis: Dysphagia, oropharyngeal phase (R13.12)    Aspiration Risk  Moderate aspiration risk    Diet Recommendation NPO        Other  Recommendations   tbd  Follow up Recommendations   MBS     Frequency and Duration            Prognosis    tbd    Swallow Study   General HPI: 86 yo male adm to [facility] with weakness, dysarthria and fall with concern for acute CVA. Pt has h/o falling and hitting his head.  Pt CT scan head showed chronic lacunar CVAs x2 basal ganglia, small right cerebellar CVA.  Pt has h/o Bell's palsy 20 years prior, remote smoking/cigar use ceasing 40 years ago.  He  failed a Audiological scientist and SLP was ordered. MRI pending/delayed due to pacemaker. Type of Study: Bedside Swallow Evaluation Diet Prior to this Study: NPO Temperature Spikes Noted: No Respiratory Status: Room air History of Recent Intubation: No Behavior/Cognition: Alert;Cooperative;Pleasant mood Oral Cavity Assessment: Within Functional Limits Oral Care Completed by SLP: No Oral Cavity - Dentition: Adequate natural dentition Vision: Functional for self-feeding Self-Feeding Abilities: Able to feed self Patient Positioning: Upright in bed Baseline Vocal Quality: Normal(pt admits weaker than normal) Volitional Cough: Strong Volitional Swallow: Able to elicit    Oral/Motor/Sensory Function Overall Oral Motor/Sensory Function: Mild impairment Facial ROM: Suspected CN VII (facial) dysfunction Facial Symmetry: Suspected CN VII (facial) dysfunction Facial Strength: Suspected CN VII (facial) dysfunction Facial Sensation: Suspected CN V (Trigeminal) dysfunction Lingual ROM: Suspected CN XII (hypoglossal) dysfunction Lingual Symmetry: Suspected CN XII (hypoglossal) dysfunction Lingual Strength: Suspected CN XII (hypoglossal) dysfunction Velum: (appeared slightly sluggish, ? slight deviation to left?????) Mandible: Within Functional Limits   Ice Chips Ice chips: Not tested   Thin Liquid Thin Liquid: Impaired Presentation: Cup;Self Fed;Straw Pharyngeal  Phase Impairments: Cough - Immediate Other Comments: suspect premature spillage into pharynx    Nectar Thick Nectar Thick Liquid: Impaired Presentation: Cup;Straw;Spoon Pharyngeal Phase Impairments: Cough - Delayed Other Comments: subtle delayed cough   Honey Thick     Puree Puree: Impaired Presentation: Self Fed Oral Phase Functional Implications: Other (comment)(labial retention on left, mild)   Solid     Solid:  Impaired Oral Phase Impairments: Reduced lingual movement/coordination Oral Phase Functional Implications: Left lateral sulci  pocketing      [name] 09/10/2019,9:16 AM  Kathleen Lime, MS [place] Office [phone]

## 2019-09-10 NOTE — Progress Notes (Signed)
Modified Barium Swallow Progress Note  Patient Details  Name: [name] MRN: [id number] Date of Birth: 1925/12/09  Today's Date: 09/10/2019  Modified Barium Swallow completed.  Full report located under Chart Review in the Imaging Section.  Brief recommendations include the following:  Clinical Impression  Patient presentn with moderate oropharyngeal dysphagia with sensorimotor difficulties result in discoordination with premature spillage of boluses into pharynx/larynx before swallow triggered.  Pooling to pyriform sinus noted up to 6 seconds with thin liquids.  Chin tuck posture allowed SILENT aspiration of thin and nectar consistencies - and worsened airway protection.  Control of bolus strictly to small single cup sip prevented aspiration of nectar.  Pt did have very delayed cough after aspirates - large amount of aspiration but did not clear aspirates.  Head turn left did not prevent aspiration nor decrease residuals.  Pt with mild vallecular retention with liquids and puree- Cues to "hock" helpful to decrease x1.  There were also 2 occasions where pt thought he had swallowed and he had not - initiation difficulties noted. Recommend pt have dys3/ground meat/nectar diet and allow thin water between meals after mouth care for hydration/QOL.  Using teach back, educated pt to findings/recommendations with live monitor.  Will follow up for dysphagia management.      Swallow Evaluation Recommendations       SLP Diet Recommendations: Dysphagia 3 (Mech soft) solids;Nectar thick liquid   Liquid Administration via: Spoon;Cup;No straw   Medication Administration: Whole meds with puree   Supervision: Full supervision/cueing for compensatory strategies(initially)   Compensations: Slow rate;Small sips/bites;Effortful swallow;Other (Comment)   Postural Changes: Remain semi-upright after after feeds/meals (Comment);Seated upright at 90 degrees   Oral Care Recommendations: Oral care QID   Other Recommendations: Order thickener from pharmacy;Have oral suction available  Kathleen Lime, MS [facility] SLP Acute Rehab Services Office [phone]   [name] 09/10/2019,10:43 AM

## 2019-09-10 NOTE — Progress Notes (Signed)
Occupational Therapy Evaluation Patient Details Name: [name] MRN: [id number] DOB: 1925-06-12 Today's Date: 09/10/2019    History of Present Illness 86 y.o. male with medical history significant for polymyalgia rheumatica, CKD, cardiomyopathy, hypertension, hypothyroidism, chronic systolic CHF, atrial fibrillation on warfarin, traumatic Rt frontal ICH in January 2021, and sick sinus syndrome with pacemaker, presented 09/09/19 to the emergency department with left-sided weakness, slurred speech, and ambulatory difficulty with falls. Initial head CT scan head showed chronic lacunar CVAs x2 basal ganglia, chronic small right cerebellar CVA and no acute changes and pt discharged home, where he again fell and returned to ED (same day). MRI pending due to pacemaker   Clinical Impression   PTA pt lived with his wife, independent in all ADL and mobility tasks. Pt does not ambulate with an assistive device and reports 4 falls in the last 6 months. Pt still drives. Pt currently requires setup to max assist x 2 for self-care and functional transfer tasks. Pt engaged in LB dressing and grooming tasks while seated in bedside chair. Pt unable to maintain upright sitting balance without support, continually falling back into chair. Pt required mod encouragement and cues to incorporate use of LUE into all tasks. Pt completed sit/stand transfer with max assist and cues for hand placement. Pt tolerated standing ~1 min with variable mod to max assist, noting left lateral and retro lean unable to self-correct. Pt extremely fatigued this date having difficulty following one step instructions and requiring increased time. Pt demonstrates decreased ROM, FMC, GMC, strength, endurance, sitting/standing balance, standing tolerance, and activity tolerance. Recommend skilled OT services to address above deficits in order to promote function and prevent further decline. Recommend SNF placement for additional rehab prior to  discharge home.     Follow Up Recommendations  SNF;Supervision/Assistance - 24 hour    Equipment Recommendations  Other (comment)(TBD at next venue of care)    Recommendations for Other Services       Precautions / Restrictions Precautions Precautions: Fall Precaution Comments: reports at least 3 falls in 6 months Restrictions Weight Bearing Restrictions: No      Mobility Bed Mobility Overal bed mobility: Needs Assistance Bed Mobility: Supine to Sit     Supine to sit: Max assist     General bed mobility comments: Pt seated in bedside chair upon OT arrival  Transfers Overall transfer level: Needs assistance Equipment used: Rolling walker (2 wheeled) Transfers: Sit to/from Stand Sit to Stand: Max assist         General transfer comment: Cues for hand placement and body positioning. Assist to power up into standing. Unable to place LUE on RW. Retro and left lateral lean upon standing.     Balance Overall balance assessment: Needs assistance;History of Falls Sitting-balance support: Bilateral upper extremity supported;Feet supported Sitting balance-Leahy Scale: Poor Sitting balance - Comments: Pt seated in bedside chair. Unable to sit forward and hold position, falling back into chair.  Postural control: Posterior lean;Left lateral lean Standing balance support: Bilateral upper extremity supported Standing balance-Leahy Scale: Poor Standing balance comment: wide BOS; forward flexed; bil UE support on RW                           ADL either performed or assessed with clinical judgement   ADL Overall ADL's : Needs assistance/impaired Eating/Feeding: Set up;Sitting   Grooming: Wash/dry hands;Wash/dry face;Brushing hair;Sitting;Moderate assistance Grooming Details (indicate cue type and reason): While seated in bedside chair. Primarily used RUE.  Encouraged pt to incorporate LUE into task with fair follow through. Provided hand over hand assist for LUE to  comb hair.   Upper Body Bathing: Moderate assistance;Sitting   Lower Body Bathing: Maximal assistance;Sitting/lateral leans;Sit to/from stand   Upper Body Dressing : Moderate assistance;Sitting   Lower Body Dressing: Maximal assistance;Sitting/lateral leans Lower Body Dressing Details (indicate cue type and reason): Pt utilized figure four position to don/doff socks while seated. Able to doff socks with min assist and use of RUE only. Pt able to don LLE sock once started over foot. Max assist to don RLE sock.  Toilet Transfer: Moderate assistance;Maximal assistance;+2 for physical assistance;BSC   Toileting- Clothing Manipulation and Hygiene: Maximal assistance;+2 for physical assistance;Sit to/from stand       Functional mobility during ADLs: Moderate assistance;Maximal assistance;+2 for physical assistance;Rolling walker General ADL Comments: Pt able to stand ~1 min and march in place requiring variable mod to max assist. Pt requested to sit due to fatigue.      Vision Baseline Vision/History: Wears glasses Wears Glasses: At all times Vision Assessment?: Yes;Vision impaired- to be further tested in functional context Additional Comments: Attempted visual assessment, however difficulty following test instructions. Pt reports mod fatigue from busy morning. Recommend further assessing vision next session.      Perception     Praxis      Pertinent Vitals/Pain Pain Assessment: No/denies pain     Hand Dominance Right   Extremity/Trunk Assessment Upper Extremity Assessment Upper Extremity Assessment: Generalized weakness;LUE deficits/detail(RUE ROM WFL) LUE Deficits / Details: LUE active shld flex ~90 degrees. Supination and finger ext impaired. 3-/5.  LUE Sensation: WNL LUE Coordination: decreased fine motor;decreased gross motor   Lower Extremity Assessment Lower Extremity Assessment: Defer to PT evaluation LLE Deficits / Details: AROM WNL with noted dyscoordination; knee  extension 4/5, ankle DF 4/5 LLE Sensation: (denies changes) LLE Coordination: decreased gross motor(leg adducts, ataxic with seated LAQ)   Cervical / Trunk Assessment Cervical / Trunk Assessment: Other exceptions Cervical / Trunk Exceptions: strong left lean in sitting; pt aware but could not correct   Communication Communication Communication: Other (comment)(slurred speech)   Cognition Arousal/Alertness: Lethargic Behavior During Therapy: WFL for tasks assessed/performed Overall Cognitive Status: No family/caregiver present to determine baseline cognitive functioning                                 General Comments: A&O x 4. Pt pleasant and willing to participate in therapy tasks. Difficulty following one step instructions, requiring increased time.    General Comments       Exercises     Shoulder Instructions      Home Living Family/patient expects to be discharged to:: Private residence Living Arrangements: Spouse/significant other Available Help at Discharge: Family;Available 24 hours/day Type of Home: Independent living facility Home Access: Other (comment)(curb from parking lot)     Home Layout: One level     Bathroom Shower/Tub: Occupational psychologist: Handicapped height Bathroom Accessibility: Yes   Home Equipment: Grab bars - tub/shower;Grab bars - toilet;Walker - 2 wheels;Shower seat - built in   Additional Comments: pt reports given RW in ED 09/09/19 (did not use a device after Jan '21 ICH)      Prior Functioning/Environment Level of Independence: Independent  Gait / Transfers Assistance Needed: Pt reports ambulating independently around home without an AD. Pt reports 4 falls in the last 6 months.  ADL's /  Homemaking Assistance Needed: Pt independent in all ADLs. Pt reports that wife completes IADLs. Pt still drives. Communication / Swallowing Assistance Needed: difficult to understand at times          OT Problem List:  Decreased strength;Decreased range of motion;Decreased activity tolerance;Impaired balance (sitting and/or standing);Impaired vision/perception;Decreased cognition;Decreased coordination;Impaired UE functional use      OT Treatment/Interventions: Self-care/ADL training;Therapeutic exercise;Neuromuscular education;Energy conservation;DME and/or AE instruction;Therapeutic activities;Visual/perceptual remediation/compensation;Patient/family education;Cognitive remediation/compensation;Balance training    OT Goals(Current goals can be found in the care plan section) Acute Rehab OT Goals Patient Stated Goal: to get stronger Time For Goal Achievement: 09/24/19 Potential to Achieve Goals: Good ADL Goals Pt Will Perform Grooming: sitting;with min assist Pt Will Perform Upper Body Bathing: with min assist;sitting Pt Will Perform Lower Body Dressing: with mod assist;sit to/from stand Pt Will Transfer to Toilet: with min assist;with +2 assist;bedside commode Pt Will Perform Toileting - Clothing Manipulation and hygiene: with mod assist;with 2+ total assist Pt/caregiver will Perform Home Exercise Program: Increased ROM;Left upper extremity;With Supervision;With written HEP provided Additional ADL Goal #1: Pt to incorporate use of LUE into all tasks with min cues. Additional ADL Goal #2: Pt to tolerate standing up to 5 min with min assist, in preparation for ADLs.  OT Frequency: Min 2X/week   Barriers to D/C:            Co-evaluation              AM-PAC OT "6 Clicks" Daily Activity     Outcome Measure Help from another person eating meals?: A Little Help from another person taking care of personal grooming?: A Lot Help from another person toileting, which includes using toliet, bedpan, or urinal?: A Lot Help from another person bathing (including washing, rinsing, drying)?: A Lot Help from another person to put on and taking off regular upper body clothing?: A Lot Help from another person  to put on and taking off regular lower body clothing?: A Lot 6 Click Score: 13   End of Session Equipment Utilized During Treatment: Gait belt;Rolling walker Nurse Communication: Mobility status  Activity Tolerance: Patient limited by fatigue;Patient limited by lethargy Patient left: in chair;with call bell/phone within reach;with chair alarm set  OT Visit Diagnosis: Unsteadiness on feet (R26.81);Muscle weakness (generalized) (M62.81);History of falling (Z91.81);Hemiplegia and hemiparesis Hemiplegia - Right/Left: Left Hemiplegia - dominant/non-dominant: Non-Dominant Hemiplegia - caused by: Unspecified                Time: [id number] OT Time Calculation (min): 29 min Charges:  OT General Charges $OT Visit: 1 Visit OT Evaluation $OT Eval Moderate Complexity: 1 Mod OT Treatments $Self Care/Home Management : 8-22 mins  Mauri Brooklyn OTR/L [phone]  Mauri Brooklyn 09/10/2019, 12:31 PM

## 2019-09-10 NOTE — TOC Initial Note (Signed)
Transition of Care [facility]) - Initial/Assessment Note    Patient Details  Name: AMJED STRIKE MRN: [id number] Date of Birth: [birth year]  Transition of Care [facility]) CM/SW Contact:    Pollie Friar, RN Phone Number: 09/10/2019, 3:02 PM  Clinical Narrative:                 Pt has recently moved to [facility] with his spouse. He has a walker, elevated toilet and [place].  He has been driving up until this point and takes his own medications without issues. PT recommending SNF for rehab. After speaking with patient CM has completed FL2 and sent information to SN[facility] of [place]. Pts last covid was negative 5/9.  CM attempted to reach pts wife (with permission) without success.  TOC following.  Expected Discharge Plan: Skilled Nursing Facility Barriers to Discharge: Continued Medical Work up   Patient Goals and CMS Choice   CMS Medicare.gov Compare Post Acute Care list provided to:: Patient Choice offered to / list presented to : Patient  Expected Discharge Plan and Services Expected Discharge Plan: [place] In-house Referral: Clinical Social Work Discharge Planning Services: CM Consult Post Acute Care Choice: [place] Living arrangements for the past 2 months: Apartment                                      Prior Living Arrangements/Services Living arrangements for the past 2 months: Apartment Lives with:: Spouse Patient language and need for interpreter reviewed:: Yes Do you feel safe going back to the place where you live?: Yes      Need for Family Participation in Patient Care: Yes (Comment) Care giver support system in place?: No (comment)   Criminal Activity/Legal Involvement Pertinent to Current Situation/Hospitalization: No - Comment as needed  Activities of Daily Living      Permission Sought/Granted                  Emotional Assessment Appearance:: Appears stated age Attitude/Demeanor/Rapport:  Engaged Affect (typically observed): Accepting Orientation: : Oriented to Self, Oriented to Place, Oriented to  Time, Oriented to Situation   Psych Involvement: No (comment)  Admission diagnosis:  Acute left-sided weakness [R53.1] Cerebrovascular accident (CVA), unspecified mechanism ([place]) [I63.9] CVA (cerebral vascular accident) [facility]) [I63.9] Patient Active Problem List   Diagnosis Date Noted  . Cerebral embolism with cerebral infarction 09/10/2019  . CVA (cerebral vascular accident) ([place]) 09/10/2019  . Acute left-sided weakness 09/09/2019  . AF (paroxysmal atrial fibrillation) ([place]) 09/09/2019  . Hypothyroidism 09/09/2019  . Essential hypertension 09/09/2019  . Chronic systolic CHF (congestive heart failure) ([place]) 09/09/2019  . SSS (sick sinus syndrome) ([place]) 09/09/2019   PCP:  [name], MD Pharmacy:   [facility] [address], [place] - [address] [place] [address] Phone: [phone] Fax: [phone]  [place], [place] - [address] [place] [address] Phone: [phone] Fax: [phone]     Social Determinants of Health (SDOH) Interventions    Readmission Risk Interventions No flowsheet data found.

## 2019-09-10 NOTE — Progress Notes (Signed)
EEG complete - results pending 

## 2019-09-10 NOTE — Evaluation (Signed)
Physical Therapy Evaluation Patient Details Name: [name] MRN: [id number] DOB: 02-15-1926 Today's Date: 09/10/2019   History of Present Illness  86 y.o. male with medical history significant for polymyalgia rheumatica, CKD, cardiomyopathy, hypertension, hypothyroidism, chronic systolic CHF, atrial fibrillation on warfarin, traumatic Rt frontal ICH in January 2021, and sick sinus syndrome with pacemaker, presented 09/09/19 to the emergency department with left-sided weakness, slurred speech, and ambulatory difficulty with falls. Initial head CT scan head showed chronic lacunar CVAs x2 basal ganglia, chronic small right cerebellar CVA and no acute changes and pt discharged home, where he again fell and returned to ED (same day). MRI pending due to pacemaker  Clinical Impression   Pt admitted with above diagnosis. Patient with left sided weakness (MRI pending) with impaired balance and high risk for falling. Required max assist to sit at EOB and mod assist to stand with RW.  Pt currently with functional limitations due to the deficits listed below (see PT Problem List). Pt will benefit from skilled PT to increase their independence and safety with mobility to allow discharge to the venue listed below.       Follow Up Recommendations SNF;Supervision/Assistance - 24 hour(at [facility])    Equipment Recommendations  None recommended by PT    Recommendations for Other Services OT consult;Speech consult     Precautions / Restrictions Precautions Precautions: Fall Precaution Comments: reports at least 3 falls in 6 months      Mobility  Bed Mobility Overal bed mobility: Needs Assistance Bed Mobility: Supine to Sit     Supine to sit: Max assist     General bed mobility comments: exit bed to left with pt attempting to push up from supine to sit; required max assist to raise torso and pivot/scoot to get feet on the floor  Transfers Overall transfer level: Needs  assistance Equipment used: Rolling walker (2 wheeled) Transfers: Sit to/from Stand Sit to Stand: Mod assist         General transfer comment: Lt hand placed on RW, rt side of RW anchored by PT; assist anterior wt-shift and hip/knee extension to achieve upright  Ambulation/Gait Ambulation/Gait assistance: Mod assist Gait Distance (Feet): 3 Feet Assistive device: Rolling walker (2 wheeled) Gait Pattern/deviations: Step-to pattern;Decreased step length - left;Decreased weight shift to left;Shuffle;Trunk flexed;Wide base of support     General Gait Details: pivotal steps to his left; advancing LLE without assist however short step length, decr coordination  Stairs            Wheelchair Mobility    Modified Rankin (Stroke Patients Only) Modified Rankin (Stroke Patients Only) Pre-Morbid Rankin Score: Slight disability Modified Rankin: Moderately severe disability     Balance Overall balance assessment: Needs assistance;History of Falls Sitting-balance support: Bilateral upper extremity supported;Feet supported Sitting balance-Leahy Scale: Zero Sitting balance - Comments: easily distracted and loses balance to his left and/or posteriorly Postural control: Posterior lean;Left lateral lean Standing balance support: Bilateral upper extremity supported Standing balance-Leahy Scale: Poor Standing balance comment: wide BOS; forward flexed; bil UE support on RW                             Pertinent Vitals/Pain Pain Assessment: No/denies pain    Home Living Family/patient expects to be discharged to:: Private residence Living Arrangements: Spouse/significant other Available Help at Discharge: Family;Available 24 hours/day Type of Home: Independent living facility Home Access: Other (comment)(curb from parking lot)     Home  Layout: One level Home Equipment: Grab bars - tub/shower;Grab bars - toilet;Walker - 2 wheels;Shower seat - built in Additional Comments: pt  reports given RW in ED 09/09/19 (did not use a device after Jan '21 ICH)    Prior Function Level of Independence: Needs assistance   Gait / Transfers Assistance Needed: ~3 falls in 6 months (all in the last few weeks(; after Jan 2021 [place] had recovered to independent walking (never used a device)  ADL's / Homemaking Assistance Needed: independent with bathing and dressing (not using seat)        Hand Dominance   Dominant Hand: Right    Extremity/Trunk Assessment   Upper Extremity Assessment Upper Extremity Assessment: Defer to OT evaluation(LUE weakness noted, able to grip handle of RW)    Lower Extremity Assessment Lower Extremity Assessment: LLE deficits/detail LLE Deficits / Details: AROM WNL with noted dyscoordination; knee extension 4/5, ankle DF 4/5 LLE Sensation: (denies changes) LLE Coordination: decreased gross motor(leg adducts, ataxic with seated LAQ)    Cervical / Trunk Assessment Cervical / Trunk Assessment: Other exceptions Cervical / Trunk Exceptions: strong left lean in sitting; pt aware but could not correct  Communication   Communication: Expressive difficulties  Cognition Arousal/Alertness: Awake/alert Behavior During Therapy: WFL for tasks assessed/performed Overall Cognitive Status: Within Functional Limits for tasks assessed                                 General Comments: a&ox4; aware he should not get up alone and how to call for assist      General Comments      Exercises     Assessment/Plan    PT Assessment Patient needs continued PT services  PT Problem List Decreased strength;Decreased balance;Decreased mobility;Decreased coordination;Decreased knowledge of use of DME       PT Treatment Interventions DME instruction;Gait training;Functional mobility training;Therapeutic activities;Balance training;Neuromuscular re-education;Patient/family education    PT Goals (Current goals can be found in the Care Plan section)  Acute  Rehab PT Goals Patient Stated Goal: get stronger; stop falling PT Goal Formulation: With patient Time For Goal Achievement: 09/24/19 Potential to Achieve Goals: Good    Frequency Min 3X/week   Barriers to discharge        Co-evaluation               AM-PAC PT "6 Clicks" Mobility  Outcome Measure Help needed turning from your back to your side while in a flat bed without using bedrails?: A Lot Help needed moving from lying on your back to sitting on the side of a flat bed without using bedrails?: Total Help needed moving to and from a bed to a chair (including a wheelchair)?: A Lot Help needed standing up from a chair using your arms (e.g., wheelchair or bedside chair)?: A Lot Help needed to walk in hospital room?: A Lot Help needed climbing 3-5 steps with a railing? : Total 6 Click Score: 10    End of Session Equipment Utilized During Treatment: Gait belt Activity Tolerance: Patient tolerated treatment well Patient left: in chair;with call bell/phone within reach;with chair alarm set;with nursing/sitter in room Nurse Communication: Mobility status;Other (comment)(bed wet (male purewick not hooked to suction)) PT Visit Diagnosis: Hemiplegia and hemiparesis;Difficulty in walking, not elsewhere classified (R26.2);History of falling (Z91.81) Hemiplegia - Right/Left: Left Hemiplegia - dominant/non-dominant: Non-dominant Hemiplegia - caused by: Unspecified    Time: 1004-1036 PT Time Calculation (min) (ACUTE ONLY): 32 min  Charges:   PT Evaluation $PT Eval Low Complexity: 1 Low PT Treatments $Therapeutic Activity: 8-22 mins         Arby Barrette, PT Pager [phone]   Rexanne Mano 09/10/2019, 11:03 AM

## 2019-09-10 NOTE — NC FL2 (Signed)
[place] LEVEL OF CARE SCREENING TOOL     IDENTIFICATION  Patient Name: [name] Birthdate: 12/17/1925 Sex: male Admission Date (Current Location): 09/09/2019  [facility] and [place] Number:  Herbalist and Address:  [facility], [place] [address] [place],  [address]      Provider Number: [id number]  Attending Physician Name and Address:  Lavina Hamman, MD  Relative Name and Phone Number:       Current Level of Care: Hospital Recommended Level of Care: [place] Prior Approval Number:    Date Approved/Denied:   PASRR Number: [id number]  Discharge Plan: SNF    Current Diagnoses: Patient Active Problem List   Diagnosis Date Noted  . Cerebral embolism with cerebral infarction 09/10/2019  . CVA (cerebral vascular accident) ([place]) 09/10/2019  . Acute left-sided weakness 09/09/2019  . AF (paroxysmal atrial fibrillation) ([place]) 09/09/2019  . Hypothyroidism 09/09/2019  . Essential hypertension 09/09/2019  . Chronic systolic CHF (congestive heart failure) ([place]) 09/09/2019  . SSS (sick sinus syndrome) ([place]) 09/09/2019    Orientation RESPIRATION BLADDER Height & Weight     Self, Time, Situation, Place  Normal Incontinent Weight: 68 kg Height:  5\' 9"  (175.3 cm)  BEHAVIORAL SYMPTOMS/MOOD NEUROLOGICAL BOWEL NUTRITION STATUS      Continent Diet(dysphagia 3 with nectar thick liquids)  AMBULATORY STATUS COMMUNICATION OF NEEDS Skin   Extensive Assist Verbally Normal                       Personal Care Assistance Level of Assistance  Bathing, Feeding, Dressing Bathing Assistance: Maximum assistance Feeding assistance: Limited assistance Dressing Assistance: Maximum assistance     Functional Limitations Info  Sight, Hearing, Speech Sight Info: Impaired Hearing Info: Impaired Speech Info: Impaired(slur/ dysarthria)    SPECIAL CARE FACTORS FREQUENCY  PT (By licensed PT), OT (By licensed OT),  Speech therapy     PT Frequency: 5x/wk OT Frequency: 5x/wk     Speech Therapy Frequency: 5x/wk      Contractures Contractures Info: Not present    Additional Factors Info  Code Status, Allergies Code Status Info: Full Allergies Info: NKA           Current Medications (09/10/2019):  This is the current hospital active medication list Current Facility-Administered Medications  Medication Dose Route Frequency Provider Last Rate Last Admin  .  stroke: mapping our early stages of recovery book   Does not apply Once Opyd, Ilene Qua, MD   Stopped at 09/09/19 2356  . 0.9 %  sodium chloride infusion  250 mL Intravenous PRN Opyd, Ilene Qua, MD      . acetaminophen (TYLENOL) tablet 650 mg  650 mg Oral Q4H PRN Opyd, Ilene Qua, MD       Or  . acetaminophen (TYLENOL) 160 MG/5ML solution 650 mg  650 mg Per Tube Q4H PRN Opyd, Ilene Qua, MD       Or  . acetaminophen (TYLENOL) suppository 650 mg  650 mg Rectal Q4H PRN Opyd, Ilene Qua, MD      . Derrill Memo ON 09/11/2019] atorvastatin (LIPITOR) tablet 20 mg  20 mg Oral Daily Biby, Sharon L, NP      . labetalol (NORMODYNE) injection 10 mg  10 mg Intravenous Q2H PRN Opyd, Ilene Qua, MD      . Resource ThickenUp Clear   Oral PRN Lavina Hamman, MD      . senna-docusate (Senokot-S) tablet 1 tablet  1 tablet Oral QHS PRN Opyd, Ilene Qua, MD      . sodium chloride flush (NS) 0.9 % injection 3 mL  3 mL Intravenous Q12H Opyd, Ilene Qua, MD   3 mL at 09/10/19 1038  . sodium chloride flush (NS) 0.9 % injection 3 mL  3 mL Intravenous PRN Opyd, Ilene Qua, MD      . Warfarin - Pharmacist Dosing Inpatient   Does not apply q1600 Einar Grad, [facility]         Discharge Medications: Please see discharge summary for a list of discharge medications.  Relevant Imaging Results:  Relevant Lab Results:   Additional Information SS#: [id number]  Pollie Friar, RN

## 2019-09-10 NOTE — CV Procedure (Signed)
2D echo attempted, Patient still in MRI

## 2019-09-11 ENCOUNTER — Inpatient Hospital Stay (HOSPITAL_COMMUNITY): Payer: Medicare Other

## 2019-09-11 DIAGNOSIS — I6389 Other cerebral infarction: Secondary | ICD-10-CM

## 2019-09-11 LAB — CBC
HCT: 40.7 % (ref 39.0–52.0)
Hemoglobin: 13.3 g/dL (ref 13.0–17.0)
MCH: 30.4 pg (ref 26.0–34.0)
MCHC: 32.7 g/dL (ref 30.0–36.0)
MCV: 92.9 fL (ref 80.0–100.0)
Platelets: 135 10*3/uL — ABNORMAL LOW (ref 150–400)
RBC: 4.38 MIL/uL (ref 4.22–5.81)
RDW: 13.3 % (ref 11.5–15.5)
WBC: 7.1 10*3/uL (ref 4.0–10.5)
nRBC: 0 % (ref 0.0–0.2)

## 2019-09-11 LAB — BASIC METABOLIC PANEL
Anion gap: 9 (ref 5–15)
BUN: 20 mg/dL (ref 8–23)
CO2: 27 mmol/L (ref 22–32)
Calcium: 9 mg/dL (ref 8.9–10.3)
Chloride: 103 mmol/L (ref 98–111)
Creatinine, Ser: 1.13 mg/dL (ref 0.61–1.24)
GFR calc Af Amer: >60 mL/min (ref >60)
GFR calc non Af Amer: 57 mL/min — ABNORMAL LOW (ref >60)
Glucose, Bld: 113 mg/dL — ABNORMAL HIGH (ref 70–99)
Potassium: 3.6 mmol/L (ref 3.5–5.1)
Sodium: 139 mmol/L (ref 135–145)

## 2019-09-11 LAB — PROTIME-INR
INR: 2.1 — ABNORMAL HIGH (ref 0.8–1.2)
Prothrombin Time: 23.1 seconds — ABNORMAL HIGH (ref 11.4–15.2)

## 2019-09-11 LAB — ECHOCARDIOGRAM COMPLETE
Height: 69 in
Weight: 2400 oz

## 2019-09-11 MED ORDER — LEVOTHYROXINE SODIUM 50 MCG PO TABS
50.0000 ug | ORAL_TABLET | Freq: Every day | ORAL | Status: DC
  Administered 2019-09-11 – 2019-09-14 (×4): 50 ug via ORAL
  Filled 2019-09-11 (×4): qty 1

## 2019-09-11 NOTE — Progress Notes (Signed)
STROKE TEAM PROGRESS NOTE   INTERVAL HISTORY His son is at the bedside.  Echocardiogram done this morning shows ejection fraction 60 to 65% with no cardiac source of embolism.  He states his speech has improved though he still has left-sided weakness which has persisted.  Vital signs are stable.  No neurological changes. Vitals:   09/10/19 2349 09/11/19 0344 09/11/19 1015 09/11/19 1225  BP: (!) 148/65 (!) 159/76 (!) 142/73 (!) 153/73  Pulse: 66 69 61 60  Resp: 17 17 18 19   Temp: (!) 97.2 F (36.2 C) 97.9 F (36.6 C) 98.2 F (36.8 C) 98 F (36.7 C)  TempSrc: Oral Oral Oral Oral  SpO2: 97% 98% 94% 96%  Weight:      Height:        CBC:  Recent Labs  Lab 09/10/19 0621 09/11/19 0317  WBC 6.2 7.1  HGB 12.8* 13.3  HCT 38.9* 40.7  MCV 92.6 92.9  PLT 127* 135*    Basic Metabolic Panel:  Recent Labs  Lab 09/10/19 0621 09/11/19 0317  NA 138 139  K 4.3 3.6  CL 106 103  CO2 22 27  GLUCOSE 99 113*  BUN 15 20  CREATININE 0.81 1.13  CALCIUM 9.0 9.0   Lipid Panel:     Component Value Date/Time   CHOL 133 09/10/2019 0621   TRIG 73 09/10/2019 0621   HDL 41 09/10/2019 0621   CHOLHDL 3.2 09/10/2019 0621   VLDL 15 09/10/2019 0621   LDLCALC 77 09/10/2019 0621   HgbA1c:  Lab Results  Component Value Date   HGBA1C 5.5 09/10/2019   Urine Drug Screen: No results found for: LABOPIA, COCAINSCRNUR, LABBENZ, AMPHETMU, THCU, LABBARB  Alcohol Level No results found for: ETH  IMAGING past 24 hours MR BRAIN WO CONTRAST  Result Date: 09/10/2019 CLINICAL DATA:  Generalized weakness. Mental status changes. Negative CT evaluation. EXAM: MRI HEAD WITHOUT CONTRAST TECHNIQUE: Multiplanar, multiecho pulse sequences of the brain and surrounding structures were obtained without intravenous contrast. COMPARISON:  CT angiography 09/09/2019. FINDINGS: Brain: Diffusion imaging shows a small region of acute infarction affecting the right deep insular cortex in the underlying radiating white matter  tracts. No evidence of hemorrhage or mass effect. No other acute infarction. Elsewhere, there are old small vessel ischemic changes of the pons. Old small vessel cerebellar infarction on the right. Moderate changes of chronic small vessel disease of the cerebral hemispheric white matter. No mass lesion, recent hemorrhage, hydrocephalus or extra-axial collection. Small focus of hemosiderin deposition in the right frontal white matter related to an old insult. Vascular: Flow is present within the major vessels at the base of the brain, with the exception of the left vertebral artery which shows abnormal flow, better demonstrated at CT angiography. Skull and upper cervical spine: Negative Sinuses/Orbits: Clear/normal Other: None IMPRESSION: Acute infarction in the right deep insula and radiating white matter tracts. No mass effect or hemorrhage. Atrophy and chronic small-vessel ischemic changes affecting the brain elsewhere as described. Old right frontal white matter hemorrhage. Abnormal flow in the distal left vertebral artery as shown by CT angiography, presumably unrelated to the acute insult. Electronically Signed   By: Nelson Chimes M.D.   On: 09/10/2019 14:17   EEG adult  Result Date: 09/11/2019 Lora Havens, MD     09/11/2019 10:23 AM Patient Name: [name] MRN: [id number] Epilepsy Attending: Lora Havens Referring Physician/Provider: Dr. Mitzi Hansen Date: 09/10/2019 Duration: 27.08 mins Patient history: 86 year old male who presented with sudden onset  of slurred speech left-sided weakness and difficulty walking.  EEG to evaluate for seizures. Level of alertness: Awake, asleep AEDs during EEG study: None Technical aspects: This EEG study was done with scalp electrodes positioned according to the 10-20 International system of electrode placement. Electrical activity was acquired at a sampling rate of 500Hz  and reviewed with a high frequency filter of 70Hz  and a low frequency filter of 1Hz . EEG  data were recorded continuously and digitally stored. Description: During awake state, no clear posterior dominant was seen. Sleep was characterized by vertex waves and intermittent generalized 2 to 3 Hz delta slowing.  EEG also showed generalized polymorphic mixed frequencies with predominantly 8-13 Hz alpha-beta activity. Hyperventilation and photic stimulation were not performed. IMPRESSION: This study is within normal limits. No seizures or epileptiform discharges were seen throughout the recording. Lora Havens   ECHOCARDIOGRAM COMPLETE  Result Date: 09/11/2019    ECHOCARDIOGRAM REPORT   Patient Name:   [name] Date of Exam: 09/11/2019 Medical Rec #:  [id number]      Height:       69.0 in Accession #:    [id number]     Weight:       150.0 lb Date of Birth:  11-Aug-1925      BSA:          1.828 m Patient Age:    86 years       BP:           159/76 mmHg Patient Gender: M              HR:           68 bpm. Exam Location:  Inpatient Procedure: 2D Echo, Cardiac Doppler and Color Doppler Indications:    Stroke 434.91 / I163.9  History:        Patient has no prior history of Echocardiogram examinations.  Sonographer:    Jonelle Sidle Dance Referring Phys: [id number] [place]  1. Left ventricular ejection fraction, by estimation, is 60 to 65%. The left ventricle has normal function. The left ventricle has no regional wall motion abnormalities. Normal left ventricular annular diastolic velocities are consistent with normal left ventricular relaxation properties and normal left atrial pressure. Diminutive atrial contraction-related velocities are suggestive of recent atrial fibrillation with stunning or left atrial mechanical failure.  2. Right ventricular systolic function is normal. The right ventricular size is normal. Tricuspid regurgitation signal is inadequate for assessing PA pressure.  3. Left atrial size was mildly dilated.  4. The mitral valve is normal in structure. Trivial mitral valve  regurgitation.  5. The aortic valve is normal in structure. Aortic valve regurgitation is trivial.  6. Aortic dilatation noted. There is mild dilatation of the ascending aorta measuring 40 mm.  7. The inferior vena cava is normal in size with greater than 50% respiratory variability, suggesting right atrial pressure of 3 mmHg. FINDINGS  Left Ventricle: Left ventricular ejection fraction, by estimation, is 60 to 65%. The left ventricle has normal function. The left ventricle has no regional wall motion abnormalities. The left ventricular internal cavity size was normal in size. There is  no left ventricular hypertrophy. Normal left ventricular annular diastolic velocities are consistent with normal left ventricular relaxation properties and normal left atrial pressure. Diminutive atrial contraction-related velocities are suggestive of recent atrial fibrillation with stunning or left atrial mechanical failure. Right Ventricle: The right ventricular size is normal. No increase in right ventricular wall thickness. Right ventricular systolic function is  normal. Tricuspid regurgitation signal is inadequate for assessing PA pressure. Left Atrium: Left atrial size was mildly dilated. Right Atrium: Right atrial size was normal in size. Pericardium: A small pericardial effusion is present. The pericardial effusion is circumferential. There is no evidence of cardiac tamponade. Mitral Valve: The mitral valve is normal in structure. Trivial mitral valve regurgitation. Tricuspid Valve: The tricuspid valve is normal in structure. Tricuspid valve regurgitation is not demonstrated. Aortic Valve: The aortic valve is normal in structure. Aortic valve regurgitation is trivial. Pulmonic Valve: The pulmonic valve was not well visualized. Pulmonic valve regurgitation is not visualized. Aorta: The aortic root is normal in size and structure and aortic dilatation noted. There is mild dilatation of the ascending aorta measuring 40 mm.  Venous: The inferior vena cava is normal in size with greater than 50% respiratory variability, suggesting right atrial pressure of 3 mmHg. IAS/Shunts: No atrial level shunt detected by color flow Doppler. Additional Comments: A pacer wire is visualized.  LEFT VENTRICLE PLAX 2D LVIDd:         5.49 cm LV PW:         1.22 cm LV IVS:        1.01 cm LVOT diam:     2.10 cm LV SV:         56 LV SV Index:   31 LVOT Area:     3.46 cm  RIGHT VENTRICLE             IVC RV Basal diam:  2.33 cm     IVC diam: 1.54 cm RV S prime:     12.40 cm/s TAPSE (M-mode): 2.1 cm LEFT ATRIUM             Index       RIGHT ATRIUM           Index LA diam:        4.30 cm 2.35 cm/m  RA Area:     14.10 cm LA Vol (A2C):   65.8 ml 35.99 ml/m RA Volume:   31.30 ml  17.12 ml/m LA Vol (A4C):   55.3 ml 30.25 ml/m LA Biplane Vol: 61.8 ml 33.80 ml/m  AORTIC VALVE LVOT Vmax:   78.00 cm/s LVOT Vmean:  50.950 cm/s LVOT VTI:    0.161 m  AORTA Ao Root diam: 4.00 cm Ao Asc diam:  4.00 cm MITRAL VALVE MV Area (PHT): 1.78 cm    SHUNTS MV Decel Time: 426 msec    Systemic VTI:  0.16 m MV E velocity: 62.10 cm/s  Systemic Diam: 2.10 cm Dani Gobble Croitoru MD Electronically signed by Sanda Klein MD Signature Date/Time: 09/11/2019/10:11:23 AM    Final     PHYSICAL EXAM Pleasant elderly Caucasian male not in distress. . Afebrile. Head is nontraumatic. Neck is supple without bruit.    Cardiac exam no murmur or gallop. Lungs are clear to auscultation. Distal pulses are well felt. Neurological Exam ;Awake alert oriented x 3 normal mild dysarthria.. Mild left lower face asymmetry. Tongue midline.  Mild left upper and lower extremity drift. Mild diminished fine finger movements on left. Orbits right over left upper extremity. Mild left grip weak.. Normal sensation . Normal coordination. Gait not tested ASSESSMENT/PLAN [name] is a 86 y.o. male with history of hypertension, chronic systolic heart failure, polymyalgia rheumatica, paroxysmal atrial  fibrillation, CKD, nonischemic cardiomyopathy, hyperlipidemia, hypothyroidism, traumatic intraparenchymal right frontal bleed in January 2021,  presenting with slurred speech, difficulty walking, left-sided weakness. D/c from ED w/  neg CT head. [place] getting out of the car and brought back to hospital with worsened L sided weakness.  Stroke:   R MCA infarct most likely embolic secondary to AF on warfarin,   CT head No acute abnormality. Small vessel disease. Atrophy. Old scattered lacunes  CTA head & neck no LVO. L ICA bifurcation 50% stenosis. Moderate multifocal intracranial atherosclerosis.   MRI  R deep insula and radiating white matter infarct. Small vessel disease. Atrophy. Old R frontal white matter infarct. Abnormal flow distal L VA, unrelated.  2D Echo EF 60 to 65%.  No clot  LDL 77  HgbA1c 5.5  Warfarin for VTE prophylaxis  warfarin daily prior to admission w/ INR 2.0, now on warfarin daily.  Recommend change warfarin to Eliquis  Therapy recommendations:  SNF  Disposition:  Pending   Atrial Fibrillation  Home anticoagulation:  warfarin daily continued in the hospital. Pharmacy on board  Last INR 2..1   Hypertension  Stable . Permissive hypertension (OK if < 220/120) but gradually normalize in 5-7 days . Long-term BP goal normotensive  Hyperlipidemia  Home meds:  lipitor 10, resumed in hospital  LDL 77, goal < 70  Increase statin to lipitor 20 given acute stroke   Continue statin at discharge  Dysphagia . Secondary to stroke . Cleared for D3 nectar thick liquids . Speech on board   Other Stroke Risk Factors  Advanced age  former Cigarette smoker  ETOH use, advised to drink no more than 2 drink(s) a day  Coronary artery disease  NICM s/p pacer  Hx chronic systolic CHF  Other Active Problems  PMR  Hx traumatic R frontal lobe hmg Jan 2021 following a fall  Hypothyroidism  On synthroid   Hospital day # 1 He presented with embolic right MCA  branch infarct despite being on optimal anticoagulation with warfarin for 2.0 from his A. fib.  Recommend switch warfarin to Eliquis for secondary stroke prevention as he has failed warfarin.  Continue ongoing aggressive risk factor modification and physical occupational and speech therapies. He will likely need to be transferred to rehab when bed becomes available.  Discussed with Dr. Posey Pronto and patient's son and answered questions.  Greater than 50% time during this 25-minute visit was spent on counseling and coordination of care and answering questions.  Stroke team will sign off.  Kindly call for questions.  Follow-up as an outpatient stroke clinic in 6 weeks. Antony Contras, MD Medical Director [facility] Pager: [phone] 09/11/2019 1:55 PM  To contact Stroke Continuity provider, please refer to [email or web address] After hours, contact General Neurology

## 2019-09-11 NOTE — Progress Notes (Signed)
SLP Cancellation Note  Patient Details Name: [name] MRN: [id number] DOB: 08/05/1925   Cancelled treatment:       Reason Eval/Treat Not Completed: Fatigue/lethargy limiting ability to participate(when returned for SLE after approx 15 minutes, pt nodding off while SlP speaking to son and increasing dysarthria noted, hold SLE/cog eval - pt and son agree)  Kathleen Lime, Home SLP [place] Office [phone]  [name] 09/11/2019, 10:13 AM

## 2019-09-11 NOTE — Progress Notes (Signed)
ANTICOAGULATION CONSULT NOTE   Pharmacy Consult for warfarin>> apixaban Indication: atrial fibrillation  No Known Allergies  Patient Measurements: Height: 5\' 9"  (175.3 cm) Weight: 68 kg (150 lb) IBW/kg (Calculated) : 70.7   Vital Signs: Temp: 97.9 F (36.6 C) (05/11 0344) Temp Source: Oral (05/11 0344) BP: 159/76 (05/11 0344) Pulse Rate: 69 (05/11 0344)  Labs: Recent Labs    09/09/19 2219 09/10/19 0621 09/10/19 1601 09/11/19 0317  HGB  --  12.8*  --  13.3  HCT  --  38.9*  --  40.7  PLT  --  127*  --  135*  LABPROT 22.3*  --  23.1* 23.1*  INR 2.0*  --  2.1* 2.1*  CREATININE  --  0.81  --  1.13    Estimated Creatinine Clearance: 41 mL/min (by C-G formula based on SCr of 1.13 mg/dL).   Medical History: Past Medical History:  Diagnosis Date  . Hypertension   . Hypothyroidism   . Presence of permanent cardiac pacemaker      Assessment: 68 yoM with hx AFib on warfarin admitted with slurred speech and found to have CVA. Pharmacy to transition to [facility] -INR remains at 2.1 today. Warfarin discontinued, last dose taken 5/9 prior to admission. -CT head, MRI no acute bleeding.  - No bleeding reported.  The manufacturer of apixaban recommends starting apixaban when INR < 2.0.  His dose will be 5 mg BID (age >80, wt>60 kg, SCr <1.5) for h/o Atrial fibrillation, stroke prevention.   Goal of Therapy:  INR 2-3 Monitor platelets by anticoagulation protocol: Yes   Plan:  -Warfarin discontinued, last dose taken 5/9 prior to admission -Start apixaban when INR < 2.0 ,  His dose will be 5 mg BID -Daily PT/INR -ordered TOC consult for benefits check on Elquis.  - will need to educate patient prior to discharge.  Nicole Cella, RPh Clinical Pharmacist [phone]  **Pharmacist phone directory can now be found on [email or web address] ([email or web address]).  Listed under [place]. 09/11/2019 8:43 AM

## 2019-09-11 NOTE — Progress Notes (Signed)
  Echocardiogram 2D Echocardiogram has been performed.  [name] 09/11/2019, 9:37 AM

## 2019-09-11 NOTE — Progress Notes (Signed)
  Speech Language Pathology Treatment:    Patient Details Name: [name] MRN: [id number] DOB: 1925/08/13 Today's Date: 09/11/2019 Time: [id number] SLP Time Calculation (min) (ACUTE ONLY): 20 min  Assessment / Plan / Recommendation Clinical Impression  Pt seen to assess po tolerance and for education with family.  Upon arrival, pt sitting upright and eating breakfast.  SLP raised HOB further for maximal safety.  Reviewed MBS with pt and his son to educate regarding current diet and mitigation strategies.  Pt with minimal prolonged mastication and trace residuals of eggs in left lateral sulci.  Delayed cough x1 after completion of meal that may indicate aspiration. Pt benefited from moderate verbal cues to follow swallow precautions and verbalize silent aspiration.  Using teach back, pt educated to precautions.  Pt will benefit from SNF given current level of dysphagia, decreased compensation strategy comprehension and weakness increasing his aspiration pna risk.  Will follow up for speech/language evaluation.     HPI HPI: 86 yo male adm to [facility] with weakness, dysarthria and fall with concern for acute CVA. Pt has h/o falling and hitting his head.  Pt CT scan head showed chronic lacunar CVAs x2 basal ganglia, small right cerebellar CVA.  Pt has h/o Bell's palsy 20 years prior, remote smoking/cigar use ceasing 40 years ago.  He failed a Audiological scientist and SLP was ordered. MRI showed right deep insula CVA and radiating to white matter tracts.      SLP Plan  Continue with current plan of care       Recommendations  Diet recommendations: Dysphagia 3 (mechanical soft);Nectar-thick liquid Liquids provided via: Cup;No straw;Teaspoon Medication Administration: Whole meds with puree Supervision: Full supervision/cueing for compensatory strategies Compensations: Slow rate;Small sips/bites;Effortful swallow;Other (Comment)(intermittent cough and "Hock") Postural Changes and/or Swallow Maneuvers: Seated  upright 90 degrees;Upright 30-60 min after meal                Oral Care Recommendations: Oral care QID SLP Visit Diagnosis: Dysphagia, oropharyngeal phase (R13.12) Plan: Continue with current plan of care       GO              Kathleen Lime, MS [place] Office [phone]   [name] 09/11/2019, 8:31 AM

## 2019-09-11 NOTE — Progress Notes (Signed)
[facility] Progress Note  Patient: [name]    [id number]  DOA: 09/09/2019     Date of Service: the patient was seen and examined on 09/11/2019  Chief Complaint  Patient presents with  . Fall   Brief hospital course: Past medical history of HTN, hypothyroidism, chronic systolic CHF, A. fib on Coumadin, traumatic ICH in January 2021, PPM implant.  Patient presented with left-sided weakness found to have acute stroke on MRI. Currently further plan is arranged rehab and complete stroke work-up.  Assessment and Plan: 1.  Acute CVA with left-sided weakness Likely embolic secondary to A. fib CT head no acute abnormality. CTA head and neck no significant normality. Left ICA bifurcation 50% stenosis. MRI brain positive for right deep insula and radiating white matter infarct. Neurology consulted. Currently recommending changing from warfarin to Eliquis for A. fib. Hemoglobin A1c 5.5. LDL 77.  Patient will be on Lipitor 40 mg daily.  2.  Chronic A. fib Patient on Coumadin. INR therapeutic at the time of admission. Currently we will transition to Eliquis per neurology recommendation. CT MRI negative for any acute bleeding.  3.  Essential hypertension Permissive hypertension for now. Monitor.  4.  Hyperlipidemia On Lipitor 10 mg daily. 02 March 2019 milligrams.  5.  Dysphagia Currently on dysphagia 3 diet nectar thick liquid per speech therapy. Underwent modified barium swallowing.  6.  Chronic systolic CHF. CAD. Nonischemic cardiomyopathy. Pacemaker implant. Currently stable. Monitor on telemetry.  7.  Polymyalgia rheumatica Not on prednisone. Monitor.  8.  Hypothyroidism Continue Synthroid.  9.  Recent intracranial hemorrhage After a fall in January 21. Monitor.  Patient from [facility].  Presents with acute stroke with severe limiting disability, left hemiparesis, dysarthria and dysphagia. Per speech therapy and PT as well as neurology, patient needs  SNF level care. Recent history of falls leading to intracranial hemorrhage on Coumadin. Remains on anticoagulation on discharge with Eliquis. Now even weaker than before due to stroke. Patient was discharged from the ER on the day of admission and had a readmission. Patient remains at risk for readmission and poor outcome given his acute stroke.  Currently discharge to ALF remains unsafe.  Diet: Dysphagia diet DVT Prophylaxis: Therapeutic Anticoagulation with Eliquis   Advance goals of care discussion: Full code  Family Communication: family was present at bedside, at the time of interview.   Disposition:  Status is: Inpatient  Remains inpatient appropriate because: See above.  Unsafe discharge plan.  Dispo: The patient is from: Home              Anticipated d/c is to: SNF              Anticipated d/c date is: 2 days              Patient currently is not medically stable to d/c.  Subjective: No acute events overnight.  No nausea no vomiting.  Continues to have difficulties swallowing and continues to have  Physical Exam: General:  alert oriented to time, place, and person.  Appear in mild distress, affect appropriate Eyes: PERRL ENT: Oral Mucosa Clear, moist  Neck: no JVD,  Skin: no rash Extremities: no Pedal edema,  Neurologic: Alert awake and oriented. Mild dysarthria. Left facial droop. Midline tongue. Left upper and lower extremity motor weakness. Positive pronator drift on left  Gait not checked due to patient safety concerns  Vitals:   09/11/19 1015 09/11/19 1225 09/11/19 1620 09/11/19 1943  BP: (!) 142/73 (!) 153/73 (!) 143/63 Marland Kitchen)  167/69  Pulse: 61 60 66 60  Resp: 18 19 18 18   Temp: 98.2 F (36.8 C) 98 F (36.7 C) 98.1 F (36.7 C) 98.1 F (36.7 C)  TempSrc: Oral Oral Oral Oral  SpO2: 94% 96% 96% 100%  Weight:      Height:        Intake/Output Summary (Last 24 hours) at 21-Sep-2019 1951 Last data filed at 09-21-19 1946 Gross per 24 hour  Intake 177 ml    Output 550 ml  Net -373 ml   Filed Weights   09/09/19 2123  Weight: 68 kg    Data Reviewed: I have personally reviewed and interpreted daily labs, tele strips, imagings as discussed above. I reviewed all nursing notes, pharmacy notes, vitals, pertinent old records I have discussed plan of care as described above with RN and patient/family.  CBC: Recent Labs  Lab 09/10/19 0621 2019/09/21 0317  WBC 6.2 7.1  HGB 12.8* 13.3  HCT 38.9* 40.7  MCV 92.6 92.9  PLT 127* [id number]*   Basic Metabolic Panel: Recent Labs  Lab 09/10/19 0621 09/21/2019 0317  NA 138 139  K 4.3 3.6  CL 106 103  CO2 22 27  GLUCOSE 99 113*  BUN 15 20  CREATININE 0.81 1.13  CALCIUM 9.0 9.0    Studies: EEG adult  Result Date: 09-21-19 Lora Havens, MD     Sep 21, 2019 10:23 AM Patient Name: [name] MRN: [id number] Epilepsy Attending: Lora Havens Referring Physician/Provider: Dr. Mitzi Hansen Date: 09/10/2019 Duration: 27.08 mins Patient history: 86 year old male who presented with sudden onset of slurred speech left-sided weakness and difficulty walking.  EEG to evaluate for seizures. Level of alertness: Awake, asleep AEDs during EEG study: None Technical aspects: This EEG study was done with scalp electrodes positioned according to the 10-20 International system of electrode placement. Electrical activity was acquired at a sampling rate of 500Hz  and reviewed with a high frequency filter of 70Hz  and a low frequency filter of 1Hz . EEG data were recorded continuously and digitally stored. Description: During awake state, no clear posterior dominant was seen. Sleep was characterized by vertex waves and intermittent generalized 2 to 3 Hz delta slowing.  EEG also showed generalized polymorphic mixed frequencies with predominantly 8-13 Hz alpha-beta activity. Hyperventilation and photic stimulation were not performed. IMPRESSION: This study is within normal limits. No seizures or epileptiform discharges were seen  throughout the recording. Lora Havens   ECHOCARDIOGRAM COMPLETE  Result Date: 21-Sep-2019    ECHOCARDIOGRAM REPORT   Patient Name:   [name] Date of Exam: 2019/09/21 Medical Rec #:  [id number]      Height:       69.0 in Accession #:    [id number]     Weight:       150.0 lb Date of Birth:  [birth year]      BSA:          1.828 m Patient Age:    13 years       BP:           159/76 mmHg Patient Gender: M              HR:           68 bpm. Exam Location:  Inpatient Procedure: 2D Echo, Cardiac Doppler and Color Doppler Indications:    Stroke 434.91 / I163.9  History:        Patient has no prior history of Echocardiogram examinations.  Sonographer:  Tiffany Dance Referring Phys: [id number] [place]  1. Left ventricular ejection fraction, by estimation, is 60 to 65%. The left ventricle has normal function. The left ventricle has no regional wall motion abnormalities. Normal left ventricular annular diastolic velocities are consistent with normal left ventricular relaxation properties and normal left atrial pressure. Diminutive atrial contraction-related velocities are suggestive of recent atrial fibrillation with stunning or left atrial mechanical failure.  2. Right ventricular systolic function is normal. The right ventricular size is normal. Tricuspid regurgitation signal is inadequate for assessing PA pressure.  3. Left atrial size was mildly dilated.  4. The mitral valve is normal in structure. Trivial mitral valve regurgitation.  5. The aortic valve is normal in structure. Aortic valve regurgitation is trivial.  6. Aortic dilatation noted. There is mild dilatation of the ascending aorta measuring 40 mm.  7. The inferior vena cava is normal in size with greater than 50% respiratory variability, suggesting right atrial pressure of 3 mmHg. FINDINGS  Left Ventricle: Left ventricular ejection fraction, by estimation, is 60 to 65%. The left ventricle has normal function. The left ventricle has no  regional wall motion abnormalities. The left ventricular internal cavity size was normal in size. There is  no left ventricular hypertrophy. Normal left ventricular annular diastolic velocities are consistent with normal left ventricular relaxation properties and normal left atrial pressure. Diminutive atrial contraction-related velocities are suggestive of recent atrial fibrillation with stunning or left atrial mechanical failure. Right Ventricle: The right ventricular size is normal. No increase in right ventricular wall thickness. Right ventricular systolic function is normal. Tricuspid regurgitation signal is inadequate for assessing PA pressure. Left Atrium: Left atrial size was mildly dilated. Right Atrium: Right atrial size was normal in size. Pericardium: A small pericardial effusion is present. The pericardial effusion is circumferential. There is no evidence of cardiac tamponade. Mitral Valve: The mitral valve is normal in structure. Trivial mitral valve regurgitation. Tricuspid Valve: The tricuspid valve is normal in structure. Tricuspid valve regurgitation is not demonstrated. Aortic Valve: The aortic valve is normal in structure. Aortic valve regurgitation is trivial. Pulmonic Valve: The pulmonic valve was not well visualized. Pulmonic valve regurgitation is not visualized. Aorta: The aortic root is normal in size and structure and aortic dilatation noted. There is mild dilatation of the ascending aorta measuring 40 mm. Venous: The inferior vena cava is normal in size with greater than 50% respiratory variability, suggesting right atrial pressure of 3 mmHg. IAS/Shunts: No atrial level shunt detected by color flow Doppler. Additional Comments: A pacer wire is visualized.  LEFT VENTRICLE PLAX 2D LVIDd:         5.49 cm LV PW:         1.22 cm LV IVS:        1.01 cm LVOT diam:     2.10 cm LV SV:         56 LV SV Index:   31 LVOT Area:     3.46 cm  RIGHT VENTRICLE             IVC RV Basal diam:  2.33 cm      IVC diam: 1.54 cm RV S prime:     12.40 cm/s TAPSE (M-mode): 2.1 cm LEFT ATRIUM             Index       RIGHT ATRIUM           Index LA diam:        4.30 cm  2.35 cm/m  RA Area:     14.10 cm LA Vol (A2C):   65.8 ml 35.99 ml/m RA Volume:   31.30 ml  17.12 ml/m LA Vol (A4C):   55.3 ml 30.25 ml/m LA Biplane Vol: 61.8 ml 33.80 ml/m  AORTIC VALVE LVOT Vmax:   78.00 cm/s LVOT Vmean:  50.950 cm/s LVOT VTI:    0.161 m  AORTA Ao Root diam: 4.00 cm Ao Asc diam:  4.00 cm MITRAL VALVE MV Area (PHT): 1.78 cm    SHUNTS MV Decel Time: 426 msec    Systemic VTI:  0.16 m MV E velocity: 62.10 cm/s  Systemic Diam: 2.10 cm Mihai Croitoru MD Electronically signed by Sanda Klein MD Signature Date/Time: 09/11/2019/10:11:23 AM    Final     Scheduled Meds: .  stroke: mapping our early stages of recovery book   Does not apply Once  . atorvastatin  40 mg Oral Daily  . levothyroxine  50 mcg Oral Q0600  . sodium chloride flush  3 mL Intravenous Q12H   Continuous Infusions: . sodium chloride     PRN Meds: sodium chloride, acetaminophen **OR** acetaminophen (TYLENOL) oral liquid 160 mg/5 mL **OR** acetaminophen, labetalol, Resource ThickenUp Clear, senna-docusate, sodium chloride flush  Time spent: 35 minutes  Author: Berle Mull, MD [facility] 09/11/2019 7:51 PM  To reach On-call, see care teams to locate the attending and reach out to them via [email or web address]. If 7PM-7AM, please contact night-coverage If you still have difficulty reaching the attending provider, please page the [facility] (Director on Call) for [facility] on amion for assistance.

## 2019-09-11 NOTE — TOC Benefit Eligibility Note (Signed)
Transition of Care [facility]) Benefit Eligibility Note    Patient Details  Name: NEQUAN FRANCHINA MRN: [id number] Date of Birth: 05/25/1925   Medication/Dose: Arne Cleveland  5 MG BID  Covered?: Yes  Tier: (TIER-4 DRUG)  Prescription Coverage Preferred Pharmacy: [facility] with Person/Company/Phone Number:: [facility]  @ [facility] # [phone]  Co-Pay: $  484.72  Prior Approval: No  Deductible: Unmet(OUT-OF-POCKET: UNMET)  Additional Notes: APIXABAN : Crecencio Mc Phone Number: 09/11/2019, 12:24 PM

## 2019-09-11 NOTE — Procedures (Signed)
Patient Name: [name]  MRN: [id number]  Epilepsy Attending: Lora Havens  Referring Physician/Provider: Dr. Mitzi Hansen Date: 09/10/2019 Duration: 27.08 mins  Patient history: 86 year old male who presented with sudden onset of slurred speech left-sided weakness and difficulty walking.  EEG to evaluate for seizures.  Level of alertness: Awake, asleep  AEDs during EEG study: None  Technical aspects: This EEG study was done with scalp electrodes positioned according to the 10-20 International system of electrode placement. Electrical activity was acquired at a sampling rate of 500Hz  and reviewed with a high frequency filter of 70Hz  and a low frequency filter of 1Hz . EEG data were recorded continuously and digitally stored.   Description: During awake state, no clear posterior dominant was seen. Sleep was characterized by vertex waves and intermittent generalized 2 to 3 Hz delta slowing.  EEG also showed generalized polymorphic mixed frequencies with predominantly 8-13 Hz alpha-beta activity. Hyperventilation and photic stimulation were not performed.  IMPRESSION: This study is within normal limits. No seizures or epileptiform discharges were seen throughout the recording.  Atha Muradyan Barbra Sarks

## 2019-09-12 ENCOUNTER — Inpatient Hospital Stay (HOSPITAL_COMMUNITY): Payer: Medicare Other

## 2019-09-12 DIAGNOSIS — R4781 Slurred speech: Secondary | ICD-10-CM

## 2019-09-12 LAB — CBC
HCT: 40.5 % (ref 39.0–52.0)
Hemoglobin: 13.1 g/dL (ref 13.0–17.0)
MCH: 30.2 pg (ref 26.0–34.0)
MCHC: 32.3 g/dL (ref 30.0–36.0)
MCV: 93.3 fL (ref 80.0–100.0)
Platelets: 124 10*3/uL — ABNORMAL LOW (ref 150–400)
RBC: 4.34 MIL/uL (ref 4.22–5.81)
RDW: 13.6 % (ref 11.5–15.5)
WBC: 6.2 10*3/uL (ref 4.0–10.5)
nRBC: 0 % (ref 0.0–0.2)

## 2019-09-12 LAB — PROTIME-INR
INR: 1.8 — ABNORMAL HIGH (ref 0.8–1.2)
Prothrombin Time: 20.2 seconds — ABNORMAL HIGH (ref 11.4–15.2)

## 2019-09-12 LAB — BASIC METABOLIC PANEL
Anion gap: 11 (ref 5–15)
BUN: 28 mg/dL — ABNORMAL HIGH (ref 8–23)
CO2: 26 mmol/L (ref 22–32)
Calcium: 8.9 mg/dL (ref 8.9–10.3)
Chloride: 103 mmol/L (ref 98–111)
Creatinine, Ser: 1.17 mg/dL (ref 0.61–1.24)
GFR calc Af Amer: >60 mL/min (ref >60)
GFR calc non Af Amer: 54 mL/min — ABNORMAL LOW (ref >60)
Glucose, Bld: 104 mg/dL — ABNORMAL HIGH (ref 70–99)
Potassium: 4.2 mmol/L (ref 3.5–5.1)
Sodium: 140 mmol/L (ref 135–145)

## 2019-09-12 LAB — TSH: TSH: 3.939 u[IU]/mL (ref 0.350–4.500)

## 2019-09-12 LAB — SARS CORONAVIRUS 2 (TAT 6-24 HRS): SARS Coronavirus 2: NEGATIVE

## 2019-09-12 MED ORDER — WARFARIN - PHARMACIST DOSING INPATIENT: Freq: Every day | Status: DC

## 2019-09-12 MED ORDER — ASPIRIN 325 MG PO TABS
325.0000 mg | ORAL_TABLET | Freq: Every day | ORAL | Status: DC
  Administered 2019-09-12 – 2019-09-13 (×2): 325 mg via ORAL
  Filled 2019-09-12 (×3): qty 1

## 2019-09-12 MED ORDER — WARFARIN SODIUM 6 MG PO TABS
6.0000 mg | ORAL_TABLET | Freq: Once | ORAL | Status: AC
  Administered 2019-09-12: 6 mg via ORAL
  Filled 2019-09-12: qty 1

## 2019-09-12 NOTE — Progress Notes (Signed)
Modified Barium Swallow Progress Note  Patient Details  Name: [name] MRN: [id number] Date of Birth: November 08, 1925  Today's Date: 09/12/2019  Modified Barium Swallow completed.  Full report located under Chart Review in the Imaging Section.  Brief recommendations include the following:  Clinical Impression  Mr. Olshansky was seen for a repeat MBS with concern for worsening swallow function compared to two days prior (see MBS note 5/10). Pt does demonstrate a mildly worsened dysphagia (moderate-severe pharyngeal dysphagia) with increased pharyngeal weakness.  Pharyngeally, pt also demonstrates reduced sensation with absent swallow trigger noted intermittently with bolus resting in valleculae/pyriform sinus, requiring a cue to swallow. Pt had trace aspiration noted with thin liquids via spoon/cup, nectar thick liquids via cup/straw, honey thick liquids via cup. Most instances of aspiration were from residue (versus from the initial swallow). He was only noted to sense aspiration x1 with cough response. Pt naturally lowers chin, which greatly worsens the aspiration. With pt being told to take small sips, he begins well, with use of small sips, but then takes larger sips, resulting in aspiration. Given use of spoon, pt still had some penetration from residue after the swallow, but no aspiration was visualized. He had no penetration/aspiration of purees or ground solids, but does continue with trace-mild residue in valleculae and pyriform sinus. Overall, pt is at high risk for aspiration/aspiration PNA, however, given use of spoon with nectar thick liquids and ground solids, pt was seen with improved performance. D/t high risk of dehydration on this nectar thick (spoon) consistency, recommend he continue with free water protocol after oral care. Water may be consumed by cup. Pt thoroughly educated to continue prior precautions of coughing intermittently and keeping head in neutral position. He demonstrated  understanding and agreement.   Swallow Evaluation Recommendations   SLP Diet Recommendations: Nectar thick liquid;Dysphagia 2 (Fine chop) solids   Liquid Administration via: Spoon   Medication Administration: Whole meds with puree   Supervision: Staff to assist with self feeding   Compensations: Small sips/bites;Hard cough after swallow       Oral Care Recommendations: Oral care BID     Meela Wareing P. Pierre Cumpton, M.S., CCC-SLP Speech-Language Pathologist Acute Rehabilitation Services Pager: [phone] 09/12/2019,2:04 PM

## 2019-09-12 NOTE — Progress Notes (Signed)
Occupational Therapy Treatment Patient Details Name: [name] MRN: [id number] DOB: [birth year] Today's Date: 09/12/2019    History of present illness 86 y.o. male with medical history significant for polymyalgia rheumatica, CKD, cardiomyopathy, hypertension, hypothyroidism, chronic systolic CHF, atrial fibrillation on warfarin, traumatic Rt frontal ICH in January 2021, and sick sinus syndrome with pacemaker, presented 09/09/19 to the emergency department with left-sided weakness, slurred speech, and ambulatory difficulty with falls. Initial head CT scan head showed chronic lacunar CVAs x2 basal ganglia, chronic small right cerebellar CVA and no acute changes and pt discharged home, where he again fell and returned to ED (same day). MRI right deep insula and radiating white matter infarct.    OT comments  Cotx with PT to maximize pt safety and functional performance. Pt has had a significant decline in function compared to eval date with RN notified. This session pt demonstrates decreased LUE function, left facial droop, and increased slurred speech. On eval date pt was able to achieve 90 degrees of active left shld flex. Currently contraction is palpated with minimal movement noted in LUE. Session focused on trunk control and midline alignment. Pt tolerated sitting EOB 15+ min with variable min to max assist to maintain balance. Continual retro and left lateral lean with pt requiring cues and assist to self-correct. Worked on lateral leans, weight bearing through LUE, and seated crunches while EOB. Pt tolerated standing x 1 with RW able to take side steps up towards [facility] with mod assist x 2. Pt unable to maintain left hand on RW, which he was able to do in previous session. Pt assisted back to bed and positioned for comfort. OT will continue to follow acutely. Continue to recommend SNF placement for additional rehab prior to discharge home.    Follow Up Recommendations  SNF;Supervision/Assistance -  24 hour    Equipment Recommendations  Other (comment)(TBD at next venue of care)    Recommendations for Other Services      Precautions / Restrictions Precautions Precautions: Fall Precaution Comments: reports at least 3 falls in 6 months Restrictions Weight Bearing Restrictions: No       Mobility Bed Mobility Overal bed mobility: Needs Assistance Bed Mobility: Supine to Sit;Sit to Supine     Supine to sit: Mod assist;+2 for physical assistance;HOB elevated Sit to supine: Mod assist;+2 for physical assistance   General bed mobility comments: Pt able to initiate moving BLEs over to EOB. Cues for hand placement. Assist to power torso up into sitting.  Transfers Overall transfer level: Needs assistance Equipment used: Rolling walker (2 wheeled) Transfers: Sit to/from Stand Sit to Stand: Mod assist;+2 physical assistance;+2 safety/equipment         General transfer comment: Assist to power up into standing. LLE buckling in standing. Unable to grip RW with left hand requiring assist to maintain position.     Balance Overall balance assessment: Needs assistance;History of Falls Sitting-balance support: Feet supported;Single extremity supported Sitting balance-Leahy Scale: Poor Sitting balance - Comments: at EOB x ~15 minutes with varying assist (min to max assist--tends to lose balance to left or posteriorly). Worked on lateral leans, weight bearing through LUE, and seated crunches.  Postural control: Posterior lean;Left lateral lean Standing balance support: Bilateral upper extremity supported Standing balance-Leahy Scale: Poor Standing balance comment: forward flexed; RUE support on RW; left hand unable to grip RW; able to static stand with min assist  ADL either performed or assessed with clinical judgement   ADL                                               Vision       Perception     Praxis       Cognition Arousal/Alertness: Awake/alert Behavior During Therapy: WFL for tasks assessed/performed Overall Cognitive Status: Within Functional Limits for tasks assessed                 Rancho Levels of Cognitive Functioning [place] [employer] Scales of Cognitive Functioning: Purposeful/appropriate                        Exercises     Shoulder Instructions       General Comments Consulted with RN following session due to concern for change in status.     Pertinent Vitals/ Pain       Pain Assessment: No/denies pain  Home Living     Available Help at Discharge: Family;Available 24 hours/day Type of Home: Independent living facility                              Lives With: Spouse    Prior Functioning/Environment              Frequency           Progress Toward Goals  OT Goals(current goals can now be found in the care plan section)  Progress towards OT goals: Not progressing toward goals - comment(worsening of LUE function since evaluation 2 days prior)  Acute Rehab OT Goals Patient Stated Goal: to get stronger ADL Goals Pt Will Perform Grooming: sitting;with min assist Pt Will Perform Upper Body Bathing: with min assist;sitting Pt Will Perform Lower Body Dressing: with mod assist;sit to/from stand Pt Will Transfer to Toilet: with min assist;with +2 assist;bedside commode Pt Will Perform Toileting - Clothing Manipulation and hygiene: with mod assist;with 2+ total assist Pt/caregiver will Perform Home Exercise Program: Increased ROM;Left upper extremity;With Supervision;With written HEP provided Additional ADL Goal #1: Pt to incorporate use of LUE into all tasks with min cues. Additional ADL Goal #2: Pt to tolerate standing up to 5 min with min assist, in preparation for ADLs.  Plan Discharge plan remains appropriate    Co-evaluation    PT/OT/SLP Co-Evaluation/Treatment: Yes Reason for Co-Treatment: For patient/therapist  safety;To address functional/ADL transfers PT goals addressed during session: Mobility/safety with mobility;Balance;Proper use of DME OT goals addressed during session: ADL's and self-care;Strengthening/ROM      AM-PAC OT "6 Clicks" Daily Activity     Outcome Measure                    End of Session Equipment Utilized During Treatment: Gait belt;Rolling walker  OT Visit Diagnosis: Unsteadiness on feet (R26.81);Muscle weakness (generalized) (M62.81);History of falling (Z91.81);Hemiplegia and hemiparesis Hemiplegia - Right/Left: Left Hemiplegia - dominant/non-dominant: Non-Dominant Hemiplegia - caused by: Cerebral infarction   Activity Tolerance Patient tolerated treatment well   Patient Left in bed;with call bell/phone within reach;with bed alarm set   Nurse Communication Mobility status        Time: 0927-1000 OT Time Calculation (min): 33 min  Charges: OT General Charges $OT Visit: 1 Visit OT Treatments $Therapeutic Activity: 8-22 mins  Mauri Brooklyn OTR/L [phone]   Mauri Brooklyn 09/12/2019, 1:45 PM

## 2019-09-12 NOTE — Evaluation (Signed)
Speech Language Pathology Evaluation Patient Details Name: [name] MRN: [id number] DOB: 1925/05/22 Today's Date: 09/12/2019 Time: 1030-1040 SLP Time Calculation (min) (ACUTE ONLY): 10 min  Problem List:  Patient Active Problem List   Diagnosis Date Noted  . Cerebral embolism with cerebral infarction 09/10/2019  . CVA (cerebral vascular accident) ([place]) 09/10/2019  . Acute left-sided weakness 09/09/2019  . AF (paroxysmal atrial fibrillation) ([place]) 09/09/2019  . Hypothyroidism 09/09/2019  . Essential hypertension 09/09/2019  . Chronic systolic CHF (congestive heart failure) ([place]) 09/09/2019  . SSS (sick sinus syndrome) ([place]) 09/09/2019   Past Medical History:  Past Medical History:  Diagnosis Date  . Hypertension   . Hypothyroidism   . Presence of permanent cardiac pacemaker    Past Surgical History: History reviewed. No pertinent surgical history. HPI:  86 yo male adm to [facility] with weakness, dysarthria and fall with concern for acute CVA.  Pt CT scan head showed chronic lacunar CVAs x2 basal ganglia, small right cerebellar CVA.  MRI showed insular CVA. Concern for worsening symptoms, so new CT has been ordered to r/o extension.   Assessment / Plan / Recommendation Clinical Impression  [name] was assessed for speech/language and cognitive impairments. Pt demonstrates a mild-moderate flaccid dysarthria c/b imprecise consonants, specifically fricatives and affricates, final consonant deletion, and reduced vocal intensity. Overall intelligibility is at roughly 80% to unknown listener with min cues required for repetition and increasing volume. Pt completed Tikofsky's 50-word intelligibility test with intelligibility in 39, making him 78% intelligible. Cognition was assessed with the Mini-Mental Status Examination. See results below. Cognition is WNL and quite remarkable for advanced age. He also answered informal questions regarding IADL tasks (med/money mgmt). He had 100% acc'y on  those tasks, however, son who was present reports he plans to begin handling these tasks at d/c anyway. Suspect a possible L visual inattention, based on his need for cues to attend to L during writing task. Pt/son were provided a written handout of speech strategies (SLOPE) for speak slowly, loudly, open mouth, pause between words, every sound counts. Strategies were reviewed and pt demonstrated understanding of their use. ST to follow during acute hospitalization for speech therapy as well as dysphagia therapy (see BSE/MBS notes). He is expected to benefit from continued therapy at next level of care.  Mini-Mental Status Examination: MMSE Orientation: 10/10 Memory/Coding: 3/3 Working Memory/Calculatiosn: 5/5 Memory/Recall: 2/3 Naming: 2/2 Repetition: 1/1 Following Directions: 1/1 Sentence Writing: 1/1 Visuospatial Processing: 1/1    SLP Assessment  SLP Recommendation/Assessment: Patient needs continued Speech Lanaguage Pathology Services SLP Visit Diagnosis: Dysphagia, pharyngeal phase (R13.13);Dysarthria and anarthria (R47.1)    Follow Up Recommendations    Ongoing SLP at next level of care (SNF/ALF)   Frequency and Duration min 2x/week  2 weeks      SLP Evaluation Cognition  Overall Cognitive Status: Within Functional Limits for tasks assessed Arousal/Alertness: Awake/alert Orientation Level: Oriented X4 Attention: Focused Focused Attention: Appears intact Memory: Appears intact Safety/Judgment: Impaired [place] Scales of Cognitive Functioning: Purposeful/appropriate       Comprehension  Auditory Comprehension Overall Auditory Comprehension: Appears within functional limits for tasks assessed Commands: Within Functional Limits Reading Comprehension Reading Status: Within funtional limits    Expression Expression Primary Mode of Expression: Verbal Verbal Expression Overall Verbal Expression: Appears within functional limits for tasks assessed Initiation: No  impairment Written Expression Dominant Hand: Right Written Expression: Within Functional Limits   Oral / Motor  Oral Motor/Sensory Function Overall Oral Motor/Sensory Function: Moderate impairment Facial ROM:  Reduced left Facial Symmetry: Abnormal symmetry left Facial Strength: Reduced left Facial Sensation: Reduced left Lingual ROM: Reduced left Lingual Symmetry: Abnormal symmetry left Lingual Strength: Reduced Velum: Within Functional Limits Mandible: Within Functional Limits Motor Speech Overall Motor Speech: Impaired Respiration: Within functional limits Phonation: Normal Resonance: Within functional limits Articulation: Impaired Level of Impairment: Conversation Intelligibility: Intelligibility reduced Word: 75-100% accurate Phrase: 75-100% accurate Sentence: 75-100% accurate Conversation: 75-100% accurate Effective Techniques: Increased vocal intensity;Over-articulate               [name], M.S., [place] Pathologist Acute Rehabilitation Services Pager: [place] 09/12/2019, 11:16 AM

## 2019-09-12 NOTE — TOC Benefit Eligibility Note (Signed)
Transition of Care [facility]) Benefit Eligibility Note    Patient Details  Name: [name] MRN: [id number] Date of Birth: 1925/06/26   Medication/Dose: Alveda Reasons 15 MG BID  Covered?: Yes  Tier: 3 Drug  Prescription Coverage [facility]  Spoke with Person/Company/Phone Number:: CHRISTINE  @ [employer] # [phone]  Co-Pay: $491.00  Prior Approval: No  Deductible: Unmet(OUT-OF-POCKET:UNMET)  Additional Notes: PRADAXA  150 MG BID, COVER-YES  CO-PAY- $473.32  TIER-4 DRUG  P/A-NO    Memory Argue Phone Number: 09/12/2019, 2:52 PM

## 2019-09-12 NOTE — Progress Notes (Addendum)
  Speech Language Pathology Treatment: Dysphagia  Patient Details Name: [name] MRN: [id number] DOB: 12-14-1925 Today's Date: 09/12/2019 Time: [id number] SLP Time Calculation (min) (ACUTE ONLY): 35 min  Assessment / Plan / Recommendation Clinical Impression  Mr Casimiro was seen for therapeutic diet tolerance 2/2 concerns from RN and PT regarding worsening tolerance of previously recommended diet. Son present in room who confirms he is no longer doing well with the thickened liquids and is having a great deal of coughing. PT also noted worsening LUE weakness, concerning for new or changed neuro event. DO ordered new head CT. With nectar thick liquids, pt had immediate cough response, which was wet. He also had prolonged/delayed cough after the liquid intake. Based on worsening tolerance and concern for neuro changes, recommend a repeat MBS to determine if swallow function has worsened and create appropriate diet recommendations. Pt/family/RN in agreement with plan.    HPI HPI: 86 yo male adm to [facility] with weakness, dysarthria and fall with concern for acute CVA.  Pt CT scan head showed chronic lacunar CVAs x2 basal ganglia, small right cerebellar CVA.  MRI showed insular CVA. Concern for worsening symptoms, so new CT has been ordered to r/o extension.      SLP Plan  MBS  Patient needs continued Speech Lanaguage Pathology Services    Recommendations  Diet recommendations: Ground;Nectar-thick liquid pending results of new study today Liquids provided via: Cup Supervision: Patient able to self feed Compensations: Hard cough after swallow;Small sips/bites Postural Changes and/or Swallow Maneuvers: Seated upright 90 degrees                SLP Visit Diagnosis: Dysarthria and anarthria (R47.1);Dysphagia, oropharyngeal phase (R13.12) Plan: MBS       Rilya Longo P. [place] Rollyson, M.S., CCC-SLP Speech-Language Pathologist Acute Rehabilitation Services Pager: [place] 09/12/2019, 11:26 AM

## 2019-09-12 NOTE — Progress Notes (Signed)
PROGRESS NOTE  [name] [id number] DOB: 10-21-1925 DOA: 09/09/2019 PCP: [name], MD  HPI/Recap of past 24 hours: Past medical history of HTN, hypothyroidism, chronic diastolic CHF, permanent A. fib on Coumadin, traumatic ICH in January 2021, PPM implant.  Patient presented with left-sided weakness found to have acute stroke on MRI. Currently further plan is arranged rehab and complete stroke work-up.  09/12/19: Seen and examined this morning.  He is alert oriented x3.  He reports persistent weakness in his left upper extremity.  Physical therapy reports notable worsening in his left upper extremity weakness.  Repeat CT head without contrast ordered.  We will continue frequent neurochecks.  Assessment/Plan: Principal Problem:   Acute left-sided weakness Active Problems:   AF (paroxysmal atrial fibrillation) (H[facility])   Hypothyroidism   Essential hypertension   Chronic systolic CHF (congestive heart failure) (H[facility])   SSS (sick sinus syndrome) (H[facility])   Cerebral embolism with cerebral infarction   CVA (cerebral vascular accident) ([place])   Right MCA infarct most likely embolic secondary to permanent A. fib on warfarin CT head no acute abnormality.  Small vessel disease.  Atrophy.  Old scattered lacunar MRI right deep insula and radiating white matter infarct.  Small vessel disease.  Atrophy.  Old right frontal white matter infarct.  2D echo showed LVEF 60 to 65% no thrombus, no PFO LDL 77 with goal of less than 70 A1c 5.5 with goal of less than 7.0. CTA head and neck no large vessel obstruction. L ICA bifurcation 50% stenosis.  Moderate multifocal intracranial atherosclerosis. Appears to have failed warfarin, neurology recommended DOAC. Case manager to assist with benefit checks for DOAC. Coumadin on hold to transition to [place] INR 1.8 Worsening left upper extremity weakness reported by physical therapy  Stat CT head without contrast ordered 5/12.  Follow results. Continue  neurochecks every 4 hours. Continue PT OT speech eval Continue Lipitor 40 mg daily Continue to allow permissive hypertension and gradually normalize BP Continue to closely monitor on telemetry  Permanent A. Fib Rate is currently controlled BP meds on hold to allow permissive hypertension Was on Coumadin, plan is to transition to [place].   Case manager assisting with benefit checks for coverage of DOAC INR is currently 1.8, managed by pharmacy  Dysphagia in the setting of acute CVA Speech therapist following Follow speech therapy recommendations Aspiration precautions  Essential hypertension Ongoing permissive hypertension Continue to monitor vital signs  Hyperlipidemia LDL 77, goal of 70 Continue Lipitor 40 mg daily  Polymyalgia rheumatica No acute issues Not on chronic steroid use  Hypothyroidism Add on TSH Continue Synthroid  History of traumatic intracranial hemorrhage post fall in January 2021 Continue to monitor Repeat CT head for reasons stated above   Disposition:  Status is: Inpatient  Remains inpatient appropriate because: See above.  Unsafe discharge plan.  Dispo: The patient is from: Home  Anticipated d/c is to: SNF  Anticipated d/c date is: 2 days  Patient currently is not medically stable to d/c due to worsening left upper extremity weakness, pending imaging.       Objective: Vitals:   09/11/19 2350 09/12/19 0417 09/12/19 0812 09/12/19 1036  BP: (!) 145/69 136/72 (!) 150/59 (!) 159/72  Pulse: 62 (!) 59 60 60  Resp: 18 18 16 18   Temp: 97.7 F (36.5 C) 98.2 F (36.8 C) 98.3 F (36.8 C) 98.2 F (36.8 C)  TempSrc: Oral Oral Oral Oral  SpO2: 99% 94% 94% 95%  Weight:  Height:        Intake/Output Summary (Last 24 hours) at 09/12/2019 1047 Last data filed at 09/12/2019 0840 Gross per 24 hour  Intake 297 ml  Output 550 ml  Net -253 ml   Filed Weights   09/09/19 2123  Weight: 68 kg     Exam:  . General: 86 y.o. year-old male well developed well nourished in no acute distress.  Alert and oriented x3. . Cardiovascular: Irregular rate and rhythm with no rubs or gallops. Marland Kitchen Respiratory: Clear to auscultation with no wheezes or rales. . Abdomen: Soft nontender nondistended with normal bowel sounds. . Musculoskeletal: No lower extremity edema. LUE 3/5 strength. . Psychiatry: Mood is appropriate for condition and setting   Data Reviewed: CBC: Recent Labs  Lab 09/10/19 0621 09/11/19 0317 09/12/19 0237  WBC 6.2 7.1 6.2  HGB 12.8* 13.3 13.1  HCT 38.9* 40.7 40.5  MCV 92.6 92.9 93.3  PLT 127* 135* [id number]*   Basic Metabolic Panel: Recent Labs  Lab 09/10/19 0621 09/11/19 0317 09/12/19 0237  NA 138 139 140  K 4.3 3.6 4.2  CL 106 103 103  CO2 22 27 26   GLUCOSE 99 113* 104*  BUN 15 20 28*  CREATININE 0.81 1.13 1.17  CALCIUM 9.0 9.0 8.9   GFR: Estimated Creatinine Clearance: 39.6 mL/min (by C-G formula based on SCr of 1.17 mg/dL). Liver Function Tests: No results for input(s): AST, ALT, ALKPHOS, BILITOT, PROT, ALBUMIN in the last 168 hours. No results for input(s): LIPASE, AMYLASE in the last 168 hours. No results for input(s): AMMONIA in the last 168 hours. Coagulation Profile: Recent Labs  Lab 09/09/19 2219 09/10/19 1601 09/11/19 0317 09/12/19 0237  INR 2.0* 2.1* 2.1* 1.8*   Cardiac Enzymes: No results for input(s): CKTOTAL, CKMB, CKMBINDEX, TROPONINI in the last 168 hours. BNP (last 3 results) No results for input(s): PROBNP in the last 8760 hours. HbA1C: Recent Labs    09/10/19 0621  HGBA1C 5.5   CBG: No results for input(s): GLUCAP in the last 168 hours. Lipid Profile: Recent Labs    09/10/19 0621  CHOL 133  HDL 41  LDLCALC 77  TRIG 73  CHOLHDL 3.2   Thyroid Function Tests: No results for input(s): TSH, T4TOTAL, FREET4, T3FREE, THYROIDAB in the last 72 hours. Anemia Panel: Recent Labs    09/10/19 0621  VITAMINB12 509   Urine  analysis: No results found for: COLORURINE, APPEARANCEUR, LABSPEC, PHURINE, GLUCOSEU, HGBUR, BILIRUBINUR, KETONESUR, PROTEINUR, UROBILINOGEN, NITRITE, LEUKOCYTESUR Sepsis Labs: @LABRCNTIP (procalcitonin:4,lacticidven:4)  ) Recent Results (from the past 240 hour(s))  Respiratory Panel by RT PCR (Flu A&B, Covid) - Nasopharyngeal Swab     Status: None   Collection Time: 09/09/19 10:50 PM   Specimen: Nasopharyngeal Swab  Result Value Ref Range Status   SARS Coronavirus 2 by RT PCR NEGATIVE NEGATIVE Final    Comment: (NOTE) SARS-CoV-2 target nucleic acids are NOT DETECTED. The SARS-CoV-2 RNA is generally detectable in upper respiratoy specimens during the acute phase of infection. The lowest concentration of SARS-CoV-2 viral copies this assay can detect is 131 copies/mL. A negative result does not preclude SARS-Cov-2 infection and should not be used as the sole basis for treatment or other patient management decisions. A negative result may occur with  improper specimen collection/handling, submission of specimen other than nasopharyngeal swab, presence of viral mutation(s) within the areas targeted by this assay, and inadequate number of viral copies (<131 copies/mL). A negative result must be combined with clinical observations, patient history, and epidemiological information.  The expected result is Negative. Fact Sheet for Patients:  PinkCheek.be Fact Sheet for Healthcare Providers:  GravelBags.it This test is not yet ap proved or cleared by the [place] FDA and  has been authorized for detection and/or diagnosis of SARS-CoV-2 by FDA under an Emergency Use Authorization (EUA). This EUA will remain  in effect (meaning this test can be used) for the duration of the COVID-19 declaration under Section 564(b)(1) of the Act, 21 U.S.C. section 360bbb-3(b)(1), unless the authorization is terminated or revoked sooner.     Influenza A by PCR NEGATIVE NEGATIVE Final   Influenza B by PCR NEGATIVE NEGATIVE Final    Comment: (NOTE) The Xpert Xpress SARS-CoV-2/FLU/RSV assay is intended as an aid in  the diagnosis of influenza from Nasopharyngeal swab specimens and  should not be used as a sole basis for treatment. Nasal washings and  aspirates are unacceptable for Xpert Xpress SARS-CoV-2/FLU/RSV  testing. Fact Sheet for Patients: PinkCheek.be Fact Sheet for Healthcare Providers: GravelBags.it This test is not yet approved or cleared by the [place] FDA and  has been authorized for detection and/or diagnosis of SARS-CoV-2 by  FDA under an Emergency Use Authorization (EUA). This EUA will remain  in effect (meaning this test can be used) for the duration of the  Covid-19 declaration under Section 564(b)(1) of the Act, 21  U.S.C. section 360bbb-3(b)(1), unless the authorization is  terminated or revoked. Performed at [facility], [place] [address], [place], [place] [address]       Studies: No results found.  Scheduled Meds: .  stroke: mapping our early stages of recovery book   Does not apply Once  . atorvastatin  40 mg Oral Daily  . levothyroxine  50 mcg Oral Q0600  . sodium chloride flush  3 mL Intravenous Q12H    Continuous Infusions: . sodium chloride       LOS: 2 days     Kayleen Memos, MD [facility] Pager [phone]  If 7PM-7AM, please contact night-coverage [email or web address] Password [facility] 09/12/2019, 10:47 AM

## 2019-09-12 NOTE — Progress Notes (Addendum)
[place] for warfarin Indication: atrial fibrillation  No Known Allergies  Patient Measurements: Height: 5\' 9"  (175.3 cm) Weight: 68 kg (150 lb) IBW/kg (Calculated) : 70.7   Vital Signs: Temp: 97.7 F (36.5 C) (05/12 1105) Temp Source: Oral (05/12 1105) BP: 142/76 (05/12 1105) Pulse Rate: 65 (05/12 1105)  Labs: Recent Labs    09/09/19 2219 09/10/19 0621 09/10/19 0621 09/10/19 1601 09/11/19 0317 09/12/19 0237  HGB  --  12.8*   < >  --  13.3 13.1  HCT  --  38.9*  --   --  40.7 40.5  PLT  --  127*  --   --  135* 124*  LABPROT   < >  --   --  23.1* 23.1* 20.2*  INR   < >  --   --  2.1* 2.1* 1.8*  CREATININE  --  0.81  --   --  1.13 1.17   < > = values in this interval not displayed.    Estimated Creatinine Clearance: 39.6 mL/min (by C-G formula based on SCr of 1.17 mg/dL).   Medical History: Past Medical History:  Diagnosis Date  . Hypertension   . Hypothyroidism   . Presence of permanent cardiac pacemaker      Assessment: 44 yoM with hx AFib on warfarin admitted with slurred speech and found to have CVA. Pharmacy to transition to [place] -CT head, MRI no acute bleeding.  -INR = 1.8 , H/H within normal. PLTC 124k stable - No bleeding reported. Neuro had wanted to switch to eliquis but copay too high $ 484.72-(also can't afford xarelto, pradaxa  per Neuro) > Neuro said change back to warfarin and bridge with Aspirin 325 mg daily until INR >2.   **PTA dose (per other chart) is 3mg  MF, 4mg  AODs, last dose 5/9 pta    Goal of Therapy:  INR 2-3 Monitor platelets by anticoagulation protocol: Yes   Plan:  -Warfarin 6 mg today -Daily PT/INR ASA 325 mg daily until INR >2  Nicole Cella, RPh Clinical Pharmacist [phone]  **Pharmacist phone directory can now be found on [email or web address] (PW [email or web address]).  Listed under [place]. 09/12/2019 2:05 PM

## 2019-09-12 NOTE — Progress Notes (Signed)
Physical Therapy Treatment Patient Details Name: [name] MRN: [id number] DOB: June 18, 1925 Today's Date: 09/12/2019    History of Present Illness 86 y.o. male with medical history significant for polymyalgia rheumatica, CKD, cardiomyopathy, hypertension, hypothyroidism, chronic systolic CHF, atrial fibrillation on warfarin, traumatic Rt frontal ICH in January 2021, and sick sinus syndrome with pacemaker, presented 09/09/19 to the emergency department with left-sided weakness, slurred speech, and ambulatory difficulty with falls. Initial head CT scan head showed chronic lacunar CVAs x2 basal ganglia, chronic small right cerebellar CVA and no acute changes and pt discharged home, where he again fell and returned to ED (same day). MRI right deep insula and radiating white matter infarct.     PT Comments    Patient seen in co-treatment with OT. Noted patient with decr strength LUE, left facial droop and decr intelligibility of speech. Per chart, his NIHSS score for LUE changed from a 1 to a 3 last night ~20:00. NIH at that time did not indicate a facial droop or decr speech. Gaspar Bidding, RN made aware and expressed concern for continued use of current diet with ?change in swallow function. RN agreed to assess patient and notify MD.   Follow Up Recommendations  SNF;Supervision/Assistance - 24 hour(at [facility])     Equipment Recommendations  None recommended by PT    Recommendations for Other Services       Precautions / Restrictions Precautions Precautions: Fall Precaution Comments: reports at least 3 falls in 6 months Restrictions Weight Bearing Restrictions: No    Mobility  Bed Mobility Overal bed mobility: Needs Assistance Bed Mobility: Supine to Sit     Supine to sit: Mod assist;+2 for physical assistance;HOB elevated     General bed mobility comments: initiates legs over side of bed; with cues reaches for railing with RUE; assist to raise torso and scoot to get feet on  the floor  Transfers Overall transfer level: Needs assistance Equipment used: Rolling walker (2 wheeled) Transfers: Sit to/from Stand Sit to Stand: Mod assist;+2 physical assistance;+2 safety/equipment         General transfer comment: pt required assist to initiate LLE extension and then to fully extend left knee; unable to grasp RW with L hand  Ambulation/Gait             General Gait Details: returned to bed due to decline in neuro status    Stairs             Wheelchair Mobility    Modified Rankin (Stroke Patients Only) Modified Rankin (Stroke Patients Only) Pre-Morbid Rankin Score: Slight disability Modified Rankin: Severe disability     Balance Overall balance assessment: Needs assistance;History of Falls Sitting-balance support: Feet supported;Single extremity supported Sitting balance-Leahy Scale: Poor Sitting balance - Comments: at EOB x ~15 minutes with varying assist (min to max assist--tends to lose balance to left or posteriorly) Postural control: Posterior lean;Left lateral lean Standing balance support: Bilateral upper extremity supported Standing balance-Leahy Scale: Poor Standing balance comment: forward flexed; RUE support on RW; left hand unable to grip RW; able to static stand with min assist                            Cognition Arousal/Alertness: Awake/alert Behavior During Therapy: WFL for tasks assessed/performed Overall Cognitive Status: Within Functional Limits for tasks assessed                 [place] Levels of Cognitive [place]  Amigos Scales of Cognitive Functioning: Purposeful/appropriate                      Exercises      General Comments        Pertinent Vitals/Pain Pain Assessment: No/denies pain    Home Living     Available Help at Discharge: Family;Available 24 hours/day Type of Home: Independent living facility              Prior Function            PT  Goals (current goals can now be found in the care plan section) Acute Rehab PT Goals Patient Stated Goal: to get stronger Time For Goal Achievement: 09/24/19 Potential to Achieve Goals: Good Progress towards PT goals: Not progressing toward goals - comment(decr strength left side compared to eval 5/10)    Frequency    Min 3X/week      PT Plan Current plan remains appropriate    Co-evaluation PT/OT/SLP Co-Evaluation/Treatment: Yes Reason for Co-Treatment: To address functional/ADL transfers PT goals addressed during session: Mobility/safety with mobility;Balance;Proper use of DME        AM-PAC PT "6 Clicks" Mobility   Outcome Measure  Help needed turning from your back to your side while in a flat bed without using bedrails?: A Lot Help needed moving from lying on your back to sitting on the side of a flat bed without using bedrails?: Total Help needed moving to and from a bed to a chair (including a wheelchair)?: A Lot Help needed standing up from a chair using your arms (e.g., wheelchair or bedside chair)?: A Lot Help needed to walk in hospital room?: Total Help needed climbing 3-5 steps with a railing? : Total 6 Click Score: 9    End of Session Equipment Utilized During Treatment: Gait belt Activity Tolerance: Patient tolerated treatment well Patient left: with call bell/phone within reach;in bed;with bed alarm set Nurse Communication: Mobility status;Other (comment)(decr UE strength, left facial droop, more dysarthric) PT Visit Diagnosis: Hemiplegia and hemiparesis;Difficulty in walking, not elsewhere classified (R26.2);History of falling (Z91.81) Hemiplegia - Right/Left: Left Hemiplegia - dominant/non-dominant: Non-dominant Hemiplegia - caused by: Unspecified     Time: 0927-1000 PT Time Calculation (min) (ACUTE ONLY): 33 min  Charges:  $Therapeutic Activity: 8-22 mins                      Arby Barrette, PT Pager [phone]    Rexanne Mano 09/12/2019, 12:46  PM

## 2019-09-13 ENCOUNTER — Telehealth: Payer: Self-pay | Admitting: Internal Medicine

## 2019-09-13 LAB — BASIC METABOLIC PANEL
Anion gap: 8 (ref 5–15)
BUN: 33 mg/dL — ABNORMAL HIGH (ref 8–23)
CO2: 26 mmol/L (ref 22–32)
Calcium: 8.8 mg/dL — ABNORMAL LOW (ref 8.9–10.3)
Chloride: 106 mmol/L (ref 98–111)
Creatinine, Ser: 1.26 mg/dL — ABNORMAL HIGH (ref 0.61–1.24)
GFR calc Af Amer: 57 mL/min — ABNORMAL LOW (ref >60)
GFR calc non Af Amer: 50 mL/min — ABNORMAL LOW (ref >60)
Glucose, Bld: 109 mg/dL — ABNORMAL HIGH (ref 70–99)
Potassium: 3.9 mmol/L (ref 3.5–5.1)
Sodium: 140 mmol/L (ref 135–145)

## 2019-09-13 LAB — PROTIME-INR
INR: 1.7 — ABNORMAL HIGH (ref 0.8–1.2)
INR: 1.9 — ABNORMAL HIGH (ref 0.8–1.2)
Prothrombin Time: 19 seconds — ABNORMAL HIGH (ref 11.4–15.2)
Prothrombin Time: 20.8 seconds — ABNORMAL HIGH (ref 11.4–15.2)

## 2019-09-13 LAB — CBC
HCT: 38.4 % — ABNORMAL LOW (ref 39.0–52.0)
Hemoglobin: 12.6 g/dL — ABNORMAL LOW (ref 13.0–17.0)
MCH: 30.8 pg (ref 26.0–34.0)
MCHC: 32.8 g/dL (ref 30.0–36.0)
MCV: 93.9 fL (ref 80.0–100.0)
Platelets: 128 10*3/uL — ABNORMAL LOW (ref 150–400)
RBC: 4.09 MIL/uL — ABNORMAL LOW (ref 4.22–5.81)
RDW: 13.5 % (ref 11.5–15.5)
WBC: 5.6 10*3/uL (ref 4.0–10.5)
nRBC: 0 % (ref 0.0–0.2)

## 2019-09-13 MED ORDER — WARFARIN SODIUM 7.5 MG PO TABS
7.5000 mg | ORAL_TABLET | Freq: Once | ORAL | Status: AC
  Administered 2019-09-13: 7.5 mg via ORAL
  Filled 2019-09-13: qty 1

## 2019-09-13 MED ORDER — ENOXAPARIN SODIUM 40 MG/0.4ML ~~LOC~~ SOLN
40.0000 mg | Freq: Once | SUBCUTANEOUS | Status: AC
  Administered 2019-09-13: 40 mg via SUBCUTANEOUS
  Filled 2019-09-13: qty 0.4

## 2019-09-13 MED ORDER — POLYETHYLENE GLYCOL 3350 17 G PO PACK
17.0000 g | PACK | Freq: Two times a day (BID) | ORAL | Status: DC
  Administered 2019-09-13 (×2): 17 g via ORAL
  Filled 2019-09-13 (×3): qty 1

## 2019-09-13 MED ORDER — SENNOSIDES-DOCUSATE SODIUM 8.6-50 MG PO TABS
2.0000 | ORAL_TABLET | Freq: Two times a day (BID) | ORAL | Status: DC
  Administered 2019-09-13 (×2): 2 via ORAL
  Filled 2019-09-13 (×3): qty 2

## 2019-09-13 MED ORDER — GLUCOSAMINE-CHONDROITIN 500-400 MG PO TABS: 1.0000 | ORAL_TABLET | Freq: Three times a day (TID) | ORAL | Status: DC

## 2019-09-13 MED ORDER — METOPROLOL SUCCINATE ER 25 MG PO TB24
12.5000 mg | ORAL_TABLET | Freq: Every day | ORAL | Status: DC
  Administered 2019-09-13: 12.5 mg via ORAL
  Filled 2019-09-13 (×2): qty 1

## 2019-09-13 MED ORDER — ADULT MULTIVITAMIN W/MINERALS CH
1.0000 | ORAL_TABLET | Freq: Every day | ORAL | Status: DC
  Administered 2019-09-13 – 2019-09-14 (×2): 1 via ORAL
  Filled 2019-09-13 (×4): qty 1

## 2019-09-13 MED ORDER — METOPROLOL SUCCINATE ER 50 MG PO TB24: 50.0000 mg | ORAL_TABLET | Freq: Every day | ORAL | Status: DC

## 2019-09-13 NOTE — Progress Notes (Signed)
  Speech Language Pathology Treatment: Dysphagia  Patient Details Name: [name] MRN: [id number] DOB: 1926-02-11 Today's Date: 09/13/2019 Time: [id number] SLP Time Calculation (min) (ACUTE ONLY): 20 min  Assessment / Plan / Recommendation Clinical Impression  [name] was seen for therapeutic diet tolerance/education on safe swallow strategies and ongoing risk of aspiration. Nurse tech was present for meal, reporting he did very well with breakfast with no coughing/choking. Pt was able to verbalize aspiration precautions discussed yesterday (cough intermittently, small bites/sips, nectar thick liquids by spoon, good oral care). He required set up assist, but then consumed chopped meat, mashed potatoes, and cobbler i'ly without difficulty. He initially reached to drink from cup, but then said "oh wait" and utilized spoon to drink nectar thick liquids. No coughing or throat clearing noted. Pt was reminded of strategies/risk and handouts were updated in room. He is pending discharge today, but is expected to benefit from further ST at next level of care (SNF) for ongoing oropharyngeal dysphagia.    HPI HPI: 86 yo male adm to [facility] with weakness, dysarthria and fall with concern for acute CVA.  Pt CT scan head showed chronic lacunar CVAs x2 basal ganglia, small right cerebellar CVA.  MRI showed insular CVA. Concern for worsening symptoms, so new CT has been ordered to r/o extension. CT was consistent with prior MRI--no new findings.      SLP Plan  Continue plan of care       Recommendations  Diet recommendations: Nectar-thick liquid;Dysphagia 2 (fine chop) Liquids provided via: Teaspoon Supervision: Patient able to self feed Compensations: Hard cough after swallow;Small sips/bites Postural Changes and/or Swallow Maneuvers: Seated upright 90 degrees            Oral Care Recommendations: Oral care BID Follow up Recommendations: Skilled Nursing facility SLP Visit Diagnosis: Dysarthria  and anarthria (R47.1);Dysphagia, oropharyngeal phase (R13.12) Plan: Dysphagia Treatment                     [name], M.S., [place] Pathologist Acute Rehabilitation Services Pager: [place] 09/13/2019, 1:13 PM

## 2019-09-13 NOTE — Progress Notes (Signed)
[place] for warfarin Indication: atrial fibrillation  No Known Allergies  Patient Measurements: Height: 5\' 9"  (175.3 cm) Weight: 68 kg (150 lb) IBW/kg (Calculated) : 70.7   Vital Signs: Temp: 98.7 F (37.1 C) (05/13 1130) Temp Source: Oral (05/13 1130) BP: 138/72 (05/13 1130) Pulse Rate: 65 (05/13 1130)  Labs: Recent Labs    09/11/19 0317 09/11/19 0317 09/12/19 0237 09/13/19 0454  HGB 13.3   < > 13.1 12.6*  HCT 40.7  --  40.5 38.4*  PLT 135*  --  124* 128*  LABPROT 23.1*  --  20.2* 19.0*  INR 2.1*  --  1.8* 1.7*  CREATININE 1.13  --  1.17 1.26*   < > = values in this interval not displayed.    Estimated Creatinine Clearance: 36.7 mL/min (A) (by C-G formula based on SCr of 1.26 mg/dL (H)).   Medical History: Past Medical History:  Diagnosis Date  . Hypertension   . Hypothyroidism   . Presence of permanent cardiac pacemaker      Assessment: 89 yoM with hx AFib on warfarin admitted with slurred speech and found to have CVA. Pharmacy to transition to [place] -CT head, MRI no acute bleeding.  -INR = 1.8 , H/H slight decrease 12.6/38.4. PLTC 124k stable - No bleeding reported. Neuro had wanted to switch to eliquis but copay too high $ 484.72-(also can't afford xarelto, pradaxa  per Neuro) > Neuro said change back to warfarin and bridge with Aspirin 325 mg daily until INR >2.  Dr. [place] Crane ordered pharmacy consult for Lovenox bridge. I have discussed this with Dr. [place] Crane.  Full dose lovenox bridge not usually recommended in patient with CVA. Dr. Leonie Man had recommended to give Aspirin bridge until INR > 2 instead of IV heparin bridge. Started Aspirin 325 mg daily yesterday 5/12.  Therefore Dr. [place] Crane wants to continue the aspirin bridge until INR >2 and give Lovenox 40mg  SQ x1 only today.    **PTA dose (per other chart) is 3mg  MF, 4mg  AODs, last dose 5/9 pta    Goal of Therapy:  INR 2-3 Monitor platelets by anticoagulation  protocol: Yes   Plan:  -Warfarin 7.5 mg today -Daily PT/INR ASA 325 mg daily until INR >2 Lovenox 40mg  sq x1 today.   Nicole Cella, RPh Clinical Pharmacist [phone]  **Pharmacist phone directory can now be found on [email or web address] (PW [email or web address]).  Listed under [place]. 09/13/2019 2:09 PM

## 2019-09-13 NOTE — Progress Notes (Signed)
PHARMACIST - PHYSICIAN ORDER COMMUNICATION  CONCERNING: P&T Medication Policy on Herbal Medications  DESCRIPTION:  This patient's order for:  Glucosamine/Chondoitan  has been noted.  This product(s) is classified as an "herbal" or natural product. Due to a lack of definitive safety studies or FDA approval, nonstandard manufacturing practices, plus the potential risk of unknown drug-drug interactions while on inpatient medications, the Pharmacy and Therapeutics Committee does not permit the use of "herbal" or natural products of this type within [facility].   ACTION TAKEN: The pharmacy department is unable to verify this order at this time.  Please reevaluate patient's clinical condition at discharge and address if the herbal or natural product(s) should be resumed at that time.  Alanda Slim, PharmD, [facility] Clinical Pharmacist Please see AMION for all Pharmacists' Contact Phone Numbers 09/13/2019, 1:56 PM

## 2019-09-13 NOTE — TOC Progression Note (Addendum)
Transition of Care [facility] [address]) - Progression Note    Patient Details  Name: [name] MRN: [id number] Date of Birth: Oct 05, 1925  Transition of Care [facility] CM/SW [place], [place] Phone Number: 09/13/2019, 10:54 AM  Clinical Narrative:    Update CSW spoke with [facility] [phone] and confirmed patient is able to discharge to the SNF side of the facility once medically stable.  CSW spoke with patient's son and proivded an update. CSW reached out to patient's spouse, awaiting a call back.   CSW contacted [facility] [phone] ext [phone] to inquire on discharge needs and left a message with director Volney Presser, awaiting a call back.  Expected Discharge Plan: [place] Barriers to Discharge: Continued Medical Work up  Expected Discharge Plan and Services Expected Discharge Plan: [place] In-house Referral: Clinical Social Work Discharge Planning Services: CM Consult Post Acute Care Choice: [place] Living arrangements for the past 2 months: Apartment                                       Social Determinants of Health (SDOH) Interventions    Readmission Risk Interventions No flowsheet data found.

## 2019-09-13 NOTE — Telephone Encounter (Signed)
Patient's son calling stating the patient had a stroke and was admitted to the hospital 09/09/2019. He states the patient is still there and wanted to inform Dr. Caryl Comes. He also states the patient and his wife can be reached at [phone].

## 2019-09-13 NOTE — Progress Notes (Addendum)
PROGRESS NOTE  [name] [id number] DOB: 08-May-1925 DOA: 09/09/2019 PCP: [name], MD  HPI/Recap of past 24 hours: Past medical history of HTN, hypothyroidism, chronic diastolic CHF, permanent A. fib on Coumadin, traumatic ICH in January 2021, PPM implant.  Patient presented with left-sided weakness found to have acute stroke on MRI. Currently further plan is arranged rehab and complete stroke work-up.  09/13/19: Seen and examined.  Persistent left upper extremity weakness.  Subtherapeutic INR 1.7.  Pharmacy consulted to bridge Lovenox to achieve therapeutic level INR between 2 and 3.   Assessment/Plan: Principal Problem:   Acute left-sided weakness Active Problems:   AF (paroxysmal atrial fibrillation) (HCC)   Hypothyroidism   Essential hypertension   Chronic systolic CHF (congestive heart failure) (HCC)   SSS (sick sinus syndrome) (HCC)   Cerebral embolism with cerebral infarction   CVA (cerebral vascular accident) ([place])   Right MCA infarct most likely embolic secondary to permanent A. fib on warfarin CT head no acute abnormality.  Small vessel disease.  Atrophy.  Old scattered lacunar MRI right deep insula and radiating white matter infarct.  Small vessel disease.  Atrophy.  Old right frontal white matter infarct.  2D echo showed LVEF 60 to 65% no thrombus, no PFO LDL 77 with goal of less than 70 A1c 5.5 with goal of less than 7.0. CTA head and neck no large vessel obstruction. L ICA bifurcation 50% stenosis.  Moderate multifocal intracranial atherosclerosis. Appears to have failed warfarin, neurology recommended DOAC but minimal insurance coverage. INR 1.7, goal between 2 and 3. Pharmacy consult to bridge Coumadin with Lovenox to achieve therapeutic INR Repeated CT head done on 09/12/2019 showed no evidence of intracranial bleed Continue neurochecks every 4 hours. Continue PT OT speech eval Continue Lipitor 40 mg daily Continue to closely monitor on  telemetry  Subtherapeutic INR INR 1.7 Management as stated above  Permanent A. Fib Rate is currently controlled Resume Toprol-XL at lower dose 12.5 mg daily Continue Coumadin Continue to monitor vital signs  Dysphagia in the setting of acute CVA Speech therapist following Continue to follow speech therapy recommendations Continue aspiration precautions  Constipation Reports no bowel movement in 5 days Started bowel regimen with Senokot 2 tablets twice daily and MiraLAX 17 g twice daily Can consider smog enema if unsuccessful  Essential hypertension Gradually normalizing blood pressure Toprol-XL resumed Continue to monitor vital signs  Hyperlipidemia LDL 77, goal of 70 Continue Lipitor 40 mg daily  Polymyalgia rheumatica No acute issues Not on chronic steroid use  Hypothyroidism TSH 3.939 on 09/12/2019 Continue Synthroid  History of traumatic intracranial hemorrhage post fall in January 2021 No evidence of intracranial bleed on CT scan   Disposition:  Status is: Inpatient  Remains inpatient appropriate because: See above.  Unsafe discharge plan.  Dispo: The patient is from: ILF  Anticipated d/c is to: SNF  Anticipated d/c date is: 09/14/2019  Patient currently is not medically stable to d/c due to subtherapeutic INR, failed Coumadin previously.     Objective: Vitals:   09/12/19 2335 09/13/19 0351 09/13/19 0746 09/13/19 1130  BP: (!) 145/62 (!) 147/71 (!) 163/68 138/72  Pulse: 63 70 63 65  Resp: 18 18 18 18   Temp: 98.1 F (36.7 C) 98.1 F (36.7 C) 98.2 F (36.8 C) 98.7 F (37.1 C)  TempSrc: Oral Oral Oral Oral  SpO2: 97% 98% 95% 95%  Weight:      Height:        Intake/Output Summary (Last 24  hours) at 09/13/2019 1341 Last data filed at 09/13/2019 0900 Gross per 24 hour  Intake 240 ml  Output 700 ml  Net -460 ml   Filed Weights   09/09/19 2123  Weight: 68 kg    Exam:  . General: 86 y.o. year-old male  well-developed well-nourished no acute distress.  Alert oriented x3.  . Cardiovascular: Irregular rate and rhythm no rubs or gallops.   Marland Kitchen Respiratory: Clear to auscultation no wheezes or rales.   . Abdomen: Soft nontender nondistended normal bowel sounds present.   . Musculoskeletal: No lower extremity edema bilaterally.  Left upper extremity 3 out of 5 strength.  . Psychiatry: Mood is appropriate for condition and setting.  Data Reviewed: CBC: Recent Labs  Lab 09/10/19 0621 09/11/19 0317 09/12/19 0237 09/13/19 0454  WBC 6.2 7.1 6.2 5.6  HGB 12.8* 13.3 13.1 12.6*  HCT 38.9* 40.7 40.5 38.4*  MCV 92.6 92.9 93.3 93.9  PLT 127* 135* 124* 0000000*   Basic Metabolic Panel: Recent Labs  Lab 09/10/19 0621 09/11/19 0317 09/12/19 0237 09/13/19 0454  NA 138 139 140 140  K 4.3 3.6 4.2 3.9  CL 106 103 103 106  CO2 22 27 26 26   GLUCOSE 99 113* 104* 109*  BUN 15 20 28* 33*  CREATININE 0.81 1.13 1.17 1.26*  CALCIUM 9.0 9.0 8.9 8.8*   GFR: Estimated Creatinine Clearance: 36.7 mL/min (A) (by C-G formula based on SCr of 1.26 mg/dL (H)). Liver Function Tests: No results for input(s): AST, ALT, ALKPHOS, BILITOT, PROT, ALBUMIN in the last 168 hours. No results for input(s): LIPASE, AMYLASE in the last 168 hours. No results for input(s): AMMONIA in the last 168 hours. Coagulation Profile: Recent Labs  Lab 09/09/19 2219 09/10/19 1601 09/11/19 0317 09/12/19 0237 09/13/19 0454  INR 2.0* 2.1* 2.1* 1.8* 1.7*   Cardiac Enzymes: No results for input(s): CKTOTAL, CKMB, CKMBINDEX, TROPONINI in the last 168 hours. BNP (last 3 results) No results for input(s): PROBNP in the last 8760 hours. HbA1C: No results for input(s): HGBA1C in the last 72 hours. CBG: No results for input(s): GLUCAP in the last 168 hours. Lipid Profile: No results for input(s): CHOL, HDL, LDLCALC, TRIG, CHOLHDL, LDLDIRECT in the last 72 hours. Thyroid Function Tests: Recent Labs    09/12/19 1326  TSH 3.939   Anemia  Panel: No results for input(s): VITAMINB12, FOLATE, FERRITIN, TIBC, IRON, RETICCTPCT in the last 72 hours. Urine analysis: No results found for: COLORURINE, APPEARANCEUR, LABSPEC, PHURINE, GLUCOSEU, HGBUR, BILIRUBINUR, KETONESUR, PROTEINUR, UROBILINOGEN, NITRITE, LEUKOCYTESUR Sepsis Labs: @LABRCNTIP (procalcitonin:4,lacticidven:4)  ) Recent Results (from the past 240 hour(s))  Respiratory Panel by RT PCR (Flu A&B, Covid) - Nasopharyngeal Swab     Status: None   Collection Time: 09/09/19 10:50 PM   Specimen: Nasopharyngeal Swab  Result Value Ref Range Status   SARS Coronavirus 2 by RT PCR NEGATIVE NEGATIVE Final    Comment: (NOTE) SARS-CoV-2 target nucleic acids are NOT DETECTED. The SARS-CoV-2 RNA is generally detectable in upper respiratoy specimens during the acute phase of infection. The lowest concentration of SARS-CoV-2 viral copies this assay can detect is 131 copies/mL. A negative result does not preclude SARS-Cov-2 infection and should not be used as the sole basis for treatment or other patient management decisions. A negative result may occur with  improper specimen collection/handling, submission of specimen other than nasopharyngeal swab, presence of viral mutation(s) within the areas targeted by this assay, and inadequate number of viral copies (<131 copies/mL). A negative result must be combined  with clinical observations, patient history, and epidemiological information. The expected result is Negative. Fact Sheet for Patients:  PinkCheek.be Fact Sheet for Healthcare Providers:  GravelBags.it This test is not yet ap proved or cleared by the [place] FDA and  has been authorized for detection and/or diagnosis of SARS-CoV-2 by FDA under an Emergency Use Authorization (EUA). This EUA will remain  in effect (meaning this test can be used) for the duration of the COVID-19 declaration under Section 564(b)(1) of  the Act, 21 U.S.C. section 360bbb-3(b)(1), unless the authorization is terminated or revoked sooner.    Influenza A by PCR NEGATIVE NEGATIVE Final   Influenza B by PCR NEGATIVE NEGATIVE Final    Comment: (NOTE) The Xpert Xpress SARS-CoV-2/FLU/RSV assay is intended as an aid in  the diagnosis of influenza from Nasopharyngeal swab specimens and  should not be used as a sole basis for treatment. Nasal washings and  aspirates are unacceptable for Xpert Xpress SARS-CoV-2/FLU/RSV  testing. Fact Sheet for Patients: PinkCheek.be Fact Sheet for Healthcare Providers: GravelBags.it This test is not yet approved or cleared by the [place] FDA and  has been authorized for detection and/or diagnosis of SARS-CoV-2 by  FDA under an Emergency Use Authorization (EUA). This EUA will remain  in effect (meaning this test can be used) for the duration of the  Covid-19 declaration under Section 564(b)(1) of the Act, 21  U.S.C. section 360bbb-3(b)(1), unless the authorization is  terminated or revoked. Performed at [facility], [place] [address] [place], [place] [address]   SARS CORONAVIRUS 2 (TAT 6-24 HRS) Nasopharyngeal Nasopharyngeal Swab     Status: None   Collection Time: 09/12/19 10:51 AM   Specimen: Nasopharyngeal Swab  Result Value Ref Range Status   SARS Coronavirus 2 NEGATIVE NEGATIVE Final    Comment: (NOTE) SARS-CoV-2 target nucleic acids are NOT DETECTED. The SARS-CoV-2 RNA is generally detectable in upper and lower respiratory specimens during the acute phase of infection. Negative results do not preclude SARS-CoV-2 infection, do not rule out co-infections with other pathogens, and should not be used as the sole basis for treatment or other patient management decisions. Negative results must be combined with clinical observations, patient history, and epidemiological information. The expected result is Negative. Fact  Sheet for Patients: SugarRoll.be Fact Sheet for Healthcare Providers: https://www.woods-mathews.com/ This test is not yet approved or cleared by the [place] FDA and  has been authorized for detection and/or diagnosis of SARS-CoV-2 by FDA under an Emergency Use Authorization (EUA). This EUA will remain  in effect (meaning this test can be used) for the duration of the COVID-19 declaration under Section 56 4(b)(1) of the Act, 21 U.S.C. section 360bbb-3(b)(1), unless the authorization is terminated or revoked sooner. Performed at [facility], [place] [address], [place], [place] [address]       Studies: No results found.  Scheduled Meds: .  stroke: mapping our early stages of recovery book   Does not apply Once  . aspirin  325 mg Oral Daily  . atorvastatin  40 mg Oral Daily  . levothyroxine  50 mcg Oral Q0600  . polyethylene glycol  17 g Oral BID  . senna-docusate  2 tablet Oral BID  . sodium chloride flush  3 mL Intravenous Q12H  . Warfarin - Pharmacist Dosing Inpatient   Does not apply q1600    Continuous Infusions: . sodium chloride       LOS: 3 days     Kayleen Memos, MD  [facility] Pager [phone]  If 7PM-7AM, please contact night-coverage [email or web address] Password [facility] 09/13/2019, 1:41 PM

## 2019-09-13 NOTE — TOC Transition Note (Deleted)
Transition of Care [facility]) - CM/SW Discharge Note   Patient Details  Name: [name] MRN: [id number] Date of Birth: 12-30-1925  [facility] CM/SW Contact:  Jacquelynn Cree Phone Number: 09/13/2019, 12:27 PM   Clinical Narrative:    Patient will DC to: [facility] Anticipated DC date: 5/13 Family notified: Spouse, bedside Transport by: Corey Harold   Per MD patient ready for DC to [facility]. RN, patient, patient's family, and facility notified of DC. Discharge Summary and FL2 sent to facility. RN to call report prior to discharge [phone] Room 32). DC packet on chart. Ambulance transport requested for patient.   CSW will sign off for now as social work intervention is no longer needed. Please consult [place] again if new needs arise.    Final next level of care: Skilled Nursing Facility Barriers to Discharge: No Barriers Identified   Patient Goals and CMS Choice   CMS Medicare.gov Compare Post Acute Care list provided to:: Patient Choice offered to / list presented to : Patient, Spouse  Discharge Placement              Patient chooses bed at: [facility] Patient to be transferred to facility by: [place] Name of family member notified: Riley Churches Patient and family notified of of transfer: 09/13/19  Discharge Plan and Services In-house Referral: Clinical Social Work Discharge Planning Services: CM Consult Post Acute Care Choice: [place]                               Social Determinants of Health (SDOH) Interventions     Readmission Risk Interventions No flowsheet data found.

## 2019-09-14 ENCOUNTER — Non-Acute Institutional Stay (SKILLED_NURSING_FACILITY): Payer: Medicare Other | Admitting: Nurse Practitioner

## 2019-09-14 ENCOUNTER — Encounter: Payer: Self-pay | Admitting: Nurse Practitioner

## 2019-09-14 DIAGNOSIS — I639 Cerebral infarction, unspecified: Secondary | ICD-10-CM

## 2019-09-14 DIAGNOSIS — M353 Polymyalgia rheumatica: Secondary | ICD-10-CM

## 2019-09-14 DIAGNOSIS — I4891 Unspecified atrial fibrillation: Secondary | ICD-10-CM | POA: Diagnosis not present

## 2019-09-14 DIAGNOSIS — I10 Essential (primary) hypertension: Secondary | ICD-10-CM | POA: Diagnosis not present

## 2019-09-14 DIAGNOSIS — I495 Sick sinus syndrome: Secondary | ICD-10-CM | POA: Diagnosis not present

## 2019-09-14 DIAGNOSIS — I5021 Acute systolic (congestive) heart failure: Secondary | ICD-10-CM

## 2019-09-14 DIAGNOSIS — R531 Weakness: Secondary | ICD-10-CM | POA: Insufficient documentation

## 2019-09-14 DIAGNOSIS — R1312 Dysphagia, oropharyngeal phase: Secondary | ICD-10-CM | POA: Diagnosis not present

## 2019-09-14 DIAGNOSIS — K5901 Slow transit constipation: Secondary | ICD-10-CM

## 2019-09-14 DIAGNOSIS — N401 Enlarged prostate with lower urinary tract symptoms: Secondary | ICD-10-CM | POA: Diagnosis not present

## 2019-09-14 DIAGNOSIS — E039 Hypothyroidism, unspecified: Secondary | ICD-10-CM

## 2019-09-14 DIAGNOSIS — D751 Secondary polycythemia: Secondary | ICD-10-CM | POA: Diagnosis not present

## 2019-09-14 DIAGNOSIS — E785 Hyperlipidemia, unspecified: Secondary | ICD-10-CM

## 2019-09-14 LAB — CBC
HCT: 39.3 % (ref 39.0–52.0)
Hemoglobin: 12.9 g/dL — ABNORMAL LOW (ref 13.0–17.0)
MCH: 30.9 pg (ref 26.0–34.0)
MCHC: 32.8 g/dL (ref 30.0–36.0)
MCV: 94 fL (ref 80.0–100.0)
Platelets: 133 10*3/uL — ABNORMAL LOW (ref 150–400)
RBC: 4.18 MIL/uL — ABNORMAL LOW (ref 4.22–5.81)
RDW: 13.4 % (ref 11.5–15.5)
WBC: 5.4 10*3/uL (ref 4.0–10.5)
nRBC: 0 % (ref 0.0–0.2)

## 2019-09-14 LAB — PROTIME-INR
INR: 2.2 — ABNORMAL HIGH (ref 0.8–1.2)
Prothrombin Time: 23.3 seconds — ABNORMAL HIGH (ref 11.4–15.2)

## 2019-09-14 LAB — BASIC METABOLIC PANEL
Anion gap: 9 (ref 5–15)
BUN: 33 mg/dL — ABNORMAL HIGH (ref 8–23)
CO2: 25 mmol/L (ref 22–32)
Calcium: 8.9 mg/dL (ref 8.9–10.3)
Chloride: 107 mmol/L (ref 98–111)
Creatinine, Ser: 1.14 mg/dL (ref 0.61–1.24)
GFR calc Af Amer: >60 mL/min (ref >60)
GFR calc non Af Amer: 56 mL/min — ABNORMAL LOW (ref >60)
Glucose, Bld: 118 mg/dL — ABNORMAL HIGH (ref 70–99)
Potassium: 4.2 mmol/L (ref 3.5–5.1)
Sodium: 141 mmol/L (ref 135–145)

## 2019-09-14 MED ORDER — STROKE: EARLY STAGES OF RECOVERY BOOK: 1.0000 | Freq: Once | 0 refills | Status: AC

## 2019-09-14 MED ORDER — METOPROLOL SUCCINATE ER 25 MG PO TB24
12.5000 mg | ORAL_TABLET | Freq: Every day | ORAL | Status: DC
  Administered 2019-09-14: 12.5 mg via ORAL

## 2019-09-14 MED ORDER — METOPROLOL SUCCINATE ER 25 MG PO TB24: 12.5000 mg | ORAL_TABLET | Freq: Every day | ORAL | 0 refills | Status: DC

## 2019-09-14 MED ORDER — ADULT MULTIVITAMIN W/MINERALS CH: 1.0000 | ORAL_TABLET | Freq: Every day | ORAL | 0 refills | Status: DC

## 2019-09-14 MED ORDER — WARFARIN SODIUM 5 MG PO TABS: 5.0000 mg | ORAL_TABLET | Freq: Once | ORAL | Status: DC

## 2019-09-14 MED ORDER — ATORVASTATIN CALCIUM 40 MG PO TABS: 40.0000 mg | ORAL_TABLET | Freq: Every day | ORAL | 0 refills | Status: DC

## 2019-09-14 MED ORDER — RESOURCE THICKENUP CLEAR PO POWD: 30.0000 g | ORAL | 0 refills | Status: DC | PRN

## 2019-09-14 NOTE — Assessment & Plan Note (Signed)
TSH wnl, continue Levothyroxine.

## 2019-09-14 NOTE — Progress Notes (Signed)
Location:    [place] Room Number: 39 Place of Service:  SNF (31) [name]:  Mast, Lennie Odor NP   [name], MD  Patient Care Team: [name], MD as PCP - General (Family Medicine) [name], MD (Ophthalmology) [name], MD (Orthopedic Surgery) [name], MD (Cardiology) [name], MD (General Surgery) [name], MD (Family Medicine)  Extended Emergency Contact Information Primary Emergency Contact: Mccoy,Gabriel Address: [address] [place], [place] [address] [name] Phone: [phone] Mobile Phone: [phone] Relation: Spouse Secondary Emergency Contact: Mccoy, Gabriel Home Phone: [phone] Relation: Spouse  Code Status:  Full Code Goals of care: Advanced Directive information Advanced Directives 09/14/2019  Does Patient Have a Medical Advance Directive? Yes  Type of Advance Directive Living will;Healthcare Power of Attorney  Does patient want to make changes to medical advance directive? No - Patient declined  Copy of [place] in Chart? No - copy requested  Would patient like information on creating a medical advance directive? -     Chief Complaint  Patient presents with  . Acute Visit    Medication review    HPI:  Pt is a 86 y.o. male seen today for an acute visit for the patient was hospitalized 5/9 -5/14 for left sided weakness, acute CVA, admitted to [facility] for therapy.     Hx of AFib, heart rate is in control, on Metoprolol 12.5mg  qd, Coumadin, s/p  pacemaker, hyperlipidemia, on Atorvastatin, LDL 91 10/2018,  hypothyroidism, on Levothyroxine 69mcg qd, TSH 3.235. Hx of CHF, off Furosemide. HTN, blood pressure is controlled.    Past Medical History:  Diagnosis Date  . AF (atrial fibrillation) [facility])    a. s/p RFCA/PVI @ [facility] 2007.  . Arthritis    "joints" (06/21/2013)  . Atrial flutter ([place])    a. 06/2013 s/p  TEE/DCCV  . Chronic systolic CHF (congestive heart failure) ([place])    a. 06/2013 Echo: EF 20%, diff HK, mild LVH.  Marland Kitchen History of cardiovascular stress test    Lexiscan Myoview 8/16:  EF 59%, attenuation artifact, no ischemia; Low Risk  . Hyperlipidemia   . Hypertension   . HYPERTENSION, BENIGN 08/21/2009   Qualifier: Diagnosis of  By: Caryl Comes, MD, Remus Blake  Medications(s)  enalapril   . Hypothyroidism   . Neoplasm of uncertain behavior    SKIN  . Polymyalgia rheumatica ([place]) 01/13/2007   Qualifier: History of  By: Danny Lawless CMA, [place]     . Presence of permanent cardiac pacemaker    Past Surgical History:  Procedure Laterality Date  . ATRIAL FIBRILLATION ABLATION  2007  . CARDIOVERSION N/A 06/22/2013   Procedure: CARDIOVERSION;  Surgeon: Sueanne Margarita, MD;  Location: [facility] ENDOSCOPY;  Service: Cardiovascular;  Laterality: N/A;  15:17 Lido 20mg ,IV , Propofol 30 mg, IV synched cardioversion @ 150 joules by Dr. Lajoyce Lauber from a flutter to sinis brady which is baseline for this pt, reports 45-50 reg HR verified by Dr. Ashok Norris  . CARDIOVERSION N/A 11/21/2014   Procedure: CARDIOVERSION;  Surgeon: Fay Records, MD;  Location: [facility] ENDOSCOPY;  Service: Cardiovascular;  Laterality: N/A;  . CARDIOVERSION N/A 07/14/2015   Procedure: CARDIOVERSION;  Surgeon: Sueanne Margarita, MD;  Location: [place];  Service: Cardiovascular;  Laterality: N/A;  . CATARACT EXTRACTION, BILATERAL Bilateral Summer 2009  . CHOLECYSTECTOMY    . EP IMPLANTABLE DEVICE N/A 11/10/2015  Procedure: Pacemaker Implant;  Surgeon: [name], MD;  Location: [place] CV LAB;  Service: Cardiovascular;  Laterality: N/A;  . HAND SURGERY Right    "broke bone; grafted area w/bone from somewhere else"  . HEMORRHOID SURGERY    . INGUINAL HERNIA REPAIR Bilateral   . TEE WITHOUT CARDIOVERSION N/A 06/22/2013   Procedure: TRANSESOPHAGEAL ECHOCARDIOGRAM (TEE);  Surgeon: Sueanne Margarita, MD;  Location: [facility] ENDOSCOPY;  Service:  Cardiovascular;  Laterality: N/A;  . TEE WITHOUT CARDIOVERSION N/A 07/14/2015   Procedure: TRANSESOPHAGEAL ECHOCARDIOGRAM (TEE);  Surgeon: Sueanne Margarita, MD;  Location: [facility] ENDOSCOPY;  Service: Cardiovascular;  Laterality: N/A;  . TONSILLECTOMY      No Known Allergies  Allergies as of 09/14/2019   No Known Allergies     Medication List       Accurate as of Sep 14, 2019 11:59 PM. If you have any questions, ask your nurse or doctor.        STOP taking these medications   amLODipine 5 MG tablet Commonly known as: NORVASC   b complex vitamins tablet   furosemide 20 MG tablet Commonly known as: LASIX   Refresh Optive Advanced 0.5-1-0.5 % Soln Generic drug: Carboxymeth-Glycerin-Polysorb     TAKE these medications    stroke: mapping our early stages of recovery book Misc 1 each by Does not apply route once for 1 dose.   atorvastatin 40 MG tablet Commonly known as: LIPITOR Take 40 mg by mouth daily. What changed: Another medication with the same name was removed. Continue taking this medication, and follow the directions you see here.   atorvastatin 40 MG tablet Commonly known as: LIPITOR Take 1 tablet (40 mg total) by mouth daily. What changed: Another medication with the same name was removed. Continue taking this medication, and follow the directions you see here.   Glucosamine-Chondroitin 500-400 MG Caps Take 1 capsule by mouth in the morning, at noon, and at bedtime.   glucosamine-chondroitin 500-400 MG tablet Take 1 tablet by mouth 3 (three) times daily.   levothyroxine 50 MCG tablet Commonly known as: SYNTHROID Take 1 tablet (50 mcg total) by mouth daily before breakfast.   levothyroxine 50 MCG tablet Commonly known as: SYNTHROID Take 50 mcg by mouth daily.   metoprolol succinate 25 MG 24 hr tablet Commonly known as: TOPROL-XL Take 12.5 mg by mouth daily. What changed: Another medication with the same name was removed. Continue taking this medication, and  follow the directions you see here.   metoprolol succinate 25 MG 24 hr tablet Commonly known as: TOPROL-XL Take 0.5 tablets (12.5 mg total) by mouth daily. What changed: Another medication with the same name was removed. Continue taking this medication, and follow the directions you see here.   multivitamin with minerals Tabs tablet Take 1 tablet by mouth daily. Centrum   multivitamin with minerals Tabs tablet Take 1 tablet by mouth daily.   Resource ThickenUp Clear Powd Take 30 g by mouth as needed.   warfarin 2 MG tablet Commonly known as: COUMADIN Take as directed by the anticoagulation clinic. If you are unsure how to take this medication, talk to your nurse or doctor. Original instructions: Take 3 mg by mouth. Take 1 and 1/2 tablets on Mondays and Fridays. What changed: Another medication with the same name was removed. Continue taking this medication, and follow the directions you see here.   warfarin 2 MG tablet Commonly known as: COUMADIN Take as directed by the anticoagulation clinic. If you are unsure  how to take this medication, talk to your nurse or doctor. Original instructions: Take 4 mg by mouth. 2 tablets on Tues, Wed, Thurs, Sat, and Sun. What changed: Another medication with the same name was removed. Continue taking this medication, and follow the directions you see here.   warfarin 2 MG tablet Commonly known as: COUMADIN Take as directed by the anticoagulation clinic. If you are unsure how to take this medication, talk to your nurse or doctor. Original instructions: Take 2 mg by mouth See admin instructions. Take as directed by Coumadin Clinic - Per patient, he takes 1 and 1/2 tablets on mondays + fridays, 2 tablets on all other days. (Verified) What changed: Another medication with the same name was removed. Continue taking this medication, and follow the directions you see here.       Review of Systems  Constitutional: Positive for fatigue. Negative for  activity change, appetite change, chills, diaphoresis and fever.  HENT: Positive for hearing loss. Negative for congestion and voice change.   Eyes: Negative for visual disturbance.  Respiratory: Negative for cough, shortness of breath and wheezing.   Cardiovascular: Negative for chest pain, palpitations and leg swelling.  Gastrointestinal: Positive for constipation. Negative for abdominal distention, abdominal pain, diarrhea, nausea and vomiting.  Genitourinary: Positive for frequency. Negative for difficulty urinating, dysuria and urgency.  Musculoskeletal: Positive for gait problem. Negative for arthralgias and back pain.  Skin: Negative for color change and pallor.  Neurological: Positive for facial asymmetry and weakness. Negative for dizziness, speech difficulty and headaches.  Psychiatric/Behavioral: Positive for sleep disturbance. Negative for agitation, behavioral problems and hallucinations. The patient is not nervous/anxious.     Immunization History  Administered Date(s) Administered  . Influenza Split 01/25/2012  . Influenza Whole 02/08/2008, 03/12/2009, 01/16/2010  . Influenza, High Dose Seasonal PF 01/24/2015, 03/04/2017, 02/16/2018, 12/29/2018  . Influenza,inj,Quad PF,6+ Mos 01/25/2013, 01/23/2014  . Influenza-Unspecified 01/02/2016  . Pneumococcal Conjugate-13 01/25/2013  . Pneumococcal Polysaccharide-23 05/28/2016  . Td 01/16/2010  . Tdap 05/06/2019   Pertinent  Health Maintenance Due  Topic Date Due  . INFLUENZA VACCINE  12/02/2019  . PNA vac Low Risk Adult  Completed   Fall Risk  03/23/2019 03/17/2018 02/16/2018 05/28/2016 04/08/2016  Falls in the past year? 0 1 No No No  Comment [facility] Telephone Survey: data to providers prior to load [employer] Telephone Survey: data to providers prior to load - - [facility] Telephone Survey: data to providers prior to load  Number falls in past yr: - 1 - - -  Comment - [facility] Telephone Survey Actual Response = 2 - - -  Injury with Fall? - 1 -  - -   Functional Status Survey:    Vitals:   09/14/19 1433  BP: (!) 154/90  Pulse: 70  Resp: 16  Temp: 97.6 F (36.4 C)  SpO2: 94%  Weight: 150 lb (68 kg)  Height: 5\' 9"  (1.753 m)   Body mass index is 22.15 kg/m. Physical Exam Vitals and nursing note reviewed.  Constitutional:      General: He is not in acute distress.    Appearance: Normal appearance. He is not ill-appearing, toxic-appearing or diaphoretic.  HENT:     Head: Normocephalic and atraumatic.     Nose: Nose normal.     Mouth/Throat:     Mouth: Mucous membranes are moist.  Eyes:     Extraocular Movements: Extraocular movements intact.     Conjunctiva/sclera: Conjunctivae normal.     Pupils: Pupils are equal, round,  and reactive to light.  Cardiovascular:     Rate and Rhythm: Normal rate and regular rhythm.     Heart sounds: No murmur.     Comments: Left chest pace maker Pulmonary:     Effort: Pulmonary effort is normal.     Breath sounds: No wheezing, rhonchi or rales.  Abdominal:     General: Bowel sounds are normal. There is no distension.     Palpations: Abdomen is soft.     Tenderness: There is no abdominal tenderness. There is no right CVA tenderness, left CVA tenderness, guarding or rebound.  Musculoskeletal:     Cervical back: Normal range of motion and neck supple.     Right lower leg: No edema.     Left lower leg: No edema.  Skin:    General: Skin is warm and dry.  Neurological:     General: No focal deficit present.     Mental Status: He is alert and oriented to person, place, and time. Mental status is at baseline.     Cranial Nerves: Cranial nerve deficit present.     Motor: Weakness present.     Coordination: Coordination abnormal.     Gait: Gait abnormal.  Psychiatric:        Mood and Affect: Mood normal.        Behavior: Behavior normal.        Thought Content: Thought content normal.        Judgment: Judgment normal.     Labs reviewed: Recent Labs    09/09/19 1138 09/10/19  0621 09/12/19 0237 09/13/19 0454 09/14/19 0301  NA 141   < > 140 140 141  K 4.5   < > 4.2 3.9 4.2  CL 104   < > 103 106 107  CO2 28   < > 26 26 25   GLUCOSE 104*   < > 104* 109* 118*  BUN 16   < > 28* 33* 33*  CREATININE 0.98   < > 1.17 1.26* 1.14  CALCIUM 9.3   < > 8.9 8.8* 8.9  MG 2.0  --   --   --   --    < > = values in this interval not displayed.   Recent Labs    10/04/18 0834 05/07/19 0512 09/09/19 1138  AST 16 31 21   ALT 5 8 10   ALKPHOS 76 59 72  BILITOT 0.8 1.3* 1.0  PROT 6.9 6.0* 6.6  ALBUMIN 4.3 3.4* 3.8   Recent Labs    10/04/18 0834 10/04/18 0834 05/06/19 1320 05/07/19 0512 09/09/19 1138 09/10/19 0621 09/12/19 0237 09/13/19 0454 09/14/19 0301  WBC 5.1   < > 7.8   < > 4.2   < > 6.2 5.6 5.4  NEUTROABS 3.4  --  6.5  --  3.0  --   --   --   --   HGB 13.6   < > 13.6   < > 13.4   < > 13.1 12.6* 12.9*  HCT 40.1   < > 42.6   < > 42.2   < > 40.5 38.4* 39.3  MCV 91.9   < > 96.4   < > 95.3   < > 93.3 93.9 94.0  PLT 157.0   < > 155   < > 134*   < > 124* 128* 133*   < > = values in this interval not displayed.   Lab Results  Component Value Date   TSH 3.939 09/12/2019   Lab  Results  Component Value Date   HGBA1C 5.5 09/10/2019   Lab Results  Component Value Date   CHOL 133 09/10/2019   HDL 41 09/10/2019   LDLCALC 77 09/10/2019   TRIG 73 09/10/2019   CHOLHDL 3.2 09/10/2019    Significant Diagnostic Results in last 30 days:  CT Angio Head W or Wo Contrast  Result Date: 09/10/2019 CLINICAL DATA:  Follow-up examination for acute stroke. EXAM: CT ANGIOGRAPHY HEAD AND NECK TECHNIQUE: Multidetector CT imaging of the head and neck was performed using the standard protocol during bolus administration of intravenous contrast. Multiplanar CT image reconstructions and MIPs were obtained to evaluate the vascular anatomy. Carotid stenosis measurements (when applicable) are obtained utilizing NASCET criteria, using the distal internal carotid diameter as the  denominator. CONTRAST:  80mL OMNIPAQUE IOHEXOL 350 MG/ML SOLN COMPARISON:  Prior head CT from earlier the same day. FINDINGS: CT HEAD FINDINGS Brain: Generalized age-related cerebral atrophy with chronic small vessel ischemic disease. Few scatter remote lacunar infarcts noted about the bilateral basal ganglia. Small chronic right cerebellar infarct. No acute intracranial hemorrhage. No acute large vessel territory infarct. No mass lesion, midline shift or mass effect. Mild ventricular prominence related to global parenchymal volume loss without hydrocephalus. No extra-axial fluid collection. Vascular: No hyperdense vessel. Calcified atherosclerosis present at skull base. Skull: Scalp soft tissues and calvarium within normal limits. Sinuses: Mild chronic mucoperiosteal thickening noted within the ethmoidal air cells. Paranasal sinuses are otherwise clear. No mastoid effusion. Orbits: Globes and orbital soft tissues within normal limits. Review of the MIP images confirms the above findings CTA NECK FINDINGS Aortic arch: Visualized aortic arch of normal caliber with normal 3 vessel morphology. Moderate atherosclerotic change about the arch and origin of the great vessels without hemodynamically significant stenosis. Visualized subclavian arteries patent bilaterally. Right carotid system: Right common carotid artery patent from its origin to the bifurcation without stenosis. Mild scattered calcified plaque about the right bifurcation/proximal right ICA without hemodynamically significant stenosis. Right ICA patent distally to the skull base without stenosis, dissection or occlusion. Left carotid system: Scattered calcified plaque within the left CCA without hemodynamically significant stenosis. Concentric predominantly soft plaque about the left bifurcation with associated stenosis of up to 55% by NASCET criteria. Left ICA patent distally to the skull base without stenosis, dissection or occlusion. Vertebral arteries:  Both vertebral arteries arise from the subclavian arteries. Right vertebral artery dominant. Focal plaque at the origin of the right vertebral artery with relatively mild stenosis. Scattered atheromatous irregularity within the vertebral arteries distally without additional stenosis, dissection, or occlusion. Skeleton: No acute osseous abnormality. No discrete or worrisome osseous lesions. Moderate cervical spondylosis present at C4-5 and C5-6. Other neck: No other acute soft tissue abnormality within the neck. No mass lesion or adenopathy. Upper chest: Visualized upper chest demonstrates no acute finding. Mild centrilobular emphysema. Left-sided pacemaker/AICD in place. Review of the MIP images confirms the above findings CTA HEAD FINDINGS Anterior circulation: Petrous segments widely patent bilaterally. Scattered calcified plaque throughout the cavernous/supraclinoid ICAs with associated moderate multifocal stenosis. A1 segments patent bilaterally. Normal anterior communicating artery complex. Anterior cerebral arteries patent to their distal aspects without stenosis. Short-segment mild to moderate stenosis noted within the mid right M1 segment (series 12, image 22). M1 segments otherwise patent bilaterally. Normal MCA bifurcations. Distal MCA branches well perfused and symmetric. Diffuse small vessel atheromatous irregularity. Posterior circulation: Multifocal atheromatous irregularity throughout the dominant right V4 segment without high-grade stenosis. Right PICA patent. Multifocal severe near occlusive stenoses seen involving  the diminutive left V4 segment, which nearly occludes by the vertebrobasilar junction. Left PICA patent. Moderate irregular stenosis seen involving the proximal basilar artery. Basilar diffusely irregular but otherwise patent to its distal aspect without high-grade stenosis. Superior cerebral arteries patent bilaterally. Predominant fetal type origin of both PCAs supplied via hypoplastic  P1 segments and robust bilateral posterior communicating arteries. Right PCA patent to its distal aspect without high-grade stenosis. Short-segment moderate mid left P2 stenosis noted. Left PCA otherwise patent. Venous sinuses: Patent. Anatomic variants: Predominant fetal type origin of the PCAs. No intracranial aneurysm. Review of the MIP images confirms the above findings IMPRESSION: CT HEAD IMPRESSION: 1. Stable head CT.  No acute intracranial abnormality identified. 2. Age-related cerebral atrophy with chronic small vessel ischemic disease with scattered chronic lacunar infarcts as above. CTA HEAD AND NECK IMPRESSION: 1. Negative CTA for large vessel occlusion. 2. 50% atheromatous stenosis about the left carotid bifurcation. 3. Moderate multifocal atheromatous irregularity throughout the intracranial circulation as above. Notable findings include moderate multifocal stenoses about the carotid siphons, mild to moderate mid right M1 stenosis, multifocal severe near occlusive these for stenoses, irregular moderate stenosis of the proximal basilar artery, with moderate left P2 stenosis. 4.  Emphysema (ICD10-J43.9). Electronically Signed   By: Jeannine Boga M.D.   On: 09/10/2019 01:20   CT HEAD WO CONTRAST  Result Date: 09/12/2019 CLINICAL DATA:  Follow-up stroke. EXAM: CT HEAD WITHOUT CONTRAST TECHNIQUE: Contiguous axial images were obtained from the base of the skull through the vertex without intravenous contrast. COMPARISON:  Sep 10, 2019 and Sep 09, 2019. FINDINGS: Brain: Increased low density is noted in the right insular subcortical white matter consistent with acute infarction as described on prior MRI. No mass effect or midline shift is noted. Ventricular size is within normal limits. No hemorrhage is noted. No definite mass lesion is noted. Vascular: No hyperdense vessel or unexpected calcification. Skull: Normal. Negative for fracture or focal lesion. Sinuses/Orbits: No acute finding. Other:  None. IMPRESSION: Increased low density is noted in the right insular subcortical white matter consistent with acute infarction as described on prior MRI. No mass effect or midline shift is noted. No hemorrhage is noted. Electronically Signed   By: Marijo Conception M.D.   On: 09/12/2019 11:43   CT Head Wo Contrast  Result Date: 09/09/2019 CLINICAL DATA:  Encephalopathy. Additional history provided: Generalized weakness, poor appetite, increased urinary frequency, bladder incontinence. Patient's wife reports slurred speech since Friday and confusion. Patient reports weakness. EXAM: CT HEAD WITHOUT CONTRAST TECHNIQUE: Contiguous axial images were obtained from the base of the skull through the vertex without intravenous contrast. COMPARISON:  Noncontrast head CT 05/07/2019 FINDINGS: Brain: Ill-defined hypoattenuation within the cerebral white matter is nonspecific, but consistent with chronic small vessel ischemic disease. Redemonstrated chronic lacunar infarcts within the left caudate head and right cerebellum. Moderate generalized parenchymal atrophy. There is no acute intracranial hemorrhage. No demarcated cortical infarct. No extra-axial fluid collection. No evidence of intracranial mass. No midline shift. Vascular: No hyperdense vessel.  Atherosclerotic calcifications. Skull: Normal. Negative for fracture or focal lesion. Sinuses/Orbits: Visualized orbits show no acute finding. Minimal ethmoid and maxillary sinus mucosal thickening. No significant mastoid effusion. IMPRESSION: 1. No evidence of acute intracranial abnormality. 2. Stable generalized parenchymal atrophy and chronic small vessel ischemic disease. Redemonstrated chronic lacunar infarcts within the left caudate head and right cerebellum. 3. Minimal ethmoid and maxillary sinus mucosal thickening. Electronically Signed   By: Kellie Simmering DO   On: 09/09/2019 11:33  CT Angio Neck W and/or Wo Contrast  Result Date: 09/10/2019 CLINICAL DATA:   Follow-up examination for acute stroke. EXAM: CT ANGIOGRAPHY HEAD AND NECK TECHNIQUE: Multidetector CT imaging of the head and neck was performed using the standard protocol during bolus administration of intravenous contrast. Multiplanar CT image reconstructions and MIPs were obtained to evaluate the vascular anatomy. Carotid stenosis measurements (when applicable) are obtained utilizing NASCET criteria, using the distal internal carotid diameter as the denominator. CONTRAST:  77mL OMNIPAQUE IOHEXOL 350 MG/ML SOLN COMPARISON:  Prior head CT from earlier the same day. FINDINGS: CT HEAD FINDINGS Brain: Generalized age-related cerebral atrophy with chronic small vessel ischemic disease. Few scatter remote lacunar infarcts noted about the bilateral basal ganglia. Small chronic right cerebellar infarct. No acute intracranial hemorrhage. No acute large vessel territory infarct. No mass lesion, midline shift or mass effect. Mild ventricular prominence related to global parenchymal volume loss without hydrocephalus. No extra-axial fluid collection. Vascular: No hyperdense vessel. Calcified atherosclerosis present at skull base. Skull: Scalp soft tissues and calvarium within normal limits. Sinuses: Mild chronic mucoperiosteal thickening noted within the ethmoidal air cells. Paranasal sinuses are otherwise clear. No mastoid effusion. Orbits: Globes and orbital soft tissues within normal limits. Review of the MIP images confirms the above findings CTA NECK FINDINGS Aortic arch: Visualized aortic arch of normal caliber with normal 3 vessel morphology. Moderate atherosclerotic change about the arch and origin of the great vessels without hemodynamically significant stenosis. Visualized subclavian arteries patent bilaterally. Right carotid system: Right common carotid artery patent from its origin to the bifurcation without stenosis. Mild scattered calcified plaque about the right bifurcation/proximal right ICA without  hemodynamically significant stenosis. Right ICA patent distally to the skull base without stenosis, dissection or occlusion. Left carotid system: Scattered calcified plaque within the left CCA without hemodynamically significant stenosis. Concentric predominantly soft plaque about the left bifurcation with associated stenosis of up to 55% by NASCET criteria. Left ICA patent distally to the skull base without stenosis, dissection or occlusion. Vertebral arteries: Both vertebral arteries arise from the subclavian arteries. Right vertebral artery dominant. Focal plaque at the origin of the right vertebral artery with relatively mild stenosis. Scattered atheromatous irregularity within the vertebral arteries distally without additional stenosis, dissection, or occlusion. Skeleton: No acute osseous abnormality. No discrete or worrisome osseous lesions. Moderate cervical spondylosis present at C4-5 and C5-6. Other neck: No other acute soft tissue abnormality within the neck. No mass lesion or adenopathy. Upper chest: Visualized upper chest demonstrates no acute finding. Mild centrilobular emphysema. Left-sided pacemaker/AICD in place. Review of the MIP images confirms the above findings CTA HEAD FINDINGS Anterior circulation: Petrous segments widely patent bilaterally. Scattered calcified plaque throughout the cavernous/supraclinoid ICAs with associated moderate multifocal stenosis. A1 segments patent bilaterally. Normal anterior communicating artery complex. Anterior cerebral arteries patent to their distal aspects without stenosis. Short-segment mild to moderate stenosis noted within the mid right M1 segment (series 12, image 22). M1 segments otherwise patent bilaterally. Normal MCA bifurcations. Distal MCA branches well perfused and symmetric. Diffuse small vessel atheromatous irregularity. Posterior circulation: Multifocal atheromatous irregularity throughout the dominant right V4 segment without high-grade stenosis.  Right PICA patent. Multifocal severe near occlusive stenoses seen involving the diminutive left V4 segment, which nearly occludes by the vertebrobasilar junction. Left PICA patent. Moderate irregular stenosis seen involving the proximal basilar artery. Basilar diffusely irregular but otherwise patent to its distal aspect without high-grade stenosis. Superior cerebral arteries patent bilaterally. Predominant fetal type origin of both PCAs supplied via hypoplastic P1  segments and robust bilateral posterior communicating arteries. Right PCA patent to its distal aspect without high-grade stenosis. Short-segment moderate mid left P2 stenosis noted. Left PCA otherwise patent. Venous sinuses: Patent. Anatomic variants: Predominant fetal type origin of the PCAs. No intracranial aneurysm. Review of the MIP images confirms the above findings IMPRESSION: CT HEAD IMPRESSION: 1. Stable head CT.  No acute intracranial abnormality identified. 2. Age-related cerebral atrophy with chronic small vessel ischemic disease with scattered chronic lacunar infarcts as above. CTA HEAD AND NECK IMPRESSION: 1. Negative CTA for large vessel occlusion. 2. 50% atheromatous stenosis about the left carotid bifurcation. 3. Moderate multifocal atheromatous irregularity throughout the intracranial circulation as above. Notable findings include moderate multifocal stenoses about the carotid siphons, mild to moderate mid right M1 stenosis, multifocal severe near occlusive these for stenoses, irregular moderate stenosis of the proximal basilar artery, with moderate left P2 stenosis. 4.  Emphysema (ICD10-J43.9). Electronically Signed   By: Jeannine Boga M.D.   On: 09/10/2019 01:20   MR BRAIN WO CONTRAST  Result Date: 09/10/2019 CLINICAL DATA:  Generalized weakness. Mental status changes. Negative CT evaluation. EXAM: MRI HEAD WITHOUT CONTRAST TECHNIQUE: Multiplanar, multiecho pulse sequences of the brain and surrounding structures were obtained  without intravenous contrast. COMPARISON:  CT angiography 09/09/2019. FINDINGS: Brain: Diffusion imaging shows a small region of acute infarction affecting the right deep insular cortex in the underlying radiating white matter tracts. No evidence of hemorrhage or mass effect. No other acute infarction. Elsewhere, there are old small vessel ischemic changes of the pons. Old small vessel cerebellar infarction on the right. Moderate changes of chronic small vessel disease of the cerebral hemispheric white matter. No mass lesion, recent hemorrhage, hydrocephalus or extra-axial collection. Small focus of hemosiderin deposition in the right frontal white matter related to an old insult. Vascular: Flow is present within the major vessels at the base of the brain, with the exception of the left vertebral artery which shows abnormal flow, better demonstrated at CT angiography. Skull and upper cervical spine: Negative Sinuses/Orbits: Clear/normal Other: None IMPRESSION: Acute infarction in the right deep insula and radiating white matter tracts. No mass effect or hemorrhage. Atrophy and chronic small-vessel ischemic changes affecting the brain elsewhere as described. Old right frontal white matter hemorrhage. Abnormal flow in the distal left vertebral artery as shown by CT angiography, presumably unrelated to the acute insult. Electronically Signed   By: Nelson Chimes M.D.   On: 09/10/2019 14:17   DG CHEST PORT 1 VIEW  Result Date: 09/10/2019 CLINICAL DATA:  Adjustment Management of cardiac pacemaker EXAM: PORTABLE CHEST 1 VIEW COMPARISON:  09/09/2019 FINDINGS: There is no focal consolidation. There is no pleural effusion or pneumothorax. The heart and mediastinal contours are unremarkable. There is thoracic aortic atherosclerosis. There is a dual lead cardiac pacemaker with the leads in satisfactory unchanged position on a single frontal AP view. There is no acute osseous abnormality. There is moderate osteoarthritis of  the right glenohumeral joint. IMPRESSION: No active disease. Electronically Signed   By: Kathreen Devoid   On: 09/10/2019 11:05   DG Chest Portable 1 View  Result Date: 09/09/2019 CLINICAL DATA:  Weakness. EXAM: PORTABLE CHEST 1 VIEW COMPARISON:  May 06, 2019 FINDINGS: Stable cardiac pacemaker/defibrillator. Enlarged cardiac silhouette. Calcific atherosclerotic disease of the aorta. There is no evidence of focal airspace consolidation, pleural effusion or pneumothorax. Osseous structures are without acute abnormality. Soft tissues are grossly normal. IMPRESSION: Enlarged cardiac silhouette, otherwise no acute process. Electronically Signed   By: Thomas Hoff  Dimitrova M.D.   On: 09/09/2019 11:36   DG Swallowing Func-Speech Pathology  Result Date: 09/12/2019 Objective Swallowing Evaluation: Type of Study: MBS-Modified Barium Swallow Study  Patient Details Name: LUCKY GARCES MRN: [id number] Date of Birth: June 24, 1925 Today's Date: 09/12/2019 Time: SLP Start Time (ACUTE ONLY): 1155 -SLP Stop Time (ACUTE ONLY): 1215 SLP Time Calculation (min) (ACUTE ONLY): 20 min Past Medical History: Past Medical History: Diagnosis Date . Hypertension  . Hypothyroidism  . Presence of permanent cardiac pacemaker  Past Surgical History: No past surgical history on file. HPI: 86 yo male adm to [facility] with weakness, dysarthria and fall with concern for acute CVA.  Pt CT scan head showed chronic lacunar CVAs x2 basal ganglia, small right cerebellar CVA.  MRI showed insular CVA. Concern for worsening symptoms, so new CT has been ordered to r/o extension.  Subjective: Pt upright in MBS chair, agreeable to study. Assessment / Plan / Recommendation CHL IP CLINICAL IMPRESSIONS 09/12/2019 Clinical Impression Mr. Gaebler was seen for a repeat MBS with concern for worsening swallow function compared to two days prior (see MBS note 5/10). Pt does demonstrate a mildly worsened dysphagia (moderate-severe pharyngeal dysphagia) with increased pharyngeal  weakness. Oral phase remains mildly impaired and c/b reduced lingual control for bolus cohesion, resulting in trace buccal residue on L. He does not appear sensate to this residue. Pharyngeally, pt also demonstrates reduced sensation with absent swallow trigger noted intermittently with bolus resting in valleculae/pyriform sinus, requiring a cue to swallow. Pt demonstrates further reduced hyoid excursion, laryngeal elevation and pharyngeal constriction, which results in increased residue in pharynx around pyriform sinus, which was noted to spill into airway with subsequent swallows. Pt had trace aspiration noted with thin liquids via spoon/cup, nectar thick liquids via cup/straw, honey thick liquids via cup. Most instances of aspiration were from residue (versus from the initial swallow). He was only noted to sense aspiration x1 with cough response. Pt naturally lowers chin, which greatly worsens the aspiration. With pt being told to take small sips, he begins well, with use of small sips, but then takes larger sips, resulting in aspiration. Given use of spoon, pt still had some penetration from residue after the swallow, but no aspiration was visualized. He had no penetration/aspiration of purees or ground solids, but does continue with trace-mild residue in valleculae and pyriform sinus. Overall, pt is at high risk for aspiration/aspiration PNA, however, given use of spoon with nectar thick liquids and ground solids, pt was seen with improved performance. D/t high risk of dehydration on this nectar thick (spoon) consistency, recommend he continue with free water protocol after oral care. Water may be consumed by cup. Pt thoroughly educated to continue prior precautions of coughing intermittently and keeping head in neutral position. He demonstrated understanding and agreement. SLP Visit Diagnosis Dysphagia, oropharyngeal phase (R13.12) Impact on safety and function Moderate aspiration risk;Severe aspiration risk    CHL IP TREATMENT RECOMMENDATION 09/10/2019 Treatment Recommendations Defer until completion of intrumental exam   Prognosis 09/12/2019 Prognosis for Safe Diet Advancement Fair Barriers to Reach Goals Severity of deficits CHL IP DIET RECOMMENDATION 09/12/2019 SLP Diet Recommendations Nectar thick liquid;Dysphagia 2 (Fine chop) solids Liquid Administration via Spoon Medication Administration Whole meds with puree Compensations Small sips/bites;Hard cough after swallow Postural Changes Neutral/normal head position   CHL IP OTHER RECOMMENDATIONS 09/12/2019 Recommended Consults n/a Oral Care Recommendations Oral care BID Other Recommendations    CHL IP FOLLOW UP RECOMMENDATIONS 09/12/2019 Follow up Recommendations Skilled Nursing facility   [facility] IP  FREQUENCY AND DURATION 09/12/2019 Speech Therapy Frequency (ACUTE ONLY) min 2x/week Treatment Duration 1 week      CHL IP ORAL PHASE 09/12/2019 Oral Phase Impaired Oral - Thin Cup Piecemeal swallowing Oral - Thin Straw NT Oral - Puree Piecemeal swallowing Oral - Mech Soft Left pocketing in lateral sulci  CHL IP PHARYNGEAL PHASE 09/12/2019 Pharyngeal Phase Impaired Pharyngeal- Honey Cup Delayed swallow initiation-pyriform sinuses;Reduced pharyngeal peristalsis;Reduced epiglottic inversion;Reduced anterior laryngeal mobility;Reduced laryngeal elevation;Reduced airway/laryngeal closure;Reduced tongue base retraction;Penetration/Apiration after swallow;Trace aspiration;Pharyngeal residue - cp segment  Pharyngeal Material enters airway, passes BELOW cords without attempt by patient to eject out (silent aspiration) Pharyngeal- Nectar Teaspoon Delayed swallow initiation-pyriform sinuses;Reduced pharyngeal peristalsis;Reduced epiglottic inversion;Reduced anterior laryngeal mobility;Reduced laryngeal elevation;Reduced airway/laryngeal closure;Reduced tongue base retraction;Penetration/Apiration after swallow;Trace aspiration;Pharyngeal residue - cp segment Pharyngeal Material does not enter  airway Pharyngeal- Nectar Cup Delayed swallow initiation-pyriform sinuses;Reduced pharyngeal peristalsis;Reduced epiglottic inversion;Reduced anterior laryngeal mobility;Reduced laryngeal elevation;Reduced airway/laryngeal closure;Reduced tongue base retraction;Penetration/Apiration after swallow;Trace aspiration;Pharyngeal residue - cp segment Pharyngeal Material enters airway, passes BELOW cords without attempt by patient to eject out (silent aspiration) Pharyngeal- Nectar Straw Delayed swallow initiation-pyriform sinuses;Reduced pharyngeal peristalsis;Reduced epiglottic inversion;Reduced anterior laryngeal mobility;Reduced laryngeal elevation;Reduced airway/laryngeal closure;Reduced tongue base retraction;Penetration/Apiration after swallow;Trace aspiration;Pharyngeal residue - cp segment Pharyngeal Material enters airway, passes BELOW cords without attempt by patient to eject out (silent aspiration) Pharyngeal- Thin Teaspoon Penetration/Aspiration before swallow Pharyngeal Material enters airway, CONTACTS cords and not ejected out; Material enters airway, passes BELOW cords without attempt by patient to eject out (silent aspiration) Pharyngeal- Thin Cup Pharyngeal residue - cp segment;Pharyngeal residue - pyriform;Trace aspiration;Penetration/Aspiration before swallow;Penetration/Apiration after swallow;Reduced pharyngeal peristalsis;Reduced epiglottic inversion;Reduced anterior laryngeal mobility;Reduced laryngeal elevation;Delayed swallow initiation-pyriform sinuses Pharyngeal Material enters airway, passes BELOW cords without attempt by patient to eject out (silent aspiration) Pharyngeal- Thin Straw NT Pharyngeal Material enters airway, passes BELOW cords without attempt by patient to eject out (silent aspiration) Pharyngeal- Puree Delayed swallow initiation-vallecula;Reduced pharyngeal peristalsis;Reduced tongue base retraction;Pharyngeal residue - valleculae;Pharyngeal residue - pyriform Pharyngeal Material  does not enter airway Pharyngeal- Mechanical Soft Reduced epiglottic inversion;Reduced pharyngeal peristalsis;Delayed swallow initiation-vallecula;Pharyngeal residue - pyriform;Pharyngeal residue - valleculae  CHL IP CERVICAL ESOPHAGEAL PHASE 09/10/2019 Cervical Esophageal Phase [facility] P. Isenhour, M.S., CCC-SLP Speech-Language Pathologist Acute Rehabilitation Services Pager: [place] 09/12/2019, 1:48 PM              DG Swallowing Func-Speech Pathology  Result Date: 09/10/2019 Objective Swallowing Evaluation: Type of Study: MBS-Modified Barium Swallow Study  Patient Details Name: HENSLEY EBERL MRN: [id number] Date of Birth: 1925-12-03 Today's Date: 09/10/2019 Time: SLP Start Time (ACUTE ONLY): 0919 -SLP Stop Time (ACUTE ONLY): 0940 SLP Time Calculation (min) (ACUTE ONLY): 21 min Past Medical History: Past Medical History: Diagnosis Date . Hypertension  . Hypothyroidism  . Presence of permanent cardiac pacemaker  Past Surgical History: No past surgical history on file. HPI: 87 yo male adm to [facility] with weakness, dysarthria and fall with concern for acute CVA. Pt has h/o falling and hitting his head.  Pt CT scan head showed chronic lacunar CVAs x2 basal ganglia, small right cerebellar CVA.  Pt has h/o Bell's palsy 20 years prior, remote smoking/cigar use ceasing 40 years ago.  He failed a Audiological scientist and SLP was ordered. MRI pending/delayed due to pacemaker.  Subjective: pt awake in chair Assessment / Plan / Recommendation CHL IP CLINICAL IMPRESSIONS 09/10/2019 Clinical Impression Patient presentn with moderate oropharyngeal dysphagia with sensorimotor difficulties result in discoordination with premature spillage of boluses into pharynx/larynx before  swallow triggered.  Pooling to pyriform sinus noted up to 6 seconds with thin liquids.  Chin tuck posture allowed SILENT aspiration of thin and nectar consistencies - and worsened airway protection.  Control of bolus strictly to small single cup sip  prevented aspiration of nectar.  Pt did have very delayed cough after aspirates - large amount of aspiration but did not clear aspirates.  Head turn left did not prevent aspiration nor decrease residuals.  Pt with mild vallecular retention with liquids and puree- Cues to "hock" helpful to decrease x1.  There were also 2 occasions where pt thought he had swallowed and he had not - initiation difficulties noted. Recommend pt have dys3/ground meat/nectar diet and allow thin water between meals after mouth care for hydration/QOL.  Using teach back, educated pt to findings/recommendations with live monitor.  Will follow up for dysphagia management.    SLP Visit Diagnosis Dysphagia, oropharyngeal phase (R13.12) Attention and concentration deficit following -- Frontal lobe and executive function deficit following -- Impact on safety and function Moderate aspiration risk   CHL IP TREATMENT RECOMMENDATION 09/10/2019 Treatment Recommendations Defer until completion of intrumental exam   Prognosis 09/10/2019 Prognosis for Safe Diet Advancement Good Barriers to Reach Goals -- Barriers/Prognosis Comment -- CHL IP DIET RECOMMENDATION 09/10/2019 SLP Diet Recommendations Dysphagia 3 (Mech soft) solids;Nectar thick liquid Liquid Administration via Spoon;Cup;No straw Medication Administration Whole meds with puree Compensations Slow rate;Small sips/bites;Effortful swallow;Other (Comment) Postural Changes Remain semi-upright after after feeds/meals (Comment);Seated upright at 90 degrees   CHL IP OTHER RECOMMENDATIONS 09/10/2019 Recommended Consults -- Oral Care Recommendations Oral care QID Other Recommendations Order thickener from pharmacy;Have oral suction available   No flowsheet data found.  No flowsheet data found.     CHL IP ORAL PHASE 09/10/2019 Oral Phase Impaired Oral - Pudding Teaspoon -- Oral - Pudding Cup -- Oral - Honey Teaspoon -- Oral - Honey Cup -- Oral - Nectar Teaspoon Delayed oral transit;Decreased bolus  cohesion;Premature spillage Oral - Nectar Cup Delayed oral transit;Decreased bolus cohesion;Premature spillage Oral - Nectar Straw Delayed oral transit;Decreased bolus cohesion;Premature spillage Oral - Thin Teaspoon Delayed oral transit;Decreased bolus cohesion;Premature spillage Oral - Thin Cup Delayed oral transit;Decreased bolus cohesion;Premature spillage Oral - Thin Straw Delayed oral transit;Decreased bolus cohesion;Premature spillage Oral - Puree Delayed oral transit;Decreased bolus cohesion;Premature spillage Oral - Mech Soft Delayed oral transit Oral - Regular Decreased velopharyngeal closure;Nasal reflux;Delayed oral transit Oral - Multi-Consistency -- Oral - Pill -- Oral Phase - Comment --  CHL IP PHARYNGEAL PHASE 09/10/2019 Pharyngeal Phase Impaired Pharyngeal- Pudding Teaspoon -- Pharyngeal -- Pharyngeal- Pudding Cup -- Pharyngeal -- Pharyngeal- Honey Teaspoon -- Pharyngeal -- Pharyngeal- Honey Cup -- Pharyngeal -- Pharyngeal- Nectar Teaspoon Delayed swallow initiation-pyriform sinuses Pharyngeal Material does not enter airway Pharyngeal- Nectar Cup Delayed swallow initiation-pyriform sinuses Pharyngeal Material does not enter airway Pharyngeal- Nectar Straw Delayed swallow initiation-pyriform sinuses;Penetration/Aspiration before swallow;Moderate aspiration Pharyngeal Material enters airway, passes BELOW cords without attempt by patient to eject out (silent aspiration) Pharyngeal- Thin Teaspoon Delayed swallow initiation-vallecula;Pharyngeal residue - valleculae Pharyngeal Material does not enter airway Pharyngeal- Thin Cup Delayed swallow initiation-pyriform sinuses;Reduced anterior laryngeal mobility;Reduced laryngeal elevation;Reduced airway/laryngeal closure;Penetration/Aspiration before swallow Pharyngeal Material enters airway, passes BELOW cords without attempt by patient to eject out (silent aspiration) Pharyngeal- Thin Straw Delayed swallow initiation-pyriform sinuses Pharyngeal Material  enters airway, passes BELOW cords without attempt by patient to eject out (silent aspiration) Pharyngeal- Puree Delayed swallow initiation-vallecula;Reduced pharyngeal peristalsis;Reduced epiglottic inversion;Reduced tongue base retraction;Pharyngeal residue - valleculae Pharyngeal -- Pharyngeal- Mechanical  Soft Delayed swallow initiation-vallecula Pharyngeal -- Pharyngeal- Regular -- Pharyngeal -- Pharyngeal- Multi-consistency -- Pharyngeal -- Pharyngeal- Pill -- Pharyngeal -- Pharyngeal Comment nectar via cup with head turn left did not prevent aspiration, allowed trace aspiration posterior trach, chin tuck worsened airway protection with gross spillage over epiglottis, During MBS, pt stated "I swallowed" x2 when he had not.  Controlling amount of nectar strictly prevented aspiration but pt at higher risk due to silent nature of aspiration.  Larger liquid bolus faciliated efficient swallow.    CHL IP CERVICAL ESOPHAGEAL PHASE 09/10/2019 Cervical Esophageal Phase WFL  Upon esophageal sweep, pt appeared clear Pudding Teaspoon -- Pudding Cup -- Honey Teaspoon -- Honey Cup -- Nectar Teaspoon -- Nectar Cup -- Nectar Straw -- Thin Teaspoon -- Thin Cup -- Thin Straw -- Puree -- Mechanical Soft -- Regular -- Multi-consistency -- Pill -- Cervical Esophageal Comment -- Kathleen Lime, MS [facility] SLP Acute Rehab Services Office [phone] [name] 09/10/2019, 10:44 AM              EEG adult  Result Date: 09/11/2019 Lora Havens, MD     09/11/2019 10:23 AM Patient Name: [name] MRN: [id number] Epilepsy Attending: Lora Havens Referring Physician/Frederick Klinger: Dr. Mitzi Hansen Date: 09/10/2019 Duration: 27.08 mins Patient history: 86 year old male who presented with sudden onset of slurred speech left-sided weakness and difficulty walking.  EEG to evaluate for seizures. Level of alertness: Awake, asleep AEDs during EEG study: None Technical aspects: This EEG study was done with scalp electrodes positioned  according to the 10-20 International system of electrode placement. Electrical activity was acquired at a sampling rate of 500Hz  and reviewed with a high frequency filter of 70Hz  and a low frequency filter of 1Hz . EEG data were recorded continuously and digitally stored. Description: During awake state, no clear posterior dominant was seen. Sleep was characterized by vertex waves and intermittent generalized 2 to 3 Hz delta slowing.  EEG also showed generalized polymorphic mixed frequencies with predominantly 8-13 Hz alpha-beta activity. Hyperventilation and photic stimulation were not performed. IMPRESSION: This study is within normal limits. No seizures or epileptiform discharges were seen throughout the recording. Lora Havens   ECHOCARDIOGRAM COMPLETE  Result Date: 09/11/2019    ECHOCARDIOGRAM REPORT   Patient Name:   SUSIE BUBEL Date of Exam: 09/11/2019 Medical Rec #:  [id number]      Height:       69.0 in Accession #:    [id number]     Weight:       150.0 lb Date of Birth:  01/13/1926      BSA:          1.828 m Patient Age:    83 years       BP:           159/76 mmHg Patient Gender: M              HR:           68 bpm. Exam Location:  Inpatient Procedure: 2D Echo, Cardiac Doppler and Color Doppler Indications:    Stroke 434.91 / I163.9  History:        Patient has no prior history of Echocardiogram examinations.  Sonographer:    Jonelle Sidle Dance Referring Phys: [id number] [place]  1. Left ventricular ejection fraction, by estimation, is 60 to 65%. The left ventricle has normal function. The left ventricle has no regional wall motion abnormalities. Normal left ventricular annular diastolic velocities are consistent  with normal left ventricular relaxation properties and normal left atrial pressure. Diminutive atrial contraction-related velocities are suggestive of recent atrial fibrillation with stunning or left atrial mechanical failure.  2. Right ventricular systolic function is normal.  The right ventricular size is normal. Tricuspid regurgitation signal is inadequate for assessing PA pressure.  3. Left atrial size was mildly dilated.  4. The mitral valve is normal in structure. Trivial mitral valve regurgitation.  5. The aortic valve is normal in structure. Aortic valve regurgitation is trivial.  6. Aortic dilatation noted. There is mild dilatation of the ascending aorta measuring 40 mm.  7. The inferior vena cava is normal in size with greater than 50% respiratory variability, suggesting right atrial pressure of 3 mmHg. FINDINGS  Left Ventricle: Left ventricular ejection fraction, by estimation, is 60 to 65%. The left ventricle has normal function. The left ventricle has no regional wall motion abnormalities. The left ventricular internal cavity size was normal in size. There is  no left ventricular hypertrophy. Normal left ventricular annular diastolic velocities are consistent with normal left ventricular relaxation properties and normal left atrial pressure. Diminutive atrial contraction-related velocities are suggestive of recent atrial fibrillation with stunning or left atrial mechanical failure. Right Ventricle: The right ventricular size is normal. No increase in right ventricular wall thickness. Right ventricular systolic function is normal. Tricuspid regurgitation signal is inadequate for assessing PA pressure. Left Atrium: Left atrial size was mildly dilated. Right Atrium: Right atrial size was normal in size. Pericardium: A small pericardial effusion is present. The pericardial effusion is circumferential. There is no evidence of cardiac tamponade. Mitral Valve: The mitral valve is normal in structure. Trivial mitral valve regurgitation. Tricuspid Valve: The tricuspid valve is normal in structure. Tricuspid valve regurgitation is not demonstrated. Aortic Valve: The aortic valve is normal in structure. Aortic valve regurgitation is trivial. Pulmonic Valve: The pulmonic valve was not  well visualized. Pulmonic valve regurgitation is not visualized. Aorta: The aortic root is normal in size and structure and aortic dilatation noted. There is mild dilatation of the ascending aorta measuring 40 mm. Venous: The inferior vena cava is normal in size with greater than 50% respiratory variability, suggesting right atrial pressure of 3 mmHg. IAS/Shunts: No atrial level shunt detected by color flow Doppler. Additional Comments: A pacer wire is visualized.  LEFT VENTRICLE PLAX 2D LVIDd:         5.49 cm LV PW:         1.22 cm LV IVS:        1.01 cm LVOT diam:     2.10 cm LV SV:         56 LV SV Index:   31 LVOT Area:     3.46 cm  RIGHT VENTRICLE             IVC RV Basal diam:  2.33 cm     IVC diam: 1.54 cm RV S prime:     12.40 cm/s TAPSE (M-mode): 2.1 cm LEFT ATRIUM             Index       RIGHT ATRIUM           Index LA diam:        4.30 cm 2.35 cm/m  RA Area:     14.10 cm LA Vol (A2C):   65.8 ml 35.99 ml/m RA Volume:   31.30 ml  17.12 ml/m LA Vol (A4C):   55.3 ml 30.25 ml/m LA Biplane Vol: 61.8 ml 33.80  ml/m  AORTIC VALVE LVOT Vmax:   78.00 cm/s LVOT Vmean:  50.950 cm/s LVOT VTI:    0.161 m  AORTA Ao Root diam: 4.00 cm Ao Asc diam:  4.00 cm MITRAL VALVE MV Area (PHT): 1.78 cm    SHUNTS MV Decel Time: 426 msec    Systemic VTI:  0.16 m MV E velocity: 62.10 cm/s  Systemic Diam: 2.10 cm Dani Gobble Croitoru MD Electronically signed by Sanda Klein MD Signature Date/Time: 09/11/2019/10:11:23 AM    Final     Assessment/Plan Acute systolic heart failure Compensated, not taking diuretics. EF 60%  Atrial fibrillation (HCC) Heart rate is in control, continue Metoprolol. Continue Coumadin for VTE risk reduction  HYPERTENSION, BENIGN Blood pressure is controlled, continue Metoprolol.   Sinus node dysfunction (HCC) S/p pace maker  Hypothyroidism TSH wnl, continue Levothyroxine.   BPH (benign prostatic hyperplasia) Urinary frequency, s/p TURP, s/p urology evaluation, failed many meds   Hyperlipidemia LDL 91 10/2018, continue Atorvastatin  Polymyalgia rheumatica ([place]) Stable.   Polycythemia 09/09/19 Rbc 4.43, Hgb 13.4, plt 134, HCT 42.2  Acute CVA (cerebrovascular accident) ([place]) 09/10/2019 per MRI, LU5/10/21 MRI IMPRESSION: Acute infarction in the right deep insula and radiating white matter tracts. No mass effect or hemorrhage.E 2/5, LLE 4/5, left face weakness  Slow transit constipation Last BM last night since admitted to hospital 09/09/19 after laxative. Will add Senokot S II po qd.   Dysphagia ST, Nectar.      Family/ staff Communication: plan of care reviewed with the patient and charge nurse.   Labs/tests ordered:  none  Time spend 25 minutes.

## 2019-09-14 NOTE — Assessment & Plan Note (Signed)
Blood pressure is controlled, continue Metoprolol. 

## 2019-09-14 NOTE — Assessment & Plan Note (Signed)
Heart rate is in control, continue Metoprolol. Continue Coumadin for VTE risk reduction

## 2019-09-14 NOTE — Assessment & Plan Note (Signed)
09/09/19 Rbc 4.43, Hgb 13.4, plt 134, HCT 42.2

## 2019-09-14 NOTE — Social Work (Signed)
CSW contacted patient's spouse Eden Lathe for patient's SSN. [id number].   [employer], LCSWA

## 2019-09-14 NOTE — Discharge Instructions (Signed)
Eating Plan After Stroke A stroke causes damage to the brain cells, which can affect your ability to walk, talk, and even eat. The impact of a stroke is different for everyone, and so is recovery. A good nutrition plan is important for your recovery. It can also lower your risk of another stroke. If you have difficulty chewing and swallowing your food, a dietitian or your stroke care team can help so that you can enjoy eating healthy foods. What are tips for following this plan?  Reading food labels  Choose foods that have less than 300 milligrams (mg) of sodium per serving. Limit your sodium intake to less than 1,500 mg per day.  Avoid foods that have saturated fat and trans fat.  Choose foods that are low in cholesterol. Limit the amount of cholesterol you eat each day to less than 200 mg.  Choose foods that are high in fiber. Eat 20-30 grams (g) of fiber each day.  Avoid foods with added sugar. Check the food label for ingredients such as sugar, corn syrup, honey, fructose, molasses, and cane juice. Shopping  At the grocery store, buy most of your food from areas near the walls of the store. This includes: ? Fresh fruits and vegetables. ? Dry grains, beans, nuts, and seeds. ? Fresh seafood, poultry, lean meats, and eggs. ? Low-fat dairy products.  Buy whole ingredients instead of prepackaged foods.  Buy fresh, in-season fruits and vegetables from local farmers markets.  Buy frozen fruits and vegetables in resealable bags. Cooking  Prepare foods with very little salt. Use herbs or salt-free spices instead.  Cook with heart-healthy oils, such as olive, avocado, canola, soybean, or sunflower oil.  Avoid frying foods. Bake, grill, or broil foods instead.  Remove visible fat and skin from meat and poultry before eating.  Modify food textures as told by your health care provider. Meal planning  Eat a wide variety of colorful fruits and vegetables. Make sure one-half of your  plate is filled with fruits and vegetables at each meal.  Eat fruits and vegetables that are high in potassium, such as: ? Apples, bananas, oranges, and melon. ? Sweet potatoes, spinach, zucchini, and tomatoes.  Eat fish that contain heart-healthy fats (omega-3 fats) at least twice a week. These include salmon, tuna, mackerel, and sardines.  Eat plant foods that are high in omega-3 fats, such as flaxseeds and walnuts. Add these to cereals, yogurt, or pasta dishes.  Eat several servings of high-fiber foods each day, such as fruits, vegetables, whole grains, and beans.  Do not put salt at the table for meals.  When eating out at restaurants: ? Ask the server about low-salt or salt-free food options. ? Avoid fried foods. Look for menu items that are grilled, steamed, broiled, or roasted. ? Ask if your food can be prepared without butter. ? Ask for condiments, such as salad dressings, gravy, or sauces to be served on the side.  If you have difficulty swallowing: ? Choose foods that are softer and easier to chew and swallow. ? Cut foods into small pieces and chew well before swallowing. ? Thicken liquids as told by your health care provider or dietitian. ? Let your health care provider know if your condition does not improve over time. You may need to work with a speech therapist to re-train the muscles that are used for eating. General recommendations  Involve your family and friends in your recovery, if possible. It may be helpful to have a slower meal  time and to plan meals that include foods everyone in the family can eat.  Brush your teeth with fluoride toothpaste twice a day, and floss once a day. Keeping a clean mouth can help you swallow and can also help your appetite.  Drink enough water each day to keep your urine pale yellow. If needed, set reminders or ask your family to help you remember to drink water.  Limit alcohol intake to no more than 1 drink a day for nonpregnant  women and 2 drinks a day for men. One drink equals 12 oz of beer, 5 oz of wine, or 1 oz of hard liquor. Summary  Following this eating plan can help in your stroke recovery and can decrease your risk for another stroke.  Let your health care provider know if you have problems with swallowing. You may need to work with a speech therapist. This information is not intended to replace advice given to you by your health care provider. Make sure you discuss any questions you have with your health care provider. Document Revised: 08/10/2018 Document Reviewed: 06/27/2017 Elsevier Patient Education  [place].   Physical Therapy After a Stroke After a stroke, some people experience physical changes or problems. Physical therapy may be prescribed to help you recover and overcome problems such as:  Inability to move (paralysis) or weakness, typically affecting one side of the body.  Trouble with balance.  Pain, a pins and needles sensation, or numbness in certain parts of the body. You may also have difficulty feeling touch, pressure, or changes in temperature.  Involuntary muscle tightening (spasticity).  Stiffness in muscles and joints.  Altered coordination and reflexes. What causes physical disability after a stroke? A stroke can damage parts of your brain that control your body's normal functions, including your ability to move and to keep your balance. The types of physical problems you have will depend on how severe the stroke was and where it was located in the brain. Weakness or paralysis may affect just your fingers and hands, a whole leg or arm, or an entire side of your body. What is physical therapy? Physical therapy involves using exercises, stretches, and activities to help you regain movement and independence after your stroke. Physical therapy may focus on one or more of the following:  Range of motion. This can help with movement and reduce muscle  stiffness.  Balance. This helps to lower your risk of falling.  Position changes or transfers, such as moving from sitting to standing or from a chair to a bed.  Coordination, such as getting an object from a shelf.  Muscle strength. Muscles may be strengthened with weights or by repeating certain motions.  Functional mobility. This may include stair training or learning how to use a wheelchair, walker, or cane.  Walking (gait training).  Activities of daily living, such as getting out of the car or buttoning a shirt. Why is physical therapy important? It is important to do exercises and follow your rehabilitation plan as told by your physical therapist. Physical therapy can:  Help you regain independence.  Prevent injury from falls by building strength and balance.  Lower your risk of blood clots.  Lower your risk of skin sores (pressure injuries).  Increase physical activity and exercise. This may help lower your risk for another stroke.  Help reduce pain. When will therapy start and where will I have therapy? Your health care provider will decide when it is best for you to start therapy.  In some cases, people start rehabilitation, including physical therapy, as soon as they are medically stable, which may be 24-48 hours after a stroke. Rehabilitation can take place in a few different places, based on your needs. It may take place in:  The hospital or an in-patient rehabilitation hospital.  An outpatient rehabilitation facility.  A long-term care facility.  A community rehabilitation clinic.  Your home. What are assistive devices? Assistive devices are tools to help you move, maintain balance, and manage daily tasks while recovering from a stroke. Your physical therapist may recommend and help you learn to use:  Equipment to help you move, such as wheelchairs, canes, or walkers.  Braces or splints to keep your arms, hands, legs, or feet in a comfortable and safe  position.  Bathtub benches or grab bars to keep you safe in the bathroom.  Special utensils, bowls, and plates that allow you to eat with one hand. It is important to use these devices as told by your health care provider. Summary  After a stroke, some people may experience physical disabilities, such as weakness or paralysis, pain, or balance problems.  Physical therapy involves exercises, stretches, and activities that help to improve your ability to move and to handle daily tasks.  Physical therapy exercises focus on restoring range of motion, balance, coordination, muscle strength, and the ability to move (mobility).  Physical therapy can help you regain independence, prevent falls, and allow you to live a more active lifestyle after a stroke. This information is not intended to replace advice given to you by your health care provider. Make sure you discuss any questions you have with your health care provider. Document Revised: 08/10/2018 Document Reviewed: 07/26/2016 Elsevier Patient Education  2020 [place] After a Stroke, Adult A stroke causes damage to the brain cells, which can affect your ability to walk, talk, or remember things. The impact of a stroke is different for everyone, and so is recovery. Some people have progress during the first few days after treatment. Others may take weeks or longer to make progress. Stroke rehabilitation includes a variety of treatments to help you recover and promote your independence after a stroke. You may not be able do everything that you did before the stroke, but you can learn ways to manage your lifestyle and be as independent as possible. Rehabilitation will start as soon as you are able to participate after your stroke, and it involves care from a team that may include:  Family and friends. Your loved ones know you best and can be very helpful in your recovery.  Physicians.  Nurses.  Physical and occupational  therapists.  Speech-language therapists.  A nutritionist.  A psychologist.  A Education officer, museum. Keep open communication with all members of your care team. Share your medical records if needed, and take notes about each provider's recommendations. What is physical therapy? Physical therapists (PTs) help you to improve your coordination, balance, and muscle strength. Physical therapy may involve:  Range of motion exercises.  Help to move between lying, sitting, and standing positions.  Walking with a cane or walker, if needed.  Help using stairs. What is occupational therapy? Occupational therapists (OTs) help you rebuild your ability to do everyday tasks, such as brushing your teeth, going to the bathroom, eating, and getting dressed. Occupational therapy may also help with:  Vision. Visual scanning is a technique that is used to prevent falls.  Memory and cognitive training. This therapy includes problem-solving techniques and  relearning tasks like making a phone call.  Fine muscle movements such as buttoning a shirt or picking up small objects. What is speech therapy? Speech-language therapists help you communicate. After a stroke, you may have problems understanding what people are saying, or you may have trouble writing, speaking, or finding the right word for what you want to say. You may also need speech therapy if you have difficulty swallowing while eating and drinking. Examples of speech-language therapies include:  Techniques to strengthen muscles used in swallowing.  Naming objects or describing pictures. This helps retrain the brain to recognize and remember words.  Exercises to strengthen the muscles involved in talking, including your tongue and lips.  Exercises to retrain your brain in understanding what you read and hear. How often will I need therapy? Therapy will begin as soon as you are able to participate, which is often within the first few days after a  stroke. Sessions will be frequent at first. For example, you may have therapy 2-3 hours a day on most days of the week during the first few months. The intensity depends on the type and severity of your stroke. You may need therapy for several months. Therapy may take place in the hospital, at a rehabilitation center, or in your home. Are there any side effects of therapy? Therapy is safe and is usually well-tolerated. You may feel physically and mentally tired after therapy, especially during the first few weeks. Rest before therapy sessions if you need to so you can get the most out of your rehabilitation. Follow these instructions at home:  Involve your family and friends in your recovery, if possible. Having another person to encourage you is beneficial.  Follow instructions from your speech-language therapist, nutritionist, or health care provider about what you can safely eat and drink. Eat healthy foods. If your ability to swallow was affected by the stroke, you may need to take steps to avoid choking, such as: ? Taking small bites when eating. ? Eating foods that are soft or pureed. ? Drinking liquids that have been thickened.  Maintain social connections and interactions with friends, family, and community groups. This is an important part of your recovery. Communication challenges and physical challenges may cause you to feel isolated after a stroke.  Consider joining a support group that allows you to talk about the impact of stroke on your life. A psychologist or counselor may be recommended. Your emotional recovery from stroke is just as important as your physical recovery.  Keep all follow-up visits as told by your health care providers. This is important. Summary  Stroke rehabilitation includes a variety of treatments to help you recover and promote your independence after a stroke.  Rehabilitation will start as soon as you are able to participate after your stroke, and it  includes care from a team of experts.  The intensity of therapy depends on the type and severity of your stroke. You may need therapy for several months. This information is not intended to replace advice given to you by your health care provider. Make sure you discuss any questions you have with your health care provider. Document Revised: 08/09/2018 Document Reviewed: 04/20/2016 Elsevier Patient Education  2020 [place] on my medicine - Coumadin   (Warfarin)  This medication education was reviewed with me or my healthcare representative as part of my discharge preparation. You were taking this medication prior to this hospital admission.   Why was Coumadin prescribed for you? Coumadin was prescribed  for you because you have a blood clot or a medical condition that can cause an increased risk of forming blood clots. Blood clots can cause serious health problems by blocking the flow of blood to the heart, lung, or brain. Coumadin can prevent harmful blood clots from forming. As a reminder your indication for Coumadin is:   Stroke Prevention Because Of Atrial Fibrillation  What test will check on my response to Coumadin? While on Coumadin (warfarin) you will need to have an INR test regularly to ensure that your dose is keeping you in the desired range. The INR (international normalized ratio) number is calculated from the result of the laboratory test called prothrombin time (PT).  If an INR APPOINTMENT HAS NOT ALREADY BEEN MADE FOR YOU please schedule an appointment to have this lab work done by your health care provider within 7 days. Your INR goal is usually a number between:  2 to 3 or your provider may give you a more narrow range like 2-2.5.  Ask your health care provider during an office visit what your goal INR is.  What  do you need to  know  About  COUMADIN? Take Coumadin (warfarin) exactly as prescribed by your healthcare provider about the same time each day.  DO  NOT stop taking without talking to the doctor who prescribed the medication.  Stopping without other blood clot prevention medication to take the place of Coumadin may increase your risk of developing a new clot or stroke.  Get refills before you run out.  What do you do if you miss a dose? If you miss a dose, take it as soon as you remember on the same day then continue your regularly scheduled regimen the next day.  Do not take two doses of Coumadin at the same time.  Important Safety Information A possible side effect of Coumadin (Warfarin) is an increased risk of bleeding. You should call your healthcare provider right away if you experience any of the following: ? Bleeding from an injury or your nose that does not stop. ? Unusual colored urine (red or dark brown) or unusual colored stools (red or black). ? Unusual bruising for unknown reasons. ? A serious fall or if you hit your head (even if there is no bleeding).  Some foods or medicines interact with Coumadin (warfarin) and might alter your response to warfarin. To help avoid this: ? Eat a balanced diet, maintaining a consistent amount of Vitamin K. ? Notify your provider about major diet changes you plan to make. ? Avoid alcohol or limit your intake to 1 drink for women and 2 drinks for men per day. (1 drink is 5 oz. wine, 12 oz. beer, or 1.5 oz. liquor.)  Make sure that ANY health care provider who prescribes medication for you knows that you are taking Coumadin (warfarin).  Also make sure the healthcare provider who is monitoring your Coumadin knows when you have started a new medication including herbals and non-prescription products.  Coumadin (Warfarin)  Major Drug Interactions  Increased Warfarin Effect Decreased Warfarin Effect  Alcohol (large quantities) Antibiotics (esp. Septra/Bactrim, Flagyl, Cipro) Amiodarone (Cordarone) Aspirin (ASA) Cimetidine (Tagamet) Megestrol (Megace) NSAIDs (ibuprofen, naproxen,  etc.) Piroxicam (Feldene) Propafenone (Rythmol SR) Propranolol (Inderal) Isoniazid (INH) Posaconazole (Noxafil) Barbiturates (Phenobarbital) Carbamazepine (Tegretol) Chlordiazepoxide (Librium) Cholestyramine (Questran) Griseofulvin Oral Contraceptives Rifampin Sucralfate (Carafate) Vitamin K   Coumadin (Warfarin) Major Herbal Interactions  Increased Warfarin Effect Decreased Warfarin Effect  Garlic Ginseng Ginkgo biloba Coenzyme Q10 Green tea St.  John's wort    Coumadin (Warfarin) FOOD Interactions  Eat a consistent number of servings per week of foods HIGH in Vitamin K (1 serving =  cup)  Collards (cooked, or boiled & drained) Kale (cooked, or boiled & drained) Mustard greens (cooked, or boiled & drained) Parsley *serving size only =  cup Spinach (cooked, or boiled & drained) Swiss chard (cooked, or boiled & drained) Turnip greens (cooked, or boiled & drained)  Eat a consistent number of servings per week of foods MEDIUM-HIGH in Vitamin K (1 serving = 1 cup)  Asparagus (cooked, or boiled & drained) Broccoli (cooked, boiled & drained, or raw & chopped) Brussel sprouts (cooked, or boiled & drained) *serving size only =  cup Lettuce, raw (green leaf, endive, romaine) Spinach, raw Turnip greens, raw & chopped   These websites have more information on Coumadin (warfarin):  FailFactory.se; VeganReport.com.au;

## 2019-09-14 NOTE — TOC Transition Note (Addendum)
Transition of Care [facility]) - CM/SW Discharge Note   Patient Details  Name: [name] MRN: [id number] Date of Birth: December 19, 1925  Transition of Care [facility]) CM/SW Contact:  Jacquelynn Cree Phone Number: 09/14/2019, 11:42 AM   Clinical Narrative:    Patient will DC to: [facility] Anticipated DC date: 5/14 Family notified: Spouse Transport by: Corey Harold   Per MD patient ready for DC to [facility]. RN, patient, patient's family, and facility notified of DC. Discharge Summary and FL2 accessed by facility. RN to call report prior to discharge [phone] ext [phone] & ask for [address]). DC packet on chart. Ambulance transport requested for patient.   CSW will sign off for now as social work intervention is no longer needed. Please consult [place] again if new needs arise.    Final next level of care: Skilled Nursing Facility Barriers to Discharge: No Barriers Identified   Patient Goals and CMS Choice   CMS Medicare.gov Compare Post Acute Care list provided to:: Patient Choice offered to / list presented to : Patient  Discharge Placement              Patient chooses bed at: [facility] Patient to be transferred to facility by: [facility] Name of family member notified: [name] Patient and family notified of of transfer: 09/14/19  Discharge Plan and Services In-house Referral: Clinical Social Work Discharge Planning Services: [employer] Consult Post Acute Care Choice: [facility]                               Social Determinants of Health (SDOH) Interventions     Readmission Risk Interventions No flowsheet data found.

## 2019-09-14 NOTE — Assessment & Plan Note (Addendum)
Last BM last night since admitted to hospital 09/09/19 after laxative. Will add Senokot S II po qd.

## 2019-09-14 NOTE — Assessment & Plan Note (Addendum)
S/p pacemaker

## 2019-09-14 NOTE — Assessment & Plan Note (Signed)
Urinary frequency, s/p TURP, s/p urology evaluation, failed many meds

## 2019-09-14 NOTE — Discharge Summary (Signed)
Discharge Summary  AMBROS Mccoy [id number] DOB: 84/24/1927  PCP: [name], MD  Admit date: 09/09/2019 Discharge date: 09/14/2019  Time spent: 35 minutes.  Recommendations for Outpatient Follow-up:  1. Follow-up with neurology 2. Follow-up with cardiology 3. Follow-up with your primary care provider 4. Take your medications as prescribed 5. Continue physical therapy with assistance and fall precautions.  Discharge Diagnoses:  Active Hospital Problems   Diagnosis Date Noted  . Acute left-sided weakness 84/01/2020  . Cerebral embolism with cerebral infarction 09/10/2019  . CVA (cerebral vascular accident) ([place]) 09/10/2019  . AF (paroxysmal atrial fibrillation) ([place]) 09/09/2019  . Hypothyroidism 09/09/2019  . Essential hypertension 09/09/2019  . Chronic systolic CHF (congestive heart failure) ([place]) 09/09/2019  . SSS (sick sinus syndrome) ([place]) 09/09/2019    Resolved Hospital Problems  No resolved problems to display.    Discharge Condition: Stable  Diet recommendation: Please follow speech therapist recommendations: Recommendations  Diet recommendations: Nectar-thick liquid;Dysphagia 2 (fine chop) Liquids provided via: Teaspoon Supervision: Patient able to self feed Compensations: Hard cough after swallow;Small sips/bites Postural Changes and/or Swallow Maneuvers: Seated upright 90 degrees            Oral Care Recommendations: Oral care BID Follow up Recommendations: Skilled Nursing facility SLP Visit Diagnosis: Dysarthria and anarthria (R47.1);Dysphagia, oropharyngeal phase (R13.12) Plan: Dysphagia Treatment      Vitals:   09/14/19 0418 09/14/19 0738  BP: (!) 158/79 (!) 149/90  Pulse: 65 61  Resp: 18 18  Temp: 97.9 F (36.6 C) 98.7 F (37.1 C)  SpO2: 98% 96%    History of present illness:  Past medical history of HTN, hypothyroidism, chronic diastolic CHF, permanent A. fib on Coumadin, traumatic ICH in January 2021, PPM implant.  Patient presented with left-sided weakness found to have acute stroke on MRI.  Seen by neurology/stroke team.  Stroke work-up completed.  PT OT recommended SNF.  Speech therapist with recommendations as stated above.   09/14/19: Seen and examined at his bedside.  No acute events overnight.  Persistent left upper extremity weakness, improving.  He has been exercising his left upper extremity when he lays in bed.   Vital signs and labs are stable.  Hospital Course:  Principal Problem:   Acute left-sided weakness Active Problems:   AF (paroxysmal atrial fibrillation) ([facility])   Hypothyroidism   Essential hypertension   Chronic systolic CHF (congestive heart failure) ([facility])   SSS (sick sinus syndrome) ([facility])   Cerebral embolism with cerebral infarction   CVA (cerebral vascular accident) ([place])  Right MCA infarct most likely embolic secondary to permanent A. fib on warfarin CT head 84 acute abnormality. Small vessel disease. Atrophy. Old scattered lacunes CTA head & neck no large vessel obstruction. L ICA bifurcation 50% stenosis. Moderate multifocal intracranial atherosclerosis.  MRI  R deep insula and radiating white matter infarct. Small vessel disease. Atrophy. Old R frontal white matter infarct. Abnormal flow distal L VA, unrelated. 2D Echo EF 60 to 65%.  No evidence of embolus. 84 77, goal LDL less than 70. 84 HgbA1c 5.5, goal A1c less than 7.0. Warfarin for VTE prophylaxis warfarin daily prior to admission w/ INR 84.0, now on warfarin daily.    Neurology recommended to change warfarin to Eliquis however patient has minimal insurance coverage for Eliquis or any other DOAC's.   Therapy recommendations:  SNF Continue Lipitor 40 mg daily Continue Coumadin Continue PT OT with assistance of palpitations. Follow-up with neurology  Resolved subtherapeutic INR Bridged with full dose aspirin 325 mg daily,  received 1 dose of pharmacological DVT prophylaxis subcu Lovenox on 09/13/2019. INR 84.2 on  09/14/2019. Continue Coumadin Follow-up with your PCP or cardiology to monitor your INR  Permanent A. Fib Rate is currently controlled Continue Toprol-XL at lower dose 12.5 mg daily to avoid hypotension. Continue Coumadin for secondary stroke prevention.   Follow-up with your cardiologist; if you do not have 1, obtain referral from your primary care provider.  Dysphagia in the setting of acute CVA Seen by speech therapist Continue to follow speech therapy recommendations Continue aspiration precautions Recommendations as stated above.  Constipation Continue bowel regimen.  Essential hypertension Gradually normalizing blood pressure Continue Toprol-XL as per above Follow-up with your PCP and neurology  Hyperlipidemia LDL 77, goal of 70 Continue Lipitor 40 mg daily  Polymyalgia rheumatica No acute issues Not on chronic steroid use Follow-up with your PCP  Hypothyroidism TSH 3.939 on 09/12/2019 Continue Synthroid Follow-up with your PCP  History of traumatic intracranial hemorrhage post fall in January 2021 No evidence of intracranial bleed on CT scan   Consultations:  Neurology/stroke team.  Discharge Exam: BP (!) 149/90 (BP Location: Right Arm)   Pulse 61   Temp 98.7 F (37.1 C) (Oral)   Resp 18   Ht 5\' 9"  (1.753 m)   Wt 68 kg   SpO2 96%   BMI 22.15 kg/m  . General: 86 y.o. year-old male well developed well nourished in no acute distress.  Alert and oriented x3. . Cardiovascular: Regular rate and rhythm with no rubs or gallops.  No thyromegaly or JVD noted.   Marland Kitchen Respiratory: Clear to auscultation with no wheezes or rales. Good inspiratory effort. . Abdomen: Soft nontender nondistended with normal bowel sounds x4 quadrants. . Musculoskeletal: No lower extremity edema. 2/4 pulses in all 4 extremities.  Left upper extremity 3 out of 5 strength, improving. Marland Kitchen Psychiatry: Mood is appropriate for condition and setting  Discharge Instructions You were  cared for by a hospitalist during your hospital stay. If you have any questions about your discharge medications or the care you received while you were in the hospital after you are discharged, you can call the unit and asked to speak with the hospitalist on call if the hospitalist that took care of you is not available. Once you are discharged, your primary care physician will handle any further medical issues. Please note that NO REFILLS for any discharge medications will be authorized once you are discharged, as it is imperative that you return to your primary care physician (or establish a relationship with a primary care physician if you do not have one) for your aftercare needs so that they can reassess your need for medications and monitor your lab values.   Allergies as of 09/14/2019   No Known Allergies     Medication List    TAKE these medications    stroke: mapping our early stages of recovery book Misc 1 each by Does not apply route once for 1 dose.   atorvastatin 40 MG tablet Commonly known as: LIPITOR Take 1 tablet (40 mg total) by mouth daily. What changed:   medication strength  how much to take   glucosamine-chondroitin 500-400 MG tablet Take 1 tablet by mouth 3 (three) times daily.   levothyroxine 50 MCG tablet Commonly known as: SYNTHROID Take 50 mcg by mouth daily.   metoprolol succinate 25 MG 24 hr tablet Commonly known as: TOPROL-XL Take 0.5 tablets (12.5 mg total) by mouth daily. What changed:   medication strength  how much to take   multivitamin with minerals Tabs tablet Take 1 tablet by mouth daily.   Resource ThickenUp Clear Powd Take 30 g by mouth as needed.   warfarin 2 MG tablet Commonly known as: COUMADIN Take 2 mg by mouth See admin instructions. Take as directed by [facility] Clinic - Per patient, he takes 1 and 1/2 tablets on mondays + fridays, 2 tablets on all other days. (Verified)      No Known Allergies Follow-up Information     [name], MD. Call in 1 day(s).   Specialty: Family Medicine Why: Please call for a post hospital follow-up appointment. Contact information: [place] [address] [phone]        Garvin Fila, MD. Call in 1 day(s).   Specialties: Neurology, Radiology Why: Please call for a post hospital follow-up appointment. Contact information: [address] [place] [address] [phone]            The results of significant diagnostics from this hospitalization (including imaging, microbiology, ancillary and laboratory) are listed below for reference.    Significant Diagnostic Studies: CT Angio Head W or Wo Contrast  Result Date: 09/10/2019 CLINICAL DATA:  Follow-up examination for acute stroke. EXAM: CT ANGIOGRAPHY HEAD AND NECK TECHNIQUE: Multidetector CT imaging of the head and neck was performed using the standard protocol during bolus administration of intravenous contrast. Multiplanar CT image reconstructions and MIPs were obtained to evaluate the vascular anatomy. Carotid stenosis measurements (when applicable) are obtained utilizing NASCET criteria, using the distal internal carotid diameter as the denominator. CONTRAST:  64mL OMNIPAQUE IOHEXOL 350 MG/ML SOLN COMPARISON:  Prior head CT from earlier the same day. FINDINGS: CT HEAD FINDINGS Brain: Generalized age-related cerebral atrophy with chronic small vessel ischemic disease. Few scatter remote lacunar infarcts noted about the bilateral basal ganglia. Small chronic right cerebellar infarct. No acute intracranial hemorrhage. No acute large vessel territory infarct. No mass lesion, midline shift or mass effect. Mild ventricular prominence related to global parenchymal volume loss without hydrocephalus. No extra-axial fluid collection. Vascular: No hyperdense vessel. Calcified atherosclerosis present at skull base. Skull: Scalp soft tissues and calvarium within normal limits. Sinuses:  Mild chronic mucoperiosteal thickening noted within the ethmoidal air cells. Paranasal sinuses are otherwise clear. No mastoid effusion. Orbits: Globes and orbital soft tissues within normal limits. Review of the MIP images confirms the above findings CTA NECK FINDINGS Aortic arch: Visualized aortic arch of normal caliber with normal 3 vessel morphology. Moderate atherosclerotic change about the arch and origin of the great vessels without hemodynamically significant stenosis. Visualized subclavian arteries patent bilaterally. Right carotid system: Right common carotid artery patent from its origin to the bifurcation without stenosis. Mild scattered calcified plaque about the right bifurcation/proximal right ICA without hemodynamically significant stenosis. Right ICA patent distally to the skull base without stenosis, dissection or occlusion. Left carotid system: Scattered calcified plaque within the left CCA without hemodynamically significant stenosis. Concentric predominantly soft plaque about the left bifurcation with associated stenosis of up to 55% by NASCET criteria. Left ICA patent distally to the skull base without stenosis, dissection or occlusion. Vertebral arteries: Both vertebral arteries arise from the subclavian arteries. Right vertebral artery dominant. Focal plaque at the origin of the right vertebral artery with relatively mild stenosis. Scattered atheromatous irregularity within the vertebral arteries distally without additional stenosis, dissection, or occlusion. Skeleton: No acute osseous abnormality. No discrete or worrisome osseous lesions. Moderate cervical spondylosis present at C4-5 and C5-6. Other  neck: No other acute soft tissue abnormality within the neck. No mass lesion or adenopathy. Upper chest: Visualized upper chest demonstrates no acute finding. Mild centrilobular emphysema. Left-sided pacemaker/AICD in place. Review of the MIP images confirms the above findings CTA HEAD FINDINGS  Anterior circulation: Petrous segments widely patent bilaterally. Scattered calcified plaque throughout the cavernous/supraclinoid ICAs with associated moderate multifocal stenosis. A1 segments patent bilaterally. Normal anterior communicating artery complex. Anterior cerebral arteries patent to their distal aspects without stenosis. Short-segment mild to moderate stenosis noted within the mid right M1 segment (series 12, image 22). M1 segments otherwise patent bilaterally. Normal MCA bifurcations. Distal MCA branches well perfused and symmetric. Diffuse small vessel atheromatous irregularity. Posterior circulation: Multifocal atheromatous irregularity throughout the dominant right V4 segment without high-grade stenosis. Right PICA patent. Multifocal severe near occlusive stenoses seen involving the diminutive left V4 segment, which nearly occludes by the vertebrobasilar junction. Left PICA patent. Moderate irregular stenosis seen involving the proximal basilar artery. Basilar diffusely irregular but otherwise patent to its distal aspect without high-grade stenosis. Superior cerebral arteries patent bilaterally. Predominant fetal type origin of both PCAs supplied via hypoplastic P1 segments and robust bilateral posterior communicating arteries. Right PCA patent to its distal aspect without high-grade stenosis. Short-segment moderate mid left P2 stenosis noted. Left PCA otherwise patent. Venous sinuses: Patent. Anatomic variants: Predominant fetal type origin of the PCAs. No intracranial aneurysm. Review of the MIP images confirms the above findings IMPRESSION: CT HEAD IMPRESSION: 1. Stable head CT.  No acute intracranial abnormality identified. 2. Age-related cerebral atrophy with chronic small vessel ischemic disease with scattered chronic lacunar infarcts as above. CTA HEAD AND NECK IMPRESSION: 1. Negative CTA for large vessel occlusion. 2. 50% atheromatous stenosis about the left carotid bifurcation. 3. Moderate  multifocal atheromatous irregularity throughout the intracranial circulation as above. Notable findings include moderate multifocal stenoses about the carotid siphons, mild to moderate mid right M1 stenosis, multifocal severe near occlusive these for stenoses, irregular moderate stenosis of the proximal basilar artery, with moderate left P2 stenosis. 4.  Emphysema (ICD10-J43.9). Electronically Signed   By: Jeannine Boga M.D.   On: 09/10/2019 01:20   CT HEAD WO CONTRAST  Result Date: 09/12/2019 CLINICAL DATA:  Follow-up stroke. EXAM: CT HEAD WITHOUT CONTRAST TECHNIQUE: Contiguous axial images were obtained from the base of the skull through the vertex without intravenous contrast. COMPARISON:  Sep 10, 2019 and Sep 09, 2019. FINDINGS: Brain: Increased low density is noted in the right insular subcortical white matter consistent with acute infarction as described on prior MRI. No mass effect or midline shift is noted. Ventricular size is within normal limits. No hemorrhage is noted. No definite mass lesion is noted. Vascular: No hyperdense vessel or unexpected calcification. Skull: Normal. Negative for fracture or focal lesion. Sinuses/Orbits: No acute finding. Other: None. IMPRESSION: Increased low density is noted in the right insular subcortical white matter consistent with acute infarction as described on prior MRI. No mass effect or midline shift is noted. No hemorrhage is noted. Electronically Signed   By: Marijo Conception M.D.   On: 09/12/2019 11:43   CT Angio Neck W and/or Wo Contrast  Result Date: 09/10/2019 CLINICAL DATA:  Follow-up examination for acute stroke. EXAM: CT ANGIOGRAPHY HEAD AND NECK TECHNIQUE: Multidetector CT imaging of the head and neck was performed using the standard protocol during bolus administration of intravenous contrast. Multiplanar CT image reconstructions and MIPs were obtained to evaluate the vascular anatomy. Carotid stenosis measurements (when applicable) are  obtained utilizing  NASCET criteria, using the distal internal carotid diameter as the denominator. CONTRAST:  16mL OMNIPAQUE IOHEXOL 350 MG/ML SOLN COMPARISON:  Prior head CT from earlier the same day. FINDINGS: CT HEAD FINDINGS Brain: Generalized age-related cerebral atrophy with chronic small vessel ischemic disease. Few scatter remote lacunar infarcts noted about the bilateral basal ganglia. Small chronic right cerebellar infarct. No acute intracranial hemorrhage. No acute large vessel territory infarct. No mass lesion, midline shift or mass effect. Mild ventricular prominence related to global parenchymal volume loss without hydrocephalus. No extra-axial fluid collection. Vascular: No hyperdense vessel. Calcified atherosclerosis present at skull base. Skull: Scalp soft tissues and calvarium within normal limits. Sinuses: Mild chronic mucoperiosteal thickening noted within the ethmoidal air cells. Paranasal sinuses are otherwise clear. No mastoid effusion. Orbits: Globes and orbital soft tissues within normal limits. Review of the MIP images confirms the above findings CTA NECK FINDINGS Aortic arch: Visualized aortic arch of normal caliber with normal 3 vessel morphology. Moderate atherosclerotic change about the arch and origin of the great vessels without hemodynamically significant stenosis. Visualized subclavian arteries patent bilaterally. Right carotid system: Right common carotid artery patent from its origin to the bifurcation without stenosis. Mild scattered calcified plaque about the right bifurcation/proximal right ICA without hemodynamically significant stenosis. Right ICA patent distally to the skull base without stenosis, dissection or occlusion. Left carotid system: Scattered calcified plaque within the left CCA without hemodynamically significant stenosis. Concentric predominantly soft plaque about the left bifurcation with associated stenosis of up to 55% by NASCET criteria. Left ICA patent  distally to the skull base without stenosis, dissection or occlusion. Vertebral arteries: Both vertebral arteries arise from the subclavian arteries. Right vertebral artery dominant. Focal plaque at the origin of the right vertebral artery with relatively mild stenosis. Scattered atheromatous irregularity within the vertebral arteries distally without additional stenosis, dissection, or occlusion. Skeleton: No acute osseous abnormality. No discrete or worrisome osseous lesions. Moderate cervical spondylosis present at C4-5 and C5-6. Other neck: No other acute soft tissue abnormality within the neck. No mass lesion or adenopathy. Upper chest: Visualized upper chest demonstrates no acute finding. Mild centrilobular emphysema. Left-sided pacemaker/AICD in place. Review of the MIP images confirms the above findings CTA HEAD FINDINGS Anterior circulation: Petrous segments widely patent bilaterally. Scattered calcified plaque throughout the cavernous/supraclinoid ICAs with associated moderate multifocal stenosis. A1 segments patent bilaterally. Normal anterior communicating artery complex. Anterior cerebral arteries patent to their distal aspects without stenosis. Short-segment mild to moderate stenosis noted within the mid right M1 segment (series 12, image 22). M1 segments otherwise patent bilaterally. Normal MCA bifurcations. Distal MCA branches well perfused and symmetric. Diffuse small vessel atheromatous irregularity. Posterior circulation: Multifocal atheromatous irregularity throughout the dominant right V4 segment without high-grade stenosis. Right PICA patent. Multifocal severe near occlusive stenoses seen involving the diminutive left V4 segment, which nearly occludes by the vertebrobasilar junction. Left PICA patent. Moderate irregular stenosis seen involving the proximal basilar artery. Basilar diffusely irregular but otherwise patent to its distal aspect without high-grade stenosis. Superior cerebral  arteries patent bilaterally. Predominant fetal type origin of both PCAs supplied via hypoplastic P1 segments and robust bilateral posterior communicating arteries. Right PCA patent to its distal aspect without high-grade stenosis. Short-segment moderate mid left P2 stenosis noted. Left PCA otherwise patent. Venous sinuses: Patent. Anatomic variants: Predominant fetal type origin of the PCAs. No intracranial aneurysm. Review of the MIP images confirms the above findings IMPRESSION: CT HEAD IMPRESSION: 1. Stable head CT.  No acute intracranial abnormality identified. 2. Age-related  cerebral atrophy with chronic small vessel ischemic disease with scattered chronic lacunar infarcts as above. CTA HEAD AND NECK IMPRESSION: 1. Negative CTA for large vessel occlusion. 2. 50% atheromatous stenosis about the left carotid bifurcation. 3. Moderate multifocal atheromatous irregularity throughout the intracranial circulation as above. Notable findings include moderate multifocal stenoses about the carotid siphons, mild to moderate mid right M1 stenosis, multifocal severe near occlusive these for stenoses, irregular moderate stenosis of the proximal basilar artery, with moderate left P2 stenosis. 4.  Emphysema (ICD10-J43.9). Electronically Signed   By: Jeannine Boga M.D.   On: 09/10/2019 01:20   MR BRAIN WO CONTRAST  Result Date: 09/10/2019 CLINICAL DATA:  Generalized weakness. Mental status changes. Negative CT evaluation. EXAM: MRI HEAD WITHOUT CONTRAST TECHNIQUE: Multiplanar, multiecho pulse sequences of the brain and surrounding structures were obtained without intravenous contrast. COMPARISON:  CT angiography 09/09/2019. FINDINGS: Brain: Diffusion imaging shows a small region of acute infarction affecting the right deep insular cortex in the underlying radiating white matter tracts. No evidence of hemorrhage or mass effect. No other acute infarction. Elsewhere, there are old small vessel ischemic changes of the  pons. Old small vessel cerebellar infarction on the right. Moderate changes of chronic small vessel disease of the cerebral hemispheric white matter. No mass lesion, recent hemorrhage, hydrocephalus or extra-axial collection. Small focus of hemosiderin deposition in the right frontal white matter related to an old insult. Vascular: Flow is present within the major vessels at the base of the brain, with the exception of the left vertebral artery which shows abnormal flow, better demonstrated at CT angiography. Skull and upper cervical spine: Negative Sinuses/Orbits: Clear/normal Other: None IMPRESSION: Acute infarction in the right deep insula and radiating white matter tracts. No mass effect or hemorrhage. Atrophy and chronic small-vessel ischemic changes affecting the brain elsewhere as described. Old right frontal white matter hemorrhage. Abnormal flow in the distal left vertebral artery as shown by CT angiography, presumably unrelated to the acute insult. Electronically Signed   By: Nelson Chimes M.D.   On: 09/10/2019 14:17   DG CHEST PORT 1 VIEW  Result Date: 09/10/2019 CLINICAL DATA:  Adjustment Management of cardiac pacemaker EXAM: PORTABLE CHEST 1 VIEW COMPARISON:  09/09/2019 FINDINGS: There is no focal consolidation. There is no pleural effusion or pneumothorax. The heart and mediastinal contours are unremarkable. There is thoracic aortic atherosclerosis. There is a dual lead cardiac pacemaker with the leads in satisfactory unchanged position on a single frontal AP view. There is no acute osseous abnormality. There is moderate osteoarthritis of the right glenohumeral joint. IMPRESSION: No active disease. Electronically Signed   By: Kathreen Devoid   On: 09/10/2019 11:05   DG Swallowing Func-Speech Pathology  Result Date: 09/12/2019 Objective Swallowing Evaluation: Type of Study: MBS-Modified Barium Swallow Study  Patient Details Name: [name] MRN: [id number] Date of Birth: [birth year] Today's Date:  09/12/2019 Time: SLP Start Time (ACUTE ONLY): 1155 -SLP Stop Time (ACUTE ONLY): 1215 SLP Time Calculation (min) (ACUTE ONLY): 20 min Past Medical History: Past Medical History: Diagnosis Date . Hypertension  . Hypothyroidism  . Presence of permanent cardiac pacemaker  Past Surgical History: No past surgical history on file. HPI: 86 yo male adm to [facility] with weakness, dysarthria and fall with concern for acute CVA.  Pt CT scan head showed chronic lacunar CVAs x2 basal ganglia, small right cerebellar CVA.  MRI showed insular CVA. Concern for worsening symptoms, so new CT has been ordered to r/o extension.  Subjective: Pt upright in  MBS chair, agreeable to study. Assessment / Plan / Recommendation CHL IP CLINICAL IMPRESSIONS 09/12/2019 Clinical Impression Mr. Sparr was seen for a repeat MBS with concern for worsening swallow function compared to two days prior (see MBS note 5/10). Pt does demonstrate a mildly worsened dysphagia (moderate-severe pharyngeal dysphagia) with increased pharyngeal weakness. Oral phase remains mildly impaired and c/b reduced lingual control for bolus cohesion, resulting in trace buccal residue on L. He does not appear sensate to this residue. Pharyngeally, pt also demonstrates reduced sensation with absent swallow trigger noted intermittently with bolus resting in valleculae/pyriform sinus, requiring a cue to swallow. Pt demonstrates further reduced hyoid excursion, laryngeal elevation and pharyngeal constriction, which results in increased residue in pharynx around pyriform sinus, which was noted to spill into airway with subsequent swallows. Pt had trace aspiration noted with thin liquids via spoon/cup, nectar thick liquids via cup/straw, honey thick liquids via cup. Most instances of aspiration were from residue (versus from the initial swallow). He was only noted to sense aspiration x1 with cough response. Pt naturally lowers chin, which greatly worsens the aspiration. With pt being told to  take small sips, he begins well, with use of small sips, but then takes larger sips, resulting in aspiration. Given use of spoon, pt still had some penetration from residue after the swallow, but no aspiration was visualized. He had no penetration/aspiration of purees or ground solids, but does continue with trace-mild residue in valleculae and pyriform sinus. Overall, pt is at high risk for aspiration/aspiration PNA, however, given use of spoon with nectar thick liquids and ground solids, pt was seen with improved performance. D/t high risk of dehydration on this nectar thick (spoon) consistency, recommend he continue with free water protocol after oral care. Water may be consumed by cup. Pt thoroughly educated to continue prior precautions of coughing intermittently and keeping head in neutral position. He demonstrated understanding and agreement. SLP Visit Diagnosis Dysphagia, oropharyngeal phase (R13.12) Impact on safety and function Moderate aspiration risk;Severe aspiration risk   CHL IP TREATMENT RECOMMENDATION 09/10/2019 Treatment Recommendations Defer until completion of intrumental exam   Prognosis 09/12/2019 Prognosis for Safe Diet Advancement Fair Barriers to Reach Goals Severity of deficits CHL IP DIET RECOMMENDATION 09/12/2019 SLP Diet Recommendations Nectar thick liquid;Dysphagia 2 (Fine chop) solids Liquid Administration via Spoon Medication Administration Whole meds with puree Compensations Small sips/bites;Hard cough after swallow Postural Changes Neutral/normal head position   CHL IP OTHER RECOMMENDATIONS 09/12/2019 Recommended Consults n/a Oral Care Recommendations Oral care BID Other Recommendations    CHL IP FOLLOW UP RECOMMENDATIONS 09/12/2019 Follow up Recommendations Skilled Nursing facility   [facility] IP FREQUENCY AND DURATION 09/12/2019 Speech Therapy Frequency (ACUTE ONLY) min 2x/week Treatment Duration 1 week      CHL IP ORAL PHASE 09/12/2019 Oral Phase Impaired Oral - Thin Cup Piecemeal swallowing  Oral - Thin Straw NT Oral - Puree Piecemeal swallowing Oral - Mech Soft Left pocketing in lateral sulci  CHL IP PHARYNGEAL PHASE 09/12/2019 Pharyngeal Phase Impaired Pharyngeal- Honey Cup Delayed swallow initiation-pyriform sinuses;Reduced pharyngeal peristalsis;Reduced epiglottic inversion;Reduced anterior laryngeal mobility;Reduced laryngeal elevation;Reduced airway/laryngeal closure;Reduced tongue base retraction;Penetration/Apiration after swallow;Trace aspiration;Pharyngeal residue - cp segment  Pharyngeal Material enters airway, passes BELOW cords without attempt by patient to eject out (silent aspiration) Pharyngeal- Nectar Teaspoon Delayed swallow initiation-pyriform sinuses;Reduced pharyngeal peristalsis;Reduced epiglottic inversion;Reduced anterior laryngeal mobility;Reduced laryngeal elevation;Reduced airway/laryngeal closure;Reduced tongue base retraction;Penetration/Apiration after swallow;Trace aspiration;Pharyngeal residue - cp segment Pharyngeal Material does not enter airway Pharyngeal- Nectar Cup Delayed swallow initiation-pyriform sinuses;Reduced pharyngeal peristalsis;Reduced  epiglottic inversion;Reduced anterior laryngeal mobility;Reduced laryngeal elevation;Reduced airway/laryngeal closure;Reduced tongue base retraction;Penetration/Apiration after swallow;Trace aspiration;Pharyngeal residue - cp segment Pharyngeal Material enters airway, passes BELOW cords without attempt by patient to eject out (silent aspiration) Pharyngeal- Nectar Straw Delayed swallow initiation-pyriform sinuses;Reduced pharyngeal peristalsis;Reduced epiglottic inversion;Reduced anterior laryngeal mobility;Reduced laryngeal elevation;Reduced airway/laryngeal closure;Reduced tongue base retraction;Penetration/Apiration after swallow;Trace aspiration;Pharyngeal residue - cp segment Pharyngeal Material enters airway, passes BELOW cords without attempt by patient to eject out (silent aspiration) Pharyngeal- Thin Teaspoon  Penetration/Aspiration before swallow Pharyngeal Material enters airway, CONTACTS cords and not ejected out; Material enters airway, passes BELOW cords without attempt by patient to eject out (silent aspiration) Pharyngeal- Thin Cup Pharyngeal residue - cp segment;Pharyngeal residue - pyriform;Trace aspiration;Penetration/Aspiration before swallow;Penetration/Apiration after swallow;Reduced pharyngeal peristalsis;Reduced epiglottic inversion;Reduced anterior laryngeal mobility;Reduced laryngeal elevation;Delayed swallow initiation-pyriform sinuses Pharyngeal Material enters airway, passes BELOW cords without attempt by patient to eject out (silent aspiration) Pharyngeal- Thin Straw NT Pharyngeal Material enters airway, passes BELOW cords without attempt by patient to eject out (silent aspiration) Pharyngeal- Puree Delayed swallow initiation-vallecula;Reduced pharyngeal peristalsis;Reduced tongue base retraction;Pharyngeal residue - valleculae;Pharyngeal residue - pyriform Pharyngeal Material does not enter airway Pharyngeal- Mechanical Soft Reduced epiglottic inversion;Reduced pharyngeal peristalsis;Delayed swallow initiation-vallecula;Pharyngeal residue - pyriform;Pharyngeal residue - valleculae  CHL IP CERVICAL ESOPHAGEAL PHASE 09/10/2019 Cervical Esophageal Phase [facility] P. Isenhour, M.S., CCC-SLP Speech-Language Pathologist Acute Rehabilitation Services Pager: [employer] 09/12/2019, 1:48 PM              DG Swallowing Func-Speech Pathology  Result Date: 09/10/2019 Objective Swallowing Evaluation: Type of Study: MBS-Modified Barium Swallow Study  Patient Details Name: [name] MRN: [id number] Date of Birth: 1925/12/02 Today's Date: 09/10/2019 Time: SLP Start Time (ACUTE ONLY): 0919 -SLP Stop Time (ACUTE ONLY): 0940 SLP Time Calculation (min) (ACUTE ONLY): 21 min Past Medical History: Past Medical History: Diagnosis Date . Hypertension  . Hypothyroidism  . Presence of permanent cardiac  pacemaker  Past Surgical History: No past surgical history on file. HPI: 86 yo male adm to [facility] with weakness, dysarthria and fall with concern for acute CVA. Pt has h/o falling and hitting his head.  Pt CT scan head showed chronic lacunar CVAs x2 basal ganglia, small right cerebellar CVA.  Pt has h/o Bell's palsy 20 years prior, remote smoking/cigar use ceasing 40 years ago.  He failed a Audiological scientist and SLP was ordered. MRI pending/delayed due to pacemaker.  Subjective: pt awake in chair Assessment / Plan / Recommendation CHL IP CLINICAL IMPRESSIONS 09/10/2019 Clinical Impression Patient presentn with moderate oropharyngeal dysphagia with sensorimotor difficulties result in discoordination with premature spillage of boluses into pharynx/larynx before swallow triggered.  Pooling to pyriform sinus noted up to 6 seconds with thin liquids.  Chin tuck posture allowed SILENT aspiration of thin and nectar consistencies - and worsened airway protection.  Control of bolus strictly to small single cup sip prevented aspiration of nectar.  Pt did have very delayed cough after aspirates - large amount of aspiration but did not clear aspirates.  Head turn left did not prevent aspiration nor decrease residuals.  Pt with mild vallecular retention with liquids and puree- Cues to "hock" helpful to decrease x1.  There were also 2 occasions where pt thought he had swallowed and he had not - initiation difficulties noted. Recommend pt have dys3/ground meat/nectar diet and allow thin water between meals after mouth care for hydration/QOL.  Using teach back, educated pt to findings/recommendations with live monitor.  Will follow up for dysphagia management.  SLP Visit Diagnosis Dysphagia, oropharyngeal phase (R13.12) Attention and concentration deficit following -- Frontal lobe and executive function deficit following -- Impact on safety and function Moderate aspiration risk   CHL IP TREATMENT RECOMMENDATION 09/10/2019 Treatment Recommendations  Defer until completion of intrumental exam   Prognosis 09/10/2019 Prognosis for Safe Diet Advancement Good Barriers to Reach Goals -- Barriers/Prognosis Comment -- CHL IP DIET RECOMMENDATION 09/10/2019 SLP Diet Recommendations Dysphagia 3 (Mech soft) solids;Nectar thick liquid Liquid Administration via Spoon;Cup;No straw Medication Administration Whole meds with puree Compensations Slow rate;Small sips/bites;Effortful swallow;Other (Comment) Postural Changes Remain semi-upright after after feeds/meals (Comment);Seated upright at 90 degrees   CHL IP OTHER RECOMMENDATIONS 09/10/2019 Recommended Consults -- Oral Care Recommendations Oral care QID Other Recommendations Order thickener from pharmacy;Have oral suction available   No flowsheet data found.  No flowsheet data found.     CHL IP ORAL PHASE 09/10/2019 Oral Phase Impaired Oral - Pudding Teaspoon -- Oral - Pudding Cup -- Oral - Honey Teaspoon -- Oral - Honey Cup -- Oral - Nectar Teaspoon Delayed oral transit;Decreased bolus cohesion;Premature spillage Oral - Nectar Cup Delayed oral transit;Decreased bolus cohesion;Premature spillage Oral - Nectar Straw Delayed oral transit;Decreased bolus cohesion;Premature spillage Oral - Thin Teaspoon Delayed oral transit;Decreased bolus cohesion;Premature spillage Oral - Thin Cup Delayed oral transit;Decreased bolus cohesion;Premature spillage Oral - Thin Straw Delayed oral transit;Decreased bolus cohesion;Premature spillage Oral - Puree Delayed oral transit;Decreased bolus cohesion;Premature spillage Oral - Mech Soft Delayed oral transit Oral - Regular Decreased velopharyngeal closure;Nasal reflux;Delayed oral transit Oral - Multi-Consistency -- Oral - Pill -- Oral Phase - Comment --  CHL IP PHARYNGEAL PHASE 09/10/2019 Pharyngeal Phase Impaired Pharyngeal- Pudding Teaspoon -- Pharyngeal -- Pharyngeal- Pudding Cup -- Pharyngeal -- Pharyngeal- Honey Teaspoon -- Pharyngeal -- Pharyngeal- Honey Cup -- Pharyngeal -- Pharyngeal- Nectar  Teaspoon Delayed swallow initiation-pyriform sinuses Pharyngeal Material does not enter airway Pharyngeal- Nectar Cup Delayed swallow initiation-pyriform sinuses Pharyngeal Material does not enter airway Pharyngeal- Nectar Straw Delayed swallow initiation-pyriform sinuses;Penetration/Aspiration before swallow;Moderate aspiration Pharyngeal Material enters airway, passes BELOW cords without attempt by patient to eject out (silent aspiration) Pharyngeal- Thin Teaspoon Delayed swallow initiation-vallecula;Pharyngeal residue - valleculae Pharyngeal Material does not enter airway Pharyngeal- Thin Cup Delayed swallow initiation-pyriform sinuses;Reduced anterior laryngeal mobility;Reduced laryngeal elevation;Reduced airway/laryngeal closure;Penetration/Aspiration before swallow Pharyngeal Material enters airway, passes BELOW cords without attempt by patient to eject out (silent aspiration) Pharyngeal- Thin Straw Delayed swallow initiation-pyriform sinuses Pharyngeal Material enters airway, passes BELOW cords without attempt by patient to eject out (silent aspiration) Pharyngeal- Puree Delayed swallow initiation-vallecula;Reduced pharyngeal peristalsis;Reduced epiglottic inversion;Reduced tongue base retraction;Pharyngeal residue - valleculae Pharyngeal -- Pharyngeal- Mechanical Soft Delayed swallow initiation-vallecula Pharyngeal -- Pharyngeal- Regular -- Pharyngeal -- Pharyngeal- Multi-consistency -- Pharyngeal -- Pharyngeal- Pill -- Pharyngeal -- Pharyngeal Comment nectar via cup with head turn left did not prevent aspiration, allowed trace aspiration posterior trach, chin tuck worsened airway protection with gross spillage over epiglottis, During MBS, pt stated "I swallowed" x2 when he had not.  Controlling amount of nectar strictly prevented aspiration but pt at higher risk due to silent nature of aspiration.  Larger liquid bolus faciliated efficient swallow.    CHL IP CERVICAL ESOPHAGEAL PHASE 09/10/2019 Cervical  Esophageal Phase WFL  Upon esophageal sweep, pt appeared clear Pudding Teaspoon -- Pudding Cup -- Honey Teaspoon -- Honey Cup -- Nectar Teaspoon -- Nectar Cup -- Nectar Straw -- Thin Teaspoon -- Thin Cup -- Thin Straw -- Puree -- Mechanical Soft -- Regular -- Multi-consistency -- Pill -- Cervical Esophageal Comment --  Kathleen Lime, MS [facility] SLP Acute Rehab Services Office [phone] [name] 09/10/2019, 10:44 AM              EEG adult  Result Date: 09/11/2019 Lora Havens, MD     09/11/2019 10:23 AM Patient Name: [name] MRN: [id number] Epilepsy Attending: Lora Havens Referring Physician/Provider: Dr. Mitzi Hansen Date: 09/10/2019 Duration: 27.08 mins Patient history: 86 year old male who presented with sudden onset of slurred speech left-sided weakness and difficulty walking.  EEG to evaluate for seizures. Level of alertness: Awake, asleep AEDs during EEG study: None Technical aspects: This EEG study was done with scalp electrodes positioned according to the 10-20 International system of electrode placement. Electrical activity was acquired at a sampling rate of 500Hz  and reviewed with a high frequency filter of 70Hz  and a low frequency filter of 1Hz . EEG data were recorded continuously and digitally stored. Description: During awake state, no clear posterior dominant was seen. Sleep was characterized by vertex waves and intermittent generalized 2 to 3 Hz delta slowing.  EEG also showed generalized polymorphic mixed frequencies with predominantly 8-13 Hz alpha-beta activity. Hyperventilation and photic stimulation were not performed. IMPRESSION: This study is within normal limits. No seizures or epileptiform discharges were seen throughout the recording. Lora Havens   ECHOCARDIOGRAM COMPLETE  Result Date: 09/11/2019    ECHOCARDIOGRAM REPORT   Patient Name:   TAITEN BELISLE Date of Exam: 09/11/2019 Medical Rec #:  [id number]      Height:       69.0 in Accession #:    [id number]      Weight:       150.0 lb Date of Birth:  [birth year]      BSA:          1.828 m Patient Age:    86 years       BP:           159/76 mmHg Patient Gender: M              HR:           68 bpm. Exam Location:  Inpatient Procedure: 2D Echo, Cardiac Doppler and Color Doppler Indications:    Stroke 434.91 / I163.9  History:        Patient has no prior history of Echocardiogram examinations.  Sonographer:    Jonelle Sidle Dance Referring Phys: [id number] [place]  1. Left ventricular ejection fraction, by estimation, is 60 to 65%. The left ventricle has normal function. The left ventricle has no regional wall motion abnormalities. Normal left ventricular annular diastolic velocities are consistent with normal left ventricular relaxation properties and normal left atrial pressure. Diminutive atrial contraction-related velocities are suggestive of recent atrial fibrillation with stunning or left atrial mechanical failure.  2. Right ventricular systolic function is normal. The right ventricular size is normal. Tricuspid regurgitation signal is inadequate for assessing PA pressure.  3. Left atrial size was mildly dilated.  4. The mitral valve is normal in structure. Trivial mitral valve regurgitation.  5. The aortic valve is normal in structure. Aortic valve regurgitation is trivial.  6. Aortic dilatation noted. There is mild dilatation of the ascending aorta measuring 40 mm.  7. The inferior vena cava is normal in size with greater than 50% respiratory variability, suggesting right atrial pressure of 3 mmHg. FINDINGS  Left Ventricle: Left ventricular ejection fraction, by estimation, is 60 to 65%. The left ventricle has normal function. The left ventricle has no regional  wall motion abnormalities. The left ventricular internal cavity size was normal in size. There is  no left ventricular hypertrophy. Normal left ventricular annular diastolic velocities are consistent with normal left ventricular relaxation properties and  normal left atrial pressure. Diminutive atrial contraction-related velocities are suggestive of recent atrial fibrillation with stunning or left atrial mechanical failure. Right Ventricle: The right ventricular size is normal. No increase in right ventricular wall thickness. Right ventricular systolic function is normal. Tricuspid regurgitation signal is inadequate for assessing PA pressure. Left Atrium: Left atrial size was mildly dilated. Right Atrium: Right atrial size was normal in size. Pericardium: A small pericardial effusion is present. The pericardial effusion is circumferential. There is no evidence of cardiac tamponade. Mitral Valve: The mitral valve is normal in structure. Trivial mitral valve regurgitation. Tricuspid Valve: The tricuspid valve is normal in structure. Tricuspid valve regurgitation is not demonstrated. Aortic Valve: The aortic valve is normal in structure. Aortic valve regurgitation is trivial. Pulmonic Valve: The pulmonic valve was not well visualized. Pulmonic valve regurgitation is not visualized. Aorta: The aortic root is normal in size and structure and aortic dilatation noted. There is mild dilatation of the ascending aorta measuring 40 mm. Venous: The inferior vena cava is normal in size with greater than 50% respiratory variability, suggesting right atrial pressure of 3 mmHg. IAS/Shunts: No atrial level shunt detected by color flow Doppler. Additional Comments: A pacer wire is visualized.  LEFT VENTRICLE PLAX 2D LVIDd:         5.49 cm LV PW:         1.22 cm LV IVS:        1.01 cm LVOT diam:     2.10 cm LV SV:         56 LV SV Index:   31 LVOT Area:     3.46 cm  RIGHT VENTRICLE             IVC RV Basal diam:  2.33 cm     IVC diam: 1.54 cm RV S prime:     12.40 cm/s TAPSE (M-mode): 2.1 cm LEFT ATRIUM             Index       RIGHT ATRIUM           Index LA diam:        4.30 cm 2.35 cm/m  RA Area:     14.10 cm LA Vol (A2C):   65.8 ml 35.99 ml/m RA Volume:   31.30 ml  17.12 ml/m  LA Vol (A4C):   55.3 ml 30.25 ml/m LA Biplane Vol: 61.8 ml 33.80 ml/m  AORTIC VALVE LVOT Vmax:   78.00 cm/s LVOT Vmean:  50.950 cm/s LVOT VTI:    0.161 m  AORTA Ao Root diam: 4.00 cm Ao Asc diam:  4.00 cm MITRAL VALVE MV Area (PHT): 1.78 cm    SHUNTS MV Decel Time: 426 msec    Systemic VTI:  0.16 m MV E velocity: 62.10 cm/s  Systemic Diam: 2.10 cm Mihai Croitoru MD Electronically signed by Sanda Klein MD Signature Date/Time: 09/11/2019/10:11:23 AM    Final     Microbiology: Recent Results (from the past 240 hour(s))  Respiratory Panel by RT PCR (Flu A&B, Covid) - Nasopharyngeal Swab     Status: None   Collection Time: 09/09/19 10:50 PM   Specimen: Nasopharyngeal Swab  Result Value Ref Range Status   SARS Coronavirus 2 by RT PCR NEGATIVE NEGATIVE Final    Comment: (NOTE) SARS-CoV-2  target nucleic acids are NOT DETECTED. The SARS-CoV-2 RNA is generally detectable in upper respiratoy specimens during the acute phase of infection. The lowest concentration of SARS-CoV-2 viral copies this assay can detect is 131 copies/mL. A negative result does not preclude SARS-Cov-2 infection and should not be used as the sole basis for treatment or other patient management decisions. A negative result may occur with  improper specimen collection/handling, submission of specimen other than nasopharyngeal swab, presence of viral mutation(s) within the areas targeted by this assay, and inadequate number of viral copies (<131 copies/mL). A negative result must be combined with clinical observations, patient history, and epidemiological information. The expected result is Negative. Fact Sheet for Patients:  PinkCheek.be Fact Sheet for Healthcare Providers:  GravelBags.it This test is not yet ap proved or cleared by the [place] FDA and  has been authorized for detection and/or diagnosis of SARS-CoV-2 by FDA under an Emergency Use Authorization  (EUA). This EUA will remain  in effect (meaning this test can be used) for the duration of the COVID-19 declaration under Section 564(b)(1) of the Act, 21 U.S.C. section 360bbb-3(b)(1), unless the authorization is terminated or revoked sooner.    Influenza A by PCR NEGATIVE NEGATIVE Final   Influenza B by PCR NEGATIVE NEGATIVE Final    Comment: (NOTE) The Xpert Xpress SARS-CoV-2/FLU/RSV assay is intended as an aid in  the diagnosis of influenza from Nasopharyngeal swab specimens and  should not be used as a sole basis for treatment. Nasal washings and  aspirates are unacceptable for Xpert Xpress SARS-CoV-2/FLU/RSV  testing. Fact Sheet for Patients: PinkCheek.be Fact Sheet for Healthcare Providers: GravelBags.it This test is not yet approved or cleared by the [place] FDA and  has been authorized for detection and/or diagnosis of SARS-CoV-2 by  FDA under an Emergency Use Authorization (EUA). This EUA will remain  in effect (meaning this test can be used) for the duration of the  Covid-19 declaration under Section 564(b)(1) of the Act, 21  U.S.C. section 360bbb-3(b)(1), unless the authorization is  terminated or revoked. Performed at [facility], [place] [address], [place], [place] [address]   SARS CORONAVIRUS 2 (TAT 6-24 HRS) Nasopharyngeal Nasopharyngeal Swab     Status: None   Collection Time: 09/12/19 10:51 AM   Specimen: Nasopharyngeal Swab  Result Value Ref Range Status   SARS Coronavirus 2 NEGATIVE NEGATIVE Final    Comment: (NOTE) SARS-CoV-2 target nucleic acids are NOT DETECTED. The SARS-CoV-2 RNA is generally detectable in upper and lower respiratory specimens during the acute phase of infection. Negative results do not preclude SARS-CoV-2 infection, do not rule out co-infections with other pathogens, and should not be used as the sole basis for treatment or other patient management decisions. Negative  results must be combined with clinical observations, patient history, and epidemiological information. The expected result is Negative. Fact Sheet for Patients: SugarRoll.be Fact Sheet for Healthcare Providers: https://www.woods-mathews.com/ This test is not yet approved or cleared by the [place] FDA and  has been authorized for detection and/or diagnosis of SARS-CoV-2 by FDA under an Emergency Use Authorization (EUA). This EUA will remain  in effect (meaning this test can be used) for the duration of the COVID-19 declaration under Section 56 4(b)(1) of the Act, 21 U.S.C. section 360bbb-3(b)(1), unless the authorization is terminated or revoked sooner. Performed at [facility], [place] [address] [place], [place] [address]      Labs: Basic Metabolic Panel: Recent Labs  Lab 09/10/19 [phone] 09/11/19  [id number] 09/12/19 0237 09/13/19 0454 09/14/19 0301  NA 138 139 140 140 141  K 4.3 3.6 4.2 3.9 4.2  CL 106 103 103 106 107  CO2 22 27 26 26 25   GLUCOSE 99 113* 104* 109* 118*  BUN 15 20 28* 33* 33*  CREATININE 0.81 1.13 1.17 1.26* 1.14  CALCIUM 9.0 9.0 8.9 8.8* 8.9   Liver Function Tests: No results for input(s): AST, ALT, ALKPHOS, BILITOT, PROT, ALBUMIN in the last 168 hours. No results for input(s): LIPASE, AMYLASE in the last 168 hours. No results for input(s): AMMONIA in the last 168 hours. CBC: Recent Labs  Lab 09/10/19 0621 09/11/19 0317 09/12/19 0237 09/13/19 0454 09/14/19 0301  WBC 6.2 7.1 6.2 5.6 5.4  HGB 12.8* 13.3 13.1 12.6* 12.9*  HCT 38.9* 40.7 40.5 38.4* 39.3  MCV 92.6 92.9 93.3 93.9 94.0  PLT 127* 135* 124* 128* 133*   Cardiac Enzymes: No results for input(s): CKTOTAL, CKMB, CKMBINDEX, TROPONINI in the last 168 hours. BNP: BNP (last 3 results) No results for input(s): BNP in the last 8760 hours.  ProBNP (last 3 results) No results for input(s): PROBNP in the last 8760 hours.  CBG: No results for  input(s): GLUCAP in the last 168 hours.     Signed:  Kayleen Memos, MD [facility] 09/14/2019, 10:27 AM

## 2019-09-14 NOTE — Assessment & Plan Note (Signed)
LDL 91 10/2018, continue Atorvastatin

## 2019-09-14 NOTE — Assessment & Plan Note (Signed)
Compensated, not taking diuretics. EF 60%

## 2019-09-14 NOTE — Assessment & Plan Note (Addendum)
Stable

## 2019-09-14 NOTE — Assessment & Plan Note (Addendum)
09/10/2019 per MRI, LU5/10/21 MRI IMPRESSION: Acute infarction in the right deep insula and radiating white matter tracts. No mass effect or hemorrhage.E 2/5, LLE 4/5, left face weakness

## 2019-09-14 NOTE — NC FL2 (Signed)
[name] LEVEL OF CARE SCREENING TOOL     IDENTIFICATION  Patient Name: [name] Birthdate: Nov 16, 1925 Sex: male Admission Date (Current Location): 09/09/2019  [facility] and [place] Number:  Herbalist and Address:  [facility], [place] [address], [place], [place] [address]      Provider Number: [id number]  Attending Physician Name and Address:  Gabriel Memos, DO  Relative Name and Phone Number:       Current Level of Care: Hospital Recommended Level of Care: [place] Prior Approval Number:    Date Approved/Denied:   PASRR Number: [id number]  Discharge Plan: SNF    Current Diagnoses: Patient Active Problem List   Diagnosis Date Noted  . Cerebral embolism with cerebral infarction 09/10/2019  . CVA (cerebral vascular accident) ([place]) 09/10/2019  . Acute left-sided weakness 09/09/2019  . AF (paroxysmal atrial fibrillation) ([place]) 09/09/2019  . Hypothyroidism 09/09/2019  . Essential hypertension 09/09/2019  . Chronic systolic CHF (congestive heart failure) ([place]) 09/09/2019  . SSS (sick sinus syndrome) ([place]) 09/09/2019    Orientation RESPIRATION BLADDER Height & Weight     Self, Time, Situation, Place  Normal Incontinent Weight: 150 lb (68 kg) Height:  5\' 9"  (175.3 cm)  BEHAVIORAL SYMPTOMS/MOOD NEUROLOGICAL BOWEL NUTRITION STATUS      Continent Diet(Nectar-thick liquid;Dysphagia 2 (fine chop)Liquids provided via: Teaspoon)  AMBULATORY STATUS COMMUNICATION OF NEEDS Skin   Extensive Assist Verbally Normal                       Personal Care Assistance Level of Assistance  Bathing, Feeding, Dressing Bathing Assistance: Maximum assistance Feeding assistance: Limited assistance Dressing Assistance: Maximum assistance     Functional Limitations Info  Sight, Hearing, Speech Sight Info: Impaired Hearing Info: Impaired Speech Info: Impaired(slur/dysarthria)    SPECIAL CARE FACTORS FREQUENCY  PT  (By licensed PT), OT (By licensed OT)     PT Frequency: 5x a week OT Frequency: 5x a week     Speech Therapy Frequency: 5x a week      Contractures Contractures Info: Not present    Additional Factors Info  Code Status, Allergies Code Status Info: FULL Allergies Info: NKA           Current Medications (09/14/2019):     Discharge Medications: stroke: mapping our early stages of recovery book Misc 1 each by Does not apply route once for 1 dose.   atorvastatin 40 MG tablet Commonly known as: LIPITOR Take 1 tablet (40 mg total) by mouth daily. What changed:   medication strength  how much to take   glucosamine-chondroitin 500-400 MG tablet Take 1 tablet by mouth 3 (three) times daily.   levothyroxine 50 MCG tablet Commonly known as: SYNTHROID Take 50 mcg by mouth daily.   metoprolol succinate 25 MG 24 hr tablet Commonly known as: TOPROL-XL Take 0.5 tablets (12.5 mg total) by mouth daily. What changed:   medication strength  how much to take   multivitamin with minerals Tabs tablet Take 1 tablet by mouth daily.   Resource ThickenUp Clear Powd Take 30 g by mouth as needed.   warfarin 2 MG tablet Commonly known as: COUMADIN Take 2 mg by mouth See admin instructions. Take as directed by [facility] - Per patient, he takes 1 and 1/2 tablets on mondays + fridays, 2 tablets on all other days. (Verified)     Relevant Imaging Results:  Relevant Lab Results:   Additional  Information SSN [id number]  [name], [place]

## 2019-09-14 NOTE — Progress Notes (Signed)
[place] for warfarin Indication: atrial fibrillation  No Known Allergies  Patient Measurements: Height: 5\' 9"  (175.3 cm) Weight: 68 kg (150 lb) IBW/kg (Calculated) : 70.7   Vital Signs: Temp: 98.7 F (37.1 C) (05/14 0738) Temp Source: Oral (05/14 0738) BP: 149/90 (05/14 0738) Pulse Rate: 61 (05/14 0738)  Labs: Recent Labs    09/12/19 0237 09/12/19 0237 09/13/19 0454 09/13/19 1815 09/14/19 0301  HGB 13.1   < > 12.6*  --  12.9*  HCT 40.5  --  38.4*  --  39.3  PLT 124*  --  128*  --  133*  LABPROT 20.2*   < > 19.0* 20.8* 23.3*  INR 1.8*   < > 1.7* 1.9* 2.2*  CREATININE 1.17  --  1.26*  --  1.14   < > = values in this interval not displayed.    Estimated Creatinine Clearance: 40.6 mL/min (by C-G formula based on SCr of 1.14 mg/dL).   Medical History: Past Medical History:  Diagnosis Date  . Hypertension   . Hypothyroidism   . Presence of permanent cardiac pacemaker      Assessment: 40 yoM with hx AFib on warfarin admitted with slurred speech and found to have CVA. Pharmacy to transition to [place] -CT head, MRI no acute bleeding.  -INR = 1.8 , H/H slight decrease 12.6/38.4. PLTC 124k stable - No bleeding reported. Neuro had wanted to switch to eliquis but copay too high $ 484.72-(also can't afford xarelto, pradaxa  per Neuro) > Neuro said change back to warfarin and bridge with Aspirin 325 mg daily until INR >2.  Dr. [place] Crane ordered pharmacy consult for Lovenox bridge. I have discussed this with Dr. [place] Crane.  Full dose lovenox bridge not usually recommended in patient with CVA. Dr. Leonie Man had recommended to give Aspirin bridge until INR > 2 instead of IV heparin bridge. Started Aspirin 325 mg daily yesterday 5/12.  Therefore Dr. [place] Crane wants to continue the aspirin bridge until INR >2 and give Lovenox 40mg  SQ x1 only today.    **PTA dose (per other chart) is 3mg  MF, 4mg  AODs, last dose 5/9 pta  INR in range today at 2.2. Hgb  stable at 12.9. Chronic low plts at 128k.   Goal of Therapy:  INR 2-3 Monitor platelets by anticoagulation protocol: Yes   Plan:  -Warfarin 5 mg today -Daily PT/INR ASA 325 mg daily until INR >2  Onnie Boer, PharmD, BCIDP, AAHIVP, CPP Infectious Disease Pharmacist 09/14/2019 9:04 AM

## 2019-09-14 NOTE — Plan of Care (Signed)
Adequate for discharge.

## 2019-09-17 ENCOUNTER — Encounter: Payer: Self-pay | Admitting: Nurse Practitioner

## 2019-09-17 DIAGNOSIS — R1310 Dysphagia, unspecified: Secondary | ICD-10-CM | POA: Insufficient documentation

## 2019-09-17 NOTE — Assessment & Plan Note (Signed)
ST, Nectar.

## 2019-09-18 ENCOUNTER — Telehealth: Payer: Self-pay | Admitting: Internal Medicine

## 2019-09-18 ENCOUNTER — Encounter: Payer: Self-pay | Admitting: Nurse Practitioner

## 2019-09-18 NOTE — Telephone Encounter (Signed)
Almyra Free from [facility] is calling wanting to know how soon Dr. Caryl Comes is wanting the patient to come in for a hospital f/u. Please advise.

## 2019-09-18 NOTE — Telephone Encounter (Signed)
Called and spoke with the pts wife

## 2019-09-19 ENCOUNTER — Encounter: Payer: Self-pay | Admitting: Internal Medicine

## 2019-09-19 ENCOUNTER — Non-Acute Institutional Stay (SKILLED_NURSING_FACILITY): Payer: Medicare Other | Admitting: Internal Medicine

## 2019-09-19 DIAGNOSIS — I4891 Unspecified atrial fibrillation: Secondary | ICD-10-CM | POA: Diagnosis not present

## 2019-09-19 DIAGNOSIS — E039 Hypothyroidism, unspecified: Secondary | ICD-10-CM | POA: Diagnosis not present

## 2019-09-19 DIAGNOSIS — K5901 Slow transit constipation: Secondary | ICD-10-CM | POA: Diagnosis not present

## 2019-09-19 DIAGNOSIS — R1312 Dysphagia, oropharyngeal phase: Secondary | ICD-10-CM | POA: Diagnosis not present

## 2019-09-19 DIAGNOSIS — E785 Hyperlipidemia, unspecified: Secondary | ICD-10-CM

## 2019-09-19 DIAGNOSIS — I10 Essential (primary) hypertension: Secondary | ICD-10-CM | POA: Diagnosis not present

## 2019-09-19 DIAGNOSIS — I639 Cerebral infarction, unspecified: Secondary | ICD-10-CM | POA: Diagnosis not present

## 2019-09-19 NOTE — Progress Notes (Signed)
Provider:   Location:  [facility] Room Number: 56 Place of Service:  SNF (31)  PCP: Gabriel Dad, MD Patient Care Team: Gabriel Dad, MD as PCP - General (Internal Medicine) Gabriel Cantor, MD (Ophthalmology) Gabriel Balding, MD (Orthopedic Surgery) Gabriel Sprang, MD (Cardiology) Gabriel Skates, MD (General Surgery) Gabriel Post, MD [facility])  Extended Emergency Contact Information Primary Emergency Contact: [name],[name] Address: [place],  [address] [name] of [place] Phone: [phone] Mobile Phone: [phone] Relation: Spouse Secondary Emergency Contact: [name], [name] Home Phone: [phone] Relation: Spouse  Code Status:  Goals of Care: Advanced Directive information Advanced Directives 09/19/2019  Does Patient Have a Medical Advance Directive? Yes  Type of Paramedic of [place];Living will  Does patient want to make changes to medical advance directive? No - Patient declined  Copy of [place] in Chart? Yes - validated most recent copy scanned in chart (See row information)  Would patient like information on creating a medical advance directive? -      Chief Complaint  Patient presents with  . New Admit To SNF    Admission    HPI: Patient is a 86 y.o. male seen today for admission to SNF for therapy.  Was in the hospital for Acute Right MCA infarct with left Sided weakness from 5/9-5/14  Patient has past medical history of hypertension, hypothyroidism, chronic diastolic CHF, permanent A. fib on Coumadin, traumatic ICH in January 2021 and Colles fracture of right radius, PPM implant.,  History of PMR, hyperlipidemia  Patient presented to emergency room for evaluation of weakness slurred speech and difficulty in walking.  MRI scan showed right subcortical infarct most likely embolic etiology.  His INR on admission was 2.2.  CT  angiogram shows 50% left ICA stenosis. Initial plan was to change his warfarin to Eliquis bed due to the cost he is back on warfarin. Was evaluated by speech and they recommended nectar thick dysphagia to in small bites sitting upright Patient is now in SNF for rehab His only complaint was constipation.  Otherwise he is doing well Trying to adjust to the modified diet.  Has already started working with speech therapy.  Was very active before stroke.  Lives with his wife in [place].  Was driving    Past Medical History:  Diagnosis Date  . AF (atrial fibrillation) [facility])    a. s/p RFCA/PVI @ [facility] 2007.  . Arthritis    "joints" (06/21/2013)  . Atrial flutter ([place])    a. 06/2013 s/p TEE/DCCV  . Chronic systolic CHF (congestive heart failure) ([place])    a. 06/2013 Echo: EF 20%, diff HK, mild LVH.  Marland Kitchen History of cardiovascular stress test    Lexiscan Myoview 8/16:  EF 59%, attenuation artifact, no ischemia; Low Risk  . Hyperlipidemia   . Hypertension   . HYPERTENSION, BENIGN 08/21/2009   Qualifier: Diagnosis of  By: Caryl Comes, MD, Remus Blake  Medications(s)  enalapril   . Hypothyroidism   . Neoplasm of uncertain behavior    SKIN  . Polymyalgia rheumatica ([place]) 01/13/2007   Qualifier: History of  By: Danny Lawless CMA, [place]     . Presence of permanent cardiac pacemaker    Past Surgical History:  Procedure Laterality Date  . ATRIAL FIBRILLATION ABLATION  2007  . CARDIOVERSION N/A 06/22/2013   Procedure: CARDIOVERSION;  Surgeon: Sueanne Margarita, MD;  Location: [facility] ENDOSCOPY;  Service: Cardiovascular;  Laterality: N/A;  15:17 Lido 20mg ,IV , Propofol 30 mg, IV synched cardioversion @ 150 joules by Dr. Lajoyce Lauber from a flutter to sinis brady which is baseline for this pt, reports 45-50 reg HR verified by Dr. Ashok Norris  . CARDIOVERSION N/A 11/21/2014   Procedure: CARDIOVERSION;  Surgeon: Fay Records, MD;  Location: [facility] ENDOSCOPY;  Service: Cardiovascular;  Laterality: N/A;  . CARDIOVERSION N/A  07/14/2015   Procedure: CARDIOVERSION;  Surgeon: Sueanne Margarita, MD;  Location: [place];  Service: Cardiovascular;  Laterality: N/A;  . CATARACT EXTRACTION, BILATERAL Bilateral Summer 2009  . CHOLECYSTECTOMY    . EP IMPLANTABLE DEVICE N/A 11/10/2015   Procedure: Pacemaker Implant;  Surgeon: Gabriel Sprang, MD;  Location: [place] CV LAB;  Service: Cardiovascular;  Laterality: N/A;  . HAND SURGERY Right    "broke bone; grafted area w/bone from somewhere else"  . HEMORRHOID SURGERY    . INGUINAL HERNIA REPAIR Bilateral   . TEE WITHOUT CARDIOVERSION N/A 06/22/2013   Procedure: TRANSESOPHAGEAL ECHOCARDIOGRAM (TEE);  Surgeon: Sueanne Margarita, MD;  Location: [facility] ENDOSCOPY;  Service: Cardiovascular;  Laterality: N/A;  . TEE WITHOUT CARDIOVERSION N/A 07/14/2015   Procedure: TRANSESOPHAGEAL ECHOCARDIOGRAM (TEE);  Surgeon: Sueanne Margarita, MD;  Location: [facility] ENDOSCOPY;  Service: Cardiovascular;  Laterality: N/A;  . TONSILLECTOMY      reports that he quit smoking about 56 years ago. His smoking use included cigarettes. He has a 12.00 pack-year smoking history. He has never used smokeless tobacco. He reports current alcohol use. He reports that he does not use drugs. Social History   Socioeconomic History  . Marital status: Married    Spouse name: [name]  . Number of children: 3  . Years of education: 59  . Highest education level: Not on file  Occupational History  . Occupation: RETIRED Administrator, sports: RETIRED    Comment: Still active w/stocks  . Occupation: RETIRED    Employer: RETIRED  Tobacco Use  . Smoking status: Former Smoker    Packs/day: 1.00    Years: 12.00    Pack years: 12.00    Types: Cigarettes    Quit date: 05/04/1963    Years since quitting: 56.4  . Smokeless tobacco: Never Used  . Tobacco comment: quit cigars 25'   Substance and Sexual Activity  . Alcohol use: Yes    Comment: 06/21/2013 "I was a moderate drinker at most; rarely have drink now"  . Drug use: No    . Sexual activity: Never    Partners: Female  Other Topics Concern  . Not on file  Social History Narrative   ** Merged History Encounter **       [employer], BA,  MBA, Chiropodist. Married 1958. 2 -girls '59, '62; 1 son '64; 2 grandchildren. Retired Customer service manager. END of LIFE CARE: Yes - DNR, yes - for short term mechanical ventilation.has a living will, would not want any heroic/futile care.  Wife sustained fracture proximal humerus left - Sept '11   Social Determinants of Health   Financial Resource Strain:   . Difficulty of Paying Living Expenses:   Food Insecurity:   . Worried About Charity fundraiser in the Last Year:   . Arboriculturist in the Last Year:   Transportation Needs:   . Film/video editor (Medical):   Marland Kitchen Lack of Transportation (Non-Medical):   Physical Activity:   . Days of Exercise per  Week:   . Minutes of Exercise per Session:   Stress:   . Feeling of Stress :   Social Connections:   . Frequency of Communication with Friends and Family:   . Frequency of Social Gatherings with Friends and Family:   . Attends Religious Services:   . Active Member of Clubs or Organizations:   . Attends Archivist Meetings:   Marland Kitchen Marital Status:   Intimate Partner Violence:   . Fear of Current or Ex-Partner:   . Emotionally Abused:   Marland Kitchen Physically Abused:   . Sexually Abused:     Functional Status Survey:    Family History  Problem Relation Age of Onset  . Emphysema Mother   . Diabetes Mother   . Kidney disease Father     Health Maintenance  Topic Date Due  . INFLUENZA VACCINE  12/02/2019  . TETANUS/TDAP  05/05/2029  . COVID-19 Vaccine  Completed  . PNA vac Low Risk Adult  Completed    No Known Allergies  Outpatient Encounter Medications as of 09/19/2019  Medication Sig  . atorvastatin (LIPITOR) 40 MG tablet Take 40 mg by mouth daily.  Marland Kitchen glucosamine-chondroitin 500-400 MG tablet Take 1 tablet by mouth 3 (three) times daily.   Marland Kitchen levothyroxine (SYNTHROID) 50 MCG tablet Take 1 tablet (50 mcg total) by mouth daily before breakfast.  . metoprolol succinate (TOPROL-XL) 25 MG 24 hr tablet Take 12.5 mg by mouth daily.  . Multiple Vitamin (MULTIVITAMIN WITH MINERALS) TABS tablet Take 1 tablet by mouth daily. Centrum  . warfarin (COUMADIN) 2 MG tablet Take 4 mg by mouth. 2 tablets on Tues, Wed, Thurs, Sat, and Sun.  . warfarin (COUMADIN) 3 MG tablet Take 3 mg by mouth daily. Once a day on Monday and Friday  . [DISCONTINUED] atorvastatin (LIPITOR) 40 MG tablet Take 1 tablet (40 mg total) by mouth daily.  . [DISCONTINUED] Glucosamine-Chondroitin 500-400 MG CAPS Take 1 capsule by mouth in the morning, at noon, and at bedtime.   . [DISCONTINUED] levothyroxine (SYNTHROID) 50 MCG tablet Take 50 mcg by mouth daily.  . [DISCONTINUED] Maltodextrin-Xanthan Gum (RESOURCE THICKENUP CLEAR) POWD Take 30 g by mouth as needed.  . [DISCONTINUED] metoprolol succinate (TOPROL-XL) 25 MG 24 hr tablet Take 0.5 tablets (12.5 mg total) by mouth daily.  . [DISCONTINUED] Multiple Vitamin (MULTIVITAMIN WITH MINERALS) TABS tablet Take 1 tablet by mouth daily.  . [DISCONTINUED] warfarin (COUMADIN) 2 MG tablet Take 2 mg by mouth. Take 1 and 1/2 tablets on Mondays and Fridays.   . [DISCONTINUED] warfarin (COUMADIN) 2 MG tablet Take 2 mg by mouth See admin instructions. Take as directed by Coumadin Clinic - Per patient, he takes 1 and 1/2 tablets on mondays + fridays, 2 tablets on all other days. (Verified)   No facility-administered encounter medications on file as of 09/19/2019.    Review of Systems  Constitutional: Positive for activity change and appetite change.  HENT: Negative.   Respiratory: Positive for cough.   Gastrointestinal: Positive for constipation.  Genitourinary: Negative.   Musculoskeletal: Positive for gait problem.  Skin: Negative.   Neurological: Positive for speech difficulty and weakness.  Psychiatric/Behavioral: Negative.      Vitals:   09/19/19 1033  BP: 139/90  Pulse: 64  Resp: 16  Temp: (!) 97.1 F (36.2 C)  SpO2: 96%  Weight: 147 lb 1.6 oz (66.7 kg)  Height: 5\' 9"  (1.753 m)   Body mass index is 21.72 kg/m. Physical Exam Vitals reviewed.  Constitutional:  Appearance: Normal appearance.  HENT:     Head: Normocephalic.     Nose: Nose normal.     Mouth/Throat:     Mouth: Mucous membranes are moist.     Pharynx: Oropharynx is clear.  Eyes:     Pupils: Pupils are equal, round, and reactive to light.  Cardiovascular:     Rate and Rhythm: Normal rate. Rhythm irregular.     Pulses: Normal pulses.  Pulmonary:     Effort: Pulmonary effort is normal. No respiratory distress.     Breath sounds: No wheezing or rales.  Abdominal:     General: Abdomen is flat. Bowel sounds are normal.     Palpations: Abdomen is soft.  Musculoskeletal:        General: No swelling.     Cervical back: Neck supple.  Skin:    General: Skin is warm.  Neurological:     Mental Status: He is alert and oriented to person, place, and time.     Comments: Left Facial Droop Left Hand 3-4/5 Left Leg 3/5 Right UE and LE was Normal Strength  Psychiatric:        Mood and Affect: Mood normal.        Thought Content: Thought content normal.     Labs reviewed: Basic Metabolic Panel: Recent Labs    09/09/19 1138 09/10/19 0621 09/12/19 0237 09/13/19 0454 09/14/19 0301  NA 141   < > 140 140 141  K 4.5   < > 4.2 3.9 4.2  CL 104   < > 103 106 107  CO2 28   < > 26 26 25   GLUCOSE 104*   < > 104* 109* 118*  BUN 16   < > 28* 33* 33*  CREATININE 0.98   < > 1.17 1.26* 1.14  CALCIUM 9.3   < > 8.9 8.8* 8.9  MG 2.0  --   --   --   --    < > = values in this interval not displayed.   Liver Function Tests: Recent Labs    10/04/18 0834 05/07/19 0512 09/09/19 1138  AST 16 31 21   ALT 5 8 10   ALKPHOS 76 59 72  BILITOT 0.8 1.3* 1.0  PROT 6.9 6.0* 6.6  ALBUMIN 4.3 3.4* 3.8   No results for input(s): LIPASE, AMYLASE  in the last 8760 hours. No results for input(s): AMMONIA in the last 8760 hours. CBC: Recent Labs    10/04/18 0834 10/04/18 0834 05/06/19 1320 05/07/19 0512 09/09/19 1138 09/10/19 0621 09/12/19 0237 09/13/19 0454 09/14/19 0301  WBC 5.1   < > 7.8   < > 4.2   < > 6.2 5.6 5.4  NEUTROABS 3.4  --  6.5  --  3.0  --   --   --   --   HGB 13.6   < > 13.6   < > 13.4   < > 13.1 12.6* 12.9*  HCT 40.1   < > 42.6   < > 42.2   < > 40.5 38.4* 39.3  MCV 91.9   < > 96.4   < > 95.3   < > 93.3 93.9 94.0  PLT 157.0   < > 155   < > 134*   < > 124* 128* 133*   < > = values in this interval not displayed.   Cardiac Enzymes: No results for input(s): CKTOTAL, CKMB, CKMBINDEX, TROPONINI in the last 8760 hours. BNP: Invalid input(s): POCBNP Lab Results  Component Value Date  HGBA1C 5.5 09/10/2019   Lab Results  Component Value Date   TSH 3.939 09/12/2019   Lab Results  Component Value Date   [id number] 09/10/2019   No results found for: FOLATE No results found for: IRON, TIBC, FERRITIN  Imaging and Procedures obtained prior to SNF admission: CT Angio Head W or Wo Contrast  Result Date: 09/10/2019 CLINICAL DATA:  Follow-up examination for acute stroke. EXAM: CT ANGIOGRAPHY HEAD AND NECK TECHNIQUE: Multidetector CT imaging of the head and neck was performed using the standard protocol during bolus administration of intravenous contrast. Multiplanar CT image reconstructions and MIPs were obtained to evaluate the vascular anatomy. Carotid stenosis measurements (when applicable) are obtained utilizing NASCET criteria, using the distal internal carotid diameter as the denominator. CONTRAST:  43mL OMNIPAQUE IOHEXOL 350 MG/ML SOLN COMPARISON:  Prior head CT from earlier the same day. FINDINGS: CT HEAD FINDINGS Brain: Generalized age-related cerebral atrophy with chronic small vessel ischemic disease. Few scatter remote lacunar infarcts noted about the bilateral basal ganglia. Small chronic right  cerebellar infarct. No acute intracranial hemorrhage. No acute large vessel territory infarct. No mass lesion, midline shift or mass effect. Mild ventricular prominence related to global parenchymal volume loss without hydrocephalus. No extra-axial fluid collection. Vascular: No hyperdense vessel. Calcified atherosclerosis present at skull base. Skull: Scalp soft tissues and calvarium within normal limits. Sinuses: Mild chronic mucoperiosteal thickening noted within the ethmoidal air cells. Paranasal sinuses are otherwise clear. No mastoid effusion. Orbits: Globes and orbital soft tissues within normal limits. Review of the MIP images confirms the above findings CTA NECK FINDINGS Aortic arch: Visualized aortic arch of normal caliber with normal 3 vessel morphology. Moderate atherosclerotic change about the arch and origin of the great vessels without hemodynamically significant stenosis. Visualized subclavian arteries patent bilaterally. Right carotid system: Right common carotid artery patent from its origin to the bifurcation without stenosis. Mild scattered calcified plaque about the right bifurcation/proximal right ICA without hemodynamically significant stenosis. Right ICA patent distally to the skull base without stenosis, dissection or occlusion. Left carotid system: Scattered calcified plaque within the left CCA without hemodynamically significant stenosis. Concentric predominantly soft plaque about the left bifurcation with associated stenosis of up to 55% by NASCET criteria. Left ICA patent distally to the skull base without stenosis, dissection or occlusion. Vertebral arteries: Both vertebral arteries arise from the subclavian arteries. Right vertebral artery dominant. Focal plaque at the origin of the right vertebral artery with relatively mild stenosis. Scattered atheromatous irregularity within the vertebral arteries distally without additional stenosis, dissection, or occlusion. Skeleton: No acute  osseous abnormality. No discrete or worrisome osseous lesions. Moderate cervical spondylosis present at C4-5 and C5-6. Other neck: No other acute soft tissue abnormality within the neck. No mass lesion or adenopathy. Upper chest: Visualized upper chest demonstrates no acute finding. Mild centrilobular emphysema. Left-sided pacemaker/AICD in place. Review of the MIP images confirms the above findings CTA HEAD FINDINGS Anterior circulation: Petrous segments widely patent bilaterally. Scattered calcified plaque throughout the cavernous/supraclinoid ICAs with associated moderate multifocal stenosis. A1 segments patent bilaterally. Normal anterior communicating artery complex. Anterior cerebral arteries patent to their distal aspects without stenosis. Short-segment mild to moderate stenosis noted within the mid right M1 segment (series 12, image 22). M1 segments otherwise patent bilaterally. Normal MCA bifurcations. Distal MCA branches well perfused and symmetric. Diffuse small vessel atheromatous irregularity. Posterior circulation: Multifocal atheromatous irregularity throughout the dominant right V4 segment without high-grade stenosis. Right PICA patent. Multifocal severe near occlusive stenoses seen involving the diminutive  left V4 segment, which nearly occludes by the vertebrobasilar junction. Left PICA patent. Moderate irregular stenosis seen involving the proximal basilar artery. Basilar diffusely irregular but otherwise patent to its distal aspect without high-grade stenosis. Superior cerebral arteries patent bilaterally. Predominant fetal type origin of both PCAs supplied via hypoplastic P1 segments and robust bilateral posterior communicating arteries. Right PCA patent to its distal aspect without high-grade stenosis. Short-segment moderate mid left P2 stenosis noted. Left PCA otherwise patent. Venous sinuses: Patent. Anatomic variants: Predominant fetal type origin of the PCAs. No intracranial aneurysm. Review  of the MIP images confirms the above findings IMPRESSION: CT HEAD IMPRESSION: 1. Stable head CT.  No acute intracranial abnormality identified. 2. Age-related cerebral atrophy with chronic small vessel ischemic disease with scattered chronic lacunar infarcts as above. CTA HEAD AND NECK IMPRESSION: 1. Negative CTA for large vessel occlusion. 2. 50% atheromatous stenosis about the left carotid bifurcation. 3. Moderate multifocal atheromatous irregularity throughout the intracranial circulation as above. Notable findings include moderate multifocal stenoses about the carotid siphons, mild to moderate mid right M1 stenosis, multifocal severe near occlusive these for stenoses, irregular moderate stenosis of the proximal basilar artery, with moderate left P2 stenosis. 4.  Emphysema (ICD10-J43.9). Electronically Signed   By: Jeannine Boga M.D.   On: 09/10/2019 01:20   CT Head Wo Contrast  Result Date: 09/09/2019 CLINICAL DATA:  Encephalopathy. Additional history provided: Generalized weakness, poor appetite, increased urinary frequency, bladder incontinence. Patient's wife reports slurred speech since Friday and confusion. Patient reports weakness. EXAM: CT HEAD WITHOUT CONTRAST TECHNIQUE: Contiguous axial images were obtained from the base of the skull through the vertex without intravenous contrast. COMPARISON:  Noncontrast head CT 05/07/2019 FINDINGS: Brain: Ill-defined hypoattenuation within the cerebral white matter is nonspecific, but consistent with chronic small vessel ischemic disease. Redemonstrated chronic lacunar infarcts within the left caudate head and right cerebellum. Moderate generalized parenchymal atrophy. There is no acute intracranial hemorrhage. No demarcated cortical infarct. No extra-axial fluid collection. No evidence of intracranial mass. No midline shift. Vascular: No hyperdense vessel.  Atherosclerotic calcifications. Skull: Normal. Negative for fracture or focal lesion.  Sinuses/Orbits: Visualized orbits show no acute finding. Minimal ethmoid and maxillary sinus mucosal thickening. No significant mastoid effusion. IMPRESSION: 1. No evidence of acute intracranial abnormality. 2. Stable generalized parenchymal atrophy and chronic small vessel ischemic disease. Redemonstrated chronic lacunar infarcts within the left caudate head and right cerebellum. 3. Minimal ethmoid and maxillary sinus mucosal thickening. Electronically Signed   By: Kellie Simmering DO   On: 09/09/2019 11:33   CT Angio Neck W and/or Wo Contrast  Result Date: 09/10/2019 CLINICAL DATA:  Follow-up examination for acute stroke. EXAM: CT ANGIOGRAPHY HEAD AND NECK TECHNIQUE: Multidetector CT imaging of the head and neck was performed using the standard protocol during bolus administration of intravenous contrast. Multiplanar CT image reconstructions and MIPs were obtained to evaluate the vascular anatomy. Carotid stenosis measurements (when applicable) are obtained utilizing NASCET criteria, using the distal internal carotid diameter as the denominator. CONTRAST:  108mL OMNIPAQUE IOHEXOL 350 MG/ML SOLN COMPARISON:  Prior head CT from earlier the same day. FINDINGS: CT HEAD FINDINGS Brain: Generalized age-related cerebral atrophy with chronic small vessel ischemic disease. Few scatter remote lacunar infarcts noted about the bilateral basal ganglia. Small chronic right cerebellar infarct. No acute intracranial hemorrhage. No acute large vessel territory infarct. No mass lesion, midline shift or mass effect. Mild ventricular prominence related to global parenchymal volume loss without hydrocephalus. No extra-axial fluid collection. Vascular: No hyperdense vessel. Calcified  atherosclerosis present at skull base. Skull: Scalp soft tissues and calvarium within normal limits. Sinuses: Mild chronic mucoperiosteal thickening noted within the ethmoidal air cells. Paranasal sinuses are otherwise clear. No mastoid effusion. Orbits:  Globes and orbital soft tissues within normal limits. Review of the MIP images confirms the above findings CTA NECK FINDINGS Aortic arch: Visualized aortic arch of normal caliber with normal 3 vessel morphology. Moderate atherosclerotic change about the arch and origin of the great vessels without hemodynamically significant stenosis. Visualized subclavian arteries patent bilaterally. Right carotid system: Right common carotid artery patent from its origin to the bifurcation without stenosis. Mild scattered calcified plaque about the right bifurcation/proximal right ICA without hemodynamically significant stenosis. Right ICA patent distally to the skull base without stenosis, dissection or occlusion. Left carotid system: Scattered calcified plaque within the left CCA without hemodynamically significant stenosis. Concentric predominantly soft plaque about the left bifurcation with associated stenosis of up to 55% by NASCET criteria. Left ICA patent distally to the skull base without stenosis, dissection or occlusion. Vertebral arteries: Both vertebral arteries arise from the subclavian arteries. Right vertebral artery dominant. Focal plaque at the origin of the right vertebral artery with relatively mild stenosis. Scattered atheromatous irregularity within the vertebral arteries distally without additional stenosis, dissection, or occlusion. Skeleton: No acute osseous abnormality. No discrete or worrisome osseous lesions. Moderate cervical spondylosis present at C4-5 and C5-6. Other neck: No other acute soft tissue abnormality within the neck. No mass lesion or adenopathy. Upper chest: Visualized upper chest demonstrates no acute finding. Mild centrilobular emphysema. Left-sided pacemaker/AICD in place. Review of the MIP images confirms the above findings CTA HEAD FINDINGS Anterior circulation: Petrous segments widely patent bilaterally. Scattered calcified plaque throughout the cavernous/supraclinoid ICAs with  associated moderate multifocal stenosis. A1 segments patent bilaterally. Normal anterior communicating artery complex. Anterior cerebral arteries patent to their distal aspects without stenosis. Short-segment mild to moderate stenosis noted within the mid right M1 segment (series 12, image 22). M1 segments otherwise patent bilaterally. Normal MCA bifurcations. Distal MCA branches well perfused and symmetric. Diffuse small vessel atheromatous irregularity. Posterior circulation: Multifocal atheromatous irregularity throughout the dominant right V4 segment without high-grade stenosis. Right PICA patent. Multifocal severe near occlusive stenoses seen involving the diminutive left V4 segment, which nearly occludes by the vertebrobasilar junction. Left PICA patent. Moderate irregular stenosis seen involving the proximal basilar artery. Basilar diffusely irregular but otherwise patent to its distal aspect without high-grade stenosis. Superior cerebral arteries patent bilaterally. Predominant fetal type origin of both PCAs supplied via hypoplastic P1 segments and robust bilateral posterior communicating arteries. Right PCA patent to its distal aspect without high-grade stenosis. Short-segment moderate mid left P2 stenosis noted. Left PCA otherwise patent. Venous sinuses: Patent. Anatomic variants: Predominant fetal type origin of the PCAs. No intracranial aneurysm. Review of the MIP images confirms the above findings IMPRESSION: CT HEAD IMPRESSION: 1. Stable head CT.  No acute intracranial abnormality identified. 2. Age-related cerebral atrophy with chronic small vessel ischemic disease with scattered chronic lacunar infarcts as above. CTA HEAD AND NECK IMPRESSION: 1. Negative CTA for large vessel occlusion. 2. 50% atheromatous stenosis about the left carotid bifurcation. 3. Moderate multifocal atheromatous irregularity throughout the intracranial circulation as above. Notable findings include moderate multifocal stenoses  about the carotid siphons, mild to moderate mid right M1 stenosis, multifocal severe near occlusive these for stenoses, irregular moderate stenosis of the proximal basilar artery, with moderate left P2 stenosis. 4.  Emphysema (ICD10-J43.9). Electronically Signed   By: Pincus Badder.D.  On: 09/10/2019 01:20   MR BRAIN WO CONTRAST  Result Date: 09/10/2019 CLINICAL DATA:  Generalized weakness. Mental status changes. Negative CT evaluation. EXAM: MRI HEAD WITHOUT CONTRAST TECHNIQUE: Multiplanar, multiecho pulse sequences of the brain and surrounding structures were obtained without intravenous contrast. COMPARISON:  CT angiography 09/09/2019. FINDINGS: Brain: Diffusion imaging shows a small region of acute infarction affecting the right deep insular cortex in the underlying radiating white matter tracts. No evidence of hemorrhage or mass effect. No other acute infarction. Elsewhere, there are old small vessel ischemic changes of the pons. Old small vessel cerebellar infarction on the right. Moderate changes of chronic small vessel disease of the cerebral hemispheric white matter. No mass lesion, recent hemorrhage, hydrocephalus or extra-axial collection. Small focus of hemosiderin deposition in the right frontal white matter related to an old insult. Vascular: Flow is present within the major vessels at the base of the brain, with the exception of the left vertebral artery which shows abnormal flow, better demonstrated at CT angiography. Skull and upper cervical spine: Negative Sinuses/Orbits: Clear/normal Other: None IMPRESSION: Acute infarction in the right deep insula and radiating white matter tracts. No mass effect or hemorrhage. Atrophy and chronic small-vessel ischemic changes affecting the brain elsewhere as described. Old right frontal white matter hemorrhage. Abnormal flow in the distal left vertebral artery as shown by CT angiography, presumably unrelated to the acute insult. Electronically  Signed   By: Nelson Chimes M.D.   On: 09/10/2019 14:17   DG CHEST PORT 1 VIEW  Result Date: 09/10/2019 CLINICAL DATA:  Adjustment Management of cardiac pacemaker EXAM: PORTABLE CHEST 1 VIEW COMPARISON:  09/09/2019 FINDINGS: There is no focal consolidation. There is no pleural effusion or pneumothorax. The heart and mediastinal contours are unremarkable. There is thoracic aortic atherosclerosis. There is a dual lead cardiac pacemaker with the leads in satisfactory unchanged position on a single frontal AP view. There is no acute osseous abnormality. There is moderate osteoarthritis of the right glenohumeral joint. IMPRESSION: No active disease. Electronically Signed   By: Kathreen Devoid   On: 09/10/2019 11:05   DG Chest Portable 1 View  Result Date: 09/09/2019 CLINICAL DATA:  Weakness. EXAM: PORTABLE CHEST 1 VIEW COMPARISON:  May 06, 2019 FINDINGS: Stable cardiac pacemaker/defibrillator. Enlarged cardiac silhouette. Calcific atherosclerotic disease of the aorta. There is no evidence of focal airspace consolidation, pleural effusion or pneumothorax. Osseous structures are without acute abnormality. Soft tissues are grossly normal. IMPRESSION: Enlarged cardiac silhouette, otherwise no acute process. Electronically Signed   By: Fidela Salisbury M.D.   On: 09/09/2019 11:36   DG Swallowing Func-Speech Pathology  Result Date: 09/10/2019 Objective Swallowing Evaluation: Type of Study: MBS-Modified Barium Swallow Study  Patient Details Name: [name] MRN: [id number] Date of Birth: March 17, 1926 Today's Date: 09/10/2019 Time: SLP Start Time (ACUTE ONLY): 0919 -SLP Stop Time (ACUTE ONLY): 0940 SLP Time Calculation (min) (ACUTE ONLY): 21 min Past Medical History: Past Medical History: Diagnosis Date . Hypertension  . Hypothyroidism  . Presence of permanent cardiac pacemaker  Past Surgical History: No past surgical history on file. HPI: 86 yo male adm to [facility] with weakness, dysarthria and fall with concern for acute  CVA. Pt has h/o falling and hitting his head.  Pt CT scan head showed chronic lacunar CVAs x2 basal ganglia, small right cerebellar CVA.  Pt has h/o Bell's palsy 20 years prior, remote smoking/cigar use ceasing 40 years ago.  He failed a Audiological scientist and SLP was ordered. MRI pending/delayed due to pacemaker.  Subjective: pt awake in chair Assessment / Plan / Recommendation CHL IP CLINICAL IMPRESSIONS 09/10/2019 Clinical Impression Patient presentn with moderate oropharyngeal dysphagia with sensorimotor difficulties result in discoordination with premature spillage of boluses into pharynx/larynx before swallow triggered.  Pooling to pyriform sinus noted up to 6 seconds with thin liquids.  Chin tuck posture allowed SILENT aspiration of thin and nectar consistencies - and worsened airway protection.  Control of bolus strictly to small single cup sip prevented aspiration of nectar.  Pt did have very delayed cough after aspirates - large amount of aspiration but did not clear aspirates.  Head turn left did not prevent aspiration nor decrease residuals.  Pt with mild vallecular retention with liquids and puree- Cues to "hock" helpful to decrease x1.  There were also 2 occasions where pt thought he had swallowed and he had not - initiation difficulties noted. Recommend pt have dys3/ground meat/nectar diet and allow thin water between meals after mouth care for hydration/QOL.  Using teach back, educated pt to findings/recommendations with live monitor.  Will follow up for dysphagia management.    SLP Visit Diagnosis Dysphagia, oropharyngeal phase (R13.12) Attention and concentration deficit following -- Frontal lobe and executive function deficit following -- Impact on safety and function Moderate aspiration risk   CHL IP TREATMENT RECOMMENDATION 09/10/2019 Treatment Recommendations Defer until completion of intrumental exam   Prognosis 09/10/2019 Prognosis for Safe Diet Advancement Good Barriers to Reach Goals -- Barriers/Prognosis  Comment -- CHL IP DIET RECOMMENDATION 09/10/2019 SLP Diet Recommendations Dysphagia 3 (Mech soft) solids;Nectar thick liquid Liquid Administration via Spoon;Cup;No straw Medication Administration Whole meds with puree Compensations Slow rate;Small sips/bites;Effortful swallow;Other (Comment) Postural Changes Remain semi-upright after after feeds/meals (Comment);Seated upright at 90 degrees   CHL IP OTHER RECOMMENDATIONS 09/10/2019 Recommended Consults -- Oral Care Recommendations Oral care QID Other Recommendations Order thickener from pharmacy;Have oral suction available   No flowsheet data found.  No flowsheet data found.     CHL IP ORAL PHASE 09/10/2019 Oral Phase Impaired Oral - Pudding Teaspoon -- Oral - Pudding Cup -- Oral - Honey Teaspoon -- Oral - Honey Cup -- Oral - Nectar Teaspoon Delayed oral transit;Decreased bolus cohesion;Premature spillage Oral - Nectar Cup Delayed oral transit;Decreased bolus cohesion;Premature spillage Oral - Nectar Straw Delayed oral transit;Decreased bolus cohesion;Premature spillage Oral - Thin Teaspoon Delayed oral transit;Decreased bolus cohesion;Premature spillage Oral - Thin Cup Delayed oral transit;Decreased bolus cohesion;Premature spillage Oral - Thin Straw Delayed oral transit;Decreased bolus cohesion;Premature spillage Oral - Puree Delayed oral transit;Decreased bolus cohesion;Premature spillage Oral - Mech Soft Delayed oral transit Oral - Regular Decreased velopharyngeal closure;Nasal reflux;Delayed oral transit Oral - Multi-Consistency -- Oral - Pill -- Oral Phase - Comment --  CHL IP PHARYNGEAL PHASE 09/10/2019 Pharyngeal Phase Impaired Pharyngeal- Pudding Teaspoon -- Pharyngeal -- Pharyngeal- Pudding Cup -- Pharyngeal -- Pharyngeal- Honey Teaspoon -- Pharyngeal -- Pharyngeal- Honey Cup -- Pharyngeal -- Pharyngeal- Nectar Teaspoon Delayed swallow initiation-pyriform sinuses Pharyngeal Material does not enter airway Pharyngeal- Nectar Cup Delayed swallow  initiation-pyriform sinuses Pharyngeal Material does not enter airway Pharyngeal- Nectar Straw Delayed swallow initiation-pyriform sinuses;Penetration/Aspiration before swallow;Moderate aspiration Pharyngeal Material enters airway, passes BELOW cords without attempt by patient to eject out (silent aspiration) Pharyngeal- Thin Teaspoon Delayed swallow initiation-vallecula;Pharyngeal residue - valleculae Pharyngeal Material does not enter airway Pharyngeal- Thin Cup Delayed swallow initiation-pyriform sinuses;Reduced anterior laryngeal mobility;Reduced laryngeal elevation;Reduced airway/laryngeal closure;Penetration/Aspiration before swallow Pharyngeal Material enters airway, passes BELOW cords without attempt by patient to eject out (silent aspiration) Pharyngeal- Thin Straw Delayed swallow initiation-pyriform  sinuses Pharyngeal Material enters airway, passes BELOW cords without attempt by patient to eject out (silent aspiration) Pharyngeal- Puree Delayed swallow initiation-vallecula;Reduced pharyngeal peristalsis;Reduced epiglottic inversion;Reduced tongue base retraction;Pharyngeal residue - valleculae Pharyngeal -- Pharyngeal- Mechanical Soft Delayed swallow initiation-vallecula Pharyngeal -- Pharyngeal- Regular -- Pharyngeal -- Pharyngeal- Multi-consistency -- Pharyngeal -- Pharyngeal- Pill -- Pharyngeal -- Pharyngeal Comment nectar via cup with head turn left did not prevent aspiration, allowed trace aspiration posterior trach, chin tuck worsened airway protection with gross spillage over epiglottis, During MBS, pt stated "I swallowed" x2 when he had not.  Controlling amount of nectar strictly prevented aspiration but pt at higher risk due to silent nature of aspiration.  Larger liquid bolus faciliated efficient swallow.    CHL IP CERVICAL ESOPHAGEAL PHASE 09/10/2019 Cervical Esophageal Phase WFL  Upon esophageal sweep, pt appeared clear Pudding Teaspoon -- Pudding Cup -- Honey Teaspoon -- Honey Cup -- Nectar  Teaspoon -- Nectar Cup -- Nectar Straw -- Thin Teaspoon -- Thin Cup -- Thin Straw -- Puree -- Mechanical Soft -- Regular -- Multi-consistency -- Pill -- Cervical Esophageal Comment -- Kathleen Lime, MS [facility] SLP Acute Rehab Services Office [phone] [name] 09/10/2019, 10:44 AM               Assessment/Plan Acute CVA (cerebrovascular accident) ([place]) BP controlled On Statin On Coumadin Therapy has started  Atrial fibrillation, unspecified type ([place]) Rate Controlled on Toprol His INR in facility was 4. Coumadin on hold Today  HYPERTENSION, BENIGN Controlled on Toprol Hypothyroidism, unspecified type TSH Normal in 5/21 Hyperlipidemia, unspecified hyperlipidemia type Lipitor LDL 77 Slow transit constipation Start on Colace and Prune juice per his request Oropharyngeal dysphagia Modified Diet Working with Orthoptist staff Communication:   Labs/tests ordered: CBC,CMP and INR  Total time spent in this patient care encounter was 45 _  minutes; greater than 50% of the visit spent counseling patient and staff, reviewing records , Labs and coordinating care for problems addressed at this encounter.

## 2019-09-19 NOTE — Telephone Encounter (Signed)
[name] from [facility] was calling to follow-up as to whether the patient would need to see one of Dr. Olin Pia APPs for a hospital f/u. She contacted the office yesterday but did not get a response. Please call back

## 2019-09-21 ENCOUNTER — Encounter: Payer: Self-pay | Admitting: Internal Medicine

## 2019-09-21 ENCOUNTER — Non-Acute Institutional Stay (SKILLED_NURSING_FACILITY): Payer: Medicare Other | Admitting: Internal Medicine

## 2019-09-21 DIAGNOSIS — K1230 Oral mucositis (ulcerative), unspecified: Secondary | ICD-10-CM | POA: Diagnosis not present

## 2019-09-21 DIAGNOSIS — Z7901 Long term (current) use of anticoagulants: Secondary | ICD-10-CM | POA: Diagnosis not present

## 2019-09-21 NOTE — Progress Notes (Signed)
Location:    Time Room Number: 39 Place of Service:  SNF [phone]) Provider:  Veleta Miners MD   Gabriel Dad, MD  Patient Care Team: Gabriel Dad, MD as PCP - General (Internal Medicine) Gabriel Cantor, MD (Ophthalmology) Gabriel Balding, MD (Orthopedic Surgery) Gabriel Sprang, MD (Cardiology) Gabriel Skates, MD (General Surgery) Gabriel Post, MD [facility])  Extended Emergency Contact Information Primary Emergency Contact: [name],[name] Address: [place], [place] [address] [name] of [place] Phone: [phone] Mobile Phone: [phone] Relation: Spouse Secondary Emergency Contact: Gabriel, Mccoy Home Phone: [phone] Relation: Spouse  Code Status:  Full Code Goals of care: Advanced Directive information Advanced Directives 09/19/2019  Does Patient Have a Medical Advance Directive? Yes  Type of Paramedic of [place];Living will  Does patient want to make changes to medical advance directive? No - Patient declined  Copy of [place] in Chart? Yes - validated most recent copy scanned in chart (See row information)  Would patient like information on creating a medical advance directive? -     Chief Complaint  Patient presents with  . Acute Visit    Follow up of Coumadin     HPI:  Pt is a 86 y.o. male seen today for an acute visit for Follow up of his Coumadin dose and Sore throat  Was in the hospital for Acute Right MCA infarct with left Sided weakness from 5/9-5/14  Patient has past medical history of hypertension, hypothyroidism, chronic diastolic CHF, permanent A. fib on Coumadin, traumatic ICH in January 2021 and Colles fracture of right radius, PPM implant.,  History of PMR, hyperlipidemia  Coumadin was held when patient came to the facility as his INR was 4. Today his INR is 3 and we need to restart his Coumadin. Patient also  complaining of sore throat.  No fever does have mild cough complaining of dry mouth  Past Medical History:  Diagnosis Date  . AF (atrial fibrillation) [facility])    a. s/p RFCA/PVI @ [facility] clinic 2007.  . Arthritis    "joints" (06/21/2013)  . Atrial flutter ([place])    a. 06/2013 s/p TEE/DCCV  . Chronic systolic CHF (congestive heart failure) ([place])    a. 06/2013 Echo: EF 20%, diff HK, mild LVH.  Marland Kitchen History of cardiovascular stress test    Lexiscan Myoview 8/16:  EF 59%, attenuation artifact, no ischemia; Low Risk  . Hyperlipidemia   . Hypertension   . HYPERTENSION, BENIGN 08/21/2009   Qualifier: Diagnosis of  By: Caryl Comes, MD, Remus Blake  Medications(s)  enalapril   . Hypothyroidism   . Neoplasm of uncertain behavior    SKIN  . Polymyalgia rheumatica ([place]) 01/13/2007   Qualifier: History of  By: Danny Lawless CMA, [place]     . Presence of permanent cardiac pacemaker    Past Surgical History:  Procedure Laterality Date  . ATRIAL FIBRILLATION ABLATION  2007  . CARDIOVERSION N/A 06/22/2013   Procedure: CARDIOVERSION;  Surgeon: Sueanne Margarita, MD;  Location: [facility] ENDOSCOPY;  Service: Cardiovascular;  Laterality: N/A;  15:17 Lido 20mg ,IV , Propofol 30 mg, IV synched cardioversion @ 150 joules by Dr. Lajoyce Lauber from a flutter to sinis brady which is baseline for this pt, reports 45-50 reg HR verified by Dr. Ashok Norris  . CARDIOVERSION N/A 11/21/2014   Procedure: CARDIOVERSION;  Surgeon: Fay Records, MD;  Location: [place];  Service: Cardiovascular;  Laterality: N/A;  . CARDIOVERSION N/A 07/14/2015   Procedure: CARDIOVERSION;  Surgeon: Sueanne Margarita, MD;  Location: [place];  Service: Cardiovascular;  Laterality: N/A;  . CATARACT EXTRACTION, BILATERAL Bilateral Summer 2009  . CHOLECYSTECTOMY    . EP IMPLANTABLE DEVICE N/A 11/10/2015   Procedure: Pacemaker Implant;  Surgeon: Gabriel Sprang, MD;  Location: [place] CV LAB;  Service: Cardiovascular;  Laterality: N/A;  . HAND SURGERY Right     "broke bone; grafted area w/bone from somewhere else"  . HEMORRHOID SURGERY    . INGUINAL HERNIA REPAIR Bilateral   . TEE WITHOUT CARDIOVERSION N/A 06/22/2013   Procedure: TRANSESOPHAGEAL ECHOCARDIOGRAM (TEE);  Surgeon: Sueanne Margarita, MD;  Location: [facility] ENDOSCOPY;  Service: Cardiovascular;  Laterality: N/A;  . TEE WITHOUT CARDIOVERSION N/A 07/14/2015   Procedure: TRANSESOPHAGEAL ECHOCARDIOGRAM (TEE);  Surgeon: Sueanne Margarita, MD;  Location: [facility] ENDOSCOPY;  Service: Cardiovascular;  Laterality: N/A;  . TONSILLECTOMY      No Known Allergies  Allergies as of 09/21/2019   No Known Allergies     Medication List       Accurate as of Sep 21, 2019 11:51 AM. If you have any questions, ask your nurse or doctor.        atorvastatin 40 MG tablet Commonly known as: LIPITOR Take 40 mg by mouth daily.   docusate sodium 100 MG capsule Commonly known as: COLACE Take 200 mg by mouth daily.   glucosamine-chondroitin 500-400 MG tablet Take 1 tablet by mouth 3 (three) times daily.   levothyroxine 50 MCG tablet Commonly known as: SYNTHROID Take 1 tablet (50 mcg total) by mouth daily before breakfast.   metoprolol succinate 25 MG 24 hr tablet Commonly known as: TOPROL-XL Take 12.5 mg by mouth daily.   multivitamin with minerals Tabs tablet Take 1 tablet by mouth daily. Centrum   sennosides-docusate sodium 8.6-50 MG tablet Commonly known as: SENOKOT-S Take 2 tablets by mouth daily.   Tubersol 5 UNIT/0.1ML injection Generic drug: tuberculin Inject into the skin once. Once A Day Every 14 Days   warfarin 2 MG tablet Commonly known as: COUMADIN Take as directed by the anticoagulation clinic. If you are unsure how to take this medication, talk to your nurse or doctor. Original instructions: Take 4 mg by mouth. 2 tablets on Tues, Wed, Thurs, Sat, and Sun.   warfarin 3 MG tablet Commonly known as: COUMADIN Take as directed by the anticoagulation clinic. If you are unsure how to take this  medication, talk to your nurse or doctor. Original instructions: Take 3 mg by mouth daily. Once a day on Monday and Friday       Review of Systems  Constitutional: Positive for activity change and appetite change.  HENT: Positive for sore throat.   Cardiovascular: Negative.   Gastrointestinal: Negative.   Genitourinary: Negative.   Musculoskeletal: Positive for gait problem.  Skin: Negative.   Neurological: Positive for weakness.  Psychiatric/Behavioral: Negative.     Immunization History  Administered Date(s) Administered  . Influenza Split 01/25/2012  . Influenza Whole 02/08/2008, 03/12/2009, 01/16/2010  . Influenza, High Dose Seasonal PF 01/24/2015, 03/04/2017, 02/16/2018, 12/29/2018  . Influenza,inj,Quad PF,6+ Mos 01/25/2013, 01/23/2014  . Influenza-Unspecified 01/02/2016  . PFIZER SARS-COV-2 Vaccination 05/22/2019, 06/12/2019  . Pneumococcal Conjugate-13 01/25/2013  . Pneumococcal Polysaccharide-23 05/28/2016  . Td 01/16/2010  . Tdap 05/06/2019   Pertinent  Health Maintenance Due  Topic Date Due  . INFLUENZA VACCINE  12/02/2019  . PNA vac Low Risk Adult  Completed   Fall Risk  03/23/2019 03/17/2018 02/16/2018 05/28/2016 04/08/2016  Falls in the past year? 0 1 No No No  Comment [facility] Telephone Survey: data to providers prior to load [employer] Telephone Survey: data to providers prior to load - - [facility] Telephone Survey: data to providers prior to load  Number falls in past yr: - 1 - - -  Comment - [facility] Telephone Survey Actual Response = 2 - - -  Injury with Fall? - 1 - - -   Functional Status Survey:    Vitals:   09/21/19 1140  BP: (!) 157/80  Pulse: 61  Resp: 18  Temp: (!) 97.4 F (36.3 C)  SpO2: 97%  Weight: 142 lb 8 oz (64.6 kg)  Height: 5\' 9"  (1.753 m)   Physical Exam Vitals reviewed.  Constitutional:      Appearance: Normal appearance.  HENT:     Head: Normocephalic.     Nose: Nose normal.     Mouth/Throat:     Mouth: Mucous membranes are dry.      Pharynx: Posterior oropharyngeal erythema present.     Comments: No Thrush Throat red and inflamed Eyes:     Pupils: Pupils are equal, round, and reactive to light.  Cardiovascular:     Rate and Rhythm: Normal rate. Rhythm irregular.     Pulses: Normal pulses.  Pulmonary:     Effort: Pulmonary effort is normal. No respiratory distress.     Breath sounds: No wheezing or rales.  Abdominal:     General: Abdomen is flat. Bowel sounds are normal.     Palpations: Abdomen is soft.  Musculoskeletal:        General: No swelling.     Cervical back: Neck supple.  Skin:    General: Skin is warm.  Neurological:     Mental Status: He is alert and oriented to person, place, and time.     Comments: Left Facial Droop Left Hand 3-4/5 Left Leg 3/5 Right UE and LE was Normal Strength  Psychiatric:        Mood and Affect: Mood normal.        Thought Content: Thought content normal.     Labs reviewed: Recent Labs    09/09/19 1138 09/10/19 0621 09/12/19 0237 09/13/19 0454 09/14/19 0301  NA 141   < > 140 140 141  K 4.5   < > 4.2 3.9 4.2  CL 104   < > 103 106 107  CO2 28   < > 26 26 25   GLUCOSE 104*   < > 104* 109* 118*  BUN 16   < > 28* 33* 33*  CREATININE 0.98   < > 1.17 1.26* 1.14  CALCIUM 9.3   < > 8.9 8.8* 8.9  MG 2.0  --   --   --   --    < > = values in this interval not displayed.   Recent Labs    10/04/18 0834 05/07/19 0512 09/09/19 1138  AST 16 31 21   ALT 5 8 10   ALKPHOS 76 59 72  BILITOT 0.8 1.3* 1.0  PROT 6.9 6.0* 6.6  ALBUMIN 4.3 3.4* 3.8   Recent Labs    10/04/18 0834 10/04/18 0834 05/06/19 1320 05/07/19 0512 09/09/19 1138 09/10/19 0621 09/12/19 0237 09/13/19 0454 09/14/19 0301  WBC 5.1   < > 7.8   < > 4.2   < > 6.2 5.6 5.4  NEUTROABS 3.4  --  6.5  --  3.0  --   --   --   --  HGB 13.6   < > 13.6   < > 13.4   < > 13.1 12.6* 12.9*  HCT 40.1   < > 42.6   < > 42.2   < > 40.5 38.4* 39.3  MCV 91.9   < > 96.4   < > 95.3   < > 93.3 93.9 94.0  PLT 157.0   <  > 155   < > 134*   < > 124* 128* 133*   < > = values in this interval not displayed.   Lab Results  Component Value Date   TSH 3.939 09/12/2019   Lab Results  Component Value Date   HGBA1C 5.5 09/10/2019   Lab Results  Component Value Date   CHOL 133 09/10/2019   HDL 41 09/10/2019   LDLCALC 77 09/10/2019   TRIG 73 09/10/2019   CHOLHDL 3.2 09/10/2019    Significant Diagnostic Results in last 30 days:  CT Angio Head W or Wo Contrast  Result Date: 09/10/2019 CLINICAL DATA:  Follow-up examination for acute stroke. EXAM: CT ANGIOGRAPHY HEAD AND NECK TECHNIQUE: Multidetector CT imaging of the head and neck was performed using the standard protocol during bolus administration of intravenous contrast. Multiplanar CT image reconstructions and MIPs were obtained to evaluate the vascular anatomy. Carotid stenosis measurements (when applicable) are obtained utilizing NASCET criteria, using the distal internal carotid diameter as the denominator. CONTRAST:  21mL OMNIPAQUE IOHEXOL 350 MG/ML SOLN COMPARISON:  Prior head CT from earlier the same day. FINDINGS: CT HEAD FINDINGS Brain: Generalized age-related cerebral atrophy with chronic small vessel ischemic disease. Few scatter remote lacunar infarcts noted about the bilateral basal ganglia. Small chronic right cerebellar infarct. No acute intracranial hemorrhage. No acute large vessel territory infarct. No mass lesion, midline shift or mass effect. Mild ventricular prominence related to global parenchymal volume loss without hydrocephalus. No extra-axial fluid collection. Vascular: No hyperdense vessel. Calcified atherosclerosis present at skull base. Skull: Scalp soft tissues and calvarium within normal limits. Sinuses: Mild chronic mucoperiosteal thickening noted within the ethmoidal air cells. Paranasal sinuses are otherwise clear. No mastoid effusion. Orbits: Globes and orbital soft tissues within normal limits. Review of the MIP images confirms the  above findings CTA NECK FINDINGS Aortic arch: Visualized aortic arch of normal caliber with normal 3 vessel morphology. Moderate atherosclerotic change about the arch and origin of the great vessels without hemodynamically significant stenosis. Visualized subclavian arteries patent bilaterally. Right carotid system: Right common carotid artery patent from its origin to the bifurcation without stenosis. Mild scattered calcified plaque about the right bifurcation/proximal right ICA without hemodynamically significant stenosis. Right ICA patent distally to the skull base without stenosis, dissection or occlusion. Left carotid system: Scattered calcified plaque within the left CCA without hemodynamically significant stenosis. Concentric predominantly soft plaque about the left bifurcation with associated stenosis of up to 55% by NASCET criteria. Left ICA patent distally to the skull base without stenosis, dissection or occlusion. Vertebral arteries: Both vertebral arteries arise from the subclavian arteries. Right vertebral artery dominant. Focal plaque at the origin of the right vertebral artery with relatively mild stenosis. Scattered atheromatous irregularity within the vertebral arteries distally without additional stenosis, dissection, or occlusion. Skeleton: No acute osseous abnormality. No discrete or worrisome osseous lesions. Moderate cervical spondylosis present at C4-5 and C5-6. Other neck: No other acute soft tissue abnormality within the neck. No mass lesion or adenopathy. Upper chest: Visualized upper chest demonstrates no acute finding. Mild centrilobular emphysema. Left-sided pacemaker/AICD in place. Review of the MIP  images confirms the above findings CTA HEAD FINDINGS Anterior circulation: Petrous segments widely patent bilaterally. Scattered calcified plaque throughout the cavernous/supraclinoid ICAs with associated moderate multifocal stenosis. A1 segments patent bilaterally. Normal anterior  communicating artery complex. Anterior cerebral arteries patent to their distal aspects without stenosis. Short-segment mild to moderate stenosis noted within the mid right M1 segment (series 12, image 22). M1 segments otherwise patent bilaterally. Normal MCA bifurcations. Distal MCA branches well perfused and symmetric. Diffuse small vessel atheromatous irregularity. Posterior circulation: Multifocal atheromatous irregularity throughout the dominant right V4 segment without high-grade stenosis. Right PICA patent. Multifocal severe near occlusive stenoses seen involving the diminutive left V4 segment, which nearly occludes by the vertebrobasilar junction. Left PICA patent. Moderate irregular stenosis seen involving the proximal basilar artery. Basilar diffusely irregular but otherwise patent to its distal aspect without high-grade stenosis. Superior cerebral arteries patent bilaterally. Predominant fetal type origin of both PCAs supplied via hypoplastic P1 segments and robust bilateral posterior communicating arteries. Right PCA patent to its distal aspect without high-grade stenosis. Short-segment moderate mid left P2 stenosis noted. Left PCA otherwise patent. Venous sinuses: Patent. Anatomic variants: Predominant fetal type origin of the PCAs. No intracranial aneurysm. Review of the MIP images confirms the above findings IMPRESSION: CT HEAD IMPRESSION: 1. Stable head CT.  No acute intracranial abnormality identified. 2. Age-related cerebral atrophy with chronic small vessel ischemic disease with scattered chronic lacunar infarcts as above. CTA HEAD AND NECK IMPRESSION: 1. Negative CTA for large vessel occlusion. 2. 50% atheromatous stenosis about the left carotid bifurcation. 3. Moderate multifocal atheromatous irregularity throughout the intracranial circulation as above. Notable findings include moderate multifocal stenoses about the carotid siphons, mild to moderate mid right M1 stenosis, multifocal severe near  occlusive these for stenoses, irregular moderate stenosis of the proximal basilar artery, with moderate left P2 stenosis. 4.  Emphysema (ICD10-J43.9). Electronically Signed   By: Jeannine Boga M.D.   On: 09/10/2019 01:20   CT HEAD WO CONTRAST  Result Date: 09/12/2019 CLINICAL DATA:  Follow-up stroke. EXAM: CT HEAD WITHOUT CONTRAST TECHNIQUE: Contiguous axial images were obtained from the base of the skull through the vertex without intravenous contrast. COMPARISON:  Sep 10, 2019 and Sep 09, 2019. FINDINGS: Brain: Increased low density is noted in the right insular subcortical white matter consistent with acute infarction as described on prior MRI. No mass effect or midline shift is noted. Ventricular size is within normal limits. No hemorrhage is noted. No definite mass lesion is noted. Vascular: No hyperdense vessel or unexpected calcification. Skull: Normal. Negative for fracture or focal lesion. Sinuses/Orbits: No acute finding. Other: None. IMPRESSION: Increased low density is noted in the right insular subcortical white matter consistent with acute infarction as described on prior MRI. No mass effect or midline shift is noted. No hemorrhage is noted. Electronically Signed   By: Marijo Conception M.D.   On: 09/12/2019 11:43   CT Head Wo Contrast  Result Date: 09/09/2019 CLINICAL DATA:  Encephalopathy. Additional history provided: Generalized weakness, poor appetite, increased urinary frequency, bladder incontinence. Patient's wife reports slurred speech since Friday and confusion. Patient reports weakness. EXAM: CT HEAD WITHOUT CONTRAST TECHNIQUE: Contiguous axial images were obtained from the base of the skull through the vertex without intravenous contrast. COMPARISON:  Noncontrast head CT 05/07/2019 FINDINGS: Brain: Ill-defined hypoattenuation within the cerebral white matter is nonspecific, but consistent with chronic small vessel ischemic disease. Redemonstrated chronic lacunar infarcts within  the left caudate head and right cerebellum. Moderate generalized parenchymal atrophy. There is  no acute intracranial hemorrhage. No demarcated cortical infarct. No extra-axial fluid collection. No evidence of intracranial mass. No midline shift. Vascular: No hyperdense vessel.  Atherosclerotic calcifications. Skull: Normal. Negative for fracture or focal lesion. Sinuses/Orbits: Visualized orbits show no acute finding. Minimal ethmoid and maxillary sinus mucosal thickening. No significant mastoid effusion. IMPRESSION: 1. No evidence of acute intracranial abnormality. 2. Stable generalized parenchymal atrophy and chronic small vessel ischemic disease. Redemonstrated chronic lacunar infarcts within the left caudate head and right cerebellum. 3. Minimal ethmoid and maxillary sinus mucosal thickening. Electronically Signed   By: Kellie Simmering DO   On: 09/09/2019 11:33   CT Angio Neck W and/or Wo Contrast  Result Date: 09/10/2019 CLINICAL DATA:  Follow-up examination for acute stroke. EXAM: CT ANGIOGRAPHY HEAD AND NECK TECHNIQUE: Multidetector CT imaging of the head and neck was performed using the standard protocol during bolus administration of intravenous contrast. Multiplanar CT image reconstructions and MIPs were obtained to evaluate the vascular anatomy. Carotid stenosis measurements (when applicable) are obtained utilizing NASCET criteria, using the distal internal carotid diameter as the denominator. CONTRAST:  10mL OMNIPAQUE IOHEXOL 350 MG/ML SOLN COMPARISON:  Prior head CT from earlier the same day. FINDINGS: CT HEAD FINDINGS Brain: Generalized age-related cerebral atrophy with chronic small vessel ischemic disease. Few scatter remote lacunar infarcts noted about the bilateral basal ganglia. Small chronic right cerebellar infarct. No acute intracranial hemorrhage. No acute large vessel territory infarct. No mass lesion, midline shift or mass effect. Mild ventricular prominence related to global parenchymal  volume loss without hydrocephalus. No extra-axial fluid collection. Vascular: No hyperdense vessel. Calcified atherosclerosis present at skull base. Skull: Scalp soft tissues and calvarium within normal limits. Sinuses: Mild chronic mucoperiosteal thickening noted within the ethmoidal air cells. Paranasal sinuses are otherwise clear. No mastoid effusion. Orbits: Globes and orbital soft tissues within normal limits. Review of the MIP images confirms the above findings CTA NECK FINDINGS Aortic arch: Visualized aortic arch of normal caliber with normal 3 vessel morphology. Moderate atherosclerotic change about the arch and origin of the great vessels without hemodynamically significant stenosis. Visualized subclavian arteries patent bilaterally. Right carotid system: Right common carotid artery patent from its origin to the bifurcation without stenosis. Mild scattered calcified plaque about the right bifurcation/proximal right ICA without hemodynamically significant stenosis. Right ICA patent distally to the skull base without stenosis, dissection or occlusion. Left carotid system: Scattered calcified plaque within the left CCA without hemodynamically significant stenosis. Concentric predominantly soft plaque about the left bifurcation with associated stenosis of up to 55% by NASCET criteria. Left ICA patent distally to the skull base without stenosis, dissection or occlusion. Vertebral arteries: Both vertebral arteries arise from the subclavian arteries. Right vertebral artery dominant. Focal plaque at the origin of the right vertebral artery with relatively mild stenosis. Scattered atheromatous irregularity within the vertebral arteries distally without additional stenosis, dissection, or occlusion. Skeleton: No acute osseous abnormality. No discrete or worrisome osseous lesions. Moderate cervical spondylosis present at C4-5 and C5-6. Other neck: No other acute soft tissue abnormality within the neck. No mass lesion  or adenopathy. Upper chest: Visualized upper chest demonstrates no acute finding. Mild centrilobular emphysema. Left-sided pacemaker/AICD in place. Review of the MIP images confirms the above findings CTA HEAD FINDINGS Anterior circulation: Petrous segments widely patent bilaterally. Scattered calcified plaque throughout the cavernous/supraclinoid ICAs with associated moderate multifocal stenosis. A1 segments patent bilaterally. Normal anterior communicating artery complex. Anterior cerebral arteries patent to their distal aspects without stenosis. Short-segment mild to moderate stenosis noted  within the mid right M1 segment (series 12, image 22). M1 segments otherwise patent bilaterally. Normal MCA bifurcations. Distal MCA branches well perfused and symmetric. Diffuse small vessel atheromatous irregularity. Posterior circulation: Multifocal atheromatous irregularity throughout the dominant right V4 segment without high-grade stenosis. Right PICA patent. Multifocal severe near occlusive stenoses seen involving the diminutive left V4 segment, which nearly occludes by the vertebrobasilar junction. Left PICA patent. Moderate irregular stenosis seen involving the proximal basilar artery. Basilar diffusely irregular but otherwise patent to its distal aspect without high-grade stenosis. Superior cerebral arteries patent bilaterally. Predominant fetal type origin of both PCAs supplied via hypoplastic P1 segments and robust bilateral posterior communicating arteries. Right PCA patent to its distal aspect without high-grade stenosis. Short-segment moderate mid left P2 stenosis noted. Left PCA otherwise patent. Venous sinuses: Patent. Anatomic variants: Predominant fetal type origin of the PCAs. No intracranial aneurysm. Review of the MIP images confirms the above findings IMPRESSION: CT HEAD IMPRESSION: 1. Stable head CT.  No acute intracranial abnormality identified. 2. Age-related cerebral atrophy with chronic small vessel  ischemic disease with scattered chronic lacunar infarcts as above. CTA HEAD AND NECK IMPRESSION: 1. Negative CTA for large vessel occlusion. 2. 50% atheromatous stenosis about the left carotid bifurcation. 3. Moderate multifocal atheromatous irregularity throughout the intracranial circulation as above. Notable findings include moderate multifocal stenoses about the carotid siphons, mild to moderate mid right M1 stenosis, multifocal severe near occlusive these for stenoses, irregular moderate stenosis of the proximal basilar artery, with moderate left P2 stenosis. 4.  Emphysema (ICD10-J43.9). Electronically Signed   By: Jeannine Boga M.D.   On: 09/10/2019 01:20   MR BRAIN WO CONTRAST  Result Date: 09/10/2019 CLINICAL DATA:  Generalized weakness. Mental status changes. Negative CT evaluation. EXAM: MRI HEAD WITHOUT CONTRAST TECHNIQUE: Multiplanar, multiecho pulse sequences of the brain and surrounding structures were obtained without intravenous contrast. COMPARISON:  CT angiography 09/09/2019. FINDINGS: Brain: Diffusion imaging shows a small region of acute infarction affecting the right deep insular cortex in the underlying radiating white matter tracts. No evidence of hemorrhage or mass effect. No other acute infarction. Elsewhere, there are old small vessel ischemic changes of the pons. Old small vessel cerebellar infarction on the right. Moderate changes of chronic small vessel disease of the cerebral hemispheric white matter. No mass lesion, recent hemorrhage, hydrocephalus or extra-axial collection. Small focus of hemosiderin deposition in the right frontal white matter related to an old insult. Vascular: Flow is present within the major vessels at the base of the brain, with the exception of the left vertebral artery which shows abnormal flow, better demonstrated at CT angiography. Skull and upper cervical spine: Negative Sinuses/Orbits: Clear/normal Other: None IMPRESSION: Acute infarction in the  right deep insula and radiating white matter tracts. No mass effect or hemorrhage. Atrophy and chronic small-vessel ischemic changes affecting the brain elsewhere as described. Old right frontal white matter hemorrhage. Abnormal flow in the distal left vertebral artery as shown by CT angiography, presumably unrelated to the acute insult. Electronically Signed   By: Nelson Chimes M.D.   On: 09/10/2019 14:17   DG CHEST PORT 1 VIEW  Result Date: 09/10/2019 CLINICAL DATA:  Adjustment Management of cardiac pacemaker EXAM: PORTABLE CHEST 1 VIEW COMPARISON:  09/09/2019 FINDINGS: There is no focal consolidation. There is no pleural effusion or pneumothorax. The heart and mediastinal contours are unremarkable. There is thoracic aortic atherosclerosis. There is a dual lead cardiac pacemaker with the leads in satisfactory unchanged position on a single frontal AP view.  There is no acute osseous abnormality. There is moderate osteoarthritis of the right glenohumeral joint. IMPRESSION: No active disease. Electronically Signed   By: Kathreen Devoid   On: 09/10/2019 11:05   DG Chest Portable 1 View  Result Date: 09/09/2019 CLINICAL DATA:  Weakness. EXAM: PORTABLE CHEST 1 VIEW COMPARISON:  May 06, 2019 FINDINGS: Stable cardiac pacemaker/defibrillator. Enlarged cardiac silhouette. Calcific atherosclerotic disease of the aorta. There is no evidence of focal airspace consolidation, pleural effusion or pneumothorax. Osseous structures are without acute abnormality. Soft tissues are grossly normal. IMPRESSION: Enlarged cardiac silhouette, otherwise no acute process. Electronically Signed   By: Fidela Salisbury M.D.   On: 09/09/2019 11:36   DG Swallowing Func-Speech Pathology  Result Date: 09/12/2019 Objective Swallowing Evaluation: Type of Study: MBS-Modified Barium Swallow Study  Patient Details Name: VATSAL ORAVEC MRN: [id number] Date of Birth: Jul 03, 1925 Today's Date: 09/12/2019 Time: SLP Start Time (ACUTE ONLY): 1155 -SLP  Stop Time (ACUTE ONLY): 1215 SLP Time Calculation (min) (ACUTE ONLY): 20 min Past Medical History: Past Medical History: Diagnosis Date . Hypertension  . Hypothyroidism  . Presence of permanent cardiac pacemaker  Past Surgical History: No past surgical history on file. HPI: 86 yo male adm to [facility] with weakness, dysarthria and fall with concern for acute CVA.  Pt CT scan head showed chronic lacunar CVAs x2 basal ganglia, small right cerebellar CVA.  MRI showed insular CVA. Concern for worsening symptoms, so new CT has been ordered to r/o extension.  Subjective: Pt upright in MBS chair, agreeable to study. Assessment / Plan / Recommendation CHL IP CLINICAL IMPRESSIONS 09/12/2019 Clinical Impression Mr. Merrett was seen for a repeat MBS with concern for worsening swallow function compared to two days prior (see MBS note 5/10). Pt does demonstrate a mildly worsened dysphagia (moderate-severe pharyngeal dysphagia) with increased pharyngeal weakness. Oral phase remains mildly impaired and c/b reduced lingual control for bolus cohesion, resulting in trace buccal residue on L. He does not appear sensate to this residue. Pharyngeally, pt also demonstrates reduced sensation with absent swallow trigger noted intermittently with bolus resting in valleculae/pyriform sinus, requiring a cue to swallow. Pt demonstrates further reduced hyoid excursion, laryngeal elevation and pharyngeal constriction, which results in increased residue in pharynx around pyriform sinus, which was noted to spill into airway with subsequent swallows. Pt had trace aspiration noted with thin liquids via spoon/cup, nectar thick liquids via cup/straw, honey thick liquids via cup. Most instances of aspiration were from residue (versus from the initial swallow). He was only noted to sense aspiration x1 with cough response. Pt naturally lowers chin, which greatly worsens the aspiration. With pt being told to take small sips, he begins well, with use of small  sips, but then takes larger sips, resulting in aspiration. Given use of spoon, pt still had some penetration from residue after the swallow, but no aspiration was visualized. He had no penetration/aspiration of purees or ground solids, but does continue with trace-mild residue in valleculae and pyriform sinus. Overall, pt is at high risk for aspiration/aspiration PNA, however, given use of spoon with nectar thick liquids and ground solids, pt was seen with improved performance. D/t high risk of dehydration on this nectar thick (spoon) consistency, recommend he continue with free water protocol after oral care. Water may be consumed by cup. Pt thoroughly educated to continue prior precautions of coughing intermittently and keeping head in neutral position. He demonstrated understanding and agreement. SLP Visit Diagnosis Dysphagia, oropharyngeal phase (R13.12) Impact on safety and function Moderate aspiration  risk;Severe aspiration risk   CHL IP TREATMENT RECOMMENDATION 09/10/2019 Treatment Recommendations Defer until completion of intrumental exam   Prognosis 09/12/2019 Prognosis for Safe Diet Advancement Fair Barriers to Reach Goals Severity of deficits CHL IP DIET RECOMMENDATION 09/12/2019 SLP Diet Recommendations Nectar thick liquid;Dysphagia 2 (Fine chop) solids Liquid Administration via Spoon Medication Administration Whole meds with puree Compensations Small sips/bites;Hard cough after swallow Postural Changes Neutral/normal head position   CHL IP OTHER RECOMMENDATIONS 09/12/2019 Recommended Consults n/a Oral Care Recommendations Oral care BID Other Recommendations    CHL IP FOLLOW UP RECOMMENDATIONS 09/12/2019 Follow up Recommendations Skilled Nursing facility   [facility] IP FREQUENCY AND DURATION 09/12/2019 Speech Therapy Frequency (ACUTE ONLY) min 2x/week Treatment Duration 1 week      CHL IP ORAL PHASE 09/12/2019 Oral Phase Impaired Oral - Thin Cup Piecemeal swallowing Oral - Thin Straw NT Oral - Puree Piecemeal  swallowing Oral - Mech Soft Left pocketing in lateral sulci  CHL IP PHARYNGEAL PHASE 09/12/2019 Pharyngeal Phase Impaired Pharyngeal- Honey Cup Delayed swallow initiation-pyriform sinuses;Reduced pharyngeal peristalsis;Reduced epiglottic inversion;Reduced anterior laryngeal mobility;Reduced laryngeal elevation;Reduced airway/laryngeal closure;Reduced tongue base retraction;Penetration/Apiration after swallow;Trace aspiration;Pharyngeal residue - cp segment  Pharyngeal Material enters airway, passes BELOW cords without attempt by patient to eject out (silent aspiration) Pharyngeal- Nectar Teaspoon Delayed swallow initiation-pyriform sinuses;Reduced pharyngeal peristalsis;Reduced epiglottic inversion;Reduced anterior laryngeal mobility;Reduced laryngeal elevation;Reduced airway/laryngeal closure;Reduced tongue base retraction;Penetration/Apiration after swallow;Trace aspiration;Pharyngeal residue - cp segment Pharyngeal Material does not enter airway Pharyngeal- Nectar Cup Delayed swallow initiation-pyriform sinuses;Reduced pharyngeal peristalsis;Reduced epiglottic inversion;Reduced anterior laryngeal mobility;Reduced laryngeal elevation;Reduced airway/laryngeal closure;Reduced tongue base retraction;Penetration/Apiration after swallow;Trace aspiration;Pharyngeal residue - cp segment Pharyngeal Material enters airway, passes BELOW cords without attempt by patient to eject out (silent aspiration) Pharyngeal- Nectar Straw Delayed swallow initiation-pyriform sinuses;Reduced pharyngeal peristalsis;Reduced epiglottic inversion;Reduced anterior laryngeal mobility;Reduced laryngeal elevation;Reduced airway/laryngeal closure;Reduced tongue base retraction;Penetration/Apiration after swallow;Trace aspiration;Pharyngeal residue - cp segment Pharyngeal Material enters airway, passes BELOW cords without attempt by patient to eject out (silent aspiration) Pharyngeal- Thin Teaspoon Penetration/Aspiration before swallow Pharyngeal  Material enters airway, CONTACTS cords and not ejected out; Material enters airway, passes BELOW cords without attempt by patient to eject out (silent aspiration) Pharyngeal- Thin Cup Pharyngeal residue - cp segment;Pharyngeal residue - pyriform;Trace aspiration;Penetration/Aspiration before swallow;Penetration/Apiration after swallow;Reduced pharyngeal peristalsis;Reduced epiglottic inversion;Reduced anterior laryngeal mobility;Reduced laryngeal elevation;Delayed swallow initiation-pyriform sinuses Pharyngeal Material enters airway, passes BELOW cords without attempt by patient to eject out (silent aspiration) Pharyngeal- Thin Straw NT Pharyngeal Material enters airway, passes BELOW cords without attempt by patient to eject out (silent aspiration) Pharyngeal- Puree Delayed swallow initiation-vallecula;Reduced pharyngeal peristalsis;Reduced tongue base retraction;Pharyngeal residue - valleculae;Pharyngeal residue - pyriform Pharyngeal Material does not enter airway Pharyngeal- Mechanical Soft Reduced epiglottic inversion;Reduced pharyngeal peristalsis;Delayed swallow initiation-vallecula;Pharyngeal residue - pyriform;Pharyngeal residue - valleculae  CHL IP CERVICAL ESOPHAGEAL PHASE 09/10/2019 Cervical Esophageal Phase [facility] P. Isenhour, M.S., CCC-SLP Speech-Language Pathologist Acute Rehabilitation Services Pager: [place] 09/12/2019, 1:48 PM              DG Swallowing Func-Speech Pathology  Result Date: 09/10/2019 Objective Swallowing Evaluation: Type of Study: MBS-Modified Barium Swallow Study  Patient Details Name: PROSPERO PIERSALL MRN: [id number] Date of Birth: June 21, 1925 Today's Date: 09/10/2019 Time: SLP Start Time (ACUTE ONLY): 0919 -SLP Stop Time (ACUTE ONLY): 0940 SLP Time Calculation (min) (ACUTE ONLY): 21 min Past Medical History: Past Medical History: Diagnosis Date . Hypertension  . Hypothyroidism  . Presence of permanent cardiac pacemaker  Past Surgical History: No past  surgical history on file.  HPI: 86 yo male adm to [facility] with weakness, dysarthria and fall with concern for acute CVA. Pt has h/o falling and hitting his head.  Pt CT scan head showed chronic lacunar CVAs x2 basal ganglia, small right cerebellar CVA.  Pt has h/o Bell's palsy 20 years prior, remote smoking/cigar use ceasing 40 years ago.  He failed a Audiological scientist and SLP was ordered. MRI pending/delayed due to pacemaker.  Subjective: pt awake in chair Assessment / Plan / Recommendation CHL IP CLINICAL IMPRESSIONS 09/10/2019 Clinical Impression Patient presentn with moderate oropharyngeal dysphagia with sensorimotor difficulties result in discoordination with premature spillage of boluses into pharynx/larynx before swallow triggered.  Pooling to pyriform sinus noted up to 6 seconds with thin liquids.  Chin tuck posture allowed SILENT aspiration of thin and nectar consistencies - and worsened airway protection.  Control of bolus strictly to small single cup sip prevented aspiration of nectar.  Pt did have very delayed cough after aspirates - large amount of aspiration but did not clear aspirates.  Head turn left did not prevent aspiration nor decrease residuals.  Pt with mild vallecular retention with liquids and puree- Cues to "hock" helpful to decrease x1.  There were also 2 occasions where pt thought he had swallowed and he had not - initiation difficulties noted. Recommend pt have dys3/ground meat/nectar diet and allow thin water between meals after mouth care for hydration/QOL.  Using teach back, educated pt to findings/recommendations with live monitor.  Will follow up for dysphagia management.    SLP Visit Diagnosis Dysphagia, oropharyngeal phase (R13.12) Attention and concentration deficit following -- Frontal lobe and executive function deficit following -- Impact on safety and function Moderate aspiration risk   CHL IP TREATMENT RECOMMENDATION 09/10/2019 Treatment Recommendations Defer until completion of intrumental exam    Prognosis 09/10/2019 Prognosis for Safe Diet Advancement Good Barriers to Reach Goals -- Barriers/Prognosis Comment -- CHL IP DIET RECOMMENDATION 09/10/2019 SLP Diet Recommendations Dysphagia 3 (Mech soft) solids;Nectar thick liquid Liquid Administration via Spoon;Cup;No straw Medication Administration Whole meds with puree Compensations Slow rate;Small sips/bites;Effortful swallow;Other (Comment) Postural Changes Remain semi-upright after after feeds/meals (Comment);Seated upright at 90 degrees   CHL IP OTHER RECOMMENDATIONS 09/10/2019 Recommended Consults -- Oral Care Recommendations Oral care QID Other Recommendations Order thickener from pharmacy;Have oral suction available   No flowsheet data found.  No flowsheet data found.     CHL IP ORAL PHASE 09/10/2019 Oral Phase Impaired Oral - Pudding Teaspoon -- Oral - Pudding Cup -- Oral - Honey Teaspoon -- Oral - Honey Cup -- Oral - Nectar Teaspoon Delayed oral transit;Decreased bolus cohesion;Premature spillage Oral - Nectar Cup Delayed oral transit;Decreased bolus cohesion;Premature spillage Oral - Nectar Straw Delayed oral transit;Decreased bolus cohesion;Premature spillage Oral - Thin Teaspoon Delayed oral transit;Decreased bolus cohesion;Premature spillage Oral - Thin Cup Delayed oral transit;Decreased bolus cohesion;Premature spillage Oral - Thin Straw Delayed oral transit;Decreased bolus cohesion;Premature spillage Oral - Puree Delayed oral transit;Decreased bolus cohesion;Premature spillage Oral - Mech Soft Delayed oral transit Oral - Regular Decreased velopharyngeal closure;Nasal reflux;Delayed oral transit Oral - Multi-Consistency -- Oral - Pill -- Oral Phase - Comment --  CHL IP PHARYNGEAL PHASE 09/10/2019 Pharyngeal Phase Impaired Pharyngeal- Pudding Teaspoon -- Pharyngeal -- Pharyngeal- Pudding Cup -- Pharyngeal -- Pharyngeal- Honey Teaspoon -- Pharyngeal -- Pharyngeal- Honey Cup -- Pharyngeal -- Pharyngeal- Nectar Teaspoon Delayed swallow  initiation-pyriform sinuses Pharyngeal Material does not enter airway Pharyngeal- Nectar Cup Delayed swallow initiation-pyriform sinuses Pharyngeal Material does not enter airway Pharyngeal- Nectar Straw Delayed swallow initiation-pyriform sinuses;Penetration/Aspiration  before swallow;Moderate aspiration Pharyngeal Material enters airway, passes BELOW cords without attempt by patient to eject out (silent aspiration) Pharyngeal- Thin Teaspoon Delayed swallow initiation-vallecula;Pharyngeal residue - valleculae Pharyngeal Material does not enter airway Pharyngeal- Thin Cup Delayed swallow initiation-pyriform sinuses;Reduced anterior laryngeal mobility;Reduced laryngeal elevation;Reduced airway/laryngeal closure;Penetration/Aspiration before swallow Pharyngeal Material enters airway, passes BELOW cords without attempt by patient to eject out (silent aspiration) Pharyngeal- Thin Straw Delayed swallow initiation-pyriform sinuses Pharyngeal Material enters airway, passes BELOW cords without attempt by patient to eject out (silent aspiration) Pharyngeal- Puree Delayed swallow initiation-vallecula;Reduced pharyngeal peristalsis;Reduced epiglottic inversion;Reduced tongue base retraction;Pharyngeal residue - valleculae Pharyngeal -- Pharyngeal- Mechanical Soft Delayed swallow initiation-vallecula Pharyngeal -- Pharyngeal- Regular -- Pharyngeal -- Pharyngeal- Multi-consistency -- Pharyngeal -- Pharyngeal- Pill -- Pharyngeal -- Pharyngeal Comment nectar via cup with head turn left did not prevent aspiration, allowed trace aspiration posterior trach, chin tuck worsened airway protection with gross spillage over epiglottis, During MBS, pt stated "I swallowed" x2 when he had not.  Controlling amount of nectar strictly prevented aspiration but pt at higher risk due to silent nature of aspiration.  Larger liquid bolus faciliated efficient swallow.    CHL IP CERVICAL ESOPHAGEAL PHASE 09/10/2019 Cervical Esophageal Phase WFL  Upon  esophageal sweep, pt appeared clear Pudding Teaspoon -- Pudding Cup -- Honey Teaspoon -- Honey Cup -- Nectar Teaspoon -- Nectar Cup -- Nectar Straw -- Thin Teaspoon -- Thin Cup -- Thin Straw -- Puree -- Mechanical Soft -- Regular -- Multi-consistency -- Pill -- Cervical Esophageal Comment -- Kathleen Lime, MS [facility] SLP Acute Rehab Services Office [phone] [name] 09/10/2019, 10:44 AM              EEG adult  Result Date: 09/11/2019 Lora Havens, MD     09/11/2019 10:23 AM Patient Name: [name] MRN: [id number] Epilepsy Attending: Lora Havens Referring Physician/Provider: Dr. Mitzi Hansen Date: 09/10/2019 Duration: 27.08 mins Patient history: 86 year old male who presented with sudden onset of slurred speech left-sided weakness and difficulty walking.  EEG to evaluate for seizures. Level of alertness: Awake, asleep AEDs during EEG study: None Technical aspects: This EEG study was done with scalp electrodes positioned according to the 10-20 International system of electrode placement. Electrical activity was acquired at a sampling rate of 500Hz  and reviewed with a high frequency filter of 70Hz  and a low frequency filter of 1Hz . EEG data were recorded continuously and digitally stored. Description: During awake state, no clear posterior dominant was seen. Sleep was characterized by vertex waves and intermittent generalized 2 to 3 Hz delta slowing.  EEG also showed generalized polymorphic mixed frequencies with predominantly 8-13 Hz alpha-beta activity. Hyperventilation and photic stimulation were not performed. IMPRESSION: This study is within normal limits. No seizures or epileptiform discharges were seen throughout the recording. Lora Havens   ECHOCARDIOGRAM COMPLETE  Result Date: 09/11/2019    ECHOCARDIOGRAM REPORT   Patient Name:   DONEVAN CLYNE Date of Exam: 09/11/2019 Medical Rec #:  [id number]      Height:       69.0 in Accession #:    [id number]     Weight:       150.0 lb Date of  Birth:  Apr 24, 1926      BSA:          1.828 m Patient Age:    25 years       BP:           159/76 mmHg Patient Gender: M  HR:           68 bpm. Exam Location:  Inpatient Procedure: 2D Echo, Cardiac Doppler and Color Doppler Indications:    Stroke 434.91 / I163.9  History:        Patient has no prior history of Echocardiogram examinations.  Sonographer:    Jonelle Sidle Dance Referring Phys: [id number] [place]  1. Left ventricular ejection fraction, by estimation, is 60 to 65%. The left ventricle has normal function. The left ventricle has no regional wall motion abnormalities. Normal left ventricular annular diastolic velocities are consistent with normal left ventricular relaxation properties and normal left atrial pressure. Diminutive atrial contraction-related velocities are suggestive of recent atrial fibrillation with stunning or left atrial mechanical failure.  2. Right ventricular systolic function is normal. The right ventricular size is normal. Tricuspid regurgitation signal is inadequate for assessing PA pressure.  3. Left atrial size was mildly dilated.  4. The mitral valve is normal in structure. Trivial mitral valve regurgitation.  5. The aortic valve is normal in structure. Aortic valve regurgitation is trivial.  6. Aortic dilatation noted. There is mild dilatation of the ascending aorta measuring 40 mm.  7. The inferior vena cava is normal in size with greater than 50% respiratory variability, suggesting right atrial pressure of 3 mmHg. FINDINGS  Left Ventricle: Left ventricular ejection fraction, by estimation, is 60 to 65%. The left ventricle has normal function. The left ventricle has no regional wall motion abnormalities. The left ventricular internal cavity size was normal in size. There is  no left ventricular hypertrophy. Normal left ventricular annular diastolic velocities are consistent with normal left ventricular relaxation properties and normal left atrial pressure.  Diminutive atrial contraction-related velocities are suggestive of recent atrial fibrillation with stunning or left atrial mechanical failure. Right Ventricle: The right ventricular size is normal. No increase in right ventricular wall thickness. Right ventricular systolic function is normal. Tricuspid regurgitation signal is inadequate for assessing PA pressure. Left Atrium: Left atrial size was mildly dilated. Right Atrium: Right atrial size was normal in size. Pericardium: A small pericardial effusion is present. The pericardial effusion is circumferential. There is no evidence of cardiac tamponade. Mitral Valve: The mitral valve is normal in structure. Trivial mitral valve regurgitation. Tricuspid Valve: The tricuspid valve is normal in structure. Tricuspid valve regurgitation is not demonstrated. Aortic Valve: The aortic valve is normal in structure. Aortic valve regurgitation is trivial. Pulmonic Valve: The pulmonic valve was not well visualized. Pulmonic valve regurgitation is not visualized. Aorta: The aortic root is normal in size and structure and aortic dilatation noted. There is mild dilatation of the ascending aorta measuring 40 mm. Venous: The inferior vena cava is normal in size with greater than 50% respiratory variability, suggesting right atrial pressure of 3 mmHg. IAS/Shunts: No atrial level shunt detected by color flow Doppler. Additional Comments: A pacer wire is visualized.  LEFT VENTRICLE PLAX 2D LVIDd:         5.49 cm LV PW:         1.22 cm LV IVS:        1.01 cm LVOT diam:     2.10 cm LV SV:         56 LV SV Index:   31 LVOT Area:     3.46 cm  RIGHT VENTRICLE             IVC RV Basal diam:  2.33 cm     IVC diam: 1.54 cm RV S prime:  12.40 cm/s TAPSE (M-mode): 2.1 cm LEFT ATRIUM             Index       RIGHT ATRIUM           Index LA diam:        4.30 cm 2.35 cm/m  RA Area:     14.10 cm LA Vol (A2C):   65.8 ml 35.99 ml/m RA Volume:   31.30 ml  17.12 ml/m LA Vol (A4C):   55.3 ml 30.25  ml/m LA Biplane Vol: 61.8 ml 33.80 ml/m  AORTIC VALVE LVOT Vmax:   78.00 cm/s LVOT Vmean:  50.950 cm/s LVOT VTI:    0.161 m  AORTA Ao Root diam: 4.00 cm Ao Asc diam:  4.00 cm MITRAL VALVE MV Area (PHT): 1.78 cm    SHUNTS MV Decel Time: 426 msec    Systemic VTI:  0.16 m MV E velocity: 62.10 cm/s  Systemic Diam: 2.10 cm Mihai Croitoru MD Electronically signed by Sanda Klein MD Signature Date/Time: 09/11/2019/10:11:23 AM    Final     Assessment/Plan  Chronic anticoagulation INR today was 3 Coumadin 4 mg 3/week 3 mg 4 /week Repeat INR in 5 days  Mucositis Magic Mouthwash Q 8 for 1 week  Other issues Acute CVA (cerebrovascular accident) ([place]) Acute Embolic BP controlled On Statin On Coumadin Therapy has started  Atrial fibrillation, unspecified type ([place]) Rate Controlled on Toprol On Coumadin HYPERTENSION, BENIGN Controlled on Toprol Hypothyroidism, unspecified type TSH Normal in 5/21 Hyperlipidemia, unspecified hyperlipidemia type Lipitor LDL 77 Slow transit constipation Start on Colace and Prune juice per his request Oropharyngeal dysphagia Modified Diet Working with Geologist, engineering staff Communication:   Labs/tests ordered:

## 2019-09-25 NOTE — Telephone Encounter (Signed)
Almyra Free returning [place] call. Spoke to [place] who told me to send phone note to [place] in regards to scheduling. Sent phone note to [place] with Julie's cell phone number.

## 2019-09-25 NOTE — Telephone Encounter (Signed)
Attempted phone call to Almyra Free at [facility].  Left voicemail message to contact RN or [facility] Dr Olin Pia scheduler at [phone] to schedule appt for pt.

## 2019-09-25 NOTE — Telephone Encounter (Signed)
Appointment has been scheduled for 10/09/2019 with Tommye Standard, PA-C

## 2019-09-27 LAB — CBC: RBC: 4.56 (ref 3.87–5.11)

## 2019-09-27 LAB — BASIC METABOLIC PANEL
BUN: 33 — AB (ref 4–21)
CO2: 26 — AB (ref 13–22)
Chloride: 108 (ref 99–108)
Creatinine: 1 (ref 0.6–1.3)
Glucose: 114
Potassium: 4.1 (ref 3.4–5.3)
Sodium: 142 (ref 137–147)

## 2019-09-27 LAB — COMPREHENSIVE METABOLIC PANEL
Albumin: 3.9 (ref 3.5–5.0)
Calcium: 9.2 (ref 8.7–10.7)
Globulin: 2.5

## 2019-09-27 LAB — HEPATIC FUNCTION PANEL
ALT: 6 — AB (ref 10–40)
AST: 17 (ref 14–40)
Alkaline Phosphatase: 84 (ref 25–125)
Bilirubin, Total: 1

## 2019-09-27 LAB — CBC AND DIFFERENTIAL
HCT: 41 (ref 41–53)
Hemoglobin: 13.8 (ref 13.5–17.5)
Neutrophils Absolute: 4700
Platelets: 186 (ref 150–399)
WBC: 6.2

## 2019-10-02 ENCOUNTER — Inpatient Hospital Stay: Payer: PRIVATE HEALTH INSURANCE | Admitting: Family Medicine

## 2019-10-02 DIAGNOSIS — K5901 Slow transit constipation: Secondary | ICD-10-CM | POA: Diagnosis not present

## 2019-10-02 DIAGNOSIS — I495 Sick sinus syndrome: Secondary | ICD-10-CM | POA: Diagnosis not present

## 2019-10-02 DIAGNOSIS — G459 Transient cerebral ischemic attack, unspecified: Secondary | ICD-10-CM | POA: Diagnosis not present

## 2019-10-02 DIAGNOSIS — R2689 Other abnormalities of gait and mobility: Secondary | ICD-10-CM | POA: Diagnosis not present

## 2019-10-02 DIAGNOSIS — I10 Essential (primary) hypertension: Secondary | ICD-10-CM | POA: Diagnosis not present

## 2019-10-02 DIAGNOSIS — R278 Other lack of coordination: Secondary | ICD-10-CM | POA: Diagnosis not present

## 2019-10-02 DIAGNOSIS — J698 Pneumonitis due to inhalation of other solids and liquids: Secondary | ICD-10-CM | POA: Diagnosis not present

## 2019-10-02 DIAGNOSIS — Z950 Presence of cardiac pacemaker: Secondary | ICD-10-CM | POA: Diagnosis not present

## 2019-10-02 DIAGNOSIS — R918 Other nonspecific abnormal finding of lung field: Secondary | ICD-10-CM | POA: Diagnosis not present

## 2019-10-02 DIAGNOSIS — I744 Embolism and thrombosis of arteries of extremities, unspecified: Secondary | ICD-10-CM | POA: Diagnosis not present

## 2019-10-02 DIAGNOSIS — Z7901 Long term (current) use of anticoagulants: Secondary | ICD-10-CM | POA: Diagnosis not present

## 2019-10-02 DIAGNOSIS — Z7401 Bed confinement status: Secondary | ICD-10-CM | POA: Diagnosis not present

## 2019-10-02 DIAGNOSIS — W19XXXA Unspecified fall, initial encounter: Secondary | ICD-10-CM | POA: Diagnosis not present

## 2019-10-02 DIAGNOSIS — J90 Pleural effusion, not elsewhere classified: Secondary | ICD-10-CM | POA: Diagnosis not present

## 2019-10-02 DIAGNOSIS — F339 Major depressive disorder, recurrent, unspecified: Secondary | ICD-10-CM | POA: Diagnosis not present

## 2019-10-02 DIAGNOSIS — M2550 Pain in unspecified joint: Secondary | ICD-10-CM | POA: Diagnosis not present

## 2019-10-02 DIAGNOSIS — I69328 Other speech and language deficits following cerebral infarction: Secondary | ICD-10-CM | POA: Diagnosis not present

## 2019-10-02 DIAGNOSIS — I959 Hypotension, unspecified: Secondary | ICD-10-CM | POA: Diagnosis not present

## 2019-10-02 DIAGNOSIS — I69391 Dysphagia following cerebral infarction: Secondary | ICD-10-CM | POA: Diagnosis not present

## 2019-10-02 DIAGNOSIS — I69354 Hemiplegia and hemiparesis following cerebral infarction affecting left non-dominant side: Secondary | ICD-10-CM | POA: Diagnosis not present

## 2019-10-02 DIAGNOSIS — I5022 Chronic systolic (congestive) heart failure: Secondary | ICD-10-CM | POA: Diagnosis not present

## 2019-10-02 DIAGNOSIS — E039 Hypothyroidism, unspecified: Secondary | ICD-10-CM | POA: Diagnosis not present

## 2019-10-02 DIAGNOSIS — L602 Onychogryphosis: Secondary | ICD-10-CM | POA: Diagnosis not present

## 2019-10-02 DIAGNOSIS — G8194 Hemiplegia, unspecified affecting left nondominant side: Secondary | ICD-10-CM | POA: Diagnosis not present

## 2019-10-02 DIAGNOSIS — R05 Cough: Secondary | ICD-10-CM | POA: Diagnosis not present

## 2019-10-02 DIAGNOSIS — E785 Hyperlipidemia, unspecified: Secondary | ICD-10-CM | POA: Diagnosis not present

## 2019-10-02 DIAGNOSIS — Z79899 Other long term (current) drug therapy: Secondary | ICD-10-CM | POA: Diagnosis not present

## 2019-10-02 DIAGNOSIS — I63411 Cerebral infarction due to embolism of right middle cerebral artery: Secondary | ICD-10-CM | POA: Diagnosis not present

## 2019-10-02 DIAGNOSIS — I4891 Unspecified atrial fibrillation: Secondary | ICD-10-CM | POA: Diagnosis not present

## 2019-10-02 DIAGNOSIS — R1011 Right upper quadrant pain: Secondary | ICD-10-CM | POA: Diagnosis not present

## 2019-10-02 DIAGNOSIS — M6281 Muscle weakness (generalized): Secondary | ICD-10-CM | POA: Diagnosis not present

## 2019-10-02 DIAGNOSIS — I491 Atrial premature depolarization: Secondary | ICD-10-CM | POA: Diagnosis not present

## 2019-10-02 DIAGNOSIS — R079 Chest pain, unspecified: Secondary | ICD-10-CM | POA: Diagnosis not present

## 2019-10-02 DIAGNOSIS — R279 Unspecified lack of coordination: Secondary | ICD-10-CM | POA: Diagnosis not present

## 2019-10-02 DIAGNOSIS — R0789 Other chest pain: Secondary | ICD-10-CM | POA: Diagnosis not present

## 2019-10-02 DIAGNOSIS — N281 Cyst of kidney, acquired: Secondary | ICD-10-CM | POA: Diagnosis not present

## 2019-10-02 DIAGNOSIS — R2681 Unsteadiness on feet: Secondary | ICD-10-CM | POA: Diagnosis not present

## 2019-10-02 DIAGNOSIS — I639 Cerebral infarction, unspecified: Secondary | ICD-10-CM | POA: Diagnosis not present

## 2019-10-02 DIAGNOSIS — J189 Pneumonia, unspecified organism: Secondary | ICD-10-CM | POA: Diagnosis not present

## 2019-10-02 DIAGNOSIS — I480 Paroxysmal atrial fibrillation: Secondary | ICD-10-CM | POA: Diagnosis not present

## 2019-10-02 DIAGNOSIS — I63311 Cerebral infarction due to thrombosis of right middle cerebral artery: Secondary | ICD-10-CM | POA: Diagnosis not present

## 2019-10-02 DIAGNOSIS — I4892 Unspecified atrial flutter: Secondary | ICD-10-CM | POA: Diagnosis not present

## 2019-10-02 DIAGNOSIS — R1312 Dysphagia, oropharyngeal phase: Secondary | ICD-10-CM | POA: Diagnosis not present

## 2019-10-02 DIAGNOSIS — N400 Enlarged prostate without lower urinary tract symptoms: Secondary | ICD-10-CM | POA: Diagnosis not present

## 2019-10-02 DIAGNOSIS — Q6689 Other  specified congenital deformities of feet: Secondary | ICD-10-CM | POA: Diagnosis not present

## 2019-10-02 DIAGNOSIS — I63112 Cerebral infarction due to embolism of left vertebral artery: Secondary | ICD-10-CM | POA: Diagnosis not present

## 2019-10-02 DIAGNOSIS — R531 Weakness: Secondary | ICD-10-CM | POA: Diagnosis not present

## 2019-10-02 DIAGNOSIS — L84 Corns and callosities: Secondary | ICD-10-CM | POA: Diagnosis not present

## 2019-10-02 DIAGNOSIS — Z8673 Personal history of transient ischemic attack (TIA), and cerebral infarction without residual deficits: Secondary | ICD-10-CM | POA: Diagnosis not present

## 2019-10-03 ENCOUNTER — Non-Acute Institutional Stay (SKILLED_NURSING_FACILITY): Payer: Medicare Other | Admitting: Internal Medicine

## 2019-10-03 ENCOUNTER — Encounter: Payer: Self-pay | Admitting: Internal Medicine

## 2019-10-03 DIAGNOSIS — I4891 Unspecified atrial fibrillation: Secondary | ICD-10-CM

## 2019-10-03 DIAGNOSIS — I639 Cerebral infarction, unspecified: Secondary | ICD-10-CM

## 2019-10-03 DIAGNOSIS — E785 Hyperlipidemia, unspecified: Secondary | ICD-10-CM

## 2019-10-03 DIAGNOSIS — R1312 Dysphagia, oropharyngeal phase: Secondary | ICD-10-CM | POA: Diagnosis not present

## 2019-10-03 NOTE — Progress Notes (Addendum)
Location:    [place] Room Number: 39 Place of Service:  SNF [phone]) Provider:  Veleta Miners MD   [name], MD  Patient Care Team: [name], MD as PCP - General (Internal Medicine) [name], MD (Ophthalmology) [name], MD (Orthopedic Surgery) [name], MD (Cardiology) [name], MD (General Surgery) [name], MD [facility])  Extended Emergency Contact Information Primary Emergency Contact: Cupo,[name] Address: [place], [place] [address] [name] of [place] Phone: [phone] Mobile Phone: [phone] Relation: Spouse Secondary Emergency Contact: Chamberlain, Markson Home Phone: [phone] Relation: Spouse  Code Status:  Full Code Goals of care: Advanced Directive information Advanced Directives 09/19/2019  Does Patient Have a Medical Advance Directive? Yes  Type of Paramedic of [place];Living will  Does patient want to make changes to medical advance directive? No - Patient declined  Copy of Echo in Chart? Yes - validated most recent copy scanned in chart (See row information)  Would patient like information on creating a medical advance directive? -     Chief Complaint  Patient presents with  . Acute Visit    Dysphagia     HPI:  Pt is a 86 y.o. male seen today for an acute visit for dysphagia  Was in the hospital for Acute Right MCA infarct with left Sided weakness from 5/9-5/14  Patient has past medical history of hypertension, hypothyroidism, chronic diastolic CHF, permanent A. fib on Coumadin, traumatic ICH in January 2021 and Colles fracture of right radius, PPM implant.,  History of PMR, hyperlipidemia  Patient presented to emergency room for evaluation of weakness slurred speech and difficulty in walking.  MRI scan showed right subcortical infarct most likely embolic etiology  Now is in SNF for  therapy Patient is on modified diet because of his dysphagia.  But patient states he cannot drink thickened liquid.  He states my quality of life is decreased.  He feels like he is not eating does not have good appetite.  He wanted to discuss his risk of going with normal regular diet.  Past Medical History:  Diagnosis Date  . AF (atrial fibrillation) [facility])    a. s/p RFCA/PVI @ [facility] clinic 2007.  . Arthritis    "joints" (06/21/2013)  . Atrial flutter ([place])    a. 06/2013 s/p TEE/DCCV  . Chronic systolic CHF (congestive heart failure) ([place])    a. 06/2013 Echo: EF 20%, diff HK, mild LVH.  Marland Kitchen History of cardiovascular stress test    Lexiscan Myoview 8/16:  EF 59%, attenuation artifact, no ischemia; Low Risk  . Hyperlipidemia   . Hypertension   . HYPERTENSION, BENIGN 08/21/2009   Qualifier: Diagnosis of  By: Caryl Comes, MD, Remus Blake  Medications(s)  enalapril   . Hypothyroidism   . Neoplasm of uncertain behavior    SKIN  . Polymyalgia rheumatica ([place]) 01/13/2007   Qualifier: History of  By: Danny Lawless CMA, [place]     . Presence of permanent cardiac pacemaker    Past Surgical History:  Procedure Laterality Date  . ATRIAL FIBRILLATION ABLATION  2007  . CARDIOVERSION N/A 06/22/2013   Procedure: CARDIOVERSION;  Surgeon: Sueanne Margarita, MD;  Location: [facility] ENDOSCOPY;  Service: Cardiovascular;  Laterality: N/A;  15:17 Lido 20mg ,IV , Propofol 30 mg, IV synched cardioversion @ 150 joules by Dr. Lajoyce Lauber from a flutter to sinis brady which is baseline  for this pt, reports 45-50 reg HR verified by Dr. Ashok Norris  . CARDIOVERSION N/A 11/21/2014   Procedure: CARDIOVERSION;  Surgeon: Fay Records, MD;  Location: [facility] ENDOSCOPY;  Service: Cardiovascular;  Laterality: N/A;  . CARDIOVERSION N/A 07/14/2015   Procedure: CARDIOVERSION;  Surgeon: Sueanne Margarita, MD;  Location: [place];  Service: Cardiovascular;  Laterality: N/A;  . CATARACT EXTRACTION, BILATERAL Bilateral Summer 2009  . CHOLECYSTECTOMY      . EP IMPLANTABLE DEVICE N/A 11/10/2015   Procedure: Pacemaker Implant;  Surgeon: [name], MD;  Location: [place] CV LAB;  Service: Cardiovascular;  Laterality: N/A;  . HAND SURGERY Right    "broke bone; grafted area w/bone from somewhere else"  . HEMORRHOID SURGERY    . INGUINAL HERNIA REPAIR Bilateral   . TEE WITHOUT CARDIOVERSION N/A 06/22/2013   Procedure: TRANSESOPHAGEAL ECHOCARDIOGRAM (TEE);  Surgeon: Sueanne Margarita, MD;  Location: [facility] ENDOSCOPY;  Service: Cardiovascular;  Laterality: N/A;  . TEE WITHOUT CARDIOVERSION N/A 07/14/2015   Procedure: TRANSESOPHAGEAL ECHOCARDIOGRAM (TEE);  Surgeon: Sueanne Margarita, MD;  Location: [facility] ENDOSCOPY;  Service: Cardiovascular;  Laterality: N/A;  . TONSILLECTOMY      No Known Allergies  Allergies as of 10/03/2019   No Known Allergies     Medication List       Accurate as of October 03, 2019 10:17 AM. If you have any questions, ask your nurse or doctor.        STOP taking these medications   warfarin 2 MG tablet Commonly known as: COUMADIN Stopped by: [name], MD   warfarin 3 MG tablet Commonly known as: COUMADIN Stopped by: [name], MD     TAKE these medications   antiseptic oral rinse Liqd 15 mLs by Mouth Rinse route as needed for dry mouth.   atorvastatin 40 MG tablet Commonly known as: LIPITOR Take 40 mg by mouth daily.   carboxymethylcellulose 0.5 % Soln Commonly known as: REFRESH PLUS 1 drop every 4 (four) hours as needed. Each eye   docusate sodium 100 MG capsule Commonly known as: COLACE Take 200 mg by mouth daily.   glucosamine-chondroitin 500-400 MG tablet Take 1 tablet by mouth 3 (three) times daily.   levothyroxine 50 MCG tablet Commonly known as: SYNTHROID Take 1 tablet (50 mcg total) by mouth daily before breakfast.   metoprolol succinate 25 MG 24 hr tablet Commonly known as: TOPROL-XL Take 12.5 mg by mouth daily.   multivitamin with minerals Tabs tablet Take 1 tablet by mouth daily.  Centrum   sennosides-docusate sodium 8.6-50 MG tablet Commonly known as: SENOKOT-S Take 2 tablets by mouth daily.       Review of Systems  Constitutional: Positive for activity change, appetite change and unexpected weight change.  HENT: Negative.   Respiratory: Positive for cough.   Cardiovascular: Negative.   Gastrointestinal: Positive for constipation.  Genitourinary: Negative.   Musculoskeletal: Positive for gait problem.  Skin: Negative.   Neurological: Positive for weakness.  Psychiatric/Behavioral: Positive for dysphoric mood.    Immunization History  Administered Date(s) Administered  . Influenza Split 01/25/2012  . Influenza Whole 02/08/2008, 03/12/2009, 01/16/2010  . Influenza, High Dose Seasonal PF 01/24/2015, 03/04/2017, 02/16/2018, 12/29/2018  . Influenza,inj,Quad PF,6+ Mos 01/25/2013, 01/23/2014  . Influenza-Unspecified 01/02/2016  . PFIZER SARS-COV-2 Vaccination 05/22/2019, 06/12/2019  . Pneumococcal Conjugate-13 01/25/2013  . Pneumococcal Polysaccharide-23 05/28/2016  . Td 01/16/2010  . Tdap 05/06/2019   Pertinent  Health Maintenance Due  Topic Date Due  . INFLUENZA  VACCINE  12/02/2019  . PNA vac Low Risk Adult  Completed   Fall Risk  03/23/2019 03/17/2018 02/16/2018 05/28/2016 04/08/2016  Falls in the past year? 0 1 No No No  Comment [facility] Telephone Survey: data to providers prior to load [employer] Telephone Survey: data to providers prior to load - - [facility] Telephone Survey: data to providers prior to load  Number falls in past yr: - 1 - - -  Comment - [facility] Telephone Survey Actual Response = 2 - - -  Injury with Fall? - 1 - - -   Functional Status Survey:    Vitals:   10/03/19 1002  BP: (!) 153/89  Pulse: 67  Resp: 20  Temp: (!) 97.1 F (36.2 C)  SpO2: 98%  Weight: 139 lb 6.4 oz (63.2 kg)  Height: 5\' 9"  (1.753 m)   Body mass index is 20.59 kg/m. Physical Exam Vitals reviewed.  Constitutional:      Appearance: Normal appearance.  HENT:      Head: Normocephalic.     Nose: Nose normal.     Mouth/Throat:     Mouth: Mucous membranes are moist.     Pharynx: Oropharynx is clear.  Eyes:     Pupils: Pupils are equal, round, and reactive to light.  Cardiovascular:     Rate and Rhythm: Normal rate. Rhythm irregular.     Pulses: Normal pulses.  Pulmonary:     Effort: Pulmonary effort is normal.     Breath sounds: Normal breath sounds.  Abdominal:     General: Abdomen is flat. Bowel sounds are normal.     Palpations: Abdomen is soft.  Musculoskeletal:        General: No swelling.     Cervical back: Neck supple.  Skin:    General: Skin is warm.  Neurological:     Mental Status: He is alert and oriented to person, place, and time.     Comments: Left Facial Dropp   Psychiatric:        Mood and Affect: Mood normal.        Thought Content: Thought content normal.     Labs reviewed: Recent Labs    09/09/19 1138 09/10/19 0621 09/12/19 0237 09/12/19 0237 09/13/19 0454 09/14/19 0301 09/27/19 0000  NA 141   < > 140   < > 140 141 142  K 4.5   < > 4.2   < > 3.9 4.2 4.1  CL 104   < > 103   < > 106 107 108  CO2 28   < > 26   < > 26 25 26*  GLUCOSE 104*   < > 104*  --  109* 118*  --   BUN 16   < > 28*   < > 33* 33* 33*  CREATININE 0.98   < > 1.17   < > 1.26* 1.14 1.0  CALCIUM 9.3   < > 8.9   < > 8.8* 8.9 9.2  MG 2.0  --   --   --   --   --   --    < > = values in this interval not displayed.   Recent Labs    10/04/18 0834 10/04/18 0834 05/07/19 0512 09/09/19 1138 09/27/19 0000  AST 16   < > 31 21 17   ALT 5   < > 8 10 6*  ALKPHOS 76   < > 59 72 84  BILITOT 0.8  --  1.3* 1.0  --  PROT 6.9  --  6.0* 6.6  --   ALBUMIN 4.3   < > 3.4* 3.8 3.9   < > = values in this interval not displayed.   Recent Labs    05/06/19 1320 05/07/19 0512 09/09/19 1138 09/10/19 0621 09/12/19 0237 09/12/19 0237 09/13/19 0454 09/14/19 0301 09/27/19 0000  WBC 7.8   < > 4.2   < > 6.2   < > 5.6 5.4 6.2  NEUTROABS 6.5  --  3.0  --    --   --   --   --  4,700  HGB 13.6   < > 13.4   < > 13.1   < > 12.6* 12.9* 13.8  HCT 42.6   < > 42.2   < > 40.5   < > 38.4* 39.3 41  MCV 96.4   < > 95.3   < > 93.3  --  93.9 94.0  --   PLT 155   < > 134*   < > 124*   < > 128* 133* 186   < > = values in this interval not displayed.   Lab Results  Component Value Date   TSH 3.939 09/12/2019   Lab Results  Component Value Date   HGBA1C 5.5 09/10/2019   Lab Results  Component Value Date   CHOL 133 09/10/2019   HDL 41 09/10/2019   LDLCALC 77 09/10/2019   TRIG 73 09/10/2019   CHOLHDL 3.2 09/10/2019    Significant Diagnostic Results in last 30 days:  CT Angio Head W or Wo Contrast  Result Date: 09/10/2019 CLINICAL DATA:  Follow-up examination for acute stroke. EXAM: CT ANGIOGRAPHY HEAD AND NECK TECHNIQUE: Multidetector CT imaging of the head and neck was performed using the standard protocol during bolus administration of intravenous contrast. Multiplanar CT image reconstructions and MIPs were obtained to evaluate the vascular anatomy. Carotid stenosis measurements (when applicable) are obtained utilizing NASCET criteria, using the distal internal carotid diameter as the denominator. CONTRAST:  40mL OMNIPAQUE IOHEXOL 350 MG/ML SOLN COMPARISON:  Prior head CT from earlier the same day. FINDINGS: CT HEAD FINDINGS Brain: Generalized age-related cerebral atrophy with chronic small vessel ischemic disease. Few scatter remote lacunar infarcts noted about the bilateral basal ganglia. Small chronic right cerebellar infarct. No acute intracranial hemorrhage. No acute large vessel territory infarct. No mass lesion, midline shift or mass effect. Mild ventricular prominence related to global parenchymal volume loss without hydrocephalus. No extra-axial fluid collection. Vascular: No hyperdense vessel. Calcified atherosclerosis present at skull base. Skull: Scalp soft tissues and calvarium within normal limits. Sinuses: Mild chronic mucoperiosteal thickening  noted within the ethmoidal air cells. Paranasal sinuses are otherwise clear. No mastoid effusion. Orbits: Globes and orbital soft tissues within normal limits. Review of the MIP images confirms the above findings CTA NECK FINDINGS Aortic arch: Visualized aortic arch of normal caliber with normal 3 vessel morphology. Moderate atherosclerotic change about the arch and origin of the great vessels without hemodynamically significant stenosis. Visualized subclavian arteries patent bilaterally. Right carotid system: Right common carotid artery patent from its origin to the bifurcation without stenosis. Mild scattered calcified plaque about the right bifurcation/proximal right ICA without hemodynamically significant stenosis. Right ICA patent distally to the skull base without stenosis, dissection or occlusion. Left carotid system: Scattered calcified plaque within the left CCA without hemodynamically significant stenosis. Concentric predominantly soft plaque about the left bifurcation with associated stenosis of up to 55% by NASCET criteria. Left ICA patent distally to the skull base without  stenosis, dissection or occlusion. Vertebral arteries: Both vertebral arteries arise from the subclavian arteries. Right vertebral artery dominant. Focal plaque at the origin of the right vertebral artery with relatively mild stenosis. Scattered atheromatous irregularity within the vertebral arteries distally without additional stenosis, dissection, or occlusion. Skeleton: No acute osseous abnormality. No discrete or worrisome osseous lesions. Moderate cervical spondylosis present at C4-5 and C5-6. Other neck: No other acute soft tissue abnormality within the neck. No mass lesion or adenopathy. Upper chest: Visualized upper chest demonstrates no acute finding. Mild centrilobular emphysema. Left-sided pacemaker/AICD in place. Review of the MIP images confirms the above findings CTA HEAD FINDINGS Anterior circulation: Petrous segments  widely patent bilaterally. Scattered calcified plaque throughout the cavernous/supraclinoid ICAs with associated moderate multifocal stenosis. A1 segments patent bilaterally. Normal anterior communicating artery complex. Anterior cerebral arteries patent to their distal aspects without stenosis. Short-segment mild to moderate stenosis noted within the mid right M1 segment (series 12, image 22). M1 segments otherwise patent bilaterally. Normal MCA bifurcations. Distal MCA branches well perfused and symmetric. Diffuse small vessel atheromatous irregularity. Posterior circulation: Multifocal atheromatous irregularity throughout the dominant right V4 segment without high-grade stenosis. Right PICA patent. Multifocal severe near occlusive stenoses seen involving the diminutive left V4 segment, which nearly occludes by the vertebrobasilar junction. Left PICA patent. Moderate irregular stenosis seen involving the proximal basilar artery. Basilar diffusely irregular but otherwise patent to its distal aspect without high-grade stenosis. Superior cerebral arteries patent bilaterally. Predominant fetal type origin of both PCAs supplied via hypoplastic P1 segments and robust bilateral posterior communicating arteries. Right PCA patent to its distal aspect without high-grade stenosis. Short-segment moderate mid left P2 stenosis noted. Left PCA otherwise patent. Venous sinuses: Patent. Anatomic variants: Predominant fetal type origin of the PCAs. No intracranial aneurysm. Review of the MIP images confirms the above findings IMPRESSION: CT HEAD IMPRESSION: 1. Stable head CT.  No acute intracranial abnormality identified. 2. Age-related cerebral atrophy with chronic small vessel ischemic disease with scattered chronic lacunar infarcts as above. CTA HEAD AND NECK IMPRESSION: 1. Negative CTA for large vessel occlusion. 2. 50% atheromatous stenosis about the left carotid bifurcation. 3. Moderate multifocal atheromatous irregularity  throughout the intracranial circulation as above. Notable findings include moderate multifocal stenoses about the carotid siphons, mild to moderate mid right M1 stenosis, multifocal severe near occlusive these for stenoses, irregular moderate stenosis of the proximal basilar artery, with moderate left P2 stenosis. 4.  Emphysema (ICD10-J43.9). Electronically Signed   By: Jeannine Boga M.D.   On: 09/10/2019 01:20   CT HEAD WO CONTRAST  Result Date: 09/12/2019 CLINICAL DATA:  Follow-up stroke. EXAM: CT HEAD WITHOUT CONTRAST TECHNIQUE: Contiguous axial images were obtained from the base of the skull through the vertex without intravenous contrast. COMPARISON:  Sep 10, 2019 and Sep 09, 2019. FINDINGS: Brain: Increased low density is noted in the right insular subcortical white matter consistent with acute infarction as described on prior MRI. No mass effect or midline shift is noted. Ventricular size is within normal limits. No hemorrhage is noted. No definite mass lesion is noted. Vascular: No hyperdense vessel or unexpected calcification. Skull: Normal. Negative for fracture or focal lesion. Sinuses/Orbits: No acute finding. Other: None. IMPRESSION: Increased low density is noted in the right insular subcortical white matter consistent with acute infarction as described on prior MRI. No mass effect or midline shift is noted. No hemorrhage is noted. Electronically Signed   By: Marijo Conception M.D.   On: 09/12/2019 11:43   CT Head  Wo Contrast  Result Date: 09/09/2019 CLINICAL DATA:  Encephalopathy. Additional history provided: Generalized weakness, poor appetite, increased urinary frequency, bladder incontinence. Patient's wife reports slurred speech since Friday and confusion. Patient reports weakness. EXAM: CT HEAD WITHOUT CONTRAST TECHNIQUE: Contiguous axial images were obtained from the base of the skull through the vertex without intravenous contrast. COMPARISON:  Noncontrast head CT 05/07/2019  FINDINGS: Brain: Ill-defined hypoattenuation within the cerebral white matter is nonspecific, but consistent with chronic small vessel ischemic disease. Redemonstrated chronic lacunar infarcts within the left caudate head and right cerebellum. Moderate generalized parenchymal atrophy. There is no acute intracranial hemorrhage. No demarcated cortical infarct. No extra-axial fluid collection. No evidence of intracranial mass. No midline shift. Vascular: No hyperdense vessel.  Atherosclerotic calcifications. Skull: Normal. Negative for fracture or focal lesion. Sinuses/Orbits: Visualized orbits show no acute finding. Minimal ethmoid and maxillary sinus mucosal thickening. No significant mastoid effusion. IMPRESSION: 1. No evidence of acute intracranial abnormality. 2. Stable generalized parenchymal atrophy and chronic small vessel ischemic disease. Redemonstrated chronic lacunar infarcts within the left caudate head and right cerebellum. 3. Minimal ethmoid and maxillary sinus mucosal thickening. Electronically Signed   By: Kellie Simmering DO   On: 09/09/2019 11:33   CT Angio Neck W and/or Wo Contrast  Result Date: 09/10/2019 CLINICAL DATA:  Follow-up examination for acute stroke. EXAM: CT ANGIOGRAPHY HEAD AND NECK TECHNIQUE: Multidetector CT imaging of the head and neck was performed using the standard protocol during bolus administration of intravenous contrast. Multiplanar CT image reconstructions and MIPs were obtained to evaluate the vascular anatomy. Carotid stenosis measurements (when applicable) are obtained utilizing NASCET criteria, using the distal internal carotid diameter as the denominator. CONTRAST:  57mL OMNIPAQUE IOHEXOL 350 MG/ML SOLN COMPARISON:  Prior head CT from earlier the same day. FINDINGS: CT HEAD FINDINGS Brain: Generalized age-related cerebral atrophy with chronic small vessel ischemic disease. Few scatter remote lacunar infarcts noted about the bilateral basal ganglia. Small chronic right  cerebellar infarct. No acute intracranial hemorrhage. No acute large vessel territory infarct. No mass lesion, midline shift or mass effect. Mild ventricular prominence related to global parenchymal volume loss without hydrocephalus. No extra-axial fluid collection. Vascular: No hyperdense vessel. Calcified atherosclerosis present at skull base. Skull: Scalp soft tissues and calvarium within normal limits. Sinuses: Mild chronic mucoperiosteal thickening noted within the ethmoidal air cells. Paranasal sinuses are otherwise clear. No mastoid effusion. Orbits: Globes and orbital soft tissues within normal limits. Review of the MIP images confirms the above findings CTA NECK FINDINGS Aortic arch: Visualized aortic arch of normal caliber with normal 3 vessel morphology. Moderate atherosclerotic change about the arch and origin of the great vessels without hemodynamically significant stenosis. Visualized subclavian arteries patent bilaterally. Right carotid system: Right common carotid artery patent from its origin to the bifurcation without stenosis. Mild scattered calcified plaque about the right bifurcation/proximal right ICA without hemodynamically significant stenosis. Right ICA patent distally to the skull base without stenosis, dissection or occlusion. Left carotid system: Scattered calcified plaque within the left CCA without hemodynamically significant stenosis. Concentric predominantly soft plaque about the left bifurcation with associated stenosis of up to 55% by NASCET criteria. Left ICA patent distally to the skull base without stenosis, dissection or occlusion. Vertebral arteries: Both vertebral arteries arise from the subclavian arteries. Right vertebral artery dominant. Focal plaque at the origin of the right vertebral artery with relatively mild stenosis. Scattered atheromatous irregularity within the vertebral arteries distally without additional stenosis, dissection, or occlusion. Skeleton: No acute  osseous abnormality. No  discrete or worrisome osseous lesions. Moderate cervical spondylosis present at C4-5 and C5-6. Other neck: No other acute soft tissue abnormality within the neck. No mass lesion or adenopathy. Upper chest: Visualized upper chest demonstrates no acute finding. Mild centrilobular emphysema. Left-sided pacemaker/AICD in place. Review of the MIP images confirms the above findings CTA HEAD FINDINGS Anterior circulation: Petrous segments widely patent bilaterally. Scattered calcified plaque throughout the cavernous/supraclinoid ICAs with associated moderate multifocal stenosis. A1 segments patent bilaterally. Normal anterior communicating artery complex. Anterior cerebral arteries patent to their distal aspects without stenosis. Short-segment mild to moderate stenosis noted within the mid right M1 segment (series 12, image 22). M1 segments otherwise patent bilaterally. Normal MCA bifurcations. Distal MCA branches well perfused and symmetric. Diffuse small vessel atheromatous irregularity. Posterior circulation: Multifocal atheromatous irregularity throughout the dominant right V4 segment without high-grade stenosis. Right PICA patent. Multifocal severe near occlusive stenoses seen involving the diminutive left V4 segment, which nearly occludes by the vertebrobasilar junction. Left PICA patent. Moderate irregular stenosis seen involving the proximal basilar artery. Basilar diffusely irregular but otherwise patent to its distal aspect without high-grade stenosis. Superior cerebral arteries patent bilaterally. Predominant fetal type origin of both PCAs supplied via hypoplastic P1 segments and robust bilateral posterior communicating arteries. Right PCA patent to its distal aspect without high-grade stenosis. Short-segment moderate mid left P2 stenosis noted. Left PCA otherwise patent. Venous sinuses: Patent. Anatomic variants: Predominant fetal type origin of the PCAs. No intracranial aneurysm. Review  of the MIP images confirms the above findings IMPRESSION: CT HEAD IMPRESSION: 1. Stable head CT.  No acute intracranial abnormality identified. 2. Age-related cerebral atrophy with chronic small vessel ischemic disease with scattered chronic lacunar infarcts as above. CTA HEAD AND NECK IMPRESSION: 1. Negative CTA for large vessel occlusion. 2. 50% atheromatous stenosis about the left carotid bifurcation. 3. Moderate multifocal atheromatous irregularity throughout the intracranial circulation as above. Notable findings include moderate multifocal stenoses about the carotid siphons, mild to moderate mid right M1 stenosis, multifocal severe near occlusive these for stenoses, irregular moderate stenosis of the proximal basilar artery, with moderate left P2 stenosis. 4.  Emphysema (ICD10-J43.9). Electronically Signed   By: Jeannine Boga M.D.   On: 09/10/2019 01:20   MR BRAIN WO CONTRAST  Result Date: 09/10/2019 CLINICAL DATA:  Generalized weakness. Mental status changes. Negative CT evaluation. EXAM: MRI HEAD WITHOUT CONTRAST TECHNIQUE: Multiplanar, multiecho pulse sequences of the brain and surrounding structures were obtained without intravenous contrast. COMPARISON:  CT angiography 09/09/2019. FINDINGS: Brain: Diffusion imaging shows a small region of acute infarction affecting the right deep insular cortex in the underlying radiating white matter tracts. No evidence of hemorrhage or mass effect. No other acute infarction. Elsewhere, there are old small vessel ischemic changes of the pons. Old small vessel cerebellar infarction on the right. Moderate changes of chronic small vessel disease of the cerebral hemispheric white matter. No mass lesion, recent hemorrhage, hydrocephalus or extra-axial collection. Small focus of hemosiderin deposition in the right frontal white matter related to an old insult. Vascular: Flow is present within the major vessels at the base of the brain, with the exception of the left  vertebral artery which shows abnormal flow, better demonstrated at CT angiography. Skull and upper cervical spine: Negative Sinuses/Orbits: Clear/normal Other: None IMPRESSION: Acute infarction in the right deep insula and radiating white matter tracts. No mass effect or hemorrhage. Atrophy and chronic small-vessel ischemic changes affecting the brain elsewhere as described. Old right frontal white matter hemorrhage. Abnormal flow in the  distal left vertebral artery as shown by CT angiography, presumably unrelated to the acute insult. Electronically Signed   By: Nelson Chimes M.D.   On: 09/10/2019 14:17   DG CHEST PORT 1 VIEW  Result Date: 09/10/2019 CLINICAL DATA:  Adjustment Management of cardiac pacemaker EXAM: PORTABLE CHEST 1 VIEW COMPARISON:  09/09/2019 FINDINGS: There is no focal consolidation. There is no pleural effusion or pneumothorax. The heart and mediastinal contours are unremarkable. There is thoracic aortic atherosclerosis. There is a dual lead cardiac pacemaker with the leads in satisfactory unchanged position on a single frontal AP view. There is no acute osseous abnormality. There is moderate osteoarthritis of the right glenohumeral joint. IMPRESSION: No active disease. Electronically Signed   By: Kathreen Devoid   On: 09/10/2019 11:05   DG Chest Portable 1 View  Result Date: 09/09/2019 CLINICAL DATA:  Weakness. EXAM: PORTABLE CHEST 1 VIEW COMPARISON:  May 06, 2019 FINDINGS: Stable cardiac pacemaker/defibrillator. Enlarged cardiac silhouette. Calcific atherosclerotic disease of the aorta. There is no evidence of focal airspace consolidation, pleural effusion or pneumothorax. Osseous structures are without acute abnormality. Soft tissues are grossly normal. IMPRESSION: Enlarged cardiac silhouette, otherwise no acute process. Electronically Signed   By: Fidela Salisbury M.D.   On: 09/09/2019 11:36   DG Swallowing Func-Speech Pathology  Result Date: 09/12/2019 Objective Swallowing  Evaluation: Type of Study: MBS-Modified Barium Swallow Study  Patient Details Name: CHARLENE MURRIE MRN: [id number] Date of Birth: 1925/09/19 Today's Date: 09/12/2019 Time: SLP Start Time (ACUTE ONLY): 1155 -SLP Stop Time (ACUTE ONLY): 1215 SLP Time Calculation (min) (ACUTE ONLY): 20 min Past Medical History: Past Medical History: Diagnosis Date . Hypertension  . Hypothyroidism  . Presence of permanent cardiac pacemaker  Past Surgical History: No past surgical history on file. HPI: 86 yo male adm to [facility] with weakness, dysarthria and fall with concern for acute CVA.  Pt CT scan head showed chronic lacunar CVAs x2 basal ganglia, small right cerebellar CVA.  MRI showed insular CVA. Concern for worsening symptoms, so new CT has been ordered to r/o extension.  Subjective: Pt upright in MBS chair, agreeable to study. Assessment / Plan / Recommendation CHL IP CLINICAL IMPRESSIONS 09/12/2019 Clinical Impression Mr. Nannini was seen for a repeat MBS with concern for worsening swallow function compared to two days prior (see MBS note 5/10). Pt does demonstrate a mildly worsened dysphagia (moderate-severe pharyngeal dysphagia) with increased pharyngeal weakness. Oral phase remains mildly impaired and c/b reduced lingual control for bolus cohesion, resulting in trace buccal residue on L. He does not appear sensate to this residue. Pharyngeally, pt also demonstrates reduced sensation with absent swallow trigger noted intermittently with bolus resting in valleculae/pyriform sinus, requiring a cue to swallow. Pt demonstrates further reduced hyoid excursion, laryngeal elevation and pharyngeal constriction, which results in increased residue in pharynx around pyriform sinus, which was noted to spill into airway with subsequent swallows. Pt had trace aspiration noted with thin liquids via spoon/cup, nectar thick liquids via cup/straw, honey thick liquids via cup. Most instances of aspiration were from residue (versus from the initial  swallow). He was only noted to sense aspiration x1 with cough response. Pt naturally lowers chin, which greatly worsens the aspiration. With pt being told to take small sips, he begins well, with use of small sips, but then takes larger sips, resulting in aspiration. Given use of spoon, pt still had some penetration from residue after the swallow, but no aspiration was visualized. He had no penetration/aspiration of purees or ground  solids, but does continue with trace-mild residue in valleculae and pyriform sinus. Overall, pt is at high risk for aspiration/aspiration PNA, however, given use of spoon with nectar thick liquids and ground solids, pt was seen with improved performance. D/t high risk of dehydration on this nectar thick (spoon) consistency, recommend he continue with free water protocol after oral care. Water may be consumed by cup. Pt thoroughly educated to continue prior precautions of coughing intermittently and keeping head in neutral position. He demonstrated understanding and agreement. SLP Visit Diagnosis Dysphagia, oropharyngeal phase (R13.12) Impact on safety and function Moderate aspiration risk;Severe aspiration risk   CHL IP TREATMENT RECOMMENDATION 09/10/2019 Treatment Recommendations Defer until completion of intrumental exam   Prognosis 09/12/2019 Prognosis for Safe Diet Advancement Fair Barriers to Reach Goals Severity of deficits CHL IP DIET RECOMMENDATION 09/12/2019 SLP Diet Recommendations Nectar thick liquid;Dysphagia 2 (Fine chop) solids Liquid Administration via Spoon Medication Administration Whole meds with puree Compensations Small sips/bites;Hard cough after swallow Postural Changes Neutral/normal head position   CHL IP OTHER RECOMMENDATIONS 09/12/2019 Recommended Consults n/a Oral Care Recommendations Oral care BID Other Recommendations    CHL IP FOLLOW UP RECOMMENDATIONS 09/12/2019 Follow up Recommendations Skilled Nursing facility   [facility] IP FREQUENCY AND DURATION 09/12/2019 Speech  Therapy Frequency (ACUTE ONLY) min 2x/week Treatment Duration 1 week      CHL IP ORAL PHASE 09/12/2019 Oral Phase Impaired Oral - Thin Cup Piecemeal swallowing Oral - Thin Straw NT Oral - Puree Piecemeal swallowing Oral - Mech Soft Left pocketing in lateral sulci  CHL IP PHARYNGEAL PHASE 09/12/2019 Pharyngeal Phase Impaired Pharyngeal- Honey Cup Delayed swallow initiation-pyriform sinuses;Reduced pharyngeal peristalsis;Reduced epiglottic inversion;Reduced anterior laryngeal mobility;Reduced laryngeal elevation;Reduced airway/laryngeal closure;Reduced tongue base retraction;Penetration/Apiration after swallow;Trace aspiration;Pharyngeal residue - cp segment  Pharyngeal Material enters airway, passes BELOW cords without attempt by patient to eject out (silent aspiration) Pharyngeal- Nectar Teaspoon Delayed swallow initiation-pyriform sinuses;Reduced pharyngeal peristalsis;Reduced epiglottic inversion;Reduced anterior laryngeal mobility;Reduced laryngeal elevation;Reduced airway/laryngeal closure;Reduced tongue base retraction;Penetration/Apiration after swallow;Trace aspiration;Pharyngeal residue - cp segment Pharyngeal Material does not enter airway Pharyngeal- Nectar Cup Delayed swallow initiation-pyriform sinuses;Reduced pharyngeal peristalsis;Reduced epiglottic inversion;Reduced anterior laryngeal mobility;Reduced laryngeal elevation;Reduced airway/laryngeal closure;Reduced tongue base retraction;Penetration/Apiration after swallow;Trace aspiration;Pharyngeal residue - cp segment Pharyngeal Material enters airway, passes BELOW cords without attempt by patient to eject out (silent aspiration) Pharyngeal- Nectar Straw Delayed swallow initiation-pyriform sinuses;Reduced pharyngeal peristalsis;Reduced epiglottic inversion;Reduced anterior laryngeal mobility;Reduced laryngeal elevation;Reduced airway/laryngeal closure;Reduced tongue base retraction;Penetration/Apiration after swallow;Trace aspiration;Pharyngeal residue -  cp segment Pharyngeal Material enters airway, passes BELOW cords without attempt by patient to eject out (silent aspiration) Pharyngeal- Thin Teaspoon Penetration/Aspiration before swallow Pharyngeal Material enters airway, CONTACTS cords and not ejected out; Material enters airway, passes BELOW cords without attempt by patient to eject out (silent aspiration) Pharyngeal- Thin Cup Pharyngeal residue - cp segment;Pharyngeal residue - pyriform;Trace aspiration;Penetration/Aspiration before swallow;Penetration/Apiration after swallow;Reduced pharyngeal peristalsis;Reduced epiglottic inversion;Reduced anterior laryngeal mobility;Reduced laryngeal elevation;Delayed swallow initiation-pyriform sinuses Pharyngeal Material enters airway, passes BELOW cords without attempt by patient to eject out (silent aspiration) Pharyngeal- Thin Straw NT Pharyngeal Material enters airway, passes BELOW cords without attempt by patient to eject out (silent aspiration) Pharyngeal- Puree Delayed swallow initiation-vallecula;Reduced pharyngeal peristalsis;Reduced tongue base retraction;Pharyngeal residue - valleculae;Pharyngeal residue - pyriform Pharyngeal Material does not enter airway Pharyngeal- Mechanical Soft Reduced epiglottic inversion;Reduced pharyngeal peristalsis;Delayed swallow initiation-vallecula;Pharyngeal residue - pyriform;Pharyngeal residue - valleculae  CHL IP CERVICAL ESOPHAGEAL PHASE 09/10/2019 Cervical Esophageal Phase [facility] P. Isenhour, M.S., CCC-SLP Speech-Language Pathologist Acute Rehabilitation Services Pager: [place] 09/12/2019,  1:48 PM              DG Swallowing Func-Speech Pathology  Result Date: 09/10/2019 Objective Swallowing Evaluation: Type of Study: MBS-Modified Barium Swallow Study  Patient Details Name: KAMORI OREBAUGH MRN: [id number] Date of Birth: March 17, 1926 Today's Date: 09/10/2019 Time: SLP Start Time (ACUTE ONLY): 0919 -SLP Stop Time (ACUTE ONLY): 0940 SLP Time Calculation  (min) (ACUTE ONLY): 21 min Past Medical History: Past Medical History: Diagnosis Date . Hypertension  . Hypothyroidism  . Presence of permanent cardiac pacemaker  Past Surgical History: No past surgical history on file. HPI: 86 yo male adm to [facility] with weakness, dysarthria and fall with concern for acute CVA. Pt has h/o falling and hitting his head.  Pt CT scan head showed chronic lacunar CVAs x2 basal ganglia, small right cerebellar CVA.  Pt has h/o Bell's palsy 20 years prior, remote smoking/cigar use ceasing 40 years ago.  He failed a Audiological scientist and SLP was ordered. MRI pending/delayed due to pacemaker.  Subjective: pt awake in chair Assessment / Plan / Recommendation CHL IP CLINICAL IMPRESSIONS 09/10/2019 Clinical Impression Patient presentn with moderate oropharyngeal dysphagia with sensorimotor difficulties result in discoordination with premature spillage of boluses into pharynx/larynx before swallow triggered.  Pooling to pyriform sinus noted up to 6 seconds with thin liquids.  Chin tuck posture allowed SILENT aspiration of thin and nectar consistencies - and worsened airway protection.  Control of bolus strictly to small single cup sip prevented aspiration of nectar.  Pt did have very delayed cough after aspirates - large amount of aspiration but did not clear aspirates.  Head turn left did not prevent aspiration nor decrease residuals.  Pt with mild vallecular retention with liquids and puree- Cues to "hock" helpful to decrease x1.  There were also 2 occasions where pt thought he had swallowed and he had not - initiation difficulties noted. Recommend pt have dys3/ground meat/nectar diet and allow thin water between meals after mouth care for hydration/QOL.  Using teach back, educated pt to findings/recommendations with live monitor.  Will follow up for dysphagia management.    SLP Visit Diagnosis Dysphagia, oropharyngeal phase (R13.12) Attention and concentration deficit following -- Frontal lobe and executive  function deficit following -- Impact on safety and function Moderate aspiration risk   CHL IP TREATMENT RECOMMENDATION 09/10/2019 Treatment Recommendations Defer until completion of intrumental exam   Prognosis 09/10/2019 Prognosis for Safe Diet Advancement Good Barriers to Reach Goals -- Barriers/Prognosis Comment -- CHL IP DIET RECOMMENDATION 09/10/2019 SLP Diet Recommendations Dysphagia 3 (Mech soft) solids;Nectar thick liquid Liquid Administration via Spoon;Cup;No straw Medication Administration Whole meds with puree Compensations Slow rate;Small sips/bites;Effortful swallow;Other (Comment) Postural Changes Remain semi-upright after after feeds/meals (Comment);Seated upright at 90 degrees   CHL IP OTHER RECOMMENDATIONS 09/10/2019 Recommended Consults -- Oral Care Recommendations Oral care QID Other Recommendations Order thickener from pharmacy;Have oral suction available   No flowsheet data found.  No flowsheet data found.     CHL IP ORAL PHASE 09/10/2019 Oral Phase Impaired Oral - Pudding Teaspoon -- Oral - Pudding Cup -- Oral - Honey Teaspoon -- Oral - Honey Cup -- Oral - Nectar Teaspoon Delayed oral transit;Decreased bolus cohesion;Premature spillage Oral - Nectar Cup Delayed oral transit;Decreased bolus cohesion;Premature spillage Oral - Nectar Straw Delayed oral transit;Decreased bolus cohesion;Premature spillage Oral - Thin Teaspoon Delayed oral transit;Decreased bolus cohesion;Premature spillage Oral - Thin Cup Delayed oral transit;Decreased bolus cohesion;Premature spillage Oral - Thin Straw Delayed oral transit;Decreased bolus cohesion;Premature spillage Oral - Puree Delayed  oral transit;Decreased bolus cohesion;Premature spillage Oral - Mech Soft Delayed oral transit Oral - Regular Decreased velopharyngeal closure;Nasal reflux;Delayed oral transit Oral - Multi-Consistency -- Oral - Pill -- Oral Phase - Comment --  CHL IP PHARYNGEAL PHASE 09/10/2019 Pharyngeal Phase Impaired Pharyngeal- Pudding Teaspoon --  Pharyngeal -- Pharyngeal- Pudding Cup -- Pharyngeal -- Pharyngeal- Honey Teaspoon -- Pharyngeal -- Pharyngeal- Honey Cup -- Pharyngeal -- Pharyngeal- Nectar Teaspoon Delayed swallow initiation-pyriform sinuses Pharyngeal Material does not enter airway Pharyngeal- Nectar Cup Delayed swallow initiation-pyriform sinuses Pharyngeal Material does not enter airway Pharyngeal- Nectar Straw Delayed swallow initiation-pyriform sinuses;Penetration/Aspiration before swallow;Moderate aspiration Pharyngeal Material enters airway, passes BELOW cords without attempt by patient to eject out (silent aspiration) Pharyngeal- Thin Teaspoon Delayed swallow initiation-vallecula;Pharyngeal residue - valleculae Pharyngeal Material does not enter airway Pharyngeal- Thin Cup Delayed swallow initiation-pyriform sinuses;Reduced anterior laryngeal mobility;Reduced laryngeal elevation;Reduced airway/laryngeal closure;Penetration/Aspiration before swallow Pharyngeal Material enters airway, passes BELOW cords without attempt by patient to eject out (silent aspiration) Pharyngeal- Thin Straw Delayed swallow initiation-pyriform sinuses Pharyngeal Material enters airway, passes BELOW cords without attempt by patient to eject out (silent aspiration) Pharyngeal- Puree Delayed swallow initiation-vallecula;Reduced pharyngeal peristalsis;Reduced epiglottic inversion;Reduced tongue base retraction;Pharyngeal residue - valleculae Pharyngeal -- Pharyngeal- Mechanical Soft Delayed swallow initiation-vallecula Pharyngeal -- Pharyngeal- Regular -- Pharyngeal -- Pharyngeal- Multi-consistency -- Pharyngeal -- Pharyngeal- Pill -- Pharyngeal -- Pharyngeal Comment nectar via cup with head turn left did not prevent aspiration, allowed trace aspiration posterior trach, chin tuck worsened airway protection with gross spillage over epiglottis, During MBS, pt stated "I swallowed" x2 when he had not.  Controlling amount of nectar strictly prevented aspiration but pt at  higher risk due to silent nature of aspiration.  Larger liquid bolus faciliated efficient swallow.    CHL IP CERVICAL ESOPHAGEAL PHASE 09/10/2019 Cervical Esophageal Phase WFL  Upon esophageal sweep, pt appeared clear Pudding Teaspoon -- Pudding Cup -- Honey Teaspoon -- Honey Cup -- Nectar Teaspoon -- Nectar Cup -- Nectar Straw -- Thin Teaspoon -- Thin Cup -- Thin Straw -- Puree -- Mechanical Soft -- Regular -- Multi-consistency -- Pill -- Cervical Esophageal Comment -- Kathleen Lime, MS [facility] SLP Acute Rehab Services Office [phone] [name] 09/10/2019, 10:44 AM              EEG adult  Result Date: 09/11/2019 Lora Havens, MD     09/11/2019 10:23 AM Patient Name: GIONNY HEIDORN MRN: [id number] Epilepsy Attending: Lora Havens Referring Physician/Provider: Dr. Mitzi Hansen Date: 09/10/2019 Duration: 27.08 mins Patient history: 86 year old male who presented with sudden onset of slurred speech left-sided weakness and difficulty walking.  EEG to evaluate for seizures. Level of alertness: Awake, asleep AEDs during EEG study: None Technical aspects: This EEG study was done with scalp electrodes positioned according to the 10-20 International system of electrode placement. Electrical activity was acquired at a sampling rate of 500Hz  and reviewed with a high frequency filter of 70Hz  and a low frequency filter of 1Hz . EEG data were recorded continuously and digitally stored. Description: During awake state, no clear posterior dominant was seen. Sleep was characterized by vertex waves and intermittent generalized 2 to 3 Hz delta slowing.  EEG also showed generalized polymorphic mixed frequencies with predominantly 8-13 Hz alpha-beta activity. Hyperventilation and photic stimulation were not performed. IMPRESSION: This study is within normal limits. No seizures or epileptiform discharges were seen throughout the recording. Lora Havens   ECHOCARDIOGRAM COMPLETE  Result Date: 09/11/2019     ECHOCARDIOGRAM REPORT   Patient Name:  Dalphine Handing Date of Exam: 09/11/2019 Medical Rec #:  [id number]      Height:       69.0 in Accession #:    [id number]     Weight:       150.0 lb Date of Birth:  17-Mar-1926      BSA:          1.828 m Patient Age:    63 years       BP:           159/76 mmHg Patient Gender: M              HR:           68 bpm. Exam Location:  Inpatient Procedure: 2D Echo, Cardiac Doppler and Color Doppler Indications:    Stroke 434.91 / I163.9  History:        Patient has no prior history of Echocardiogram examinations.  Sonographer:    Jonelle Sidle Dance Referring Phys: [id number] [place]  1. Left ventricular ejection fraction, by estimation, is 60 to 65%. The left ventricle has normal function. The left ventricle has no regional wall motion abnormalities. Normal left ventricular annular diastolic velocities are consistent with normal left ventricular relaxation properties and normal left atrial pressure. Diminutive atrial contraction-related velocities are suggestive of recent atrial fibrillation with stunning or left atrial mechanical failure.  2. Right ventricular systolic function is normal. The right ventricular size is normal. Tricuspid regurgitation signal is inadequate for assessing PA pressure.  3. Left atrial size was mildly dilated.  4. The mitral valve is normal in structure. Trivial mitral valve regurgitation.  5. The aortic valve is normal in structure. Aortic valve regurgitation is trivial.  6. Aortic dilatation noted. There is mild dilatation of the ascending aorta measuring 40 mm.  7. The inferior vena cava is normal in size with greater than 50% respiratory variability, suggesting right atrial pressure of 3 mmHg. FINDINGS  Left Ventricle: Left ventricular ejection fraction, by estimation, is 60 to 65%. The left ventricle has normal function. The left ventricle has no regional wall motion abnormalities. The left ventricular internal cavity size was normal in size.  There is  no left ventricular hypertrophy. Normal left ventricular annular diastolic velocities are consistent with normal left ventricular relaxation properties and normal left atrial pressure. Diminutive atrial contraction-related velocities are suggestive of recent atrial fibrillation with stunning or left atrial mechanical failure. Right Ventricle: The right ventricular size is normal. No increase in right ventricular wall thickness. Right ventricular systolic function is normal. Tricuspid regurgitation signal is inadequate for assessing PA pressure. Left Atrium: Left atrial size was mildly dilated. Right Atrium: Right atrial size was normal in size. Pericardium: A small pericardial effusion is present. The pericardial effusion is circumferential. There is no evidence of cardiac tamponade. Mitral Valve: The mitral valve is normal in structure. Trivial mitral valve regurgitation. Tricuspid Valve: The tricuspid valve is normal in structure. Tricuspid valve regurgitation is not demonstrated. Aortic Valve: The aortic valve is normal in structure. Aortic valve regurgitation is trivial. Pulmonic Valve: The pulmonic valve was not well visualized. Pulmonic valve regurgitation is not visualized. Aorta: The aortic root is normal in size and structure and aortic dilatation noted. There is mild dilatation of the ascending aorta measuring 40 mm. Venous: The inferior vena cava is normal in size with greater than 50% respiratory variability, suggesting right atrial pressure of 3 mmHg. IAS/Shunts: No atrial level shunt detected by color flow Doppler. Additional Comments: A pacer  wire is visualized.  LEFT VENTRICLE PLAX 2D LVIDd:         5.49 cm LV PW:         1.22 cm LV IVS:        1.01 cm LVOT diam:     2.10 cm LV SV:         56 LV SV Index:   31 LVOT Area:     3.46 cm  RIGHT VENTRICLE             IVC RV Basal diam:  2.33 cm     IVC diam: 1.54 cm RV S prime:     12.40 cm/s TAPSE (M-mode): 2.1 cm LEFT ATRIUM             Index        RIGHT ATRIUM           Index LA diam:        4.30 cm 2.35 cm/m  RA Area:     14.10 cm LA Vol (A2C):   65.8 ml 35.99 ml/m RA Volume:   31.30 ml  17.12 ml/m LA Vol (A4C):   55.3 ml 30.25 ml/m LA Biplane Vol: 61.8 ml 33.80 ml/m  AORTIC VALVE LVOT Vmax:   78.00 cm/s LVOT Vmean:  50.950 cm/s LVOT VTI:    0.161 m  AORTA Ao Root diam: 4.00 cm Ao Asc diam:  4.00 cm MITRAL VALVE MV Area (PHT): 1.78 cm    SHUNTS MV Decel Time: 426 msec    Systemic VTI:  0.16 m MV E velocity: 62.10 cm/s  Systemic Diam: 2.10 cm Mihai Croitoru MD Electronically signed by Sanda Klein MD Signature Date/Time: 09/11/2019/10:11:23 AM    Final     Assessment/Plan  Dysphagia I discussed in detail with the patient and speech therapy Patient does stay at high risk of aspiration.  But he is losing weight and getting dehydrated as he does not like the thickened liquid. We will change him back to a Mechanical Soft  diet with thin liquids for couple of days All instructions were reviewed with the patient including eating small bites and sitting upright and doing chin tuck method.  We discussed the risk of aspiration pneumonitis. Acute CVA Blood pressure controlled Patient is on statin Working well with therapy Atrial fibrillation Rate is controlled on Toprol INR today was still 5 Coumadin has been on hold When it is less then 3 will start him on Eliquis Repeat INR tomorrow HYPERTENSION, BENIGN Controlled on Toprol Hypothyroidism, unspecified type TSH Normal in 5/21 Hyperlipidemia, unspecified hyperlipidemia type Lipitor LDL 77   Family/ staff Communication:   Labs/tests ordered:  PT/INR  Total time spent in this patient care encounter was  45_  minutes; greater than 50% of the visit spent counseling patient and staff, reviewing records , Labs and coordinating care for problems addressed at this encounter.

## 2019-10-07 NOTE — Progress Notes (Signed)
Cardiology Office Note Date:  10/09/2019  Patient ID:  [name] 29-Feb-1926, MRN [id number] PCP:  [name], MD  EP:  Dr. Caryl Comes   Chief Complaint: annual visit  History of Present Illness: [name] is a 86 y.o. male with history of AFib (s/p PVI ablation in [place]), > Aflutter, HTN, HLD, chronic CHF, DCM suspect 2/2 RVR (systolic) with recovered LVEF, hypothyroidism, PMR  He comes in today to be seen for Dr. Caryl Comes, last seen by him via tele-health visit may 2020, no changes were made.  Traumatic ICH Jan 2021  He was admitted to [facility] 09/09/19 with stroke, his initial INR 2.0, discharged 09/14/19 to SNF neurology recommended change to Eliquis, though OACs were too expensive His medicine team notes during this stay describe his AFib as permanent.  last NH MD note discussed dysphagia, pt losing weght and felt to be dehydrated, did not like thickened liquid diet and changed to trial of mechanical soft. Also notes last INR was 5, and planned to switch to Eliquis once INR <2.0  He is doing pretty well, mentions he is getting "hard core" therapy!  His L arm recovery is lagging behind his L leg improvement by his assessment.  He is a resident at [facility], had just moved there prior to his stroke He denies any cardiac awareness, no CP, palpitations, denies SOB No near syncope or syncope.  He likes the diet change very much.  Encouraged to eat slowly and follow the instructions given to him to avoid choking/aspiration.  He denies any choking/coughing when eating.  Device information MDT dual chamber PPM implanted 11/10/2015   Past Medical History:  Diagnosis Date  . AF (atrial fibrillation) [facility])    a. s/p RFCA/PVI @ [facility] clinic 2007.  . Arthritis    "joints" (06/21/2013)  . Atrial flutter ([place])    a. 06/2013 s/p TEE/DCCV  . Chronic systolic CHF (congestive heart failure) ([place])    a. 06/2013 Echo: EF 20%, diff HK, mild LVH.  Marland Kitchen History of cardiovascular  stress test    Lexiscan Myoview 8/16:  EF 59%, attenuation artifact, no ischemia; Low Risk  . Hyperlipidemia   . Hypertension   . HYPERTENSION, BENIGN 08/21/2009   Qualifier: Diagnosis of  By: Caryl Comes, MD, Remus Blake  Medications(s)  enalapril   . Hypothyroidism   . Neoplasm of uncertain behavior    SKIN  . Polymyalgia rheumatica ([place]) 01/13/2007   Qualifier: History of  By: Danny Lawless CMA, [place]     . Presence of permanent cardiac pacemaker     Past Surgical History:  Procedure Laterality Date  . ATRIAL FIBRILLATION ABLATION  2007  . CARDIOVERSION N/A 06/22/2013   Procedure: CARDIOVERSION;  Surgeon: Sueanne Margarita, MD;  Location: [facility] ENDOSCOPY;  Service: Cardiovascular;  Laterality: N/A;  15:17 Lido 20mg ,IV , Propofol 30 mg, IV synched cardioversion @ 150 joules by Dr. Lajoyce Lauber from a flutter to sinis brady which is baseline for this pt, reports 45-50 reg HR verified by Dr. Ashok Norris  . CARDIOVERSION N/A 11/21/2014   Procedure: CARDIOVERSION;  Surgeon: Fay Records, MD;  Location: [facility] ENDOSCOPY;  Service: Cardiovascular;  Laterality: N/A;  . CARDIOVERSION N/A 07/14/2015   Procedure: CARDIOVERSION;  Surgeon: Sueanne Margarita, MD;  Location: [facility];  Service: Cardiovascular;  Laterality: N/A;  . CATARACT EXTRACTION, BILATERAL Bilateral Summer 2009  . CHOLECYSTECTOMY    . EP IMPLANTABLE DEVICE N/A 11/10/2015   Procedure: Pacemaker Implant;  Surgeon: Remo Lipps  Peterson Lombard, MD;  Location: [place] CV LAB;  Service: Cardiovascular;  Laterality: N/A;  . HAND SURGERY Right    "broke bone; grafted area w/bone from somewhere else"  . HEMORRHOID SURGERY    . INGUINAL HERNIA REPAIR Bilateral   . TEE WITHOUT CARDIOVERSION N/A 06/22/2013   Procedure: TRANSESOPHAGEAL ECHOCARDIOGRAM (TEE);  Surgeon: Sueanne Margarita, MD;  Location: [facility] ENDOSCOPY;  Service: Cardiovascular;  Laterality: N/A;  . TEE WITHOUT CARDIOVERSION N/A 07/14/2015   Procedure: TRANSESOPHAGEAL ECHOCARDIOGRAM (TEE);  Surgeon: Sueanne Margarita, MD;  Location: [facility] ENDOSCOPY;  Service: Cardiovascular;  Laterality: N/A;  . TONSILLECTOMY      Current Outpatient Medications  Medication Sig Dispense Refill  . antiseptic oral rinse (BIOTENE) LIQD 15 mLs by Mouth Rinse route as needed for dry mouth.    Marland Kitchen apixaban (ELIQUIS) 5 MG TABS tablet Take 5 mg by mouth 2 (two) times daily.    Marland Kitchen atorvastatin (LIPITOR) 40 MG tablet Take 40 mg by mouth daily.    . carboxymethylcellulose (REFRESH PLUS) 0.5 % SOLN 1 drop every 4 (four) hours as needed. Each eye    . docusate sodium (COLACE) 100 MG capsule Take 200 mg by mouth daily.    Marland Kitchen glucosamine-chondroitin 500-400 MG tablet Take 1 tablet by mouth 3 (three) times daily.    Marland Kitchen levothyroxine (SYNTHROID) 50 MCG tablet Take 1 tablet (50 mcg total) by mouth daily before breakfast. 90 tablet 1  . magnesium hydroxide (MILK OF MAGNESIA) 400 MG/5ML suspension Take by mouth daily as needed for mild constipation.    . metoprolol succinate (TOPROL-XL) 25 MG 24 hr tablet Take 12.5 mg by mouth daily.    . Multiple Vitamin (MULTIVITAMIN WITH MINERALS) TABS tablet Take 1 tablet by mouth daily. Centrum    . sennosides-docusate sodium (SENOKOT-S) 8.6-50 MG tablet Take 2 tablets by mouth daily.     No current facility-administered medications for this visit.    Allergies:   Patient has no known allergies.   Social History:  The patient  reports that he quit smoking about 56 years ago. His smoking use included cigarettes. He has a 12.00 pack-year smoking history. He has never used smokeless tobacco. He reports current alcohol use. He reports that he does not use drugs.   Family History:  The patient's family history includes Diabetes in his mother; Emphysema in his mother; Kidney disease in his father.  ROS:  Please see the history of present illness.    All other systems are reviewed and otherwise negative.   PHYSICAL EXAM:  VS:  BP 128/66   Pulse 64   Ht 5\' 9"  (1.753 m)   Wt 143 lb (64.9 kg)   SpO2 98%    BMI 21.12 kg/m  BMI: Body mass index is 21.12 kg/m. Well nourished, well developed, in no acute distress  HEENT: normocephalic, atraumatic  Neck: no JVD, carotid bruits or masses Cardiac:  RRR; no significant murmurs, no rubs, or gallops Lungs:  CTA b/l, no wheezing, rhonchi or rales  Abd: soft, nontender MS: no deformity, age appropriate atrophy Ext: no edema  Skin: warm and dry, no rash Neuro: L sided weakness post stroke Psych: euthymic mood, full affect  PPM site is stable, no tethering or discomfort   EKG:  Not done today  PPM interrogation done today and reviewed by myself: Battery and lead measurements are stable RV lead looks like chronically elevated thresholds, outputs are at 3.5V/1.48ms VP <0.1% Physical activity with a sharp decline post stroke AF  burden 0%   09/11/2019: TTE IMPRESSIONS  1. Left ventricular ejection fraction, by estimation, is 60 to 65%. The  left ventricle has normal function. The left ventricle has no regional  wall motion abnormalities. Normal left ventricular annular diastolic  velocities are consistent with normal  left ventricular relaxation properties and normal left atrial pressure.  Diminutive atrial contraction-related velocities are suggestive of recent  atrial fibrillation with stunning or left atrial mechanical failure.  2. Right ventricular systolic function is normal. The right ventricular  size is normal. Tricuspid regurgitation signal is inadequate for assessing  PA pressure.  3. Left atrial size was mildly dilated.  4. The mitral valve is normal in structure. Trivial mitral valve  regurgitation.  5. The aortic valve is normal in structure. Aortic valve regurgitation is  trivial.  6. Aortic dilatation noted. There is mild dilatation of the ascending  aorta measuring 40 mm.  7. The inferior vena cava is normal in size with greater than 50%  respiratory variability, suggesting right atrial pressure of 3 mmHg.     Recent Labs: 09/09/2019: Magnesium 2.0 09/12/2019: TSH 3.939 09/27/2019: ALT 6; BUN 33; Creatinine 1.0; Hemoglobin 13.8; Platelets 186; Potassium 4.1; Sodium 142  09/10/2019: Cholesterol 133; HDL 41; LDL Cholesterol 77; Total CHOL/HDL Ratio 3.2; Triglycerides 73; VLDL 15   Estimated Creatinine Clearance: 44.2 mL/min (by C-G formula based on SCr of 1 mg/dL).   Wt Readings from Last 3 Encounters:  10/09/19 143 lb (64.9 kg)  10/03/19 139 lb 6.4 oz (63.2 kg)  09/21/19 142 lb 8 oz (64.6 kg)     Other studies reviewed: Additional studies/records reviewed today include: summarized above  ASSESSMENT AND PLAN:  1. PPM     Intact function, no programming changes made  2. AFlutter     H/o AFib w/PVI ablation 2007     CHA2DS2Vasc is 5, on eliquis      0% burden  3. HTN     Looks good  Disposition: F/u with remotes Q 48mo, in clinic in 1 year, sooner if needed.  Current medicines are reviewed at length with the patient today.  The patient did not have any concerns regarding medicines.  Venetia Night, PA-C 10/09/2019 10:10 AM     [facility] [address] [place] [address] [phone] (office)  [phone] (fax)

## 2019-10-09 ENCOUNTER — Other Ambulatory Visit: Payer: Self-pay

## 2019-10-09 ENCOUNTER — Ambulatory Visit (INDEPENDENT_AMBULATORY_CARE_PROVIDER_SITE_OTHER): Payer: Medicare Other | Admitting: Physician Assistant

## 2019-10-09 VITALS — BP 128/66 | HR 64 | Ht 69.0 in | Wt 143.0 lb

## 2019-10-09 DIAGNOSIS — I4892 Unspecified atrial flutter: Secondary | ICD-10-CM

## 2019-10-09 DIAGNOSIS — I639 Cerebral infarction, unspecified: Secondary | ICD-10-CM

## 2019-10-09 DIAGNOSIS — I4891 Unspecified atrial fibrillation: Secondary | ICD-10-CM

## 2019-10-09 DIAGNOSIS — I480 Paroxysmal atrial fibrillation: Secondary | ICD-10-CM

## 2019-10-09 DIAGNOSIS — Z950 Presence of cardiac pacemaker: Secondary | ICD-10-CM | POA: Diagnosis not present

## 2019-10-09 DIAGNOSIS — I10 Essential (primary) hypertension: Secondary | ICD-10-CM | POA: Diagnosis not present

## 2019-10-09 NOTE — Patient Instructions (Signed)
Medication Instructions:  Your physician recommends that you continue on your current medications as directed. Please refer to the Current Medication list given to you today.  *If you need a refill on your cardiac medications before your next appointment, please call your pharmacy*   Lab Work: NONE ORDERED  TODAY  If you have labs (blood work) drawn today and your tests are completely normal, you will receive your results only by: . MyChart Message (if you have MyChart) OR . A paper copy in the mail If you have any lab test that is abnormal or we need to change your treatment, we will call you to review the results.   Testing/Procedures: NONE ORDERED  TODAY   Follow-Up: At [facility], you and your health needs are our priority.  As part of our continuing mission to provide you with exceptional heart care, we have created designated Provider Care Teams.  These Care Teams include your primary Cardiologist (physician) and Advanced Practice Providers (APPs -  Physician Assistants and Nurse Practitioners) who all work together to provide you with the care you need, when you need it.  We recommend signing up for the patient portal called "MyChart".  Sign up information is provided on this After Visit Summary.  MyChart is used to connect with patients for Virtual Visits (Telemedicine).  Patients are able to view lab/test results, encounter notes, upcoming appointments, etc.  Non-urgent messages can be sent to your provider as well.   To learn more about what you can do with MyChart, go to [email or web address].    Your next appointment:   1 year(s)  The format for your next appointment:   In Person  Provider:   You may see Dr. Klein  or one of the following Advanced Practice Providers on your designated Care Team:    Amber Seiler, NP  Renee Ursuy, PA-C  Michael "Andy" Tillery, PA-C    Other Instructions   

## 2019-10-12 ENCOUNTER — Encounter: Payer: Self-pay | Admitting: Nurse Practitioner

## 2019-10-12 ENCOUNTER — Non-Acute Institutional Stay (SKILLED_NURSING_FACILITY): Payer: Medicare Other | Admitting: Nurse Practitioner

## 2019-10-12 DIAGNOSIS — I639 Cerebral infarction, unspecified: Secondary | ICD-10-CM

## 2019-10-12 DIAGNOSIS — I480 Paroxysmal atrial fibrillation: Secondary | ICD-10-CM | POA: Diagnosis not present

## 2019-10-12 DIAGNOSIS — R1312 Dysphagia, oropharyngeal phase: Secondary | ICD-10-CM

## 2019-10-12 DIAGNOSIS — E785 Hyperlipidemia, unspecified: Secondary | ICD-10-CM | POA: Diagnosis not present

## 2019-10-12 DIAGNOSIS — I5022 Chronic systolic (congestive) heart failure: Secondary | ICD-10-CM

## 2019-10-12 DIAGNOSIS — I10 Essential (primary) hypertension: Secondary | ICD-10-CM

## 2019-10-12 DIAGNOSIS — K5901 Slow transit constipation: Secondary | ICD-10-CM

## 2019-10-12 DIAGNOSIS — I495 Sick sinus syndrome: Secondary | ICD-10-CM

## 2019-10-12 DIAGNOSIS — E039 Hypothyroidism, unspecified: Secondary | ICD-10-CM | POA: Diagnosis not present

## 2019-10-12 NOTE — Assessment & Plan Note (Signed)
Tolerated thin liquids so far, the patient desires fresh fruits, dietary will manage.

## 2019-10-12 NOTE — Assessment & Plan Note (Signed)
S/p pacemaker

## 2019-10-12 NOTE — Assessment & Plan Note (Signed)
Stable, 09/12/19 TSH 3.235, continue Levothyroxine.

## 2019-10-12 NOTE — Assessment & Plan Note (Signed)
Stable, needs more fresh fruits, continue Senokot S, MiraLax, Colace.

## 2019-10-12 NOTE — Assessment & Plan Note (Signed)
Compensated, off diuretics.

## 2019-10-12 NOTE — Assessment & Plan Note (Signed)
LDL 77 09/10/19, continue Atorvastatin

## 2019-10-12 NOTE — Assessment & Plan Note (Signed)
Blood pressure is controlled

## 2019-10-12 NOTE — Assessment & Plan Note (Signed)
Improving, stronger left limbs, muscle strength 5/5, mild left facial weakness, continue therapy.

## 2019-10-12 NOTE — Progress Notes (Signed)
Location:   SNF [place] Room Number: 94 Place of Service:  SNF (31) Provider:  Marlana Latus, NP  [name], MD  Patient Care Team: [name], MD as PCP - General (Internal Medicine) [name], MD (Ophthalmology) [name], MD (Orthopedic Surgery) [name], MD (Cardiology) [name], MD (General Surgery) [name], MD (Family Medicine)  Extended Emergency Contact Information Primary Emergency Contact: Mccowen,[name] Address: [place], [place] [address] [name] of [place] Phone: [phone] Mobile Phone: [phone] Relation: Spouse Secondary Emergency Contact: Edel, Rivero Home Phone: [phone] Relation: Spouse  Code Status:  Full Code Goals of care: Advanced Directive information Advanced Directives 10/12/2019  Does Patient Have a Medical Advance Directive? Yes  Type of Paramedic of [place];Living will  Does patient want to make changes to medical advance directive? No - Patient declined  Copy of [place] in Chart? Yes - validated most recent copy scanned in chart (See row information)  Would patient like information on creating a medical advance directive? -     Chief Complaint  Patient presents with  . Medical Management of Chronic Issues    Routine visit    HPI:  Pt is a 86 y.o. male seen today for medical management of chronic diseases.       Hx of CVA left sided weakness is improving, muscle strength is almost 5/5, thin liquid now. AFib, heart rate is in control, on Metoprolol 12.5mg  qd, on Eliquis 5mg  bid, s/p  pacemaker, hyperlipidemia, on Atorvastatin, LDL 77 09/10/19,  hypothyroidism, on Levothyroxine 32mcg qd, TSH 3.235. Hx of CHF, off Furosemide. HTN, blood pressure is controlled. Constipation is managed on Colace, Senokot S, MiraLax.   Past Medical History:  Diagnosis Date  . AF (atrial fibrillation) [facility])    a. s/p  RFCA/PVI @ [facility] clinic 2007.  . Arthritis    "joints" (06/21/2013)  . Atrial flutter ([place])    a. 06/2013 s/p TEE/DCCV  . Chronic systolic CHF (congestive heart failure) ([place])    a. 06/2013 Echo: EF 20%, diff HK, mild LVH.  Marland Kitchen History of cardiovascular stress test    Lexiscan Myoview 8/16:  EF 59%, attenuation artifact, no ischemia; Low Risk  . Hyperlipidemia   . Hypertension   . HYPERTENSION, BENIGN 08/21/2009   Qualifier: Diagnosis of  By: Caryl Comes, MD, Remus Blake  Medications(s)  enalapril   . Hypothyroidism   . Neoplasm of uncertain behavior    SKIN  . Polymyalgia rheumatica ([place]) 01/13/2007   Qualifier: History of  By: Danny Lawless CMA, [place]     . Presence of permanent cardiac pacemaker    Past Surgical History:  Procedure Laterality Date  . ATRIAL FIBRILLATION ABLATION  2007  . CARDIOVERSION N/A 06/22/2013   Procedure: CARDIOVERSION;  Surgeon: Sueanne Margarita, MD;  [facility];  Service: Cardiovascular;  Laterality: N/A;  15:17 Lido 20mg ,IV , Propofol 30 mg, IV synched cardioversion @ 150 joules by Dr. Lajoyce Lauber from a flutter to sinis brady which is baseline for this pt, reports 45-50 reg HR verified by Dr. Ashok Norris  . CARDIOVERSION N/A 11/21/2014   Procedure: CARDIOVERSION;  Surgeon: Fay Records, MD;  Location: [place];  Service: Cardiovascular;  Laterality: N/A;  . CARDIOVERSION N/A 07/14/2015   Procedure: CARDIOVERSION;  Surgeon: Sueanne Margarita, MD;  [facility];  Service: Cardiovascular;  Laterality: N/A;  . CATARACT EXTRACTION,  BILATERAL Bilateral Summer 2009  . CHOLECYSTECTOMY    . EP IMPLANTABLE DEVICE N/A 11/10/2015   Procedure: Pacemaker Implant;  Surgeon: [name], MD;  Location: [facility] CV LAB;  Service: Cardiovascular;  Laterality: N/A;  . HAND SURGERY Right    "broke bone; grafted area w/bone from somewhere else"  . HEMORRHOID SURGERY    . INGUINAL HERNIA REPAIR Bilateral   . TEE WITHOUT CARDIOVERSION N/A 06/22/2013    Procedure: TRANSESOPHAGEAL ECHOCARDIOGRAM (TEE);  Surgeon: Sueanne Margarita, MD;  Location: [facility] ENDOSCOPY;  Service: Cardiovascular;  Laterality: N/A;  . TEE WITHOUT CARDIOVERSION N/A 07/14/2015   Procedure: TRANSESOPHAGEAL ECHOCARDIOGRAM (TEE);  Surgeon: Sueanne Margarita, MD;  Location: [facility] ENDOSCOPY;  Service: Cardiovascular;  Laterality: N/A;  . TONSILLECTOMY      No Known Allergies  Allergies as of 10/12/2019   No Known Allergies     Medication List       Accurate as of October 12, 2019  3:45 PM. If you have any questions, ask your nurse or doctor.        STOP taking these medications   magnesium hydroxide 400 MG/5ML suspension Commonly known as: MILK OF MAGNESIA Stopped by: Johnita Palleschi X Shahram Alexopoulos, NP     TAKE these medications   antiseptic oral rinse Liqd 20 mLs by Mouth Rinse route as needed for dry mouth.   atorvastatin 40 MG tablet Commonly known as: LIPITOR Take 40 mg by mouth daily.   carboxymethylcellulose 0.5 % Soln Commonly known as: REFRESH PLUS 1 drop every 4 (four) hours as needed. Each eye   docusate sodium 100 MG capsule Commonly known as: COLACE Take 200 mg by mouth daily.   Eliquis 5 MG Tabs tablet Generic drug: apixaban Take 5 mg by mouth 2 (two) times daily.   glucosamine-chondroitin 500-400 MG tablet Take 1 tablet by mouth 3 (three) times daily.   levothyroxine 50 MCG tablet Commonly known as: SYNTHROID Take 1 tablet (50 mcg total) by mouth daily before breakfast.   metoprolol succinate 25 MG 24 hr tablet Commonly known as: TOPROL-XL Take 12.5 mg by mouth daily.   multivitamin with minerals Tabs tablet Take 1 tablet by mouth daily. Centrum   polyethylene glycol 17 g packet Commonly known as: MIRALAX / GLYCOLAX Take 17 g by mouth daily. 17 gram/dose, oral, Once A Morning, Miralax 17 gm PO QD. Hold for diarrhea.   sennosides-docusate sodium 8.6-50 MG tablet Commonly known as: SENOKOT-S Take 2 tablets by mouth daily.       Review of Systems    Constitutional: Negative for appetite change, fatigue and fever.  HENT: Positive for hearing loss. Negative for congestion and voice change.   Eyes: Negative for visual disturbance.  Respiratory: Negative for cough, shortness of breath and wheezing.   Cardiovascular: Negative for leg swelling.  Gastrointestinal: Negative for abdominal pain and constipation.  Genitourinary: Positive for frequency. Negative for difficulty urinating, dysuria and urgency.  Musculoskeletal: Positive for gait problem.  Skin: Negative for color change.  Neurological: Positive for facial asymmetry and weakness. Negative for speech difficulty and light-headedness.  Psychiatric/Behavioral: Positive for sleep disturbance. Negative for behavioral problems. The patient is not nervous/anxious.     Immunization History  Administered Date(s) Administered  . Influenza Split 01/25/2012  . Influenza Whole 02/08/2008, 03/12/2009, 01/16/2010  . Influenza, High Dose Seasonal PF 01/24/2015, 03/04/2017, 02/16/2018, 12/29/2018  . Influenza,inj,Quad PF,6+ Mos 01/25/2013, 01/23/2014  . Influenza-Unspecified 01/02/2016  . PFIZER SARS-COV-2 Vaccination 05/22/2019, 06/12/2019  . Pneumococcal Conjugate-13 01/25/2013  .  Pneumococcal Polysaccharide-23 05/28/2016  . Td 01/16/2010  . Tdap 05/06/2019   Pertinent  Health Maintenance Due  Topic Date Due  . INFLUENZA VACCINE  12/02/2019  . PNA vac Low Risk Adult  Completed   Fall Risk  03/23/2019 03/17/2018 02/16/2018 05/28/2016 04/08/2016  Falls in the past year? 0 1 No No No  Comment [facility]i Telephone Survey: data to providers prior to load [employer] Telephone Survey: data to providers prior to load - - Emmi Telephone Survey: data to providers prior to load  Number falls in past yr: - 1 - - -  Comment - Emmi Telephone Survey Actual Response = 2 - - -  Injury with Fall? - 1 - - -   Functional Status Survey:    Vitals:   10/12/19 1116  BP: (!) 144/77  Pulse: 63  Resp: 18  Temp: (!)  97.1 F (36.2 C)  SpO2: 96%  Weight: 144 lb 11.2 oz (65.6 kg)  Height: 5\' 9"  (1.753 m)   Body mass index is 21.37 kg/m. Physical Exam Vitals and nursing note reviewed.  Constitutional:      Appearance: Normal appearance.  HENT:     Head: Normocephalic and atraumatic.     Mouth/Throat:     Mouth: Mucous membranes are moist.  Eyes:     Extraocular Movements: Extraocular movements intact.     Conjunctiva/sclera: Conjunctivae normal.     Pupils: Pupils are equal, round, and reactive to light.  Cardiovascular:     Rate and Rhythm: Normal rate and regular rhythm.     Heart sounds: No murmur heard.      Comments: Left chest pace maker Pulmonary:     Effort: Pulmonary effort is normal.     Breath sounds: No rales.  Abdominal:     General: Bowel sounds are normal.     Palpations: Abdomen is soft.     Tenderness: There is no abdominal tenderness.  Musculoskeletal:     Cervical back: Normal range of motion and neck supple.     Right lower leg: No edema.     Left lower leg: No edema.  Skin:    General: Skin is warm and dry.  Neurological:     General: No focal deficit present.     Mental Status: He is alert and oriented to person, place, and time. Mental status is at baseline.     Cranial Nerves: Cranial nerve deficit present.     Motor: Weakness present.     Coordination: Coordination abnormal.     Gait: Gait abnormal.     Comments: LUE, LLE weakness with muscle strength 5/5  Psychiatric:        Mood and Affect: Mood normal.        Behavior: Behavior normal.        Thought Content: Thought content normal.        Judgment: Judgment normal.     Labs reviewed: Recent Labs    09/09/19 1138 09/10/19 0621 09/12/19 0237 09/12/19 0237 09/13/19 0454 09/14/19 0301 09/27/19 0000  NA 141   < > 140   < > 140 141 142  K 4.5   < > 4.2   < > 3.9 4.2 4.1  CL 104   < > 103   < > 106 107 108  CO2 28   < > 26   < > 26 25 26*  GLUCOSE 104*   < > 104*  --  109* 118*  --   BUN 16    < >  28*   < > 33* 33* 33*  CREATININE 0.98   < > 1.17   < > 1.26* 1.14 1.0  CALCIUM 9.3   < > 8.9   < > 8.8* 8.9 9.2  MG 2.0  --   --   --   --   --   --    < > = values in this interval not displayed.   Recent Labs    05/07/19 0512 09/09/19 1138 09/27/19 0000  AST 31 21 17   ALT 8 10 6*  ALKPHOS 59 72 84  BILITOT 1.3* 1.0  --   PROT 6.0* 6.6  --   ALBUMIN 3.4* 3.8 3.9   Recent Labs    05/06/19 1320 05/07/19 0512 09/09/19 1138 09/10/19 0621 09/12/19 0237 09/12/19 0237 09/13/19 0454 09/14/19 0301 09/27/19 0000  WBC 7.8   < > 4.2   < > 6.2   < > 5.6 5.4 6.2  NEUTROABS 6.5  --  3.0  --   --   --   --   --  4,700  HGB 13.6   < > 13.4   < > 13.1   < > 12.6* 12.9* 13.8  HCT 42.6   < > 42.2   < > 40.5   < > 38.4* 39.3 41  MCV 96.4   < > 95.3   < > 93.3  --  93.9 94.0  --   PLT 155   < > 134*   < > 124*   < > 128* 133* 186   < > = values in this interval not displayed.   Lab Results  Component Value Date   TSH 3.939 09/12/2019   Lab Results  Component Value Date   HGBA1C 5.5 09/10/2019   Lab Results  Component Value Date   CHOL 133 09/10/2019   HDL 41 09/10/2019   LDLCALC 77 09/10/2019   TRIG 73 09/10/2019   CHOLHDL 3.2 09/10/2019    Significant Diagnostic Results in last 30 days:  No results found.  Assessment/Plan Acute CVA (cerebrovascular accident) ([place]) Improving, stronger left limbs, muscle strength 5/5, mild left facial weakness, continue therapy.   AF (paroxysmal atrial fibrillation) (HCC) Heart rate is in control, continue Metoprolol, continue Eliquis.   Essential hypertension Blood pressure is controlled.   SSS (sick sinus syndrome) ([place]) S/p pacemaker.   Slow transit constipation Stable, needs more fresh fruits, continue Senokot S, MiraLax, Colace.   Hypothyroidism Stable, 09/12/19 TSH 3.235, continue Levothyroxine.   Dysphagia Tolerated thin liquids so far, the patient desires fresh fruits, dietary will manage.   Hyperlipidemia LDL 77  09/10/19, continue Atorvastatin  Chronic systolic CHF (congestive heart failure) (HCC) Compensated, off diuretics.     Family/ staff Communication: plan of care reviewed with the patient and charge nurse.   Labs/tests ordered:  none  Time spend 25 minutes.

## 2019-10-12 NOTE — Assessment & Plan Note (Signed)
Heart rate is in control, continue Metoprolol, continue Eliquis.

## 2019-10-31 ENCOUNTER — Inpatient Hospital Stay: Payer: Medicare Other | Admitting: Neurology

## 2019-10-31 ENCOUNTER — Other Ambulatory Visit: Payer: Self-pay

## 2019-11-01 ENCOUNTER — Non-Acute Institutional Stay (SKILLED_NURSING_FACILITY): Payer: Medicare Other | Admitting: Nurse Practitioner

## 2019-11-01 ENCOUNTER — Encounter: Payer: Self-pay | Admitting: Nurse Practitioner

## 2019-11-01 DIAGNOSIS — I4891 Unspecified atrial fibrillation: Secondary | ICD-10-CM

## 2019-11-01 DIAGNOSIS — E785 Hyperlipidemia, unspecified: Secondary | ICD-10-CM

## 2019-11-01 DIAGNOSIS — K5901 Slow transit constipation: Secondary | ICD-10-CM

## 2019-11-01 DIAGNOSIS — W19XXXA Unspecified fall, initial encounter: Secondary | ICD-10-CM | POA: Diagnosis not present

## 2019-11-01 DIAGNOSIS — E039 Hypothyroidism, unspecified: Secondary | ICD-10-CM

## 2019-11-01 DIAGNOSIS — R531 Weakness: Secondary | ICD-10-CM

## 2019-11-01 DIAGNOSIS — I10 Essential (primary) hypertension: Secondary | ICD-10-CM

## 2019-11-01 DIAGNOSIS — I5022 Chronic systolic (congestive) heart failure: Secondary | ICD-10-CM

## 2019-11-01 NOTE — Assessment & Plan Note (Signed)
Compensated, off Furosemide.

## 2019-11-01 NOTE — Assessment & Plan Note (Signed)
TSH wnl 09/12/19, continue Levothyroxine 79mcg qd.

## 2019-11-01 NOTE — Assessment & Plan Note (Signed)
Stable, continue Colace, Senokot S II daily, MiraLax qd.

## 2019-11-01 NOTE — Assessment & Plan Note (Signed)
Hx of CVA left sided weakness is improving, muscle strength is almost 5/5, thin liquid now.

## 2019-11-01 NOTE — Assessment & Plan Note (Signed)
LDL 77 09/10/19, continue Atorvastatin

## 2019-11-01 NOTE — Assessment & Plan Note (Signed)
Blood pressure is controlled

## 2019-11-01 NOTE — Assessment & Plan Note (Signed)
heart rate is in control, continue Metoprolol 12.5mg  qd, on Eliquis 5mg  bid, s/p pacemaker

## 2019-11-01 NOTE — Assessment & Plan Note (Signed)
Due to left sided weakness, no apparent injury, close supervision/assitance needed for safety.

## 2019-11-01 NOTE — Progress Notes (Signed)
Location:    [place] Room Number: 39 Place of Service:  SNF [phone]) Provider:  Marda Stalker, Lennie Odor NP   [name], MD  Patient Care Team: [name], MD as PCP - General (Internal Medicine) [name], MD (Ophthalmology) [name], MD (Orthopedic Surgery) [name], MD (Cardiology) [name], MD (General Surgery) [name], MD (Family Medicine)  Extended Emergency Contact Information Primary Emergency Contact: Dumler,[name] Address: [place], [place] [address] [name] of [place] Phone: [phone] Mobile Phone: [phone] Relation: Spouse Secondary Emergency Contact: Rodricus, Candelaria Home Phone: [phone] Relation: Spouse  Code Status:  Full Code Goals of care: Advanced Directive information Advanced Directives 10/12/2019  Does Patient Have a Medical Advance Directive? Yes  Type of Paramedic of [place];Living will  Does patient want to make changes to medical advance directive? No - Patient declined  Copy of [place] in Chart? Yes - validated most recent copy scanned in chart (See row information)  Would patient like information on creating a medical advance directive? -     Chief Complaint  Patient presents with   Acute Visit    Fall    HPI:  Pt is a 86 y.o. male seen today for an acute visit for assisted fall, no apparent injury. S/p CVA with left sided weakness, in SNF [facility] for therapy.     Hx of CVA left sided weakness is improving, muscle strength is almost 5/5, thin liquid now.   AFib, heart rate is in control, on Metoprolol 12.5mg  qd, on Eliquis 5mg  bid, s/p pacemaker  Hyperlipidemia, on Atorvastatin, LDL 77 09/10/19  Hypothyroidism, on Levothyroxine 63mcg qd, TSH 3.235.   Hx of CHF, off Furosemide. HTN, blood pressure is controlled.   Constipation is managed on Colace, Senokot S II daily, MiraLax qd.   Past Medical  History:  Diagnosis Date   AF (atrial fibrillation) ([place])    a. s/p RFCA/PVI @ [facility] clinic 2007.   Arthritis    "joints" (06/21/2013)   Atrial flutter ([place])    a. 06/2013 s/p TEE/DCCV   Chronic systolic CHF (congestive heart failure) ([place])    a. 06/2013 Echo: EF 20%, diff HK, mild LVH.   History of cardiovascular stress test    Lexiscan Myoview 8/16:  EF 59%, attenuation artifact, no ischemia; Low Risk   Hyperlipidemia    Hypertension    HYPERTENSION, BENIGN 08/21/2009   Qualifier: Diagnosis of  By: Caryl Comes, MD, Remus Blake  Medications(s)  enalapril    Hypothyroidism    Neoplasm of uncertain behavior    SKIN   Polymyalgia rheumatica ([place]) 01/13/2007   Qualifier: History of  By: Danny Lawless CMA, [place]      Presence of permanent cardiac pacemaker    Past Surgical History:  Procedure Laterality Date   ATRIAL FIBRILLATION ABLATION  2007   CARDIOVERSION N/A 06/22/2013   Procedure: CARDIOVERSION;  Surgeon: Sueanne Margarita, MD;  [facility];  Service: Cardiovascular;  Laterality: N/A;  15:17 Lido 20mg ,IV , Propofol 30 mg, IV synched cardioversion @ 150 joules by Dr. Lajoyce Lauber from a flutter to sinis brady which is baseline for this pt, reports 45-50 reg HR verified by Dr. Ashok Norris   CARDIOVERSION N/A 11/21/2014   Procedure: CARDIOVERSION;  Surgeon: Fay Records, MD;  Location: [place];  Service: Cardiovascular;  Laterality: N/A;   CARDIOVERSION N/A 07/14/2015   Procedure:  CARDIOVERSION;  Surgeon: Sueanne Margarita, MD;  Location: [place] [facility];  Service: Cardiovascular;  Laterality: N/A;   CATARACT EXTRACTION, BILATERAL Bilateral Summer 2009   CHOLECYSTECTOMY     EP IMPLANTABLE DEVICE N/A 11/10/2015   Procedure: Pacemaker Implant;  Surgeon: [name], MD;  Location: [place] CV LAB;  Service: Cardiovascular;  Laterality: N/A;   HAND SURGERY Right    "broke bone; grafted area w/bone from somewhere else"   [place] Bilateral    TEE WITHOUT CARDIOVERSION N/A 06/22/2013   Procedure: TRANSESOPHAGEAL ECHOCARDIOGRAM (TEE);  Surgeon: Sueanne Margarita, MD;  Location: [facility] ENDOSCOPY;  Service: Cardiovascular;  Laterality: N/A;   TEE WITHOUT CARDIOVERSION N/A 07/14/2015   Procedure: TRANSESOPHAGEAL ECHOCARDIOGRAM (TEE);  Surgeon: Sueanne Margarita, MD;  Location: [name] ENDOSCOPY;  Service: Cardiovascular;  Laterality: N/A;   TONSILLECTOMY      No Known Allergies  Allergies as of 11/01/2019   No Known Allergies     Medication List       Accurate as of November 01, 2019 11:59 PM. If you have any questions, ask your nurse or doctor.        antiseptic oral rinse Liqd 20 mLs by Mouth Rinse route as needed for dry mouth.   atorvastatin 40 MG tablet Commonly known as: LIPITOR Take 40 mg by mouth daily.   carboxymethylcellulose 0.5 % Soln Commonly known as: REFRESH PLUS 1 drop every 4 (four) hours as needed. Each eye   docusate sodium 100 MG capsule Commonly known as: COLACE Take 200 mg by mouth daily.   Eliquis 5 MG Tabs tablet Generic drug: apixaban Take 5 mg by mouth 2 (two) times daily.   glucosamine-chondroitin 500-400 MG tablet Take 1 tablet by mouth 3 (three) times daily.   levothyroxine 50 MCG tablet Commonly known as: SYNTHROID Take 1 tablet (50 mcg total) by mouth daily before breakfast.   metoprolol succinate 25 MG 24 hr tablet Commonly known as: TOPROL-XL Take 12.5 mg by mouth daily.   multivitamin with minerals Tabs tablet Take 1 tablet by mouth daily. Centrum   polyethylene glycol 17 g packet Commonly known as: MIRALAX / GLYCOLAX Take 17 g by mouth daily. 17 gram/dose, oral, Once A Morning, Miralax 17 gm PO QD. Hold for diarrhea.   sennosides-docusate sodium 8.6-50 MG tablet Commonly known as: SENOKOT-S Take 2 tablets by mouth daily.       Review of Systems  Constitutional: Negative for appetite change, fatigue and fever.  HENT: Positive for hearing loss. Negative for  congestion and voice change.   Eyes: Negative for visual disturbance.  Respiratory: Negative for cough and shortness of breath.   Cardiovascular: Negative for leg swelling.  Gastrointestinal: Negative for abdominal pain and constipation.  Genitourinary: Positive for frequency. Negative for dysuria and urgency.  Musculoskeletal: Positive for gait problem.  Skin: Negative for color change.  Neurological: Positive for facial asymmetry and weakness. Negative for dizziness, speech difficulty and headaches.  Psychiatric/Behavioral: Positive for sleep disturbance. Negative for behavioral problems. The patient is not nervous/anxious.     Immunization History  Administered Date(s) Administered   Influenza Split 01/25/2012   Influenza Whole 02/08/2008, 03/12/2009, 01/16/2010   Influenza, High Dose Seasonal PF 01/24/2015, 03/04/2017, 02/16/2018, 12/29/2018   Influenza,inj,Quad PF,6+ Mos 01/25/2013, 01/23/2014   Influenza-Unspecified 01/02/2016   PFIZER SARS-COV-2 Vaccination 05/22/2019, 06/12/2019   Pneumococcal Conjugate-13 01/25/2013   Pneumococcal Polysaccharide-23 05/28/2016   Td 01/16/2010   Tdap 05/06/2019   Pertinent  Health Maintenance Due  Topic Date Due   INFLUENZA VACCINE  12/02/2019   PNA vac Low Risk Adult  Completed   Fall Risk  03/23/2019 03/17/2018 02/16/2018 05/28/2016 04/08/2016  Falls in the past year? 0 1 No No No  Comment [facility] Telephone Survey: data to providers prior to load [employer] Telephone Survey: data to providers prior to load - - [facility] Telephone Survey: data to providers prior to load  Number falls in past yr: - 1 - - -  Comment - Emmi Telephone Survey Actual Response = 2 - - -  Injury with Fall? - 1 - - -   Functional Status Survey:    Vitals:   11/01/19 1501  BP: 111/68  Pulse: 72  Resp: 18  Temp: (!) 97.2 F (36.2 C)  SpO2: 97%  Weight: 142 lb 1.6 oz (64.5 kg)  Height: 5\' 9"  (1.753 m)   Body mass index is 20.98 kg/m. Physical Exam Vitals  and nursing note reviewed.  Constitutional:      Appearance: Normal appearance.  HENT:     Head: Normocephalic and atraumatic.  Eyes:     Extraocular Movements: Extraocular movements intact.     Conjunctiva/sclera: Conjunctivae normal.     Pupils: Pupils are equal, round, and reactive to light.  Cardiovascular:     Rate and Rhythm: Normal rate and regular rhythm.     Heart sounds: No murmur heard.      Comments: Left chest pace maker Pulmonary:     Effort: Pulmonary effort is normal.     Breath sounds: No rales.  Abdominal:     General: Bowel sounds are normal.     Palpations: Abdomen is soft.     Tenderness: There is no abdominal tenderness.  Musculoskeletal:     Cervical back: Normal range of motion and neck supple.     Right lower leg: No edema.     Left lower leg: No edema.  Neurological:     General: No focal deficit present.     Mental Status: He is alert and oriented to person, place, and time. Mental status is at baseline.     Cranial Nerves: Cranial nerve deficit present.     Motor: Weakness present.     Coordination: Coordination abnormal.     Gait: Gait abnormal.     Comments: LUE, LLE weakness with muscle strength 5/5  Psychiatric:        Mood and Affect: Mood normal.        Behavior: Behavior normal.        Thought Content: Thought content normal.        Judgment: Judgment normal.     Labs reviewed: Recent Labs    09/09/19 1138 09/10/19 0621 09/12/19 0237 09/12/19 0237 09/13/19 0454 09/14/19 0301 09/27/19 0000  NA 141   < > 140   < > 140 141 142  K 4.5   < > 4.2   < > 3.9 4.2 4.1  CL 104   < > 103   < > 106 107 108  CO2 28   < > 26   < > 26 25 26*  GLUCOSE 104*   < > 104*  --  109* 118*  --   BUN 16   < > 28*   < > 33* 33* 33*  CREATININE 0.98   < > 1.17   < > 1.26* 1.14 1.0  CALCIUM 9.3   < > 8.9   < > 8.8* 8.9 9.2  MG 2.0  --   --   --   --   --   --    < > = values in this interval not displayed.   Recent Labs    05/07/19 0512  09/09/19 1138 09/27/19 0000  AST 31 21 17   ALT 8 10 6*  ALKPHOS 59 72 84  BILITOT 1.3* 1.0  --   PROT 6.0* 6.6  --   ALBUMIN 3.4* 3.8 3.9   Recent Labs    05/06/19 1320 05/07/19 0512 09/09/19 1138 09/10/19 0621 09/12/19 0237 09/12/19 0237 09/13/19 0454 09/14/19 0301 09/27/19 0000  WBC 7.8   < > 4.2   < > 6.2   < > 5.6 5.4 6.2  NEUTROABS 6.5  --  3.0  --   --   --   --   --  4,700  HGB 13.6   < > 13.4   < > 13.1   < > 12.6* 12.9* 13.8  HCT 42.6   < > 42.2   < > 40.5   < > 38.4* 39.3 41  MCV 96.4   < > 95.3   < > 93.3  --  93.9 94.0  --   PLT 155   < > 134*   < > 124*   < > 128* 133* 186   < > = values in this interval not displayed.   Lab Results  Component Value Date   TSH 3.939 09/12/2019   Lab Results  Component Value Date   HGBA1C 5.5 09/10/2019   Lab Results  Component Value Date   CHOL 133 09/10/2019   HDL 41 09/10/2019   LDLCALC 77 09/10/2019   TRIG 73 09/10/2019   CHOLHDL 3.2 09/10/2019    Significant Diagnostic Results in last 30 days:  No results found.  Assessment/Plan Fall Due to left sided weakness, no apparent injury, close supervision/assitance needed for safety.   Acute left-sided weakness Hx of CVA left sided weakness is improving, muscle strength is almost 5/5, thin liquid now.    Hyperlipidemia LDL 77 09/10/19, continue Atorvastatin  Atrial fibrillation (HCC)  heart rate is in control, continue Metoprolol 12.5mg  qd, on Eliquis 5mg  bid, s/p pacemaker   Slow transit constipation Stable, continue Colace, Senokot S II daily, MiraLax qd.   Hypothyroidism TSH wnl 09/12/19, continue Levothyroxine 86mcg qd.   Chronic systolic CHF (congestive heart failure) (HCC) Compensated, off Furosemide.   Essential hypertension Blood pressure is controlled.      Family/ staff Communication: plan of care reviewed with the patient and charge nurse.   Labs/tests ordered:  none  Time spend 25 minutes.

## 2019-11-06 ENCOUNTER — Non-Acute Institutional Stay (SKILLED_NURSING_FACILITY): Payer: Medicare Other | Admitting: Nurse Practitioner

## 2019-11-06 ENCOUNTER — Encounter: Payer: Self-pay | Admitting: Nurse Practitioner

## 2019-11-06 DIAGNOSIS — R079 Chest pain, unspecified: Secondary | ICD-10-CM | POA: Diagnosis not present

## 2019-11-06 DIAGNOSIS — J90 Pleural effusion, not elsewhere classified: Secondary | ICD-10-CM | POA: Diagnosis not present

## 2019-11-06 DIAGNOSIS — E039 Hypothyroidism, unspecified: Secondary | ICD-10-CM

## 2019-11-06 DIAGNOSIS — I63112 Cerebral infarction due to embolism of left vertebral artery: Secondary | ICD-10-CM

## 2019-11-06 DIAGNOSIS — N400 Enlarged prostate without lower urinary tract symptoms: Secondary | ICD-10-CM | POA: Diagnosis not present

## 2019-11-06 DIAGNOSIS — I5022 Chronic systolic (congestive) heart failure: Secondary | ICD-10-CM | POA: Diagnosis not present

## 2019-11-06 DIAGNOSIS — R918 Other nonspecific abnormal finding of lung field: Secondary | ICD-10-CM | POA: Diagnosis not present

## 2019-11-06 DIAGNOSIS — E785 Hyperlipidemia, unspecified: Secondary | ICD-10-CM

## 2019-11-06 DIAGNOSIS — I480 Paroxysmal atrial fibrillation: Secondary | ICD-10-CM

## 2019-11-06 DIAGNOSIS — Z79899 Other long term (current) drug therapy: Secondary | ICD-10-CM | POA: Diagnosis not present

## 2019-11-06 DIAGNOSIS — Z7901 Long term (current) use of anticoagulants: Secondary | ICD-10-CM | POA: Diagnosis not present

## 2019-11-06 DIAGNOSIS — K5901 Slow transit constipation: Secondary | ICD-10-CM

## 2019-11-06 DIAGNOSIS — N281 Cyst of kidney, acquired: Secondary | ICD-10-CM | POA: Diagnosis not present

## 2019-11-06 DIAGNOSIS — I10 Essential (primary) hypertension: Secondary | ICD-10-CM | POA: Diagnosis not present

## 2019-11-06 DIAGNOSIS — R1011 Right upper quadrant pain: Secondary | ICD-10-CM | POA: Diagnosis not present

## 2019-11-06 DIAGNOSIS — Z8673 Personal history of transient ischemic attack (TIA), and cerebral infarction without residual deficits: Secondary | ICD-10-CM | POA: Diagnosis not present

## 2019-11-06 NOTE — Assessment & Plan Note (Signed)
Pale, weakness, chest pain/pressure, not responding to x1 NGT SL, ED to evaluate further.

## 2019-11-06 NOTE — Assessment & Plan Note (Signed)
Left sided weakness, in SNF [facility] for therapy.

## 2019-11-06 NOTE — Progress Notes (Signed)
Location:    [place] Room Number: 39 Place of Service:  SNF (31) Provider: Lennie Odor Jeyda Siebel NP  [name], MD  Patient Care Team: [name], MD as PCP - General (Internal Medicine) [name], MD (Ophthalmology) [name], MD (Orthopedic Surgery) [name], MD (Cardiology) [name], MD (General Surgery) [name], MD [facility] Medicine)  Extended Emergency Contact Information Primary Emergency Contact: Mccoy,Gabriel Address: [place], [place] [address] [name] of [place] Phone: [phone] Mobile Phone: [phone] Relation: Spouse Secondary Emergency Contact: Mccoy, Gabriel Home Phone: [phone] Relation: Spouse  Code Status:  DNR Goals of care: Advanced Directive information Advanced Directives 10/12/2019  Does Patient Have a Medical Advance Directive? Yes  Type of Paramedic of [place];Living will  Does patient want to make changes to medical advance directive? No - Patient declined  Copy of [place] in Chart? Yes - validated most recent copy scanned in chart (See row information)  Would patient like information on creating a medical advance directive? -     Chief Complaint  Patient presents with  . Acute Visit    Chest pain    HPI:  Pt is a 86 y.o. male seen today for an acute visit for sudden onset chest pain, generalized weakness, pale, x1 NGT 0.4mg  SL with no relief, described his chest pain is more like pressure on his chest, occasional cough, no O2 desaturation. He is afebrile.   Hx ofCVA left sided weakness is improving, muscle strength is almost 5/5, thin liquid now.             AFib, heart rate is in control, on Metoprolol 12.5mg  qd,on Eliquis 5mg  bid, s/p pacemaker             Hyperlipidemia, on Atorvastatin, LDL77 09/10/19             Hypothyroidism, on Levothyroxine 25mcg qd, TSH 3.235.              Hx of  CHF, off Furosemide. HTN, blood pressure is controlled.             Constipation is managed on Colace 200mg  qd, Senokot S II daily, MiraLax qd.   Hx of nonischemic cardiomyopathy, SSS,      Past Medical History:  Diagnosis Date  . AF (atrial fibrillation) [facility])    a. s/p RFCA/PVI @ [facility] clinic 2007.  . Arthritis    "joints" (06/21/2013)  . Atrial flutter ([place])    a. 06/2013 s/p TEE/DCCV  . Chronic systolic CHF (congestive heart failure) ([place])    a. 06/2013 Echo: EF 20%, diff HK, mild LVH.  Marland Kitchen History of cardiovascular stress test    Lexiscan Myoview 8/16:  EF 59%, attenuation artifact, no ischemia; Low Risk  . Hyperlipidemia   . Hypertension   . HYPERTENSION, BENIGN 08/21/2009   Qualifier: Diagnosis of  By: Caryl Comes, MD, Remus Blake  Medications(s)  enalapril   . Hypothyroidism   . Neoplasm of uncertain behavior    SKIN  . Polymyalgia rheumatica ([place]) 01/13/2007   Qualifier: History of  By: Danny Lawless CMA, [place]     . Presence of permanent cardiac pacemaker    Past Surgical History:  Procedure Laterality Date  . ATRIAL FIBRILLATION ABLATION  2007  . CARDIOVERSION N/A 06/22/2013   Procedure: CARDIOVERSION;  Surgeon: Sueanne Margarita, MD;  Location: [place];  Service:  Cardiovascular;  Laterality: N/A;  15:17 Lido 20mg ,IV , Propofol 30 mg, IV synched cardioversion @ 150 joules by Dr. Lajoyce Lauber from a flutter to sinis brady which is baseline for this pt, reports 45-50 reg HR verified by Dr. Ashok Norris  . CARDIOVERSION N/A 11/21/2014   Procedure: CARDIOVERSION;  Surgeon: Fay Records, MD;  Location: [facility] ENDOSCOPY;  Service: Cardiovascular;  Laterality: N/A;  . CARDIOVERSION N/A 07/14/2015   Procedure: CARDIOVERSION;  Surgeon: Sueanne Margarita, MD;  Location: [place];  Service: Cardiovascular;  Laterality: N/A;  . CATARACT EXTRACTION, BILATERAL Bilateral Summer 2009  . CHOLECYSTECTOMY    . EP IMPLANTABLE DEVICE N/A 11/10/2015   Procedure: Pacemaker Implant;  Surgeon: [name], MD;  Location: [place] CV LAB;  Service: Cardiovascular;  Laterality: N/A;  . HAND SURGERY Right    "broke bone; grafted area w/bone from somewhere else"  . HEMORRHOID SURGERY    . INGUINAL HERNIA REPAIR Bilateral   . TEE WITHOUT CARDIOVERSION N/A 06/22/2013   Procedure: TRANSESOPHAGEAL ECHOCARDIOGRAM (TEE);  Surgeon: Sueanne Margarita, MD;  Location: [facility] ENDOSCOPY;  Service: Cardiovascular;  Laterality: N/A;  . TEE WITHOUT CARDIOVERSION N/A 07/14/2015   Procedure: TRANSESOPHAGEAL ECHOCARDIOGRAM (TEE);  Surgeon: Sueanne Margarita, MD;  Location: [facility] ENDOSCOPY;  Service: Cardiovascular;  Laterality: N/A;  . TONSILLECTOMY      No Known Allergies  Allergies as of 11/06/2019   No Known Allergies     Medication List       Accurate as of November 06, 2019  4:30 PM. If you have any questions, ask your nurse or doctor.        acetaminophen 325 MG tablet Commonly known as: TYLENOL Take 650 mg by mouth every 4 (four) hours as needed.   antiseptic oral rinse Liqd 20 mLs by Mouth Rinse route as needed for dry mouth.   atorvastatin 40 MG tablet Commonly known as: LIPITOR Take 40 mg by mouth daily.   carboxymethylcellulose 0.5 % Soln Commonly known as: REFRESH PLUS 1 drop every 4 (four) hours as needed. Each eye   docusate sodium 100 MG capsule Commonly known as: COLACE Take 200 mg by mouth daily.   Eliquis 5 MG Tabs tablet Generic drug: apixaban Take 5 mg by mouth 2 (two) times daily.   glucosamine-chondroitin 500-400 MG tablet Take 1 tablet by mouth 3 (three) times daily.   levothyroxine 50 MCG tablet Commonly known as: SYNTHROID Take 1 tablet (50 mcg total) by mouth daily before breakfast.   metoprolol succinate 25 MG 24 hr tablet Commonly known as: TOPROL-XL Take 12.5 mg by mouth daily.   multivitamin with minerals Tabs tablet Take 1 tablet by mouth daily. Centrum   nitroGLYCERIN 0.4 MG SL tablet Commonly known as: NITROSTAT Place 0.4 mg under the tongue every 5 (five)  minutes as needed for chest pain.   polyethylene glycol 17 g packet Commonly known as: MIRALAX / GLYCOLAX Take 17 g by mouth daily. 17 gram/dose, oral, Once A Morning, Miralax 17 gm PO QD. Hold for diarrhea.   sennosides-docusate sodium 8.6-50 MG tablet Commonly known as: SENOKOT-S Take 2 tablets by mouth daily.       Review of Systems  Constitutional: Positive for activity change and fatigue. Negative for appetite change, chills, diaphoresis and fever.  HENT: Positive for hearing loss. Negative for congestion and voice change.   Eyes: Negative for visual disturbance.  Respiratory: Positive for cough and chest tightness. Negative for apnea, choking, shortness of breath, wheezing and stridor.  Cardiovascular: Positive for chest pain. Negative for palpitations and leg swelling.  Gastrointestinal: Negative for abdominal pain and constipation.  Genitourinary: Positive for frequency. Negative for dysuria and urgency.  Musculoskeletal: Positive for gait problem.  Skin: Negative for color change.  Neurological: Positive for facial asymmetry and weakness. Negative for dizziness, speech difficulty and headaches.  Psychiatric/Behavioral: Positive for sleep disturbance. Negative for behavioral problems. The patient is not nervous/anxious.     Immunization History  Administered Date(s) Administered  . Influenza Split 01/25/2012  . Influenza Whole 02/08/2008, 03/12/2009, 01/16/2010  . Influenza, High Dose Seasonal PF 01/24/2015, 03/04/2017, 02/16/2018, 12/29/2018  . Influenza,inj,Quad PF,6+ Mos 01/25/2013, 01/23/2014  . Influenza-Unspecified 01/02/2016  . PFIZER SARS-COV-2 Vaccination 05/22/2019, 06/12/2019  . Pneumococcal Conjugate-13 01/25/2013  . Pneumococcal Polysaccharide-23 05/28/2016  . Td 01/16/2010  . Tdap 05/06/2019   Pertinent  Health Maintenance Due  Topic Date Due  . INFLUENZA VACCINE  12/02/2019  . PNA vac Low Risk Adult  Completed   Fall Risk  03/23/2019 03/17/2018  02/16/2018 05/28/2016 04/08/2016  Falls in the past year? 0 1 No No No  Comment [facility] Telephone Survey: data to providers prior to load [employer] Telephone Survey: data to providers prior to load - - [facility] Telephone Survey: data to providers prior to load  Number falls in past yr: - 1 - - -  Comment - [facility] Telephone Survey Actual Response = 2 - - -  Injury with Fall? - 1 - - -   Functional Status Survey:    Vitals:   11/06/19 1615  BP: 111/68  Pulse: 64  Resp: 18  Temp: (!) 97.1 F (36.2 C)  SpO2: 97%  Weight: 142 lb 1.6 oz (64.5 kg)  Height: 5\' 9"  (1.753 m)   Body mass index is 20.98 kg/m. Physical Exam Vitals and nursing note reviewed.  Constitutional:      Comments: Generalized weakness, pale looking, closes his eyes   HENT:     Head: Normocephalic and atraumatic.  Eyes:     Extraocular Movements: Extraocular movements intact.     Conjunctiva/sclera: Conjunctivae normal.     Pupils: Pupils are equal, round, and reactive to light.  Cardiovascular:     Rate and Rhythm: Normal rate and regular rhythm.     Heart sounds: No murmur heard.      Comments: Left chest pace maker Pulmonary:     Effort: Pulmonary effort is normal.     Breath sounds: No rales.  Abdominal:     General: Bowel sounds are normal.     Palpations: Abdomen is soft.     Tenderness: There is no abdominal tenderness.  Musculoskeletal:     Cervical back: Normal range of motion and neck supple.     Right lower leg: No edema.     Left lower leg: No edema.  Skin:    Coloration: Skin is pale.  Neurological:     General: No focal deficit present.     Mental Status: He is alert and oriented to person, place, and time. Mental status is at baseline.     Cranial Nerves: Cranial nerve deficit present.     Motor: Weakness present.     Coordination: Coordination abnormal.     Gait: Gait abnormal.     Comments: LUE, LLE weakness with muscle strength 5/5  Psychiatric:        Mood and Affect: Mood normal.         Behavior: Behavior normal.        Thought  Content: Thought content normal.        Judgment: Judgment normal.     Labs reviewed: Recent Labs    09/09/19 1138 09/10/19 0621 09/12/19 0237 09/12/19 0237 09/13/19 0454 09/14/19 0301 09/27/19 0000  NA 141   < > 140   < > 140 141 142  K 4.5   < > 4.2   < > 3.9 4.2 4.1  CL 104   < > 103   < > 106 107 108  CO2 28   < > 26   < > 26 25 26*  GLUCOSE 104*   < > 104*  --  109* 118*  --   BUN 16   < > 28*   < > 33* 33* 33*  CREATININE 0.98   < > 1.17   < > 1.26* 1.14 1.0  CALCIUM 9.3   < > 8.9   < > 8.8* 8.9 9.2  MG 2.0  --   --   --   --   --   --    < > = values in this interval not displayed.   Recent Labs    05/07/19 0512 09/09/19 1138 09/27/19 0000  AST 31 21 17   ALT 8 10 6*  ALKPHOS 59 72 84  BILITOT 1.3* 1.0  --   PROT 6.0* 6.6  --   ALBUMIN 3.4* 3.8 3.9   Recent Labs    05/06/19 1320 05/07/19 0512 09/09/19 1138 09/10/19 0621 09/12/19 0237 09/12/19 0237 09/13/19 0454 09/14/19 0301 09/27/19 0000  WBC 7.8   < > 4.2   < > 6.2   < > 5.6 5.4 6.2  NEUTROABS 6.5  --  3.0  --   --   --   --   --  4,700  HGB 13.6   < > 13.4   < > 13.1   < > 12.6* 12.9* 13.8  HCT 42.6   < > 42.2   < > 40.5   < > 38.4* 39.3 41  MCV 96.4   < > 95.3   < > 93.3  --  93.9 94.0  --   PLT 155   < > 134*   < > 124*   < > 128* 133* 186   < > = values in this interval not displayed.   Lab Results  Component Value Date   TSH 3.939 09/12/2019   Lab Results  Component Value Date   HGBA1C 5.5 09/10/2019   Lab Results  Component Value Date   CHOL 133 09/10/2019   HDL 41 09/10/2019   LDLCALC 77 09/10/2019   TRIG 73 09/10/2019   CHOLHDL 3.2 09/10/2019    Significant Diagnostic Results in last 30 days:  No results found.  Assessment/Plan Chest pain Pale, weakness, chest pain/pressure, not responding to x1 NGT SL, ED to evaluate further.   AF (paroxysmal atrial fibrillation) (HCC) Heart rate is in control, continue Metoprolol, Eliquis.    Cerebral embolism with cerebral infarction Left sided weakness, in SNF [facility] for therapy.   Chronic systolic CHF (congestive heart failure) (HCC) Compensated, off Furosemide.   Slow transit constipation Stable, continue Colace, Senokot S, MiraLax.   Hypothyroidism TSH wnl 09/12/19 3.235, continue Levothyroxine.   Hyperlipidemia LDL 77 09/10/19, continue Atorvastatin.     Family/ staff Communication: plan of care reviewed with the patient, the patient's son/daugher HPOA, and charge nurse.   Labs/tests ordered:  None  Time spend 25 minutes.

## 2019-11-06 NOTE — Assessment & Plan Note (Signed)
LDL 77 09/10/19, continue Atorvastatin.

## 2019-11-06 NOTE — Assessment & Plan Note (Signed)
Heart rate is in control, continue Metoprolol, Eliquis.  

## 2019-11-06 NOTE — Assessment & Plan Note (Signed)
Stable, continue Colace, Senokot S, MiraLax.

## 2019-11-06 NOTE — Assessment & Plan Note (Signed)
TSH wnl 09/12/19 3.235, continue Levothyroxine.

## 2019-11-06 NOTE — Assessment & Plan Note (Signed)
Compensated, off Furosemide.

## 2019-11-08 ENCOUNTER — Encounter: Payer: Self-pay | Admitting: Internal Medicine

## 2019-11-08 ENCOUNTER — Telehealth (INDEPENDENT_AMBULATORY_CARE_PROVIDER_SITE_OTHER): Payer: Medicare Other | Admitting: Internal Medicine

## 2019-11-08 ENCOUNTER — Telehealth: Payer: Self-pay

## 2019-11-08 ENCOUNTER — Non-Acute Institutional Stay (SKILLED_NURSING_FACILITY): Payer: Medicare Other | Admitting: Internal Medicine

## 2019-11-08 ENCOUNTER — Other Ambulatory Visit: Payer: Self-pay

## 2019-11-08 DIAGNOSIS — R1312 Dysphagia, oropharyngeal phase: Secondary | ICD-10-CM

## 2019-11-08 DIAGNOSIS — J189 Pneumonia, unspecified organism: Secondary | ICD-10-CM | POA: Diagnosis not present

## 2019-11-08 DIAGNOSIS — Z950 Presence of cardiac pacemaker: Secondary | ICD-10-CM | POA: Diagnosis not present

## 2019-11-08 DIAGNOSIS — I480 Paroxysmal atrial fibrillation: Secondary | ICD-10-CM

## 2019-11-08 DIAGNOSIS — I10 Essential (primary) hypertension: Secondary | ICD-10-CM

## 2019-11-08 DIAGNOSIS — I63112 Cerebral infarction due to embolism of left vertebral artery: Secondary | ICD-10-CM | POA: Diagnosis not present

## 2019-11-08 DIAGNOSIS — E785 Hyperlipidemia, unspecified: Secondary | ICD-10-CM

## 2019-11-08 NOTE — Patient Instructions (Signed)

## 2019-11-08 NOTE — Telephone Encounter (Signed)
  Patient Consent for Virtual Visit         [name] has provided verbal consent on 11/08/2019 for a virtual visit (video or telephone).   CONSENT FOR VIRTUAL VISIT FOR:  [name]  By participating in this virtual visit I agree to the following:  I hereby voluntarily request, consent and authorize [facility] and its employed or contracted physicians, physician assistants, nurse practitioners or other licensed health care professionals (the Practitioner), to provide me with telemedicine health care services (the "Services") as deemed necessary by the treating Practitioner. I acknowledge and consent to receive the Services by the Practitioner via telemedicine. I understand that the telemedicine visit will involve communicating with the Practitioner through live audiovisual communication technology and the disclosure of certain medical information by electronic transmission. I acknowledge that I have been given the opportunity to request an in-person assessment or other available alternative prior to the telemedicine visit and am voluntarily participating in the telemedicine visit.  I understand that I have the right to withhold or withdraw my consent to the use of telemedicine in the course of my care at any time, without affecting my right to future care or treatment, and that the Practitioner or I may terminate the telemedicine visit at any time. I understand that I have the right to inspect all information obtained and/or recorded in the course of the telemedicine visit and may receive copies of available information for a reasonable fee.  I understand that some of the potential risks of receiving the Services via telemedicine include:  Marland Kitchen Delay or interruption in medical evaluation due to technological equipment failure or disruption; . Information transmitted may not be sufficient (e.g. poor resolution of images) to allow for appropriate medical decision making by the Practitioner;  and/or  . In rare instances, security protocols could fail, causing a breach of personal health information.  Furthermore, I acknowledge that it is my responsibility to provide information about my medical history, conditions and care that is complete and accurate to the best of my ability. I acknowledge that Practitioner's advice, recommendations, and/or decision may be based on factors not within their control, such as incomplete or inaccurate data provided by me or distortions of diagnostic images or specimens that may result from electronic transmissions. I understand that the practice of medicine is not an exact science and that Practitioner makes no warranties or guarantees regarding treatment outcomes. I acknowledge that a copy of this consent can be made available to me via my patient portal ([facility]), or I can request a printed copy by calling the office of [facility].    I understand that my insurance will be billed for this visit.   I have read or had this consent read to me. . I understand the contents of this consent, which adequately explains the benefits and risks of the Services being provided via telemedicine.  . I have been provided ample opportunity to ask questions regarding this consent and the Services and have had my questions answered to my satisfaction. . I give my informed consent for the services to be provided through the use of telemedicine in my medical care

## 2019-11-08 NOTE — Progress Notes (Signed)
Electrophysiology TeleHealth Note   Due to national recommendations of social distancing due to COVID 19, an audio/video telehealth visit is felt to be most appropriate for this patient at this time.  See MyChart message from today for the patient's consent to telehealth for [facility].   Date:  11/08/2019   ID:  [name], DOB 29-Nov-1925, MRN [id number]  Location: patient's home  Provider location: [address] [place]  Evaluation Performed: Follow-up visit  PCP:  [name], MD  Cardiologist:     Electrophysiologist:  SK   Chief Complaint:  Atrial arrhythmia and pacemaker in situ   History of Present Illness:    [name] is a 86 y.o. male who presents via audio/video conferencing for a telehealth visit today.  Since last being seen in our clinic for atrial fib, sinus node dysfunction and pacemaker [employer]tronic  the patient reports intercurrent stroke 5/21 in setting of low therapeutic INR Now struggling with aspiration pneumonia   Struggling with recurrent vomiting so difficulty eating     The patient denies symptoms of fevers, chills, cough, or new SOB worrisome for COVID 19.    Past Medical History:  Diagnosis Date  . AF (atrial fibrillation) [facility])    a. s/p RFCA/PVI @ [facility] clinic 2007.  . Arthritis    "joints" (06/21/2013)  . Atrial flutter ([place])    a. 06/2013 s/p TEE/DCCV  . Chronic systolic CHF (congestive heart failure) ([place])    a. 06/2013 Echo: EF 20%, diff HK, mild LVH.  Marland Kitchen History of cardiovascular stress test    Lexiscan Myoview 8/16:  EF 59%, attenuation artifact, no ischemia; Low Risk  . Hyperlipidemia   . Hypertension   . HYPERTENSION, BENIGN 08/21/2009   Qualifier: Diagnosis of  By: Caryl Comes, MD, Remus Blake  Medications(s)  enalapril   . Hypothyroidism   . Neoplasm of uncertain behavior    SKIN  . Polymyalgia rheumatica ([place]) 01/13/2007   Qualifier: History of  By: Danny Lawless CMA, [place]     . Presence of  permanent cardiac pacemaker     Past Surgical History:  Procedure Laterality Date  . ATRIAL FIBRILLATION ABLATION  2007  . CARDIOVERSION N/A 06/22/2013   Procedure: CARDIOVERSION;  Surgeon: Sueanne Margarita, MD;  Location: [facility] ENDOSCOPY;  Service: Cardiovascular;  Laterality: N/A;  15:17 Lido 20mg ,IV , Propofol 30 mg, IV synched cardioversion @ 150 joules by Dr. Lajoyce Lauber from a flutter to sinis brady which is baseline for this pt, reports 45-50 reg HR verified by Dr. Ashok Norris  . CARDIOVERSION N/A 11/21/2014   Procedure: CARDIOVERSION;  Surgeon: Fay Records, MD;  Location: [facility] ENDOSCOPY;  Service: Cardiovascular;  Laterality: N/A;  . CARDIOVERSION N/A 07/14/2015   Procedure: CARDIOVERSION;  Surgeon: Sueanne Margarita, MD;  Location: [place];  Service: Cardiovascular;  Laterality: N/A;  . CATARACT EXTRACTION, BILATERAL Bilateral Summer 2009  . CHOLECYSTECTOMY    . EP IMPLANTABLE DEVICE N/A 11/10/2015   Procedure: Pacemaker Implant;  Surgeon: [name], MD;  Location: [place] CV LAB;  Service: Cardiovascular;  Laterality: N/A;  . HAND SURGERY Right    "broke bone; grafted area w/bone from somewhere else"  . HEMORRHOID SURGERY    . INGUINAL HERNIA REPAIR Bilateral   . TEE WITHOUT CARDIOVERSION N/A 06/22/2013   Procedure: TRANSESOPHAGEAL ECHOCARDIOGRAM (TEE);  Surgeon: Sueanne Margarita, MD;  Location: [facility] ENDOSCOPY;  Service: Cardiovascular;  Laterality: N/A;  . TEE WITHOUT CARDIOVERSION N/A 07/14/2015  Procedure: TRANSESOPHAGEAL ECHOCARDIOGRAM (TEE);  Surgeon: Sueanne Margarita, MD;  Location: [facility] ENDOSCOPY;  Service: Cardiovascular;  Laterality: N/A;  . TONSILLECTOMY      Current Outpatient Medications  Medication Sig Dispense Refill  . acetaminophen (TYLENOL) 325 MG tablet Take 650 mg by mouth every 4 (four) hours as needed.    Marland Kitchen antiseptic oral rinse (BIOTENE) LIQD 20 mLs by Mouth Rinse route as needed for dry mouth.     Marland Kitchen apixaban (ELIQUIS) 5 MG TABS tablet Take 5 mg by mouth 2 (two) times  daily.    Marland Kitchen atorvastatin (LIPITOR) 40 MG tablet Take 40 mg by mouth daily.    . carboxymethylcellulose (REFRESH PLUS) 0.5 % SOLN 1 drop every 4 (four) hours as needed. Each eye    . docusate sodium (COLACE) 100 MG capsule Take 200 mg by mouth daily.    Marland Kitchen glucosamine-chondroitin 500-400 MG tablet Take 1 tablet by mouth 3 (three) times daily.    Marland Kitchen levofloxacin (LEVAQUIN) 500 MG tablet Take 500 mg by mouth daily.    Marland Kitchen levothyroxine (SYNTHROID) 50 MCG tablet Take 1 tablet (50 mcg total) by mouth daily before breakfast. 90 tablet 1  . metoprolol succinate (TOPROL-XL) 25 MG 24 hr tablet Take 12.5 mg by mouth daily.    . Multiple Vitamin (MULTIVITAMIN WITH MINERALS) TABS tablet Take 1 tablet by mouth daily. Centrum    . nitroGLYCERIN (NITROSTAT) 0.4 MG SL tablet Place 0.4 mg under the tongue every 5 (five) minutes as needed for chest pain.    . polyethylene glycol (MIRALAX / GLYCOLAX) 17 g packet Take 17 g by mouth daily. 17 gram/dose, oral, Once A Morning, Miralax 17 gm PO QD. Hold for diarrhea.    . sennosides-docusate sodium (SENOKOT-S) 8.6-50 MG tablet Take 2 tablets by mouth daily.     No current facility-administered medications for this visit.    Allergies:   Patient has no known allergies.   Social History:  The patient  reports that he quit smoking about 56 years ago. His smoking use included cigarettes. He has a 12.00 pack-year smoking history. He has never used smokeless tobacco. He reports current alcohol use. He reports that he does not use drugs.   Family History:  The patient's   family history includes Diabetes in his mother; Emphysema in his mother; Kidney disease in his father.   ROS:  Please see the history of present illness.   All other systems are personally reviewed and negative.    Exam:    Vital Signs:  There were no vitals taken for this visit.    Labs/Other Tests and Data Reviewed:    Recent Labs: 09/09/2019: Magnesium 2.0 09/12/2019: TSH 3.939 09/27/2019: ALT 6; BUN  33; Creatinine 1.0; Hemoglobin 13.8; Platelets 186; Potassium 4.1; Sodium 142   Wt Readings from Last 3 Encounters:  11/08/19 142 lb 1.6 oz (64.5 kg)  11/06/19 142 lb 1.6 oz (64.5 kg)  11/01/19 142 lb 1.6 oz (64.5 kg)     Other studies personally reviewed: Additional studies/ records that were reviewed today include  Device interrogation 3/21  No arrhtyhmia   ASSESSMENT & PLAN:    Atrial Flutter   sinus bradycardia  NICM thought to be rate related Resolved  Dyspnea on exertion  Pacemaker-Medtronic (DOI 7/17)  Hypertension.  Stroke    Unfortunately stroke with residual L sided weakness and dysphagia resulting in aspiration pneumonia  Discouraged  On anticoagulation  Dyspnea stable, but non ambulatory     COVID 19 screen  The patient denies symptoms of COVID 19 at this time.  The importance of social distancing was discussed today.  Follow-up:  12 m    Current medicines are reviewed at length with the patient today.   The patient does not have concerns regarding his medicines.  The following changes were made today:  none  Labs/ tests ordered today include:   No orders of the defined types were placed in this encounter.   Future tests ( post COVID )     Patient Risk:  after full review of this patients clinical status, I feel that they are at moderat  risk at this time.  Today, I have spent 8 minutes with the patient with telehealth technology discussing the above.  Signed, Virl Axe, MD  11/08/2019 3:48 PM     [facility] [address] [place] [address] [phone] (office) [phone] (fax)

## 2019-11-08 NOTE — Progress Notes (Signed)
Location:    [facility] Room Number: 39 Place of Service:  SNF [phone]) Provider:  Veleta Miners MD   [name], MD  Patient Care Team: [name], MD as PCP - General (Internal Medicine) [name], MD (Ophthalmology) [name], MD (Orthopedic Surgery) [name], MD (Cardiology) [name], MD (General Surgery) [name], MD [facility] Medicine)  Extended Emergency Contact Information Primary Emergency Contact: Hoke,[name] Address: [place], [place] [address] [name] of [place] Phone: [phone] Mobile Phone: [phone] Relation: Spouse Secondary Emergency Contact: Charle, Clear Home Phone: [phone] Relation: Spouse  Code Status:  Full Code Goals of care: Advanced Directive information Advanced Directives 10/12/2019  Does Patient Have a Medical Advance Directive? Yes  Type of Paramedic of [place];Living will  Does patient want to make changes to medical advance directive? No - Patient declined  Copy of [place] in Chart? Yes - validated most recent copy scanned in chart (See row information)  Would patient like information on creating a medical advance directive? -     Chief Complaint  Patient presents with  . Acute Visit    Pneumonia     HPI:  Pt is a 86 y.o. male seen today for an acute visit for Pneumonia and Oropharyngeal Dysphagia   Was in the hospital for Acute Right MCA infarct with left Sided weakness from 5/9-5/14 .MRI scan showed right subcortical infarct most likely embolic etiology  Patient has past medical history of hypertension, hypothyroidism, chronic diastolic CHF, permanent A. fib , traumatic ICH in January 2021 and Collesfracture of right radius, PPM implant.,History of PMR, hyperlipidemia  He developed oropharyngeal dysphagia after that. The patient refused to eat modified diet after much  discussion he was changed to regular diet.  as he was losing weight also  He was sent to ED for chest pain 2 days ago. He was diagnosed with Right Lower Lobe Pneumonia and started on Levaquin The speech therapy thinks it is because of his aspiration risk. Patient wanted to discuss his risks today with me. His wife was also in the room Patient continues to have coughing with thick sputum.  Denies any chest pain anymore no fever or shortness of breath. Since the stroke patient was doing well with therapy he was able to do his transfers and  was able to walk few steps with the walker and mild assist  Past Medical History:  Diagnosis Date  . AF (atrial fibrillation) [facility])    a. s/p RFCA/PVI @ [facility] clinic 2007.  . Arthritis    "joints" (06/21/2013)  . Atrial flutter ([place])    a. 06/2013 s/p TEE/DCCV  . Chronic systolic CHF (congestive heart failure) ([place])    a. 06/2013 Echo: EF 20%, diff HK, mild LVH.  Marland Kitchen History of cardiovascular stress test    Lexiscan Myoview 8/16:  EF 59%, attenuation artifact, no ischemia; Low Risk  . Hyperlipidemia   . Hypertension   . HYPERTENSION, BENIGN 08/21/2009   Qualifier: Diagnosis of  By: Caryl Comes, MD, Remus Blake  Medications(s)  enalapril   . Hypothyroidism   . Neoplasm of uncertain behavior    SKIN  . Polymyalgia rheumatica ([place]) 01/13/2007   Qualifier: History of  By: Danny Lawless CMA, [place]     . Presence of permanent cardiac pacemaker    Past Surgical History:  Procedure Laterality Date  . ATRIAL FIBRILLATION  ABLATION  2007  . CARDIOVERSION N/A 06/22/2013   Procedure: CARDIOVERSION;  Surgeon: Sueanne Margarita, MD;  Location: [facility] ENDOSCOPY;  Service: Cardiovascular;  Laterality: N/A;  15:17 Lido 20mg ,IV , Propofol 30 mg, IV synched cardioversion @ 150 joules by Dr. Lajoyce Lauber from a flutter to sinis brady which is baseline for this pt, reports 45-50 reg HR verified by Dr. Ashok Norris  . CARDIOVERSION N/A 11/21/2014   Procedure: CARDIOVERSION;  Surgeon:  Fay Records, MD;  Location: [facility] ENDOSCOPY;  Service: Cardiovascular;  Laterality: N/A;  . CARDIOVERSION N/A 07/14/2015   Procedure: CARDIOVERSION;  Surgeon: Sueanne Margarita, MD;  Location: [place];  Service: Cardiovascular;  Laterality: N/A;  . CATARACT EXTRACTION, BILATERAL Bilateral Summer 2009  . CHOLECYSTECTOMY    . EP IMPLANTABLE DEVICE N/A 11/10/2015   Procedure: Pacemaker Implant;  Surgeon: [name], MD;  Location: [place] CV LAB;  Service: Cardiovascular;  Laterality: N/A;  . HAND SURGERY Right    "broke bone; grafted area w/bone from somewhere else"  . HEMORRHOID SURGERY    . INGUINAL HERNIA REPAIR Bilateral   . TEE WITHOUT CARDIOVERSION N/A 06/22/2013   Procedure: TRANSESOPHAGEAL ECHOCARDIOGRAM (TEE);  Surgeon: Sueanne Margarita, MD;  Location: [facility] ENDOSCOPY;  Service: Cardiovascular;  Laterality: N/A;  . TEE WITHOUT CARDIOVERSION N/A 07/14/2015   Procedure: TRANSESOPHAGEAL ECHOCARDIOGRAM (TEE);  Surgeon: Sueanne Margarita, MD;  Location: [facility] ENDOSCOPY;  Service: Cardiovascular;  Laterality: N/A;  . TONSILLECTOMY      No Known Allergies  Allergies as of 11/08/2019   No Known Allergies     Medication List       Accurate as of November 08, 2019  9:58 AM. If you have any questions, ask your nurse or doctor.        acetaminophen 325 MG tablet Commonly known as: TYLENOL Take 650 mg by mouth every 4 (four) hours as needed.   antiseptic oral rinse Liqd 20 mLs by Mouth Rinse route as needed for dry mouth.   atorvastatin 40 MG tablet Commonly known as: LIPITOR Take 40 mg by mouth daily.   carboxymethylcellulose 0.5 % Soln Commonly known as: REFRESH PLUS 1 drop every 4 (four) hours as needed. Each eye   docusate sodium 100 MG capsule Commonly known as: COLACE Take 200 mg by mouth daily.   Eliquis 5 MG Tabs tablet Generic drug: apixaban Take 5 mg by mouth 2 (two) times daily.   glucosamine-chondroitin 500-400 MG tablet Take 1 tablet by mouth 3 (three) times daily.     levofloxacin 500 MG tablet Commonly known as: LEVAQUIN Take 500 mg by mouth daily.   levothyroxine 50 MCG tablet Commonly known as: SYNTHROID Take 1 tablet (50 mcg total) by mouth daily before breakfast.   metoprolol succinate 25 MG 24 hr tablet Commonly known as: TOPROL-XL Take 12.5 mg by mouth daily.   multivitamin with minerals Tabs tablet Take 1 tablet by mouth daily. Centrum   nitroGLYCERIN 0.4 MG SL tablet Commonly known as: NITROSTAT Place 0.4 mg under the tongue every 5 (five) minutes as needed for chest pain.   polyethylene glycol 17 g packet Commonly known as: MIRALAX / GLYCOLAX Take 17 g by mouth daily. 17 gram/dose, oral, Once A Morning, Miralax 17 gm PO QD. Hold for diarrhea.   sennosides-docusate sodium 8.6-50 MG tablet Commonly known as: SENOKOT-S Take 2 tablets by mouth daily.       Review of Systems  Constitutional: Positive for activity change.  HENT: Positive for trouble swallowing.  Respiratory: Positive for cough. Negative for shortness of breath.   Cardiovascular: Negative for chest pain and leg swelling.  Gastrointestinal: Positive for constipation.  Genitourinary: Negative.   Musculoskeletal: Positive for gait problem.  Skin: Negative.   Neurological: Positive for weakness.  Psychiatric/Behavioral: Negative.   All other systems reviewed and are negative.   Immunization History  Administered Date(s) Administered  . Influenza Split 01/25/2012  . Influenza Whole 02/08/2008, 03/12/2009, 01/16/2010  . Influenza, High Dose Seasonal PF 01/24/2015, 03/04/2017, 02/16/2018, 12/29/2018  . Influenza,inj,Quad PF,6+ Mos 01/25/2013, 01/23/2014  . Influenza-Unspecified 01/02/2016  . PFIZER SARS-COV-2 Vaccination 05/22/2019, 06/12/2019  . Pneumococcal Conjugate-13 01/25/2013  . Pneumococcal Polysaccharide-23 05/28/2016  . Td 01/16/2010  . Tdap 05/06/2019   Pertinent  Health Maintenance Due  Topic Date Due  . INFLUENZA VACCINE  12/02/2019  . PNA  vac Low Risk Adult  Completed   Fall Risk  03/23/2019 03/17/2018 02/16/2018 05/28/2016 04/08/2016  Falls in the past year? 0 1 No No No  Comment [facility] Telephone Survey: data to providers prior to load [employer] Telephone Survey: data to providers prior to load - - [facility] Telephone Survey: data to providers prior to load  Number falls in past yr: - 1 - - -  Comment - [facility] Telephone Survey Actual Response = 2 - - -  Injury with Fall? - 1 - - -   Functional Status Survey:    Vitals:   11/08/19 0955  BP: (!) 107/58  Pulse: 75  Resp: 20  Temp: 97.9 F (36.6 C)  SpO2: 94%  Weight: 142 lb 1.6 oz (64.5 kg)  Height: 5\' 9"  (1.753 m)   Body mass index is 20.98 kg/m. Physical Exam Vitals reviewed.  Constitutional:      Appearance: Normal appearance.  HENT:     Head: Normocephalic.     Nose: Nose normal.     Mouth/Throat:     Mouth: Mucous membranes are moist.     Pharynx: Oropharynx is clear.  Eyes:     Pupils: Pupils are equal, round, and reactive to light.  Cardiovascular:     Rate and Rhythm: Normal rate and regular rhythm.     Pulses: Normal pulses.     Heart sounds: Normal heart sounds.  Pulmonary:     Effort: Pulmonary effort is normal.     Comments: Few rales in the Right lower Lobe Abdominal:     General: Abdomen is flat. Bowel sounds are normal.     Palpations: Abdomen is soft.  Musculoskeletal:        General: No swelling.     Cervical back: Neck supple.  Skin:    General: Skin is warm.  Neurological:     Mental Status: He is alert and oriented to person, place, and time.     Comments: Left Facial Droop Left Hand 3-4/5 Left Leg 3/5 Right UE and LE was Normal Strength   Psychiatric:        Mood and Affect: Mood normal.        Thought Content: Thought content normal.     Labs reviewed: Recent Labs    09/09/19 1138 09/10/19 0621 09/12/19 0237 09/12/19 0237 09/13/19 0454 09/14/19 0301 09/27/19 0000  NA 141   < > 140   < > 140 141 142  K 4.5   < > 4.2   < >  3.9 4.2 4.1  CL 104   < > 103   < > 106 107 108  CO2 28   < >  26   < > 26 25 26*  GLUCOSE 104*   < > 104*  --  109* 118*  --   BUN 16   < > 28*   < > 33* 33* 33*  CREATININE 0.98   < > 1.17   < > 1.26* 1.14 1.0  CALCIUM 9.3   < > 8.9   < > 8.8* 8.9 9.2  MG 2.0  --   --   --   --   --   --    < > = values in this interval not displayed.   Recent Labs    05/07/19 0512 09/09/19 1138 09/27/19 0000  AST 31 21 17   ALT 8 10 6*  ALKPHOS 59 72 84  BILITOT 1.3* 1.0  --   PROT 6.0* 6.6  --   ALBUMIN 3.4* 3.8 3.9   Recent Labs    05/06/19 1320 05/07/19 0512 09/09/19 1138 09/10/19 0621 09/12/19 0237 09/12/19 0237 09/13/19 0454 09/14/19 0301 09/27/19 0000  WBC 7.8   < > 4.2   < > 6.2   < > 5.6 5.4 6.2  NEUTROABS 6.5  --  3.0  --   --   --   --   --  4,700  HGB 13.6   < > 13.4   < > 13.1   < > 12.6* 12.9* 13.8  HCT 42.6   < > 42.2   < > 40.5   < > 38.4* 39.3 41  MCV 96.4   < > 95.3   < > 93.3  --  93.9 94.0  --   PLT 155   < > 134*   < > 124*   < > 128* 133* 186   < > = values in this interval not displayed.   Lab Results  Component Value Date   TSH 3.939 09/12/2019   Lab Results  Component Value Date   HGBA1C 5.5 09/10/2019   Lab Results  Component Value Date   CHOL 133 09/10/2019   HDL 41 09/10/2019   LDLCALC 77 09/10/2019   TRIG 73 09/10/2019   CHOLHDL 3.2 09/10/2019    Significant Diagnostic Results in last 30 days:  No results found.  Assessment/Plan Pneumonia of right lower lobe due to infectious organism Levaquin for 7 Days Oropharyngeal dysphagia Discussed in detail with patient ,his wife DON and speech therapy. Patient is not doing the chin tuck method not falling the instructions given by speech therapy. He keeps saying he cannot do the thickened liquid.   Finally he has agreed because of his risk being high.   We will change his diet to nectar thick AF (paroxysmal atrial fibrillation) (HCC) On Eliquis now Cerebral infarction due to embolism of left  vertebral artery ([place]) Working with therapy Plans to go back to [place] with his wife ON Statin and Eliquis Hyperlipidemia, unspecified hyperlipidemia type Lipitor  Essential hypertension On Toprol Hypothyroidism On Synthyroid     Component Value Date   TSH 3.939 09/12/2019    Family/ staff Communication:  Labs/tests ordered:    Total time spent in this patient care encounter was  45_  minutes; greater than 50% of the visit spent counseling patient and staff, reviewing records , Labs and coordinating care for problems addressed at this encounter.

## 2019-11-12 ENCOUNTER — Ambulatory Visit: Payer: Self-pay | Admitting: Neurology

## 2019-11-12 ENCOUNTER — Non-Acute Institutional Stay (SKILLED_NURSING_FACILITY): Payer: Medicare Other | Admitting: Nurse Practitioner

## 2019-11-12 ENCOUNTER — Encounter: Payer: Self-pay | Admitting: Nurse Practitioner

## 2019-11-12 DIAGNOSIS — I639 Cerebral infarction, unspecified: Secondary | ICD-10-CM | POA: Diagnosis not present

## 2019-11-12 DIAGNOSIS — R531 Weakness: Secondary | ICD-10-CM

## 2019-11-12 DIAGNOSIS — I10 Essential (primary) hypertension: Secondary | ICD-10-CM

## 2019-11-12 DIAGNOSIS — I480 Paroxysmal atrial fibrillation: Secondary | ICD-10-CM | POA: Diagnosis not present

## 2019-11-12 DIAGNOSIS — E039 Hypothyroidism, unspecified: Secondary | ICD-10-CM

## 2019-11-12 DIAGNOSIS — K5901 Slow transit constipation: Secondary | ICD-10-CM

## 2019-11-12 NOTE — Assessment & Plan Note (Signed)
Left sided weakness has been improved, continue therapy.

## 2019-11-12 NOTE — Assessment & Plan Note (Signed)
Heart rate is in control, blood pressure runs low in setting of gradual worsening in generalized weakness, will dc Metoprolol, monitor VS.

## 2019-11-12 NOTE — Assessment & Plan Note (Addendum)
Gradual worsening, update CBC/diff, CMP/eGFR, PNA is contributory, low blood pressure may be contributory too, will dc Metoprolol 12.40m qd for now since his heart rate is in control. Observe.

## 2019-11-12 NOTE — Assessment & Plan Note (Signed)
Noted lower blood pressure measurements, will dc Metoprolol since his heart rate in in control and he feels weak

## 2019-11-12 NOTE — Assessment & Plan Note (Signed)
Stable, continue Colace, Senokot S, MiraLax.

## 2019-11-12 NOTE — Assessment & Plan Note (Signed)
Stable, TSH 3.9 09/12/19, continue Levothyroxine.

## 2019-11-12 NOTE — Progress Notes (Signed)
Location:    [place] Room Number: 39 Place of Service:  SNF [phone]) Provider:  Marda Stalker, Lennie Odor NP   [name], MD  Patient Care Team: [name], MD as PCP - General (Internal Medicine) [name], MD (Ophthalmology) [name], MD (Orthopedic Surgery) [name], MD (Cardiology) [name], MD (General Surgery) [name], MD (Family Medicine)  Extended Emergency Contact Information Primary Emergency Contact: Clift,[name] Address: [place], [place] [address] [name] of [place] Phone: [phone] Mobile Phone: [phone] Relation: Spouse Secondary Emergency Contact: Placido, [place] Home Phone: [phone] Relation: Spouse  Code Status:  Full Code Goals of care: Advanced Directive information Advanced Directives 10/12/2019  Does Patient Have a Medical Advance Directive? Yes  Type of Paramedic of [place];Living will  Does patient want to make changes to medical advance directive? No - Patient declined  Copy of [place] in Chart? Yes - validated most recent copy scanned in chart (See row information)  Would patient like information on creating a medical advance directive? -     Chief Complaint  Patient presents with  . Acute Visit    f/u PNA     HPI:  Pt is a 86 y.o. male seen today for an acute visit for generalized weakness, gradually worsening since aspiration PNA, treated with Levofloxacin for total 7 days.   Dysphagia, takes thickened liquids.   S/p CVA left sided weakness, MRI showed right subcortical  infarct mostly likely embolic nature.   HTN, blood pressure runs low, takes Metoprolol 12.'5mg'$  qd.  Afib, heart rate is in control. Takes Eliquis  Constipation, takes Senokot S, MiraLax, Colace.   Hypothyroidism, takes Levothyroxine, TSH 3.939 09/12/19     Past Medical History:  Diagnosis Date  . AF (atrial fibrillation)  [facility])    a. s/p RFCA/PVI @ [facility] clinic 2007.  . Arthritis    "joints" (06/21/2013)  . Atrial flutter ([place])    a. 06/2013 s/p TEE/DCCV  . Chronic systolic CHF (congestive heart failure) ([place])    a. 06/2013 Echo: EF 20%, diff HK, mild LVH.  Marland Kitchen History of cardiovascular stress test    Lexiscan Myoview 8/16:  EF 59%, attenuation artifact, no ischemia; Low Risk  . Hyperlipidemia   . Hypertension   . HYPERTENSION, BENIGN 08/21/2009   Qualifier: Diagnosis of  By: Caryl Comes, MD, Remus Blake  Medications(s)  enalapril   . Hypothyroidism   . Neoplasm of uncertain behavior    SKIN  . Polymyalgia rheumatica ([place]) 01/13/2007   Qualifier: History of  By: Danny Lawless CMA, [place]     . Presence of permanent cardiac pacemaker    Past Surgical History:  Procedure Laterality Date  . ATRIAL FIBRILLATION ABLATION  2007  . CARDIOVERSION N/A 06/22/2013   Procedure: CARDIOVERSION;  Surgeon: Sueanne Margarita, MD;  [facility];  Service: Cardiovascular;  Laterality: N/A;  15:17 Lido '20mg'$ ,IV , Propofol 30 mg, IV synched cardioversion @ 150 joules by Dr. Lajoyce Lauber from a flutter to sinis brady which is baseline for this pt, reports 45-50 reg HR verified by Dr. Ashok Norris  . CARDIOVERSION N/A 11/21/2014   Procedure: CARDIOVERSION;  Surgeon: Fay Records, MD;  Location: [place];  Service: Cardiovascular;  Laterality: N/A;  . CARDIOVERSION N/A 07/14/2015   Procedure: CARDIOVERSION;  Surgeon: Sueanne Margarita, MD;  Location: [place];  Service: Cardiovascular;  Laterality: N/A;  .  CATARACT EXTRACTION, BILATERAL Bilateral Summer 2009  . CHOLECYSTECTOMY    . EP IMPLANTABLE DEVICE N/A 11/10/2015   Procedure: Pacemaker Implant;  Surgeon: [name], MD;  Location: [place] CV LAB;  Service: Cardiovascular;  Laterality: N/A;  . HAND SURGERY Right    "broke bone; grafted area w/bone from somewhere else"  . HEMORRHOID SURGERY    . INGUINAL HERNIA REPAIR Bilateral   . TEE WITHOUT CARDIOVERSION N/A  06/22/2013   Procedure: TRANSESOPHAGEAL ECHOCARDIOGRAM (TEE);  Surgeon: Sueanne Margarita, MD;  Location: [facility] ENDOSCOPY;  Service: Cardiovascular;  Laterality: N/A;  . TEE WITHOUT CARDIOVERSION N/A 07/14/2015   Procedure: TRANSESOPHAGEAL ECHOCARDIOGRAM (TEE);  Surgeon: Sueanne Margarita, MD;  Location: [facility] ENDOSCOPY;  Service: Cardiovascular;  Laterality: N/A;  . TONSILLECTOMY      No Known Allergies  Allergies as of 11/12/2019   No Known Allergies     Medication List       Accurate as of November 12, 2019  3:56 PM. If you have any questions, ask your nurse or doctor.        STOP taking these medications   acetaminophen 325 MG tablet Commonly known as: TYLENOL Stopped by: Alexxa Sabet X Eden Toohey, NP     TAKE these medications   antiseptic oral rinse Liqd 20 mLs by Mouth Rinse route as needed for dry mouth.   atorvastatin 40 MG tablet Commonly known as: LIPITOR Take 40 mg by mouth daily.   carboxymethylcellulose 0.5 % Soln Commonly known as: REFRESH PLUS 1 drop every 4 (four) hours as needed. Each eye   docusate sodium 100 MG capsule Commonly known as: COLACE Take 200 mg by mouth daily.   Eliquis 5 MG Tabs tablet Generic drug: apixaban Take 5 mg by mouth 2 (two) times daily.   glucosamine-chondroitin 500-400 MG tablet Take 1 tablet by mouth 3 (three) times daily.   levofloxacin 500 MG tablet Commonly known as: LEVAQUIN Take 500 mg by mouth daily.   levothyroxine 50 MCG tablet Commonly known as: SYNTHROID Take 1 tablet (50 mcg total) by mouth daily before breakfast.   metoprolol succinate 25 MG 24 hr tablet Commonly known as: TOPROL-XL Take 12.5 mg by mouth daily.   multivitamin with minerals Tabs tablet Take 1 tablet by mouth daily. Centrum   nitroGLYCERIN 0.4 MG SL tablet Commonly known as: NITROSTAT Place 0.4 mg under the tongue every 5 (five) minutes as needed for chest pain.   polyethylene glycol 17 g packet Commonly known as: MIRALAX / GLYCOLAX Take 17 g by mouth daily.  17 gram/dose, oral, Once A Morning, Miralax 17 gm PO QD. Hold for diarrhea.   sennosides-docusate sodium 8.6-50 MG tablet Commonly known as: SENOKOT-S Take 2 tablets by mouth daily.       Review of Systems  Constitutional: Positive for activity change, appetite change and fatigue. Negative for fever.  HENT: Positive for hearing loss and trouble swallowing. Negative for congestion and voice change.   Eyes: Negative for visual disturbance.  Respiratory: Negative for cough, shortness of breath and wheezing.   Cardiovascular: Negative for chest pain, palpitations and leg swelling.  Gastrointestinal: Negative for abdominal pain and constipation.  Genitourinary: Positive for frequency. Negative for dysuria and urgency.  Musculoskeletal: Positive for gait problem.  Skin: Negative for color change.  Neurological: Positive for facial asymmetry and weakness. Negative for speech difficulty and light-headedness.  Psychiatric/Behavioral: Positive for sleep disturbance. Negative for behavioral problems. The patient is not nervous/anxious.     Immunization History  Administered Date(s)  Administered  . Influenza Split 01/25/2012  . Influenza Whole 02/08/2008, 03/12/2009, 01/16/2010  . Influenza, High Dose Seasonal PF 01/24/2015, 03/04/2017, 02/16/2018, 12/29/2018  . Influenza,inj,Quad PF,6+ Mos 01/25/2013, 01/23/2014  . Influenza-Unspecified 01/02/2016  . PFIZER SARS-COV-2 Vaccination 05/22/2019, 06/12/2019  . Pneumococcal Conjugate-13 01/25/2013  . Pneumococcal Polysaccharide-23 05/28/2016  . Td 01/16/2010  . Tdap 05/06/2019   Pertinent  Health Maintenance Due  Topic Date Due  . INFLUENZA VACCINE  12/02/2019  . PNA vac Low Risk Adult  Completed   Fall Risk  03/23/2019 03/17/2018 02/16/2018 05/28/2016 04/08/2016  Falls in the past year? 0 1 No No No  Comment [facility] Telephone Survey: data to providers prior to load [employer] Telephone Survey: data to providers prior to load - - [facility] Telephone  Survey: data to providers prior to load  Number falls in past yr: - 1 - - -  Comment - [facility] Telephone Survey Actual Response = 2 - - -  Injury with Fall? - 1 - - -   Functional Status Survey:    Vitals:   11/12/19 0951  BP: (!) 107/58  Pulse: 75  Resp: 20  Temp: 97.9 F (36.6 C)  SpO2: 93%  Weight: 142 lb 1.6 oz (64.5 kg)  Height: '5\' 9"'$  (1.753 m)   Body mass index is 20.98 kg/m. Physical Exam Vitals and nursing note reviewed.  Constitutional:      Comments: Generalized weakness  HENT:     Head: Normocephalic and atraumatic.     Mouth/Throat:     Mouth: Mucous membranes are dry.  Eyes:     Extraocular Movements: Extraocular movements intact.     Conjunctiva/sclera: Conjunctivae normal.     Pupils: Pupils are equal, round, and reactive to light.  Cardiovascular:     Rate and Rhythm: Normal rate and regular rhythm.     Heart sounds: No murmur heard.      Comments: Left chest pace maker Pulmonary:     Effort: Pulmonary effort is normal.     Breath sounds: No rales.  Abdominal:     General: Bowel sounds are normal.     Palpations: Abdomen is soft.     Tenderness: There is no abdominal tenderness.  Musculoskeletal:     Cervical back: Normal range of motion and neck supple.     Right lower leg: No edema.     Left lower leg: No edema.  Skin:    Coloration: Skin is not jaundiced or pale.  Neurological:     General: No focal deficit present.     Mental Status: He is alert and oriented to person, place, and time. Mental status is at baseline.     Cranial Nerves: Cranial nerve deficit present.     Motor: Weakness present.     Coordination: Coordination abnormal.     Gait: Gait abnormal.     Comments: LUE, LLE weakness with muscle strength 5/5  Psychiatric:        Mood and Affect: Mood normal.        Behavior: Behavior normal.        Thought Content: Thought content normal.        Judgment: Judgment normal.     Labs reviewed: Recent Labs    09/09/19 1138  09/10/19 0621 09/12/19 0237 09/12/19 0237 09/13/19 0454 09/14/19 0301 09/27/19 0000  NA 141   < > 140   < > 140 141 142  K 4.5   < > 4.2   < > 3.9 4.2 4.1  CL 104   < > 103   < > 106 107 108  CO2 28   < > 26   < > 26 25 26*  GLUCOSE 104*   < > 104*  --  109* 118*  --   BUN 16   < > 28*   < > 33* 33* 33*  CREATININE 0.98   < > 1.17   < > 1.26* 1.14 1.0  CALCIUM 9.3   < > 8.9   < > 8.8* 8.9 9.2  MG 2.0  --   --   --   --   --   --    < > = values in this interval not displayed.   Recent Labs    05/07/19 0512 09/09/19 1138 09/27/19 0000  AST '31 21 17  '$ ALT 8 10 6*  ALKPHOS 59 72 84  BILITOT 1.3* 1.0  --   PROT 6.0* 6.6  --   ALBUMIN 3.4* 3.8 3.9   Recent Labs    05/06/19 1320 05/07/19 0512 09/09/19 1138 09/10/19 0621 09/12/19 0237 09/12/19 0237 09/13/19 0454 09/14/19 0301 09/27/19 0000  WBC 7.8   < > 4.2   < > 6.2   < > 5.6 5.4 6.2  NEUTROABS 6.5  --  3.0  --   --   --   --   --  4,700  HGB 13.6   < > 13.4   < > 13.1   < > 12.6* 12.9* 13.8  HCT 42.6   < > 42.2   < > 40.5   < > 38.4* 39.3 41  MCV 96.4   < > 95.3   < > 93.3  --  93.9 94.0  --   PLT 155   < > 134*   < > 124*   < > 128* 133* 186   < > = values in this interval not displayed.   Lab Results  Component Value Date   TSH 3.939 09/12/2019   Lab Results  Component Value Date   HGBA1C 5.5 09/10/2019   Lab Results  Component Value Date   CHOL 133 09/10/2019   HDL 41 09/10/2019   LDLCALC 77 09/10/2019   TRIG 73 09/10/2019   CHOLHDL 3.2 09/10/2019    Significant Diagnostic Results in last 30 days:  No results found.  Assessment/Plan Generalized weakness Gradual worsening, update CBC/diff, CMP/eGFR, PNA is contributory, low blood pressure may be contributory too, will dc Metoprolol 12.'5mg'$  qd for now since his heart rate is in control. Observe.   AF (paroxysmal atrial fibrillation) (HCC) Heart rate is in control, blood pressure runs low in setting of gradual worsening in generalized weakness, will  dc Metoprolol, monitor VS.   Acute CVA (cerebrovascular accident) ([place]) Left sided weakness has been improved, continue therapy.   Essential hypertension Noted lower blood pressure measurements, will dc Metoprolol since his heart rate in in control and he feels weak  Hypothyroidism Stable, TSH 3.9 09/12/19, continue Levothyroxine.   Slow transit constipation Stable, continue Colace, Senokot S, MiraLax.      Family/ staff Communication: plan of care reviewed with the patient and charge nurse.   Labs/tests ordered:  CBC/diff, CMP/eGFR   Time spend 35 minutes.

## 2019-11-15 ENCOUNTER — Non-Acute Institutional Stay (SKILLED_NURSING_FACILITY): Payer: Medicare Other | Admitting: Internal Medicine

## 2019-11-15 ENCOUNTER — Encounter: Payer: Self-pay | Admitting: Internal Medicine

## 2019-11-15 DIAGNOSIS — I10 Essential (primary) hypertension: Secondary | ICD-10-CM

## 2019-11-15 DIAGNOSIS — R1312 Dysphagia, oropharyngeal phase: Secondary | ICD-10-CM | POA: Diagnosis not present

## 2019-11-15 DIAGNOSIS — E039 Hypothyroidism, unspecified: Secondary | ICD-10-CM | POA: Diagnosis not present

## 2019-11-15 DIAGNOSIS — I639 Cerebral infarction, unspecified: Secondary | ICD-10-CM | POA: Diagnosis not present

## 2019-11-15 DIAGNOSIS — I480 Paroxysmal atrial fibrillation: Secondary | ICD-10-CM

## 2019-11-15 NOTE — Progress Notes (Signed)
Location:    [place] Room Number: 39 Place of Service:  SNF [phone]) Provider:  Veleta Miners MD   [name], MD  Patient Care Team: [name], MD as PCP - General (Internal Medicine) [name], MD (Ophthalmology) [name], MD (Orthopedic Surgery) [name], MD (Cardiology) [name], MD (General Surgery) [name], MD [facility] Medicine)  Extended Emergency Contact Information Primary Emergency Contact: Justen,[name] Address: [place], [place] [address] [name] of [place] Phone: [phone] Mobile Phone: [phone] Relation: Spouse Secondary Emergency Contact: Thien, [place] Home Phone: [phone] Relation: Spouse  Code Status:  Full Code Goals of care: Advanced Directive information Advanced Directives 10/12/2019  Does Patient Have a Medical Advance Directive? Yes  Type of Paramedic of [place];Living will  Does patient want to make changes to medical advance directive? No - Patient declined  Copy of [place] in Chart? Yes - validated most recent copy scanned in chart (See row information)  Would patient like information on creating a medical advance directive? -     Chief Complaint  Patient presents with  . Acute Visit    dysphagia     HPI:  Pt is a 86 y.o. male seen today for an acute visit for follow-up of dysphagia as requested by the patient  Was in the hospital for Acute Right MCA infarct with left Sided weakness from 5/9-5/14 .MRI scan showed right subcortical infarct most likely embolic etiology Patient has past medical history of hypertension, hypothyroidism, chronic diastolic CHF, permanent A. fib , traumatic ICH in January 2021 and Collesfracture of right radius, PPM implant.,History of PMR, hyperlipidemia  He developed oropharyngeal dysphagia after that. The patient refused to eat modified diet after  much discussion he was changed to regular diet.  as he was losing weight also  He was sent to ED for chest pain 2 days ago. He was diagnosed with Right Lower Lobe Pneumonia and started on Levaquin The speech therapy thinks it is because of his aspiration risk. Patient is now back on nectar thick diet. He is doing much better coughing and spitting less.  But continues to complain about the quality of food.  He asked me today if he needs IV fluids he also wanted to know how long he needs to stay on this diet.       Past Medical History:  Diagnosis Date  . AF (atrial fibrillation) [facility])    a. s/p RFCA/PVI @ [facility] clinic 2007.  . Arthritis    "joints" (06/21/2013)  . Atrial flutter ([place])    a. 06/2013 s/p TEE/DCCV  . Chronic systolic CHF (congestive heart failure) ([place])    a. 06/2013 Echo: EF 20%, diff HK, mild LVH.  Marland Kitchen History of cardiovascular stress test    Lexiscan Myoview 8/16:  EF 59%, attenuation artifact, no ischemia; Low Risk  . Hyperlipidemia   . Hypertension   . HYPERTENSION, BENIGN 08/21/2009   Qualifier: Diagnosis of  By: Caryl Comes, MD, Remus Blake  Medications(s)  enalapril   . Hypothyroidism   . Neoplasm of uncertain behavior    SKIN  . Polymyalgia rheumatica ([place]) 01/13/2007   Qualifier: History of  By: Danny Lawless CMA, [place]     . Presence of permanent cardiac pacemaker    Past Surgical History:  Procedure Laterality Date  . ATRIAL FIBRILLATION ABLATION  2007  . CARDIOVERSION N/A 06/22/2013  Procedure: CARDIOVERSION;  Surgeon: Sueanne Margarita, MD;  [facility];  Service: Cardiovascular;  Laterality: N/A;  15:17 Lido 20mg ,IV , Propofol 30 mg, IV synched cardioversion @ 150 joules by Dr. Lajoyce Lauber from a flutter to sinis brady which is baseline for this pt, reports 45-50 reg HR verified by Dr. Ashok Norris  . CARDIOVERSION N/A 11/21/2014   Procedure: CARDIOVERSION;  Surgeon: Fay Records, MD;  Location: [facility] ENDOSCOPY;  Service: Cardiovascular;  Laterality: N/A;   . CARDIOVERSION N/A 07/14/2015   Procedure: CARDIOVERSION;  Surgeon: Sueanne Margarita, MD;  Location: [place];  Service: Cardiovascular;  Laterality: N/A;  . CATARACT EXTRACTION, BILATERAL Bilateral Summer 2009  . CHOLECYSTECTOMY    . EP IMPLANTABLE DEVICE N/A 11/10/2015   Procedure: Pacemaker Implant;  Surgeon: [name], MD;  Location: [place] CV LAB;  Service: Cardiovascular;  Laterality: N/A;  . HAND SURGERY Right    "broke bone; grafted area w/bone from somewhere else"  . HEMORRHOID SURGERY    . INGUINAL HERNIA REPAIR Bilateral   . TEE WITHOUT CARDIOVERSION N/A 06/22/2013   Procedure: TRANSESOPHAGEAL ECHOCARDIOGRAM (TEE);  Surgeon: Sueanne Margarita, MD;  Location[facility] ENDOSCOPY;  Service: Cardiovascular;  Laterality: N/A;  . TEE WITHOUT CARDIOVERSION N/A 07/14/2015   Procedure: TRANSESOPHAGEAL ECHOCARDIOGRAM (TEE);  Surgeon: Sueanne Margarita, MD;  Location: [facility] ENDOSCOPY;  Service: Cardiovascular;  Laterality: N/A;  . TONSILLECTOMY      No Known Allergies  Allergies as of 11/15/2019   No Known Allergies     Medication List       Accurate as of November 15, 2019 11:08 AM. If you have any questions, ask your nurse or doctor.        STOP taking these medications   metoprolol succinate 25 MG 24 hr tablet Commonly known as: TOPROL-XL Stopped by: [name], MD   nitroGLYCERIN 0.4 MG SL tablet Commonly known as: NITROSTAT Stopped by: [name], MD     TAKE these medications   antiseptic oral rinse Liqd 20 mLs by Mouth Rinse route as needed for dry mouth.   atorvastatin 40 MG tablet Commonly known as: LIPITOR Take 40 mg by mouth daily.   carboxymethylcellulose 0.5 % Soln Commonly known as: REFRESH PLUS 1 drop every 4 (four) hours as needed. Each eye   docusate sodium 100 MG capsule Commonly known as: COLACE Take 200 mg by mouth daily.   Eliquis 5 MG Tabs tablet Generic drug: apixaban Take 5 mg by mouth 2 (two) times daily.   glucosamine-chondroitin  500-400 MG tablet Take 1 tablet by mouth 3 (three) times daily.   levothyroxine 50 MCG tablet Commonly known as: SYNTHROID Take 1 tablet (50 mcg total) by mouth daily before breakfast.   multivitamin with minerals Tabs tablet Take 1 tablet by mouth daily. Centrum   polyethylene glycol 17 g packet Commonly known as: MIRALAX / GLYCOLAX Take 17 g by mouth daily. 17 gram/dose, oral, Once A Morning, Miralax 17 gm PO QD. Hold for diarrhea.   sennosides-docusate sodium 8.6-50 MG tablet Commonly known as: SENOKOT-S Take 2 tablets by mouth daily.       Review of Systems  Constitutional: Positive for appetite change.  HENT: Negative.   Respiratory: Positive for cough.   Cardiovascular: Negative.   Gastrointestinal: Negative.   Genitourinary: Negative.   Musculoskeletal: Positive for gait problem.  Skin: Negative.   Neurological: Positive for weakness.  Psychiatric/Behavioral: Negative.     Immunization History  Administered Date(s) Administered  .  Influenza Split 01/25/2012  . Influenza Whole 02/08/2008, 03/12/2009, 01/16/2010  . Influenza, High Dose Seasonal PF 01/24/2015, 03/04/2017, 02/16/2018, 12/29/2018  . Influenza,inj,Quad PF,6+ Mos 01/25/2013, 01/23/2014  . Influenza-Unspecified 01/02/2016  . PFIZER SARS-COV-2 Vaccination 05/22/2019, 06/12/2019  . Pneumococcal Conjugate-13 01/25/2013  . Pneumococcal Polysaccharide-23 05/28/2016  . Td 01/16/2010  . Tdap 05/06/2019   Pertinent  Health Maintenance Due  Topic Date Due  . INFLUENZA VACCINE  12/02/2019  . PNA vac Low Risk Adult  Completed   Fall Risk  03/23/2019 03/17/2018 02/16/2018 05/28/2016 04/08/2016  Falls in the past year? 0 1 No No No  Comment [facility] Telephone Survey: data to providers prior to load [employer] Telephone Survey: data to providers prior to load - - [facility] Telephone Survey: data to providers prior to load  Number falls in past yr: - 1 - - -  Comment - [facility] Telephone Survey Actual Response = 2 - - -  Injury  with Fall? - 1 - - -   Functional Status Survey:    Vitals:   11/15/19 1105  BP: (!) 150/86  Pulse: 87  Resp: 20  Temp: (!) 97.1 F (36.2 C)  SpO2: 95%  Weight: 142 lb 1.6 oz (64.5 kg)  Height: 5\' 9"  (1.753 m)   Body mass index is 20.98 kg/m. Physical Exam Vitals reviewed.  Constitutional:      Appearance: Normal appearance.  HENT:     Head: Normocephalic.     Nose: Nose normal.     Mouth/Throat:     Mouth: Mucous membranes are moist.     Pharynx: Oropharynx is clear.  Eyes:     Pupils: Pupils are equal, round, and reactive to light.  Cardiovascular:     Rate and Rhythm: Normal rate and regular rhythm.     Pulses: Normal pulses.  Pulmonary:     Effort: Pulmonary effort is normal.     Breath sounds: Normal breath sounds. No wheezing or rales.  Abdominal:     General: Abdomen is flat. Bowel sounds are normal.     Palpations: Abdomen is soft.  Musculoskeletal:        General: No swelling.  Skin:    General: Skin is warm.  Neurological:     Mental Status: He is alert and oriented to person, place, and time.     Comments:  Left Facial Droop Left Hand 3-4/5 Left Leg 3/5 Right UE and LE was Normal Strength    Psychiatric:        Mood and Affect: Mood normal.     Labs reviewed: Recent Labs    09/09/19 1138 09/10/19 0621 09/12/19 0237 09/12/19 0237 09/13/19 0454 09/14/19 0301 09/27/19 0000  NA 141   < > 140   < > 140 141 142  K 4.5   < > 4.2   < > 3.9 4.2 4.1  CL 104   < > 103   < > 106 107 108  CO2 28   < > 26   < > 26 25 26*  GLUCOSE 104*   < > 104*  --  109* 118*  --   BUN 16   < > 28*   < > 33* 33* 33*  CREATININE 0.98   < > 1.17   < > 1.26* 1.14 1.0  CALCIUM 9.3   < > 8.9   < > 8.8* 8.9 9.2  MG 2.0  --   --   --   --   --   --    < > =  values in this interval not displayed.   Recent Labs    05/07/19 0512 09/09/19 1138 09/27/19 0000  AST 31 21 17   ALT 8 10 6*  ALKPHOS 59 72 84  BILITOT 1.3* 1.0  --   PROT 6.0* 6.6  --   ALBUMIN 3.4* 3.8  3.9   Recent Labs    05/06/19 1320 05/07/19 0512 09/09/19 1138 09/10/19 0621 09/12/19 0237 09/12/19 0237 09/13/19 0454 09/14/19 0301 09/27/19 0000  WBC 7.8   < > 4.2   < > 6.2   < > 5.6 5.4 6.2  NEUTROABS 6.5  --  3.0  --   --   --   --   --  4,700  HGB 13.6   < > 13.4   < > 13.1   < > 12.6* 12.9* 13.8  HCT 42.6   < > 42.2   < > 40.5   < > 38.4* 39.3 41  MCV 96.4   < > 95.3   < > 93.3  --  93.9 94.0  --   PLT 155   < > 134*   < > 124*   < > 128* 133* 186   < > = values in this interval not displayed.   Lab Results  Component Value Date   TSH 3.939 09/12/2019   Lab Results  Component Value Date   HGBA1C 5.5 09/10/2019   Lab Results  Component Value Date   CHOL 133 09/10/2019   HDL 41 09/10/2019   LDLCALC 77 09/10/2019   TRIG 73 09/10/2019   CHOLHDL 3.2 09/10/2019    Significant Diagnostic Results in last 30 days:  No results found.  Assessment/Plan Oropharyngeal dysphagia Patient is following the nectar thick diet at this time He is worried that he is not getting enough nutrition He has lost 5 pounds states he has been in the facility. 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

## 2019-11-16 DIAGNOSIS — Q6689 Other  specified congenital deformities of feet: Secondary | ICD-10-CM | POA: Diagnosis not present

## 2019-11-16 DIAGNOSIS — L602 Onychogryphosis: Secondary | ICD-10-CM | POA: Diagnosis not present

## 2019-11-16 DIAGNOSIS — L84 Corns and callosities: Secondary | ICD-10-CM | POA: Diagnosis not present

## 2019-11-28 ENCOUNTER — Encounter: Payer: Self-pay | Admitting: Nurse Practitioner

## 2019-11-28 ENCOUNTER — Ambulatory Visit (INDEPENDENT_AMBULATORY_CARE_PROVIDER_SITE_OTHER): Payer: Medicare Other | Admitting: Neurology

## 2019-11-28 ENCOUNTER — Non-Acute Institutional Stay (SKILLED_NURSING_FACILITY): Payer: Medicare Other | Admitting: Nurse Practitioner

## 2019-11-28 ENCOUNTER — Encounter: Payer: Self-pay | Admitting: Neurology

## 2019-11-28 VITALS — BP 118/71 | HR 88 | Ht 69.0 in | Wt 129.0 lb

## 2019-11-28 DIAGNOSIS — J189 Pneumonia, unspecified organism: Secondary | ICD-10-CM | POA: Diagnosis not present

## 2019-11-28 DIAGNOSIS — F339 Major depressive disorder, recurrent, unspecified: Secondary | ICD-10-CM | POA: Diagnosis not present

## 2019-11-28 DIAGNOSIS — K5901 Slow transit constipation: Secondary | ICD-10-CM | POA: Diagnosis not present

## 2019-11-28 DIAGNOSIS — I63112 Cerebral infarction due to embolism of left vertebral artery: Secondary | ICD-10-CM

## 2019-11-28 DIAGNOSIS — I63411 Cerebral infarction due to embolism of right middle cerebral artery: Secondary | ICD-10-CM

## 2019-11-28 DIAGNOSIS — E039 Hypothyroidism, unspecified: Secondary | ICD-10-CM | POA: Diagnosis not present

## 2019-11-28 DIAGNOSIS — I10 Essential (primary) hypertension: Secondary | ICD-10-CM

## 2019-11-28 DIAGNOSIS — I4891 Unspecified atrial fibrillation: Secondary | ICD-10-CM | POA: Diagnosis not present

## 2019-11-28 DIAGNOSIS — R1312 Dysphagia, oropharyngeal phase: Secondary | ICD-10-CM

## 2019-11-28 NOTE — Progress Notes (Signed)
Location:    [place] Room Number: 39 Place of Service:  SNF [phone]) Provider:  Marda Stalker, Lennie Odor NP   [name], MD  Patient Care Team: [name], MD as PCP - General (Internal Medicine) [name], MD (Ophthalmology) [name], MD (Orthopedic Surgery) [name], MD (Cardiology) [name], MD (General Surgery) [name], MD (Family Medicine)  Extended Emergency Contact Information Primary Emergency Contact: Liao,[name] Address: [place], [place] [address] [name] of [place] Phone: [phone] Mobile Phone: [phone] Relation: Spouse Secondary Emergency Contact: Arius, Harnois Home Phone: [phone] Relation: Spouse  Code Status:  Full Code Goals of care: Advanced Directive information Advanced Directives 10/12/2019  Does Patient Have a Medical Advance Directive? Yes  Type of Paramedic of [place];Living will  Does patient want to make changes to medical advance directive? No - Patient declined  Copy of [place] in Chart? Yes - validated most recent copy scanned in chart (See row information)  Would patient like information on creating a medical advance directive? -     Chief Complaint  Patient presents with  . Acute Visit    Cough    HPI:  Pt is a 86 y.o. male seen today for an acute visit for persisted cough, afebrile, no O2 desaturation. Treated right lower lung pneumonia with 7 day course of Levaquin. Repeated CXR 11/27/19 showed mild patchy right lung air space density compatible with pneumonia, 7 day course of Levaquin 500mg  qd started subsequently.   Dysphagia, takes thickened liquids.              S/p CVA left sided weakness, MRI showed right subcortical  infarct mostly likely embolic nature.              HTN,  takes Metoprolol qd.             Afib, heart rate is in control. Takes Eliquis, Metoprolol,  Neurology f/u  11/28/19             Constipation, takes Senokot S, MiraLax, Colace.              Hypothyroidism, takes Levothyroxine, TSH 3.939 09/12/19  Mood/appetite/sleep, takes Mirtazapine.   Past Medical History:  Diagnosis Date  . AF (atrial fibrillation) [facility])    a. s/p RFCA/PVI @ [facility] clinic 2007.  . Arthritis    "joints" (06/21/2013)  . Atrial flutter ([place])    a. 06/2013 s/p TEE/DCCV  . Chronic systolic CHF (congestive heart failure) ([place])    a. 06/2013 Echo: EF 20%, diff HK, mild LVH.  Marland Kitchen History of cardiovascular stress test    Lexiscan Myoview 8/16:  EF 59%, attenuation artifact, no ischemia; Low Risk  . Hyperlipidemia   . Hypertension   . HYPERTENSION, BENIGN 08/21/2009   Qualifier: Diagnosis of  By: Caryl Comes, MD, Remus Blake  Medications(s)  enalapril   . Hypothyroidism   . Neoplasm of uncertain behavior    SKIN  . Polymyalgia rheumatica ([place]) 01/13/2007   Qualifier: History of  By: Danny Lawless CMA, [place]     . Presence of permanent cardiac pacemaker    Past Surgical History:  Procedure Laterality Date  . ATRIAL FIBRILLATION ABLATION  2007  . CARDIOVERSION N/A 06/22/2013   Procedure: CARDIOVERSION;  Surgeon: Sueanne Margarita, MD;  Location: [facility] ENDOSCOPY;  Service: Cardiovascular;  Laterality: N/A;  15:17 Lido 20mg ,IV , Propofol 30  mg, IV synched cardioversion @ 150 joules by Dr. Lajoyce Lauber from a flutter to sinis brady which is baseline for this pt, reports 45-50 reg HR verified by Dr. Ashok Norris  . CARDIOVERSION N/A 11/21/2014   Procedure: CARDIOVERSION;  Surgeon: Fay Records, MD;  Location: [facility] END[facility]COPY;  Service: Cardiovascular;  Laterality: N/A;  . CARDIOVERSION N/A 07/14/2015   Procedure: CARDIOVERSION;  Surgeon: Sueanne Margarita, MD;  Location: [facility];  Service: Cardiovascular;  Laterality: N/A;  . CATARACT EXTRACTION, BILATERAL Bilateral Summer 2009  . CHOLECYSTECTOMY    . EP IMPLANTABLE DEVICE N/A 11/10/2015   Procedure: Pacemaker Implant;  Surgeon: [name], MD;   Location: [facility] CV LAB;  Service: Cardiovascular;  Laterality: N/A;  . HAND SURGERY Right    "broke bone; grafted area w/bone from somewhere else"  . HEMORRHOID SURGERY    . INGUINAL HERNIA REPAIR Bilateral   . TEE WITHOUT CARDIOVERSION N/A 06/22/2013   Procedure: TRANSESOPHAGEAL ECHOCARDIOGRAM (TEE);  Surgeon: Sueanne Margarita, MD;  Location: [name] ENDOSCOPY;  Service: Cardiovascular;  Laterality: N/A;  . TEE WITHOUT CARDIOVERSION N/A 07/14/2015   Procedure: TRANSESOPHAGEAL ECHOCARDIOGRAM (TEE);  Surgeon: Sueanne Margarita, MD;  Location: [facility] ENDOSCOPY;  Service: Cardiovascular;  Laterality: N/A;  . TONSILLECTOMY      No Known Allergies  Allergies as of 11/28/2019   No Known Allergies     Medication List       Accurate as of November 28, 2019 11:59 PM. If you have any questions, ask your nurse or doctor.        acetaminophen 325 MG tablet Commonly known as: TYLENOL Take 650 mg by mouth every 4 (four) hours as needed.   antiseptic oral rinse Liqd 20 mLs by Mouth Rinse route as needed for dry mouth.   atorvastatin 40 MG tablet Commonly known as: LIPITOR Take 40 mg by mouth daily.   carboxymethylcellulose 0.5 % Soln Commonly known as: REFRESH PLUS 1 drop every 4 (four) hours as needed. Each eye   docusate sodium 100 MG capsule Commonly known as: COLACE Take 200 mg by mouth daily.   Eliquis 5 MG Tabs tablet Generic drug: apixaban Take 5 mg by mouth 2 (two) times daily.   glucosamine-chondroitin 500-400 MG tablet Take 1 tablet by mouth 3 (three) times daily.   guaiFENesin 600 MG 12 hr tablet Commonly known as: MUCINEX Take 600 mg by mouth 2 (two) times daily.   levofloxacin 500 MG tablet Commonly known as: LEVAQUIN Take 500 mg by mouth daily.   levothyroxine 50 MCG tablet Commonly known as: SYNTHROID Take 1 tablet (50 mcg total) by mouth daily before breakfast.   Metoprolol Succinate 25 MG Cs24 Take by mouth.   mirtazapine 15 MG tablet Commonly known as:  REMERON Take 7.5 mg by mouth at bedtime.   multivitamin with minerals Tabs tablet Take 1 tablet by mouth daily. Centrum   nitroGLYCERIN 0.4 MG SL tablet Commonly known as: NITROSTAT Place 0.4 mg under the tongue every 5 (five) minutes as needed for chest pain.   polyethylene glycol 17 g packet Commonly known as: MIRALAX / GLYCOLAX Take 17 g by mouth daily. 17 gram/dose, oral, Once A Morning, Miralax 17 gm PO QD. Hold for diarrhea.   sennosides-docusate sodium 8.6-50 MG tablet Commonly known as: SENOKOT-S Take 2 tablets by mouth daily.       Review of Systems  Constitutional: Positive for fatigue. Negative for appetite change and fever.  HENT: Positive for hearing loss and trouble swallowing. Negative  for congestion and voice change.   Eyes: Negative for visual disturbance.  Respiratory: Positive for cough. Negative for chest tightness, shortness of breath and wheezing.        Phlegm   Cardiovascular: Negative for chest pain, palpitations and leg swelling.  Gastrointestinal: Negative for abdominal pain and constipation.  Genitourinary: Positive for frequency. Negative for dysuria and urgency.  Musculoskeletal: Positive for gait problem.  Skin: Negative for color change.  Neurological: Positive for facial asymmetry and weakness. Negative for speech difficulty and light-headedness.  Psychiatric/Behavioral: Negative for behavioral problems and sleep disturbance. The patient is not nervous/anxious.     Immunization History  Administered Date(s) Administered  . Influenza Split 01/25/2012  . Influenza Whole 02/08/2008, 03/12/2009, 01/16/2010  . Influenza, High Dose Seasonal PF 01/24/2015, 03/04/2017, 02/16/2018, 12/29/2018  . Influenza,inj,Quad PF,6+ Mos 01/25/2013, 01/23/2014  . Influenza-Unspecified 01/02/2016  . PFIZER SARS-COV-2 Vaccination 05/22/2019, 06/12/2019  . Pneumococcal Conjugate-13 01/25/2013  . Pneumococcal Polysaccharide-23 05/28/2016  . Td 01/16/2010  . Tdap  05/06/2019   Pertinent  Health Maintenance Due  Topic Date Due  . INFLUENZA VACCINE  12/02/2019  . PNA vac Low Risk Adult  Completed   Fall Risk  03/23/2019 03/17/2018 02/16/2018 05/28/2016 04/08/2016  Falls in the past year? 0 1 No No No  Comment [employer] Telephone Survey: data to providers prior to load [employer] Telephone Survey: data to providers prior to load - - [employer] Telephone Survey: data to providers prior to load  Number falls in past yr: - 1 - - -  Comment - [employer] Telephone Survey Actual Response = 2 - - -  Injury with Fall? - 1 - - -   Functional Status Survey:    Vitals:   11/28/19 1003  BP: (!) 143/83  Pulse: 70  Resp: 22  Temp: (!) 97.4 F (36.3 C)  SpO2: 96%  Weight: 131 lb 8 oz (59.6 kg)  Height: 5\' 9"  (1.753 m)   Body mass index is 19.42 kg/m. Physical Exam Vitals and nursing note reviewed.  Constitutional:      Comments: Generalized weakness  HENT:     Head: Normocephalic and atraumatic.     Mouth/Throat:     Mouth: Mucous membranes are moist.  Eyes:     Extraocular Movements: Extraocular movements intact.     Conjunctiva/sclera: Conjunctivae normal.     Pupils: Pupils are equal, round, and reactive to light.  Cardiovascular:     Rate and Rhythm: Normal rate and regular rhythm.     Heart sounds: No murmur heard.      Comments: Left chest pace maker Pulmonary:     Effort: Pulmonary effort is normal.     Breath sounds: Rales present. No wheezing or rhonchi.  Abdominal:     General: Bowel sounds are normal.     Palpations: Abdomen is soft.     Tenderness: There is no abdominal tenderness.  Musculoskeletal:     Cervical back: Normal range of motion and neck supple.     Right lower leg: No edema.     Left lower leg: No edema.  Skin:    Coloration: Skin is not jaundiced or pale.  Neurological:     Mental Status: He is alert and oriented to person, place, and time. Mental status is at baseline.     Cranial Nerves: Cranial nerve deficit present.     Motor:  Weakness present.     Coordination: Coordination abnormal.     Gait: Gait abnormal.     Comments:  LUE, LLE weakness with muscle strength 5/5  Psychiatric:        Mood and Affect: Mood normal.        Behavior: Behavior normal.        Thought Content: Thought content normal.        Judgment: Judgment normal.     Labs reviewed: Recent Labs    09/09/19 1138 09/10/19 0621 09/12/19 0237 09/12/19 0237 09/13/19 0454 09/14/19 0301 09/27/19 0000  NA 141   < > 140   < > 140 141 142  K 4.5   < > 4.2   < > 3.9 4.2 4.1  CL 104   < > 103   < > 106 107 108  CO2 28   < > 26   < > 26 25 26*  GLUCOSE 104*   < > 104*  --  109* 118*  --   BUN 16   < > 28*   < > 33* 33* 33*  CREATININE 0.98   < > 1.17   < > 1.26* 1.14 1.0  CALCIUM 9.3   < > 8.9   < > 8.8* 8.9 9.2  MG 2.0  --   --   --   --   --   --    < > = values in this interval not displayed.   Recent Labs    05/07/19 0512 09/09/19 1138 09/27/19 0000  AST 31 21 17   ALT 8 10 6*  ALKPHOS 59 72 84  BILITOT 1.3* 1.0  --   PROT 6.0* 6.6  --   ALBUMIN 3.4* 3.8 3.9   Recent Labs    05/06/19 1320 05/07/19 0512 09/09/19 1138 09/10/19 0621 09/12/19 0237 09/12/19 0237 09/13/19 0454 09/14/19 0301 09/27/19 0000  WBC 7.8   < > 4.2   < > 6.2   < > 5.6 5.4 6.2  NEUTROABS 6.5  --  3.0  --   --   --   --   --  4,700  HGB 13.6   < > 13.4   < > 13.1   < > 12.6* 12.9* 13.8  HCT 42.6   < > 42.2   < > 40.5   < > 38.4* 39.3 41  MCV 96.4   < > 95.3   < > 93.3  --  93.9 94.0  --   PLT 155   < > 134*   < > 124*   < > 128* 133* 186   < > = values in this interval not displayed.   Lab Results  Component Value Date   TSH 3.939 09/12/2019   Lab Results  Component Value Date   HGBA1C 5.5 09/10/2019   Lab Results  Component Value Date   CHOL 133 09/10/2019   HDL 41 09/10/2019   LDLCALC 77 09/10/2019   TRIG 73 09/10/2019   CHOLHDL 3.2 09/10/2019    Significant Diagnostic Results in last 30 days:  No results  found.  Assessment/Plan Pneumonia involving right lung Persisted cough, afebrile, no O2 desaturation. Treated right lower lung pneumonia with 7 day course of Levaquin. Repeated CXR 11/27/19 showed mild patchy right lung air space density compatible with pneumonia, 7 day course of Levaquin 500mg  qd started subsequently. Will Mucinex 600mg  bid x 7days.    Dysphagia Risk for aspiration, continue Thickened liquid.   Slow transit constipation Stable, continue Senokot S, MiraLax, Colace.   Hypothyroidism Stable, 09/12/19 TSH wnl, continue Levothyroxine.   Depression, recurrent ([place]) Continue  Mirtazapine for  mood/appetite/sleep,   Atrial fibrillation (HCC) Heart rate is in control, continue Metoprolol, Eliquis.   Essential hypertension Blood pressure is controlled.   CVA (cerebral vascular accident) [facility]) S/p CVA left sided weakness, MRI showed right subcortical  infarct mostly likely embolic nature.  Continue Statin, Eliquis.     Family/ staff Communication: plan of care reviewed with the patient and charge nurse.   Labs/tests ordered:  CXR  Time spend 35 minutes.

## 2019-11-28 NOTE — Assessment & Plan Note (Signed)
Heart rate is in control, continue Metoprolol, Eliquis.  

## 2019-11-28 NOTE — Assessment & Plan Note (Signed)
S/p CVA left sided weakness, MRI showed right subcortical  infarct mostly likely embolic nature.  Continue Statin, Eliquis.

## 2019-11-28 NOTE — Progress Notes (Signed)
[facility] [address] [place]. [place] [address] [phone]       OFFICE FOLLOW-UP NOTE  [name] Date of Birth:  1925/11/29 Medical Record Number:  [id number]   HPI: [name] is a 86 year old pleasant Caucasian male seen today for initial office follow-up visit following hospital consultation for stroke in May 2021.  He is accompanied by his daughter [name].  History is obtained from them, review of electronic medical records and I personally reviewed imaging films in PACS.  He has a past medical history of hypertension, chronic systolic heart failure, polymyalgia rheumatica, paroxysmal atrial fibrillation, chronic kidney disease, nonischemic cardiomyopathy, hypothyroidism, traumatic right frontal intracranial hemorrhage in January 2021 and hyperlipidemia who presented to [facility] emergency room on 09/08/2019 with sudden onset of slurred speech difficulty walking.  On assessment he had no lateralizing signs and CT head was unremarkable except for scattered lacune's and he was discharged home.  He came back the next day with worsening symptoms with some left-sided weakness CT angiogram of the head and neck showed 50% left ICA bifurcation stenosis and moderate multifocal intracranial atherosclerotic changes but no large vessel occlusion.  MRI scan showed a right deep insular and white matter infarct with old right frontal white matter infarct and abnormal flow in the distal left vertebral artery.  2D echo showed ejection fraction of 60 to 65% and no clot.  LDL cholesterol 77 mg percent.  Hemoglobin A1c was 5.5.  Patient was on warfarin for anticoagulation and INR on admission was 2.1.  Patient was switched to Eliquis for stroke prevention and transferred back to [place] [facility] [place] where he was living in the independent living to the rehab section.  Patient is still there getting physical occupational and speech therapy daily.  He still has left-sided weakness as well as  significant gait and balance difficulties as well as dysphagia and dysarthria.  He does not like being on a diet with thickened liquids.  He needs 1 person assist to walk with a walker with wheelchair behind him and hence walks a little.  Patient states he was doing reasonably well until a few weeks ago when he suffered a setback due to pneumonia.  He was given a course of antibiotics for 1 week and did not get completely better with shortness of breath and poor stamina.  He is recently been restarted on antibiotics 2 days ago.  He remains on Lipitor which is tolerating well with muscle aches and pains.  His blood pressures well controlled today it is 118/71.  He is tolerating Eliquis without bruising or bleeding.  ROS:   14 system review of systems is positive for speech difficulty, swallowing difficulty, weakness, incoordination, gait difficulty, memory loss and all other systems negative PMH:  Past Medical History:  Diagnosis Date  . AF (atrial fibrillation) [facility])    a. s/p RFCA/PVI @ [facility] clinic 2007.  . Arthritis    "joints" (06/21/2013)  . Atrial flutter ([place])    a. 06/2013 s/p TEE/DCCV  . Chronic systolic CHF (congestive heart failure) ([place])    a. 06/2013 Echo: EF 20%, diff HK, mild LVH.  Marland Kitchen History of cardiovascular stress test    Lexiscan Myoview 8/16:  EF 59%, attenuation artifact, no ischemia; Low Risk  . Hyperlipidemia   . Hypertension   . HYPERTENSION, BENIGN 08/21/2009   Qualifier: Diagnosis of  By: Caryl Comes, MD, Remus Blake  Medications(s)  enalapril   . Hypothyroidism   . Neoplasm of uncertain behavior  SKIN  . Polymyalgia rheumatica ([place]) 01/13/2007   Qualifier: History of  By: Danny Lawless CMA, [place]     . Presence of permanent cardiac pacemaker     Social History:  Social History   Socioeconomic History  . Marital status: Married    Spouse name: [name]  . Number of children: 3  . Years of education: 90  . Highest education level: Not on file    Occupational History  . Occupation: RETIRED Administrator, sports: RETIRED    Comment: Still active w/stocks  . Occupation: RETIRED    Employer: RETIRED  Tobacco Use  . Smoking status: Former Smoker    Packs/day: 1.00    Years: 12.00    Pack years: 12.00    Types: Cigarettes    Quit date: 05/04/1963    Years since quitting: 56.6  . Smokeless tobacco: Never Used  . Tobacco comment: quit cigars 35'   Substance and Sexual Activity  . Alcohol use: Yes    Comment: 06/21/2013 "I was a moderate drinker at most; rarely have drink now"  . Drug use: No  . Sexual activity: Never    Partners: Female  Other Topics Concern  . Not on file  Social History Narrative   ** Merged History Encounter **       [employer], BA,  MBA, Chiropodist. Married 1958. 2 -girls '59, '62; 1 son '64; 2 grandchildren. Retired Customer service manager. END of LIFE CARE: Yes - DNR, yes - for short term mechanical ventilation.has a living will, would not want any heroic/futile care.  Wife sustained fracture proximal humerus left - Sept '11   Social Determinants of Health   Financial Resource Strain:   . Difficulty of Paying Living Expenses:   Food Insecurity:   . Worried About Charity fundraiser in the Last Year:   . Arboriculturist in the Last Year:   Transportation Needs:   . Film/video editor (Medical):   Marland Kitchen Lack of Transportation (Non-Medical):   Physical Activity:   . Days of Exercise per Week:   . Minutes of Exercise per Session:   Stress:   . Feeling of Stress :   Social Connections:   . Frequency of Communication with Friends and Family:   . Frequency of Social Gatherings with Friends and Family:   . Attends Religious Services:   . Active Member of Clubs or Organizations:   . Attends Archivist Meetings:   Marland Kitchen Marital Status:   Intimate Partner Violence:   . Fear of Current or Ex-Partner:   . Emotionally Abused:   Marland Kitchen Physically Abused:   . Sexually Abused:     Medications:    Current Outpatient Medications on File Prior to Visit  Medication Sig Dispense Refill  . acetaminophen (TYLENOL) 325 MG tablet Take 650 mg by mouth every 4 (four) hours as needed.    Marland Kitchen antiseptic oral rinse (BIOTENE) LIQD 20 mLs by Mouth Rinse route as needed for dry mouth.     Marland Kitchen apixaban (ELIQUIS) 5 MG TABS tablet Take 5 mg by mouth 2 (two) times daily.    Marland Kitchen atorvastatin (LIPITOR) 40 MG tablet Take 40 mg by mouth daily.    . carboxymethylcellulose (REFRESH PLUS) 0.5 % SOLN 1 drop every 4 (four) hours as needed. Each eye    . docusate sodium (COLACE) 100 MG capsule Take 200 mg by mouth daily.    Marland Kitchen glucosamine-chondroitin 500-400 MG tablet Take 1 tablet by  mouth 3 (three) times daily.    Marland Kitchen levothyroxine (SYNTHROID) 50 MCG tablet Take 1 tablet (50 mcg total) by mouth daily before breakfast. 90 tablet 1  . Metoprolol Succinate 25 MG CS24 Take by mouth.    . Multiple Vitamin (MULTIVITAMIN WITH MINERALS) TABS tablet Take 1 tablet by mouth daily. Centrum    . nitroGLYCERIN (NITROSTAT) 0.4 MG SL tablet Place 0.4 mg under the tongue every 5 (five) minutes as needed for chest pain.    . polyethylene glycol (MIRALAX / GLYCOLAX) 17 g packet Take 17 g by mouth daily. 17 gram/dose, oral, Once A Morning, Miralax 17 gm PO QD. Hold for diarrhea.    . sennosides-docusate sodium (SENOKOT-S) 8.6-50 MG tablet Take 2 tablets by mouth daily.     No current facility-administered medications on file prior to visit.    Allergies:  No Known Allergies  Physical Exam General: Frail elderly Caucasian male seated, in no evident distress Head: head normocephalic and atraumatic.  Neck: supple with no carotid or supraclavicular bruits Cardiovascular: regular rate and rhythm, no murmurs Musculoskeletal: no deformity Skin:  no rash/petichiae Vascular:  Normal pulses all extremities Vitals:   11/28/19 1326  BP: 118/71  Pulse: 88   Neurologic Exam Mental Status: Awake and fully alert. Oriented to place and time.  Recent and remote memory diminished. Attention span, concentration and fund of knowledge diminished. Mood and affect appropriate.  Speech is dysarthric but no aphasia. Cranial Nerves: Fundoscopic exam reveals sharp disc margins. Pupils equal, briskly reactive to light. Extraocular movements full without nystagmus. Visual fields full to confrontation. Hearing intact. Facial sensation intact. Face, tongue, palate moves normally and symmetrically.  Motor: Mild spastic left hemiparesis with left upper extremity strength 3/5 with significant weakness of grip and intrinsic hand muscles.  Mild left lower extremity drift with weakness of hip flexors and ankle dorsiflexors only.  Diminished fine finger movements on the left and orbits right over left upper extremity. Sensory.: intact to touch ,pinprick .position and vibratory sensation.  Coordination: Impaired finger-to-nose and needle coordination on the left and intact on the right. Gait and Station: Not tested as patient is unable and requires 1 person assist with a walker which she did not bring Reflexes: 2+ and asymmetric and brisker on the left. Toes downgoing.   NIHSS  5 Modified Rankin  3   ASSESSMENT: 86 year old Caucasian male with embolic right MCA infarct in May 2021 from atrial fibrillation despite optimal anticoagulation with warfarin.  He has residual left hemiparesis and dysphagia.     PLAN: I had a long d/w patient about his recent embolic stroke, atrial fibrillation,risk for recurrent stroke/TIAs, personally independently reviewed imaging studies and stroke evaluation results and answered questions.Continue Eliquis (apixaban) daily  for secondary stroke prevention and maintain strict control of hypertension with blood pressure goal below 130/90, diabetes with hemoglobin A1c goal below 6.5% and lipids with LDL cholesterol goal below 70 mg/dL. I also advised the patient to eat a healthy diet with plenty of whole grains, cereals, fruits and  vegetables, exercise regularly and maintain ideal body weight.  Continue ongoing physical, occupational and speech therapies.  Followup in the future with my nurse practitioner Janett Billow in 3 months or call earlier if necessary. Greater than 50% of time during this 30 minute visit was spent on counseling,explanation of diagnosis of embolic stroke, dysphagia, planning of further management, discussion with patient and family and coordination of care Antony Contras, MD  [facility] [address] [place], [place]  [address]  Phone [phone] Fax [phone] Note: This document was prepared with digital dictation and possible smart phrase technology. Any transcriptional errors that result from this process are unintentional

## 2019-11-28 NOTE — Assessment & Plan Note (Signed)
Persisted cough, afebrile, no O2 desaturation. Treated right lower lung pneumonia with 7 day course of Levaquin. Repeated CXR 11/27/19 showed mild patchy right lung air space density compatible with pneumonia, 7 day course of Levaquin 500mg  qd started subsequently. Will Mucinex 600mg  bid x 7days.

## 2019-11-28 NOTE — Assessment & Plan Note (Signed)
Risk for aspiration, continue Thickened liquid.

## 2019-11-28 NOTE — Assessment & Plan Note (Signed)
Stable, 09/12/19 TSH wnl, continue Levothyroxine.

## 2019-11-28 NOTE — Assessment & Plan Note (Signed)
Continue Mirtazapine for  mood/appetite/sleep,

## 2019-11-28 NOTE — Assessment & Plan Note (Signed)
Blood pressure is controlled

## 2019-11-28 NOTE — Assessment & Plan Note (Signed)
Stable, continue Senokot S, MiraLax, Colace 

## 2019-11-28 NOTE — Patient Instructions (Signed)
I had a long d/w patient about his recent embolic stroke, atrial fibrillation,risk for recurrent stroke/TIAs, personally independently reviewed imaging studies and stroke evaluation results and answered questions.Continue Eliquis (apixaban) daily  for secondary stroke prevention and maintain strict control of hypertension with blood pressure goal below 130/90, diabetes with hemoglobin A1c goal below 6.5% and lipids with LDL cholesterol goal below 70 mg/dL. I also advised the patient to eat a healthy diet with plenty of whole grains, cereals, fruits and vegetables, exercise regularly and maintain ideal body weight.  Continue ongoing physical, occupational and speech therapies.  Followup in the future with my nurse practitioner Janett Billow in 3 months or call earlier if necessary.  Stroke Prevention Some medical conditions and behaviors are associated with a higher chance of having a stroke. You can help prevent a stroke by making nutrition, lifestyle, and other changes, including managing any medical conditions you may have. What nutrition changes can be made?   Eat healthy foods. You can do this by: ? Choosing foods high in fiber, such as fresh fruits and vegetables and whole grains. ? Eating at least 5 or more servings of fruits and vegetables a day. Try to fill half of your plate at each meal with fruits and vegetables. ? Choosing lean protein foods, such as lean cuts of meat, poultry without skin, fish, tofu, beans, and nuts. ? Eating low-fat dairy products. ? Avoiding foods that are high in salt (sodium). This can help lower blood pressure. ? Avoiding foods that have saturated fat, trans fat, and cholesterol. This can help prevent high cholesterol. ? Avoiding processed and premade foods.  Follow your health care provider's specific guidelines for losing weight, controlling high blood pressure (hypertension), lowering high cholesterol, and managing diabetes. These may include: ? Reducing your daily  calorie intake. ? Limiting your daily sodium intake to 1,500 milligrams (mg). ? Using only healthy fats for cooking, such as olive oil, canola oil, or sunflower oil. ? Counting your daily carbohydrate intake. What lifestyle changes can be made?  Maintain a healthy weight. Talk to your health care provider about your ideal weight.  Get at least 30 minutes of moderate physical activity at least 5 days a week. Moderate activity includes brisk walking, biking, and swimming.  Do not use any products that contain nicotine or tobacco, such as cigarettes and e-cigarettes. If you need help quitting, ask your health care provider. It may also be helpful to avoid exposure to secondhand smoke.  Limit alcohol intake to no more than 1 drink a day for nonpregnant women and 2 drinks a day for men. One drink equals 12 oz of beer, 5 oz of wine, or 1 oz of hard liquor.  Stop any illegal drug use.  Avoid taking birth control pills. Talk to your health care provider about the risks of taking birth control pills if: ? You are over 62 years old. ? You smoke. ? You get migraines. ? You have ever had a blood clot. What other changes can be made?  Manage your cholesterol levels. ? Eating a healthy diet is important for preventing high cholesterol. If cholesterol cannot be managed through diet alone, you may also need to take medicines. ? Take any prescribed medicines to control your cholesterol as told by your health care provider.  Manage your diabetes. ? Eating a healthy diet and exercising regularly are important parts of managing your blood sugar. If your blood sugar cannot be managed through diet and exercise, you may need to take medicines. ?  Take any prescribed medicines to control your diabetes as told by your health care provider.  Control your hypertension. ? To reduce your risk of stroke, try to keep your blood pressure below 130/80. ? Eating a healthy diet and exercising regularly are an  important part of controlling your blood pressure. If your blood pressure cannot be managed through diet and exercise, you may need to take medicines. ? Take any prescribed medicines to control hypertension as told by your health care provider. ? Ask your health care provider if you should monitor your blood pressure at home. ? Have your blood pressure checked every year, even if your blood pressure is normal. Blood pressure increases with age and some medical conditions.  Get evaluated for sleep disorders (sleep apnea). Talk to your health care provider about getting a sleep evaluation if you snore a lot or have excessive sleepiness.  Take over-the-counter and prescription medicines only as told by your health care provider. Aspirin or blood thinners (antiplatelets or anticoagulants) may be recommended to reduce your risk of forming blood clots that can lead to stroke.  Make sure that any other medical conditions you have, such as atrial fibrillation or atherosclerosis, are managed. What are the warning signs of a stroke? The warning signs of a stroke can be easily remembered as BEFAST.  B is for balance. Signs include: ? Dizziness. ? Loss of balance or coordination. ? Sudden trouble walking.  E is for eyes. Signs include: ? A sudden change in vision. ? Trouble seeing.  F is for face. Signs include: ? Sudden weakness or numbness of the face. ? The face or eyelid drooping to one side.  A is for arms. Signs include: ? Sudden weakness or numbness of the arm, usually on one side of the body.  S is for speech. Signs include: ? Trouble speaking (aphasia). ? Trouble understanding.  T is for time. ? These symptoms may represent a serious problem that is an emergency. Do not wait to see if the symptoms will go away. Get medical help right away. Call your local emergency services (911 in the [place] Do not drive yourself to the hospital.  Other signs of stroke may include: ? A sudden,  severe headache with no known cause. ? Nausea or vomiting. ? Seizure. Where to find more information For more information, visit:  American Stroke Association: www.strokeassociation.org  National Stroke Association: www.stroke.org Summary  You can prevent a stroke by eating healthy, exercising, not smoking, limiting alcohol intake, and managing any medical conditions you may have.  Do not use any products that contain nicotine or tobacco, such as cigarettes and e-cigarettes. If you need help quitting, ask your health care provider. It may also be helpful to avoid exposure to secondhand smoke.  Remember BEFAST for warning signs of stroke. Get help right away if you or a loved one has any of these signs. This information is not intended to replace advice given to you by your health care provider. Make sure you discuss any questions you have with your health care provider. Document Revised: 04/01/2017 Document Reviewed: 05/25/2016 Elsevier Patient Education  2020 [employer].

## 2019-11-29 ENCOUNTER — Encounter: Payer: Self-pay | Admitting: Nurse Practitioner

## 2019-12-07 ENCOUNTER — Other Ambulatory Visit (HOSPITAL_COMMUNITY): Payer: Self-pay | Admitting: *Deleted

## 2019-12-07 DIAGNOSIS — R059 Cough, unspecified: Secondary | ICD-10-CM

## 2019-12-07 DIAGNOSIS — R1310 Dysphagia, unspecified: Secondary | ICD-10-CM

## 2019-12-10 ENCOUNTER — Non-Acute Institutional Stay (SKILLED_NURSING_FACILITY): Payer: Medicare Other | Admitting: Nurse Practitioner

## 2019-12-10 ENCOUNTER — Encounter: Payer: Self-pay | Admitting: Nurse Practitioner

## 2019-12-10 DIAGNOSIS — E039 Hypothyroidism, unspecified: Secondary | ICD-10-CM | POA: Diagnosis not present

## 2019-12-10 DIAGNOSIS — F339 Major depressive disorder, recurrent, unspecified: Secondary | ICD-10-CM | POA: Diagnosis not present

## 2019-12-10 DIAGNOSIS — I10 Essential (primary) hypertension: Secondary | ICD-10-CM | POA: Diagnosis not present

## 2019-12-10 DIAGNOSIS — R1312 Dysphagia, oropharyngeal phase: Secondary | ICD-10-CM | POA: Diagnosis not present

## 2019-12-10 DIAGNOSIS — K5901 Slow transit constipation: Secondary | ICD-10-CM

## 2019-12-10 DIAGNOSIS — I4891 Unspecified atrial fibrillation: Secondary | ICD-10-CM

## 2019-12-10 DIAGNOSIS — I63112 Cerebral infarction due to embolism of left vertebral artery: Secondary | ICD-10-CM | POA: Diagnosis not present

## 2019-12-10 DIAGNOSIS — W19XXXA Unspecified fall, initial encounter: Secondary | ICD-10-CM

## 2019-12-10 NOTE — Progress Notes (Signed)
Location:    [place] Room Number: 39 Place of Service:  SNF ([phone]) Provider:  Marda Stalker, Lennie Odor NP  [name], MD  Patient Care Team: [name], MD as PCP - General (Internal Medicine) [name], MD (Ophthalmology) [name], MD (Orthopedic Surgery) [name], MD (Cardiology) [name], MD (General Surgery) [name], MD (Family Medicine)  Extended Emergency Contact Information Primary Emergency Contact: Rorke,[name] Address: [place], [place] [address] [name] of [place] Phone: [phone] Mobile Phone: [phone] Relation: Spouse Secondary Emergency Contact: Read, Bonelli Home Phone: [phone] Relation: Spouse  Code Status:  Full Code Goals of care: Advanced Directive information Advanced Directives 10/12/2019  Does Patient Have a Medical Advance Directive? Yes  Type of Paramedic of [place];Living will  Does patient want to make changes to medical advance directive? No - Patient declined  Copy of [place] in Chart? Yes - validated most recent copy scanned in chart (See row information)  Would patient like information on creating a medical advance directive? -     Chief Complaint  Patient presents with  . Acute Visit    Fall    HPI:  Pt is a 86 y.o. male seen today for an acute visit for reported 12/09/19 the patient was found on the floor in sitting position upright against the w/c, slid down of w/c when the cushion underneath him slid out of the w/c, no apparent injury.   Dysphagia, takes thickened liquids.  S/p CVA left sided weakness, MRI showed right subcortical infarct mostly likely embolic nature.  HTN,  takes Metoprolol qd. Afib, heart rate is in control. Takes Eliquis, Metoprolol.  Constipation, takes Senokot S, MiraLax, Colace.  Hypothyroidism, takes  Levothyroxine, TSH 3.939 09/12/19             Mood/appetite/sleep, takes Mirtazapine.    Past Medical History:  Diagnosis Date  . AF (atrial fibrillation) [facility])    a. s/p RFCA/PVI @ [facility] clinic 2007.  . Arthritis    "joints" (06/21/2013)  . Atrial flutter ([place])    a. 06/2013 s/p TEE/DCCV  . Chronic systolic CHF (congestive heart failure) ([place])    a. 06/2013 Echo: EF 20%, diff HK, mild LVH.  Marland Kitchen History of cardiovascular stress test    Lexiscan Myoview 8/16:  EF 59%, attenuation artifact, no ischemia; Low Risk  . Hyperlipidemia   . Hypertension   . HYPERTENSION, BENIGN 08/21/2009   Qualifier: Diagnosis of  By: Caryl Comes, MD, Remus Blake  Medications(s)  enalapril   . Hypothyroidism   . Neoplasm of uncertain behavior    SKIN  . Polymyalgia rheumatica ([place]) 01/13/2007   Qualifier: History of  By: Danny Lawless CMA, [place]     . Presence of permanent cardiac pacemaker    Past Surgical History:  Procedure Laterality Date  . ATRIAL FIBRILLATION ABLATION  2007  . CARDIOVERSION N/A 06/22/2013   Procedure: CARDIOVERSION;  Surgeon: Sueanne Margarita, MD;  Location: [facility] ENDOSCOPY;  Service: Cardiovascular;  Laterality: N/A;  15:17 Lido 20mg ,IV , Propofol 30 mg, IV synched cardioversion @ 150 joules by Dr. Lajoyce Lauber from a flutter to sinis brady which is baseline for this pt, reports 45-50 reg HR verified by Dr. Ashok Norris  . CARDIOVERSION N/A 11/21/2014   Procedure: CARDIOVERSION;  Surgeon: Fay Records, MD;  Location: [place];  Service: Cardiovascular;  Laterality: N/A;  . CARDIOVERSION N/A  07/14/2015   Procedure: CARDIOVERSION;  Surgeon: Sueanne Margarita, MD;  Location: [facility];  Service: Cardiovascular;  Laterality: N/A;  . CATARACT EXTRACTION, BILATERAL Bilateral Summer 2009  . CHOLECYSTECTOMY    . EP IMPLANTABLE DEVICE N/A 11/10/2015   Procedure: Pacemaker Implant;  Surgeon: [name], MD;  Location: [place] CV LAB;  Service: Cardiovascular;  Laterality: N/A;  . HAND SURGERY Right     "broke bone; grafted area w/bone from somewhere else"  . HEMORRHOID SURGERY    . INGUINAL HERNIA REPAIR Bilateral   . TEE WITHOUT CARDIOVERSION N/A 06/22/2013   Procedure: TRANSESOPHAGEAL ECHOCARDIOGRAM (TEE);  Surgeon: Sueanne Margarita, MD;  Location: [facility] ENDOSCOPY;  Service: Cardiovascular;  Laterality: N/A;  . TEE WITHOUT CARDIOVERSION N/A 07/14/2015   Procedure: TRANSESOPHAGEAL ECHOCARDIOGRAM (TEE);  Surgeon: Sueanne Margarita, MD;  Location: [facility] ENDOSCOPY;  Service: Cardiovascular;  Laterality: N/A;  . TONSILLECTOMY      No Known Allergies  Allergies as of 12/10/2019   No Known Allergies     Medication List       Accurate as of December 10, 2019 11:59 PM. If you have any questions, ask your nurse or doctor.        STOP taking these medications   levofloxacin 500 MG tablet Commonly known as: LEVAQUIN Stopped by: Caeley Dohrmann X Annastasia Haskins, NP   nitroGLYCERIN 0.4 MG SL tablet Commonly known as: NITROSTAT Stopped by: Charmin Aguiniga X Kento Gossman, NP     TAKE these medications   acetaminophen 325 MG tablet Commonly known as: TYLENOL Take 650 mg by mouth every 4 (four) hours as needed.   antiseptic oral rinse Liqd 20 mLs by Mouth Rinse route as needed for dry mouth.   atorvastatin 40 MG tablet Commonly known as: LIPITOR Take 40 mg by mouth daily.   carboxymethylcellulose 0.5 % Soln Commonly known as: REFRESH PLUS 1 drop every 4 (four) hours as needed. Each eye   docusate sodium 100 MG capsule Commonly known as: COLACE Take 200 mg by mouth daily.   Eliquis 5 MG Tabs tablet Generic drug: apixaban Take 5 mg by mouth 2 (two) times daily.   glucosamine-chondroitin 500-400 MG tablet Take 1 tablet by mouth 3 (three) times daily.   levothyroxine 50 MCG tablet Commonly known as: SYNTHROID Take 1 tablet (50 mcg total) by mouth daily before breakfast.   Metoprolol Succinate 25 MG Cs24 Take by mouth.   mirtazapine 15 MG tablet Commonly known as: REMERON Take 7.5 mg by mouth at bedtime.   multivitamin  with minerals Tabs tablet Take 1 tablet by mouth daily. Centrum   polyethylene glycol 17 g packet Commonly known as: MIRALAX / GLYCOLAX Take 17 g by mouth daily. 17 gram/dose, oral, Once A Morning, Miralax 17 gm PO QD. Hold for diarrhea.   RESOURCE 2.0 PO Take by mouth in the morning, at noon, and at bedtime.   sennosides-docusate sodium 8.6-50 MG tablet Commonly known as: SENOKOT-S Take 2 tablets by mouth daily.       Review of Systems  Constitutional: Negative for appetite change, fatigue and fever.  HENT: Positive for hearing loss and trouble swallowing. Negative for congestion and voice change.   Eyes: Negative for visual disturbance.  Respiratory: Positive for cough. Negative for chest tightness, shortness of breath and wheezing.        Phlegm   Cardiovascular: Negative for chest pain, palpitations and leg swelling.  Gastrointestinal: Negative for abdominal pain and constipation.  Genitourinary: Positive for frequency. Negative for dysuria and urgency.  Musculoskeletal: Positive for gait problem.  Skin: Negative for color change.  Neurological: Positive for facial asymmetry and weakness. Negative for speech difficulty and light-headedness.  Psychiatric/Behavioral: Negative for behavioral problems and sleep disturbance. The patient is not nervous/anxious.     Immunization History  Administered Date(s) Administered  . Influenza Split 01/25/2012  . Influenza Whole 02/08/2008, 03/12/2009, 01/16/2010  . Influenza, High Dose Seasonal PF 01/24/2015, 03/04/2017, 02/16/2018, 12/29/2018  . Influenza,inj,Quad PF,6+ Mos 01/25/2013, 01/23/2014  . Influenza-Unspecified 01/02/2016  . PFIZER SARS-COV-2 Vaccination 05/22/2019, 06/12/2019  . Pneumococcal Conjugate-13 01/25/2013  . Pneumococcal Polysaccharide-23 05/28/2016  . Td 01/16/2010  . Tdap 05/06/2019   Pertinent  Health Maintenance Due  Topic Date Due  . INFLUENZA VACCINE  12/02/2019  . PNA vac Low Risk Adult  Completed    Fall Risk  03/23/2019 03/17/2018 02/16/2018 05/28/2016 04/08/2016  Falls in the past year? 0 1 No No No  Comment [facility] Telephone Survey: data to providers prior to load [employer] Telephone Survey: data to providers prior to load - - [facility] Telephone Survey: data to providers prior to load  Number falls in past yr: - 1 - - -  Comment - [facility] Telephone Survey Actual Response = 2 - - -  Injury with Fall? - 1 - - -   Functional Status Survey:    Vitals:   12/10/19 1608  BP: 138/82  Pulse: 75  Resp: 18  Temp: (!) 97.4 F (36.3 C)  SpO2: 97%  Weight: 127 lb 9.6 oz (57.9 kg)  Height: 5\' 9"  (1.753 m)   Body mass index is 18.84 kg/m. Physical Exam Vitals and nursing note reviewed.  Constitutional:      Appearance: Normal appearance.  HENT:     Head: Normocephalic and atraumatic.     Mouth/Throat:     Mouth: Mucous membranes are moist.  Eyes:     Extraocular Movements: Extraocular movements intact.     Conjunctiva/sclera: Conjunctivae normal.     Pupils: Pupils are equal, round, and reactive to light.  Cardiovascular:     Rate and Rhythm: Normal rate and regular rhythm.     Heart sounds: No murmur heard.      Comments: Left chest pace maker Pulmonary:     Effort: Pulmonary effort is normal.     Breath sounds: Rales present. No wheezing or rhonchi.     Comments: Mostly left mid to lower lungs with deep breathing Abdominal:     General: Bowel sounds are normal.     Palpations: Abdomen is soft.     Tenderness: There is no abdominal tenderness.  Musculoskeletal:     Cervical back: Normal range of motion and neck supple.     Right lower leg: No edema.     Left lower leg: No edema.  Skin:    Coloration: Skin is not jaundiced or pale.  Neurological:     Mental Status: He is alert and oriented to person, place, and time. Mental status is at baseline.     Cranial Nerves: Cranial nerve deficit present.     Motor: Weakness present.     Coordination: Coordination abnormal.     Gait:  Gait abnormal.     Comments: LUE, LLE weakness with muscle strength 5/5  Psychiatric:        Mood and Affect: Mood normal.        Behavior: Behavior normal.        Thought Content: Thought content normal.        Judgment: Judgment  normal.     Labs reviewed: Recent Labs    09/09/19 1138 09/10/19 0621 09/12/19 0237 09/12/19 0237 09/13/19 0454 09/14/19 0301 09/27/19 0000  NA 141   < > 140   < > 140 141 142  K 4.5   < > 4.2   < > 3.9 4.2 4.1  CL 104   < > 103   < > 106 107 108  CO2 28   < > 26   < > 26 25 26*  GLUCOSE 104*   < > 104*  --  109* 118*  --   BUN 16   < > 28*   < > 33* 33* 33*  CREATININE 0.98   < > 1.17   < > 1.26* 1.14 1.0  CALCIUM 9.3   < > 8.9   < > 8.8* 8.9 9.2  MG 2.0  --   --   --   --   --   --    < > = values in this interval not displayed.   Recent Labs    05/07/19 0512 09/09/19 1138 09/27/19 0000  AST 31 21 17   ALT 8 10 6*  ALKPHOS 59 72 84  BILITOT 1.3* 1.0  --   PROT 6.0* 6.6  --   ALBUMIN 3.4* 3.8 3.9   Recent Labs    05/06/19 1320 05/07/19 0512 09/09/19 1138 09/10/19 0621 09/12/19 0237 09/12/19 0237 09/13/19 0454 09/14/19 0301 09/27/19 0000  WBC 7.8   < > 4.2   < > 6.2   < > 5.6 5.4 6.2  NEUTROABS 6.5  --  3.0  --   --   --   --   --  4,700  HGB 13.6   < > 13.4   < > 13.1   < > 12.6* 12.9* 13.8  HCT 42.6   < > 42.2   < > 40.5   < > 38.4* 39.3 41  MCV 96.4   < > 95.3   < > 93.3  --  93.9 94.0  --   PLT 155   < > 134*   < > 124*   < > 128* 133* 186   < > = values in this interval not displayed.   Lab Results  Component Value Date   TSH 3.939 09/12/2019   Lab Results  Component Value Date   HGBA1C 5.5 09/10/2019   Lab Results  Component Value Date   CHOL 133 09/10/2019   HDL 41 09/10/2019   LDLCALC 77 09/10/2019   TRIG 73 09/10/2019   CHOLHDL 3.2 09/10/2019    Significant Diagnostic Results in last 30 days:  No results found.  Assessment/Plan There are no diagnoses linked to this encounter.   Family/ staff  Communication:   Labs/tests ordered:

## 2019-12-10 NOTE — Assessment & Plan Note (Signed)
Stable, continue Mirtazapine.  

## 2019-12-10 NOTE — Assessment & Plan Note (Signed)
Stable, continue Senokot S, MiraLax, Colace 

## 2019-12-10 NOTE — Assessment & Plan Note (Signed)
for reported 12/09/19 the patient was found on the floor in sitting position upright against the w/c, slid down of w/c when the cushion underneath him slid out of the w/c, no apparent injury.  Close supervision/assistance for safety.

## 2019-12-10 NOTE — Assessment & Plan Note (Signed)
Continue  thickened liquids.

## 2019-12-10 NOTE — Assessment & Plan Note (Signed)
s/p CVA left sided weakness, MRI showed right subcortical infarct mostly likely embolic nature.

## 2019-12-10 NOTE — Assessment & Plan Note (Signed)
Heart rate is in control, continue Metoprolol, Eliquis.  

## 2019-12-10 NOTE — Assessment & Plan Note (Signed)
Blood pressure is controlled, continue Metoprolol. 

## 2019-12-10 NOTE — Assessment & Plan Note (Signed)
Continue  Levothyroxine, TSH 3.939 09/12/19

## 2019-12-11 ENCOUNTER — Encounter: Payer: Self-pay | Admitting: Nurse Practitioner

## 2019-12-14 ENCOUNTER — Other Ambulatory Visit: Payer: Self-pay

## 2019-12-14 ENCOUNTER — Ambulatory Visit (HOSPITAL_COMMUNITY)
Admission: RE | Admit: 2019-12-14 | Discharge: 2019-12-14 | Disposition: A | Discharge status: Home / Self Care | Payer: Medicare Other | Source: Ambulatory Visit | Attending: Nurse Practitioner | Admitting: Nurse Practitioner

## 2019-12-14 DIAGNOSIS — R059 Cough, unspecified: Secondary | ICD-10-CM

## 2019-12-14 DIAGNOSIS — R05 Cough: Secondary | ICD-10-CM

## 2019-12-14 DIAGNOSIS — R1310 Dysphagia, unspecified: Secondary | ICD-10-CM | POA: Insufficient documentation

## 2019-12-17 ENCOUNTER — Encounter: Payer: Self-pay | Admitting: Nurse Practitioner

## 2019-12-17 ENCOUNTER — Non-Acute Institutional Stay (SKILLED_NURSING_FACILITY): Payer: Medicare Other | Admitting: Nurse Practitioner

## 2019-12-17 DIAGNOSIS — R531 Weakness: Secondary | ICD-10-CM

## 2019-12-17 DIAGNOSIS — I10 Essential (primary) hypertension: Secondary | ICD-10-CM

## 2019-12-17 DIAGNOSIS — I480 Paroxysmal atrial fibrillation: Secondary | ICD-10-CM

## 2019-12-17 DIAGNOSIS — R634 Abnormal weight loss: Secondary | ICD-10-CM | POA: Diagnosis not present

## 2019-12-17 DIAGNOSIS — E039 Hypothyroidism, unspecified: Secondary | ICD-10-CM | POA: Diagnosis not present

## 2019-12-17 DIAGNOSIS — R279 Unspecified lack of coordination: Secondary | ICD-10-CM | POA: Diagnosis not present

## 2019-12-17 DIAGNOSIS — R1312 Dysphagia, oropharyngeal phase: Secondary | ICD-10-CM | POA: Diagnosis not present

## 2019-12-17 DIAGNOSIS — R2689 Other abnormalities of gait and mobility: Secondary | ICD-10-CM | POA: Diagnosis not present

## 2019-12-17 DIAGNOSIS — M6281 Muscle weakness (generalized): Secondary | ICD-10-CM | POA: Diagnosis not present

## 2019-12-17 DIAGNOSIS — K5901 Slow transit constipation: Secondary | ICD-10-CM | POA: Diagnosis not present

## 2019-12-17 DIAGNOSIS — I69354 Hemiplegia and hemiparesis following cerebral infarction affecting left non-dominant side: Secondary | ICD-10-CM | POA: Diagnosis not present

## 2019-12-17 DIAGNOSIS — G8194 Hemiplegia, unspecified affecting left nondominant side: Secondary | ICD-10-CM | POA: Diagnosis not present

## 2019-12-17 DIAGNOSIS — I639 Cerebral infarction, unspecified: Secondary | ICD-10-CM | POA: Diagnosis not present

## 2019-12-17 NOTE — Assessment & Plan Note (Signed)
S/p CVA left sided weakness, MRI showed right subcortical infarct mostly likely embolic nature.

## 2019-12-17 NOTE — Assessment & Plan Note (Signed)
Mood/appetite/sleep, takes Mirtazapine.weight loss about #16Ibs in the past month. Disliking thickened liquids causes decreased oral intakes. Will update CBC/diff, CMP, TSH. Continue dietary supplements.

## 2019-12-17 NOTE — Assessment & Plan Note (Signed)
Heart rate is in control, continue Eliquis, Metoprolol.

## 2019-12-17 NOTE — Assessment & Plan Note (Signed)
Dysphagia, takes thickened liquids. S/p MBSS, oral intake is limited due to disliking thickened liquids.

## 2019-12-17 NOTE — Assessment & Plan Note (Signed)
Stable, continue Colace, MiraLax, Senokot S

## 2019-12-17 NOTE — Assessment & Plan Note (Signed)
Blood pressure is controlled, continue Metoprolol. 

## 2019-12-17 NOTE — Assessment & Plan Note (Signed)
TSH 3.939 09/12/19

## 2019-12-17 NOTE — Progress Notes (Signed)
Location:    [place] Room Number: 39 Place of Service:  SNF (31) Provider: Lennie Odor Maya Arcand NP  [name], MD  Patient Care Team: [name], MD as PCP - General (Internal Medicine) [name], MD (Ophthalmology) [name], MD (Orthopedic Surgery) [name], MD (Cardiology) [name], MD (General Surgery) [name], MD (Family Medicine)  Extended Emergency Contact Information Primary Emergency Contact: Bogart,[name] Address: [place], [place] [address] [name] of [place] Phone: [phone] Mobile Phone: [phone] Relation: Spouse Secondary Emergency Contact: Rehaan, Viloria Home Phone: [phone] Relation: Spouse  Code Status:  Full Code Goals of care: Advanced Directive information Advanced Directives 10/12/2019  Does Patient Have a Medical Advance Directive? Yes  Type of Paramedic of [place];Living will  Does patient want to make changes to medical advance directive? No - Patient declined  Copy of [place] in Chart? Yes - validated most recent copy scanned in chart (See row information)  Would patient like information on creating a medical advance directive? -     Chief Complaint  Patient presents with  . Medical Management of Chronic Issues    HPI:  Pt is a 86 y.o. male seen today for medical management of chronic diseases.    Dysphagia, takes thickened liquids. S/p MBSS, oral intake is limited due to disliking thickened liquids.  S/p CVA left sided weakness, MRI showed right subcortical infarct mostly likely embolic nature.  HTN, takes Metoprolol qd. Afib, heart rate is in control. Takes Eliquis, Metoprolol.  Constipation, takes Senokot S, MiraLax, Colace.  Hypothyroidism, takes Levothyroxine, TSH 3.939 09/12/19 Mood/appetite/sleep, takes  Mirtazapine.weight loss about #16Ibs in the past month.     Past Medical History:  Diagnosis Date  . AF (atrial fibrillation) [facility])    a. s/p RFCA/PVI @ [facility] clinic 2007.  . Arthritis    "joints" (06/21/2013)  . Atrial flutter ([place])    a. 06/2013 s/p TEE/DCCV  . Chronic systolic CHF (congestive heart failure) ([place])    a. 06/2013 Echo: EF 20%, diff HK, mild LVH.  Marland Kitchen History of cardiovascular stress test    Lexiscan Myoview 8/16:  EF 59%, attenuation artifact, no ischemia; Low Risk  . Hyperlipidemia   . Hypertension   . HYPERTENSION, BENIGN 08/21/2009   Qualifier: Diagnosis of  By: Caryl Comes, MD, Remus Blake  Medications(s)  enalapril   . Hypothyroidism   . Neoplasm of uncertain behavior    SKIN  . Polymyalgia rheumatica ([place]) 01/13/2007   Qualifier: History of  By: Danny Lawless CMA, [place]     . Presence of permanent cardiac pacemaker    Past Surgical History:  Procedure Laterality Date  . ATRIAL FIBRILLATION ABLATION  2007  . CARDIOVERSION N/A 06/22/2013   Procedure: CARDIOVERSION;  Surgeon: Sueanne Margarita, MD;  Location: [facility] ENDOSCOPY;  Service: Cardiovascular;  Laterality: N/A;  15:17 Lido '20mg'$ ,IV , Propofol 30 mg, IV synched cardioversion @ 150 joules by Dr. Lajoyce Lauber from a flutter to sinis brady which is baseline for this pt, reports 45-50 reg HR verified by Dr. Ashok Norris  . CARDIOVERSION N/A 11/21/2014   Procedure: CARDIOVERSION;  Surgeon: Fay Records, MD;  Location: [facility] ENDOSCOPY;  Service: Cardiovascular;  Laterality: N/A;  . CARDIOVERSION N/A 07/14/2015   Procedure: CARDIOVERSION;  Surgeon: Sueanne Margarita, MD;  Location: [place];  Service: Cardiovascular;  Laterality: N/A;  . CATARACT EXTRACTION, BILATERAL Bilateral  Summer 2009  . CHOLECYSTECTOMY    . EP IMPLANTABLE DEVICE N/A 11/10/2015   Procedure: Pacemaker Implant;  Surgeon: [name], MD;  Location: [place] CV LAB;  Service: Cardiovascular;  Laterality: N/A;  . HAND SURGERY Right    "broke bone; grafted  area w/bone from somewhere else"  . HEMORRHOID SURGERY    . INGUINAL HERNIA REPAIR Bilateral   . TEE WITHOUT CARDIOVERSION N/A 06/22/2013   Procedure: TRANSESOPHAGEAL ECHOCARDIOGRAM (TEE);  Surgeon: Sueanne Margarita, MD;  Location[facility] ENDOSCOPY;  Service: Cardiovascular;  Laterality: N/A;  . TEE WITHOUT CARDIOVERSION N/A 07/14/2015   Procedure: TRANSESOPHAGEAL ECHOCARDIOGRAM (TEE);  Surgeon: Sueanne Margarita, MD;  Location: [facility] ENDOSCOPY;  Service: Cardiovascular;  Laterality: N/A;  . TONSILLECTOMY      No Known Allergies  Allergies as of 12/17/2019   No Known Allergies     Medication List       Accurate as of December 17, 2019 11:59 PM. If you have any questions, ask your nurse or doctor.        STOP taking these medications   acetaminophen 325 MG tablet Commonly known as: TYLENOL Stopped by: Jalaina Salyers X Ravleen Ries, NP   Metoprolol Succinate 25 MG Cs24 Stopped by: Dany Walther X Inayah Woodin, NP     TAKE these medications   antiseptic oral rinse Liqd 20 mLs by Mouth Rinse route as needed for dry mouth.   atorvastatin 40 MG tablet Commonly known as: LIPITOR Take 40 mg by mouth daily.   carboxymethylcellulose 0.5 % Soln Commonly known as: REFRESH PLUS 1 drop every 4 (four) hours as needed. Each eye   docusate sodium 100 MG capsule Commonly known as: COLACE Take 200 mg by mouth daily.   Eliquis 5 MG Tabs tablet Generic drug: apixaban Take 5 mg by mouth 2 (two) times daily.   glucosamine-chondroitin 500-400 MG tablet Take 1 tablet by mouth 3 (three) times daily.   levothyroxine 50 MCG tablet Commonly known as: SYNTHROID Take 1 tablet (50 mcg total) by mouth daily before breakfast.   mirtazapine 15 MG tablet Commonly known as: REMERON Take 7.5 mg by mouth at bedtime.   multivitamin with minerals Tabs tablet Take 1 tablet by mouth daily. Centrum   polyethylene glycol 17 g packet Commonly known as: MIRALAX / GLYCOLAX Take 17 g by mouth daily. 17 gram/dose, oral, Once A Morning, Miralax 17 gm  PO QD. Hold for diarrhea.   RESOURCE 2.0 PO Take by mouth in the morning, at noon, and at bedtime.   sennosides-docusate sodium 8.6-50 MG tablet Commonly known as: SENOKOT-S Take 2 tablets by mouth daily.       Review of Systems  Constitutional: Positive for unexpected weight change. Negative for fatigue and fever.       Weight loss about #16Ibs in the past month.   HENT: Positive for hearing loss and trouble swallowing. Negative for congestion and voice change.   Eyes: Negative for visual disturbance.  Respiratory: Positive for cough. Negative for chest tightness, shortness of breath and wheezing.        Phlegm   Cardiovascular: Negative for chest pain, palpitations and leg swelling.  Gastrointestinal: Negative for abdominal pain, constipation, nausea and vomiting.  Genitourinary: Positive for frequency. Negative for dysuria and urgency.  Musculoskeletal: Positive for gait problem.  Skin: Negative for color change.  Neurological: Positive for facial asymmetry and weakness. Negative for speech difficulty and light-headedness.  Psychiatric/Behavioral: Negative for behavioral problems and sleep disturbance. The patient is not nervous/anxious.  Immunization History  Administered Date(s) Administered  . Influenza Split 01/25/2012  . Influenza Whole 02/08/2008, 03/12/2009, 01/16/2010  . Influenza, High Dose Seasonal PF 01/24/2015, 03/04/2017, 02/16/2018, 12/29/2018  . Influenza,inj,Quad PF,6+ Mos 01/25/2013, 01/23/2014  . Influenza-Unspecified 01/02/2016  . PFIZER SARS-COV-2 Vaccination 05/22/2019, 06/12/2019  . Pneumococcal Conjugate-13 01/25/2013  . Pneumococcal Polysaccharide-23 05/28/2016  . Td 01/16/2010  . Tdap 05/06/2019   Pertinent  Health Maintenance Due  Topic Date Due  . INFLUENZA VACCINE  12/02/2019  . PNA vac Low Risk Adult  Completed   Fall Risk  03/23/2019 03/17/2018 02/16/2018 05/28/2016 04/08/2016  Falls in the past year? 0 1 No No No  Comment Emmi  Telephone Survey: data to providers prior to load [employer] Telephone Survey: data to providers prior to load - - Emmi Telephone Survey: data to providers prior to load  Number falls in past yr: - 1 - - -  Comment - Emmi Telephone Survey Actual Response = 2 - - -  Injury with Fall? - 1 - - -   Functional Status Survey:    Vitals:   12/17/19 1525  BP: (!) 148/86  Pulse: 73  Resp: 20  Temp: (!) 97.4 F (36.3 C)  SpO2: 97%  Weight: 126 lb 8 oz (57.4 kg)  Height: _0  (1.753 m)   Body mass index is 18.68 kg/m. Physical Exam Vitals and nursing note reviewed.  Constitutional:      Appearance: Normal appearance.  HENT:     Head: Normocephalic and atraumatic.     Mouth/Throat:     Mouth: Mucous membranes are moist.  Eyes:     Extraocular Movements: Extraocular movements intact.     Conjunctiva/sclera: Conjunctivae normal.     Pupils: Pupils are equal, round, and reactive to light.  Cardiovascular:     Rate and Rhythm: Normal rate and regular rhythm.     Heart sounds: No murmur heard.      Comments: Left chest pace maker Pulmonary:     Effort: Pulmonary effort is normal.     Breath sounds: Rales present. No wheezing or rhonchi.     Comments: Mostly left mid to lower lungs with deep breathing Abdominal:     General: Bowel sounds are normal.     Palpations: Abdomen is soft.     Tenderness: There is no abdominal tenderness.  Musculoskeletal:     Cervical back: Normal range of motion and neck supple.     Right lower leg: No edema.     Left lower leg: No edema.  Skin:    Coloration: Skin is not jaundiced or pale.  Neurological:     Mental Status: He is alert and oriented to person, place, and time. Mental status is at baseline.     Cranial Nerves: Cranial nerve deficit present.     Motor: Weakness present.     Coordination: Coordination abnormal.     Gait: Gait abnormal.     Comments: LUE, LLE weakness with muscle strength 5/5  Psychiatric:        Mood and Affect: Mood  normal.        Behavior: Behavior normal.        Thought Content: Thought content normal.        Judgment: Judgment normal.     Labs reviewed: Recent Labs    09/09/19 1138 09/10/19 0621 09/12/19 0237 09/12/19 0237 09/13/19 0454 09/14/19 0301 09/27/19 0000  NA 141   < > 140   < > 140 141 142  K 4.5   < > 4.2   < > 3.9 4.2 4.1  CL 104   < > 103   < > 106 107 108  CO2 28   < > 26   < > 26 25 26*  GLUCOSE 104*   < > 104*  --  109* 118*  --   BUN 16   < > 28*   < > 33* 33* 33*  CREATININE 0.98   < > 1.17   < > 1.26* 1.14 1.0  CALCIUM 9.3   < > 8.9   < > 8.8* 8.9 9.2  MG 2.0  --   --   --   --   --   --    < > = values in this interval not displayed.   Recent Labs    05/07/19 0512 09/09/19 1138 09/27/19 0000  AST _0 ALT 8 10 6*  ALKPHOS 59 72 84  BILITOT 1.3* 1.0  --   PROT 6.0* 6.6  --   ALBUMIN 3.4* 3.8 3.9   Recent Labs    05/06/19 1320 05/07/19 0512 09/09/19 1138 09/10/19 0621 09/12/19 0237 09/12/19 0237 09/13/19 0454 09/14/19 0301 09/27/19 0000  WBC 7.8   < > 4.2   < > 6.2   < > 5.6 5.4 6.2  NEUTROABS 6.5  --  3.0  --   --   --   --   --  4,700  HGB 13.6   < > 13.4   < > 13.1   < > 12.6* 12.9* 13.8  HCT 42.6   < > 42.2   < > 40.5   < > 38.4* 39.3 41  MCV 96.4   < > 95.3   < > 93.3  --  93.9 94.0  --   PLT 155   < > 134*   < > 124*   < > 128* 133* 186   < > = values in this interval not displayed.   Lab Results  Component Value Date   TSH 3.939 09/12/2019   Lab Results  Component Value Date   HGBA1C 5.5 09/10/2019   Lab Results  Component Value Date   CHOL 133 09/10/2019   HDL 41 09/10/2019   LDLCALC 77 09/10/2019   TRIG 73 09/10/2019   CHOLHDL 3.2 09/10/2019    Significant Diagnostic Results in last 30 days:  Leanne Chang OP MEDICARE SPEECH PATH  Result Date: 12/14/2019 Objective Swallowing Evaluation: Type of Study: MBS-Modified Barium Swallow Study  Patient Details Name: CLETUS PARIS MRN: [id number] Date of Birth: Aug 06, 1925  Today's Date: 12/14/2019 Time: SLP Start Time (ACUTE ONLY): 1315 -SLP Stop Time (ACUTE ONLY): 1401 SLP Time Calculation (min) (ACUTE ONLY): 46 min Past Medical History: Past Medical History: Diagnosis Date . AF (atrial fibrillation) [facility])   a. s/p RFCA/PVI @ [facility] clinic 2007. . Arthritis   "joints" (06/21/2013) . Atrial flutter ([place])   a. 06/2013 s/p TEE/DCCV . Chronic systolic CHF (congestive heart failure) ([place])   a. 06/2013 Echo: EF 20%, diff HK, mild LVH. Marland Kitchen History of cardiovascular stress test   Lexiscan Myoview 8/16:  EF 59%, attenuation artifact, no ischemia; Low Risk . Hyperlipidemia  . Hypertension  . HYPERTENSION, BENIGN 08/21/2009  Qualifier: Diagnosis of  By: Caryl Comes, MD, Remus Blake  Medications(s)  enalapril  . Hypothyroidism  . Neoplasm of uncertain behavior   SKIN . Polymyalgia rheumatica ([place]) 01/13/2007  Qualifier: History of  By: Danny Lawless CMA,  [place]    . Presence of permanent cardiac pacemaker  Past Surgical History: Past Surgical History: Procedure Laterality Date . ATRIAL FIBRILLATION ABLATION  2007 . CARDIOVERSION N/A 06/22/2013  Procedure: CARDIOVERSION;  Surgeon: Sueanne Margarita, MD;  [facility]: [facility] ENDOSCOPY;  Service: Cardiovascular;  Laterality: N/A;  15:17 Lido 35m,IV , Propofol 30 mg, IV synched cardioversion @ 150 joules by Dr. TLajoyce Lauberfrom a flutter to sinis brady which is baseline for this pt, reports 45-50 reg HR verified by Dr. TAshok Norris. CARDIOVERSION N/A 11/21/2014  Procedure: CARDIOVERSION;  Surgeon: PFay Records MD;  [facility];  Service: Cardiovascular;  Laterality: N/A; . CARDIOVERSION N/A 07/14/2015  Procedure: CARDIOVERSION;  Surgeon: TSueanne Margarita MD;  [place]  Service: Cardiovascular;  Laterality: N/A; . CATARACT EXTRACTION, BILATERAL Bilateral Summer 2009 . CHOLECYSTECTOMY   . EP IMPLANTABLE DEVICE N/A 11/10/2015  Procedure: Pacemaker Implant;  Surgeon: SDeboraha Sprang MD;  [facility];  Service: Cardiovascular;  Laterality:  N/A; . HAND SURGERY Right   "broke bone; grafted area w/bone from somewhere else" . HEMORRHOID SURGERY   . INGUINAL HERNIA REPAIR Bilateral  . TEE WITHOUT CARDIOVERSION N/A 06/22/2013  Procedure: TRANSESOPHAGEAL ECHOCARDIOGRAM (TEE);  Surgeon: TSueanne Margarita MD;  [facility];  Service: Cardiovascular;  Laterality: N/A; . TEE WITHOUT CARDIOVERSION N/A 07/14/2015  Procedure: TRANSESOPHAGEAL ECHOCARDIOGRAM (TEE);  Surgeon: TSueanne Margarita MD;  [facility];  Service: Cardiovascular;  Laterality: N/A; . TONSILLECTOMY   HPI: HPI[facility] 86[facility], [facility] with concern for acute CVA.  Pt CT scan head showed chronic lacunar CVAs x2 basal ganglia, small right cerebellar CVA.  MRI showed insular CVA. Concern for worsening symptoms, so new CT has been ordered to r/o extension.  Pt PMH + for HTN, hypothyroidism.    Pt with recent pna in July 2021.  He is being referred for repeat MBS to determine Least restrictive diet and to objectively evaluate swallowing.   Subjective: pt awake in chair Assessment / Plan / Recommendation CHL IP CLINICAL IMPRESSIONS 12/14/2019 Clinical Impression Patient presents with ongoing moderate oropharyngeal dysphagia with aspiration of thin liquids due to decreased timing of and adequacy of laryngeal elevation/closure.  Aspiration was sensed with reflexive cough noted but cough did not clear aspirates.  Oral control of barium was decreased due to pt's lingual weakness which results in premature spillage and decreased propulsion.  Piecemealing noted intermittently which was marginally delayed with pudding/nectar.   Pharyngeal swallow was inconsistent in performance, at times stronger and more efficient than others.  With cues for "fast and hard swallow" and chin tuck posture, pt able to protect his airway with nectar, pudding, cracker.   However despite use of chin tuck, pt still aspirated with thin when retained pudding barium noted in pharynx.   Single tsps thin with head neutral were not aspirated.  Of note, if pt could elicit swallow timing before head of bolus reached over epiglottis or pyriform sinus, he did not aspirate with thin. As, penetration and aspiration of thin occur due to decreased timing and adequacy of laryngeal closure.   Thin barium pooling at pyriform sinus before swallow triggered spilled into larynx/trachea with swallow initiation.  On the 15th and final bolus of the test *nectar*, pt noted to have swallow trigger at pyriform sinus at 2 seconds- oral residuals/piecemealed nectar bolus triggered at SEVEN secondary at pyriform sinus with pt needing verbal cues to swallow.  Suspect initiation difficulties  are likely from his right frontal lobe cva impacting initiation and cerebellar/basal ganglia CVA contribute to dysphagia.  Pt does present with cough with all aspiration episodes, thus advancing diet clinically may be feasible.  Pt verbalized to this SLP that he "does not like nor want to use the thickened drinks" and he further states "I've had pneumonia anyway" indicating this to be true despite following dietary restrictions.   Aspiration mitigation strategies in place but given pt's inconsisent swallow performance, barium retention and comorbidities, he will be chronic aspiration/aspiration pna risk.  Given pt did not aspirate with thin via tsp, provided him with a Provale cup to use for thins.  Advise addressing pt's wishes for dietary advancement with MDs, NPs, family and pt especially given his advanced age, comorbidities and wishes.  Recommend continue postures of chin tuck and effortful swallows.  Strengthening tongue base retraction and laryngeal elevation advised.  Pt informed to maintain strength of "hock" to clear pharynx/valleculae and cough for airway clearance. He reported understanding to test results/compensation strategies and use of Provale cup.  Pt also would benefit from [employer] Protocol to help him maximize  hydration and give him QOL.  Thanks for this consult.   SLP Visit Diagnosis Dysphagia, oropharyngeal phase (R13.12) Attention and concentration deficit following -- Frontal lobe and executive function deficit following -- Impact on safety and function Mild aspiration risk;Moderate aspiration risk;Risk for inadequate nutrition/hydration   CHL IP TREATMENT RECOMMENDATION 09/10/2019 Treatment Recommendations Defer until completion of intrumental exam   Prognosis 09/12/2019 Prognosis for Safe Diet Advancement Fair Barriers to Reach Goals Severity of deficits Barriers/Prognosis Comment -- CHL IP DIET RECOMMENDATION 12/14/2019 SLP Diet Recommendations Dysphagia 3 (Mech soft) solids;Nectar thick liquid;Thin liquid Liquid Administration via Cup;Straw;Other (Comment) Medication Administration Other (Comment) crushed if large Compensations Slow rate;Small sips/bites;Follow solids with liquid;Effortful swallow;Chin tuck;Use straw to facilitate chin tuck, intermittent dry swallow and intermittent "hock" and swallow or expectorate Postural Changes Remain semi-upright after after feeds/meals (Comment);Seated upright at 90 degrees   CHL IP OTHER RECOMMENDATIONS 12/14/2019 Recommended Consults (No Data) Oral Care Recommendations -- Other Recommendations --   CHL IP FOLLOW UP RECOMMENDATIONS 09/13/2019 Follow up Recommendations Skilled Nursing facility   [facility] IP FREQUENCY AND DURATION 09/12/2019 Speech Therapy Frequency (ACUTE ONLY) min 2x/week Treatment Duration 1 week      CHL IP ORAL PHASE 12/14/2019 Oral Phase Impaired Oral - Pudding Teaspoon -- Oral - Pudding Cup -- Oral - Honey Teaspoon -- Oral - Honey Cup -- Oral - Nectar Teaspoon Weak lingual manipulation;Reduced posterior propulsion;Decreased bolus cohesion;Premature spillage Oral - Nectar Cup Weak lingual manipulation;Reduced posterior propulsion;Piecemeal swallowing;Decreased bolus cohesion;Premature spillage Oral - Nectar Straw Weak lingual manipulation;Reduced posterior  propulsion;Piecemeal swallowing;Decreased bolus cohesion;Premature spillage Oral - Thin Teaspoon Weak lingual manipulation;Reduced posterior propulsion;Premature spillage Oral - Thin Cup Weak lingual manipulation;Reduced posterior propulsion;Piecemeal swallowing;Decreased bolus cohesion;Premature spillage Oral - Thin Straw Weak lingual manipulation;Reduced posterior propulsion;Piecemeal swallowing;Decreased bolus cohesion;Premature spillage Oral - Puree Weak lingual manipulation;Reduced posterior propulsion Oral - Mech Soft Impaired mastication;Weak lingual manipulation;Reduced posterior propulsion Oral - Regular -- Oral - Multi-Consistency -- Oral - Pill -- Oral Phase - Comment --  CHL IP PHARYNGEAL PHASE 12/14/2019 Pharyngeal Phase Impaired Pharyngeal- Pudding Teaspoon -- Pharyngeal -- Pharyngeal- Pudding Cup -- Pharyngeal -- Pharyngeal- Honey Teaspoon -- Pharyngeal -- Pharyngeal- Honey Cup -- Pharyngeal -- Pharyngeal- Nectar Teaspoon Delayed swallow initiation-pyriform sinuses Pharyngeal Material does not enter airway Pharyngeal- Nectar Cup Reduced epiglottic inversion;Reduced tongue base retraction;Pharyngeal residue - valleculae Pharyngeal Material does not enter airway Pharyngeal-  Nectar Straw -- Pharyngeal -- Pharyngeal- Thin Teaspoon Delayed swallow initiation-vallecula;Reduced epiglottic inversion;Reduced laryngeal elevation;Reduced airway/laryngeal closure;Reduced tongue base retraction;Pharyngeal residue - valleculae Pharyngeal -- Pharyngeal- Thin Cup Reduced epiglottic inversion;Reduced laryngeal elevation;Reduced airway/laryngeal closure;Reduced tongue base retraction;Penetration/Aspiration during swallow;Pharyngeal residue - valleculae Pharyngeal Material enters airway, remains ABOVE vocal cords and not ejected out;Material enters airway, passes BELOW cords and not ejected out despite cough attempt by patient Pharyngeal- Thin Straw Delayed swallow initiation-vallecula;Reduced epiglottic  inversion;Reduced laryngeal elevation;Pharyngeal residue - valleculae;Compensatory strategies attempted (with notebox) Pharyngeal -- Pharyngeal- Puree Delayed swallow initiation-vallecula;Reduced pharyngeal peristalsis;Reduced tongue base retraction;Pharyngeal residue - valleculae;Lateral channel residue Pharyngeal -- Pharyngeal- Mechanical Soft Delayed swallow initiation-vallecula;Reduced tongue base retraction;Pharyngeal residue - valleculae Pharyngeal -- Pharyngeal- Regular -- Pharyngeal -- Pharyngeal- Multi-consistency -- Pharyngeal -- Pharyngeal- Pill -- Pharyngeal -- Pharyngeal Comment --  CHL IP CERVICAL ESOPHAGEAL PHASE 12/14/2019 Cervical Esophageal Phase Impaired Pudding Teaspoon -- Pudding Cup -- Honey Teaspoon -- Honey Cup -- Nectar Teaspoon -- Nectar Cup -- Nectar Straw -- Thin Teaspoon -- Thin Cup -- Thin Straw -- Puree -- Mechanical Soft -- Regular -- Multi-consistency -- Pill -- Cervical Esophageal Comment -- Kathleen Lime, MS [facility] SLP Acute Rehab Services Office [phone] [name] 12/14/2019, 3:17 PM  CLINICAL DATA:  Dysphagia. Recent pneumonia despite intake restrictions. EXAM: MODIFIED BARIUM SWALLOW TECHNIQUE: Different consistencies of barium were administered orally to the patient by the Speech Pathologist. Imaging of the pharynx was performed in the lateral projection. The radiologist was present in the fluoroscopy room for this study, providing personal supervision. FLUOROSCOPY TIME:  Fluoroscopy Time:  1 minutes 48 seconds Radiation Exposure Index (if provided by the fluoroscopic device): 3.1 mGy Number of Acquired Spot Images: 0 COMPARISON:  None. FINDINGS: With thin barium, there was aspiration into the cervical trachea with a cough reflex. This improved with the chin tucked and did not occur when only a teaspoon of thin liquid was swallowed at a time. There was also mild vallecular pooling. There was flash laryngeal penetration and mild vallecular pooling with nectar. Vallecular  pooling was demonstrated with puree. This did not clear with subsequent swallows of nectar. Vallecular pooling was demonstrated with cracker. IMPRESSION: 1. Aspiration with thin liquid with a cough reflex. This improved with the chin tucked and did not occur when only a teaspoon of thin liquid was swallowed at a time. 2. Flash laryngeal penetration and mild vallecular pooling with nectar. 3. Vallecular pulling with puree and cracker. Please refer to the Speech Pathologists report for complete details and recommendations. Electronically Signed   By: Claudie Revering M.D.   On: 12/14/2019 14:11    Assessment/Plan Weight loss Mood/appetite/sleep, takes Mirtazapine.weight loss about #16Ibs in the past month. Disliking thickened liquids causes decreased oral intakes. Will update CBC/diff, CMP, TSH. Continue dietary supplements.    Hypothyroidism TSH 3.939 09/12/19  Slow transit constipation Stable, continue Colace, MiraLax, Senokot S  AF (paroxysmal atrial fibrillation) (HCC) Heart rate is in control, continue Eliquis, Metoprolol.   Essential hypertension Blood pressure is controlled, continue Metoprolol.   Weakness of left side of body S/p CVA left sided weakness, MRI showed right subcortical infarct mostly likely embolic nature.    Dysphagia Dysphagia, takes thickened liquids. S/p MBSS, oral intake is limited due to disliking thickened liquids.     Family/ staff Communication: plan of care reviewed with the patient and charge nurse.   Labs/tests ordered: CBC/diff, CMP/eGFR, TSH   Time spend 40 minutes.

## 2019-12-18 ENCOUNTER — Encounter: Payer: Self-pay | Admitting: Nurse Practitioner

## 2019-12-18 DIAGNOSIS — M6281 Muscle weakness (generalized): Secondary | ICD-10-CM | POA: Diagnosis not present

## 2019-12-18 DIAGNOSIS — R2689 Other abnormalities of gait and mobility: Secondary | ICD-10-CM | POA: Diagnosis not present

## 2019-12-18 DIAGNOSIS — R279 Unspecified lack of coordination: Secondary | ICD-10-CM | POA: Diagnosis not present

## 2019-12-18 DIAGNOSIS — R1312 Dysphagia, oropharyngeal phase: Secondary | ICD-10-CM | POA: Diagnosis not present

## 2019-12-18 DIAGNOSIS — I69354 Hemiplegia and hemiparesis following cerebral infarction affecting left non-dominant side: Secondary | ICD-10-CM | POA: Diagnosis not present

## 2019-12-18 DIAGNOSIS — I639 Cerebral infarction, unspecified: Secondary | ICD-10-CM | POA: Diagnosis not present

## 2019-12-19 DIAGNOSIS — I639 Cerebral infarction, unspecified: Secondary | ICD-10-CM | POA: Diagnosis not present

## 2019-12-19 DIAGNOSIS — R279 Unspecified lack of coordination: Secondary | ICD-10-CM | POA: Diagnosis not present

## 2019-12-19 DIAGNOSIS — R1312 Dysphagia, oropharyngeal phase: Secondary | ICD-10-CM | POA: Diagnosis not present

## 2019-12-19 DIAGNOSIS — I69354 Hemiplegia and hemiparesis following cerebral infarction affecting left non-dominant side: Secondary | ICD-10-CM | POA: Diagnosis not present

## 2019-12-19 DIAGNOSIS — R2689 Other abnormalities of gait and mobility: Secondary | ICD-10-CM | POA: Diagnosis not present

## 2019-12-19 DIAGNOSIS — M6281 Muscle weakness (generalized): Secondary | ICD-10-CM | POA: Diagnosis not present

## 2019-12-20 DIAGNOSIS — Z13228 Encounter for screening for other metabolic disorders: Secondary | ICD-10-CM | POA: Diagnosis not present

## 2019-12-20 DIAGNOSIS — R946 Abnormal results of thyroid function studies: Secondary | ICD-10-CM | POA: Diagnosis not present

## 2019-12-20 DIAGNOSIS — R279 Unspecified lack of coordination: Secondary | ICD-10-CM | POA: Diagnosis not present

## 2019-12-20 DIAGNOSIS — I69354 Hemiplegia and hemiparesis following cerebral infarction affecting left non-dominant side: Secondary | ICD-10-CM | POA: Diagnosis not present

## 2019-12-20 DIAGNOSIS — R1312 Dysphagia, oropharyngeal phase: Secondary | ICD-10-CM | POA: Diagnosis not present

## 2019-12-20 DIAGNOSIS — I639 Cerebral infarction, unspecified: Secondary | ICD-10-CM | POA: Diagnosis not present

## 2019-12-20 DIAGNOSIS — R6889 Other general symptoms and signs: Secondary | ICD-10-CM | POA: Diagnosis not present

## 2019-12-20 DIAGNOSIS — M6281 Muscle weakness (generalized): Secondary | ICD-10-CM | POA: Diagnosis not present

## 2019-12-20 DIAGNOSIS — R2689 Other abnormalities of gait and mobility: Secondary | ICD-10-CM | POA: Diagnosis not present

## 2019-12-20 LAB — CBC AND DIFFERENTIAL
HCT: 25 — AB (ref 41–53)
Hemoglobin: 8.1 — AB (ref 13.5–17.5)
Neutrophils Absolute: 4554
Platelets: 174 (ref 150–399)
WBC: 5.5

## 2019-12-20 LAB — BASIC METABOLIC PANEL
BUN: 16 (ref 4–21)
CO2: 27 — AB (ref 13–22)
Chloride: 103 (ref 99–108)
Creatinine: 0.6 (ref 0.6–1.3)
Glucose: 81
Potassium: 4 (ref 3.4–5.3)
Sodium: 139 (ref 137–147)

## 2019-12-20 LAB — COMPREHENSIVE METABOLIC PANEL
Albumin: 2.5 — AB (ref 3.5–5.0)
Calcium: 8.2 — AB (ref 8.7–10.7)
GFR calc Af Amer: 99
GFR calc non Af Amer: 86
Globulin: 3.2

## 2019-12-20 LAB — HEPATIC FUNCTION PANEL
ALT: 10 (ref 10–40)
AST: 24 (ref 14–40)
Alkaline Phosphatase: 72 (ref 25–125)
Bilirubin, Total: 0.6

## 2019-12-20 LAB — TSH: TSH: 2.5 (ref 0.41–5.90)

## 2019-12-20 LAB — CBC: RBC: 2.78 — AB (ref 3.87–5.11)

## 2019-12-21 ENCOUNTER — Other Ambulatory Visit: Payer: Self-pay

## 2019-12-21 ENCOUNTER — Non-Acute Institutional Stay (SKILLED_NURSING_FACILITY): Payer: Medicare Other | Admitting: Nurse Practitioner

## 2019-12-21 ENCOUNTER — Encounter: Payer: Self-pay | Admitting: Nurse Practitioner

## 2019-12-21 ENCOUNTER — Emergency Department (HOSPITAL_COMMUNITY): Payer: Medicare Other

## 2019-12-21 ENCOUNTER — Encounter (HOSPITAL_COMMUNITY): Payer: Self-pay | Admitting: Emergency Medicine

## 2019-12-21 ENCOUNTER — Emergency Department (HOSPITAL_COMMUNITY)
Admission: EM | Admit: 2019-12-21 | Discharge: 2019-12-22 | Disposition: A | Discharge status: Other Acute Inpatient Hospital | Payer: Medicare Other | Attending: Emergency Medicine | Admitting: Emergency Medicine

## 2019-12-21 DIAGNOSIS — I5022 Chronic systolic (congestive) heart failure: Secondary | ICD-10-CM | POA: Diagnosis not present

## 2019-12-21 DIAGNOSIS — I639 Cerebral infarction, unspecified: Secondary | ICD-10-CM | POA: Diagnosis not present

## 2019-12-21 DIAGNOSIS — Z7901 Long term (current) use of anticoagulants: Secondary | ICD-10-CM | POA: Diagnosis not present

## 2019-12-21 DIAGNOSIS — I130 Hypertensive heart and chronic kidney disease with heart failure and stage 1 through stage 4 chronic kidney disease, or unspecified chronic kidney disease: Secondary | ICD-10-CM | POA: Diagnosis not present

## 2019-12-21 DIAGNOSIS — E039 Hypothyroidism, unspecified: Secondary | ICD-10-CM | POA: Diagnosis not present

## 2019-12-21 DIAGNOSIS — R279 Unspecified lack of coordination: Secondary | ICD-10-CM | POA: Diagnosis not present

## 2019-12-21 DIAGNOSIS — J181 Lobar pneumonia, unspecified organism: Secondary | ICD-10-CM | POA: Insufficient documentation

## 2019-12-21 DIAGNOSIS — D509 Iron deficiency anemia, unspecified: Secondary | ICD-10-CM | POA: Insufficient documentation

## 2019-12-21 DIAGNOSIS — Z87891 Personal history of nicotine dependence: Secondary | ICD-10-CM | POA: Insufficient documentation

## 2019-12-21 DIAGNOSIS — N183 Chronic kidney disease, stage 3 unspecified: Secondary | ICD-10-CM | POA: Diagnosis not present

## 2019-12-21 DIAGNOSIS — D500 Iron deficiency anemia secondary to blood loss (chronic): Secondary | ICD-10-CM

## 2019-12-21 DIAGNOSIS — R05 Cough: Secondary | ICD-10-CM | POA: Diagnosis not present

## 2019-12-21 DIAGNOSIS — I63112 Cerebral infarction due to embolism of left vertebral artery: Secondary | ICD-10-CM | POA: Diagnosis not present

## 2019-12-21 DIAGNOSIS — J189 Pneumonia, unspecified organism: Secondary | ICD-10-CM | POA: Diagnosis not present

## 2019-12-21 DIAGNOSIS — J90 Pleural effusion, not elsewhere classified: Secondary | ICD-10-CM | POA: Diagnosis not present

## 2019-12-21 DIAGNOSIS — M6281 Muscle weakness (generalized): Secondary | ICD-10-CM | POA: Diagnosis not present

## 2019-12-21 DIAGNOSIS — Z20822 Contact with and (suspected) exposure to covid-19: Secondary | ICD-10-CM | POA: Insufficient documentation

## 2019-12-21 DIAGNOSIS — K5901 Slow transit constipation: Secondary | ICD-10-CM | POA: Diagnosis not present

## 2019-12-21 DIAGNOSIS — I69354 Hemiplegia and hemiparesis following cerebral infarction affecting left non-dominant side: Secondary | ICD-10-CM | POA: Diagnosis not present

## 2019-12-21 DIAGNOSIS — D649 Anemia, unspecified: Secondary | ICD-10-CM | POA: Diagnosis present

## 2019-12-21 DIAGNOSIS — R1312 Dysphagia, oropharyngeal phase: Secondary | ICD-10-CM

## 2019-12-21 DIAGNOSIS — I10 Essential (primary) hypertension: Secondary | ICD-10-CM

## 2019-12-21 DIAGNOSIS — D508 Other iron deficiency anemias: Secondary | ICD-10-CM | POA: Diagnosis not present

## 2019-12-21 DIAGNOSIS — F339 Major depressive disorder, recurrent, unspecified: Secondary | ICD-10-CM

## 2019-12-21 DIAGNOSIS — I4891 Unspecified atrial fibrillation: Secondary | ICD-10-CM

## 2019-12-21 DIAGNOSIS — Z79899 Other long term (current) drug therapy: Secondary | ICD-10-CM | POA: Insufficient documentation

## 2019-12-21 DIAGNOSIS — R2689 Other abnormalities of gait and mobility: Secondary | ICD-10-CM | POA: Diagnosis not present

## 2019-12-21 LAB — COMPREHENSIVE METABOLIC PANEL
ALT: 13 U/L (ref 0–44)
AST: 31 U/L (ref 15–41)
Albumin: 2.5 g/dL — ABNORMAL LOW (ref 3.5–5.0)
Alkaline Phosphatase: 75 U/L (ref 38–126)
Anion gap: 9 (ref 5–15)
BUN: 17 mg/dL (ref 8–23)
CO2: 27 mmol/L (ref 22–32)
Calcium: 8.5 mg/dL — ABNORMAL LOW (ref 8.9–10.3)
Chloride: 102 mmol/L (ref 98–111)
Creatinine, Ser: 0.68 mg/dL (ref 0.61–1.24)
GFR calc Af Amer: >60 mL/min (ref >60)
GFR calc non Af Amer: >60 mL/min (ref >60)
Glucose, Bld: 104 mg/dL — ABNORMAL HIGH (ref 70–99)
Potassium: 4 mmol/L (ref 3.5–5.1)
Sodium: 138 mmol/L (ref 135–145)
Total Bilirubin: 0.7 mg/dL (ref 0.3–1.2)
Total Protein: 7.3 g/dL (ref 6.5–8.1)

## 2019-12-21 LAB — POC OCCULT BLOOD, ED: Fecal Occult Bld: NEGATIVE

## 2019-12-21 LAB — CBC WITH DIFFERENTIAL/PLATELET
Abs Immature Granulocytes: 0.03 10*3/uL (ref 0.00–0.07)
Basophils Absolute: 0 10*3/uL (ref 0.0–0.1)
Basophils Relative: 0 %
Eosinophils Absolute: 0 10*3/uL (ref 0.0–0.5)
Eosinophils Relative: 0 %
HCT: 27.5 % — ABNORMAL LOW (ref 39.0–52.0)
Hemoglobin: 8.4 g/dL — ABNORMAL LOW (ref 13.0–17.0)
Immature Granulocytes: 0 %
Lymphocytes Relative: 7 %
Lymphs Abs: 0.5 10*3/uL — ABNORMAL LOW (ref 0.7–4.0)
MCH: 28.5 pg (ref 26.0–34.0)
MCHC: 30.5 g/dL (ref 30.0–36.0)
MCV: 93.2 fL (ref 80.0–100.0)
Monocytes Absolute: 0.5 10*3/uL (ref 0.1–1.0)
Monocytes Relative: 7 %
Neutro Abs: 6.2 10*3/uL (ref 1.7–7.7)
Neutrophils Relative %: 86 %
Platelets: 218 10*3/uL (ref 150–400)
RBC: 2.95 MIL/uL — ABNORMAL LOW (ref 4.22–5.81)
RDW: 15.8 % — ABNORMAL HIGH (ref 11.5–15.5)
WBC: 7.3 10*3/uL (ref 4.0–10.5)
nRBC: 0 % (ref 0.0–0.2)

## 2019-12-21 LAB — IRON AND TIBC
Iron: 19 ug/dL — ABNORMAL LOW (ref 45–182)
Saturation Ratios: 13 % — ABNORMAL LOW (ref 17.9–39.5)
TIBC: 143 ug/dL — ABNORMAL LOW (ref 250–450)
UIBC: 124 ug/dL

## 2019-12-21 LAB — RETICULOCYTES
Immature Retic Fract: 18.1 % — ABNORMAL HIGH (ref 2.3–15.9)
RBC.: 2.93 MIL/uL — ABNORMAL LOW (ref 4.22–5.81)
Retic Count, Absolute: 40.1 10*3/uL (ref 19.0–186.0)
Retic Ct Pct: 1.4 % (ref 0.4–3.1)

## 2019-12-21 LAB — SARS CORONAVIRUS 2 BY RT PCR (HOSPITAL ORDER, PERFORMED IN ~~LOC~~ HOSPITAL LAB): SARS Coronavirus 2: NEGATIVE

## 2019-12-21 LAB — VITAMIN B12: Vitamin B-12: 492 pg/mL (ref 180–914)

## 2019-12-21 LAB — FERRITIN: Ferritin: 846 ng/mL — ABNORMAL HIGH (ref 24–336)

## 2019-12-21 LAB — FOLATE: Folate: 33.2 ng/mL (ref >5.9)

## 2019-12-21 MED ORDER — DOXYCYCLINE HYCLATE 100 MG PO CAPS: 100.0000 mg | ORAL_CAPSULE | Freq: Two times a day (BID) | ORAL | 0 refills | Status: DC

## 2019-12-21 MED ORDER — AMOXICILLIN-POT CLAVULANATE 875-125 MG PO TABS: 1.0000 | ORAL_TABLET | Freq: Two times a day (BID) | ORAL | 0 refills | Status: DC

## 2019-12-21 MED ORDER — AMOXICILLIN-POT CLAVULANATE 875-125 MG PO TABS
1.0000 | ORAL_TABLET | Freq: Once | ORAL | Status: AC
  Administered 2019-12-21: 1 via ORAL
  Filled 2019-12-21: qty 1

## 2019-12-21 MED ORDER — DOXYCYCLINE HYCLATE 100 MG PO TABS
100.0000 mg | ORAL_TABLET | Freq: Once | ORAL | Status: AC
  Administered 2019-12-21: 100 mg via ORAL
  Filled 2019-12-21: qty 1

## 2019-12-21 NOTE — ED Provider Notes (Signed)
[facility] DEPT Provider Note   CSN: [id number] Arrival date & time: 12/21/19  1644     History Chief Complaint  Patient presents with  . Fatigue    [name] is a 86 y.o. male with hx of A.fib on Eliquis, CHF, HTN who presents with worsening anemia.  Pt resides at [facility]. Pt states he is here because of a "stroke, pneumonia, and dry mouth". He also has been weaker and not eating or drinking as much in the past one week. He saw his primary care provider on 8/16 for management of chronic diseases. Labs resulted yesterday showed a drop in his hgb from 13.8 in May to 8.1 yesterday. He was sent to the ED to have labs and anemia panel drawn. Pt denies any blood in the stool or abdominal pain. He was treated for pneumonia in July on 7/27 with Levaquin and Mucinex. His last colonoscopy was in 2008. He is DNR.    HPI     Past Medical History:  Diagnosis Date  . AF (atrial fibrillation) [facility])    a. s/p RFCA/PVI @ [facility] clinic 2007.  . Arthritis    "joints" (06/21/2013)  . Atrial flutter ([place])    a. 06/2013 s/p TEE/DCCV  . Chronic systolic CHF (congestive heart failure) ([place])    a. 06/2013 Echo: EF 20%, diff HK, mild LVH.  Marland Kitchen History of cardiovascular stress test    Lexiscan Myoview 8/16:  EF 59%, attenuation artifact, no ischemia; Low Risk  . Hyperlipidemia   . Hypertension   . HYPERTENSION, BENIGN 08/21/2009   Qualifier: Diagnosis of  By: Caryl Comes, MD, Remus Blake  Medications(s)  enalapril   . Hypothyroidism   . Neoplasm of uncertain behavior    SKIN  . Polymyalgia rheumatica ([place]) 01/13/2007   Qualifier: History of  By: Danny Lawless CMA, [place]     . Presence of permanent cardiac pacemaker     Patient Active Problem List   Diagnosis Date Noted  . Blood loss anemia 12/21/2019  . Weight loss 12/17/2019  . Pneumonia involving right lung 11/28/2019  . Depression, recurrent ([place]) 11/28/2019  . Chest pain 11/06/2019  . Fall  11/01/2019  . Dysphagia 09/17/2019  . Weakness of left side of body 09/14/2019  . Slow transit constipation 09/14/2019  . Cerebral embolism with cerebral infarction 09/10/2019  . CVA (cerebral vascular accident) ([place]) 09/10/2019  . Generalized weakness 09/09/2019  . Essential hypertension 09/09/2019  . Chronic systolic CHF (congestive heart failure) ([place]) 09/09/2019  . SSS (sick sinus syndrome) ([place]) 09/09/2019  . Right radial fracture 05/07/2019  . Intracranial bleed ([place]) 05/06/2019  . BPH (benign prostatic hyperplasia) 10/20/2016  . Polycythemia 07/25/2015  . NICM (nonischemic cardiomyopathy) ([place]) 07/04/2015  . Chronic anticoagulation 07/04/2015  . CKD (chronic kidney disease) stage 3, GFR 30-59 ml/min 01/24/2015  . Encounter for therapeutic drug monitoring 06/26/2013  . Atrial flutter ([place]) 06/21/2013    Class: Acute  . Acute systolic heart failure ([place]) 06/21/2013  . Hearing loss 06/04/2013  . Routine adult health maintenance 07/30/2012  . Palpitation 07/18/2012  . Stress at home 07/18/2012  . Urinary urgency 03/12/2012  . Hypogonadism male 01/16/2010  . HYPERTENSION, BENIGN 08/21/2009    Class: Chronic  . PAF (paroxysmal atrial fibrillation) ([place]) 01/03/2008  . NEOPLASM OF UNCERTAIN BEHAVIOR OF SKIN 04/05/2007  . Hypothyroidism 01/13/2007  . Hyperlipidemia 01/13/2007  . Polymyalgia rheumatica ([place]) 01/13/2007  . INGUINAL HERNIORRHAPHY, HX OF 01/13/2007    Past Surgical History:  Procedure Laterality Date  . ATRIAL FIBRILLATION ABLATION  2007  . CARDIOVERSION N/A 06/22/2013   Procedure: CARDIOVERSION;  Surgeon: Sueanne Margarita, MD;  Location: [facility] ENDOSCOPY;  Service: Cardiovascular;  Laterality: N/A;  15:17 Lido 20mg ,IV , Propofol 30 mg, IV synched cardioversion @ 150 joules by Dr. Lajoyce Lauber from a flutter to sinis brady which is baseline for this pt, reports 45-50 reg HR verified by Dr. Ashok Norris  . CARDIOVERSION N/A 11/21/2014   Procedure: CARDIOVERSION;  Surgeon: Fay Records, MD;  Location: [facility] ENDOSCOPY;  Service: Cardiovascular;  Laterality: N/A;  . CARDIOVERSION N/A 07/14/2015   Procedure: CARDIOVERSION;  Surgeon: Sueanne Margarita, MD;  Location: [facility];  Service: Cardiovascular;  Laterality: N/A;  . CATARACT EXTRACTION, BILATERAL Bilateral Summer 2009  . CHOLECYSTECTOMY    . EP IMPLANTABLE DEVICE N/A 11/10/2015   Procedure: Pacemaker Implant;  Surgeon: [name], MD;  Location: [facility] CV LAB;  Service: Cardiovascular;  Laterality: N/A;  . HAND SURGERY Right    "broke bone; grafted area w/bone from somewhere else"  . HEMORRHOID SURGERY    . INGUINAL HERNIA REPAIR Bilateral   . TEE WITHOUT CARDIOVERSION N/A 06/22/2013   Procedure: TRANSESOPHAGEAL ECHOCARDIOGRAM (TEE);  Surgeon: Sueanne Margarita, MD;  Location: [facility] ENDOSCOPY;  Service: Cardiovascular;  Laterality: N/A;  . TEE WITHOUT CARDIOVERSION N/A 07/14/2015   Procedure: TRANSESOPHAGEAL ECHOCARDIOGRAM (TEE);  Surgeon: Sueanne Margarita, MD;  Location: [facility] ENDOSCOPY;  Service: Cardiovascular;  Laterality: N/A;  . TONSILLECTOMY         Family History  Problem Relation Age of Onset  . Emphysema Mother   . Diabetes Mother   . Kidney disease Father     Social History   Tobacco Use  . Smoking status: Former Smoker    Packs/day: 1.00    Years: 12.00    Pack years: 12.00    Types: Cigarettes    Quit date: 05/04/1963    Years since quitting: 56.6  . Smokeless tobacco: Never Used  . Tobacco comment: quit cigars 82'   Substance Use Topics  . Alcohol use: Yes    Comment: 06/21/2013 "I was a moderate drinker at most; rarely have drink now"  . Drug use: No    Home Medications Prior to Admission medications   Medication Sig Start Date End Date Taking? Authorizing Provider  antiseptic oral rinse (BIOTENE) LIQD 20 mLs by Mouth Rinse route as needed for dry mouth.     [provider]  apixaban (ELIQUIS) 5 MG TABS tablet Take 5 mg by mouth 2 (two) times daily.    [provider]   atorvastatin (LIPITOR) 40 MG tablet Take 40 mg by mouth daily.    [provider]  carboxymethylcellulose (REFRESH PLUS) 0.5 % SOLN 1 drop every 4 (four) hours as needed. Each eye    [provider]  docusate sodium (COLACE) 100 MG capsule Take 200 mg by mouth daily.    [provider]  ferrous sulfate 325 (65 FE) MG EC tablet Take 325 mg by mouth daily.    [provider]  glucosamine-chondroitin 500-400 MG tablet Take 1 tablet by mouth 3 (three) times daily.    [provider]  levothyroxine (SYNTHROID) 50 MCG tablet Take 1 tablet (50 mcg total) by mouth daily before breakfast. 08/27/19   Burchette, Alinda Sierras, MD  mirtazapine (REMERON) 15 MG tablet Take 7.5 mg by mouth at bedtime.    [provider]  Multiple Vitamin (MULTIVITAMIN WITH MINERALS) TABS  tablet Take 1 tablet by mouth daily. Centrum    [provider]  Nutritional Supplements (RESOURCE 2.0 PO) Take by mouth in the morning, at noon, and at bedtime.    [provider]  polyethylene glycol (MIRALAX / GLYCOLAX) 17 g packet Take 17 g by mouth daily. 17 gram/dose, oral, Once A Morning, Miralax 17 gm PO QD. Hold for diarrhea.    [provider]  sennosides-docusate sodium (SENOKOT-S) 8.6-50 MG tablet Take 2 tablets by mouth daily.    [provider]    Allergies    Patient has no known allergies.  Review of Systems   Review of Systems  Constitutional: Positive for fatigue. Negative for chills and fever.  Respiratory: Positive for cough. Negative for shortness of breath.   Cardiovascular: Negative for chest pain.  Gastrointestinal: Negative for abdominal pain, blood in stool, diarrhea, nausea and vomiting.  Neurological: Positive for weakness (chronic left sided). Negative for headaches.  Hematological: Bruises/bleeds easily.  All other systems reviewed and are negative.   Physical Exam Updated Vital Signs BP (!) 149/74   Pulse 83   Temp 99.1  F (37.3 C) (Oral)   Resp 16   SpO2 93%   Physical Exam Vitals and nursing note reviewed.  Constitutional:      General: He is not in acute distress.    Appearance: Normal appearance. He is well-developed. He is not ill-appearing.     Comments: Chronically ill appearing elderly male in NAD  HENT:     Head: Normocephalic and atraumatic.  Eyes:     General: No scleral icterus.       Right eye: No discharge.        Left eye: No discharge.     Conjunctiva/sclera: Conjunctivae normal.     Pupils: Pupils are equal, round, and reactive to light.  Cardiovascular:     Rate and Rhythm: Normal rate and regular rhythm.  Pulmonary:     Effort: Pulmonary effort is normal. No respiratory distress.     Breath sounds: Rhonchi (in LLL base) present.     Comments: Frequent wet cough Abdominal:     General: There is no distension.     Palpations: Abdomen is soft.     Tenderness: There is no abdominal tenderness.  Genitourinary:    Comments: Rectal: Brown stool Musculoskeletal:     Cervical back: Normal range of motion.  Skin:    General: Skin is warm and dry.  Neurological:     Mental Status: He is alert and oriented to person, place, and time.  Psychiatric:        Behavior: Behavior normal.     ED Results / Procedures / Treatments   Labs (all labs ordered are listed, but only abnormal results are displayed) Labs Reviewed  CBC WITH DIFFERENTIAL/PLATELET - Abnormal; Notable for the following components:      Result Value   RBC 2.95 (*)    Hemoglobin 8.4 (*)    HCT 27.5 (*)    RDW 15.8 (*)    Lymphs Abs 0.5 (*)    All other components within normal limits  COMPREHENSIVE METABOLIC PANEL - Abnormal; Notable for the following components:   Glucose, Bld 104 (*)    Calcium 8.5 (*)    Albumin 2.5 (*)    All other components within normal limits  IRON AND TIBC - Abnormal; Notable for the following components:   Iron 19 (*)    TIBC 143 (*)    Saturation Ratios 13 (*)  All other  components within normal limits  FERRITIN - Abnormal; Notable for the following components:   Ferritin 846 (*)    All other components within normal limits  RETICULOCYTES - Abnormal; Notable for the following components:   RBC. 2.93 (*)    Immature Retic Fract 18.1 (*)    All other components within normal limits  SARS CORONAVIRUS 2 BY RT PCR (HOSPITAL ORDER, [facility])  VITAMIN B12  FOLATE  POC OCCULT BLOOD, ED    EKG None  Radiology DG Chest Port 1 View  Result Date: 12/21/2019 CLINICAL DATA:  Lethargy, pneumonia diagnosis 1 week ago, productive cough EXAM: PORTABLE CHEST 1 VIEW COMPARISON:  09/10/2019 FINDINGS: Single frontal view of the chest demonstrates right lower lobe consolidation and trace right effusion compatible with known history of pneumonia. No pneumothorax. Left chest is clear. Cardiac silhouette is unremarkable. Dual lead pacemaker is stable. IMPRESSION: 1. Right lower lobe pneumonia with trace right parapneumonic effusion. Electronically Signed   By: Randa Ngo M.D.   On: 12/21/2019 18:00    Procedures Procedures (including critical care time)  Medications Ordered in ED Medications  amoxicillin-clavulanate (AUGMENTIN) 875-125 MG per tablet 1 tablet (1 tablet Oral Given 12/21/19 2012)  doxycycline (VIBRA-TABS) tablet 100 mg (100 mg Oral Given 12/21/19 2012)    ED Course  I have reviewed the triage vital signs and the nursing notes.  Pertinent labs & imaging results that were available during my care of the patient were reviewed by me and considered in my medical decision making (see chart for details).  86 year old male presents for evaluation of worsening anemia over the past several months. His vital signs are normal here. He is alert and oriented. Heart is regular rate and rhythm. He has a coarse, wet cough on exam and rhonchi in the LLL base. Abdomen is soft and non-tender. Rectal exam reveals brown stool and hemoccult is  negative. Will order repeat CBC, anemia panel, and CXR. Shared visit with Dr. Tomi Bamberger.  CBC shows stable hgb - it's 8.4 today. Anemia panel shows iron deficiency. Otherwise labs are unremarkable. CXR shows persistent RLL pneumonia. Discussed with the patient that he does not need a blood transfusion today but it's recommended that he stop his Eliquis for a couple days and starts iron supplementation. He can discuss GI f/u with his PCP if he would like more aggressive evaluation for this. Will also give him antibiotics for CAP. I discussed his work up and findings with his son Lesly Rubenstein as well. He is concerned that pt is not receiving a blood transfusion today but explained to him that this is not indicated emergently as findings likely represents a slow chronic process. He verbalized understanding. Will d/[facility] pt back to [facility].  MDM Rules/Calculators/A&P                           Final Clinical Impression(s) / ED Diagnoses Final diagnoses:  Community acquired pneumonia of right lower lobe of lung  Other iron deficiency anemia    Rx / DC Orders ED Discharge Orders    None       Iris Pert 12/21/19 2048    Dorie Rank, MD 12/21/19 2212

## 2019-12-21 NOTE — Progress Notes (Signed)
Nursing Home Room Number: 30 Location:    [place] of Service:  SNF (31) Provider: Lennie Odor Itali Mckendry NP  [name], MD  Patient Care Team: [name], MD as PCP - General (Internal Medicine) [name], MD (Ophthalmology) [name], MD (Orthopedic Surgery) [name], MD (Cardiology) [name], MD (General Surgery) [name], MD [facility] Medicine)  Extended Emergency Contact Information Primary Emergency Contact: Ayub,[name] Address: [place], [place] [address] [name] of [place] Phone: [phone] Mobile Phone: [phone] Relation: Spouse Secondary Emergency Contact: Noboru, Bidinger Home Phone: [phone] Relation: Spouse  Code Status:  DNR Goals of care: Advanced Directive information Advanced Directives 10/12/2019  Does Patient Have a Medical Advance Directive? Yes  Type of Paramedic of [place];Living will  Does patient want to make changes to medical advance directive? No - Patient declined  Copy of [place] in Chart? Yes - validated most recent copy scanned in chart (See row information)  Would patient like information on creating a medical advance directive? -     Chief Complaint  Patient presents with  . Acute Visit    Hgb dropped to 8.1 12/20/19 from Hgb 13.8 09/27/19    HPI:  Pt is a 86 y.o. male seen today for an acute visit for anemia, Hgb dropped from 13.8 09/27/19 to 8.1 12/20/19, the patient appears pale, denied dizziness, change of vision, abd pain, indigestion, nausea, vomiting, or blood in stool or urine. FOBT negative upon my examination.  Dysphagia, takes thickened liquids. S/p MBSS, oral intake is limited due to disliking thickened liquids.  S/p CVA left sided weakness, MRI showed right subcortical infarct mostly likely embolic nature.  HTN, takes Metoprolol qd. Afib, heart rate is  in control. Takes Eliquis, Metoprolol. Constipation, takes Senokot S, MiraLax, Colace.  Hypothyroidism, takes Levothyroxine, TSH 3.939 09/12/19 Mood/appetite/sleep, takes Mirtazapine.weight loss about #16Ibs in the past month.       Past Medical History:  Diagnosis Date  . AF (atrial fibrillation) [facility])    a. s/p RFCA/PVI @ [facility] clinic 2007.  . Arthritis    "joints" (06/21/2013)  . Atrial flutter ([place])    a. 06/2013 s/p TEE/DCCV  . Chronic systolic CHF (congestive heart failure) ([place])    a. 06/2013 Echo: EF 20%, diff HK, mild LVH.  Marland Kitchen History of cardiovascular stress test    Lexiscan Myoview 8/16:  EF 59%, attenuation artifact, no ischemia; Low Risk  . Hyperlipidemia   . Hypertension   . HYPERTENSION, BENIGN 08/21/2009   Qualifier: Diagnosis of  By: Caryl Comes, MD, Remus Blake  Medications(s)  enalapril   . Hypothyroidism   . Neoplasm of uncertain behavior    SKIN  . Polymyalgia rheumatica ([place]) 01/13/2007   Qualifier: History of  By: Danny Lawless CMA, [place]     . Presence of permanent cardiac pacemaker    Past Surgical History:  Procedure Laterality Date  . ATRIAL FIBRILLATION ABLATION  2007  . CARDIOVERSION N/A 06/22/2013   Procedure: CARDIOVERSION;  Surgeon: Sueanne Margarita, MD;  [facility] END[facility];  Service: Cardiovascular;  Laterality: N/A;  15:17 Lido 20mg ,IV , Propofol 30 mg, IV synched cardioversion @ 150 joules by Dr. Lajoyce Lauber from a flutter to sinis brady which is baseline for this pt, reports 45-50 reg HR verified by Dr. Ashok [place]  . CARDIOVERSION N/A 11/21/2014   Procedure: CARDIOVERSION;  Surgeon: Fay Records, MD;  Location: [place];  Service: Cardiovascular;  Laterality: N/A;  . CARDIOVERSION N/A 07/14/2015   Procedure: CARDIOVERSION;  Surgeon: Sueanne Margarita, MD;  Location: [place];  Service: Cardiovascular;  Laterality: N/A;  . CATARACT EXTRACTION, BILATERAL Bilateral Summer 2009  . CHOLECYSTECTOMY    . EP  IMPLANTABLE DEVICE N/A 11/10/2015   Procedure: Pacemaker Implant;  Surgeon: [name], MD;  Location: [place] CV LAB;  Service: Cardiovascular;  Laterality: N/A;  . HAND SURGERY Right    "broke bone; grafted area w/bone from somewhere else"  . HEMORRHOID SURGERY    . INGUINAL HERNIA REPAIR Bilateral   . TEE WITHOUT CARDIOVERSION N/A 06/22/2013   Procedure: TRANSESOPHAGEAL ECHOCARDIOGRAM (TEE);  Surgeon: Sueanne Margarita, MD;  Location: [facility] ENDOSCOPY;  Service: Cardiovascular;  Laterality: N/A;  . TEE WITHOUT CARDIOVERSION N/A 07/14/2015   Procedure: TRANSESOPHAGEAL ECHOCARDIOGRAM (TEE);  Surgeon: Sueanne Margarita, MD;  Location: [facility] ENDOSCOPY;  Service: Cardiovascular;  Laterality: N/A;  . TONSILLECTOMY      No Known Allergies  Allergies as of 12/21/2019   No Known Allergies     Medication List       Accurate as of December 21, 2019  4:34 PM. If you have any questions, ask your nurse or doctor.        antiseptic oral rinse Liqd 20 mLs by Mouth Rinse route as needed for dry mouth.   atorvastatin 40 MG tablet Commonly known as: LIPITOR Take 40 mg by mouth daily.   carboxymethylcellulose 0.5 % Soln Commonly known as: REFRESH PLUS 1 drop every 4 (four) hours as needed. Each eye   docusate sodium 100 MG capsule Commonly known as: COLACE Take 200 mg by mouth daily.   Eliquis 5 MG Tabs tablet Generic drug: apixaban Take 5 mg by mouth 2 (two) times daily.   ferrous sulfate 325 (65 FE) MG EC tablet Take 325 mg by mouth daily.   glucosamine-chondroitin 500-400 MG tablet Take 1 tablet by mouth 3 (three) times daily.   levothyroxine 50 MCG tablet Commonly known as: SYNTHROID Take 1 tablet (50 mcg total) by mouth daily before breakfast.   mirtazapine 15 MG tablet Commonly known as: REMERON Take 7.5 mg by mouth at bedtime.   multivitamin with minerals Tabs tablet Take 1 tablet by mouth daily. Centrum   polyethylene glycol 17 g packet Commonly known as: MIRALAX /  GLYCOLAX Take 17 g by mouth daily. 17 gram/dose, oral, Once A Morning, Miralax 17 gm PO QD. Hold for diarrhea.   RESOURCE 2.0 PO Take by mouth in the morning, at noon, and at bedtime.   sennosides-docusate sodium 8.6-50 MG tablet Commonly known as: SENOKOT-S Take 2 tablets by mouth daily.       Review of Systems  Constitutional: Positive for unexpected weight change. Negative for fatigue and fever.       Weight loss about #16Ibs in the past month.   HENT: Positive for hearing loss and trouble swallowing. Negative for congestion and voice change.   Eyes: Negative for visual disturbance.  Respiratory: Positive for cough. Negative for chest tightness, shortness of breath and wheezing.        Phlegm   Cardiovascular: Negative for chest pain, palpitations and leg swelling.  Gastrointestinal: Negative for abdominal pain, constipation, nausea and vomiting.  Genitourinary: Positive for frequency. Negative for dysuria and urgency.  Musculoskeletal: Positive for gait problem.  Skin: Positive for color change and pallor.  Neurological: Positive for facial asymmetry and weakness. Negative for speech difficulty and  light-headedness.  Psychiatric/Behavioral: Negative for behavioral problems and sleep disturbance. The patient is not nervous/anxious.     Immunization History  Administered Date(s) Administered  . Influenza Split 01/25/2012  . Influenza Whole 02/08/2008, 03/12/2009, 01/16/2010  . Influenza, High Dose Seasonal PF 01/24/2015, 03/04/2017, 02/16/2018, 12/29/2018  . Influenza,inj,Quad PF,6+ Mos 01/25/2013, 01/23/2014  . Influenza-Unspecified 01/02/2016  . PFIZER SARS-COV-2 Vaccination 05/22/2019, 06/12/2019  . Pneumococcal Conjugate-13 01/25/2013  . Pneumococcal Polysaccharide-23 05/28/2016  . Td 01/16/2010  . Tdap 05/06/2019   Pertinent  Health Maintenance Due  Topic Date Due  . INFLUENZA VACCINE  12/02/2019  . PNA vac Low Risk Adult  Completed   Fall Risk  03/23/2019  03/17/2018 02/16/2018 05/28/2016 04/08/2016  Falls in the past year? 0 1 No No No  Comment [facility] Telephone Survey: data to providers prior to load [employer] Telephone Survey: data to providers prior to load - - [facility] Telephone Survey: data to providers prior to load  Number falls in past yr: - 1 - - -  Comment - [facility] Telephone Survey Actual Response = 2 - - -  Injury with Fall? - 1 - - -   Functional Status Survey:    Vitals:   12/21/19 1614  BP: 140/66  Pulse: 78  Resp: 20  Temp: (!) 97.4 F (36.3 C)  SpO2: 97%  Weight: 126 lb 9.6 oz (57.4 kg)  Height: 5\' 9"  (1.753 m)   Body mass index is 18.7 kg/m. Physical Exam Vitals and nursing note reviewed.  Constitutional:      Appearance: Normal appearance.  HENT:     Head: Normocephalic and atraumatic.     Mouth/Throat:     Mouth: Mucous membranes are moist.  Eyes:     General: No scleral icterus.    Extraocular Movements: Extraocular movements intact.     Conjunctiva/sclera: Conjunctivae normal.     Pupils: Pupils are equal, round, and reactive to light.  Cardiovascular:     Rate and Rhythm: Normal rate and regular rhythm.     Heart sounds: No murmur heard.      Comments: Left chest pace maker Pulmonary:     Effort: Pulmonary effort is normal.     Breath sounds: Rales present. No wheezing or rhonchi.     Comments: Mostly left mid to lower lungs with deep breathing Abdominal:     General: Bowel sounds are normal.     Palpations: Abdomen is soft.     Tenderness: There is no abdominal tenderness.  Musculoskeletal:     Cervical back: Normal range of motion and neck supple.     Right lower leg: No edema.     Left lower leg: No edema.  Skin:    General: Skin is warm and dry.     Coloration: Skin is pale. Skin is not jaundiced.  Neurological:     Mental Status: He is alert and oriented to person, place, and time. Mental status is at baseline.     Cranial Nerves: Cranial nerve deficit present.     Motor: Weakness present.      Coordination: Coordination abnormal.     Gait: Gait abnormal.     Comments: LUE, LLE weakness with muscle strength 5/5  Psychiatric:        Mood and Affect: Mood normal.        Behavior: Behavior normal.        Thought Content: Thought content normal.        Judgment: Judgment normal.     Labs  reviewed: Recent Labs    09/09/19 1138 09/10/19 0621 09/12/19 0237 09/12/19 0237 09/13/19 0454 09/14/19 0301 09/27/19 0000  NA 141   < > 140   < > 140 141 142  K 4.5   < > 4.2   < > 3.9 4.2 4.1  CL 104   < > 103   < > 106 107 108  CO2 28   < > 26   < > 26 25 26*  GLUCOSE 104*   < > 104*  --  109* 118*  --   BUN 16   < > 28*   < > 33* 33* 33*  CREATININE 0.98   < > 1.17   < > 1.26* 1.14 1.0  CALCIUM 9.3   < > 8.9   < > 8.8* 8.9 9.2  MG 2.0  --   --   --   --   --   --    < > = values in this interval not displayed.   Recent Labs    05/07/19 0512 09/09/19 1138 09/27/19 0000  AST 31 21 17   ALT 8 10 6*  ALKPHOS 59 72 84  BILITOT 1.3* 1.0  --   PROT 6.0* 6.6  --   ALBUMIN 3.4* 3.8 3.9   Recent Labs    05/06/19 1320 05/07/19 0512 09/09/19 1138 09/10/19 0621 09/12/19 0237 09/12/19 0237 09/13/19 0454 09/14/19 0301 09/27/19 0000  WBC 7.8   < > 4.2   < > 6.2   < > 5.6 5.4 6.2  NEUTROABS 6.5  --  3.0  --   --   --   --   --  4,700  HGB 13.6   < > 13.4   < > 13.1   < > 12.6* 12.9* 13.8  HCT 42.6   < > 42.2   < > 40.5   < > 38.4* 39.3 41  MCV 96.4   < > 95.3   < > 93.3  --  93.9 94.0  --   PLT 155   < > 134*   < > 124*   < > 128* 133* 186   < > = values in this interval not displayed.   Lab Results  Component Value Date   TSH 3.939 09/12/2019   Lab Results  Component Value Date   HGBA1C 5.5 09/10/2019   Lab Results  Component Value Date   CHOL 133 09/10/2019   HDL 41 09/10/2019   LDLCALC 77 09/10/2019   TRIG 73 09/10/2019   CHOLHDL 3.2 09/10/2019    Significant Diagnostic Results in last 30 days:  Leanne Chang OP MEDICARE SPEECH PATH  Result Date:  12/14/2019 Objective Swallowing Evaluation: Type of Study: MBS-Modified Barium Swallow Study  Patient Details Name: [name] MRN: [id number] Date of Birth: 1925-06-14 Today's Date: 12/14/2019 Time: SLP Start Time (ACUTE ONLY): 1315 -SLP Stop Time (ACUTE ONLY): 1401 SLP Time Calculation (min) (ACUTE ONLY): 46 min Past Medical History: Past Medical History: Diagnosis Date . AF (atrial fibrillation) [facility])   a. s/p RFCA/PVI @ [facility] clinic 2007. . Arthritis   "joints" (06/21/2013) . Atrial flutter ([place])   a. 06/2013 s/p TEE/DCCV . Chronic systolic CHF (congestive heart failure) ([place])   a. 06/2013 Echo: EF 20%, diff HK, mild LVH. Marland Kitchen History of cardiovascular stress test   Lexiscan Myoview 8/16:  EF 59%, attenuation artifact, no ischemia; Low Risk . Hyperlipidemia  . Hypertension  . HYPERTENSION, BENIGN 08/21/2009  Qualifier: Diagnosis of  ByCaryl Comes, MD, Remus Blake  Medications(s)  enalapril  . Hypothyroidism  . Neoplasm of uncertain behavior   SKIN . Polymyalgia rheumatica ([place]) 01/13/2007  Qualifier: History of  By: Danny Lawless CMA, [place]    . Presence of permanent cardiac pacemaker  Past Surgical History: Past Surgical History: Procedure Laterality Date . ATRIAL FIBRILLATION ABLATION  2007 . CARDIOVERSION N/A 06/22/2013  Procedure: CARDIOVERSION;  Surgeon: Sueanne Margarita, MD;  [facility]: [facility] ENDOSCOPY;  Service: Cardiovascular;  Laterality: N/A;  15:17 Lido 20mg ,IV , Propofol 30 mg, IV synched cardioversion @ 150 joules by Dr. Lajoyce Lauber from a flutter to sinis brady which is baseline for this pt, reports 45-50 reg HR verified by Dr. Ashok Norris . CARDIOVERSION N/A 11/21/2014  Procedure: CARDIOVERSION;  Surgeon: Fay Records, MD;  Location: [facility] ENDOSCOPY;  Service: Cardiovascular;  Laterality: N/A; . CARDIOVERSION N/A 07/14/2015  Procedure: CARDIOVERSION;  Surgeon: Sueanne Margarita, MD;  Location: [place];  Service: Cardiovascular;  Laterality: N/A; . CATARACT EXTRACTION, BILATERAL Bilateral Summer 2009 . CHOLECYSTECTOMY    . EP IMPLANTABLE DEVICE N/A 11/10/2015  Procedure: Pacemaker Implant;  Surgeon: [name], MD;  Location: [place] CV LAB;  Service: Cardiovascular;  Laterality: N/A; . HAND SURGERY Right   "broke bone; grafted area w/bone from somewhere else" . HEMORRHOID SURGERY   . INGUINAL HERNIA REPAIR Bilateral  . TEE WITHOUT CARDIOVERSION N/A 06/22/2013  Procedure: TRANSESOPHAGEAL ECHOCARDIOGRAM (TEE);  Surgeon: Sueanne Margarita, MD;  Location: [facility] ENDOSCOPY;  Service: Cardiovascular;  Laterality: N/A; . TEE WITHOUT CARDIOVERSION N/A 07/14/2015  Procedure: TRANSESOPHAGEAL ECHOCARDIOGRAM (TEE);  Surgeon: Sueanne Margarita, MD;  Location: [facility] ENDOSCOPY;  Service: Cardiovascular;  Laterality: N/A; . TONSILLECTOMY   HPI: HPI: 86 yo male admitted to [facility] with weakness, dysarthria and fall with concern for acute CVA.  Pt CT scan head showed chronic lacunar CVAs x2 basal ganglia, small right cerebellar CVA.  MRI showed insular CVA. Concern for worsening symptoms, so new CT has been ordered to r/o extension.  Pt PMH + for HTN, hypothyroidism.    Pt with recent pna in July 2021.  He is being referred for repeat MBS to determine Least restrictive diet and to objectively evaluate swallowing.   Subjective: pt awake in chair Assessment / Plan / Recommendation CHL IP CLINICAL IMPRESSIONS 12/14/2019 Clinical Impression Patient presents with ongoing moderate oropharyngeal dysphagia with aspiration of thin liquids due to decreased timing of and adequacy of laryngeal elevation/closure.  Aspiration was sensed with reflexive cough noted but cough did not clear aspirates.  Oral control of barium was decreased due to pt's lingual weakness which results in premature spillage and decreased propulsion.  Piecemealing noted intermittently which was marginally delayed with pudding/nectar.   Pharyngeal swallow was inconsistent in performance, at times stronger and more efficient than others.  With cues for "fast and hard swallow" and chin tuck posture, pt  able to protect his airway with nectar, pudding, cracker.   However despite use of chin tuck, pt still aspirated with thin when retained pudding barium noted in pharynx.  Single tsps thin with head neutral were not aspirated.  Of note, if pt could elicit swallow timing before head of bolus reached over epiglottis or pyriform sinus, he did not aspirate with thin. As, penetration and aspiration of thin occur due to decreased timing and adequacy of laryngeal closure.   Thin barium pooling at pyriform sinus before swallow triggered spilled into larynx/trachea with swallow initiation.  On the 15th and final bolus of the test *  nectar*, pt noted to have swallow trigger at pyriform sinus at 2 seconds- oral residuals/piecemealed nectar bolus triggered at SEVEN secondary at pyriform sinus with pt needing verbal cues to swallow.  Suspect initiation difficulties are likely from his right frontal lobe cva impacting initiation and cerebellar/basal ganglia CVA contribute to dysphagia.  Pt does present with cough with all aspiration episodes, thus advancing diet clinically may be feasible.  Pt verbalized to this SLP that he "does not like nor want to use the thickened drinks" and he further states "I've had pneumonia anyway" indicating this to be true despite following dietary restrictions.   Aspiration mitigation strategies in place but given pt's inconsisent swallow performance, barium retention and comorbidities, he will be chronic aspiration/aspiration pna risk.  Given pt did not aspirate with thin via tsp, provided him with a Provale cup to use for thins.  Advise addressing pt's wishes for dietary advancement with MDs, NPs, family and pt especially given his advanced age, comorbidities and wishes.  Recommend continue postures of chin tuck and effortful swallows.  Strengthening tongue base retraction and laryngeal elevation advised.  Pt informed to maintain strength of "hock" to clear pharynx/valleculae and cough for airway  clearance. He reported understanding to test results/compensation strategies and use of Provale cup.  Pt also would benefit from [employer] Protocol to help him maximize hydration and give him QOL.  Thanks for this consult.   SLP Visit Diagnosis Dysphagia, oropharyngeal phase (R13.12) Attention and concentration deficit following -- Frontal lobe and executive function deficit following -- Impact on safety and function Mild aspiration risk;Moderate aspiration risk;Risk for inadequate nutrition/hydration   CHL IP TREATMENT RECOMMENDATION 09/10/2019 Treatment Recommendations Defer until completion of intrumental exam   Prognosis 09/12/2019 Prognosis for Safe Diet Advancement Fair Barriers to Reach Goals Severity of deficits Barriers/Prognosis Comment -- CHL IP DIET RECOMMENDATION 12/14/2019 SLP Diet Recommendations Dysphagia 3 (Mech soft) solids;Nectar thick liquid;Thin liquid Liquid Administration via Cup;Straw;Other (Comment) Medication Administration Other (Comment) crushed if large Compensations Slow rate;Small sips/bites;Follow solids with liquid;Effortful swallow;Chin tuck;Use straw to facilitate chin tuck, intermittent dry swallow and intermittent "hock" and swallow or expectorate Postural Changes Remain semi-upright after after feeds/meals (Comment);Seated upright at 90 degrees   CHL IP OTHER RECOMMENDATIONS 12/14/2019 Recommended Consults (No Data) Oral Care Recommendations -- Other Recommendations --   CHL IP FOLLOW UP RECOMMENDATIONS 09/13/2019 Follow up Recommendations Skilled Nursing facility   [facility] IP FREQUENCY AND DURATION 09/12/2019 Speech Therapy Frequency (ACUTE ONLY) min 2x/week Treatment Duration 1 week      CHL IP ORAL PHASE 12/14/2019 Oral Phase Impaired Oral - Pudding Teaspoon -- Oral - Pudding Cup -- Oral - Honey Teaspoon -- Oral - Honey Cup -- Oral - Nectar Teaspoon Weak lingual manipulation;Reduced posterior propulsion;Decreased bolus cohesion;Premature spillage Oral - Nectar Cup Weak lingual  manipulation;Reduced posterior propulsion;Piecemeal swallowing;Decreased bolus cohesion;Premature spillage Oral - Nectar Straw Weak lingual manipulation;Reduced posterior propulsion;Piecemeal swallowing;Decreased bolus cohesion;Premature spillage Oral - Thin Teaspoon Weak lingual manipulation;Reduced posterior propulsion;Premature spillage Oral - Thin Cup Weak lingual manipulation;Reduced posterior propulsion;Piecemeal swallowing;Decreased bolus cohesion;Premature spillage Oral - Thin Straw Weak lingual manipulation;Reduced posterior propulsion;Piecemeal swallowing;Decreased bolus cohesion;Premature spillage Oral - Puree Weak lingual manipulation;Reduced posterior propulsion Oral - Mech Soft Impaired mastication;Weak lingual manipulation;Reduced posterior propulsion Oral - Regular -- Oral - Multi-Consistency -- Oral - Pill -- Oral Phase - Comment --  CHL IP PHARYNGEAL PHASE 12/14/2019 Pharyngeal Phase Impaired Pharyngeal- Pudding Teaspoon -- Pharyngeal -- Pharyngeal- Pudding Cup -- Pharyngeal -- Pharyngeal- Honey Teaspoon -- Pharyngeal -- Pharyngeal- Honey Cup --  Pharyngeal -- Pharyngeal- Nectar Teaspoon Delayed swallow initiation-pyriform sinuses Pharyngeal Material does not enter airway Pharyngeal- Nectar Cup Reduced epiglottic inversion;Reduced tongue base retraction;Pharyngeal residue - valleculae Pharyngeal Material does not enter airway Pharyngeal- Nectar Straw -- Pharyngeal -- Pharyngeal- Thin Teaspoon Delayed swallow initiation-vallecula;Reduced epiglottic inversion;Reduced laryngeal elevation;Reduced airway/laryngeal closure;Reduced tongue base retraction;Pharyngeal residue - valleculae Pharyngeal -- Pharyngeal- Thin Cup Reduced epiglottic inversion;Reduced laryngeal elevation;Reduced airway/laryngeal closure;Reduced tongue base retraction;Penetration/Aspiration during swallow;Pharyngeal residue - valleculae Pharyngeal Material enters airway, remains ABOVE vocal cords and not ejected out;Material enters  airway, passes BELOW cords and not ejected out despite cough attempt by patient Pharyngeal- Thin Straw Delayed swallow initiation-vallecula;Reduced epiglottic inversion;Reduced laryngeal elevation;Pharyngeal residue - valleculae;Compensatory strategies attempted (with notebox) Pharyngeal -- Pharyngeal- Puree Delayed swallow initiation-vallecula;Reduced pharyngeal peristalsis;Reduced tongue base retraction;Pharyngeal residue - valleculae;Lateral channel residue Pharyngeal -- Pharyngeal- Mechanical Soft Delayed swallow initiation-vallecula;Reduced tongue base retraction;Pharyngeal residue - valleculae Pharyngeal -- Pharyngeal- Regular -- Pharyngeal -- Pharyngeal- Multi-consistency -- Pharyngeal -- Pharyngeal- Pill -- Pharyngeal -- Pharyngeal Comment --  CHL IP CERVICAL ESOPHAGEAL PHASE 12/14/2019 Cervical Esophageal Phase Impaired Pudding Teaspoon -- Pudding Cup -- Honey Teaspoon -- Honey Cup -- Nectar Teaspoon -- Nectar Cup -- Nectar Straw -- Thin Teaspoon -- Thin Cup -- Thin Straw -- Puree -- Mechanical Soft -- Regular -- Multi-consistency -- Pill -- Cervical Esophageal Comment -- Kathleen Lime, MS [facility] SLP Acute Rehab Services Office [phone] [name] 12/14/2019, 3:17 PM  CLINICAL DATA:  Dysphagia. Recent pneumonia despite intake restrictions. EXAM: MODIFIED BARIUM SWALLOW TECHNIQUE: Different consistencies of barium were administered orally to the patient by the Speech Pathologist. Imaging of the pharynx was performed in the lateral projection. The radiologist was present in the fluoroscopy room for this study, providing personal supervision. FLUOROSCOPY TIME:  Fluoroscopy Time:  1 minutes 48 seconds Radiation Exposure Index (if provided by the fluoroscopic device): 3.1 mGy Number of Acquired Spot Images: 0 COMPARISON:  None. FINDINGS: With thin barium, there was aspiration into the cervical trachea with a cough reflex. This improved with the chin tucked and did not occur when only a teaspoon of thin liquid  was swallowed at a time. There was also mild vallecular pooling. There was flash laryngeal penetration and mild vallecular pooling with nectar. Vallecular pooling was demonstrated with puree. This did not clear with subsequent swallows of nectar. Vallecular pooling was demonstrated with cracker. IMPRESSION: 1. Aspiration with thin liquid with a cough reflex. This improved with the chin tucked and did not occur when only a teaspoon of thin liquid was swallowed at a time. 2. Flash laryngeal penetration and mild vallecular pooling with nectar. 3. Vallecular pulling with puree and cracker. Please refer to the Speech Pathologists report for complete details and recommendations. Electronically Signed   By: Claudie Revering M.D.   On: 12/14/2019 14:11    Assessment/Plan Blood loss anemia Presumed clinically, Hgb 8.1 12/20/19, will repeat CBC/diff stat, obtain Iron, Fe Sat, TIBC, Ferritin, Retic count, Vit B12, Folate, FOBT x3. Hold Eliquis, start Fe daily Unable to venipuncture for CBC, will send to ED for further eval/tx.    Dysphagia On thickened liquids, coughs when swallows. S/p MBSS, oral intake is limited due to disliking thickened liquids.   CVA (cerebral vascular accident) [facility]) S/p CVA left sided weakness, MRI showed right subcortical infarct mostly likely embolic nature.    Essential hypertension Blood pressure is controlled, continue Metoprolol.   Atrial fibrillation (HCC) Heart rate is in control, continue Metoprolol, hold Eliquis x3 days while anemia study in setting of acute  Hgb dropping from 13 to 8.1 yesterday.   Slow transit constipation Stable, continue Senokot S, MiraLax, Colace  Hypothyroidism Stable, continue Levothyroxine, TSH 3.939 09/12/19  Depression, recurrent (HCC) Mood/appetite/sleep, takes Mirtazapine.weight loss about #16Ibs in the past month.     Family/ staff Communication: plan of care reviewed with the patient and charge nurse.   Labs/tests ordered: CBC/diff  stat.  Iron, Fe Sat, TIBC, Ferritin, Retic count, Vit B12, Folate, FOBT x3  Time spend 35 minutes.

## 2019-12-21 NOTE — Assessment & Plan Note (Signed)
S/p CVA left sided weakness, MRI showed right subcortical infarct mostly likely embolic nature.

## 2019-12-21 NOTE — ED Notes (Signed)
Hand off report given to [place] with [facility] pt. Is being accepted back.

## 2019-12-21 NOTE — ED Triage Notes (Addendum)
Pt BIBA from [facility]-  Per EMS- Pt being tx for pneumonia x1 week, productive cough.  Wife reports pt as lethargic, poor PO intake x 1 week.   Hx stroke in May, left sided deficits.   AOx4, pt denies complaints at this time.

## 2019-12-21 NOTE — ED Notes (Signed)
[facility] has been called  

## 2019-12-21 NOTE — Assessment & Plan Note (Signed)
   Mood/appetite/sleep, takes Mirtazapine.weight loss about #16Ibs in the past month.

## 2019-12-21 NOTE — Assessment & Plan Note (Signed)
Heart rate is in control, continue Metoprolol, hold Eliquis x3 days while anemia study in setting of acute Hgb dropping from 13 to 8.1 yesterday.

## 2019-12-21 NOTE — Assessment & Plan Note (Addendum)
Presumed clinically, Hgb 8.1 12/20/19, will repeat CBC/diff stat, obtain Iron, Fe Sat, TIBC, Ferritin, Retic count, Vit B12, Folate, FOBT x3. Hold Eliquis, start Fe daily Unable to venipuncture for CBC, will send to ED for further eval/tx.

## 2019-12-21 NOTE — Assessment & Plan Note (Signed)
Stable, continue Senokot S, MiraLax, Colace

## 2019-12-21 NOTE — Assessment & Plan Note (Addendum)
On thickened liquids, coughs when swallows. S/p MBSS, oral intake is limited due to disliking thickened liquids.

## 2019-12-21 NOTE — Assessment & Plan Note (Signed)
Stable, continue Levothyroxine, TSH 3.939 09/12/19

## 2019-12-21 NOTE — Discharge Instructions (Addendum)
Please stop the Eliquis for the next 3 days Start Iron supplement daily Start Augmentin and Doxycycline for pneumonia. Take Augmentin twice a day for five days and Doxycycline twice a day for 5 days. Please follow up with your doctor for a recheck of your blood counts.

## 2019-12-21 NOTE — Assessment & Plan Note (Signed)
Blood pressure is controlled, continue Metoprolol. 

## 2019-12-22 DIAGNOSIS — R5381 Other malaise: Secondary | ICD-10-CM | POA: Diagnosis not present

## 2019-12-22 DIAGNOSIS — M2550 Pain in unspecified joint: Secondary | ICD-10-CM | POA: Diagnosis not present

## 2019-12-22 DIAGNOSIS — Z7401 Bed confinement status: Secondary | ICD-10-CM | POA: Diagnosis not present

## 2019-12-24 ENCOUNTER — Non-Acute Institutional Stay (SKILLED_NURSING_FACILITY): Payer: Medicare Other | Admitting: Nurse Practitioner

## 2019-12-24 ENCOUNTER — Encounter: Payer: Self-pay | Admitting: Nurse Practitioner

## 2019-12-24 DIAGNOSIS — D509 Iron deficiency anemia, unspecified: Secondary | ICD-10-CM | POA: Diagnosis not present

## 2019-12-24 DIAGNOSIS — I69354 Hemiplegia and hemiparesis following cerebral infarction affecting left non-dominant side: Secondary | ICD-10-CM | POA: Diagnosis not present

## 2019-12-24 DIAGNOSIS — R279 Unspecified lack of coordination: Secondary | ICD-10-CM | POA: Diagnosis not present

## 2019-12-24 DIAGNOSIS — E039 Hypothyroidism, unspecified: Secondary | ICD-10-CM | POA: Diagnosis not present

## 2019-12-24 DIAGNOSIS — I10 Essential (primary) hypertension: Secondary | ICD-10-CM

## 2019-12-24 DIAGNOSIS — I639 Cerebral infarction, unspecified: Secondary | ICD-10-CM | POA: Diagnosis not present

## 2019-12-24 DIAGNOSIS — R1312 Dysphagia, oropharyngeal phase: Secondary | ICD-10-CM | POA: Diagnosis not present

## 2019-12-24 DIAGNOSIS — K5901 Slow transit constipation: Secondary | ICD-10-CM

## 2019-12-24 DIAGNOSIS — I63112 Cerebral infarction due to embolism of left vertebral artery: Secondary | ICD-10-CM | POA: Diagnosis not present

## 2019-12-24 DIAGNOSIS — D519 Vitamin B12 deficiency anemia, unspecified: Secondary | ICD-10-CM | POA: Diagnosis not present

## 2019-12-24 DIAGNOSIS — M6281 Muscle weakness (generalized): Secondary | ICD-10-CM | POA: Diagnosis not present

## 2019-12-24 DIAGNOSIS — J189 Pneumonia, unspecified organism: Secondary | ICD-10-CM

## 2019-12-24 DIAGNOSIS — I480 Paroxysmal atrial fibrillation: Secondary | ICD-10-CM

## 2019-12-24 DIAGNOSIS — F339 Major depressive disorder, recurrent, unspecified: Secondary | ICD-10-CM | POA: Diagnosis not present

## 2019-12-24 DIAGNOSIS — R634 Abnormal weight loss: Secondary | ICD-10-CM | POA: Diagnosis not present

## 2019-12-24 DIAGNOSIS — E611 Iron deficiency: Secondary | ICD-10-CM | POA: Diagnosis not present

## 2019-12-24 DIAGNOSIS — R2689 Other abnormalities of gait and mobility: Secondary | ICD-10-CM | POA: Diagnosis not present

## 2019-12-24 DIAGNOSIS — R718 Other abnormality of red blood cells: Secondary | ICD-10-CM | POA: Diagnosis not present

## 2019-12-24 DIAGNOSIS — R7989 Other specified abnormal findings of blood chemistry: Secondary | ICD-10-CM | POA: Diagnosis not present

## 2019-12-24 NOTE — Assessment & Plan Note (Signed)
S/p CVA left sided weakness, MRI showed right subcortical infarct mostly likely embolic nature.

## 2019-12-24 NOTE — Assessment & Plan Note (Signed)
Heart rate is in control, continue Metoprolol, Eliquis.  

## 2019-12-24 NOTE — Assessment & Plan Note (Signed)
TSH wnl 09/2019, continue Levothyroxine.

## 2019-12-24 NOTE — Assessment & Plan Note (Signed)
Dysphagia, takes thickened liquids.S/p MBSS, oral intake is limited due to disliking thickened liquids.

## 2019-12-24 NOTE — Assessment & Plan Note (Addendum)
Stable, continue Senokot S, MiraLax, Colace.

## 2019-12-24 NOTE — Progress Notes (Signed)
Location:   SNF [place] Room Number: 74 Place of Service:  SNF (31) Provider: Lennie Odor Mast NP  [name], MD  Patient Care Team: [name], MD as PCP - General (Internal Medicine) [name], MD (Ophthalmology) [name], MD (Orthopedic Surgery) [name], MD (Cardiology) [name], MD (General Surgery) [name], MD [facility] Medicine)  Extended Emergency Contact Information Primary Emergency Contact: Correa,[name] Address: [place], [place] [address] [name] of [place] Phone: [phone] Mobile Phone: [phone] Relation: Spouse Secondary Emergency Contact: Lino, [place] Mobile Phone: [phone] Relation: Son  Code Status:  DNR Goals of care: Advanced Directive information Advanced Directives 12/24/2019  Does Patient Have a Medical Advance Directive? Yes  Type of Paramedic of [place];Living will  Does patient want to make changes to medical advance directive? No - Patient declined  Copy of [place] in Chart? Yes - validated most recent copy scanned in chart (See row information)  Would patient like information on creating a medical advance directive? -     Chief Complaint  Patient presents with  . Acute Visit    Poor oral intake    HPI:  Pt is a 86 y.o. male seen today for an acute visit for persisted limited oral intake, weight loss, dysphagia, generalized weakness. Dysphagia, takes thickened liquids.S/p MBSS, oral intake is limited due to disliking thickened liquids.  Anemia, 12/21/19 ED, Hgb 8.4, low Iron 19, place on Iron daily, no GI Bleeding, may be GI consult.   Also in ED CXR RLL PNA, placed on Doxycycline 163m bid x 5 days.  S/p CVA left sided weakness, MRI showed right subcortical infarct mostly likely embolic nature.  HTN, takes Metoprolol qd. Afib, heart rate is in control. Takes  Eliquis, Metoprolol. Constipation, takes Senokot S, MiraLax, Colace.  Hypothyroidism, takes Levothyroxine, TSH 3.939 09/12/19 Mood/appetite/sleep, takes Mirtazapine.weight loss about #16Ibs in the past month.      Past Medical History:  Diagnosis Date  . AF (atrial fibrillation) ([facility]    [facility]  . Arthritis    "joints" (06/21/2013)  . Atrial flutter ([place]    a. 06/2013 s/p TEE/DCCV  . Chronic systolic CHF (congestive heart failure) ([facility]    a. 06/2013 Echo: EF 20%, diff HK, mild LVH.  .Marland KitchenHistory of cardiovascular stress test    Lexiscan Myoview 8/16:  EF 59%, attenuation artifact, no ischemia; Low Risk  . Hyperlipidemia   . Hypertension   . HYPERTENSION, BENIGN 08/21/2009   Qualifier: Diagnosis of  By: KCaryl Comes MD, FRemus Blake Medications(s)  enalapril   . Hypothyroidism   . Neoplasm of uncertain behavior    SKIN  . [place]   Qualifier: History of  By: SDanny LawlessCMA, [place]    . Presence of permanent cardiac pacemaker    Past Surgical History:  Procedure Laterality Date  . ATRIAL FIBRILLATION ABLATION  2007  . CARDIOVERSION N/A 06/22/2013   Procedure: CARDIOVERSION;  Surgeon: TSueanne Margarita MD;  [facility];  Service: Cardiovascular;  Laterality: N/A;  15:17 Lido 243mIV , Propofol 30 mg, IV synched cardioversion @ 150 joules by Dr. TuLajoyce Lauberrom a flutter to sinis brady which is baseline for this pt, reports 45-50 reg HR verified by Dr. T.Ashok Norris. CARDIOVERSION N/A 11/21/2014   Procedure: CARDIOVERSION;  Surgeon: PaFay RecordsMD;  Location: [place];  Service: Cardiovascular;  Laterality: N/A;  . CARDIOVERSION N/A 07/14/2015   Procedure: CARDIOVERSION;  Surgeon: Sueanne Margarita, MD;  Location: [place];  Service: Cardiovascular;  Laterality: N/A;  . CATARACT EXTRACTION, BILATERAL Bilateral Summer 2009  . CHOLECYSTECTOMY    . EP IMPLANTABLE DEVICE N/A  11/10/2015   Procedure: Pacemaker Implant;  Surgeon: [name], MD;  Location: [place] CV LAB;  Service: Cardiovascular;  Laterality: N/A;  . HAND SURGERY Right    "broke bone; grafted area w/bone from somewhere else"  . HEMORRHOID SURGERY    . INGUINAL HERNIA REPAIR Bilateral   . TEE WITHOUT CARDIOVERSION N/A 06/22/2013   Procedure: TRANSESOPHAGEAL ECHOCARDIOGRAM (TEE);  Surgeon: Sueanne Margarita, MD;  Location: [facility] ENDOSCOPY;  Service: Cardiovascular;  Laterality: N/A;  . TEE WITHOUT CARDIOVERSION N/A 07/14/2015   Procedure: TRANSESOPHAGEAL ECHOCARDIOGRAM (TEE);  Surgeon: Sueanne Margarita, MD;  Location: [facility] ENDOSCOPY;  Service: Cardiovascular;  Laterality: N/A;  . TONSILLECTOMY      No Known Allergies  Allergies as of 12/24/2019   No Known Allergies     Medication List       Accurate as of December 24, 2019 11:59 PM. If you have any questions, ask your nurse or doctor.        amoxicillin-clavulanate 875-125 MG tablet Commonly known as: AUGMENTIN Take 1 tablet by mouth 2 (two) times daily.   antiseptic oral rinse Liqd 20 mLs by Mouth Rinse route as needed for dry mouth.   atorvastatin 40 MG tablet Commonly known as: LIPITOR Take 40 mg by mouth daily.   carboxymethylcellulose 0.5 % Soln Commonly known as: REFRESH PLUS 1 drop every 4 (four) hours as needed. Each eye   docusate sodium 100 MG capsule Commonly known as: COLACE Take 200 mg by mouth daily.   doxycycline 100 MG capsule Commonly known as: VIBRAMYCIN Take 1 capsule (100 mg total) by mouth 2 (two) times daily.   Eliquis 5 MG Tabs tablet Generic drug: apixaban Take 5 mg by mouth 2 (two) times daily.   ferrous sulfate 325 (65 FE) MG EC tablet Take 325 mg by mouth daily.   glucosamine-chondroitin 500-400 MG tablet Take 1 tablet by mouth 3 (three) times daily.   levothyroxine 50 MCG tablet Commonly known as: SYNTHROID Take 1 tablet (50 mcg total) by mouth daily before breakfast.   mirtazapine 15 MG  tablet Commonly known as: REMERON Take 7.5 mg by mouth at bedtime.   multivitamin with minerals Tabs tablet Take 1 tablet by mouth daily. Centrum   polyethylene glycol 17 g packet Commonly known as: MIRALAX / GLYCOLAX Take 17 g by mouth daily. 17 gram/dose, oral, Once A Morning, Miralax 17 gm PO QD. Hold for diarrhea.   RESOURCE 2.0 PO Take by mouth in the morning, at noon, and at bedtime.   sennosides-docusate sodium 8.6-50 MG tablet Commonly known as: SENOKOT-S Take 2 tablets by mouth daily.       Review of Systems  Constitutional: Positive for appetite change, fatigue and unexpected weight change. Negative for fever.       Weight loss about #16Ibs in the past month.   HENT: Positive for hearing loss and trouble swallowing. Negative for congestion and voice change.   Eyes: Negative for visual disturbance.  Respiratory: Positive for cough. Negative for shortness of breath.        Phlegm   Cardiovascular: Negative for palpitations and leg swelling.  Gastrointestinal: Negative for abdominal pain, blood in stool, constipation, nausea  and vomiting.  Genitourinary: Positive for frequency. Negative for dysuria and urgency.  Musculoskeletal: Positive for gait problem.  Skin: Positive for color change and pallor.  Neurological: Positive for facial asymmetry and weakness. Negative for speech difficulty and light-headedness.  Psychiatric/Behavioral: Negative for behavioral problems and sleep disturbance. The patient is not nervous/anxious.     Immunization History  Administered Date(s) Administered  . Influenza Split 01/25/2012  . Influenza Whole 02/08/2008, 03/12/2009, 01/16/2010  . Influenza, High Dose Seasonal PF 01/24/2015, 03/04/2017, 02/16/2018, 12/29/2018  . Influenza,inj,Quad PF,6+ Mos 01/25/2013, 01/23/2014  . Influenza-Unspecified 01/02/2016  . PFIZER SARS-COV-2 Vaccination 05/22/2019, 06/12/2019  . Pneumococcal Conjugate-13 01/25/2013  . Pneumococcal Polysaccharide-23  05/28/2016  . Td 01/16/2010  . Tdap 05/06/2019   Pertinent  Health Maintenance Due  Topic Date Due  . INFLUENZA VACCINE  12/02/2019  . PNA vac Low Risk Adult  Completed   Fall Risk  03/23/2019 03/17/2018 02/16/2018 05/28/2016 04/08/2016  Falls in the past year? 0 1 No No No  Comment [employer] Telephone Survey: data to providers prior to load [employer] Telephone Survey: data to providers prior to load - - Emmi Telephone Survey: data to providers prior to load  Number falls in past yr: - 1 - - -  Comment - Emmi Telephone Survey Actual Response = 2 - - -  Injury with Fall? - 1 - - -   Functional Status Survey:    Vitals:   12/24/19 1604  BP: 140/66  Pulse: 78  Resp: 20  Temp: 97.6 F (36.4 C)  SpO2: 94%  Weight: 126 lb 9.6 oz (57.4 kg)  Height: 5' 9" (1.753 m)   Body mass index is 18.7 kg/m. Physical Exam Vitals and nursing note reviewed.  Constitutional:      Comments: Appears tired.   HENT:     Head: Normocephalic and atraumatic.     Mouth/Throat:     Mouth: Mucous membranes are dry.  Eyes:     General: No scleral icterus.    Extraocular Movements: Extraocular movements intact.     Conjunctiva/sclera: Conjunctivae normal.     Pupils: Pupils are equal, round, and reactive to light.  Cardiovascular:     Rate and Rhythm: Normal rate and regular rhythm.     Heart sounds: No murmur heard.      Comments: Left chest pace maker Pulmonary:     Effort: Pulmonary effort is normal.     Breath sounds: Rales present. No wheezing or rhonchi.     Comments: Mostly left mid to lower lungs with deep breathing Abdominal:     General: Bowel sounds are normal.     Palpations: Abdomen is soft.     Tenderness: There is no abdominal tenderness.  Musculoskeletal:     Cervical back: Normal range of motion and neck supple.     Right lower leg: No edema.     Left lower leg: No edema.  Skin:    General: Skin is warm and dry.     Coloration: Skin is pale.  Neurological:     Mental Status: He is  alert and oriented to person, place, and time. Mental status is at baseline.     Cranial Nerves: Cranial nerve deficit present.     Motor: Weakness present.     Coordination: Coordination abnormal.     Gait: Gait abnormal.     Comments: LUE, LLE weakness with muscle strength 5/5  Psychiatric:        Mood and Affect: Mood normal.  Behavior: Behavior normal.        Thought Content: Thought content normal.        Judgment: Judgment normal.     Labs reviewed: Recent Labs    09/09/19 1138 09/10/19 0621 09/13/19 0454 09/13/19 0454 09/14/19 0301 09/27/19 0000 12/21/19 1741  NA 141   < > 140   < > 141 142 138  K 4.5   < > 3.9   < > 4.2 4.1 4.0  CL 104   < > 106   < > 107 108 102  CO2 28   < > 26   < > 25 26* 27  GLUCOSE 104*   < > 109*  --  118*  --  104*  BUN 16   < > 33*   < > 33* 33* 17  CREATININE 0.98   < > 1.26*   < > 1.14 1.0 0.68  CALCIUM 9.3   < > 8.8*   < > 8.9 9.2 8.5*  MG 2.0  --   --   --   --   --   --    < > = values in this interval not displayed.   Recent Labs    05/07/19 0512 05/07/19 0512 09/09/19 1138 09/27/19 0000 12/21/19 1741  AST 31   < > _0 ALT 8   < > 10 6* 13  ALKPHOS 59   < > 72 84 75  BILITOT 1.3*  --  1.0  --  0.7  PROT 6.0*  --  6.6  --  7.3  ALBUMIN 3.4*   < > 3.8 3.9 2.5*   < > = values in this interval not displayed.   Recent Labs    09/09/19 1138 09/10/19 0621 09/13/19 0454 09/13/19 0454 09/14/19 0301 09/27/19 0000 12/21/19 1741  WBC 4.2   < > 5.6   < > 5.4 6.2 7.3  NEUTROABS 3.0  --   --   --   --  4,700 6.2  HGB 13.4   < > 12.6*   < > 12.9* 13.8 8.4*  HCT 42.2   < > 38.4*   < > 39.3 41 27.5*  MCV 95.3   < > 93.9  --  94.0  --  93.2  PLT 134*   < > 128*   < > 133* 186 218   < > = values in this interval not displayed.   Lab Results  Component Value Date   TSH 3.939 09/12/2019   Lab Results  Component Value Date   HGBA1C 5.5 09/10/2019   Lab Results  Component Value Date   CHOL 133 09/10/2019   HDL  41 09/10/2019   LDLCALC 77 09/10/2019   TRIG 73 09/10/2019   CHOLHDL 3.2 09/10/2019    Significant Diagnostic Results in last 30 days:  Leanne Chang OP MEDICARE SPEECH PATH  Result Date: 12/14/2019 Objective Swallowing Evaluation: Type of Study: MBS-Modified Barium Swallow Study  Patient Details Name: [name] MRN: [id number] Date of Birth: June 02, 1925 Today's Date: 12/14/2019 Time: SLP Start Time (ACUTE ONLY): 1315 -SLP Stop Time (ACUTE ONLY): 1401 SLP Time Calculation (min) (ACUTE ONLY): 46 min Past Medical History: Past Medical History: Diagnosis Date . AF (atrial fibrillation) [facility])   a. s/p RFCA/PVI @ [facility] 2007. . Arthritis   "joints" (06/21/2013) . Atrial flutter ([place])   a. 06/2013 s/p TEE/DCCV . Chronic systolic CHF (congestive heart failure) ([place])   a. 06/2013 Echo: EF 20%,  diff HK, mild LVH. Marland Kitchen History of cardiovascular stress test   Lexiscan Myoview 8/16:  EF 59%, attenuation artifact, no ischemia; Low Risk . Hyperlipidemia  . Hypertension  . HYPERTENSION, BENIGN 08/21/2009  Qualifier: Diagnosis of  By: Caryl Comes, MD, Remus Blake  Medications(s)  enalapril  . Hypothyroidism  . Neoplasm of uncertain behavior   SKIN . Polymyalgia rheumatica ([place]) 01/13/2007  Qualifier: History of  By: Danny Lawless CMA, [place]    . Presence of permanent cardiac pacemaker  Past Surgical History: Past Surgical History: Procedure Laterality Date . ATRIAL FIBRILLATION ABLATION  2007 . CARDIOVERSION N/A 06/22/2013  Procedure: CARDIOVERSION;  Surgeon: Sueanne Margarita, MD;  [facility] ENDOS[facility];  Service: Cardiovascular;  Laterality: N/A;  15:17 Lido 55m,IV , Propofol 30 mg, IV synched cardioversion @ 150 joules by Dr. TLajoyce Lauberfrom a flutter to sinis brady which is baseline for this pt, reports 45-50 reg HR verified by Dr. TAshok Norris. CARDIOVERSION N/A 11/21/2014  Procedure: CARDIOVERSION;  Surgeon: PFay Records MD;  [facility];  Service: Cardiovascular;  Laterality: N/A; . CARDIOVERSION N/A 07/14/2015   Procedure: CARDIOVERSION;  Surgeon: TSueanne Margarita MD;  [place]  Service: Cardiovascular;  Laterality: N/A; . CATARACT EXTRACTION, BILATERAL Bilateral Summer 2009 . CHOLECYSTECTOMY   . EP IMPLANTABLE DEVICE N/A 11/10/2015  Procedure: Pacemaker Implant;  Surgeon: SDeboraha Sprang MD;  [place];  Service: Cardiovascular;  Laterality: N/A; . HAND SURGERY Right   "broke bone; grafted area w/bone from somewhere else" . HEMORRHOID SURGERY   . INGUINAL HERNIA REPAIR Bilateral  . TEE WITHOUT CARDIOVERSION N/A 06/22/2013  Procedure: TRANSESOPHAGEAL ECHOCARDIOGRAM (TEE);  Surgeon: TSueanne Margarita MD;  [facility];  Service: Cardiovascular;  Laterality: N/A; . TEE WITHOUT CARDIOVERSION N/A 07/14/2015  Procedure: TRANSESOPHAGEAL ECHOCARDIOGRAM (TEE);  Surgeon: TSueanne Margarita MD;  [facility];  Service: Cardiovascular;  Laterality: N/A; . TONSILLECTOMY   HPI: HPI: 86[facility], [facility] with concern for acute CVA.  Pt CT scan head showed chronic lacunar CVAs x2 basal ganglia, small right cerebellar CVA.  MRI showed insular CVA. Concern for worsening symptoms, so new CT has been ordered to r/o extension.  Pt PMH + for HTN, hypothyroidism.    Pt with recent pna in July 2021.  He is being referred for repeat MBS to determine Least restrictive diet and to objectively evaluate swallowing.   Subjective: pt awake in chair Assessment / Plan / Recommendation CHL IP CLINICAL IMPRESSIONS 12/14/2019 Clinical Impression Patient presents with ongoing moderate oropharyngeal dysphagia with aspiration of thin liquids due to decreased timing of and adequacy of laryngeal elevation/closure.  Aspiration was sensed with reflexive cough noted but cough did not clear aspirates.  Oral control of barium was decreased due to pt's lingual weakness which results in premature spillage and decreased propulsion.  Piecemealing noted intermittently which was marginally  delayed with pudding/nectar.   Pharyngeal swallow was inconsistent in performance, at times stronger and more efficient than others.  With cues for "fast and hard swallow" and chin tuck posture, pt able to protect his airway with nectar, pudding, cracker.   However despite use of chin tuck, pt still aspirated with thin when retained pudding barium noted in pharynx.  Single tsps thin with head neutral were not aspirated.  Of note, if pt could elicit swallow timing before head of bolus reached over epiglottis or pyriform sinus, he did not aspirate with thin. As, penetration and aspiration  of thin occur due to decreased timing and adequacy of laryngeal closure.   Thin barium pooling at pyriform sinus before swallow triggered spilled into larynx/trachea with swallow initiation.  On the 15th and final bolus of the test *nectar*, pt noted to have swallow trigger at pyriform sinus at 2 seconds- oral residuals/piecemealed nectar bolus triggered at SEVEN secondary at pyriform sinus with pt needing verbal cues to swallow.  Suspect initiation difficulties are likely from his right frontal lobe cva impacting initiation and cerebellar/basal ganglia CVA contribute to dysphagia.  Pt does present with cough with all aspiration episodes, thus advancing diet clinically may be feasible.  Pt verbalized to this SLP that he "does not like nor want to use the thickened drinks" and he further states "I've had pneumonia anyway" indicating this to be true despite following dietary restrictions.   Aspiration mitigation strategies in place but given pt's inconsisent swallow performance, barium retention and comorbidities, he will be chronic aspiration/aspiration pna risk.  Given pt did not aspirate with thin via tsp, provided him with a Provale cup to use for thins.  Advise addressing pt's wishes for dietary advancement with MDs, NPs, family and pt especially given his advanced age, comorbidities and wishes.  Recommend continue postures of  chin tuck and effortful swallows.  Strengthening tongue base retraction and laryngeal elevation advised.  Pt informed to maintain strength of "hock" to clear pharynx/valleculae and cough for airway clearance. He reported understanding to test results/compensation strategies and use of Provale cup.  Pt also would benefit from [employer] Protocol to help him maximize hydration and give him QOL.  Thanks for this consult.   SLP Visit Diagnosis Dysphagia, oropharyngeal phase (R13.12) Attention and concentration deficit following -- Frontal lobe and executive function deficit following -- Impact on safety and function Mild aspiration risk;Moderate aspiration risk;Risk for inadequate nutrition/hydration   CHL IP TREATMENT RECOMMENDATION 09/10/2019 Treatment Recommendations Defer until completion of intrumental exam   Prognosis 09/12/2019 Prognosis for Safe Diet Advancement Fair Barriers to Reach Goals Severity of deficits Barriers/Prognosis Comment -- CHL IP DIET RECOMMENDATION 12/14/2019 SLP Diet Recommendations Dysphagia 3 (Mech soft) solids;Nectar thick liquid;Thin liquid Liquid Administration via Cup;Straw;Other (Comment) Medication Administration Other (Comment) crushed if large Compensations Slow rate;Small sips/bites;Follow solids with liquid;Effortful swallow;Chin tuck;Use straw to facilitate chin tuck, intermittent dry swallow and intermittent "hock" and swallow or expectorate Postural Changes Remain semi-upright after after feeds/meals (Comment);Seated upright at 90 degrees   CHL IP OTHER RECOMMENDATIONS 12/14/2019 Recommended Consults (No Data) Oral Care Recommendations -- Other Recommendations --   CHL IP FOLLOW UP RECOMMENDATIONS 09/13/2019 Follow up Recommendations Skilled Nursing facility   [facility] IP FREQUENCY AND DURATION 09/12/2019 Speech Therapy Frequency (ACUTE ONLY) min 2x/week Treatment Duration 1 week      CHL IP ORAL PHASE 12/14/2019 Oral Phase Impaired Oral - Pudding Teaspoon -- Oral - Pudding Cup -- Oral  - Honey Teaspoon -- Oral - Honey Cup -- Oral - Nectar Teaspoon Weak lingual manipulation;Reduced posterior propulsion;Decreased bolus cohesion;Premature spillage Oral - Nectar Cup Weak lingual manipulation;Reduced posterior propulsion;Piecemeal swallowing;Decreased bolus cohesion;Premature spillage Oral - Nectar Straw Weak lingual manipulation;Reduced posterior propulsion;Piecemeal swallowing;Decreased bolus cohesion;Premature spillage Oral - Thin Teaspoon Weak lingual manipulation;Reduced posterior propulsion;Premature spillage Oral - Thin Cup Weak lingual manipulation;Reduced posterior propulsion;Piecemeal swallowing;Decreased bolus cohesion;Premature spillage Oral - Thin Straw Weak lingual manipulation;Reduced posterior propulsion;Piecemeal swallowing;Decreased bolus cohesion;Premature spillage Oral - Puree Weak lingual manipulation;Reduced posterior propulsion Oral - Mech Soft Impaired mastication;Weak lingual manipulation;Reduced posterior propulsion Oral - Regular -- Oral - Multi-Consistency --  Oral - Pill -- Oral Phase - Comment --  CHL IP PHARYNGEAL PHASE 12/14/2019 Pharyngeal Phase Impaired Pharyngeal- Pudding Teaspoon -- Pharyngeal -- Pharyngeal- Pudding Cup -- Pharyngeal -- Pharyngeal- Honey Teaspoon -- Pharyngeal -- Pharyngeal- Honey Cup -- Pharyngeal -- Pharyngeal- Nectar Teaspoon Delayed swallow initiation-pyriform sinuses Pharyngeal Material does not enter airway Pharyngeal- Nectar Cup Reduced epiglottic inversion;Reduced tongue base retraction;Pharyngeal residue - valleculae Pharyngeal Material does not enter airway Pharyngeal- Nectar Straw -- Pharyngeal -- Pharyngeal- Thin Teaspoon Delayed swallow initiation-vallecula;Reduced epiglottic inversion;Reduced laryngeal elevation;Reduced airway/laryngeal closure;Reduced tongue base retraction;Pharyngeal residue - valleculae Pharyngeal -- Pharyngeal- Thin Cup Reduced epiglottic inversion;Reduced laryngeal elevation;Reduced airway/laryngeal closure;Reduced  tongue base retraction;Penetration/Aspiration during swallow;Pharyngeal residue - valleculae Pharyngeal Material enters airway, remains ABOVE vocal cords and not ejected out;Material enters airway, passes BELOW cords and not ejected out despite cough attempt by patient Pharyngeal- Thin Straw Delayed swallow initiation-vallecula;Reduced epiglottic inversion;Reduced laryngeal elevation;Pharyngeal residue - valleculae;Compensatory strategies attempted (with notebox) Pharyngeal -- Pharyngeal- Puree Delayed swallow initiation-vallecula;Reduced pharyngeal peristalsis;Reduced tongue base retraction;Pharyngeal residue - valleculae;Lateral channel residue Pharyngeal -- Pharyngeal- Mechanical Soft Delayed swallow initiation-vallecula;Reduced tongue base retraction;Pharyngeal residue - valleculae Pharyngeal -- Pharyngeal- Regular -- Pharyngeal -- Pharyngeal- Multi-consistency -- Pharyngeal -- Pharyngeal- Pill -- Pharyngeal -- Pharyngeal Comment --  CHL IP CERVICAL ESOPHAGEAL PHASE 12/14/2019 Cervical Esophageal Phase Impaired Pudding Teaspoon -- Pudding Cup -- Honey Teaspoon -- Honey Cup -- Nectar Teaspoon -- Nectar Cup -- Nectar Straw -- Thin Teaspoon -- Thin Cup -- Thin Straw -- Puree -- Mechanical Soft -- Regular -- Multi-consistency -- Pill -- Cervical Esophageal Comment -- Kathleen Lime, MS [facility] SLP Acute Rehab Services Office [phone] [name] 12/14/2019, 3:17 PM  CLINICAL DATA:  Dysphagia. Recent pneumonia despite intake restrictions. EXAM: MODIFIED BARIUM SWALLOW TECHNIQUE: Different consistencies of barium were administered orally to the patient by the Speech Pathologist. Imaging of the pharynx was performed in the lateral projection. The radiologist was present in the fluoroscopy room for this study, providing personal supervision. FLUOROSCOPY TIME:  Fluoroscopy Time:  1 minutes 48 seconds Radiation Exposure Index (if provided by the fluoroscopic device): 3.1 mGy Number of Acquired Spot Images: 0 COMPARISON:   None. FINDINGS: With thin barium, there was aspiration into the cervical trachea with a cough reflex. This improved with the chin tucked and did not occur when only a teaspoon of thin liquid was swallowed at a time. There was also mild vallecular pooling. There was flash laryngeal penetration and mild vallecular pooling with nectar. Vallecular pooling was demonstrated with puree. This did not clear with subsequent swallows of nectar. Vallecular pooling was demonstrated with cracker. IMPRESSION: 1. Aspiration with thin liquid with a cough reflex. This improved with the chin tucked and did not occur when only a teaspoon of thin liquid was swallowed at a time. 2. Flash laryngeal penetration and mild vallecular pooling with nectar. 3. Vallecular pulling with puree and cracker. Please refer to the Speech Pathologists report for complete details and recommendations. Electronically Signed   By: Claudie Revering M.D.   On: 12/14/2019 14:11   DG Chest Port 1 View  Result Date: 12/21/2019 CLINICAL DATA:  Lethargy, pneumonia diagnosis 1 week ago, productive cough EXAM: PORTABLE CHEST 1 VIEW COMPARISON:  09/10/2019 FINDINGS: Single frontal view of the chest demonstrates right lower lobe consolidation and trace right effusion compatible with known history of pneumonia. No pneumothorax. Left chest is clear. Cardiac silhouette is unremarkable. Dual lead pacemaker is stable. IMPRESSION: 1. Right lower lobe pneumonia with trace right parapneumonic effusion. Electronically Signed  By: Randa Ngo M.D.   On: 12/21/2019 18:00    Assessment/Plan Weight loss Persisted, due to limited oral intake as result of dysphagia and thickened liquids. The patient declined feeding tube option if condition worsens, will update CBC/diff, CMP/eGFR  Dysphagia Dysphagia, takes thickened liquids.S/p MBSS, oral intake is limited due to disliking thickened liquids.   Iron deficiency anemia Anemia, 12/21/19 ED, Hgb 8.4, low Iron 19, continue  Iron daily, no GI Bleeding, may be GI consult. Update CBC/diff.    Pneumonia involving right lung  ED CXR RLL PNA, placed on Doxycycline 175m bid x 5 days.    CVA (cerebral vascular accident) ([facility] S/p CVA left sided weakness, MRI showed right subcortical infarct mostly likely embolic nature.    Essential hypertension Blood pressure is controlled, continue Metoprolol.   PAF (paroxysmal atrial fibrillation) (HCC) Heart rate is in control, continue Metoprolol, Eliquis.   Slow transit constipation Stable, continue Senokot S, MiraLax, Colace.   Hypothyroidism TSH wnl 09/2019, continue Levothyroxine.   [place], [place] Will increase Mirtazapine 169mqd in setting of weight loss    Family/ staff Communication: plan of care reviewed with the patient and charge nurse.   Labs/tests ordered: CBC/diff, CMP/eGFR  Time spend 35 minutes.

## 2019-12-24 NOTE — Assessment & Plan Note (Signed)
ED CXR RLL PNA, placed on Doxycycline 100mg  bid x 5 days.

## 2019-12-24 NOTE — Assessment & Plan Note (Signed)
Persisted, due to limited oral intake as result of dysphagia and thickened liquids. The patient declined feeding tube option if condition worsens, will update CBC/diff, CMP/eGFR

## 2019-12-24 NOTE — Assessment & Plan Note (Signed)
Anemia, 12/21/19 ED, Hgb 8.4, low Iron 19, continue Iron daily, no GI Bleeding, may be GI consult. Update CBC/diff.

## 2019-12-24 NOTE — Assessment & Plan Note (Signed)
Will increase Mirtazapine 15mg  qd in setting of weight loss

## 2019-12-24 NOTE — Assessment & Plan Note (Signed)
Blood pressure is controlled, continue Metoprolol. 

## 2019-12-25 ENCOUNTER — Encounter: Payer: Self-pay | Admitting: Nurse Practitioner

## 2019-12-25 ENCOUNTER — Non-Acute Institutional Stay (SKILLED_NURSING_FACILITY): Payer: Medicare Other | Admitting: Nurse Practitioner

## 2019-12-25 DIAGNOSIS — R279 Unspecified lack of coordination: Secondary | ICD-10-CM | POA: Diagnosis not present

## 2019-12-25 DIAGNOSIS — Z5181 Encounter for therapeutic drug level monitoring: Secondary | ICD-10-CM | POA: Diagnosis not present

## 2019-12-25 DIAGNOSIS — D509 Iron deficiency anemia, unspecified: Secondary | ICD-10-CM

## 2019-12-25 DIAGNOSIS — J189 Pneumonia, unspecified organism: Secondary | ICD-10-CM | POA: Diagnosis not present

## 2019-12-25 DIAGNOSIS — E039 Hypothyroidism, unspecified: Secondary | ICD-10-CM

## 2019-12-25 DIAGNOSIS — R2689 Other abnormalities of gait and mobility: Secondary | ICD-10-CM | POA: Diagnosis not present

## 2019-12-25 DIAGNOSIS — I10 Essential (primary) hypertension: Secondary | ICD-10-CM | POA: Diagnosis not present

## 2019-12-25 DIAGNOSIS — I63112 Cerebral infarction due to embolism of left vertebral artery: Secondary | ICD-10-CM | POA: Diagnosis not present

## 2019-12-25 DIAGNOSIS — Z13228 Encounter for screening for other metabolic disorders: Secondary | ICD-10-CM | POA: Diagnosis not present

## 2019-12-25 DIAGNOSIS — M6281 Muscle weakness (generalized): Secondary | ICD-10-CM | POA: Diagnosis not present

## 2019-12-25 DIAGNOSIS — R6889 Other general symptoms and signs: Secondary | ICD-10-CM | POA: Diagnosis not present

## 2019-12-25 DIAGNOSIS — R55 Syncope and collapse: Secondary | ICD-10-CM | POA: Diagnosis not present

## 2019-12-25 DIAGNOSIS — I744 Embolism and thrombosis of arteries of extremities, unspecified: Secondary | ICD-10-CM | POA: Diagnosis not present

## 2019-12-25 DIAGNOSIS — F339 Major depressive disorder, recurrent, unspecified: Secondary | ICD-10-CM | POA: Diagnosis not present

## 2019-12-25 DIAGNOSIS — I480 Paroxysmal atrial fibrillation: Secondary | ICD-10-CM

## 2019-12-25 DIAGNOSIS — I69354 Hemiplegia and hemiparesis following cerebral infarction affecting left non-dominant side: Secondary | ICD-10-CM | POA: Diagnosis not present

## 2019-12-25 DIAGNOSIS — K5901 Slow transit constipation: Secondary | ICD-10-CM

## 2019-12-25 DIAGNOSIS — R1312 Dysphagia, oropharyngeal phase: Secondary | ICD-10-CM

## 2019-12-25 DIAGNOSIS — I639 Cerebral infarction, unspecified: Secondary | ICD-10-CM | POA: Diagnosis not present

## 2019-12-25 DIAGNOSIS — Z7901 Long term (current) use of anticoagulants: Secondary | ICD-10-CM | POA: Diagnosis not present

## 2019-12-25 LAB — BASIC METABOLIC PANEL
BUN: 18 (ref 4–21)
CO2: 28 — AB (ref 13–22)
Chloride: 103 (ref 99–108)
Creatinine: 0.6 (ref 0.6–1.3)
Glucose: 129
Potassium: 3.7 (ref 3.4–5.3)
Sodium: 139 (ref 137–147)

## 2019-12-25 LAB — HEPATIC FUNCTION PANEL
ALT: 9 — AB (ref 10–40)
AST: 23 (ref 14–40)
Alkaline Phosphatase: 75 (ref 25–125)
Bilirubin, Total: 0.5

## 2019-12-25 LAB — IRON,TIBC AND FERRITIN PANEL
%SAT: 9
Ferritin: 779
Iron: 11
TIBC: 124

## 2019-12-25 LAB — CBC AND DIFFERENTIAL
HCT: 23 — AB (ref 41–53)
Hemoglobin: 7.4 — AB (ref 13.5–17.5)
Neutrophils Absolute: 5886
Platelets: 242 (ref 150–399)
WBC: 6.9

## 2019-12-25 LAB — COMPREHENSIVE METABOLIC PANEL
Albumin: 2.4 — AB (ref 3.5–5.0)
Calcium: 8.2 — AB (ref 8.7–10.7)
GFR calc Af Amer: 90
GFR calc non Af Amer: 78
Globulin: 3.4

## 2019-12-25 LAB — VITAMIN B12: Vitamin B-12: 572

## 2019-12-25 LAB — CBC: RBC: 2.68 — AB (ref 3.87–5.11)

## 2019-12-25 NOTE — Assessment & Plan Note (Signed)
Stable, continue Senokot S, MiraLax, Colace.

## 2019-12-25 NOTE — Assessment & Plan Note (Signed)
Mirtazapine was increased yesterday, too soon to eval, will continue Mirtazapine, observe.

## 2019-12-25 NOTE — Assessment & Plan Note (Signed)
persisted limited oral intake, weight loss, dysphagia, generalized weakness. Dysphagia, takes thickened liquids.S/p MBSS, oral intake is limited due to disliking thickened liquids.

## 2019-12-25 NOTE — Assessment & Plan Note (Signed)
S/p CVA left sided weakness, MRI showed right subcortical infarct mostly likely embolic nature.

## 2019-12-25 NOTE — Assessment & Plan Note (Signed)
Complete Doxycycline.

## 2019-12-25 NOTE — Assessment & Plan Note (Addendum)
Blood pressure is controlled, continue Metoprolol. 

## 2019-12-25 NOTE — Assessment & Plan Note (Signed)
Anemia, 12/21/19 ED, Hgb 8.4, low Iron 19, repeated Hgb 8.6, Iron 11 at [facility],  taking  Iron daily, no GI Bleeding, may be GI consult.

## 2019-12-25 NOTE — Assessment & Plan Note (Signed)
Stable, continue Levothyroxine, TSH 3.939 09/12/19

## 2019-12-25 NOTE — Progress Notes (Signed)
Location:    [facility] Room Number: 39 Place of Service:  SNF (31) Provider: Lennie Odor Monroe Toure NP  [name], MD  Patient Care Team: [name], MD as PCP - General (Internal Medicine) [name], MD (Ophthalmology) [name], MD (Orthopedic Surgery) [name], MD (Cardiology) [name], MD (General Surgery) [name], MD [facility] Medicine)  Extended Emergency Contact Information Primary Emergency Contact: Dragos,[name] Address: [place], [place] [address] [name] of [place] Phone: [phone] Mobile Phone: [phone] Relation: Spouse Secondary Emergency Contact: Renne, Platts Mobile Phone: [phone] Relation: Son  Code Status:  Full Code Goals of care: Advanced Directive information Advanced Directives 12/24/2019  Does Patient Have a Medical Advance Directive? Yes  Type of Paramedic of [place];Living will  Does patient want to make changes to medical advance directive? No - Patient declined  Copy of Jette in Chart? Yes - validated most recent copy scanned in chart (See row information)  Would patient like information on creating a medical advance directive? -     Chief Complaint  Patient presents with  . Acute Visit    2 episodes of transient unresponsiveness while on commode    HPI:  Pt is a 86 y.o. male seen today for an acute visit for the patient's CNA reported 2 episodes of unresponsiveness while on commode Sunday and today Tuesday. Stated the patient eyes rolled up, legs involuntary movement, lasted 3-4 seconds, returned to consciousness after his name called 2nd and 3rd time. No Vital signs or documented focal weakness in addition to his left pare lysis. The patient didn't have recollection of the events.    persisted limited oral intake, weight loss, dysphagia, generalized weakness. Dysphagia, takes thickened liquids.S/p  MBSS, oral intake is limited due to disliking thickened liquids.             Anemia, 12/21/19 ED, Hgb 8.4, low Iron 19, repeated Hgb 8.6, Iron 11 at [facility],  taking  Iron daily, no GI Bleeding, may be GI consult.              Also in ED CXR RLL PNA, placed on Doxycycline 100mg  bid x 5 days.  S/p CVA left sided weakness, MRI showed right subcortical infarct mostly likely embolic nature.  HTN, takes Metoprolol qd. Afib, heart rate is in control. Takes Eliquis, Metoprolol. Constipation, takes Senokot S, MiraLax, Colace.  Hypothyroidism, takes Levothyroxine, TSH 3.939 09/12/19 Mood/appetite/sleep, takes Mirtazapine.weight loss about #16Ibs in the past month.                  Past Medical History:  Diagnosis Date  . AF (atrial fibrillation) [facility]    a. s/p RFCA/PVI @ [facility] clinic 2007.  . Arthritis    "joints" (06/21/2013)  . Atrial flutter ([place])    a. 06/2013 s/p TEE/DCCV  . Chronic systolic CHF (congestive heart failure) ([place])    a. 06/2013 Echo: EF 20%, diff HK, mild LVH.  Marland Kitchen History of cardiovascular stress test    Lexiscan Myoview 8/16:  EF 59%, attenuation artifact, no ischemia; Low Risk  . Hyperlipidemia   . Hypertension   . HYPERTENSION, BENIGN 08/21/2009   Qualifier: Diagnosis of  By: Caryl Comes, MD, Remus Blake  Medications(s)  enalapril   . Hypothyroidism   . Neoplasm of uncertain behavior    SKIN  . Polymyalgia rheumatica ([place]) 01/13/2007   Qualifier:  History of  By: Danny Lawless CMA, [place]     . Presence of permanent cardiac pacemaker    Past Surgical History:  Procedure Laterality Date  . ATRIAL FIBRILLATION ABLATION  2007  . CARDIOVERSION N/A 06/22/2013   Procedure: CARDIOVERSION;  Surgeon: Sueanne Margarita, MD;  Location: [facility] ENDOSCOPY;  Service: Cardiovascular;  Laterality: N/A;  15:17 Lido 20mg ,IV , Propofol 30 mg, IV synched cardioversion @ 150 joules by Dr. Lajoyce Lauber from a flutter to sinis brady  which is baseline for this pt, reports 45-50 reg HR verified by Dr. Ashok Norris  . CARDIOVERSION N/A 11/21/2014   Procedure: CARDIOVERSION;  Surgeon: Fay Records, MD;  Location: [facility] ENDOSCOPY;  Service: Cardiovascular;  Laterality: N/A;  . CARDIOVERSION N/A 07/14/2015   Procedure: CARDIOVERSION;  Surgeon: Sueanne Margarita, MD;  Location: [facility];  Service: Cardiovascular;  Laterality: N/A;  . CATARACT EXTRACTION, BILATERAL Bilateral Summer 2009  . CHOLECYSTECTOMY    . EP IMPLANTABLE DEVICE N/A 11/10/2015   Procedure: Pacemaker Implant;  Surgeon: [name], MD;  Location: [place] CV LAB;  Service: Cardiovascular;  Laterality: N/A;  . HAND SURGERY Right    "broke bone; grafted area w/bone from somewhere else"  . HEMORRHOID SURGERY    . INGUINAL HERNIA REPAIR Bilateral   . TEE WITHOUT CARDIOVERSION N/A 06/22/2013   Procedure: TRANSESOPHAGEAL ECHOCARDIOGRAM (TEE);  Surgeon: Sueanne Margarita, MD;  Location: [facility] ENDOSCOPY;  Service: Cardiovascular;  Laterality: N/A;  . TEE WITHOUT CARDIOVERSION N/A 07/14/2015   Procedure: TRANSESOPHAGEAL ECHOCARDIOGRAM (TEE);  Surgeon: Sueanne Margarita, MD;  Location: [facility] ENDOSCOPY;  Service: Cardiovascular;  Laterality: N/A;  . TONSILLECTOMY      No Known Allergies  Allergies as of 12/25/2019   No Known Allergies     Medication List       Accurate as of December 25, 2019 11:59 PM. If you have any questions, ask your nurse or doctor.        amoxicillin-clavulanate 875-125 MG tablet Commonly known as: AUGMENTIN Take 1 tablet by mouth 2 (two) times daily. What changed: Another medication with the same name was removed. Continue taking this medication, and follow the directions you see here. Changed by: Pawel Soules X Lakira Ogando, NP   antiseptic oral rinse Liqd 20 mLs by Mouth Rinse route as needed for dry mouth.   atorvastatin 40 MG tablet Commonly known as: LIPITOR Take 40 mg by mouth daily.   carboxymethylcellulose 0.5 % Soln Commonly known as: REFRESH PLUS 1  drop every 4 (four) hours as needed. Each eye   docusate sodium 100 MG capsule Commonly known as: COLACE Take 200 mg by mouth daily.   doxycycline 100 MG capsule Commonly known as: VIBRAMYCIN Take 1 capsule (100 mg total) by mouth 2 (two) times daily.   Eliquis 5 MG Tabs tablet Generic drug: apixaban Take 5 mg by mouth 2 (two) times daily.   ferrous sulfate 325 (65 FE) MG EC tablet Take 325 mg by mouth daily.   glucosamine-chondroitin 500-400 MG tablet Take 1 tablet by mouth 3 (three) times daily.   levothyroxine 50 MCG tablet Commonly known as: SYNTHROID Take 1 tablet (50 mcg total) by mouth daily before breakfast.   mirtazapine 15 MG tablet Commonly known as: REMERON Take 7.5 mg by mouth at bedtime.   multivitamin with minerals Tabs tablet Take 1 tablet by mouth daily. Centrum   polyethylene glycol 17 g packet Commonly known as: MIRALAX / GLYCOLAX Take 17 g by mouth daily. 17 gram/dose, oral, Once A  Morning, Miralax 17 gm PO QD. Hold for diarrhea.   RESOURCE 2.0 PO Take by mouth in the morning, at noon, and at bedtime.   sennosides-docusate sodium 8.6-50 MG tablet Commonly known as: SENOKOT-S Take 2 tablets by mouth daily.       Review of Systems  Constitutional: Positive for appetite change, fatigue and unexpected weight change. Negative for fever.       Weight loss about #16Ibs in the past month.   HENT: Positive for hearing loss and trouble swallowing. Negative for congestion and voice change.   Eyes: Negative for visual disturbance.  Respiratory: Positive for cough. Negative for shortness of breath.        Phlegm   Cardiovascular: Negative for palpitations and leg swelling.  Gastrointestinal: Negative for abdominal pain and constipation.  Genitourinary: Positive for frequency. Negative for dysuria and urgency.  Musculoskeletal: Positive for gait problem.  Skin: Positive for pallor.  Neurological: Positive for syncope, facial asymmetry and weakness.  Negative for speech difficulty and light-headedness.  Psychiatric/Behavioral: Negative for behavioral problems and sleep disturbance. The patient is not nervous/anxious.     Immunization History  Administered Date(s) Administered  . Influenza Split 01/25/2012  . Influenza Whole 02/08/2008, 03/12/2009, 01/16/2010  . Influenza, High Dose Seasonal PF 01/24/2015, 03/04/2017, 02/16/2018, 12/29/2018  . Influenza,inj,Quad PF,6+ Mos 01/25/2013, 01/23/2014  . Influenza-Unspecified 01/02/2016  . PFIZER SARS-COV-2 Vaccination 05/22/2019, 06/12/2019  . Pneumococcal Conjugate-13 01/25/2013  . Pneumococcal Polysaccharide-23 05/28/2016  . Td 01/16/2010  . Tdap 05/06/2019   Pertinent  Health Maintenance Due  Topic Date Due  . INFLUENZA VACCINE  12/02/2019  . PNA vac Low Risk Adult  Completed   Fall Risk  03/23/2019 03/17/2018 02/16/2018 05/28/2016 04/08/2016  Falls in the past year? 0 1 No No No  Comment [facility] Telephone Survey: data to providers prior to load [employer] Telephone Survey: data to providers prior to load - - [facility] Telephone Survey: data to providers prior to load  Number falls in past yr: - 1 - - -  Comment - [facility] Telephone Survey Actual Response = 2 - - -  Injury with Fall? - 1 - - -   Functional Status Survey:    Vitals:   12/25/19 1528  BP: 140/66  Pulse: 75  Resp: 20  Temp: 97.6 F (36.4 C)  SpO2: 94%  Weight: 126 lb 9.6 oz (57.4 kg)  Height: 5\' 9"  (1.753 m)   Body mass index is 18.7 kg/m. Physical Exam Vitals and nursing note reviewed.  Constitutional:      Comments: Appears tired.   HENT:     Head: Normocephalic and atraumatic.     Mouth/Throat:     Mouth: Mucous membranes are dry.  Eyes:     General: No scleral icterus.    Extraocular Movements: Extraocular movements intact.     Conjunctiva/sclera: Conjunctivae normal.     Pupils: Pupils are equal, round, and reactive to light.  Cardiovascular:     Rate and Rhythm: Normal rate and regular rhythm.     Heart  sounds: No murmur heard.      Comments: Left chest pace maker Pulmonary:     Effort: Pulmonary effort is normal.     Breath sounds: Rales present. No wheezing or rhonchi.     Comments: Mostly left mid to lower lungs with deep breathing Abdominal:     General: Bowel sounds are normal.     Palpations: Abdomen is soft.     Tenderness: There is no abdominal tenderness.  Musculoskeletal:     Cervical back: Normal range of motion and neck supple.     Right lower leg: No edema.     Left lower leg: No edema.  Skin:    General: Skin is warm and dry.     Coloration: Skin is pale.  Neurological:     Mental Status: He is alert and oriented to person, place, and time. Mental status is at baseline.     Cranial Nerves: Cranial nerve deficit present.     Motor: Weakness present.     Coordination: Coordination abnormal.     Gait: Gait abnormal.     Comments: LUE, LLE weakness with muscle strength 5/5  Psychiatric:        Mood and Affect: Mood normal.        Behavior: Behavior normal.        Thought Content: Thought content normal.        Judgment: Judgment normal.     Labs reviewed: Recent Labs    09/09/19 1138 09/10/19 0621 09/13/19 0454 09/13/19 0454 09/14/19 0301 09/27/19 0000 12/21/19 1741  NA 141   < > 140   < > 141 142 138  K 4.5   < > 3.9   < > 4.2 4.1 4.0  CL 104   < > 106   < > 107 108 102  CO2 28   < > 26   < > 25 26* 27  GLUCOSE 104*   < > 109*  --  118*  --  104*  BUN 16   < > 33*   < > 33* 33* 17  CREATININE 0.98   < > 1.26*   < > 1.14 1.0 0.68  CALCIUM 9.3   < > 8.8*   < > 8.9 9.2 8.5*  MG 2.0  --   --   --   --   --   --    < > = values in this interval not displayed.   Recent Labs    05/07/19 0512 05/07/19 0512 09/09/19 1138 09/27/19 0000 12/21/19 1741  AST 31   < > 21 17 31   ALT 8   < > 10 6* 13  ALKPHOS 59   < > 72 84 75  BILITOT 1.3*  --  1.0  --  0.7  PROT 6.0*  --  6.6  --  7.3  ALBUMIN 3.4*   < > 3.8 3.9 2.5*   < > = values in this interval not  displayed.   Recent Labs    09/09/19 1138 09/10/19 0621 09/13/19 0454 09/13/19 0454 09/14/19 0301 09/27/19 0000 12/21/19 1741  WBC 4.2   < > 5.6   < > 5.4 6.2 7.3  NEUTROABS 3.0  --   --   --   --  4,700 6.2  HGB 13.4   < > 12.6*   < > 12.9* 13.8 8.4*  HCT 42.2   < > 38.4*   < > 39.3 41 27.5*  MCV 95.3   < > 93.9  --  94.0  --  93.2  PLT 134*   < > 128*   < > 133* 186 218   < > = values in this interval not displayed.   Lab Results  Component Value Date   TSH 3.939 09/12/2019   Lab Results  Component Value Date   HGBA1C 5.5 09/10/2019   Lab Results  Component Value Date   CHOL 133 09/10/2019   HDL 41  09/10/2019   LDLCALC 77 09/10/2019   TRIG 73 09/10/2019   CHOLHDL 3.2 09/10/2019    Significant Diagnostic Results in last 30 days:  Leanne Chang OP MEDICARE SPEECH PATH  Result Date: 12/14/2019 Objective Swallowing Evaluation: Type of Study: MBS-Modified Barium Swallow Study  Patient Details Name: [name] MRN: [id number] Date of Birth: [birth year] Today's Date: 12/14/2019 Time: SLP Start Time (ACUTE ONLY): 1315 -SLP Stop Time (ACUTE ONLY): 1401 SLP Time Calculation (min) (ACUTE ONLY): 46 min Past Medical History: Past Medical History: Diagnosis Date . AF (atrial fibrillation) [facility])   a. s/p RFCA/PVI @ [facility] clinic 2007. . Arthritis   "joints" (06/21/2013) . Atrial flutter ([place])   a. 06/2013 s/p TEE/DCCV . Chronic systolic CHF (congestive heart failure) ([place])   a. 06/2013 Echo: EF 20%, diff HK, mild LVH. Marland Kitchen History of cardiovascular stress test   Lexiscan Myoview 8/16:  EF 59%, attenuation artifact, no ischemia; Low Risk . Hyperlipidemia  . Hypertension  . HYPERTENSION, BENIGN 08/21/2009  Qualifier: Diagnosis of  By: Caryl Comes, MD, Remus Blake  Medications(s)  enalapril  . Hypothyroidism  . Neoplasm of uncertain behavior   SKIN . Polymyalgia rheumatica ([place]) 01/13/2007  Qualifier: History of  By: Danny Lawless CMA, [place]    . Presence of permanent cardiac pacemaker  Past  Surgical History: Past Surgical History: Procedure Laterality Date . ATRIAL FIBRILLATION ABLATION  2007 . CARDIOVERSION N/A 06/22/2013  Procedure: CARDIOVERSION;  Surgeon: Sueanne Margarita, MD;  Location: [facility] ENDOSCOPY;  Service: Cardiovascular;  Laterality: N/A;  15:17 Lido 20mg ,IV , Propofol 30 mg, IV synched cardioversion @ 150 joules by Dr. Lajoyce Lauber from a flutter to sinis brady which is baseline for this pt, reports 45-50 reg HR verified by Dr. Ashok Norris . CARDIOVERSION N/A 11/21/2014  Procedure: CARDIOVERSION;  Surgeon: Fay Records, MD;  Location: [facility] ENDOSCOPY;  Service: Cardiovascular;  Laterality: N/A; . CARDIOVERSION N/A 07/14/2015  Procedure: CARDIOVERSION;  Surgeon: Sueanne Margarita, MD;  Location: [place];  Service: Cardiovascular;  Laterality: N/A; . CATARACT EXTRACTION, BILATERAL Bilateral Summer 2009 . CHOLECYSTECTOMY   . EP IMPLANTABLE DEVICE N/A 11/10/2015  Procedure: Pacemaker Implant;  Surgeon: [name], MD;  Location: [place] CV LAB;  Service: Cardiovascular;  Laterality: N/A; . HAND SURGERY Right   "broke bone; grafted area w/bone from somewhere else" . HEMORRHOID SURGERY   . INGUINAL HERNIA REPAIR Bilateral  . TEE WITHOUT CARDIOVERSION N/A 06/22/2013  Procedure: TRANSESOPHAGEAL ECHOCARDIOGRAM (TEE);  Surgeon: Sueanne Margarita, MD;  Location: [facility] ENDOSCOPY;  Service: Cardiovascular;  Laterality: N/A; . TEE WITHOUT CARDIOVERSION N/A 07/14/2015  Procedure: TRANSESOPHAGEAL ECHOCARDIOGRAM (TEE);  Surgeon: Sueanne Margarita, MD;  Location: [facility] ENDOSCOPY;  Service: Cardiovascular;  Laterality: N/A; . TONSILLECTOMY   HPI: HPI: 85 yo male [facility] with weakness, dysarthria and fall with concern for acute CVA.  Pt CT scan head showed chronic lacunar CVAs x2 basal ganglia, small right cerebellar CVA.  MRI showed insular CVA. Concern for worsening symptoms, so new CT has been ordered to r/o extension.  Pt PMH + for HTN, hypothyroidism.    Pt with recent pna in July 2021.  He is being referred for repeat  MBS to determine Least restrictive diet and to objectively evaluate swallowing.   Subjective: pt awake in chair Assessment / Plan / Recommendation CHL IP CLINICAL IMPRESSIONS 12/14/2019 Clinical Impression Patient presents with ongoing moderate oropharyngeal dysphagia with aspiration of thin liquids due to decreased timing of and adequacy of laryngeal elevation/closure.  Aspiration was  sensed with reflexive cough noted but cough did not clear aspirates.  Oral control of barium was decreased due to pt's lingual weakness which results in premature spillage and decreased propulsion.  Piecemealing noted intermittently which was marginally delayed with pudding/nectar.   Pharyngeal swallow was inconsistent in performance, at times stronger and more efficient than others.  With cues for "fast and hard swallow" and chin tuck posture, pt able to protect his airway with nectar, pudding, cracker.   However despite use of chin tuck, pt still aspirated with thin when retained pudding barium noted in pharynx.  Single tsps thin with head neutral were not aspirated.  Of note, if pt could elicit swallow timing before head of bolus reached over epiglottis or pyriform sinus, he did not aspirate with thin. As, penetration and aspiration of thin occur due to decreased timing and adequacy of laryngeal closure.   Thin barium pooling at pyriform sinus before swallow triggered spilled into larynx/trachea with swallow initiation.  On the 15th and final bolus of the test *nectar*, pt noted to have swallow trigger at pyriform sinus at 2 seconds- oral residuals/piecemealed nectar bolus triggered at SEVEN secondary at pyriform sinus with pt needing verbal cues to swallow.  Suspect initiation difficulties are likely from his right frontal lobe cva impacting initiation and cerebellar/basal ganglia CVA contribute to dysphagia.  Pt does present with cough with all aspiration episodes, thus advancing diet clinically may be feasible.  Pt verbalized to  this SLP that he "does not like nor want to use the thickened drinks" and he further states "I've had pneumonia anyway" indicating this to be true despite following dietary restrictions.   Aspiration mitigation strategies in place but given pt's inconsisent swallow performance, barium retention and comorbidities, he will be chronic aspiration/aspiration pna risk.  Given pt did not aspirate with thin via tsp, provided him with a Provale cup to use for thins.  Advise addressing pt's wishes for dietary advancement with MDs, NPs, family and pt especially given his advanced age, comorbidities and wishes.  Recommend continue postures of chin tuck and effortful swallows.  Strengthening tongue base retraction and laryngeal elevation advised.  Pt informed to maintain strength of "hock" to clear pharynx/valleculae and cough for airway clearance. He reported understanding to test results/compensation strategies and use of Provale cup.  Pt also would benefit from [employer] Protocol to help him maximize hydration and give him QOL.  Thanks for this consult.   SLP Visit Diagnosis Dysphagia, oropharyngeal phase (R13.12) Attention and concentration deficit following -- Frontal lobe and executive function deficit following -- Impact on safety and function Mild aspiration risk;Moderate aspiration risk;Risk for inadequate nutrition/hydration   CHL IP TREATMENT RECOMMENDATION 09/10/2019 Treatment Recommendations Defer until completion of intrumental exam   Prognosis 09/12/2019 Prognosis for Safe Diet Advancement Fair Barriers to Reach Goals Severity of deficits Barriers/Prognosis Comment -- CHL IP DIET RECOMMENDATION 12/14/2019 SLP Diet Recommendations Dysphagia 3 (Mech soft) solids;Nectar thick liquid;Thin liquid Liquid Administration via Cup;Straw;Other (Comment) Medication Administration Other (Comment) crushed if large Compensations Slow rate;Small sips/bites;Follow solids with liquid;Effortful swallow;Chin tuck;Use straw to  facilitate chin tuck, intermittent dry swallow and intermittent "hock" and swallow or expectorate Postural Changes Remain semi-upright after after feeds/meals (Comment);Seated upright at 90 degrees   CHL IP OTHER RECOMMENDATIONS 12/14/2019 Recommended Consults (No Data) Oral Care Recommendations -- Other Recommendations --   CHL IP FOLLOW UP RECOMMENDATIONS 09/13/2019 Follow up Recommendations Skilled Nursing facility   [facility] IP FREQUENCY AND DURATION 09/12/2019 Speech Therapy Frequency (ACUTE ONLY) min  2x/week Treatment Duration 1 week      CHL IP ORAL PHASE 12/14/2019 Oral Phase Impaired Oral - Pudding Teaspoon -- Oral - Pudding Cup -- Oral - Honey Teaspoon -- Oral - Honey Cup -- Oral - Nectar Teaspoon Weak lingual manipulation;Reduced posterior propulsion;Decreased bolus cohesion;Premature spillage Oral - Nectar Cup Weak lingual manipulation;Reduced posterior propulsion;Piecemeal swallowing;Decreased bolus cohesion;Premature spillage Oral - Nectar Straw Weak lingual manipulation;Reduced posterior propulsion;Piecemeal swallowing;Decreased bolus cohesion;Premature spillage Oral - Thin Teaspoon Weak lingual manipulation;Reduced posterior propulsion;Premature spillage Oral - Thin Cup Weak lingual manipulation;Reduced posterior propulsion;Piecemeal swallowing;Decreased bolus cohesion;Premature spillage Oral - Thin Straw Weak lingual manipulation;Reduced posterior propulsion;Piecemeal swallowing;Decreased bolus cohesion;Premature spillage Oral - Puree Weak lingual manipulation;Reduced posterior propulsion Oral - Mech Soft Impaired mastication;Weak lingual manipulation;Reduced posterior propulsion Oral - Regular -- Oral - Multi-Consistency -- Oral - Pill -- Oral Phase - Comment --  CHL IP PHARYNGEAL PHASE 12/14/2019 Pharyngeal Phase Impaired Pharyngeal- Pudding Teaspoon -- Pharyngeal -- Pharyngeal- Pudding Cup -- Pharyngeal -- Pharyngeal- Honey Teaspoon -- Pharyngeal -- Pharyngeal- Honey Cup -- Pharyngeal -- Pharyngeal- Nectar  Teaspoon Delayed swallow initiation-pyriform sinuses Pharyngeal Material does not enter airway Pharyngeal- Nectar Cup Reduced epiglottic inversion;Reduced tongue base retraction;Pharyngeal residue - valleculae Pharyngeal Material does not enter airway Pharyngeal- Nectar Straw -- Pharyngeal -- Pharyngeal- Thin Teaspoon Delayed swallow initiation-vallecula;Reduced epiglottic inversion;Reduced laryngeal elevation;Reduced airway/laryngeal closure;Reduced tongue base retraction;Pharyngeal residue - valleculae Pharyngeal -- Pharyngeal- Thin Cup Reduced epiglottic inversion;Reduced laryngeal elevation;Reduced airway/laryngeal closure;Reduced tongue base retraction;Penetration/Aspiration during swallow;Pharyngeal residue - valleculae Pharyngeal Material enters airway, remains ABOVE vocal cords and not ejected out;Material enters airway, passes BELOW cords and not ejected out despite cough attempt by patient Pharyngeal- Thin Straw Delayed swallow initiation-vallecula;Reduced epiglottic inversion;Reduced laryngeal elevation;Pharyngeal residue - valleculae;Compensatory strategies attempted (with notebox) Pharyngeal -- Pharyngeal- Puree Delayed swallow initiation-vallecula;Reduced pharyngeal peristalsis;Reduced tongue base retraction;Pharyngeal residue - valleculae;Lateral channel residue Pharyngeal -- Pharyngeal- Mechanical Soft Delayed swallow initiation-vallecula;Reduced tongue base retraction;Pharyngeal residue - valleculae Pharyngeal -- Pharyngeal- Regular -- Pharyngeal -- Pharyngeal- Multi-consistency -- Pharyngeal -- Pharyngeal- Pill -- Pharyngeal -- Pharyngeal Comment --  CHL IP CERVICAL ESOPHAGEAL PHASE 12/14/2019 Cervical Esophageal Phase Impaired Pudding Teaspoon -- Pudding Cup -- Honey Teaspoon -- Honey Cup -- Nectar Teaspoon -- Nectar Cup -- Nectar Straw -- Thin Teaspoon -- Thin Cup -- Thin Straw -- Puree -- Mechanical Soft -- Regular -- Multi-consistency -- Pill -- Cervical Esophageal Comment -- Kathleen Lime, MS [facility] SLP  Acute Rehab Services Office [phone] [name] 12/14/2019, 3:17 PM  CLINICAL DATA:  Dysphagia. Recent pneumonia despite intake restrictions. EXAM: MODIFIED BARIUM SWALLOW TECHNIQUE: Different consistencies of barium were administered orally to the patient by the Speech Pathologist. Imaging of the pharynx was performed in the lateral projection. The radiologist was present in the fluoroscopy room for this study, providing personal supervision. FLUOROSCOPY TIME:  Fluoroscopy Time:  1 minutes 48 seconds Radiation Exposure Index (if provided by the fluoroscopic device): 3.1 mGy Number of Acquired Spot Images: 0 COMPARISON:  None. FINDINGS: With thin barium, there was aspiration into the cervical trachea with a cough reflex. This improved with the chin tucked and did not occur when only a teaspoon of thin liquid was swallowed at a time. There was also mild vallecular pooling. There was flash laryngeal penetration and mild vallecular pooling with nectar. Vallecular pooling was demonstrated with puree. This did not clear with subsequent swallows of nectar. Vallecular pooling was demonstrated with cracker. IMPRESSION: 1. Aspiration with thin liquid with a cough reflex. This improved with the chin tucked and did not  occur when only a teaspoon of thin liquid was swallowed at a time. 2. Flash laryngeal penetration and mild vallecular pooling with nectar. 3. Vallecular pulling with puree and cracker. Please refer to the Speech Pathologists report for complete details and recommendations. Electronically Signed   By: Claudie Revering M.D.   On: 12/14/2019 14:11   DG Chest Port 1 View  Result Date: 12/21/2019 CLINICAL DATA:  Lethargy, pneumonia diagnosis 1 week ago, productive cough EXAM: PORTABLE CHEST 1 VIEW COMPARISON:  09/10/2019 FINDINGS: Single frontal view of the chest demonstrates right lower lobe consolidation and trace right effusion compatible with known history of pneumonia. No pneumothorax. Left chest is  clear. Cardiac silhouette is unremarkable. Dual lead pacemaker is stable. IMPRESSION: 1. Right lower lobe pneumonia with trace right parapneumonic effusion. Electronically Signed   By: Randa Ngo M.D.   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 Stated the patient eyes rolled up, legs involuntary movement, lasted 3-4 seconds, returned to consciousness after his name called 2nd and 3rd time. No Vital signs or documented focal weakness in addition to his left pare lysis. The patient didn't have recollection of the events.      Vasovagal vs seizures vs cardiac etiology, the patient declined neurology consultation. Will obtain orthostatic Bp. Observe.   Dysphagia persisted limited oral intake, weight loss, dysphagia, generalized weakness. Dysphagia, takes thickened liquids.S/p MBSS, oral intake is limited due to disliking thickened liquids.   Pneumonia involving right lung Complete Doxycycline.   Iron deficiency anemia  Anemia, 12/21/19 ED, Hgb 8.4, low Iron 19, repeated Hgb 8.6, Iron 11 at [facility],  taking  Iron daily, no GI Bleeding, may be GI consult.   Essential hypertension Blood pressure is controlled, continue Metoprolol.    CVA (cerebral vascular accident) [facility]) S/p CVA left sided weakness, MRI showed right subcortical infarct mostly likely embolic nature.    PAF (paroxysmal atrial fibrillation) (HCC) Heart rate is controlled, continue Metoprolol, Eliquis.   Slow transit constipation Stable, continue Senokot S, MiraLax, Colace.   Hypothyroidism Stable, continue Levothyroxine, TSH 3.939 09/12/19   Depression, recurrent (HCC) Mirtazapine was increased yesterday, too soon to eval, will continue Mirtazapine, observe.     Family/ staff Communication: plan of care reviewed with the patient and charge nurse.   Labs/tests ordered: CBC done today  Time spend 35  minutes.

## 2019-12-25 NOTE — Assessment & Plan Note (Signed)
Heart rate is controlled, continue Metoprolol, Eliquis.

## 2019-12-25 NOTE — Assessment & Plan Note (Addendum)
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 Stated the patient eyes rolled up, legs involuntary movement, lasted 3-4 seconds, returned to consciousness after his name called 2nd and 3rd time. No Vital signs or documented focal weakness in addition to his left pare lysis. The patient didn't have recollection of the events.      Vasovagal vs seizures vs cardiac etiology, the patient declined neurology consultation. Will obtain orthostatic Bp. Observe.

## 2019-12-26 ENCOUNTER — Non-Acute Institutional Stay (SKILLED_NURSING_FACILITY): Payer: Medicare Other | Admitting: Internal Medicine

## 2019-12-26 ENCOUNTER — Emergency Department (HOSPITAL_COMMUNITY): Payer: Medicare Other

## 2019-12-26 ENCOUNTER — Encounter: Payer: Self-pay | Admitting: Internal Medicine

## 2019-12-26 ENCOUNTER — Other Ambulatory Visit: Payer: Self-pay

## 2019-12-26 ENCOUNTER — Encounter: Payer: Self-pay | Admitting: Nurse Practitioner

## 2019-12-26 ENCOUNTER — Emergency Department (HOSPITAL_COMMUNITY)
Admission: EM | Admit: 2019-12-26 | Discharge: 2019-12-27 | Disposition: A | Discharge status: Skilled Nursing Facility | Payer: Medicare Other | Attending: Emergency Medicine | Admitting: Emergency Medicine

## 2019-12-26 ENCOUNTER — Encounter (HOSPITAL_COMMUNITY): Payer: Self-pay

## 2019-12-26 DIAGNOSIS — D485 Neoplasm of uncertain behavior of skin: Secondary | ICD-10-CM | POA: Diagnosis not present

## 2019-12-26 DIAGNOSIS — R279 Unspecified lack of coordination: Secondary | ICD-10-CM | POA: Diagnosis not present

## 2019-12-26 DIAGNOSIS — Z79899 Other long term (current) drug therapy: Secondary | ICD-10-CM | POA: Diagnosis not present

## 2019-12-26 DIAGNOSIS — R2689 Other abnormalities of gait and mobility: Secondary | ICD-10-CM | POA: Diagnosis not present

## 2019-12-26 DIAGNOSIS — R42 Dizziness and giddiness: Secondary | ICD-10-CM | POA: Insufficient documentation

## 2019-12-26 DIAGNOSIS — Z950 Presence of cardiac pacemaker: Secondary | ICD-10-CM | POA: Insufficient documentation

## 2019-12-26 DIAGNOSIS — I5022 Chronic systolic (congestive) heart failure: Secondary | ICD-10-CM | POA: Diagnosis not present

## 2019-12-26 DIAGNOSIS — I63112 Cerebral infarction due to embolism of left vertebral artery: Secondary | ICD-10-CM

## 2019-12-26 DIAGNOSIS — D509 Iron deficiency anemia, unspecified: Secondary | ICD-10-CM | POA: Diagnosis not present

## 2019-12-26 DIAGNOSIS — J189 Pneumonia, unspecified organism: Secondary | ICD-10-CM

## 2019-12-26 DIAGNOSIS — E039 Hypothyroidism, unspecified: Secondary | ICD-10-CM | POA: Insufficient documentation

## 2019-12-26 DIAGNOSIS — I130 Hypertensive heart and chronic kidney disease with heart failure and stage 1 through stage 4 chronic kidney disease, or unspecified chronic kidney disease: Secondary | ICD-10-CM | POA: Diagnosis not present

## 2019-12-26 DIAGNOSIS — R0602 Shortness of breath: Secondary | ICD-10-CM | POA: Insufficient documentation

## 2019-12-26 DIAGNOSIS — R799 Abnormal finding of blood chemistry, unspecified: Secondary | ICD-10-CM | POA: Diagnosis present

## 2019-12-26 DIAGNOSIS — D649 Anemia, unspecified: Secondary | ICD-10-CM

## 2019-12-26 DIAGNOSIS — Z20822 Contact with and (suspected) exposure to covid-19: Secondary | ICD-10-CM | POA: Insufficient documentation

## 2019-12-26 DIAGNOSIS — R0689 Other abnormalities of breathing: Secondary | ICD-10-CM | POA: Diagnosis not present

## 2019-12-26 DIAGNOSIS — Z87891 Personal history of nicotine dependence: Secondary | ICD-10-CM | POA: Insufficient documentation

## 2019-12-26 DIAGNOSIS — R1312 Dysphagia, oropharyngeal phase: Secondary | ICD-10-CM | POA: Diagnosis not present

## 2019-12-26 DIAGNOSIS — R55 Syncope and collapse: Secondary | ICD-10-CM

## 2019-12-26 DIAGNOSIS — I639 Cerebral infarction, unspecified: Secondary | ICD-10-CM | POA: Diagnosis not present

## 2019-12-26 DIAGNOSIS — I10 Essential (primary) hypertension: Secondary | ICD-10-CM | POA: Diagnosis not present

## 2019-12-26 DIAGNOSIS — N183 Chronic kidney disease, stage 3 unspecified: Secondary | ICD-10-CM | POA: Insufficient documentation

## 2019-12-26 DIAGNOSIS — R531 Weakness: Secondary | ICD-10-CM | POA: Insufficient documentation

## 2019-12-26 DIAGNOSIS — I69354 Hemiplegia and hemiparesis following cerebral infarction affecting left non-dominant side: Secondary | ICD-10-CM | POA: Diagnosis not present

## 2019-12-26 DIAGNOSIS — R05 Cough: Secondary | ICD-10-CM | POA: Diagnosis not present

## 2019-12-26 DIAGNOSIS — M6281 Muscle weakness (generalized): Secondary | ICD-10-CM | POA: Diagnosis not present

## 2019-12-26 LAB — CBC WITH DIFFERENTIAL/PLATELET
Abs Immature Granulocytes: 0.05 10*3/uL (ref 0.00–0.07)
Basophils Absolute: 0 10*3/uL (ref 0.0–0.1)
Basophils Relative: 0 %
Eosinophils Absolute: 0 10*3/uL (ref 0.0–0.5)
Eosinophils Relative: 0 %
HCT: 28 % — ABNORMAL LOW (ref 39.0–52.0)
Hemoglobin: 8.5 g/dL — ABNORMAL LOW (ref 13.0–17.0)
Immature Granulocytes: 1 %
Lymphocytes Relative: 7 %
Lymphs Abs: 0.5 10*3/uL — ABNORMAL LOW (ref 0.7–4.0)
MCH: 28 pg (ref 26.0–34.0)
MCHC: 30.4 g/dL (ref 30.0–36.0)
MCV: 92.1 fL (ref 80.0–100.0)
Monocytes Absolute: 0.4 10*3/uL (ref 0.1–1.0)
Monocytes Relative: 5 %
Neutro Abs: 6 10*3/uL (ref 1.7–7.7)
Neutrophils Relative %: 87 %
Platelets: 259 10*3/uL (ref 150–400)
RBC: 3.04 MIL/uL — ABNORMAL LOW (ref 4.22–5.81)
RDW: 15.8 % — ABNORMAL HIGH (ref 11.5–15.5)
WBC: 7 10*3/uL (ref 4.0–10.5)
nRBC: 0 % (ref 0.0–0.2)

## 2019-12-26 LAB — URINALYSIS, ROUTINE W REFLEX MICROSCOPIC
Bilirubin Urine: NEGATIVE
Glucose, UA: NEGATIVE mg/dL
Hgb urine dipstick: NEGATIVE
Ketones, ur: 5 mg/dL — AB
Leukocytes,Ua: NEGATIVE
Nitrite: NEGATIVE
Protein, ur: 30 mg/dL — AB
Specific Gravity, Urine: 1.024 (ref 1.005–1.030)
pH: 5 (ref 5.0–8.0)

## 2019-12-26 LAB — COMPREHENSIVE METABOLIC PANEL
ALT: 13 U/L (ref 0–44)
AST: 31 U/L (ref 15–41)
Albumin: 2.3 g/dL — ABNORMAL LOW (ref 3.5–5.0)
Alkaline Phosphatase: 76 U/L (ref 38–126)
Anion gap: 9 (ref 5–15)
BUN: 20 mg/dL (ref 8–23)
CO2: 30 mmol/L (ref 22–32)
Calcium: 8.6 mg/dL — ABNORMAL LOW (ref 8.9–10.3)
Chloride: 101 mmol/L (ref 98–111)
Creatinine, Ser: 0.86 mg/dL (ref 0.61–1.24)
GFR calc Af Amer: >60 mL/min (ref >60)
GFR calc non Af Amer: >60 mL/min (ref >60)
Glucose, Bld: 165 mg/dL — ABNORMAL HIGH (ref 70–99)
Potassium: 3.7 mmol/L (ref 3.5–5.1)
Sodium: 140 mmol/L (ref 135–145)
Total Bilirubin: 0.7 mg/dL (ref 0.3–1.2)
Total Protein: 6.8 g/dL (ref 6.5–8.1)

## 2019-12-26 LAB — POC OCCULT BLOOD, ED: Fecal Occult Bld: NEGATIVE

## 2019-12-26 LAB — IRON AND TIBC
Iron: 17 ug/dL — ABNORMAL LOW (ref 45–182)
Saturation Ratios: 12 % — ABNORMAL LOW (ref 17.9–39.5)
TIBC: 137 ug/dL — ABNORMAL LOW (ref 250–450)
UIBC: 120 ug/dL

## 2019-12-26 LAB — RETICULOCYTES
Immature Retic Fract: 19.4 % — ABNORMAL HIGH (ref 2.3–15.9)
RBC.: 3.05 MIL/uL — ABNORMAL LOW (ref 4.22–5.81)
Retic Count, Absolute: 45.8 10*3/uL (ref 19.0–186.0)
Retic Ct Pct: 1.5 % (ref 0.4–3.1)

## 2019-12-26 LAB — SARS CORONAVIRUS 2 BY RT PCR (HOSPITAL ORDER, PERFORMED IN ~~LOC~~ HOSPITAL LAB): SARS Coronavirus 2: NEGATIVE

## 2019-12-26 LAB — VITAMIN B12: Vitamin B-12: 585 pg/mL (ref 180–914)

## 2019-12-26 LAB — FERRITIN: Ferritin: 902 ng/mL — ABNORMAL HIGH (ref 24–336)

## 2019-12-26 LAB — FOLATE: Folate: 25.4 ng/mL (ref >5.9)

## 2019-12-26 LAB — TYPE AND SCREEN
ABO/RH(D): A POS
Antibody Screen: NEGATIVE

## 2019-12-26 MED ORDER — SODIUM CHLORIDE 0.9 % IV BOLUS
500.0000 mL | Freq: Once | INTRAVENOUS | Status: AC
  Administered 2019-12-26: 500 mL via INTRAVENOUS

## 2019-12-26 NOTE — Progress Notes (Addendum)
Location:    [place] Room Number: 39 Place of Service:  SNF [phone]) Provider:  Veleta Miners MD  [name], MD  Patient Care Team: [name], MD as PCP - General (Internal Medicine) [name], MD (Ophthalmology) [name], MD (Orthopedic Surgery) [name], MD (Cardiology) [name], MD (General Surgery) [name], MD [facility] Medicine)  Extended Emergency Contact Information Primary Emergency Contact: Dyckman,[name] Address: [place], [place] [address] [name] of [place] Phone: [phone] Mobile Phone: [phone] Relation: Spouse Secondary Emergency Contact: Juandiego, Kolenovic Mobile Phone: [phone] Relation: Son  Code Status:  Full Code Goals of care: Advanced Directive information Advanced Directives 12/24/2019  Does Patient Have a Medical Advance Directive? Yes  Type of Paramedic of [place];Living will  Does patient want to make changes to medical advance directive? No - Patient declined  Copy of [place] in Chart? Yes - validated most recent copy scanned in chart (See row information)  Would patient like information on creating a medical advance directive? -     Chief Complaint  Patient presents with  . Acute Visit    Anemia    HPI:  Pt is a 86 y.o. male seen today for an acute visit for Acute Drop in HGB  Patient has history of Acute Right MCA infarct with left Sided weakness from 5/9-5/14.MRI scan showed right subcortical infarct most likely embolic etiology Patient has past medical history of hypertension, hypothyroidism, chronic diastolic CHF, permanent A. fib , traumatic ICH in January 2021 and Collesfracture of right radius, PPM implant.,History of PMR, hyperlipidemia Also Significant Weight loss of more then 20 lbs Recurent Pneumonia  Patient has dropped his Hgb from 13.8 in 5/27 to 7.4 today He stays Occult  negative. Eliquis is on Hold He  Is feeling Weak Dizzy  Continues to Cough and SOB He has been on 2 Antibiotics since his Last ED visit Patient also has Episode of Syncope yesterday.  Past Medical History:  Diagnosis Date  . AF (atrial fibrillation) [name])    a. s/p RFCA/PVI @ [facility] 2007.  . Arthritis    "joints" (06/21/2013)  . Atrial flutter ([place])    a. 06/2013 s/p TEE/DCCV  . Chronic systolic CHF (congestive heart failure) ([place])    a. 06/2013 Echo: EF 20%, diff HK, mild LVH.  Marland Kitchen History of cardiovascular stress test    Lexiscan Myoview 8/16:  EF 59%, attenuation artifact, no ischemia; Low Risk  . Hyperlipidemia   . Hypertension   . HYPERTENSION, BENIGN 08/21/2009   Qualifier: Diagnosis of  By: Caryl Comes, MD, Remus Blake  Medications(s)  enalapril   . Hypothyroidism   . Neoplasm of uncertain behavior    SKIN  . Polymyalgia rheumatica ([place]) 01/13/2007   Qualifier: History of  By: Danny Lawless CMA, [place]     . Presence of permanent cardiac pacemaker    Past Surgical History:  Procedure Laterality Date  . ATRIAL FIBRILLATION ABLATION  2007  . CARDIOVERSION N/A 06/22/2013   Procedure: CARDIOVERSION;  Surgeon: Sueanne Margarita, MD;  [facility] END[facility];  Service: Cardiovascular;  Laterality: N/A;  15:17 Lido 20mg ,IV , Propofol 30 mg, IV synched cardioversion @ 150 joules by Dr. Lajoyce Lauber from a flutter to sinis brady which is baseline for this pt, reports 45-50 reg HR verified by Dr. Ashok Norris  . CARDIOVERSION N/A 11/21/2014   Procedure: CARDIOVERSION;  Surgeon: Fay Records, MD;  Location: [facility] ENDOSCOPY;  Service: Cardiovascular;  Laterality: N/A;  . CARDIOVERSION N/A 07/14/2015   Procedure: CARDIOVERSION;  Surgeon: Sueanne Margarita, MD;  Location: [place];  Service: Cardiovascular;  Laterality: N/A;  . CATARACT EXTRACTION, BILATERAL Bilateral Summer 2009  . CHOLECYSTECTOMY    . EP IMPLANTABLE DEVICE N/A 11/10/2015   Procedure: Pacemaker Implant;  Surgeon: [name], MD;   Location: [place] CV LAB;  Service: Cardiovascular;  Laterality: N/A;  . HAND SURGERY Right    "broke bone; grafted area w/bone from somewhere else"  . HEMORRHOID SURGERY    . INGUINAL HERNIA REPAIR Bilateral   . TEE WITHOUT CARDIOVERSION N/A 06/22/2013   Procedure: TRANSESOPHAGEAL ECHOCARDIOGRAM (TEE);  Surgeon: Sueanne Margarita, MD;  Location: [facility] ENDOSCOPY;  Service: Cardiovascular;  Laterality: N/A;  . TEE WITHOUT CARDIOVERSION N/A 07/14/2015   Procedure: TRANSESOPHAGEAL ECHOCARDIOGRAM (TEE);  Surgeon: Sueanne Margarita, MD;  Location: [facility] ENDOSCOPY;  Service: Cardiovascular;  Laterality: N/A;  . TONSILLECTOMY      No Known Allergies  Allergies as of 12/26/2019   No Known Allergies     Medication List       Accurate as of December 26, 2019 10:47 AM. If you have any questions, ask your nurse or doctor.        amoxicillin-clavulanate 875-125 MG tablet Commonly known as: AUGMENTIN Take 1 tablet by mouth 2 (two) times daily.   antiseptic oral rinse Liqd 20 mLs by Mouth Rinse route as needed for dry mouth.   atorvastatin 40 MG tablet Commonly known as: LIPITOR Take 40 mg by mouth daily.   carboxymethylcellulose 0.5 % Soln Commonly known as: REFRESH PLUS 1 drop every 4 (four) hours as needed. Each eye   docusate sodium 100 MG capsule Commonly known as: COLACE Take 200 mg by mouth daily.   doxycycline 100 MG capsule Commonly known as: VIBRAMYCIN Take 1 capsule (100 mg total) by mouth 2 (two) times daily.   Eliquis 5 MG Tabs tablet Generic drug: apixaban Take 5 mg by mouth 2 (two) times daily.   ferrous sulfate 325 (65 FE) MG EC tablet Take 325 mg by mouth daily.   glucosamine-chondroitin 500-400 MG tablet Take 1 tablet by mouth 3 (three) times daily.   levothyroxine 50 MCG tablet Commonly known as: SYNTHROID Take 1 tablet (50 mcg total) by mouth daily before breakfast.   mirtazapine 15 MG tablet Commonly known as: REMERON Take 15 mg by mouth at bedtime.     multivitamin with minerals Tabs tablet Take 1 tablet by mouth daily. Centrum   polyethylene glycol 17 g packet Commonly known as: MIRALAX / GLYCOLAX Take 17 g by mouth daily. 17 gram/dose, oral, Once A Morning, Miralax 17 gm PO QD. Hold for diarrhea.   RESOURCE 2.0 PO Take by mouth in the morning, at noon, and at bedtime.   sennosides-docusate sodium 8.6-50 MG tablet Commonly known as: SENOKOT-S Take 2 tablets by mouth daily.       Review of Systems  Constitutional: Positive for activity change and unexpected weight change.  HENT: Positive for congestion.   Respiratory: Positive for cough and shortness of breath.   Cardiovascular: Negative.   Gastrointestinal: Negative.   Genitourinary: Negative.   Musculoskeletal: Positive for gait problem.  Skin: Negative.   Neurological: Positive for weakness.  Psychiatric/Behavioral: Negative.   All other systems reviewed and are negative.   Immunization History  Administered Date(s) Administered  . Influenza Split 01/25/2012  . Influenza Whole  02/08/2008, 03/12/2009, 01/16/2010  . Influenza, High Dose Seasonal PF 01/24/2015, 03/04/2017, 02/16/2018, 12/29/2018  . Influenza,inj,Quad PF,6+ Mos 01/25/2013, 01/23/2014  . Influenza-Unspecified 01/02/2016  . PFIZER SARS-COV-2 Vaccination 05/22/2019, 06/12/2019  . Pneumococcal Conjugate-13 01/25/2013  . Pneumococcal Polysaccharide-23 05/28/2016  . Td 01/16/2010  . Tdap 05/06/2019   Pertinent  Health Maintenance Due  Topic Date Due  . INFLUENZA VACCINE  12/02/2019  . PNA vac Low Risk Adult  Completed   Fall Risk  03/23/2019 03/17/2018 02/16/2018 05/28/2016 04/08/2016  Falls in the past year? 0 1 No No No  Comment [employer] Telephone Survey: data to providers prior to load [employer] Telephone Survey: data to providers prior to load - - [facility]i Telephone Survey: data to providers prior to load  Number falls in past yr: - 1 - - -  Comment - [employer]i Telephone Survey Actual Response = 2 - - -  Injury  with Fall? - 1 - - -   Functional Status Survey:    Vitals:   12/26/19 1039  BP: 127/84  Pulse: 82  Resp: 19  Temp: (!) 97.4 F (36.3 C)  SpO2: 95%  Weight: 126 lb 9.6 oz (57.4 kg)  Height: 5\' 9"  (1.753 m)   Body mass index is 18.7 kg/m. Physical Exam Vitals reviewed.  Constitutional:      Comments: Very Frail  HENT:     Head: Normocephalic.     Nose: Nose normal.     Mouth/Throat:     Mouth: Mucous membranes are moist.     Pharynx: Oropharynx is clear.  Eyes:     Pupils: Pupils are equal, round, and reactive to light.  Cardiovascular:     Rate and Rhythm: Normal rate and regular rhythm.     Pulses: Normal pulses.     Heart sounds: Murmur heard.   Pulmonary:     Effort: Pulmonary effort is normal.     Comments: Rales Bilateral Left more then Right Abdominal:     General: Abdomen is flat. Bowel sounds are normal.     Palpations: Abdomen is soft.  Musculoskeletal:     Cervical back: Neck supple.     Comments: Swelling in Left Arm  Skin:    General: Skin is warm.  Neurological:     Mental Status: He is alert.     Comments: Left UE weakness  Psychiatric:        Mood and Affect: Mood normal.        Thought Content: Thought content normal.     Labs reviewed: Recent Labs    09/09/19 1138 09/10/19 0621 09/13/19 0454 09/13/19 0454 09/14/19 0301 09/27/19 0000 12/21/19 1741  NA 141   < > 140   < > 141 142 138  K 4.5   < > 3.9   < > 4.2 4.1 4.0  CL 104   < > 106   < > 107 108 102  CO2 28   < > 26   < > 25 26* 27  GLUCOSE 104*   < > 109*  --  118*  --  104*  BUN 16   < > 33*   < > 33* 33* 17  CREATININE 0.98   < > 1.26*   < > 1.14 1.0 0.68  CALCIUM 9.3   < > 8.8*   < > 8.9 9.2 8.5*  MG 2.0  --   --   --   --   --   --    < > =  values in this interval not displayed.   Recent Labs    05/07/19 0512 05/07/19 0512 09/09/19 1138 09/27/19 0000 12/21/19 1741  AST 31   < > 21 17 31   ALT 8   < > 10 6* 13  ALKPHOS 59   < > 72 84 75  BILITOT 1.3*  --  1.0   --  0.7  PROT 6.0*  --  6.6  --  7.3  ALBUMIN 3.4*   < > 3.8 3.9 2.5*   < > = values in this interval not displayed.   Recent Labs    09/09/19 1138 09/10/19 0621 09/13/19 0454 09/13/19 0454 09/14/19 0301 09/27/19 0000 12/21/19 1741  WBC 4.2   < > 5.6   < > 5.4 6.2 7.3  NEUTROABS 3.0  --   --   --   --  4,700 6.2  HGB 13.4   < > 12.6*   < > 12.9* 13.8 8.4*  HCT 42.2   < > 38.4*   < > 39.3 41 27.5*  MCV 95.3   < > 93.9  --  94.0  --  93.2  PLT 134*   < > 128*   < > 133* 186 218   < > = values in this interval not displayed.   Lab Results  Component Value Date   TSH 3.939 09/12/2019   Lab Results  Component Value Date   HGBA1C 5.5 09/10/2019   Lab Results  Component Value Date   CHOL 133 09/10/2019   HDL 41 09/10/2019   LDLCALC 77 09/10/2019   TRIG 73 09/10/2019   CHOLHDL 3.2 09/10/2019    Significant Diagnostic Results in last 30 days:  Leanne Chang OP MEDICARE SPEECH PATH  Result Date: 12/14/2019 Objective Swallowing Evaluation: Type of Study: MBS-Modified Barium Swallow Study  Patient Details Name: TASHAN KREITZER MRN: [id number] Date of Birth: 10/31/1925 Today's Date: 12/14/2019 Time: SLP Start Time (ACUTE ONLY): 1315 -SLP Stop Time (ACUTE ONLY): 1401 SLP Time Calculation (min) (ACUTE ONLY): 46 min Past Medical History: Past Medical History: Diagnosis Date . AF (atrial fibrillation) [facility])   a. s/p RFCA/PVI @ [facility] 2007. . Arthritis   "joints" (06/21/2013) . Atrial flutter ([place])   a. 06/2013 s/p TEE/DCCV . Chronic systolic CHF (congestive heart failure) ([place])   a. 06/2013 Echo: EF 20%, diff HK, mild LVH. Marland Kitchen History of cardiovascular stress test   Lexiscan Myoview 8/16:  EF 59%, attenuation artifact, no ischemia; Low Risk . Hyperlipidemia  . Hypertension  . HYPERTENSION, BENIGN 08/21/2009  Qualifier: Diagnosis of  By: Caryl Comes, MD, Remus Blake  Medications(s)  enalapril  . Hypothyroidism  . Neoplasm of uncertain behavior   SKIN . Polymyalgia rheumatica ([place])  01/13/2007  Qualifier: History of  By: Danny Lawless CMA, [place]    . Presence of permanent cardiac pacemaker  Past Surgical History: Past Surgical History: Procedure Laterality Date . ATRIAL FIBRILLATION ABLATION  2007 . CARDIOVERSION N/A 06/22/2013  Procedure: CARDIOVERSION;  Surgeon: Sueanne Margarita, MD;  Location: [facility] ENDOSCOPY;  Service: Cardiovascular;  Laterality: N/A;  15:17 Lido 20mg ,IV , Propofol 30 mg, IV synched cardioversion @ 150 joules by Dr. Lajoyce Lauber from a flutter to sinis brady which is baseline for this pt, reports 45-50 reg HR verified by Dr. Ashok Norris . CARDIOVERSION N/A 11/21/2014  Procedure: CARDIOVERSION;  Surgeon: Fay Records, MD;  Location: [facility] ENDOSCOPY;  Service: Cardiovascular;  Laterality: N/A; . CARDIOVERSION N/A 07/14/2015  Procedure: CARDIOVERSION;  Surgeon: Sueanne Margarita,  MD;  Location: [place];  Service: Cardiovascular;  Laterality: N/A; . CATARACT EXTRACTION, BILATERAL Bilateral Summer 2009 . CHOLECYSTECTOMY   . EP IMPLANTABLE DEVICE N/A 11/10/2015  Procedure: Pacemaker Implant;  Surgeon: [name], MD;  Location: [place] CV LAB;  Service: Cardiovascular;  Laterality: N/A; . HAND SURGERY Right   "broke bone; grafted area w/bone from somewhere else" . HEMORRHOID SURGERY   . INGUINAL HERNIA REPAIR Bilateral  . TEE WITHOUT CARDIOVERSION N/A 06/22/2013  Procedure: TRANSESOPHAGEAL ECHOCARDIOGRAM (TEE);  Surgeon: Sueanne Margarita, MD;  Location: [facility] ENDOSCOPY;  Service: Cardiovascular;  Laterality: N/A; . TEE WITHOUT CARDIOVERSION N/A 07/14/2015  Procedure: TRANSESOPHAGEAL ECHOCARDIOGRAM (TEE);  Surgeon: Sueanne Margarita, MD;  Location: [facility] ENDOSCOPY;  Service: Cardiovascular;  Laterality: N/A; . TONSILLECTOMY   HPI: HPI: 86 yo male admitted to [facility] with weakness, dysarthria and fall with concern for acute CVA.  Pt CT scan head showed chronic lacunar CVAs x2 basal ganglia, small right cerebellar CVA.  MRI showed insular CVA. Concern for worsening symptoms, so new CT has been ordered to r/o  extension.  Pt PMH + for HTN, hypothyroidism.    Pt with recent pna in July 2021.  He is being referred for repeat MBS to determine Least restrictive diet and to objectively evaluate swallowing.   Subjective: pt awake in chair Assessment / Plan / Recommendation CHL IP CLINICAL IMPRESSIONS 12/14/2019 Clinical Impression Patient presents with ongoing moderate oropharyngeal dysphagia with aspiration of thin liquids due to decreased timing of and adequacy of laryngeal elevation/closure.  Aspiration was sensed with reflexive cough noted but cough did not clear aspirates.  Oral control of barium was decreased due to pt's lingual weakness which results in premature spillage and decreased propulsion.  Piecemealing noted intermittently which was marginally delayed with pudding/nectar.   Pharyngeal swallow was inconsistent in performance, at times stronger and more efficient than others.  With cues for "fast and hard swallow" and chin tuck posture, pt able to protect his airway with nectar, pudding, cracker.   However despite use of chin tuck, pt still aspirated with thin when retained pudding barium noted in pharynx.  Single tsps thin with head neutral were not aspirated.  Of note, if pt could elicit swallow timing before head of bolus reached over epiglottis or pyriform sinus, he did not aspirate with thin. As, penetration and aspiration of thin occur due to decreased timing and adequacy of laryngeal closure.   Thin barium pooling at pyriform sinus before swallow triggered spilled into larynx/trachea with swallow initiation.  On the 15th and final bolus of the test *nectar*, pt noted to have swallow trigger at pyriform sinus at 2 seconds- oral residuals/piecemealed nectar bolus triggered at SEVEN secondary at pyriform sinus with pt needing verbal cues to swallow.  Suspect initiation difficulties are likely from his right frontal lobe cva impacting initiation and cerebellar/basal ganglia CVA contribute to dysphagia.  Pt does  present with cough with all aspiration episodes, thus advancing diet clinically may be feasible.  Pt verbalized to this SLP that he "does not like nor want to use the thickened drinks" and he further states "I've had pneumonia anyway" indicating this to be true despite following dietary restrictions.   Aspiration mitigation strategies in place but given pt's inconsisent swallow performance, barium retention and comorbidities, he will be chronic aspiration/aspiration pna risk.  Given pt did not aspirate with thin via tsp, provided him with a Provale cup to use for thins.  Advise addressing pt's wishes for dietary advancement with  MDs, NPs, family and pt especially given his advanced age, comorbidities and wishes.  Recommend continue postures of chin tuck and effortful swallows.  Strengthening tongue base retraction and laryngeal elevation advised.  Pt informed to maintain strength of "hock" to clear pharynx/valleculae and cough for airway clearance. He reported understanding to test results/compensation strategies and use of Provale cup.  Pt also would benefit from [employer] Protocol to help him maximize hydration and give him QOL.  Thanks for this consult.   SLP Visit Diagnosis Dysphagia, oropharyngeal phase (R13.12) Attention and concentration deficit following -- Frontal lobe and executive function deficit following -- Impact on safety and function Mild aspiration risk;Moderate aspiration risk;Risk for inadequate nutrition/hydration   CHL IP TREATMENT RECOMMENDATION 09/10/2019 Treatment Recommendations Defer until completion of intrumental exam   Prognosis 09/12/2019 Prognosis for Safe Diet Advancement Fair Barriers to Reach Goals Severity of deficits Barriers/Prognosis Comment -- CHL IP DIET RECOMMENDATION 12/14/2019 SLP Diet Recommendations Dysphagia 3 (Mech soft) solids;Nectar thick liquid;Thin liquid Liquid Administration via Cup;Straw;Other (Comment) Medication Administration Other (Comment) crushed if large  Compensations Slow rate;Small sips/bites;Follow solids with liquid;Effortful swallow;Chin tuck;Use straw to facilitate chin tuck, intermittent dry swallow and intermittent "hock" and swallow or expectorate Postural Changes Remain semi-upright after after feeds/meals (Comment);Seated upright at 90 degrees   CHL IP OTHER RECOMMENDATIONS 12/14/2019 Recommended Consults (No Data) Oral Care Recommendations -- Other Recommendations --   CHL IP FOLLOW UP RECOMMENDATIONS 09/13/2019 Follow up Recommendations Skilled Nursing facility   [facility] IP FREQUENCY AND DURATION 09/12/2019 Speech Therapy Frequency (ACUTE ONLY) min 2x/week Treatment Duration 1 week      CHL IP ORAL PHASE 12/14/2019 Oral Phase Impaired Oral - Pudding Teaspoon -- Oral - Pudding Cup -- Oral - Honey Teaspoon -- Oral - Honey Cup -- Oral - Nectar Teaspoon Weak lingual manipulation;Reduced posterior propulsion;Decreased bolus cohesion;Premature spillage Oral - Nectar Cup Weak lingual manipulation;Reduced posterior propulsion;Piecemeal swallowing;Decreased bolus cohesion;Premature spillage Oral - Nectar Straw Weak lingual manipulation;Reduced posterior propulsion;Piecemeal swallowing;Decreased bolus cohesion;Premature spillage Oral - Thin Teaspoon Weak lingual manipulation;Reduced posterior propulsion;Premature spillage Oral - Thin Cup Weak lingual manipulation;Reduced posterior propulsion;Piecemeal swallowing;Decreased bolus cohesion;Premature spillage Oral - Thin Straw Weak lingual manipulation;Reduced posterior propulsion;Piecemeal swallowing;Decreased bolus cohesion;Premature spillage Oral - Puree Weak lingual manipulation;Reduced posterior propulsion Oral - Mech Soft Impaired mastication;Weak lingual manipulation;Reduced posterior propulsion Oral - Regular -- Oral - Multi-Consistency -- Oral - Pill -- Oral Phase - Comment --  CHL IP PHARYNGEAL PHASE 12/14/2019 Pharyngeal Phase Impaired Pharyngeal- Pudding Teaspoon -- Pharyngeal -- Pharyngeal- Pudding Cup --  Pharyngeal -- Pharyngeal- Honey Teaspoon -- Pharyngeal -- Pharyngeal- Honey Cup -- Pharyngeal -- Pharyngeal- Nectar Teaspoon Delayed swallow initiation-pyriform sinuses Pharyngeal Material does not enter airway Pharyngeal- Nectar Cup Reduced epiglottic inversion;Reduced tongue base retraction;Pharyngeal residue - valleculae Pharyngeal Material does not enter airway Pharyngeal- Nectar Straw -- Pharyngeal -- Pharyngeal- Thin Teaspoon Delayed swallow initiation-vallecula;Reduced epiglottic inversion;Reduced laryngeal elevation;Reduced airway/laryngeal closure;Reduced tongue base retraction;Pharyngeal residue - valleculae Pharyngeal -- Pharyngeal- Thin Cup Reduced epiglottic inversion;Reduced laryngeal elevation;Reduced airway/laryngeal closure;Reduced tongue base retraction;Penetration/Aspiration during swallow;Pharyngeal residue - valleculae Pharyngeal Material enters airway, remains ABOVE vocal cords and not ejected out;Material enters airway, passes BELOW cords and not ejected out despite cough attempt by patient Pharyngeal- Thin Straw Delayed swallow initiation-vallecula;Reduced epiglottic inversion;Reduced laryngeal elevation;Pharyngeal residue - valleculae;Compensatory strategies attempted (with notebox) Pharyngeal -- Pharyngeal- Puree Delayed swallow initiation-vallecula;Reduced pharyngeal peristalsis;Reduced tongue base retraction;Pharyngeal residue - valleculae;Lateral channel residue Pharyngeal -- Pharyngeal- Mechanical Soft Delayed swallow initiation-vallecula;Reduced tongue base retraction;Pharyngeal residue - valleculae Pharyngeal -- Pharyngeal- Regular --  Pharyngeal -- Pharyngeal- Multi-consistency -- Pharyngeal -- Pharyngeal- Pill -- Pharyngeal -- Pharyngeal Comment --  CHL IP CERVICAL ESOPHAGEAL PHASE 12/14/2019 Cervical Esophageal Phase Impaired Pudding Teaspoon -- Pudding Cup -- Honey Teaspoon -- Honey Cup -- Nectar Teaspoon -- Nectar Cup -- Nectar Straw -- Thin Teaspoon -- Thin Cup -- Thin Straw --  Puree -- Mechanical Soft -- Regular -- Multi-consistency -- Pill -- Cervical Esophageal Comment -- Kathleen Lime, MS [facility] SLP Acute Rehab Services Office [phone] [name] 12/14/2019, 3:17 PM  CLINICAL DATA:  Dysphagia. Recent pneumonia despite intake restrictions. EXAM: MODIFIED BARIUM SWALLOW TECHNIQUE: Different consistencies of barium were administered orally to the patient by the Speech Pathologist. Imaging of the pharynx was performed in the lateral projection. The radiologist was present in the fluoroscopy room for this study, providing personal supervision. FLUOROSCOPY TIME:  Fluoroscopy Time:  1 minutes 48 seconds Radiation Exposure Index (if provided by the fluoroscopic device): 3.1 mGy Number of Acquired Spot Images: 0 COMPARISON:  None. FINDINGS: With thin barium, there was aspiration into the cervical trachea with a cough reflex. This improved with the chin tucked and did not occur when only a teaspoon of thin liquid was swallowed at a time. There was also mild vallecular pooling. There was flash laryngeal penetration and mild vallecular pooling with nectar. Vallecular pooling was demonstrated with puree. This did not clear with subsequent swallows of nectar. Vallecular pooling was demonstrated with cracker. IMPRESSION: 1. Aspiration with thin liquid with a cough reflex. This improved with the chin tucked and did not occur when only a teaspoon of thin liquid was swallowed at a time. 2. Flash laryngeal penetration and mild vallecular pooling with nectar. 3. Vallecular pulling with puree and cracker. Please refer to the Speech Pathologists report for complete details and recommendations. Electronically Signed   By: Claudie Revering M.D.   On: 12/14/2019 14:11   DG Chest Port 1 View  Result Date: 12/21/2019 CLINICAL DATA:  Lethargy, pneumonia diagnosis 1 week ago, productive cough EXAM: PORTABLE CHEST 1 VIEW COMPARISON:  09/10/2019 FINDINGS: Single frontal view of the chest demonstrates right lower  lobe consolidation and trace right effusion compatible with known history of pneumonia. No pneumothorax. Left chest is clear. Cardiac silhouette is unremarkable. Dual lead pacemaker is stable. IMPRESSION: 1. Right lower lobe pneumonia with trace right parapneumonic effusion. Electronically Signed   By: Randa Ngo M.D.   On: 12/21/2019 18:00    Assessment/Plan Symptomatic anemia HGB 7.4 today Occult Negative Eliquis on Hold Feeling Weak and Dizzy Will send to ED for Further EVal  Syncope, unspecified syncope type ? Etiology but with Anemia Needs Further Work up   Oropharyngeal dysphagia Has lost 20 lbs Not able to tolerate PO meds  Pneumonia of right middle lobe due to infectious organism On 2 antibiotics since last 5 days Cerebrovascular accident (CVA) due to embolism of left vertebral artery (HCC) On Statin Eliquis on hold   Family/ staff Communication: D/W Son   Labs/tests ordered:   Total time spent in this patient care encounter was  45_  minutes; greater than 50% of the visit spent counseling patient and staff, reviewing records , Labs and coordinating care for problems addressed at this encounter.

## 2019-12-26 NOTE — Discharge Instructions (Signed)
Your work-up today was overall reassuring.  Please make sure to follow-up with the [facility] gastroenterology for further evaluation.  Return to the ER if your symptoms worsen.

## 2019-12-26 NOTE — ED Notes (Signed)
Pt is unable to ambulate he could not even stand without putting his weight on me.

## 2019-12-26 NOTE — ED Notes (Signed)
[facility] called for transport.  

## 2019-12-26 NOTE — Progress Notes (Deleted)
Location: [facility] Room Number: 3 Place of Service:  SNF (31)  Provider:   Code Status:  Goals of Care:  Advanced Directives 12/24/2019  Does Patient Have a Medical Advance Directive? Yes  Type of Paramedic of [place];Living will  Does patient want to make changes to medical advance directive? No - Patient declined  Copy of [place] in Chart? Yes - validated most recent copy scanned in chart (See row information)  Would patient like information on creating a medical advance directive? -     Chief Complaint  Patient presents with  . Acute Visit    Anemia    HPI: Patient is a 86 y.o. male seen today for an acute visit for Acute Drop in Hgb  Patient has h/o Acute Right MCA infarct with left Sided weakness from 5/9-5/14.MRI scan showed right subcortical infarct most likely embolic etiology Patient has past medical history of hypertension, hypothyroidism, chronic diastolic CHF, permanent A. fib , traumatic ICH in January 2021 and Collesfracture of right radius, PPM implant.,History of PMR, hyperlipidemia  Since then Patient has had recurent Pneumonia. Has lost weight. Not tolerating Modified Diet And now he has dropped his Hgb from 13.8 on 5/27 to 7.4 today. Stays Occult Negative But He is feeling weak. Dizzy, Coughing and SOB. He has been on 2 Antibiotics since his last visit to ED. I seating just 10 % of his Food Also had episode of Syncope yesterday. Cannot start his Eliquis due to Anemia Past Medical History:  Diagnosis Date  . AF (atrial fibrillation) [facility] 2)    a. s/p RFCA/PVI @ [facility] clinic 2007.  . Arthritis    "joints" (06/21/2013)  . Atrial flutter ([place])    a. 06/2013 s/p TEE/DCCV  . Chronic systolic CHF (congestive heart failure) ([place])    a. 06/2013 Echo: EF 20%, diff HK, mild LVH.  Marland Kitchen History of cardiovascular stress test    Lexiscan Myoview 8/16:  EF 59%, attenuation artifact, no ischemia;  Low Risk  . Hyperlipidemia   . Hypertension   . HYPERTENSION, BENIGN 08/21/2009   Qualifier: Diagnosis of  By: Caryl Comes, MD, Remus Blake  Medications(s)  enalapril   . Hypothyroidism   . Neoplasm of uncertain behavior    SKIN  . Polymyalgia rheumatica ([facility]) 01/13/2007   Qualifier: History of  By: Danny Lawless CMA, [place]     . Presence of permanent cardiac pacemaker     Past Surgical History:  Procedure Laterality Date  . ATRIAL FIBRILLATION ABLATION  2007  . CARDIOVERSION N/A 06/22/2013   Procedure: CARDIOVERSION;  Surgeon: Sueanne Margarita, MD;  Location: [facility] ENDOSCOPY;  Service: Cardiovascular;  Laterality: N/A;  15:17 Lido 20mg ,IV , Propofol 30 mg, IV synched cardioversion @ 150 joules by Dr. Lajoyce Lauber from a flutter to sinis brady which is baseline for this pt, reports 45-50 reg HR verified by Dr. Ashok Norris  . CARDIOVERSION N/A 11/21/2014   Procedure: CARDIOVERSION;  Surgeon: Fay Records, MD;  Location: [facility] ENDOSCOPY;  Service: Cardiovascular;  Laterality: N/A;  . CARDIOVERSION N/A 07/14/2015   Procedure: CARDIOVERSION;  Surgeon: Sueanne Margarita, MD;  Location: [facility];  Service: Cardiovascular;  Laterality: N/A;  . CATARACT EXTRACTION, BILATERAL Bilateral Summer 2009  . CHOLECYSTECTOMY    . EP IMPLANTABLE DEVICE N/A 11/10/2015   Procedure: Pacemaker Implant;  Surgeon: [name], MD;  Location: [facility] CV LAB;  Service: Cardiovascular;  Laterality: N/A;  . HAND SURGERY Right    "  broke bone; grafted area w/bone from somewhere else"  . HEMORRHOID SURGERY    . INGUINAL HERNIA REPAIR Bilateral   . TEE WITHOUT CARDIOVERSION N/A 06/22/2013   Procedure: TRANSESOPHAGEAL ECHOCARDIOGRAM (TEE);  Surgeon: Sueanne Margarita, MD;  Location: [facility] ENDOSCOPY;  Service: Cardiovascular;  Laterality: N/A;  . TEE WITHOUT CARDIOVERSION N/A 07/14/2015   Procedure: TRANSESOPHAGEAL ECHOCARDIOGRAM (TEE);  Surgeon: Sueanne Margarita, MD;  Location: [facility] ENDOSCOPY;  Service: Cardiovascular;  Laterality: N/A;  .  TONSILLECTOMY      No Known Allergies  Outpatient Encounter Medications as of 12/26/2019  Medication Sig  . amoxicillin-clavulanate (AUGMENTIN) 875-125 MG tablet Take 1 tablet by mouth 2 (two) times daily.  Marland Kitchen antiseptic oral rinse (BIOTENE) LIQD 20 mLs by Mouth Rinse route as needed for dry mouth.   Marland Kitchen apixaban (ELIQUIS) 5 MG TABS tablet Take 5 mg by mouth 2 (two) times daily.  Marland Kitchen atorvastatin (LIPITOR) 40 MG tablet Take 40 mg by mouth daily.  . carboxymethylcellulose (REFRESH PLUS) 0.5 % SOLN 1 drop every 4 (four) hours as needed. Each eye  . docusate sodium (COLACE) 100 MG capsule Take 200 mg by mouth daily.  Marland Kitchen doxycycline (VIBRAMYCIN) 100 MG capsule Take 1 capsule (100 mg total) by mouth 2 (two) times daily.  . ferrous sulfate 325 (65 FE) MG EC tablet Take 325 mg by mouth daily.  Marland Kitchen glucosamine-chondroitin 500-400 MG tablet Take 1 tablet by mouth 3 (three) times daily.  Marland Kitchen levothyroxine (SYNTHROID) 50 MCG tablet Take 1 tablet (50 mcg total) by mouth daily before breakfast.  . mirtazapine (REMERON) 15 MG tablet Take 15 mg by mouth at bedtime.   . Multiple Vitamin (MULTIVITAMIN WITH MINERALS) TABS tablet Take 1 tablet by mouth daily. Centrum  . Nutritional Supplements (RESOURCE 2.0 PO) Take by mouth in the morning, at noon, and at bedtime.  . polyethylene glycol (MIRALAX / GLYCOLAX) 17 g packet Take 17 g by mouth daily. 17 gram/dose, oral, Once A Morning, Miralax 17 gm PO QD. Hold for diarrhea.  . sennosides-docusate sodium (SENOKOT-S) 8.6-50 MG tablet Take 2 tablets by mouth daily.   No facility-administered encounter medications on file as of 12/26/2019.    Review of Systems:  Review of Systems  Constitutional: Positive for activity change, appetite change and unexpected weight change.  HENT: Positive for congestion.   Respiratory: Positive for cough and shortness of breath.   Cardiovascular: Negative for leg swelling.  Gastrointestinal: Negative.   Genitourinary: Negative.     Musculoskeletal: Positive for gait problem.  Skin: Negative.   Neurological: Positive for weakness and light-headedness.  Psychiatric/Behavioral: Negative.     Health Maintenance  Topic Date Due  . INFLUENZA VACCINE  12/02/2019  . TETANUS/TDAP  05/05/2029  . COVID-19 Vaccine  Completed  . PNA vac Low Risk Adult  Completed    Physical Exam: Vitals:   12/26/19 1039  BP: 127/84  Pulse: 82  Resp: 19  Temp: (!) 97.4 F (36.3 C)  SpO2: 95%  Weight: 126 lb 9.6 oz (57.4 kg)  Height: 5\' 9"  (1.753 m)   Body mass index is 18.7 kg/m. Physical Exam Vitals reviewed.  Constitutional:      Comments: Very Frail  HENT:     Head: Normocephalic.     Nose: Nose normal.     Mouth/Throat:     Mouth: Mucous membranes are moist.     Pharynx: Oropharynx is clear.  Eyes:     Pupils: Pupils are equal, round, and reactive to light.  Cardiovascular:  Rate and Rhythm: Normal rate and regular rhythm.     Pulses: Normal pulses.  Pulmonary:     Effort: Pulmonary effort is normal. No respiratory distress.     Breath sounds: Normal breath sounds. No wheezing or rales.  Abdominal:     General: Abdomen is flat. Bowel sounds are normal.     Palpations: Abdomen is soft.     Comments: Rectal Exam Occult Negative  Musculoskeletal:        General: No swelling.     Cervical back: Neck supple.     Comments: Left Arm is swollen  Skin:    General: Skin is warm.  Neurological:     Mental Status: He is alert and oriented to person, place, and time.     Comments: Has Left UE Hemiparesis  Psychiatric:        Mood and Affect: Mood normal.     Labs reviewed: Basic Metabolic Panel: Recent Labs    09/09/19 1138 09/10/19 0621 09/12/19 0237 09/12/19 1326 09/13/19 0454 09/13/19 0454 09/14/19 0301 09/27/19 0000 12/21/19 1741  NA 141   < >   < >  --  140   < > 141 142 138  K 4.5   < >   < >  --  3.9   < > 4.2 4.1 4.0  CL 104   < >   < >  --  106   < > 107 108 102  CO2 28   < >   < >  --  26    < > 25 26* 27  GLUCOSE 104*   < >   < >  --  109*  --  118*  --  104*  BUN 16   < >   < >  --  33*   < > 33* 33* 17  CREATININE 0.98   < >   < >  --  1.26*   < > 1.14 1.0 0.68  CALCIUM 9.3   < >   < >  --  8.8*   < > 8.9 9.2 8.5*  MG 2.0  --   --   --   --   --   --   --   --   TSH 3.235  --   --  3.939  --   --   --   --   --    < > = values in this interval not displayed.   Liver Function Tests: Recent Labs    05/07/19 0512 05/07/19 0512 09/09/19 1138 09/27/19 0000 12/21/19 1741  AST 31   < > 21 17 31   ALT 8   < > 10 6* 13  ALKPHOS 59   < > 72 84 75  BILITOT 1.3*  --  1.0  --  0.7  PROT 6.0*  --  6.6  --  7.3  ALBUMIN 3.4*   < > 3.8 3.9 2.5*   < > = values in this interval not displayed.   No results for input(s): LIPASE, AMYLASE in the last 8760 hours. No results for input(s): AMMONIA in the last 8760 hours. CBC: Recent Labs    09/09/19 1138 09/10/19 0621 09/13/19 0454 09/13/19 0454 09/14/19 0301 09/27/19 0000 12/21/19 1741  WBC 4.2   < > 5.6   < > 5.4 6.2 7.3  NEUTROABS 3.0  --   --   --   --  4,700 6.2  HGB 13.4   < >  12.6*   < > 12.9* 13.8 8.4*  HCT 42.2   < > 38.4*   < > 39.3 41 27.5*  MCV 95.3   < > 93.9  --  94.0  --  93.2  PLT 134*   < > 128*   < > 133* 186 218   < > = values in this interval not displayed.   Lipid Panel: Recent Labs    09/10/19 0621  CHOL 133  HDL 41  LDLCALC 77  TRIG 73  CHOLHDL 3.2   Lab Results  Component Value Date   HGBA1C 5.5 09/10/2019    Procedures since last visit: Leanne Chang OP MEDICARE SPEECH PATH  Result Date: 12/14/2019 Objective Swallowing Evaluation: Type of Study: MBS-Modified Barium Swallow Study  Patient Details Name: BLAYN WHETSELL MRN: [id number] Date of Birth: 1925/12/06 Today's Date: 12/14/2019 Time: SLP Start Time (ACUTE ONLY): 0355 -SLP Stop Time (ACUTE ONLY): 1401 SLP Time Calculation (min) (ACUTE ONLY): 46 min Past Medical History: Past Medical History: Diagnosis Date . AF (atrial fibrillation)  [facility])   a. s/p RFCA/PVI @ [facility] clinic 2007. . Arthritis   "joints" (06/21/2013) . Atrial flutter ([place])   a. 06/2013 s/p TEE/DCCV . Chronic systolic CHF (congestive heart failure) ([place])   a. 06/2013 Echo: EF 20%, diff HK, mild LVH. Marland Kitchen History of cardiovascular stress test   Lexiscan Myoview 8/16:  EF 59%, attenuation artifact, no ischemia; Low Risk . Hyperlipidemia  . Hypertension  . HYPERTENSION, BENIGN 08/21/2009  Qualifier: Diagnosis of  By: Caryl Comes, MD, Remus Blake  Medications(s)  enalapril  . Hypothyroidism  . Neoplasm of uncertain behavior   SKIN . Polymyalgia rheumatica ([place]) 01/13/2007  Qualifier: History of  By: Danny Lawless CMA, [place]    . Presence of permanent cardiac pacemaker  Past Surgical History: Past Surgical History: Procedure Laterality Date . ATRIAL FIBRILLATION ABLATION  2007 . CARDIOVERSION N/A 06/22/2013  Procedure: CARDIOVERSION;  Surgeon: Sueanne Margarita, MD;  Location: [facility] ENDOSCOPY;  Service: Cardiovascular;  Laterality: N/A;  15:17 Lido 20mg ,IV , Propofol 30 mg, IV synched cardioversion @ 150 joules by Dr. Lajoyce Lauber from a flutter to sinis brady which is baseline for this pt, reports 45-50 reg HR verified by Dr. Ashok Norris . CARDIOVERSION N/A 11/21/2014  Procedure: CARDIOVERSION;  Surgeon: Fay Records, MD;  Location[facility] ENDOSCOPY;  Service: Cardiovascular;  Laterality: N/A; . CARDIOVERSION N/A 07/14/2015  Procedure: CARDIOVERSION;  Surgeon: Sueanne Margarita, MD;  Location: [facility];  Service: Cardiovascular;  Laterality: N/A; . CATARACT EXTRACTION, BILATERAL Bilateral Summer 2009 . CHOLECYSTECTOMY   . EP IMPLANTABLE DEVICE N/A 11/10/2015  Procedure: Pacemaker Implant;  Surgeon: [name], MD;  Location: [facility] CV LAB;  Service: Cardiovascular;  Laterality: N/A; . HAND SURGERY Right   "broke bone; grafted area w/bone from somewhere else" . HEMORRHOID SURGERY   . INGUINAL HERNIA REPAIR Bilateral  . TEE WITHOUT CARDIOVERSION N/A 06/22/2013  Procedure: TRANSESOPHAGEAL ECHOCARDIOGRAM  (TEE);  Surgeon: Sueanne Margarita, MD;  Location: [facility] ENDOSCOPY;  Service: Cardiovascular;  Laterality: N/A; . TEE WITHOUT CARDIOVERSION N/A 07/14/2015  Procedure: TRANSESOPHAGEAL ECHOCARDIOGRAM (TEE);  Surgeon: Sueanne Margarita, MD;  Location: [facility] ENDOSCOPY;  Service: Cardiovascular;  Laterality: N/A; . TONSILLECTOMY   HPI: HPI: 86 yo male admitted to [facility] with weakness, dysarthria and fall with concern for acute CVA.  Pt CT scan head showed chronic lacunar CVAs x2 basal ganglia, small right cerebellar CVA.  MRI showed insular CVA. Concern for worsening symptoms, so new CT has  been ordered to r/o extension.  Pt PMH + for HTN, hypothyroidism.    Pt with recent pna in July 2021.  He is being referred for repeat MBS to determine Least restrictive diet and to objectively evaluate swallowing.   Subjective: pt awake in chair Assessment / Plan / Recommendation CHL IP CLINICAL IMPRESSIONS 12/14/2019 Clinical Impression Patient presents with ongoing moderate oropharyngeal dysphagia with aspiration of thin liquids due to decreased timing of and adequacy of laryngeal elevation/closure.  Aspiration was sensed with reflexive cough noted but cough did not clear aspirates.  Oral control of barium was decreased due to pt's lingual weakness which results in premature spillage and decreased propulsion.  Piecemealing noted intermittently which was marginally delayed with pudding/nectar.   Pharyngeal swallow was inconsistent in performance, at times stronger and more efficient than others.  With cues for "fast and hard swallow" and chin tuck posture, pt able to protect his airway with nectar, pudding, cracker.   However despite use of chin tuck, pt still aspirated with thin when retained pudding barium noted in pharynx.  Single tsps thin with head neutral were not aspirated.  Of note, if pt could elicit swallow timing before head of bolus reached over epiglottis or pyriform sinus, he did not aspirate with thin. As, penetration and aspiration  of thin occur due to decreased timing and adequacy of laryngeal closure.   Thin barium pooling at pyriform sinus before swallow triggered spilled into larynx/trachea with swallow initiation.  On the 15th and final bolus of the test *nectar*, pt noted to have swallow trigger at pyriform sinus at 2 seconds- oral residuals/piecemealed nectar bolus triggered at SEVEN secondary at pyriform sinus with pt needing verbal cues to swallow.  Suspect initiation difficulties are likely from his right frontal lobe cva impacting initiation and cerebellar/basal ganglia CVA contribute to dysphagia.  Pt does present with cough with all aspiration episodes, thus advancing diet clinically may be feasible.  Pt verbalized to this SLP that he "does not like nor want to use the thickened drinks" and he further states "I've had pneumonia anyway" indicating this to be true despite following dietary restrictions.   Aspiration mitigation strategies in place but given pt's inconsisent swallow performance, barium retention and comorbidities, he will be chronic aspiration/aspiration pna risk.  Given pt did not aspirate with thin via tsp, provided him with a Provale cup to use for thins.  Advise addressing pt's wishes for dietary advancement with MDs, NPs, family and pt especially given his advanced age, comorbidities and wishes.  Recommend continue postures of chin tuck and effortful swallows.  Strengthening tongue base retraction and laryngeal elevation advised.  Pt informed to maintain strength of "hock" to clear pharynx/valleculae and cough for airway clearance. He reported understanding to test results/compensation strategies and use of Provale cup.  Pt also would benefit from [employer] Protocol to help him maximize hydration and give him QOL.  Thanks for this consult.   SLP Visit Diagnosis Dysphagia, oropharyngeal phase (R13.12) Attention and concentration deficit following -- Frontal lobe and executive function deficit following --  Impact on safety and function Mild aspiration risk;Moderate aspiration risk;Risk for inadequate nutrition/hydration   CHL IP TREATMENT RECOMMENDATION 09/10/2019 Treatment Recommendations Defer until completion of intrumental exam   Prognosis 09/12/2019 Prognosis for Safe Diet Advancement Fair Barriers to Reach Goals Severity of deficits Barriers/Prognosis Comment -- CHL IP DIET RECOMMENDATION 12/14/2019 SLP Diet Recommendations Dysphagia 3 (Mech soft) solids;Nectar thick liquid;Thin liquid Liquid Administration via Cup;Straw;Other (Comment) Medication Administration Other (Comment) crushed  if large Compensations Slow rate;Small sips/bites;Follow solids with liquid;Effortful swallow;Chin tuck;Use straw to facilitate chin tuck, intermittent dry swallow and intermittent "hock" and swallow or expectorate Postural Changes Remain semi-upright after after feeds/meals (Comment);Seated upright at 90 degrees   CHL IP OTHER RECOMMENDATIONS 12/14/2019 Recommended Consults (No Data) Oral Care Recommendations -- Other Recommendations --   CHL IP FOLLOW UP RECOMMENDATIONS 09/13/2019 Follow up Recommendations Skilled Nursing facility   [facility] IP FREQUENCY AND DURATION 09/12/2019 Speech Therapy Frequency (ACUTE ONLY) min 2x/week Treatment Duration 1 week      CHL IP ORAL PHASE 12/14/2019 Oral Phase Impaired Oral - Pudding Teaspoon -- Oral - Pudding Cup -- Oral - Honey Teaspoon -- Oral - Honey Cup -- Oral - Nectar Teaspoon Weak lingual manipulation;Reduced posterior propulsion;Decreased bolus cohesion;Premature spillage Oral - Nectar Cup Weak lingual manipulation;Reduced posterior propulsion;Piecemeal swallowing;Decreased bolus cohesion;Premature spillage Oral - Nectar Straw Weak lingual manipulation;Reduced posterior propulsion;Piecemeal swallowing;Decreased bolus cohesion;Premature spillage Oral - Thin Teaspoon Weak lingual manipulation;Reduced posterior propulsion;Premature spillage Oral - Thin Cup Weak lingual manipulation;Reduced  posterior propulsion;Piecemeal swallowing;Decreased bolus cohesion;Premature spillage Oral - Thin Straw Weak lingual manipulation;Reduced posterior propulsion;Piecemeal swallowing;Decreased bolus cohesion;Premature spillage Oral - Puree Weak lingual manipulation;Reduced posterior propulsion Oral - Mech Soft Impaired mastication;Weak lingual manipulation;Reduced posterior propulsion Oral - Regular -- Oral - Multi-Consistency -- Oral - Pill -- Oral Phase - Comment --  CHL IP PHARYNGEAL PHASE 12/14/2019 Pharyngeal Phase Impaired Pharyngeal- Pudding Teaspoon -- Pharyngeal -- Pharyngeal- Pudding Cup -- Pharyngeal -- Pharyngeal- Honey Teaspoon -- Pharyngeal -- Pharyngeal- Honey Cup -- Pharyngeal -- Pharyngeal- Nectar Teaspoon Delayed swallow initiation-pyriform sinuses Pharyngeal Material does not enter airway Pharyngeal- Nectar Cup Reduced epiglottic inversion;Reduced tongue base retraction;Pharyngeal residue - valleculae Pharyngeal Material does not enter airway Pharyngeal- Nectar Straw -- Pharyngeal -- Pharyngeal- Thin Teaspoon Delayed swallow initiation-vallecula;Reduced epiglottic inversion;Reduced laryngeal elevation;Reduced airway/laryngeal closure;Reduced tongue base retraction;Pharyngeal residue - valleculae Pharyngeal -- Pharyngeal- Thin Cup Reduced epiglottic inversion;Reduced laryngeal elevation;Reduced airway/laryngeal closure;Reduced tongue base retraction;Penetration/Aspiration during swallow;Pharyngeal residue - valleculae Pharyngeal Material enters airway, remains ABOVE vocal cords and not ejected out;Material enters airway, passes BELOW cords and not ejected out despite cough attempt by patient Pharyngeal- Thin Straw Delayed swallow initiation-vallecula;Reduced epiglottic inversion;Reduced laryngeal elevation;Pharyngeal residue - valleculae;Compensatory strategies attempted (with notebox) Pharyngeal -- Pharyngeal- Puree Delayed swallow initiation-vallecula;Reduced pharyngeal peristalsis;Reduced tongue  base retraction;Pharyngeal residue - valleculae;Lateral channel residue Pharyngeal -- Pharyngeal- Mechanical Soft Delayed swallow initiation-vallecula;Reduced tongue base retraction;Pharyngeal residue - valleculae Pharyngeal -- Pharyngeal- Regular -- Pharyngeal -- Pharyngeal- Multi-consistency -- Pharyngeal -- Pharyngeal- Pill -- Pharyngeal -- Pharyngeal Comment --  CHL IP CERVICAL ESOPHAGEAL PHASE 12/14/2019 Cervical Esophageal Phase Impaired Pudding Teaspoon -- Pudding Cup -- Honey Teaspoon -- Honey Cup -- Nectar Teaspoon -- Nectar Cup -- Nectar Straw -- Thin Teaspoon -- Thin Cup -- Thin Straw -- Puree -- Mechanical Soft -- Regular -- Multi-consistency -- Pill -- Cervical Esophageal Comment -- Kathleen Lime, MS [facility] SLP Acute Rehab Services Office [phone] [name] 12/14/2019, 3:17 PM  CLINICAL DATA:  Dysphagia. Recent pneumonia despite intake restrictions. EXAM: MODIFIED BARIUM SWALLOW TECHNIQUE: Different consistencies of barium were administered orally to the patient by the Speech Pathologist. Imaging of the pharynx was performed in the lateral projection. The radiologist was present in the fluoroscopy room for this study, providing personal supervision. FLUOROSCOPY TIME:  Fluoroscopy Time:  1 minutes 48 seconds Radiation Exposure Index (if provided by the fluoroscopic device): 3.1 mGy Number of Acquired Spot Images: 0 COMPARISON:  None. FINDINGS: With thin barium, there was aspiration into the cervical  trachea with a cough reflex. This improved with the chin tucked and did not occur when only a teaspoon of thin liquid was swallowed at a time. There was also mild vallecular pooling. There was flash laryngeal penetration and mild vallecular pooling with nectar. Vallecular pooling was demonstrated with puree. This did not clear with subsequent swallows of nectar. Vallecular pooling was demonstrated with cracker. IMPRESSION: 1. Aspiration with thin liquid with a cough reflex. This improved with the chin  tucked and did not occur when only a teaspoon of thin liquid was swallowed at a time. 2. Flash laryngeal penetration and mild vallecular pooling with nectar. 3. Vallecular pulling with puree and cracker. Please refer to the Speech Pathologists report for complete details and recommendations. Electronically Signed   By: Claudie Revering M.D.   On: 12/14/2019 14:11   DG Chest Port 1 View  Result Date: 12/21/2019 CLINICAL DATA:  Lethargy, pneumonia diagnosis 1 week ago, productive cough EXAM: PORTABLE CHEST 1 VIEW COMPARISON:  09/10/2019 FINDINGS: Single frontal view of the chest demonstrates right lower lobe consolidation and trace right effusion compatible with known history of pneumonia. No pneumothorax. Left chest is clear. Cardiac silhouette is unremarkable. Dual lead pacemaker is stable. IMPRESSION: 1. Right lower lobe pneumonia with trace right parapneumonic effusion. Electronically Signed   By: Randa Ngo M.D.   On: 12/21/2019 18:00    Assessment/Plan Symptomatic Anemia ? Etiology Patient has dropped his Hgb to 7.4 today Stays Occult Negative. On Iron Supplement Already has had 1 episode of Syncope. His Eliquis is also on hold. Feeling weak and Dizzy. Will send to ED for Possible work up for his Anemia D/W the Son Recurent Pneumonia  I son 2 Antibiotics since last visit to ED Still coughing and SOB  Labs/tests ordered:  * No order type specified * Next appt:  Visit date not found  Total time spent in this patient care encounter was 45 _  minutes; greater than 50% of the visit spent counseling patient and staff, reviewing records , Labs and coordinating care for problems addressed at this encounter.

## 2019-12-26 NOTE — ED Triage Notes (Signed)
Patient arrives via [facility].  Was here 5 days ago for PNA.  Patient's hemoglobin 7.4 12/25/2019.  Vitals with EMS was 140/80, 70-HR, 28-RR, 98% RA and 98.0 temp.

## 2019-12-26 NOTE — ED Provider Notes (Signed)
[facility] DEPT Provider Note   CSN: [id number] Arrival date & time: 12/26/19  1229     History Chief Complaint  Patient presents with  . Abnormal Lab  . Pneumonia    [name] is a 86 y.o. male.  HPI 86 year old male with history of paroxysmal atrial fibrillation holding Eliquis 2/2 anemia, arthritis, A. fib, CKD stage III, iron deficiency anemia, dysphagia [facility] with complaints of weakness, dizziness and shortness of breath. History provided via patient, facility and chart review. Patient was seen here in the ED approximately 5 days ago with similar complaints. Found to have a hemoglobin of 8.4, and a right lower lobe pneumonia. There was no indication for transfusion, was instructed to take oral iron supplements and started on Augmentin and doxycycline. Per chart review, patient had 2 syncopal episodes yesterday and was noted to have a hemoglobin of 7.4 further work-up. Patient had remained Hemoccult negative here in the ED 5 days ago and Hemoccult study yesterday. Not seen GI. [facility], they report that the patient's PCP sent the patient here to be admitted for further work-up. Pt states his symptoms have not changed since his last visit to the ED. He has had both Covid vaccines    Past Medical History:  Diagnosis Date  . AF (atrial fibrillation) [facility])    a. s/p RFCA/PVI @ [facility] clinic 2007.  . Arthritis    "joints" (06/21/2013)  . Atrial flutter ([place])    a. 06/2013 s/p TEE/DCCV  . Chronic systolic CHF (congestive heart failure) ([place])    a. 06/2013 Echo: EF 20%, diff HK, mild LVH.  Marland Kitchen History of cardiovascular stress test    Lexiscan Myoview 8/16:  EF 59%, attenuation artifact, no ischemia; Low Risk  . Hyperlipidemia   . Hypertension   . HYPERTENSION, BENIGN 08/21/2009   Qualifier: Diagnosis of  By: Caryl Comes, MD, Remus Blake  Medications(s)  enalapril   . Hypothyroidism   .  Neoplasm of uncertain behavior    SKIN  . Polymyalgia rheumatica ([place]) 01/13/2007   Qualifier: History of  By: Danny Lawless CMA, [place]     . Presence of permanent cardiac pacemaker     Patient Active Problem List   Diagnosis Date Noted  . Syncope 12/25/2019  . Iron deficiency anemia 12/21/2019  . Weight loss 12/17/2019  . Pneumonia involving right lung 11/28/2019  . Depression, recurrent ([place]) 11/28/2019  . Chest pain 11/06/2019  . Fall 11/01/2019  . Dysphagia 09/17/2019  . Weakness of left side of body 09/14/2019  . Slow transit constipation 09/14/2019  . Cerebral embolism with cerebral infarction 09/10/2019  . CVA (cerebral vascular accident) ([place]) 09/10/2019  . Generalized weakness 09/09/2019  . Essential hypertension 09/09/2019  . Chronic systolic CHF (congestive heart failure) ([place]) 09/09/2019  . SSS (sick sinus syndrome) ([place]) 09/09/2019  . Right radial fracture 05/07/2019  . Intracranial bleed ([place]) 05/06/2019  . BPH (benign prostatic hyperplasia) 10/20/2016  . Polycythemia 07/25/2015  . NICM (nonischemic cardiomyopathy) ([place]) 07/04/2015  . Chronic anticoagulation 07/04/2015  . CKD (chronic kidney disease) stage 3, GFR 30-59 ml/min 01/24/2015  . Encounter for therapeutic drug monitoring 06/26/2013  . Atrial flutter ([place]) 06/21/2013    Class: Acute  . Acute systolic heart failure ([place]) 06/21/2013  . Hearing loss 06/04/2013  . Routine adult health maintenance 07/30/2012  . Palpitation 07/18/2012  . Stress at home 07/18/2012  . Urinary urgency 03/12/2012  . Hypogonadism male 01/16/2010  .  HYPERTENSION, BENIGN 08/21/2009    Class: Chronic  . PAF (paroxysmal atrial fibrillation) ([place]) 01/03/2008  . NEOPLASM OF UNCERTAIN BEHAVIOR OF SKIN 04/05/2007  . Hypothyroidism 01/13/2007  . Hyperlipidemia 01/13/2007  . Polymyalgia rheumatica ([place]) 01/13/2007  . INGUINAL HERNIORRHAPHY, HX OF 01/13/2007    Past Surgical History:  Procedure Laterality Date  . ATRIAL FIBRILLATION  ABLATION  2007  . CARDIOVERSION N/A 06/22/2013   Procedure: CARDIOVERSION;  Surgeon: Sueanne Margarita, MD;  Location: [facility] ENDOSCOPY;  Service: Cardiovascular;  Laterality: N/A;  15:17 Lido 20mg ,IV , Propofol 30 mg, IV synched cardioversion @ 150 joules by Dr. Lajoyce Lauber from a flutter to sinis brady which is baseline for this pt, reports 45-50 reg HR verified by Dr. Ashok Norris  . CARDIOVERSION N/A 11/21/2014   Procedure: CARDIOVERSION;  Surgeon: Fay Records, MD;  Location: [facility] ENDOSCOPY;  Service: Cardiovascular;  Laterality: N/A;  . CARDIOVERSION N/A 07/14/2015   Procedure: CARDIOVERSION;  Surgeon: Sueanne Margarita, MD;  Location: [facility];  Service: Cardiovascular;  Laterality: N/A;  . CATARACT EXTRACTION, BILATERAL Bilateral Summer 2009  . CHOLECYSTECTOMY    . EP IMPLANTABLE DEVICE N/A 11/10/2015   Procedure: Pacemaker Implant;  Surgeon: [name], MD;  Location: [facility] CV LAB;  Service: Cardiovascular;  Laterality: N/A;  . HAND SURGERY Right    "broke bone; grafted area w/bone from somewhere else"  . HEMORRHOID SURGERY    . INGUINAL HERNIA REPAIR Bilateral   . TEE WITHOUT CARDIOVERSION N/A 06/22/2013   Procedure: TRANSESOPHAGEAL ECHOCARDIOGRAM (TEE);  Surgeon: Sueanne Margarita, MD;  Location: [facility] ENDOSCOPY;  Service: Cardiovascular;  Laterality: N/A;  . TEE WITHOUT CARDIOVERSION N/A 07/14/2015   Procedure: TRANSESOPHAGEAL ECHOCARDIOGRAM (TEE);  Surgeon: Sueanne Margarita, MD;  Location: [facility] ENDOSCOPY;  Service: Cardiovascular;  Laterality: N/A;  . TONSILLECTOMY         Family History  Problem Relation Age of Onset  . Emphysema Mother   . Diabetes Mother   . Kidney disease Father     Social History   Tobacco Use  . Smoking status: Former Smoker    Packs/day: 1.00    Years: 12.00    Pack years: 12.00    Types: Cigarettes    Quit date: 05/04/1963    Years since quitting: 56.6  . Smokeless tobacco: Never Used  . Tobacco comment: quit cigars 82'   Substance Use Topics  . Alcohol use:  Yes    Comment: 06/21/2013 "I was a moderate drinker at most; rarely have drink now"  . Drug use: No    Home Medications Prior to Admission medications   Medication Sig Start Date End Date Taking? Authorizing Provider  amoxicillin-clavulanate (AUGMENTIN) 875-125 MG tablet Take 1 tablet by mouth 2 (two) times daily. 12/22/19 12/26/19  [provider]  antiseptic oral rinse (BIOTENE) LIQD 20 mLs by Mouth Rinse route as needed for dry mouth.     [provider]  atorvastatin (LIPITOR) 40 MG tablet Take 40 mg by mouth daily.    [provider]  carboxymethylcellulose (REFRESH PLUS) 0.5 % SOLN 1 drop every 4 (four) hours as needed. Each eye    [provider]  docusate sodium (COLACE) 100 MG capsule Take 200 mg by mouth daily.    [provider]  doxycycline (VIBRAMYCIN) 100 MG capsule Take 1 capsule (100 mg total) by mouth 2 (two) times daily. 12/21/19   Recardo Evangelist, PA-C  ferrous sulfate 325 (65 FE) MG EC tablet Take 325 mg by mouth  daily.    [provider]  glucosamine-chondroitin 500-400 MG tablet Take 1 tablet by mouth 3 (three) times daily.    [provider]  levothyroxine (SYNTHROID) 50 MCG tablet Take 1 tablet (50 mcg total) by mouth daily before breakfast. 08/27/19   Burchette, Alinda Sierras, MD  mirtazapine (REMERON) 15 MG tablet Take 15 mg by mouth at bedtime.     [provider]  Multiple Vitamin (MULTIVITAMIN WITH MINERALS) TABS tablet Take 1 tablet by mouth daily. Centrum    [provider]  Nutritional Supplements (RESOURCE 2.0 PO) Take by mouth in the morning, at noon, and at bedtime.    [provider]  polyethylene glycol (MIRALAX / GLYCOLAX) 17 g packet Take 17 g by mouth daily. 17 gram/dose, oral, Once A Morning, Miralax 17 gm PO QD. Hold for diarrhea.    [provider]  sennosides-docusate sodium (SENOKOT-S) 8.6-50 MG tablet Take 2 tablets by mouth daily.    [provider]      Allergies    Patient has no known allergies.  Review of Systems   Review of Systems  Constitutional: Negative for chills and fever.  HENT: Negative for ear pain and sore throat.   Eyes: Negative for pain and visual disturbance.  Respiratory: Positive for shortness of breath. Negative for cough.   Cardiovascular: Negative for chest pain and palpitations.  Gastrointestinal: Negative for abdominal pain and vomiting.  Genitourinary: Negative for dysuria and hematuria.  Musculoskeletal: Negative for arthralgias and back pain.  Skin: Negative for color change and rash.  Neurological: Positive for dizziness and weakness. Negative for seizures and syncope.  All other systems reviewed and are negative.   Physical Exam Updated Vital Signs BP (!) 156/83   Pulse 81   Temp 98.4 F (36.9 C) (Oral)   Resp 18   SpO2 100%   Physical Exam Vitals and nursing note reviewed.  Constitutional:      General: He is not in acute distress.    Appearance: Normal appearance. He is well-developed. He is not ill-appearing, toxic-appearing or diaphoretic.     Comments: Alert, oriented, nontoxic appearing, following 2 step commands without difficulty   HENT:     Head: Normocephalic and atraumatic.     Mouth/Throat:     Mouth: Mucous membranes are moist.     Pharynx: Oropharynx is clear.  Eyes:     Conjunctiva/sclera: Conjunctivae normal.  Cardiovascular:     Rate and Rhythm: Normal rate and regular rhythm.     Pulses: Normal pulses.     Heart sounds: Normal heart sounds. No murmur heard.   Pulmonary:     Effort: Pulmonary effort is normal. No respiratory distress.     Breath sounds: Rales present.  Abdominal:     General: Abdomen is flat.     Palpations: Abdomen is soft.     Tenderness: There is no abdominal tenderness.  Musculoskeletal:        General: No tenderness. Normal range of motion.     Cervical back: Normal range of motion and neck supple.     Comments: Left sided  UE weakness;  left arm slightly more swollen than right   Skin:    General: Skin is warm and dry.     Findings: No erythema or rash.  Neurological:     General: No focal deficit present.     Mental Status: He is alert and oriented to person, place, and time.     Cranial Nerves: No cranial  nerve deficit.     Sensory: No sensory deficit.     Motor: Weakness present.  Psychiatric:        Mood and Affect: Mood normal.        Behavior: Behavior normal.     ED Results / Procedures / Treatments   Labs (all labs ordered are listed, but only abnormal results are displayed) Labs Reviewed  CBC WITH DIFFERENTIAL/PLATELET - Abnormal; Notable for the following components:      Result Value   RBC 3.04 (*)    Hemoglobin 8.5 (*)    HCT 28.0 (*)    RDW 15.8 (*)    Lymphs Abs 0.5 (*)    All other components within normal limits  COMPREHENSIVE METABOLIC PANEL - Abnormal; Notable for the following components:   Glucose, Bld 165 (*)    Calcium 8.6 (*)    Albumin 2.3 (*)    All other components within normal limits  URINALYSIS, ROUTINE W REFLEX MICROSCOPIC - Abnormal; Notable for the following components:   Color, Urine AMBER (*)    Ketones, ur 5 (*)    Protein, ur 30 (*)    Bacteria, UA RARE (*)    All other components within normal limits  IRON AND TIBC - Abnormal; Notable for the following components:   Iron 17 (*)    TIBC 137 (*)    Saturation Ratios 12 (*)    All other components within normal limits  FERRITIN - Abnormal; Notable for the following components:   Ferritin 902 (*)    All other components within normal limits  RETICULOCYTES - Abnormal; Notable for the following components:   RBC. 3.05 (*)    Immature Retic Fract 19.4 (*)    All other components within normal limits  SARS CORONAVIRUS 2 BY RT PCR (HOSPITAL ORDER, [facility])  URINE CULTURE  VITAMIN B12  FOLATE  POC OCCULT BLOOD, ED  TYPE AND SCREEN    EKG EKG Interpretation  Date/Time:  Wednesday  December 26 2019 13:07:15 EDT Ventricular Rate:  81 PR Interval:    QRS Duration: 91 QT Interval:  378 QTC Calculation: 439 R Axis:   68 Text Interpretation: Sinus rhythm and ATRIAL PACED RHYTHM No significant change since last tracing Abnormal ECG Confirmed by Carmin Muskrat [phone]) on 12/26/2019 1:50:12 PM   Radiology DG Chest Portable 1 View  Result Date: 12/26/2019 CLINICAL DATA:  Pneumonia EXAM: PORTABLE CHEST 1 VIEW COMPARISON:  12/29/2019 FINDINGS: Similar hazy airspace opacity in the right lower lung with likely trace right pleural effusion. No pneumothorax. The left lung is clear. Cardiopericardial silhouette is similar in within normal limits. Calcific atherosclerosis of the aorta. Dual lead cardiac rhythm maintenance device in similar position. Polyarticular degenerative change. IMPRESSION: Similar findings suggestive of right lower lobe pneumonia and likely trace right pleural effusion. Electronically Signed   By: Margaretha Sheffield MD   On: 12/26/2019 13:45    Procedures Procedures (including critical care time)  Medications Ordered in ED Medications  sodium chloride 0.9 % bolus 500 mL ( Intravenous Stopped 12/26/19 1600)    ED Course  I have reviewed the triage vital signs and the nursing notes.  Pertinent labs & imaging results that were available during my care of the patient were reviewed by me and considered in my medical decision making (see chart for details).    MDM Rules/Calculators/A&P  86 year old male with dizziness, weakness, 2 episodes of syncope yesterday per facility [facility] On presentation,he  is alert, oriented, nontoxic-appearing, in no acute distress, following two-step commands without difficulty.  Vitals overall reassuring, patient is not hypotensive, tachypneic or tachycardic.  O2 sats at 99%.  Personally reviewed and interpreted his lab work CBC with a stable hemoglobin of 8.4.  Anemia panel consistent with iron  deficiency anemia as previously indicated.  CMP without any significant electrolyte abnormalities, normal renal function.  Normal LFTs.  Chest x-ray with stable pneumonia.  EKG sinus paced.  UA without evidence of UTI, with some ketones. Not complaining of any urinary symptoms.  Culture pending.  Folate normal, negative fecal occult test here.  Negative Covid.    Chest x-ray with stable.  Pneumonia, no evidence of progression.  Spoke with patient's PCP Dr. Lyndel Safe.  She is concerned that the patient has been symptomatic, 2 episodes of syncope and is concerned that he might be bleeding.  He has not followed up with GI.  Given that his hemoglobin is largely unchanged from previous, with negative Hemoccults for the last few days, we discussed outpatient GI follow-up.   Nursing staff reported dark urine.  Patient received 500 cc bolus.  Nursing staff attempted to ambulate the patient, though his mobility is limited.  He did not have any symptoms of dizziness or weakness with ambulation.  Given he is currently asymptomatic here in the ED, as discussed with the patient's PCP he is stable for discharge with GI follow-up.  Ambulatory referral placed.  Return precautions discussed, all the patient's workup with the patient's son Lesly Rubenstein, daughter Arby Barrette, wife and PCP.  Voiced understanding and are agreeable.  At this stage in the ED course, the patient medically screened and stable for discharge.  Final Clinical Impression(s) / ED Diagnoses Final diagnoses:  Iron deficiency anemia, unspecified iron deficiency anemia type    Rx / DC Orders ED Discharge Orders         Ordered    Ambulatory referral to Gastroenterology        12/26/19 1439           Lyndel Safe 12/26/19 1847    Carmin Muskrat, MD 12/29/19 [phone]

## 2019-12-27 ENCOUNTER — Encounter: Payer: Self-pay | Admitting: Internal Medicine

## 2019-12-27 ENCOUNTER — Non-Acute Institutional Stay (SKILLED_NURSING_FACILITY): Payer: Medicare Other | Admitting: Internal Medicine

## 2019-12-27 DIAGNOSIS — M2550 Pain in unspecified joint: Secondary | ICD-10-CM | POA: Diagnosis not present

## 2019-12-27 DIAGNOSIS — D509 Iron deficiency anemia, unspecified: Secondary | ICD-10-CM

## 2019-12-27 DIAGNOSIS — R55 Syncope and collapse: Secondary | ICD-10-CM

## 2019-12-27 DIAGNOSIS — Z7401 Bed confinement status: Secondary | ICD-10-CM | POA: Diagnosis not present

## 2019-12-27 DIAGNOSIS — I480 Paroxysmal atrial fibrillation: Secondary | ICD-10-CM | POA: Diagnosis not present

## 2019-12-27 DIAGNOSIS — R634 Abnormal weight loss: Secondary | ICD-10-CM | POA: Diagnosis not present

## 2019-12-27 DIAGNOSIS — R2689 Other abnormalities of gait and mobility: Secondary | ICD-10-CM | POA: Diagnosis not present

## 2019-12-27 DIAGNOSIS — R279 Unspecified lack of coordination: Secondary | ICD-10-CM | POA: Diagnosis not present

## 2019-12-27 DIAGNOSIS — I639 Cerebral infarction, unspecified: Secondary | ICD-10-CM | POA: Diagnosis not present

## 2019-12-27 DIAGNOSIS — I69354 Hemiplegia and hemiparesis following cerebral infarction affecting left non-dominant side: Secondary | ICD-10-CM | POA: Diagnosis not present

## 2019-12-27 DIAGNOSIS — M6281 Muscle weakness (generalized): Secondary | ICD-10-CM | POA: Diagnosis not present

## 2019-12-27 DIAGNOSIS — R1312 Dysphagia, oropharyngeal phase: Secondary | ICD-10-CM | POA: Diagnosis not present

## 2019-12-27 DIAGNOSIS — E039 Hypothyroidism, unspecified: Secondary | ICD-10-CM

## 2019-12-27 DIAGNOSIS — I63112 Cerebral infarction due to embolism of left vertebral artery: Secondary | ICD-10-CM

## 2019-12-27 DIAGNOSIS — J189 Pneumonia, unspecified organism: Secondary | ICD-10-CM | POA: Diagnosis not present

## 2019-12-27 DIAGNOSIS — I10 Essential (primary) hypertension: Secondary | ICD-10-CM

## 2019-12-27 LAB — URINE CULTURE: Culture: NO GROWTH

## 2019-12-27 NOTE — Progress Notes (Signed)
Location:  [facility] Room Number: 39-A Place of Service:  SNF [phone]) Provider:  [name], MD  Patient Care Team: [name], MD as PCP - General (Internal Medicine) [name], MD (Ophthalmology) [name], MD (Orthopedic Surgery) [name], MD (Cardiology) [name], MD (General Surgery) [name], MD [facility])  Extended Emergency Contact Information Primary Emergency Contact: [name],[name] Address: [place],  [address] [name] of [place] Phone: [phone] Mobile Phone: [phone] Relation: Spouse Secondary Emergency Contact: [name], [name] Mobile Phone: [phone] Relation: Son  Code Status:  FULL CODE Goals of care: Advanced Directive information Advanced Directives 12/26/2019  Does Patient Have a Medical Advance Directive? Yes  Type of Paramedic of [place];Living will  Does patient want to make changes to medical advance directive? No - Patient declined  Copy of [place] in Chart? -  Would patient like information on creating a medical advance directive? -     Chief Complaint  Patient presents with  . Acute Visit    Patient is seen for followup    HPI:  Pt is a 86 y.o. male seen today for an acute visit for Anemia and Weakness  Patient has history of Acute Right MCA infarct with left Sided weakness from 5/9-5/14.MRI scan showed right subcortical infarct most likely embolic etiology Patient has past medical history of hypertension, hypothyroidism, chronic diastolic CHF, permanent A. fib , traumatic ICH in January 2021 and Collesfracture of right radius, PPM implant.,History of PMR, hyperlipidemia Also Significant Weight loss of more then 20 lbs Recurent Pneumonia  Anemia Patient dropped his Hgb from 13.8 in 09/27/19 to 8.5 on 12/21/19 Has been Occult Negative  Iron studies show Iron Def Anemia Not  noticed any acute Bleeding Eliquis is on hold Syncope 2 Episodes of Syncope ? Etiology Does have PPP Weight loss with Dysphagia Continues to loose weight Has lost almost 20 lbs in past 3 months Recurrent Pneumonia Also has had 3 episodes of Pneumonia most likely aspiration Working with Speech but continues to not be Compliant with his Modified Diet S/P Stroke With Left Sided weakness Needs Assist with his ADLS Walk with the special walker and assist. Cognitively intact  Past Medical History:  Diagnosis Date  . AF (atrial fibrillation) [facility])    a. s/p RFCA/PVI @ [facility] clinic 2007.  . Arthritis    "joints" (06/21/2013)  . Atrial flutter ([place])    a. 06/2013 s/p TEE/DCCV  . Chronic systolic CHF (congestive heart failure) ([place])    a. 06/2013 Echo: EF 20%, diff HK, mild LVH.  Marland Kitchen History of cardiovascular stress test    Lexiscan Myoview 8/16:  EF 59%, attenuation artifact, no ischemia; Low Risk  . Hyperlipidemia   . Hypertension   . HYPERTENSION, BENIGN 08/21/2009   Qualifier: Diagnosis of  By: Caryl Comes, MD, Remus Blake  Medications(s)  enalapril   . Hypothyroidism   . Neoplasm of uncertain behavior    SKIN  . Polymyalgia rheumatica ([place]) 01/13/2007   Qualifier: History of  By: Danny Lawless CMA, [place]     . Presence of permanent cardiac pacemaker    Past Surgical History:  Procedure Laterality Date  . ATRIAL FIBRILLATION ABLATION  2007  . CARDIOVERSION N/A 06/22/2013   Procedure: CARDIOVERSION;  Surgeon: Sueanne Margarita, MD;  [facility]: [facility] ENDOSCOPY;  Service: Cardiovascular;  Laterality: N/A;  15:17 Lido 20mg ,IV , Propofol  30 mg, IV synched cardioversion @ 150 joules by Dr. Lajoyce Lauber from a flutter to sinis brady which is baseline for this pt, reports 45-50 reg HR verified by Dr. Ashok Norris  . CARDIOVERSION N/A 11/21/2014   Procedure: CARDIOVERSION;  Surgeon: Fay Records, MD;  Location: [facility] ENDOSCOPY;  Service: Cardiovascular;  Laterality: N/A;  . CARDIOVERSION N/A 07/14/2015    Procedure: CARDIOVERSION;  Surgeon: Sueanne Margarita, MD;  Location: [place];  Service: Cardiovascular;  Laterality: N/A;  . CATARACT EXTRACTION, BILATERAL Bilateral Summer 2009  . CHOLECYSTECTOMY    . EP IMPLANTABLE DEVICE N/A 11/10/2015   Procedure: Pacemaker Implant;  Surgeon: [name], MD;  Location: [place] CV LAB;  Service: Cardiovascular;  Laterality: N/A;  . HAND SURGERY Right    "broke bone; grafted area w/bone from somewhere else"  . HEMORRHOID SURGERY    . INGUINAL HERNIA REPAIR Bilateral   . TEE WITHOUT CARDIOVERSION N/A 06/22/2013   Procedure: TRANSESOPHAGEAL ECHOCARDIOGRAM (TEE);  Surgeon: Sueanne Margarita, MD;  Location: [facility] ENDOSCOPY;  Service: Cardiovascular;  Laterality: N/A;  . TEE WITHOUT CARDIOVERSION N/A 07/14/2015   Procedure: TRANSESOPHAGEAL ECHOCARDIOGRAM (TEE);  Surgeon: Sueanne Margarita, MD;  Location: [facility] ENDOSCOPY;  Service: Cardiovascular;  Laterality: N/A;  . TONSILLECTOMY      No Known Allergies  Outpatient Encounter Medications as of 12/27/2019  Medication Sig  . antiseptic oral rinse (BIOTENE) LIQD 20 mLs by Mouth Rinse route as needed for dry mouth.   Marland Kitchen apixaban (ELIQUIS) 5 MG TABS tablet Take 5 mg by mouth 2 (two) times daily.  Marland Kitchen atorvastatin (LIPITOR) 40 MG tablet Take 40 mg by mouth daily.  . carboxymethylcellulose (REFRESH PLUS) 0.5 % SOLN 1 drop every 4 (four) hours as needed. Each eye  . docusate sodium (COLACE) 100 MG capsule Take 200 mg by mouth daily.  . ferrous sulfate 325 (65 FE) MG EC tablet Take 325 mg by mouth daily.  Marland Kitchen glucosamine-chondroitin 500-400 MG tablet Take 1 tablet by mouth 3 (three) times daily.  Marland Kitchen levothyroxine (SYNTHROID) 50 MCG tablet Take 1 tablet (50 mcg total) by mouth daily before breakfast.  . mirtazapine (REMERON) 15 MG tablet Take 15 mg by mouth at bedtime.   . Multiple Vitamin (MULTIVITAMIN WITH MINERALS) TABS tablet Take 1 tablet by mouth daily. Centrum  . Nutritional Supplements (NUTRITIONAL SUPPLEMENT PO) Take  1 each by mouth daily.  . Nutritional Supplements (RESOURCE 2.0 PO) Take by mouth in the morning, at noon, and at bedtime.  . polyethylene glycol (MIRALAX / GLYCOLAX) 17 g packet Take 17 g by mouth daily. 17 gram/dose, oral, Once A Morning, Miralax 17 gm PO QD. Hold for diarrhea.  . sennosides-docusate sodium (SENOKOT-S) 8.6-50 MG tablet Take 2 tablets by mouth daily.  . [DISCONTINUED] doxycycline (VIBRAMYCIN) 100 MG capsule Take 1 capsule (100 mg total) by mouth 2 (two) times daily.   No facility-administered encounter medications on file as of 12/27/2019.    Review of Systems  Constitutional: Positive for activity change, appetite change and unexpected weight change.  HENT: Negative.   Respiratory: Positive for cough and shortness of breath.   Cardiovascular: Negative.   Gastrointestinal: Negative.   Genitourinary: Negative.   Musculoskeletal: Positive for gait problem.  Skin: Negative.   Neurological: Positive for weakness.  Psychiatric/Behavioral: Negative.     Immunization History  Administered Date(s) Administered  . Influenza Split 01/25/2012  . Influenza Whole 02/08/2008, 03/12/2009, 01/16/2010  . Influenza, High Dose Seasonal PF 01/24/2015, 03/04/2017, 02/16/2018, 12/29/2018  . Influenza,inj,Quad  PF,6+ Mos 01/25/2013, 01/23/2014  . Influenza-Unspecified 01/02/2016  . PFIZER SARS-COV-2 Vaccination 05/22/2019, 06/12/2019  . Pneumococcal Conjugate-13 01/25/2013  . Pneumococcal Polysaccharide-23 05/28/2016  . Td 01/16/2010  . Tdap 05/06/2019   Pertinent  Health Maintenance Due  Topic Date Due  . INFLUENZA VACCINE  12/02/2019  . PNA vac Low Risk Adult  Completed   Fall Risk  03/23/2019 03/17/2018 02/16/2018 05/28/2016 04/08/2016  Falls in the past year? 0 1 No No No  Comment Emmi Telephone Survey: data to providers prior to load [employer] Telephone Survey: data to providers prior to load - - Emmi Telephone Survey: data to providers prior to load  Number falls in past yr: - 1 -  - -  Comment - Emmi Telephone Survey Actual Response = 2 - - -  Injury with Fall? - 1 - - -   Functional Status Survey:    Vitals:   12/27/19 1423  BP: 127/84  Pulse: 82  Resp: 19  Temp: (!) 97.1 F (36.2 C)  TempSrc: Oral  SpO2: 95%  Weight: 124 lb 4.8 oz (56.4 kg)  Height: 5\' 9"  (1.753 m)   Body mass index is 18.36 kg/m. Physical Exam Vitals reviewed.  Constitutional:      Appearance: Normal appearance.  HENT:     Head: Normocephalic.     Nose: Nose normal.     Mouth/Throat:     Mouth: Mucous membranes are moist.     Pharynx: Oropharynx is clear.  Eyes:     Pupils: Pupils are equal, round, and reactive to light.  Cardiovascular:     Rate and Rhythm: Normal rate and regular rhythm.     Pulses: Normal pulses.  Pulmonary:     Effort: Pulmonary effort is normal.     Breath sounds: Rales present.  Abdominal:     General: Abdomen is flat. Bowel sounds are normal.     Palpations: Abdomen is soft.  Musculoskeletal:        General: No swelling.     Cervical back: Neck supple.     Comments: Left UE swelling  Skin:    General: Skin is warm.  Neurological:     Mental Status: He is alert and oriented to person, place, and time.     Comments: Has left Sided weakness especially Left UE  Psychiatric:        Mood and Affect: Mood normal.        Thought Content: Thought content normal.     Labs reviewed: Recent Labs    09/09/19 1138 09/10/19 0621 09/14/19 0301 09/27/19 0000 12/21/19 1741 12/25/19 2346 12/26/19 1318  NA 141   < > 141   < > 138 139 140  K 4.5   < > 4.2   < > 4.0 3.7 3.7  CL 104   < > 107   < > 102 103 101  CO2 28   < > 25   < > 27 28* 30  GLUCOSE 104*   < > 118*  --  104*  --  165*  BUN 16   < > 33*   < > 17 18 20   CREATININE 0.98   < > 1.14   < > 0.68 0.6 0.86  CALCIUM 9.3   < > 8.9   < > 8.5* 8.2* 8.6*  MG 2.0  --   --   --   --   --   --    < > = values in this interval not  displayed.   Recent Labs    09/09/19 1138 09/27/19 0000  12/21/19 1741 12/25/19 2346 12/26/19 1318  AST 21   < > 31 23 31   ALT 10   < > 13 9* 13  ALKPHOS 72   < > 75 75 76  BILITOT 1.0  --  0.7  --  0.7  PROT 6.6  --  7.3  --  6.8  ALBUMIN 3.8   < > 2.5* 2.4* 2.3*   < > = values in this interval not displayed.   Recent Labs    09/14/19 0301 09/27/19 0000 12/21/19 1741 12/25/19 2346 12/26/19 1318  WBC 5.4   < > 7.3 6.9 7.0  NEUTROABS  --    < > 6.2 5,886 6.0  HGB 12.9*   < > 8.4* 7.4* 8.5*  HCT 39.3   < > 27.5* 23* 28.0*  MCV 94.0  --  93.2  --  92.1  PLT 133*   < > 218 242 259   < > = values in this interval not displayed.   Lab Results  Component Value Date   TSH 3.939 09/12/2019   Lab Results  Component Value Date   HGBA1C 5.5 09/10/2019   Lab Results  Component Value Date   CHOL 133 09/10/2019   HDL 41 09/10/2019   LDLCALC 77 09/10/2019   TRIG 73 09/10/2019   CHOLHDL 3.2 09/10/2019    Significant Diagnostic Results in last 30 days:  Leanne Chang OP MEDICARE SPEECH PATH  Result Date: 12/14/2019 Objective Swallowing Evaluation: Type of Study: MBS-Modified Barium Swallow Study  Patient Details Name: [name] MRN: [id number] Date of Birth: 11-May-1925 Today's Date: 12/14/2019 Time: SLP Start Time (ACUTE ONLY): 1315 -SLP Stop Time (ACUTE ONLY): 1401 SLP Time Calculation (min) (ACUTE ONLY): 46 min Past Medical History: Past Medical History: Diagnosis Date . AF (atrial fibrillation) [facility])   a. s/p RFCA/PVI @ [facility] clinic 2007. . Arthritis   "joints" (06/21/2013) . Atrial flutter ([place])   a. 06/2013 s/p TEE/DCCV . Chronic systolic CHF (congestive heart failure) ([place])   a. 06/2013 Echo: EF 20%, diff HK, mild LVH. Marland Kitchen History of cardiovascular stress test   Lexiscan Myoview 8/16:  EF 59%, attenuation artifact, no ischemia; Low Risk . Hyperlipidemia  . Hypertension  . HYPERTENSION, BENIGN 08/21/2009  Qualifier: Diagnosis of  By: Caryl Comes, MD, Remus Blake  Medications(s)  enalapril  . Hypothyroidism  . Neoplasm of uncertain  behavior   SKIN . Polymyalgia rheumatica ([place]) 01/13/2007  Qualifier: History of  By: Danny Lawless CMA, [place]    . Presence of permanent cardiac pacemaker  Past Surgical History: Past Surgical History: Procedure Laterality Date . ATRIAL FIBRILLATION ABLATION  2007 . CARDIOVERSION N/A 06/22/2013  Procedure: CARDIOVERSION;  Surgeon: Sueanne Margarita, MD;  Location: [facility] ENDOSCOPY;  Service: Cardiovascular;  Laterality: N/A;  15:17 Lido 20mg ,IV , Propofol 30 mg, IV synched cardioversion @ 150 joules by Dr. Lajoyce Lauber from a flutter to sinis brady which is baseline for this pt, reports 45-50 reg HR verified by Dr. Ashok Norris . CARDIOVERSION N/A 11/21/2014  Procedure: CARDIOVERSION;  Surgeon: Fay Records, MD;  Location: [facility] ENDOSCOPY;  Service: Cardiovascular;  Laterality: N/A; . CARDIOVERSION N/A 07/14/2015  Procedure: CARDIOVERSION;  Surgeon: Sueanne Margarita, MD;  Location: [place];  Service: Cardiovascular;  Laterality: N/A; . CATARACT EXTRACTION, BILATERAL Bilateral Summer 2009 . CHOLECYSTECTOMY   . EP IMPLANTABLE DEVICE N/A 11/10/2015  Procedure: Pacemaker Implant;  Surgeon: [name], MD;  Location: [place] CV LAB;  Service: Cardiovascular;  Laterality: N/A; . HAND SURGERY Right   "broke bone; grafted area w/bone from somewhere else" . HEMORRHOID SURGERY   . INGUINAL HERNIA REPAIR Bilateral  . TEE WITHOUT CARDIOVERSION N/A 06/22/2013  Procedure: TRANSESOPHAGEAL ECHOCARDIOGRAM (TEE);  Surgeon: Sueanne Margarita, MD;  Location: [facility] ENDOSCOPY;  Service: Cardiovascular;  Laterality: N/A; . TEE WITHOUT CARDIOVERSION N/A 07/14/2015  Procedure: TRANSESOPHAGEAL ECHOCARDIOGRAM (TEE);  Surgeon: Sueanne Margarita, MD;  Location: [facility] ENDOSCOPY;  Service: Cardiovascular;  Laterality: N/A; . TONSILLECTOMY   HPI: HPI: 86 yo male admitted to [facility] with weakness, dysarthria and fall with concern for acute CVA.  Pt CT scan head showed chronic lacunar CVAs x2 basal ganglia, small right cerebellar CVA.  MRI showed insular CVA. Concern for worsening  symptoms, so new CT has been ordered to r/o extension.  Pt PMH + for HTN, hypothyroidism.    Pt with recent pna in July 2021.  He is being referred for repeat MBS to determine Least restrictive diet and to objectively evaluate swallowing.   Subjective: pt awake in chair Assessment / Plan / Recommendation CHL IP CLINICAL IMPRESSIONS 12/14/2019 Clinical Impression Patient presents with ongoing moderate oropharyngeal dysphagia with aspiration of thin liquids due to decreased timing of and adequacy of laryngeal elevation/closure.  Aspiration was sensed with reflexive cough noted but cough did not clear aspirates.  Oral control of barium was decreased due to pt's lingual weakness which results in premature spillage and decreased propulsion.  Piecemealing noted intermittently which was marginally delayed with pudding/nectar.   Pharyngeal swallow was inconsistent in performance, at times stronger and more efficient than others.  With cues for "fast and hard swallow" and chin tuck posture, pt able to protect his airway with nectar, pudding, cracker.   However despite use of chin tuck, pt still aspirated with thin when retained pudding barium noted in pharynx.  Single tsps thin with head neutral were not aspirated.  Of note, if pt could elicit swallow timing before head of bolus reached over epiglottis or pyriform sinus, he did not aspirate with thin. As, penetration and aspiration of thin occur due to decreased timing and adequacy of laryngeal closure.   Thin barium pooling at pyriform sinus before swallow triggered spilled into larynx/trachea with swallow initiation.  On the 15th and final bolus of the test *nectar*, pt noted to have swallow trigger at pyriform sinus at 2 seconds- oral residuals/piecemealed nectar bolus triggered at SEVEN secondary at pyriform sinus with pt needing verbal cues to swallow.  Suspect initiation difficulties are likely from his right frontal lobe cva impacting initiation and cerebellar/basal  ganglia CVA contribute to dysphagia.  Pt does present with cough with all aspiration episodes, thus advancing diet clinically may be feasible.  Pt verbalized to this SLP that he "does not like nor want to use the thickened drinks" and he further states "I've had pneumonia anyway" indicating this to be true despite following dietary restrictions.   Aspiration mitigation strategies in place but given pt's inconsisent swallow performance, barium retention and comorbidities, he will be chronic aspiration/aspiration pna risk.  Given pt did not aspirate with thin via tsp, provided him with a Provale cup to use for thins.  Advise addressing pt's wishes for dietary advancement with MDs, NPs, family and pt especially given his advanced age, comorbidities and wishes.  Recommend continue postures of chin tuck and effortful swallows.  Strengthening tongue base retraction and laryngeal elevation advised.  Pt informed to maintain strength of "  hock" to clear pharynx/valleculae and cough for airway clearance. He reported understanding to test results/compensation strategies and use of Provale cup.  Pt also would benefit from [employer] Protocol to help him maximize hydration and give him QOL.  Thanks for this consult.   SLP Visit Diagnosis Dysphagia, oropharyngeal phase (R13.12) Attention and concentration deficit following -- Frontal lobe and executive function deficit following -- Impact on safety and function Mild aspiration risk;Moderate aspiration risk;Risk for inadequate nutrition/hydration   CHL IP TREATMENT RECOMMENDATION 09/10/2019 Treatment Recommendations Defer until completion of intrumental exam   Prognosis 09/12/2019 Prognosis for Safe Diet Advancement Fair Barriers to Reach Goals Severity of deficits Barriers/Prognosis Comment -- CHL IP DIET RECOMMENDATION 12/14/2019 SLP Diet Recommendations Dysphagia 3 (Mech soft) solids;Nectar thick liquid;Thin liquid Liquid Administration via Cup;Straw;Other (Comment) Medication  Administration Other (Comment) crushed if large Compensations Slow rate;Small sips/bites;Follow solids with liquid;Effortful swallow;Chin tuck;Use straw to facilitate chin tuck, intermittent dry swallow and intermittent "hock" and swallow or expectorate Postural Changes Remain semi-upright after after feeds/meals (Comment);Seated upright at 90 degrees   CHL IP OTHER RECOMMENDATIONS 12/14/2019 Recommended Consults (No Data) Oral Care Recommendations -- Other Recommendations --   CHL IP FOLLOW UP RECOMMENDATIONS 09/13/2019 Follow up Recommendations Skilled Nursing facility   [facility] IP FREQUENCY AND DURATION 09/12/2019 Speech Therapy Frequency (ACUTE ONLY) min 2x/week Treatment Duration 1 week      CHL IP ORAL PHASE 12/14/2019 Oral Phase Impaired Oral - Pudding Teaspoon -- Oral - Pudding Cup -- Oral - Honey Teaspoon -- Oral - Honey Cup -- Oral - Nectar Teaspoon Weak lingual manipulation;Reduced posterior propulsion;Decreased bolus cohesion;Premature spillage Oral - Nectar Cup Weak lingual manipulation;Reduced posterior propulsion;Piecemeal swallowing;Decreased bolus cohesion;Premature spillage Oral - Nectar Straw Weak lingual manipulation;Reduced posterior propulsion;Piecemeal swallowing;Decreased bolus cohesion;Premature spillage Oral - Thin Teaspoon Weak lingual manipulation;Reduced posterior propulsion;Premature spillage Oral - Thin Cup Weak lingual manipulation;Reduced posterior propulsion;Piecemeal swallowing;Decreased bolus cohesion;Premature spillage Oral - Thin Straw Weak lingual manipulation;Reduced posterior propulsion;Piecemeal swallowing;Decreased bolus cohesion;Premature spillage Oral - Puree Weak lingual manipulation;Reduced posterior propulsion Oral - Mech Soft Impaired mastication;Weak lingual manipulation;Reduced posterior propulsion Oral - Regular -- Oral - Multi-Consistency -- Oral - Pill -- Oral Phase - Comment --  CHL IP PHARYNGEAL PHASE 12/14/2019 Pharyngeal Phase Impaired Pharyngeal- Pudding Teaspoon --  Pharyngeal -- Pharyngeal- Pudding Cup -- Pharyngeal -- Pharyngeal- Honey Teaspoon -- Pharyngeal -- Pharyngeal- Honey Cup -- Pharyngeal -- Pharyngeal- Nectar Teaspoon Delayed swallow initiation-pyriform sinuses Pharyngeal Material does not enter airway Pharyngeal- Nectar Cup Reduced epiglottic inversion;Reduced tongue base retraction;Pharyngeal residue - valleculae Pharyngeal Material does not enter airway Pharyngeal- Nectar Straw -- Pharyngeal -- Pharyngeal- Thin Teaspoon Delayed swallow initiation-vallecula;Reduced epiglottic inversion;Reduced laryngeal elevation;Reduced airway/laryngeal closure;Reduced tongue base retraction;Pharyngeal residue - valleculae Pharyngeal -- Pharyngeal- Thin Cup Reduced epiglottic inversion;Reduced laryngeal elevation;Reduced airway/laryngeal closure;Reduced tongue base retraction;Penetration/Aspiration during swallow;Pharyngeal residue - valleculae Pharyngeal Material enters airway, remains ABOVE vocal cords and not ejected out;Material enters airway, passes BELOW cords and not ejected out despite cough attempt by patient Pharyngeal- Thin Straw Delayed swallow initiation-vallecula;Reduced epiglottic inversion;Reduced laryngeal elevation;Pharyngeal residue - valleculae;Compensatory strategies attempted (with notebox) Pharyngeal -- Pharyngeal- Puree Delayed swallow initiation-vallecula;Reduced pharyngeal peristalsis;Reduced tongue base retraction;Pharyngeal residue - valleculae;Lateral channel residue Pharyngeal -- Pharyngeal- Mechanical Soft Delayed swallow initiation-vallecula;Reduced tongue base retraction;Pharyngeal residue - valleculae Pharyngeal -- Pharyngeal- Regular -- Pharyngeal -- Pharyngeal- Multi-consistency -- Pharyngeal -- Pharyngeal- Pill -- Pharyngeal -- Pharyngeal Comment --  CHL IP CERVICAL ESOPHAGEAL PHASE 12/14/2019 Cervical Esophageal Phase Impaired Pudding Teaspoon -- Pudding Cup -- Honey Teaspoon -- Honey Cup -- [employer]  Teaspoon -- Nectar Cup -- Nectar Straw -- Thin  Teaspoon -- Thin Cup -- Thin Straw -- Puree -- Mechanical Soft -- Regular -- Multi-consistency -- Pill -- Cervical Esophageal Comment -- Kathleen Lime, MS [facility] SLP Acute Rehab Services Office [phone] [name] 12/14/2019, 3:17 PM  CLINICAL DATA:  Dysphagia. Recent pneumonia despite intake restrictions. EXAM: MODIFIED BARIUM SWALLOW TECHNIQUE: Different consistencies of barium were administered orally to the patient by the Speech Pathologist. Imaging of the pharynx was performed in the lateral projection. The radiologist was present in the fluoroscopy room for this study, providing personal supervision. FLUOROSCOPY TIME:  Fluoroscopy Time:  1 minutes 48 seconds Radiation Exposure Index (if provided by the fluoroscopic device): 3.1 mGy Number of Acquired Spot Images: 0 COMPARISON:  None. FINDINGS: With thin barium, there was aspiration into the cervical trachea with a cough reflex. This improved with the chin tucked and did not occur when only a teaspoon of thin liquid was swallowed at a time. There was also mild vallecular pooling. There was flash laryngeal penetration and mild vallecular pooling with nectar. Vallecular pooling was demonstrated with puree. This did not clear with subsequent swallows of nectar. Vallecular pooling was demonstrated with cracker. IMPRESSION: 1. Aspiration with thin liquid with a cough reflex. This improved with the chin tucked and did not occur when only a teaspoon of thin liquid was swallowed at a time. 2. Flash laryngeal penetration and mild vallecular pooling with nectar. 3. Vallecular pulling with puree and cracker. Please refer to the Speech Pathologists report for complete details and recommendations. Electronically Signed   By: Claudie Revering M.D.   On: 12/14/2019 14:11   DG Chest Portable 1 View  Result Date: 12/26/2019 CLINICAL DATA:  Pneumonia EXAM: PORTABLE CHEST 1 VIEW COMPARISON:  12/29/2019 FINDINGS: Similar hazy airspace opacity in the right lower lung with likely  trace right pleural effusion. No pneumothorax. The left lung is clear. Cardiopericardial silhouette is similar in within normal limits. Calcific atherosclerosis of the aorta. Dual lead cardiac rhythm maintenance device in similar position. Polyarticular degenerative change. IMPRESSION: Similar findings suggestive of right lower lobe pneumonia and likely trace right pleural effusion. Electronically Signed   By: Margaretha Sheffield MD   On: 12/26/2019 13:45   DG Chest Port 1 View  Result Date: 12/21/2019 CLINICAL DATA:  Lethargy, pneumonia diagnosis 1 week ago, productive cough EXAM: PORTABLE CHEST 1 VIEW COMPARISON:  09/10/2019 FINDINGS: Single frontal view of the chest demonstrates right lower lobe consolidation and trace right effusion compatible with known history of pneumonia. No pneumothorax. Left chest is clear. Cardiac silhouette is unremarkable. Dual lead pacemaker is stable. IMPRESSION: 1. Right lower lobe pneumonia with trace right parapneumonic effusion. Electronically Signed   By: Randa Ngo M.D.   On: 12/21/2019 18:00    Assessment/Plan Iron deficiency anemia,  with Weight loss On Iron Urgent GI consult Cannot start on Eliquis till Hgb improves or know the Site of Bleed  Syncope, unspecified syncope type Unknown etiology Possible due to Anemia/ Vasovagal  Oropharyngeal dysphagia Continues to Loose weight  On Modified diet and working with Speech Therapy Pneumonia of right middle lobe due to Aspiration On 2 Antibiotics for 5 days continue to have Cough  Cerebrovascular accident (CVA) due to embolism of left vertebral artery ([place]) Off Eliquis due to Bleed Working with therapy Continue to need Assist with his ADLS  PAF (paroxysmal atrial fibrillation) ([place]) Rate Controlled  Essential hypertension Off all his Meds  Hypothyroidism, unspecified type TSH normal in 8/21  Family/ staff Communication:  Labs/tests ordered: CBC in 1 week  Total time spent in this  patient care encounter was  45_  minutes; greater than 50% of the visit spent counseling patient and staff, reviewing records , Labs and coordinating care for problems addressed at this encounter.

## 2019-12-28 ENCOUNTER — Other Ambulatory Visit: Payer: Self-pay

## 2019-12-28 ENCOUNTER — Telehealth: Payer: Self-pay

## 2019-12-28 ENCOUNTER — Non-Acute Institutional Stay: Payer: Medicare Other

## 2019-12-28 DIAGNOSIS — Z515 Encounter for palliative care: Secondary | ICD-10-CM

## 2019-12-28 DIAGNOSIS — R279 Unspecified lack of coordination: Secondary | ICD-10-CM | POA: Diagnosis not present

## 2019-12-28 DIAGNOSIS — R1312 Dysphagia, oropharyngeal phase: Secondary | ICD-10-CM | POA: Diagnosis not present

## 2019-12-28 DIAGNOSIS — R2689 Other abnormalities of gait and mobility: Secondary | ICD-10-CM | POA: Diagnosis not present

## 2019-12-28 DIAGNOSIS — M6281 Muscle weakness (generalized): Secondary | ICD-10-CM | POA: Diagnosis not present

## 2019-12-28 DIAGNOSIS — I69354 Hemiplegia and hemiparesis following cerebral infarction affecting left non-dominant side: Secondary | ICD-10-CM | POA: Diagnosis not present

## 2019-12-28 DIAGNOSIS — I639 Cerebral infarction, unspecified: Secondary | ICD-10-CM | POA: Diagnosis not present

## 2019-12-28 NOTE — Telephone Encounter (Signed)
(  2:43p) SW left a message for patient's son and wife, requesting a call back.

## 2019-12-30 NOTE — Progress Notes (Signed)
COMMUNITY PALLIATIVE CARE SW NOTE  PATIENT NAME: [name] DOB: 08-Sep-1925 MRN: [id number]  PRIMARY CARE PROVIDER: [name], MD  RESPONSIBLE PARTY:  Acct ID - Guarantor Home Phone Work Phone Relationship Acct Type  [id number] CALIL, [phone]  Self P/F     [address], [place], [place] [id number]     PLAN OF CARE and INTERVENTIONS:             1. GOALS OF CARE/ ADVANCE CARE PLANNING:  Goal is for patient to return to his apartment with his wife. He is a full code. 2. SOCIAL/EMOTIONAL/SPIRITUAL ASSESSMENT/ INTERVENTIONS:  SW and RN-Julie Therapist, occupational (training) met with patient at the facility ([facility]) for the initial face-to-face visit. SW provided education regarding the palliative care program, services, role in his care and the support that is available. Patient was in bed. He was awake, alert and receptive to the palliative care team ad services. Patient was advised that his goal was to get better and return to his apartment with his wife. He gave medical and social background on himself. He shared that he and his wife moved into an independent apartment in May and two days later he had stroke and later pneumonia.  He stated that since that time, he has declined where he is unable to walk, he lost weight, had difficulty swallowing. Patient is currently on a thickened liquid and puree diet. He stated that the change in diet has affected his appetite because he does not like the food and liquid consistency, so he does not eat. Patient advised that he would like the team to help him with his appetite and to get back to his apartment. SW advised that the team will do their best to support. SW consulted with the facility LPN-Godwin and DON-Frieda regarding patient status. Patient has lost 15/20 lbs. He is not eating. He had a recent emergency room after his hemoglobin dropped. He was seen in the ED and returned back to the facility. The staff feels that  patient is struggling with his limitations and overall decline and waivers between the two.  Patient was born and raised in [place], [place]. 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 He has been married for 68 years and have three children. He is Tourist information centre manager by faith. SW provided education, supportive presence, supportive counseling, active listening, observation, consultation with staff and chart review. SW also left a message for patient's son and wife. SW to continue to provide support and psychosocial assessment of needs.  3. PATIENT/CAREGIVER EDUCATION/ COPING:  Patient is alert and oriented to self and situation, but appears to have some cognitive deficits as he repeated himself several times and had difficulty completing his thoughts. Patient seems to be struggling with his prognosis. 4. PERSONAL EMERGENCY PLAN: Per facility protocol. 5. COMMUNITY RESOURCES COORDINATION/ HEALTH CARE NAVIGATION:  Patient is receiving physical and speech therapy.  6. FINANCIAL/LEGAL CONCERNS/INTERVENTIONS:  None.     SOCIAL HX:  Social History   Tobacco Use  . Smoking status: Former Smoker    Packs/day: 1.00    Years: 12.00    Pack years: 12.00    Types: Cigarettes    Quit date: 05/04/1963    Years since quitting: 56.6  . Smokeless tobacco: Never Used  . Tobacco comment: quit cigars 82'   Substance Use Topics  . Alcohol use: Yes    Comment: 06/21/2013 "I was a moderate drinker at most; rarely have  drink now"    CODE STATUS: FULL CODE ADVANCED DIRECTIVES: Yes MOST FORM COMPLETE:  No, to be assessed HOSPICE EDUCATION PROVIDED: No  PPS: Patient ambulates with 1/2 person assist. He is on thickened liquids and puree diet. Cognitive deficits are present.   Duration of visit and documentation: 60 minutes    Katheren Puller, LCSW

## 2019-12-31 DIAGNOSIS — R1312 Dysphagia, oropharyngeal phase: Secondary | ICD-10-CM | POA: Diagnosis not present

## 2019-12-31 DIAGNOSIS — I639 Cerebral infarction, unspecified: Secondary | ICD-10-CM | POA: Diagnosis not present

## 2019-12-31 DIAGNOSIS — R2689 Other abnormalities of gait and mobility: Secondary | ICD-10-CM | POA: Diagnosis not present

## 2019-12-31 DIAGNOSIS — M6281 Muscle weakness (generalized): Secondary | ICD-10-CM | POA: Diagnosis not present

## 2019-12-31 DIAGNOSIS — I69354 Hemiplegia and hemiparesis following cerebral infarction affecting left non-dominant side: Secondary | ICD-10-CM | POA: Diagnosis not present

## 2019-12-31 DIAGNOSIS — R279 Unspecified lack of coordination: Secondary | ICD-10-CM | POA: Diagnosis not present

## 2020-01-01 DIAGNOSIS — I69354 Hemiplegia and hemiparesis following cerebral infarction affecting left non-dominant side: Secondary | ICD-10-CM | POA: Diagnosis not present

## 2020-01-01 DIAGNOSIS — R1312 Dysphagia, oropharyngeal phase: Secondary | ICD-10-CM | POA: Diagnosis not present

## 2020-01-01 DIAGNOSIS — M6281 Muscle weakness (generalized): Secondary | ICD-10-CM | POA: Diagnosis not present

## 2020-01-01 DIAGNOSIS — R2689 Other abnormalities of gait and mobility: Secondary | ICD-10-CM | POA: Diagnosis not present

## 2020-01-01 DIAGNOSIS — I639 Cerebral infarction, unspecified: Secondary | ICD-10-CM | POA: Diagnosis not present

## 2020-01-01 DIAGNOSIS — R279 Unspecified lack of coordination: Secondary | ICD-10-CM | POA: Diagnosis not present

## 2020-01-03 ENCOUNTER — Encounter: Payer: Self-pay | Admitting: Internal Medicine

## 2020-01-03 ENCOUNTER — Non-Acute Institutional Stay (SKILLED_NURSING_FACILITY): Payer: Medicare Other | Admitting: Internal Medicine

## 2020-01-03 DIAGNOSIS — I480 Paroxysmal atrial fibrillation: Secondary | ICD-10-CM | POA: Diagnosis not present

## 2020-01-03 DIAGNOSIS — Z66 Do not resuscitate: Secondary | ICD-10-CM

## 2020-01-03 DIAGNOSIS — I69354 Hemiplegia and hemiparesis following cerebral infarction affecting left non-dominant side: Secondary | ICD-10-CM | POA: Diagnosis not present

## 2020-01-03 DIAGNOSIS — Z5181 Encounter for therapeutic drug level monitoring: Secondary | ICD-10-CM | POA: Diagnosis not present

## 2020-01-03 DIAGNOSIS — D509 Iron deficiency anemia, unspecified: Secondary | ICD-10-CM | POA: Diagnosis not present

## 2020-01-03 DIAGNOSIS — I10 Essential (primary) hypertension: Secondary | ICD-10-CM | POA: Diagnosis not present

## 2020-01-03 DIAGNOSIS — R634 Abnormal weight loss: Secondary | ICD-10-CM

## 2020-01-03 DIAGNOSIS — R1312 Dysphagia, oropharyngeal phase: Secondary | ICD-10-CM

## 2020-01-03 DIAGNOSIS — R6889 Other general symptoms and signs: Secondary | ICD-10-CM | POA: Diagnosis not present

## 2020-01-03 DIAGNOSIS — J189 Pneumonia, unspecified organism: Secondary | ICD-10-CM

## 2020-01-03 DIAGNOSIS — E039 Hypothyroidism, unspecified: Secondary | ICD-10-CM

## 2020-01-03 DIAGNOSIS — I5022 Chronic systolic (congestive) heart failure: Secondary | ICD-10-CM | POA: Diagnosis not present

## 2020-01-03 DIAGNOSIS — Z79899 Other long term (current) drug therapy: Secondary | ICD-10-CM | POA: Diagnosis not present

## 2020-01-03 DIAGNOSIS — F339 Major depressive disorder, recurrent, unspecified: Secondary | ICD-10-CM

## 2020-01-03 DIAGNOSIS — I744 Embolism and thrombosis of arteries of extremities, unspecified: Secondary | ICD-10-CM | POA: Diagnosis not present

## 2020-01-03 LAB — CBC AND DIFFERENTIAL
HCT: 25 — AB (ref 41–53)
Hemoglobin: 7.8 — AB (ref 13.5–17.5)
Platelets: 276 (ref 150–399)
WBC: 5.9

## 2020-01-03 LAB — CBC: RBC: 2.87 — AB (ref 3.87–5.11)

## 2020-01-03 NOTE — Progress Notes (Signed)
Location:   [facility] Room Number: 30 Place of Service:  SNF [phone]) Provider:  Veleta Miners, MD  [name], MD  Patient Care Team: [name], MD as PCP - General (Internal Medicine) [name], MD (Ophthalmology) [name], MD (Orthopedic Surgery) [name], MD (Cardiology) [name], MD (General Surgery) [name], MD [facility])  Extended Emergency Contact Information Primary Emergency Contact: Beauchaine,[name] Address: [place], [place] [address] [name] of [place] Phone: [phone] Mobile Phone: [phone] Relation: Spouse Secondary Emergency Contact: Adelfo, Diebel Mobile Phone: [phone] Relation: Son  Code Status:  DNR Goals of care: Advanced Directive information Advanced Directives 01/03/2020  Does Patient Have a Medical Advance Directive? Yes  Type of Advance Directive Living will  Does patient want to make changes to medical advance directive? No - Patient declined  Copy of [place] in Chart? Yes - validated most recent copy scanned in chart (See row information)  Would patient like information on creating a medical advance directive? -     Chief Complaint  Patient presents with  . Acute Visit    Dysphagia    HPI:  Pt is a 86 y.o. male seen today for Acute Visit  Patient has history ofAcute Right MCA infarct with left Sided weakness from 5/9-5/14.MRI scan showed right subcortical infarct most likely embolic etiology Patient has past medical history of hypertension, hypothyroidism, chronic diastolic CHF, permanent A. fib , traumatic ICH in January 2021 and Collesfracture of right radius, PPM implant.,History of PMR, hyperlipidemia Also Significant Weight loss of more then 20 lbs Recurent Pneumonia  Dysphagia Patient has lost weight almost 20 pounds. Is unable to tolerate thick liquids .  Is not working with speech and is very  upset that he cannot have regular diet.  He states his quality of life depends upon eating thin liquids. And Regular Diet Recurrent pneumonia Has had 3 episodes of pneumonia most likely due to aspiration.  Continues to have chronic cough and congestion. Anemia Hemoglobin dropped from 13-8.5.  And today it was 7.8 Continues on iron has been occult negative.  Family decided not to do GI yet did have talked to palliative care. S/p stroke Continues to have left-sided weakness and needing help with his ADLs. Eliquis on Hold Due to Anemia Past Medical History:  Diagnosis Date  . AF (atrial fibrillation) [facility])    a. s/p RFCA/PVI @ [facility] clinic 2007.  . Arthritis    "joints" (06/21/2013)  . Atrial flutter ([place])    a. 06/2013 s/p TEE/DCCV  . Chronic systolic CHF (congestive heart failure) ([place])    a. 06/2013 Echo: EF 20%, diff HK, mild LVH.  Marland Kitchen History of cardiovascular stress test    Lexiscan Myoview 8/16:  EF 59%, attenuation artifact, no ischemia; Low Risk  . Hyperlipidemia   . Hypertension   . HYPERTENSION, BENIGN 08/21/2009   Qualifier: Diagnosis of  By: Caryl Comes, MD, Remus Blake  Medications(s)  enalapril   . Hypothyroidism   . Neoplasm of uncertain behavior    SKIN  . Polymyalgia rheumatica ([place]) 01/13/2007   Qualifier: History of  By: Danny Lawless CMA, [place]     . Presence of permanent cardiac pacemaker    Past Surgical History:  Procedure Laterality Date  . ATRIAL FIBRILLATION ABLATION  2007  . CARDIOVERSION N/A 06/22/2013   Procedure: CARDIOVERSION;  Surgeon: Sueanne Margarita, MD;  [facility] END[facility];  Service: Cardiovascular;  Laterality: N/A;  15:17 Lido 20mg ,IV , Propofol 30 mg, IV synched cardioversion @ 150 joules by Dr. Lajoyce Lauber from a flutter to sinis brady which is baseline for this pt, reports 45-50 reg HR verified by Dr. Ashok Norris  . CARDIOVERSION N/A 11/21/2014   Procedure: CARDIOVERSION;  Surgeon: Fay Records, MD;  Location: [facility] ENDOSCOPY;  Service: Cardiovascular;   Laterality: N/A;  . CARDIOVERSION N/A 07/14/2015   Procedure: CARDIOVERSION;  Surgeon: Sueanne Margarita, MD;  Location: [place];  Service: Cardiovascular;  Laterality: N/A;  . CATARACT EXTRACTION, BILATERAL Bilateral Summer 2009  . CHOLECYSTECTOMY    . EP IMPLANTABLE DEVICE N/A 11/10/2015   Procedure: Pacemaker Implant;  Surgeon: [name], MD;  Location: [place] CV LAB;  Service: Cardiovascular;  Laterality: N/A;  . HAND SURGERY Right    "broke bone; grafted area w/bone from somewhere else"  . HEMORRHOID SURGERY    . INGUINAL HERNIA REPAIR Bilateral   . TEE WITHOUT CARDIOVERSION N/A 06/22/2013   Procedure: TRANSESOPHAGEAL ECHOCARDIOGRAM (TEE);  Surgeon: Sueanne Margarita, MD;  Location: [facility] ENDOSCOPY;  Service: Cardiovascular;  Laterality: N/A;  . TEE WITHOUT CARDIOVERSION N/A 07/14/2015   Procedure: TRANSESOPHAGEAL ECHOCARDIOGRAM (TEE);  Surgeon: Sueanne Margarita, MD;  Location: [facility] ENDOSCOPY;  Service: Cardiovascular;  Laterality: N/A;  . TONSILLECTOMY      No Known Allergies  Allergies as of 01/03/2020   No Known Allergies     Medication List       Accurate as of January 03, 2020  2:48 PM. If you have any questions, ask your nurse or doctor.        antiseptic oral rinse Liqd 20 mLs by Mouth Rinse route as needed for dry mouth.   atorvastatin 40 MG tablet Commonly known as: LIPITOR Take 40 mg by mouth daily.   carboxymethylcellulose 0.5 % Soln Commonly known as: REFRESH PLUS 1 drop every 4 (four) hours as needed. Each eye   docusate sodium 100 MG capsule Commonly known as: COLACE Take 200 mg by mouth daily.   ferrous sulfate 325 (65 FE) MG EC tablet Take 325 mg by mouth daily.   glucosamine-chondroitin 500-400 MG tablet Take 1 tablet by mouth 3 (three) times daily.   levothyroxine 50 MCG tablet Commonly known as: SYNTHROID Take 1 tablet (50 mcg total) by mouth daily before breakfast.   mirtazapine 15 MG tablet Commonly known as: REMERON Take 15 mg by mouth  at bedtime.   multivitamin with minerals Tabs tablet Take 1 tablet by mouth daily. Centrum   polyethylene glycol 17 g packet Commonly known as: MIRALAX / GLYCOLAX Take 17 g by mouth daily. 17 gram/dose, oral, Once A Morning, Miralax 17 gm PO QD. Hold for diarrhea.   RESOURCE 2.0 PO Take by mouth in the morning, at noon, and at bedtime. What changed: Another medication with the same name was removed. Continue taking this medication, and follow the directions you see here. Changed by: [name], MD   sennosides-docusate sodium 8.6-50 MG tablet Commonly known as: SENOKOT-S Take 2 tablets by mouth daily.       Review of Systems  Constitutional: Positive for activity change, appetite change and unexpected weight change.  HENT: Positive for congestion.   Respiratory: Positive for cough.   Cardiovascular: Negative.   Gastrointestinal: Negative.   Genitourinary: Negative.   Musculoskeletal: Positive for gait problem.  Neurological: Positive for weakness.  Psychiatric/Behavioral: Positive for behavioral problems.    Immunization History  Administered Date(s) Administered  . Influenza Split 01/25/2012  . Influenza Whole 02/08/2008, 03/12/2009, 01/16/2010  . Influenza, High Dose Seasonal PF 01/24/2015, 03/04/2017, 02/16/2018, 12/29/2018  . Influenza,inj,Quad PF,6+ Mos 01/25/2013, 01/23/2014  . Influenza-Unspecified 01/02/2016  . PFIZER SARS-COV-2 Vaccination 05/22/2019, 06/12/2019  . Pneumococcal Conjugate-13 01/25/2013  . Pneumococcal Polysaccharide-23 05/28/2016  . Td 01/16/2010  . Tdap 05/06/2019   Pertinent  Health Maintenance Due  Topic Date Due  . INFLUENZA VACCINE  12/02/2019  . PNA vac Low Risk Adult  Completed   Fall Risk  03/23/2019 03/17/2018 02/16/2018 05/28/2016 04/08/2016  Falls in the past year? 0 1 No No No  Comment [facility] Telephone Survey: data to providers prior to load [employer] Telephone Survey: data to providers prior to load - - Emmi Telephone Survey: data  to providers prior to load  Number falls in past yr: - 1 - - -  Comment - Emmi Telephone Survey Actual Response = 2 - - -  Injury with Fall? - 1 - - -   Functional Status Survey:    Vitals:   01/03/20 1438  BP: 118/76  Pulse: 62  Resp: 19  Temp: 97.6 F (36.4 C)  SpO2: 94%  Weight: 125 lb 3.2 oz (56.8 kg)  Height: 5\' 9"  (1.753 m)   Body mass index is 18.49 kg/m. Physical Exam Vitals reviewed.  Constitutional:      Appearance: Normal appearance.  HENT:     Head: Normocephalic.     Nose: Nose normal.     Mouth/Throat:     Mouth: Mucous membranes are moist.     Pharynx: Oropharynx is clear.  Eyes:     Pupils: Pupils are equal, round, and reactive to light.  Cardiovascular:     Rate and Rhythm: Regular rhythm.     Pulses: Normal pulses.  Pulmonary:     Effort: Pulmonary effort is normal.     Comments: Has Rales in Right Lower Lung  Abdominal:     General: Abdomen is flat. Bowel sounds are normal.     Palpations: Abdomen is soft.  Musculoskeletal:        General: No swelling.     Cervical back: Neck supple.  Skin:    General: Skin is warm.  Neurological:     Mental Status: He is alert and oriented to person, place, and time.     Comments: Has left Sided weakness especially Left UE   Psychiatric:        Mood and Affect: Mood normal.        Thought Content: Thought content normal.     Labs reviewed: Recent Labs    09/09/19 1138 09/10/19 0621 09/14/19 0301 09/27/19 0000 12/21/19 1741 12/25/19 2346 12/26/19 1318  NA 141   < > 141   < > 138 139 140  K 4.5   < > 4.2   < > 4.0 3.7 3.7  CL 104   < > 107   < > 102 103 101  CO2 28   < > 25   < > 27 28* 30  GLUCOSE 104*   < > 118*  --  104*  --  165*  BUN 16   < > 33*   < > 17 18 20   CREATININE 0.98   < > 1.14   < > 0.68 0.6 0.86  CALCIUM 9.3   < > 8.9   < > 8.5* 8.2* 8.6*  MG 2.0  --   --   --   --   --   --    < > =  values in this interval not displayed.   Recent Labs    09/09/19 1138 09/27/19 0000  12/21/19 1741 12/25/19 2346 12/26/19 1318  AST 21   < > 31 23 31   ALT 10   < > 13 9* 13  ALKPHOS 72   < > 75 75 76  BILITOT 1.0  --  0.7  --  0.7  PROT 6.6  --  7.3  --  6.8  ALBUMIN 3.8   < > 2.5* 2.4* 2.3*   < > = values in this interval not displayed.   Recent Labs    09/14/19 0301 09/27/19 0000 12/21/19 1741 12/25/19 2346 12/26/19 1318  WBC 5.4   < > 7.3 6.9 7.0  NEUTROABS  --    < > 6.2 5,886 6.0  HGB 12.9*   < > 8.4* 7.4* 8.5*  HCT 39.3   < > 27.5* 23* 28.0*  MCV 94.0  --  93.2  --  92.1  PLT 133*   < > 218 242 259   < > = values in this interval not displayed.   Lab Results  Component Value Date   TSH 2.50 12/20/2019   Lab Results  Component Value Date   HGBA1C 5.5 09/10/2019   Lab Results  Component Value Date   CHOL 133 09/10/2019   HDL 41 09/10/2019   LDLCALC 77 09/10/2019   TRIG 73 09/10/2019   CHOLHDL 3.2 09/10/2019    Significant Diagnostic Results in last 30 days:  Leanne Chang OP MEDICARE SPEECH PATH  Result Date: 12/14/2019 Objective Swallowing Evaluation: Type of Study: MBS-Modified Barium Swallow Study  Patient Details Name: RIGEL FILSINGER MRN: [id number] Date of Birth: 1925-06-20 Today's Date: 12/14/2019 Time: SLP Start Time (ACUTE ONLY): 1315 -SLP Stop Time (ACUTE ONLY): 1401 SLP Time Calculation (min) (ACUTE ONLY): 46 min Past Medical History: Past Medical History: Diagnosis Date . AF (atrial fibrillation) [facility])   a. s/p RFCA/PVI @ [facility] clinic 2007. . Arthritis   "joints" (06/21/2013) . Atrial flutter ([place])   a. 06/2013 s/p TEE/DCCV . Chronic systolic CHF (congestive heart failure) ([place])   a. 06/2013 Echo: EF 20%, diff HK, mild LVH. Marland Kitchen History of cardiovascular stress test   Lexiscan Myoview 8/16:  EF 59%, attenuation artifact, no ischemia; Low Risk . Hyperlipidemia  . Hypertension  . HYPERTENSION, BENIGN 08/21/2009  Qualifier: Diagnosis of  By: Caryl Comes, MD, Remus Blake  Medications(s)  enalapril  . Hypothyroidism  . Neoplasm of uncertain  behavior   SKIN . Polymyalgia rheumatica ([place]) 01/13/2007  Qualifier: History of  By: Danny Lawless CMA, [place]    . Presence of permanent cardiac pacemaker  Past Surgical History: Past Surgical History: Procedure Laterality Date . ATRIAL FIBRILLATION ABLATION  2007 . CARDIOVERSION N/A 06/22/2013  Procedure: CARDIOVERSION;  Surgeon: Sueanne Margarita, MD;  [facility] ENDOSCOPY;  Service: Cardiovascular;  Laterality: N/A;  15:17 Lido 20mg ,IV , Propofol 30 mg, IV synched cardioversion @ 150 joules by Dr. Lajoyce Lauber from a flutter to sinis brady which is baseline for this pt, reports 45-50 reg HR verified by Dr. Ashok Norris . CARDIOVERSION N/A 11/21/2014  Procedure: CARDIOVERSION;  Surgeon: Fay Records, MD;  Location: [facility] ENDOSCOPY;  Service: Cardiovascular;  Laterality: N/A; . CARDIOVERSION N/A 07/14/2015  Procedure: CARDIOVERSION;  Surgeon: Sueanne Margarita, MD;  Location: [place];  Service: Cardiovascular;  Laterality: N/A; . CATARACT EXTRACTION, BILATERAL Bilateral Summer 2009 . CHOLECYSTECTOMY   . EP IMPLANTABLE DEVICE N/A 11/10/2015  Procedure: Pacemaker Implant;  Surgeon:  [name], MD;  Location: [place] CV LAB;  Service: Cardiovascular;  Laterality: N/A; . HAND SURGERY Right   "broke bone; grafted area w/bone from somewhere else" . HEMORRHOID SURGERY   . INGUINAL HERNIA REPAIR Bilateral  . TEE WITHOUT CARDIOVERSION N/A 06/22/2013  Procedure: TRANSESOPHAGEAL ECHOCARDIOGRAM (TEE);  Surgeon: Sueanne Margarita, MD;  Location: [facility] ENDOSCOPY;  Service: Cardiovascular;  Laterality: N/A; . TEE WITHOUT CARDIOVERSION N/A 07/14/2015  Procedure: TRANSESOPHAGEAL ECHOCARDIOGRAM (TEE);  Surgeon: Sueanne Margarita, MD;  Location: [facility] ENDOSCOPY;  Service: Cardiovascular;  Laterality: N/A; . TONSILLECTOMY   HPI: HPI: 86 yo male admitted to [facility] with weakness, dysarthria and fall with concern for acute CVA.  Pt CT scan head showed chronic lacunar CVAs x2 basal ganglia, small right cerebellar CVA.  MRI showed insular CVA. Concern for worsening  symptoms, so new CT has been ordered to r/o extension.  Pt PMH + for HTN, hypothyroidism.    Pt with recent pna in July 2021.  He is being referred for repeat MBS to determine Least restrictive diet and to objectively evaluate swallowing.   Subjective: pt awake in chair Assessment / Plan / Recommendation CHL IP CLINICAL IMPRESSIONS 12/14/2019 Clinical Impression Patient presents with ongoing moderate oropharyngeal dysphagia with aspiration of thin liquids due to decreased timing of and adequacy of laryngeal elevation/closure.  Aspiration was sensed with reflexive cough noted but cough did not clear aspirates.  Oral control of barium was decreased due to pt's lingual weakness which results in premature spillage and decreased propulsion.  Piecemealing noted intermittently which was marginally delayed with pudding/nectar.   Pharyngeal swallow was inconsistent in performance, at times stronger and more efficient than others.  With cues for "fast and hard swallow" and chin tuck posture, pt able to protect his airway with nectar, pudding, cracker.   However despite use of chin tuck, pt still aspirated with thin when retained pudding barium noted in pharynx.  Single tsps thin with head neutral were not aspirated.  Of note, if pt could elicit swallow timing before head of bolus reached over epiglottis or pyriform sinus, he did not aspirate with thin. As, penetration and aspiration of thin occur due to decreased timing and adequacy of laryngeal closure.   Thin barium pooling at pyriform sinus before swallow triggered spilled into larynx/trachea with swallow initiation.  On the 15th and final bolus of the test *nectar*, pt noted to have swallow trigger at pyriform sinus at 2 seconds- oral residuals/piecemealed nectar bolus triggered at SEVEN secondary at pyriform sinus with pt needing verbal cues to swallow.  Suspect initiation difficulties are likely from his right frontal lobe cva impacting initiation and cerebellar/basal  ganglia CVA contribute to dysphagia.  Pt does present with cough with all aspiration episodes, thus advancing diet clinically may be feasible.  Pt verbalized to this SLP that he "does not like nor want to use the thickened drinks" and he further states "I've had pneumonia anyway" indicating this to be true despite following dietary restrictions.   Aspiration mitigation strategies in place but given pt's inconsisent swallow performance, barium retention and comorbidities, he will be chronic aspiration/aspiration pna risk.  Given pt did not aspirate with thin via tsp, provided him with a Provale cup to use for thins.  Advise addressing pt's wishes for dietary advancement with MDs, NPs, family and pt especially given his advanced age, comorbidities and wishes.  Recommend continue postures of chin tuck and effortful swallows.  Strengthening tongue base retraction and laryngeal elevation advised.  Pt  informed to maintain strength of "hock" to clear pharynx/valleculae and cough for airway clearance. He reported understanding to test results/compensation strategies and use of Provale cup.  Pt also would benefit from [employer] Protocol to help him maximize hydration and give him QOL.  Thanks for this consult.   SLP Visit Diagnosis Dysphagia, oropharyngeal phase (R13.12) Attention and concentration deficit following -- Frontal lobe and executive function deficit following -- Impact on safety and function Mild aspiration risk;Moderate aspiration risk;Risk for inadequate nutrition/hydration   CHL IP TREATMENT RECOMMENDATION 09/10/2019 Treatment Recommendations Defer until completion of intrumental exam   Prognosis 09/12/2019 Prognosis for Safe Diet Advancement Fair Barriers to Reach Goals Severity of deficits Barriers/Prognosis Comment -- CHL IP DIET RECOMMENDATION 12/14/2019 SLP Diet Recommendations Dysphagia 3 (Mech soft) solids;Nectar thick liquid;Thin liquid Liquid Administration via Cup;Straw;Other (Comment) Medication  Administration Other (Comment) crushed if large Compensations Slow rate;Small sips/bites;Follow solids with liquid;Effortful swallow;Chin tuck;Use straw to facilitate chin tuck, intermittent dry swallow and intermittent "hock" and swallow or expectorate Postural Changes Remain semi-upright after after feeds/meals (Comment);Seated upright at 90 degrees   CHL IP OTHER RECOMMENDATIONS 12/14/2019 Recommended Consults (No Data) Oral Care Recommendations -- Other Recommendations --   CHL IP FOLLOW UP RECOMMENDATIONS 09/13/2019 Follow up Recommendations Skilled Nursing facility   [facility] IP FREQUENCY AND DURATION 09/12/2019 Speech Therapy Frequency (ACUTE ONLY) min 2x/week Treatment Duration 1 week      CHL IP ORAL PHASE 12/14/2019 Oral Phase Impaired Oral - Pudding Teaspoon -- Oral - Pudding Cup -- Oral - Honey Teaspoon -- Oral - Honey Cup -- Oral - Nectar Teaspoon Weak lingual manipulation;Reduced posterior propulsion;Decreased bolus cohesion;Premature spillage Oral - Nectar Cup Weak lingual manipulation;Reduced posterior propulsion;Piecemeal swallowing;Decreased bolus cohesion;Premature spillage Oral - Nectar Straw Weak lingual manipulation;Reduced posterior propulsion;Piecemeal swallowing;Decreased bolus cohesion;Premature spillage Oral - Thin Teaspoon Weak lingual manipulation;Reduced posterior propulsion;Premature spillage Oral - Thin Cup Weak lingual manipulation;Reduced posterior propulsion;Piecemeal swallowing;Decreased bolus cohesion;Premature spillage Oral - Thin Straw Weak lingual manipulation;Reduced posterior propulsion;Piecemeal swallowing;Decreased bolus cohesion;Premature spillage Oral - Puree Weak lingual manipulation;Reduced posterior propulsion Oral - Mech Soft Impaired mastication;Weak lingual manipulation;Reduced posterior propulsion Oral - Regular -- Oral - Multi-Consistency -- Oral - Pill -- Oral Phase - Comment --  CHL IP PHARYNGEAL PHASE 12/14/2019 Pharyngeal Phase Impaired Pharyngeal- Pudding Teaspoon --  Pharyngeal -- Pharyngeal- Pudding Cup -- Pharyngeal -- Pharyngeal- Honey Teaspoon -- Pharyngeal -- Pharyngeal- Honey Cup -- Pharyngeal -- Pharyngeal- Nectar Teaspoon Delayed swallow initiation-pyriform sinuses Pharyngeal Material does not enter airway Pharyngeal- Nectar Cup Reduced epiglottic inversion;Reduced tongue base retraction;Pharyngeal residue - valleculae Pharyngeal Material does not enter airway Pharyngeal- Nectar Straw -- Pharyngeal -- Pharyngeal- Thin Teaspoon Delayed swallow initiation-vallecula;Reduced epiglottic inversion;Reduced laryngeal elevation;Reduced airway/laryngeal closure;Reduced tongue base retraction;Pharyngeal residue - valleculae Pharyngeal -- Pharyngeal- Thin Cup Reduced epiglottic inversion;Reduced laryngeal elevation;Reduced airway/laryngeal closure;Reduced tongue base retraction;Penetration/Aspiration during swallow;Pharyngeal residue - valleculae Pharyngeal Material enters airway, remains ABOVE vocal cords and not ejected out;Material enters airway, passes BELOW cords and not ejected out despite cough attempt by patient Pharyngeal- Thin Straw Delayed swallow initiation-vallecula;Reduced epiglottic inversion;Reduced laryngeal elevation;Pharyngeal residue - valleculae;Compensatory strategies attempted (with notebox) Pharyngeal -- Pharyngeal- Puree Delayed swallow initiation-vallecula;Reduced pharyngeal peristalsis;Reduced tongue base retraction;Pharyngeal residue - valleculae;Lateral channel residue Pharyngeal -- Pharyngeal- Mechanical Soft Delayed swallow initiation-vallecula;Reduced tongue base retraction;Pharyngeal residue - valleculae Pharyngeal -- Pharyngeal- Regular -- Pharyngeal -- Pharyngeal- Multi-consistency -- Pharyngeal -- Pharyngeal- Pill -- Pharyngeal -- Pharyngeal Comment --  CHL IP CERVICAL ESOPHAGEAL PHASE 12/14/2019 Cervical Esophageal Phase Impaired Pudding Teaspoon -- Pudding Cup -- Honey Teaspoon --  Honey Cup -- Nectar Teaspoon -- Nectar Cup -- Nectar Straw -- Thin  Teaspoon -- Thin Cup -- Thin Straw -- Puree -- Mechanical Soft -- Regular -- Multi-consistency -- Pill -- Cervical Esophageal Comment -- Kathleen Lime, MS [facility] SLP Acute Rehab Services Office [phone] [name] 12/14/2019, 3:17 PM  CLINICAL DATA:  Dysphagia. Recent pneumonia despite intake restrictions. EXAM: MODIFIED BARIUM SWALLOW TECHNIQUE: Different consistencies of barium were administered orally to the patient by the Speech Pathologist. Imaging of the pharynx was performed in the lateral projection. The radiologist was present in the fluoroscopy room for this study, providing personal supervision. FLUOROSCOPY TIME:  Fluoroscopy Time:  1 minutes 48 seconds Radiation Exposure Index (if provided by the fluoroscopic device): 3.1 mGy Number of Acquired Spot Images: 0 COMPARISON:  None. FINDINGS: With thin barium, there was aspiration into the cervical trachea with a cough reflex. This improved with the chin tucked and did not occur when only a teaspoon of thin liquid was swallowed at a time. There was also mild vallecular pooling. There was flash laryngeal penetration and mild vallecular pooling with nectar. Vallecular pooling was demonstrated with puree. This did not clear with subsequent swallows of nectar. Vallecular pooling was demonstrated with cracker. IMPRESSION: 1. Aspiration with thin liquid with a cough reflex. This improved with the chin tucked and did not occur when only a teaspoon of thin liquid was swallowed at a time. 2. Flash laryngeal penetration and mild vallecular pooling with nectar. 3. Vallecular pulling with puree and cracker. Please refer to the Speech Pathologists report for complete details and recommendations. Electronically Signed   By: Claudie Revering M.D.   On: 12/14/2019 14:11   DG Chest Portable 1 View  Result Date: 12/26/2019 CLINICAL DATA:  Pneumonia EXAM: PORTABLE CHEST 1 VIEW COMPARISON:  12/29/2019 FINDINGS: Similar hazy airspace opacity in the right lower lung with likely  trace right pleural effusion. No pneumothorax. The left lung is clear. Cardiopericardial silhouette is similar in within normal limits. Calcific atherosclerosis of the aorta. Dual lead cardiac rhythm maintenance device in similar position. Polyarticular degenerative change. IMPRESSION: Similar findings suggestive of right lower lobe pneumonia and likely trace right pleural effusion. Electronically Signed   By: Margaretha Sheffield MD   On: 12/26/2019 13:45   DG Chest Port 1 View  Result Date: 12/21/2019 CLINICAL DATA:  Lethargy, pneumonia diagnosis 1 week ago, productive cough EXAM: PORTABLE CHEST 1 VIEW COMPARISON:  09/10/2019 FINDINGS: Single frontal view of the chest demonstrates right lower lobe consolidation and trace right effusion compatible with known history of pneumonia. No pneumothorax. Left chest is clear. Cardiac silhouette is unremarkable. Dual lead pacemaker is stable. IMPRESSION: 1. Right lower lobe pneumonia with trace right parapneumonic effusion. Electronically Signed   By: Randa Ngo M.D.   On: 12/21/2019 18:00    Assessment/Plan Oropharyngeal dysphagia Discussed in detail with the patient Patient is cognitively intact.  He said that quality of life matters to him and he would rather have him liquids. He continues to be very high risk for aspiration Pneumonia of right middle lobe due to infectious organism Off antibiotics continues to have cough and Rales  Most likely due to chronic aspiration iron deficiency anemia, unspecified iron deficiency anemia type Hemoglobin has dropped further Family is waiting for  him to talk to palliative care again before taking him to GI I also making referral to hematology if family agrees Continue on iron So far has been occult negative PAF (paroxysmal atrial fibrillation) (HCC) Rate seems to  be controlled Off Eliquis due to anemia Essential hypertension Blood pressure controlled Hypothyroidism, unspecified type Continues  Synthroid Weight loss Due to dysphagia Already on Remeron Depression, recurrent (HCC) Continue on Remeron DNR (do not resuscitate) Discussed in detail with the patient and he has now added to be DNR.    Family/ staff Communication:   Labs/tests ordered:  CBC in 1 week  Total time spent in this patient care encounter was 45 _  minutes; greater than 50% of the visit spent counseling patient and staff, reviewing records , Labs and coordinating care for problems addressed at this encounter.

## 2020-01-08 ENCOUNTER — Telehealth: Payer: Self-pay | Admitting: Hematology

## 2020-01-08 NOTE — Telephone Encounter (Signed)
[facility] pt appt for [name] to be seen for anemia. Pt has been scheduled to see Dr. Irene Limbo on 9/15 at [facility] the pt should arrive 30 minutes early.

## 2020-01-10 DIAGNOSIS — R6889 Other general symptoms and signs: Secondary | ICD-10-CM | POA: Diagnosis not present

## 2020-01-10 DIAGNOSIS — R791 Abnormal coagulation profile: Secondary | ICD-10-CM | POA: Diagnosis not present

## 2020-01-10 LAB — CBC: RBC: 2.93 — AB (ref 3.87–5.11)

## 2020-01-10 LAB — CBC AND DIFFERENTIAL
HCT: 26 — AB (ref 41–53)
Hemoglobin: 8.1 — AB (ref 13.5–17.5)
Platelets: 215 (ref 150–399)
WBC: 6.2

## 2020-01-15 ENCOUNTER — Non-Acute Institutional Stay: Payer: Medicare Other | Admitting: Internal Medicine

## 2020-01-15 DIAGNOSIS — I63112 Cerebral infarction due to embolism of left vertebral artery: Secondary | ICD-10-CM

## 2020-01-15 DIAGNOSIS — Z515 Encounter for palliative care: Secondary | ICD-10-CM | POA: Diagnosis not present

## 2020-01-16 ENCOUNTER — Encounter: Payer: Self-pay | Admitting: Internal Medicine

## 2020-01-16 ENCOUNTER — Telehealth: Payer: Self-pay

## 2020-01-16 ENCOUNTER — Telehealth: Payer: Self-pay | Admitting: Emergency Medicine

## 2020-01-16 ENCOUNTER — Inpatient Hospital Stay: Payer: Medicare Other | Attending: Hematology | Admitting: Hematology

## 2020-01-16 ENCOUNTER — Other Ambulatory Visit: Payer: Self-pay

## 2020-01-16 ENCOUNTER — Non-Acute Institutional Stay (SKILLED_NURSING_FACILITY): Payer: Medicare Other | Admitting: Internal Medicine

## 2020-01-16 ENCOUNTER — Inpatient Hospital Stay: Payer: Medicare Other

## 2020-01-16 ENCOUNTER — Ambulatory Visit (INDEPENDENT_AMBULATORY_CARE_PROVIDER_SITE_OTHER): Payer: Medicare Other | Admitting: *Deleted

## 2020-01-16 VITALS — BP 93/67 | HR 76 | Temp 97.2°F | Resp 18 | Ht 69.0 in | Wt 125.8 lb

## 2020-01-16 DIAGNOSIS — Z7901 Long term (current) use of anticoagulants: Secondary | ICD-10-CM

## 2020-01-16 DIAGNOSIS — I480 Paroxysmal atrial fibrillation: Secondary | ICD-10-CM

## 2020-01-16 DIAGNOSIS — I495 Sick sinus syndrome: Secondary | ICD-10-CM

## 2020-01-16 DIAGNOSIS — D509 Iron deficiency anemia, unspecified: Secondary | ICD-10-CM

## 2020-01-16 DIAGNOSIS — D649 Anemia, unspecified: Secondary | ICD-10-CM

## 2020-01-16 DIAGNOSIS — R1312 Dysphagia, oropharyngeal phase: Secondary | ICD-10-CM

## 2020-01-16 DIAGNOSIS — R634 Abnormal weight loss: Secondary | ICD-10-CM

## 2020-01-16 LAB — CMP (CANCER CENTER ONLY)
ALT: 9 U/L (ref 0–44)
AST: 31 U/L (ref 15–41)
Albumin: 2.1 g/dL — ABNORMAL LOW (ref 3.5–5.0)
Alkaline Phosphatase: 85 U/L (ref 38–126)
Anion gap: 8 (ref 5–15)
BUN: 14 mg/dL (ref 8–23)
CO2: 26 mmol/L (ref 22–32)
Calcium: 8.9 mg/dL (ref 8.9–10.3)
Chloride: 100 mmol/L (ref 98–111)
Creatinine: 0.84 mg/dL (ref 0.61–1.24)
GFR, Est AFR Am: >60 mL/min (ref >60)
GFR, Estimated: >60 mL/min (ref >60)
Glucose, Bld: 103 mg/dL — ABNORMAL HIGH (ref 70–99)
Potassium: 4.2 mmol/L (ref 3.5–5.1)
Sodium: 134 mmol/L — ABNORMAL LOW (ref 135–145)
Total Bilirubin: 0.5 mg/dL (ref 0.3–1.2)
Total Protein: 7.3 g/dL (ref 6.5–8.1)

## 2020-01-16 LAB — CUP PACEART REMOTE DEVICE CHECK
Battery Remaining Longevity: 61 mo
Battery Voltage: 3.01 V
Brady Statistic AP VP Percent: 0.01 %
Brady Statistic AP VS Percent: 14.83 %
Brady Statistic AS VP Percent: 0.03 %
Brady Statistic AS VS Percent: 85.13 %
Brady Statistic RA Percent Paced: 13.36 %
Brady Statistic RV Percent Paced: 0.03 %
Date Time Interrogation Session: 20210914163745
Implantable Lead Implant Date: 20170710
Implantable Lead Implant Date: 20170710
Implantable Lead Location: 753859
Implantable Lead Location: 753860
Implantable Lead Model: 3830
Implantable Lead Model: 5076
Implantable Pulse Generator Implant Date: 20170710
Lead Channel Impedance Value: 266 Ohm
Lead Channel Impedance Value: 323 Ohm
Lead Channel Impedance Value: 380 Ohm
Lead Channel Impedance Value: 399 Ohm
Lead Channel Pacing Threshold Amplitude: 0.5 V
Lead Channel Pacing Threshold Amplitude: 0.75 V
Lead Channel Pacing Threshold Pulse Width: 0.4 ms
Lead Channel Pacing Threshold Pulse Width: 0.4 ms
Lead Channel Sensing Intrinsic Amplitude: 2.75 mV
Lead Channel Sensing Intrinsic Amplitude: 2.75 mV
Lead Channel Sensing Intrinsic Amplitude: 9 mV
Lead Channel Sensing Intrinsic Amplitude: 9 mV
Lead Channel Setting Pacing Amplitude: 1.75 V
Lead Channel Setting Pacing Amplitude: 3.5 V
Lead Channel Setting Pacing Pulse Width: 1 ms
Lead Channel Setting Sensing Sensitivity: 0.9 mV

## 2020-01-16 LAB — TSH: TSH: 3.987 u[IU]/mL (ref 0.320–4.118)

## 2020-01-16 LAB — IRON AND TIBC
Iron: 15 ug/dL — ABNORMAL LOW (ref 42–163)
Saturation Ratios: 10 % — ABNORMAL LOW (ref 20–55)
TIBC: 149 ug/dL — ABNORMAL LOW (ref 202–409)
UIBC: 134 ug/dL (ref 117–376)

## 2020-01-16 LAB — FERRITIN: Ferritin: 923 ng/mL — ABNORMAL HIGH (ref 24–336)

## 2020-01-16 LAB — CBC WITH DIFFERENTIAL/PLATELET
Abs Immature Granulocytes: 0.02 10*3/uL (ref 0.00–0.07)
Basophils Absolute: 0 10*3/uL (ref 0.0–0.1)
Basophils Relative: 0 %
Eosinophils Absolute: 0 10*3/uL (ref 0.0–0.5)
Eosinophils Relative: 0 %
HCT: 28.4 % — ABNORMAL LOW (ref 39.0–52.0)
Hemoglobin: 8.6 g/dL — ABNORMAL LOW (ref 13.0–17.0)
Immature Granulocytes: 0 %
Lymphocytes Relative: 10 %
Lymphs Abs: 0.6 10*3/uL — ABNORMAL LOW (ref 0.7–4.0)
MCH: 26.5 pg (ref 26.0–34.0)
MCHC: 30.3 g/dL (ref 30.0–36.0)
MCV: 87.7 fL (ref 80.0–100.0)
Monocytes Absolute: 0.5 10*3/uL (ref 0.1–1.0)
Monocytes Relative: 7 %
Neutro Abs: 5.5 10*3/uL (ref 1.7–7.7)
Neutrophils Relative %: 83 %
Platelets: 259 10*3/uL (ref 150–400)
RBC: 3.24 MIL/uL — ABNORMAL LOW (ref 4.22–5.81)
RDW: 17.8 % — ABNORMAL HIGH (ref 11.5–15.5)
WBC: 6.7 10*3/uL (ref 4.0–10.5)
nRBC: 0 % (ref 0.0–0.2)

## 2020-01-16 LAB — RETICULOCYTES
Immature Retic Fract: 16.6 % — ABNORMAL HIGH (ref 2.3–15.9)
RBC.: 3.24 MIL/uL — ABNORMAL LOW (ref 4.22–5.81)
Retic Count, Absolute: 51.5 10*3/uL (ref 19.0–186.0)
Retic Ct Pct: 1.6 % (ref 0.4–3.1)

## 2020-01-16 LAB — LACTATE DEHYDROGENASE: LDH: 178 U/L (ref 98–192)

## 2020-01-16 NOTE — Progress Notes (Signed)
Location:    [facility] Room Number: 39 Place of Service:  SNF [phone]) Provider:  Veleta Miners MD  [name], MD  Patient Care Team: [name], MD as PCP - General (Internal Medicine) [name], MD (Ophthalmology) [name], MD (Orthopedic Surgery) [name], MD (Cardiology) [name], MD (General Surgery) [name], MD [facility])  Extended Emergency Contact Information Primary Emergency Contact: Underwood,[name] Address: [place], [place] [address] [name] Phone: [phone] Mobile Phone: [phone] Relation: Spouse Secondary Emergency Contact: Jaquell, Seddon Mobile Phone: [phone] Relation: Son  Code Status:  DNR Goals of care: Advanced Directive information Advanced Directives 01/03/2020  Does Patient Have a Medical Advance Directive? Yes  Type of Advance Directive Living will  Does patient want to make changes to medical advance directive? No - Patient declined  Copy of [place] in Chart? Yes - validated most recent copy scanned in chart (See row information)  Would patient like information on creating a medical advance directive? -     Chief Complaint  Patient presents with  . Acute Visit    Rapid Atrial Fibrilation    HPI:  Pt is a 86 y.o. male seen today for an acute visit for Atrial Fibrillation with Rapid Ventricular rate per Cardiology yesterday through the Transmitter  Patient has history ofAcute Right MCA infarct with left Sided weakness from 5/9-5/14.MRI scan showed right subcortical infarct most likely embolic etiology Patient has past medical history of hypertension, hypothyroidism, chronic diastolic CHF, permanent A. fib , traumatic ICH in January 2021 and Collesfracture of right radius, PPM implant.,History of PMR, hyperlipidemia Also Significant Weight loss of more then 20 lbs Recurent Pneumonia  Patient  continues to have issues with dysphagia.  Continues to have coughing thick mucus.  Had one episode of vomiting this morning as tablets got stuck in his throat. Cardiology nurse called today at patient is having longer episodes of A. fib with high ventricular rate.  Patient denies any chest pain.  He denies any palpitations.  Unfortunately he has been off his Eliquis due to severe iron deficiency anemia. Patient is planning to see the hematology today.  The family had decided to transition him to hospice eventually.   Past Medical History:  Diagnosis Date  . AF (atrial fibrillation) [facility])    a. s/p RFCA/PVI @ [facility] clinic 2007.  . Arthritis    "joints" (06/21/2013)  . Atrial flutter ([place])    a. 06/2013 s/p TEE/DCCV  . Chronic systolic CHF (congestive heart failure) ([place])    a. 06/2013 Echo: EF 20%, diff HK, mild LVH.  Marland Kitchen History of cardiovascular stress test    Lexiscan Myoview 8/16:  EF 59%, attenuation artifact, no ischemia; Low Risk  . Hyperlipidemia   . Hypertension   . HYPERTENSION, BENIGN 08/21/2009   Qualifier: Diagnosis of  By: Caryl Comes, MD, Remus Blake  Medications(s)  enalapril   . Hypothyroidism   . Neoplasm of uncertain behavior    SKIN  . Polymyalgia rheumatica ([place]) 01/13/2007   Qualifier: History of  By: Danny Lawless CMA, [place]     . Presence of permanent cardiac pacemaker    Past Surgical History:  Procedure Laterality Date  . ATRIAL FIBRILLATION ABLATION  2007  . CARDIOVERSION N/A 06/22/2013   Procedure: CARDIOVERSION;  Surgeon: Sueanne Margarita, MD;  Location: [facility];  Service: Cardiovascular;  Laterality: N/A;  15:17 Lido 20mg ,IV , Propofol 30 mg, IV synched cardioversion @ 150 joules by Dr. Lajoyce Lauber from a flutter to sinis brady which is baseline for this pt, reports 45-50 reg HR verified by Dr. Ashok Norris  . CARDIOVERSION N/A 11/21/2014   Procedure: CARDIOVERSION;  Surgeon: Fay Records, MD;  Location: [facility] ENDOSCOPY;  Service: Cardiovascular;  Laterality: N/A;  .  CARDIOVERSION N/A 07/14/2015   Procedure: CARDIOVERSION;  Surgeon: Sueanne Margarita, MD;  Location: [facility];  Service: Cardiovascular;  Laterality: N/A;  . CATARACT EXTRACTION, BILATERAL Bilateral Summer 2009  . CHOLECYSTECTOMY    . EP IMPLANTABLE DEVICE N/A 11/10/2015   Procedure: Pacemaker Implant;  Surgeon: [name], MD;  Location: [facility] CV LAB;  Service: Cardiovascular;  Laterality: N/A;  . HAND SURGERY Right    "broke bone; grafted area w/bone from somewhere else"  . HEMORRHOID SURGERY    . INGUINAL HERNIA REPAIR Bilateral   . TEE WITHOUT CARDIOVERSION N/A 06/22/2013   Procedure: TRANSESOPHAGEAL ECHOCARDIOGRAM (TEE);  Surgeon: Sueanne Margarita, MD;  Location: [facility] ENDOSCOPY;  Service: Cardiovascular;  Laterality: N/A;  . TEE WITHOUT CARDIOVERSION N/A 07/14/2015   Procedure: TRANSESOPHAGEAL ECHOCARDIOGRAM (TEE);  Surgeon: Sueanne Margarita, MD;  Location: [facility] ENDOSCOPY;  Service: Cardiovascular;  Laterality: N/A;  . TONSILLECTOMY      No Known Allergies  Allergies as of 01/16/2020   No Known Allergies     Medication List       Accurate as of January 16, 2020 11:23 AM. If you have any questions, ask your nurse or doctor.        antiseptic oral rinse Liqd 20 mLs by Mouth Rinse route as needed for dry mouth.   atorvastatin 40 MG tablet Commonly known as: LIPITOR Take 40 mg by mouth daily.   carboxymethylcellulose 0.5 % Soln Commonly known as: REFRESH PLUS 1 drop every 4 (four) hours as needed. Each eye   docusate sodium 100 MG capsule Commonly known as: COLACE Take 200 mg by mouth daily.   ferrous sulfate 325 (65 FE) MG EC tablet Take 325 mg by mouth daily.   glucosamine-chondroitin 500-400 MG tablet Take 1 tablet by mouth 3 (three) times daily.   levothyroxine 50 MCG tablet Commonly known as: SYNTHROID Take 1 tablet (50 mcg total) by mouth daily before breakfast.   mirtazapine 15 MG tablet Commonly known as: REMERON Take 15 mg by mouth at bedtime.     multivitamin with minerals Tabs tablet Take 1 tablet by mouth daily. Centrum   polyethylene glycol 17 g packet Commonly known as: MIRALAX / GLYCOLAX Take 17 g by mouth daily. 17 gram/dose, oral, Once A Morning, Miralax 17 gm PO QD. Hold for diarrhea.   RESOURCE 2.0 PO Take by mouth in the morning, at noon, and at bedtime.   sennosides-docusate sodium 8.6-50 MG tablet Commonly known as: SENOKOT-S Take 2 tablets by mouth daily.       Review of Systems  Constitutional: Positive for activity change, appetite change and unexpected weight change.  HENT: Positive for congestion.   Respiratory: Positive for cough and choking.   Cardiovascular: Negative.   Gastrointestinal: Negative.   Genitourinary: Negative.   Musculoskeletal: Positive for gait problem.  Skin: Negative.   Neurological: Positive for weakness.  Psychiatric/Behavioral: Negative.     Immunization History  Administered Date(s) Administered  . Influenza Split 01/25/2012  . Influenza Whole 02/08/2008, 03/12/2009, 01/16/2010  . Influenza, High Dose Seasonal PF 01/24/2015, 03/04/2017, 02/16/2018, 12/29/2018  . Influenza,inj,Quad PF,6+ Mos 01/25/2013,  01/23/2014  . Influenza-Unspecified 01/02/2016  . PFIZER SARS-COV-2 Vaccination 05/22/2019, 06/12/2019  . Pneumococcal Conjugate-13 01/25/2013  . Pneumococcal Polysaccharide-23 05/28/2016  . Td 01/16/2010  . Tdap 05/06/2019   Pertinent  Health Maintenance Due  Topic Date Due  . INFLUENZA VACCINE  12/02/2019  . PNA vac Low Risk Adult  Completed   Fall Risk  03/23/2019 03/17/2018 02/16/2018 05/28/2016 04/08/2016  Falls in the past year? 0 1 No No No  Comment [facility] Telephone Survey: data to providers prior to load [employer] Telephone Survey: data to providers prior to load - - [facility] Telephone Survey: data to providers prior to load  Number falls in past yr: - 1 - - -  Comment - [facility] Telephone Survey Actual Response = 2 - - -  Injury with Fall? - 1 - - -   Functional Status  Survey:    Vitals:   01/16/20 1121  BP: 129/84  Pulse: 98  Resp: 18  Temp: (!) 96.7 F (35.9 C)  SpO2: 97%  Weight: 129 lb 3.2 oz (58.6 kg)  Height: 5\' 9"  (1.753 m)   Body mass index is 19.08 kg/m. Physical Exam Vitals reviewed.  Constitutional:      Appearance: Normal appearance.  HENT:     Head: Normocephalic.     Nose: Nose normal.     Mouth/Throat:     Mouth: Mucous membranes are moist.     Pharynx: Oropharynx is clear.  Eyes:     Pupils: Pupils are equal, round, and reactive to light.  Cardiovascular:     Rate and Rhythm: Normal rate. Rhythm irregular.     Pulses: Normal pulses.  Pulmonary:     Effort: Pulmonary effort is normal.     Breath sounds: Rales present.  Abdominal:     General: Abdomen is flat. Bowel sounds are normal.     Palpations: Abdomen is soft.  Musculoskeletal:        General: No swelling.     Cervical back: Neck supple.  Skin:    General: Skin is warm.  Neurological:     Mental Status: He is alert and oriented to person, place, and time.  Psychiatric:        Mood and Affect: Mood normal.        Thought Content: Thought content normal.     Labs reviewed: Recent Labs    09/09/19 1138 09/10/19 0621 09/14/19 0301 09/27/19 0000 12/21/19 1741 12/25/19 2346 12/26/19 1318  NA 141   < > 141   < > 138 139 140  K 4.5   < > 4.2   < > 4.0 3.7 3.7  CL 104   < > 107   < > 102 103 101  CO2 28   < > 25   < > 27 28* 30  GLUCOSE 104*   < > 118*  --  104*  --  165*  BUN 16   < > 33*   < > 17 18 20   CREATININE 0.98   < > 1.14   < > 0.68 0.6 0.86  CALCIUM 9.3   < > 8.9   < > 8.5* 8.2* 8.6*  MG 2.0  --   --   --   --   --   --    < > = values in this interval not displayed.   Recent Labs    09/09/19 1138 09/27/19 0000 12/21/19 1741 12/25/19 2346 12/26/19 1318  AST 21   < > 31 23  31  ALT 10   < > 13 9* 13  ALKPHOS 72   < > 75 75 76  BILITOT 1.0  --  0.7  --  0.7  PROT 6.6  --  7.3  --  6.8  ALBUMIN 3.8   < > 2.5* 2.4* 2.3*   < > =  values in this interval not displayed.   Recent Labs    09/14/19 0301 09/27/19 0000 12/20/19 0011 12/21/19 1741 12/21/19 1741 12/25/19 2346 12/26/19 1318 01/03/20 0000 01/10/20 0000  WBC 5.4   < >   < > 7.3   < > 6.9 7.0 5.9 6.2  NEUTROABS  --    < >  --  6.2  --  5,886 6.0  --   --   HGB 12.9*   < >   < > 8.4*   < > 7.4* 8.5* 7.8* 8.1*  HCT 39.3   < >   < > 27.5*   < > 23* 28.0* 25* 26*  MCV 94.0  --   --  93.2  --   --  92.1  --   --   PLT 133*   < >   < > 218   < > 242 259 276 215   < > = values in this interval not displayed.   Lab Results  Component Value Date   TSH 2.50 12/20/2019   Lab Results  Component Value Date   HGBA1C 5.5 09/10/2019   Lab Results  Component Value Date   CHOL 133 09/10/2019   HDL 41 09/10/2019   LDLCALC 77 09/10/2019   TRIG 73 09/10/2019   CHOLHDL 3.2 09/10/2019    Significant Diagnostic Results in last 30 days:  DG Chest Portable 1 View  Result Date: 12/26/2019 CLINICAL DATA:  Pneumonia EXAM: PORTABLE CHEST 1 VIEW COMPARISON:  12/29/2019 FINDINGS: Similar hazy airspace opacity in the right lower lung with likely trace right pleural effusion. No pneumothorax. The left lung is clear. Cardiopericardial silhouette is similar in within normal limits. Calcific atherosclerosis of the aorta. Dual lead cardiac rhythm maintenance device in similar position. Polyarticular degenerative change. IMPRESSION: Similar findings suggestive of right lower lobe pneumonia and likely trace right pleural effusion. Electronically Signed   By: Margaretha Sheffield MD   On: 12/26/2019 13:45   DG Chest Port 1 View  Result Date: 12/21/2019 CLINICAL DATA:  Lethargy, pneumonia diagnosis 1 week ago, productive cough EXAM: PORTABLE CHEST 1 VIEW COMPARISON:  09/10/2019 FINDINGS: Single frontal view of the chest demonstrates right lower lobe consolidation and trace right effusion compatible with known history of pneumonia. No pneumothorax. Left chest is clear. Cardiac silhouette  is unremarkable. Dual lead pacemaker is stable. IMPRESSION: 1. Right lower lobe pneumonia with trace right parapneumonic effusion. Electronically Signed   By: Randa Ngo M.D.   On: 12/21/2019 18:00   CUP PACEART REMOTE DEVICE CHECK  Result Date: 01/16/2020 Scheduled remote reviewed. Normal device function.  Presenting: AF w/ VR 150's, AF burden 74%. No OAC noted 130 VT, 397 Fast AV, AF w/ RVR Routing to Triage Next remote 91 days. JM   Assessment/Plan PAF (paroxysmal atrial fibrillation) (HCC) Having Prolong A Fib with Rapid Ventricular rate Will restart his Toprol XL at 12.5 mg QD If Continues with prolong episodes Cardiology will let us know Off his Eliquis due  to iron def Anemia Has been occult negative  Oropharyngeal dysphagia Continues to have cough Productive Has refused Modifed diet Also Has Episodes of Vomiting when  taking Meds Family Considering Hospice  Iron deficiency anemia,  On Iron Has hematology Visit today Occult negative   Weight loss Continues to be the issue due to his Dysphagia On Remeron S/P CVA with Left Hemiparesis and Dysphagia Thought to be due to his A Fib Off Eliquis due to his Anemia   Family/ staff Communication:   Labs/tests ordered:

## 2020-01-16 NOTE — Telephone Encounter (Signed)
Dr. Lyndel Safe is calling returning [place], rn phone call.

## 2020-01-16 NOTE — Telephone Encounter (Signed)
Patient nurse from [facility] called and asked for help sending a transmission for patient and I walked her through on how to send it and she will send it once the patient gets home from doctors appt later on this afternoon

## 2020-01-16 NOTE — Progress Notes (Signed)
[facility] Consult Note Telephone: [phone]  Fax: [phone]  PATIENT NAME: [name] DOB: 03/29/1926 MRN: [id number]  PRIMARY CARE PROVIDER:   [name], MD  REFERRING PROVIDER:  [name], MD [place],  [place] [id number]  RESPONSIBLE PARTY:   [place], [place] [phone]  [phone]    [place], [place]   [phone]     RECOMMENDATIONS and PLAN:  Palliative care encounter  Z51.5  1.  Advance care planning:  Followup telephone discussion with son(BO)/POA who reports he met with his parents and the MOST form was reviewed.  Delegations made: continue DNAR status, comfort care,  Determine use of antibiotics, IV fluids for a trial period and no tube feedings.  MOST form was completed and placed in patient's chart.  Goal of care is for comfort after evaluation by hematologist on 01/16/20. Clinical staff was made aware of status change.   2.  Shortness of breath:  Continues to decline with ongoing dysphagia. Increased pulmonary congestion with brief periods of apnea.  No desire for escalation of therapies per son in light of recurrent pneumonias and aspiration. Goal is for comfort.  Supportive care as needed.    3.  Weakness:  Continued progression post CVA with left hemiparesis and pneumonias.  No expectation of improvement. Transition to hospice care for comfort support and this was agreed upon by son/POA.  Terminal diagnosis was conferred with Drs. Monica [name] and [employer].  Verbal Hospice order was received from Dr. Lyndel Safe and will be communicated with [facility] referral center.  Instructions are to call son for any scheduling needs.    4. Anemia:  Plans are to keep pending appt with Dr. Irene Limbo on 01/16/20 for potential cause of anemia.  Family plans to pursue comfort care regardless of outcome.   I spent 90 minutes providing this consultation,  from 1530 to 1700. More than 50% of the  time in this consultation was spent coordinating communication with patient, son BO, Dr. Lyndel Safe and clinical staff.   HISTORY OF PRESENT ILLNESS: Follow-up with [name]  a 86 y.o. year old male. Clinical staff and son report that patient continues to weaken, is intermittently confused and cough is becoming more congested with difficulty in clearing secretions.  He is on a regular diet with thin liquids per his request.  He still requires total assistance with ADLs and has incontinence of urine.  Palliative Care was asked to help address goals of care.   CODE STATUS: DNAR/DNI  PPS: 30% transfers with walker and assistance.  Previously 60% prior to CVA  HOSPICE ELIGIBILITY/DIAGNOSIS: YES/ cerebrovascular disease, recurrent aspiration pneumonias  PAST MEDICAL HISTORY:  Past Medical History:  Diagnosis Date  . AF (atrial fibrillation) [facility])    a. s/p RFCA/PVI @ [facility] clinic 2007.  . Arthritis    "joints" (06/21/2013)  . Atrial flutter ([place])    a. 06/2013 s/p TEE/DCCV  . Chronic systolic CHF (congestive heart failure) ([place])    a. 06/2013 Echo: EF 20%, diff HK, mild LVH.  Marland Kitchen History of cardiovascular stress test    Lexiscan Myoview 8/16:  EF 59%, attenuation artifact, no ischemia; Low Risk  . Hyperlipidemia   . Hypertension   . HYPERTENSION, BENIGN 08/21/2009   Qualifier: Diagnosis of  By: Caryl Comes, MD, Remus Blake  Medications(s)  enalapril   . Hypothyroidism   . Neoplasm of uncertain behavior    SKIN  . Polymyalgia rheumatica (  [place]) 01/13/2007   Qualifier: History of  By: Danny Lawless CMA, [place]     . Presence of permanent cardiac pacemaker     ALLERGIES: No Known Allergies    PERTINENT MEDICATIONS:  Outpatient Encounter Medications as of 01/15/2020  Medication Sig  . antiseptic oral rinse (BIOTENE) LIQD 20 mLs by Mouth Rinse route as needed for dry mouth.   Marland Kitchen atorvastatin (LIPITOR) 40 MG tablet Take 40 mg by mouth daily.  . carboxymethylcellulose (REFRESH PLUS) 0.5 % SOLN 1  drop every 4 (four) hours as needed. Each eye  . docusate sodium (COLACE) 100 MG capsule Take 200 mg by mouth daily.  . ferrous sulfate 325 (65 FE) MG EC tablet Take 325 mg by mouth daily.  Marland Kitchen glucosamine-chondroitin 500-400 MG tablet Take 1 tablet by mouth 3 (three) times daily.  Marland Kitchen levothyroxine (SYNTHROID) 50 MCG tablet Take 1 tablet (50 mcg total) by mouth daily before breakfast.  . mirtazapine (REMERON) 15 MG tablet Take 15 mg by mouth at bedtime.   . Multiple Vitamin (MULTIVITAMIN WITH MINERALS) TABS tablet Take 1 tablet by mouth daily. Centrum  . Nutritional Supplements (RESOURCE 2.0 PO) Take by mouth in the morning, at noon, and at bedtime.  . polyethylene glycol (MIRALAX / GLYCOLAX) 17 g packet Take 17 g by mouth daily. 17 gram/dose, oral, Once A Morning, Miralax 17 gm PO QD. Hold for diarrhea.  . sennosides-docusate sodium (SENOKOT-S) 8.6-50 MG tablet Take 2 tablets by mouth daily.   No facility-administered encounter medications on file as of 01/15/2020.    PHYSICAL EXAM:   General: Very poor health, frail and fragile appearing, thin Cardiovascular: regular rhythm at 122/min  Pulmonary: Coarse rhonchi of BUL, diminished breath sounds of bases.  Moist cough. Intermittent periods of apnea  Sats 91% on rm air Abdomen: soft, nontender, + bowel sounds Extremities:Edema of LUE Skin: pale and cool to touch Neurological: somnolent but arouses easily, weakness and > on LUE and LLE. Confused to recent occurences  Gonzella Lex, NP-C

## 2020-01-16 NOTE — Telephone Encounter (Addendum)
Dr Lyndel Safe informed that patient has been in intermittaent AF with RVR  Since 01/14/20 with the longest episode lasting over 4 hours . She reports she will be restarting Toprol XL 12.5 mg qd. Asked that facility send  remote transmission to determine current status.

## 2020-01-16 NOTE — Progress Notes (Signed)
HEMATOLOGY/ONCOLOGY CONSULTATION NOTE  Date of Service: 01/16/2020  Patient Care Team: [name], MD as PCP - General (Internal Medicine) [name], MD (Ophthalmology) [name], MD (Orthopedic Surgery) [name], MD (Cardiology) [name], MD (General Surgery) [name], MD (Family Medicine)  CHIEF COMPLAINTS/PURPOSE OF CONSULTATION:  Anemia  HISTORY OF PRESENTING ILLNESS:   [name] is a wonderful 86 y.o. male who has been referred to [place] by Dr. Lyndel Safe for evaluation and management of anemia. The pt reports that he is doing well overall.   The pt reports that he had no history of anemia prior to his stroke in May and pneumonia in July. He continues using Eliquis for his PAF. Pt denies any bloody or black stools or other abnormal bleeding. He has lost 25 lbs over the last 6 months. Pt was on a liquid diet until two weeks ago, since then he has started gaining weight. Pt denies any chronic oxygen use. He reports worsening fatigue and weakness in the last few months. Pt vomited his breakfast and all but four of his medications this morning.   Most recent lab results (01/10/2020) of CBC is as follows: all values are WNL except for RBC at 2.87, Hgb at 7.8, HCT at 25.4, MCHC at 30.7. 12/26/2019 Folate at 25.4 12/26/2019 Ferritin at 902 12/26/2019 Vitamin B12 at 585 12/26/2019 Iron Panel is as follows: Iron at 17, TIBC at 137, Sat Ratios at 12, UIBC at 120 12/26/2019 Retic Ct Pct at 1.5, Retic Ct Abs at 45.8, Immature Retic Fract at 19.4  On review of systems, pt reports vomiting, coughing, fatigue, weakness and denies bloody/black stools, nose bleed, gum bleed, hematuria, fevers, chills, night sweats, bone pain, abdominal pain, new lumps/bumps, leg swelling, chest pain, SOB, dizziness and any other symptoms.   On PMHx the pt reports PAF, Pneumonia, CVA, Iron deficiency anemia. On Social Hx the pt reports that he lives at [facility] in the  skilled nursing facility. His wife also lives at [facility] in the retirement community.    MEDICAL HISTORY:  Past Medical History:  Diagnosis Date  . AF (atrial fibrillation) [facility])    a. s/p RFCA/PVI @ [facility] clinic 2007.  . Arthritis    "joints" (06/21/2013)  . Atrial flutter ([place])    a. 06/2013 s/p TEE/DCCV  . Chronic systolic CHF (congestive heart failure) ([place])    a. 06/2013 Echo: EF 20%, diff HK, mild LVH.  Marland Kitchen History of cardiovascular stress test    Lexiscan Myoview 8/16:  EF 59%, attenuation artifact, no ischemia; Low Risk  . Hyperlipidemia   . Hypertension   . HYPERTENSION, BENIGN 08/21/2009   Qualifier: Diagnosis of  By: Caryl Comes, MD, Remus Blake  Medications(s)  enalapril   . Hypothyroidism   . Neoplasm of uncertain behavior    SKIN  . Polymyalgia rheumatica ([place]) 01/13/2007   Qualifier: History of  By: Danny Lawless CMA, [place]     . Presence of permanent cardiac pacemaker     SURGICAL HISTORY: Past Surgical History:  Procedure Laterality Date  . ATRIAL FIBRILLATION ABLATION  2007  . CARDIOVERSION N/A 06/22/2013   Procedure: CARDIOVERSION;  Surgeon: Sueanne Margarita, MD;  Location: [facility] ENDOSCOPY;  Service: Cardiovascular;  Laterality: N/A;  15:17 Lido 67m,IV , Propofol 30 mg, IV synched cardioversion @ 150 joules by Dr. TLajoyce Lauberfrom a flutter to sinis brady which is baseline for this pt, reports 45-50 reg HR verified by Dr. TAshok Norris .  CARDIOVERSION N/A 11/21/2014   Procedure: CARDIOVERSION;  Surgeon: Fay Records, MD;  Location: [facility] ENDOSCOPY;  Service: Cardiovascular;  Laterality: N/A;  . CARDIOVERSION N/A 07/14/2015   Procedure: CARDIOVERSION;  Surgeon: Sueanne Margarita, MD;  Location: [place];  Service: Cardiovascular;  Laterality: N/A;  . CATARACT EXTRACTION, BILATERAL Bilateral Summer 2009  . CHOLECYSTECTOMY    . EP IMPLANTABLE DEVICE N/A 11/10/2015   Procedure: Pacemaker Implant;  Surgeon: [name], MD;  Location: [place] CV LAB;  Service:  Cardiovascular;  Laterality: N/A;  . HAND SURGERY Right    "broke bone; grafted area w/bone from somewhere else"  . HEMORRHOID SURGERY    . INGUINAL HERNIA REPAIR Bilateral   . TEE WITHOUT CARDIOVERSION N/A 06/22/2013   Procedure: TRANSESOPHAGEAL ECHOCARDIOGRAM (TEE);  Surgeon: Sueanne Margarita, MD;  Location: [facility] ENDOSCOPY;  Service: Cardiovascular;  Laterality: N/A;  . TEE WITHOUT CARDIOVERSION N/A 07/14/2015   Procedure: TRANSESOPHAGEAL ECHOCARDIOGRAM (TEE);  Surgeon: Sueanne Margarita, MD;  Location: [facility] ENDOSCOPY;  Service: Cardiovascular;  Laterality: N/A;  . TONSILLECTOMY      SOCIAL HISTORY: Social History   Socioeconomic History  . Marital status: Married    Spouse name: [name]  . Number of children: 3  . Years of education: 42  . Highest education level: Not on file  Occupational History  . Occupation: RETIRED Administrator, sports: RETIRED    Comment: Still active w/stocks  . Occupation: RETIRED    Employer: RETIRED  Tobacco Use  . Smoking status: Former Smoker    Packs/day: 1.00    Years: 12.00    Pack years: 12.00    Types: Cigarettes    Quit date: 05/04/1963    Years since quitting: 56.7  . Smokeless tobacco: Never Used  . Tobacco comment: quit cigars 73'   Substance and Sexual Activity  . Alcohol use: Yes    Comment: 06/21/2013 "I was a moderate drinker at most; rarely have drink now"  . Drug use: No  . Sexual activity: Never    Partners: Female  Other Topics Concern  . Not on file  Social History Narrative   ** Merged History Encounter **       [employer], BA,  MBA, Chiropodist. Married 1958. 2 -girls '59, '62; 1 son '64; 2 grandchildren. Retired Customer service manager. END of LIFE CARE: Yes - DNR, yes - for short term mechanical ventilation.has a living will, would not want any heroic/futile care.  Wife sustained fracture proximal humerus left - Sept '11   Social Determinants of Health   Financial Resource Strain:   . Difficulty of Paying Living  Expenses: Not on file  Food Insecurity:   . Worried About Charity fundraiser in the Last Year: Not on file  . Ran Out of Food in the Last Year: Not on file  Transportation Needs:   . Lack of Transportation (Medical): Not on file  . Lack of Transportation (Non-Medical): Not on file  Physical Activity:   . Days of Exercise per Week: Not on file  . Minutes of Exercise per Session: Not on file  Stress:   . Feeling of Stress : Not on file  Social Connections:   . Frequency of Communication with Friends and Family: Not on file  . Frequency of Social Gatherings with Friends and Family: Not on file  . Attends Religious Services: Not on file  . Active Member of Clubs or Organizations: Not on file  . Attends Club  or Organization Meetings: Not on file  . Marital Status: Not on file  Intimate Partner Violence:   . Fear of Current or Ex-Partner: Not on file  . Emotionally Abused: Not on file  . Physically Abused: Not on file  . Sexually Abused: Not on file    FAMILY HISTORY: Family History  Problem Relation Age of Onset  . Emphysema Mother   . Diabetes Mother   . Kidney disease Father     ALLERGIES:  has No Known Allergies.  MEDICATIONS:  Current Outpatient Medications  Medication Sig Dispense Refill  . antiseptic oral rinse (BIOTENE) LIQD 20 mLs by Mouth Rinse route as needed for dry mouth.     Marland Kitchen atorvastatin (LIPITOR) 40 MG tablet Take 40 mg by mouth daily.    . carboxymethylcellulose (REFRESH PLUS) 0.5 % SOLN 1 drop every 4 (four) hours as needed. Each eye    . docusate sodium (COLACE) 100 MG capsule Take 200 mg by mouth daily.    . ferrous sulfate 325 (65 FE) MG EC tablet Take 325 mg by mouth daily.    Marland Kitchen glucosamine-chondroitin 500-400 MG tablet Take 1 tablet by mouth 3 (three) times daily.    Marland Kitchen levothyroxine (SYNTHROID) 50 MCG tablet Take 1 tablet (50 mcg total) by mouth daily before breakfast. 90 tablet 1  . mirtazapine (REMERON) 15 MG tablet Take 15 mg by mouth at bedtime.      . Multiple Vitamin (MULTIVITAMIN WITH MINERALS) TABS tablet Take 1 tablet by mouth daily. Centrum    . Nutritional Supplements (RESOURCE 2.0 PO) Take by mouth in the morning, at noon, and at bedtime.    . polyethylene glycol (MIRALAX / GLYCOLAX) 17 g packet Take 17 g by mouth daily. 17 gram/dose, oral, Once A Morning, Miralax 17 gm PO QD. Hold for diarrhea.    . sennosides-docusate sodium (SENOKOT-S) 8.6-50 MG tablet Take 2 tablets by mouth daily.     No current facility-administered medications for this visit.    REVIEW OF SYSTEMS:    10 Point review of Systems was done is negative except as noted above.  PHYSICAL EXAMINATION: ECOG PERFORMANCE STATUS: 2 - Symptomatic, <50% confined to bed  . Vitals:   01/16/20 1318  BP: 93/67  Pulse: 76  Resp: 18  Temp: (!) 97.2 F (36.2 C)  SpO2: (!) 84%   Filed Weights   01/16/20 1318  Weight: 125 lb 12.8 oz (57.1 kg)   .Body mass index is 18.58 kg/m.  Exam was given in a wheelchair   GENERAL:alert, in no acute distress and comfortable SKIN: no acute rashes, no significant lesions EYES: conjunctiva are pink and non-injected, sclera anicteric OROPHARYNX: MMM, no exudates, no oropharyngeal erythema or ulceration NECK: supple, no JVD LYMPH:  no palpable lymphadenopathy in the cervical, axillary or inguinal regions LUNGS: clear to auscultation b/l with normal respiratory effort HEART: regular rate & rhythm ABDOMEN:  normoactive bowel sounds , non tender, not distended. Extremity: no pedal edema PSYCH: alert & oriented x 3 with fluent speech NEURO: no focal motor/sensory deficits  LABORATORY DATA:  I have reviewed the data as listed  . CBC Latest Ref Rng & Units 01/16/2020 01/10/2020 01/03/2020  WBC 4.0 - 10.5 K/uL 6.7 6.2 5.9  Hemoglobin 13.0 - 17.0 g/dL 8.6(L) 8.1(A) 7.8(A)  Hematocrit 39 - 52 % 28.4(L) 26(A) 25(A)  Platelets 150 - 400 K/uL 259 215 276   . CBC    Component Value Date/Time   WBC 6.7 01/16/2020 1357   RBC  3.24  (L) 01/16/2020 1357   RBC 3.24 (L) 01/16/2020 1357   HGB 8.6 (L) 01/16/2020 1357   HGB 13.0 09/15/2017 1420   HCT 28.4 (L) 01/16/2020 1357   HCT 40.5 09/15/2017 1420   PLT 259 01/16/2020 1357   PLT 178 09/15/2017 1420   MCV 87.7 01/16/2020 1357   MCV 92 09/15/2017 1420   MCH 26.5 01/16/2020 1357   MCHC 30.3 01/16/2020 1357   RDW 17.8 (H) 01/16/2020 1357   RDW 13.8 09/15/2017 1420   LYMPHSABS 0.6 (L) 01/16/2020 1357   LYMPHSABS 1.0 09/15/2017 1420   MONOABS 0.5 01/16/2020 1357   EOSABS 0.0 01/16/2020 1357   EOSABS 0.1 09/15/2017 1420   BASOSABS 0.0 01/16/2020 1357   BASOSABS 0.0 09/15/2017 1420    . CMP Latest Ref Rng & Units 01/16/2020 12/26/2019 12/25/2019  Glucose 70 - 99 mg/dL 103(H) 165(H) -  BUN 8 - 23 mg/dL _0 Creatinine 0.61 - 1.24 mg/dL 0.84 0.86 0.6  Sodium 135 - 145 mmol/L 134(L) 140 139  Potassium 3.5 - 5.1 mmol/L 4.2 3.7 3.7  Chloride 98 - 111 mmol/L 100 101 103  CO2 22 - 32 mmol/L 26 30 28(A)  Calcium 8.9 - 10.3 mg/dL 8.9 8.6(L) 8.2(A)  Total Protein 6.5 - 8.1 g/dL 7.3 6.8 -  Total Bilirubin 0.3 - 1.2 mg/dL 0.5 0.7 -  Alkaline Phos 38 - 126 U/L 85 76 75  AST 15 - 41 U/L _1 ALT 0 - 44 U/L 9 13 9(A)     RADIOGRAPHIC STUDIES: I have personally reviewed the radiological images as listed and agreed with the findings in the report. DG Chest Portable 1 View  Result Date: 12/26/2019 CLINICAL DATA:  Pneumonia EXAM: PORTABLE CHEST 1 VIEW COMPARISON:  12/29/2019 FINDINGS: Similar hazy airspace opacity in the right lower lung with likely trace right pleural effusion. No pneumothorax. The left lung is clear. Cardiopericardial silhouette is similar in within normal limits. Calcific atherosclerosis of the aorta. Dual lead cardiac rhythm maintenance device in similar position. Polyarticular degenerative change. IMPRESSION: Similar findings suggestive of right lower lobe pneumonia and likely trace right pleural effusion. Electronically Signed   By: Margaretha Sheffield MD   On: 12/26/2019 13:45   DG Chest Port 1 View  Result Date: 12/21/2019 CLINICAL DATA:  Lethargy, pneumonia diagnosis 1 week ago, productive cough EXAM: PORTABLE CHEST 1 VIEW COMPARISON:  09/10/2019 FINDINGS: Single frontal view of the chest demonstrates right lower lobe consolidation and trace right effusion compatible with known history of pneumonia. No pneumothorax. Left chest is clear. Cardiac silhouette is unremarkable. Dual lead pacemaker is stable. IMPRESSION: 1. Right lower lobe pneumonia with trace right parapneumonic effusion. Electronically Signed   By: Randa Ngo M.D.   On: 12/21/2019 18:00   CUP PACEART REMOTE DEVICE CHECK  Result Date: 01/16/2020 Scheduled remote reviewed. Normal device function.  Presenting: AF w/ VR 150's, AF burden 74%. No OAC noted 130 VT, 397 Fast AV, AF w/ RVR Routing to Triage Next remote 91 days. JM   ASSESSMENT & PLAN:   86 yo with   1) Normocytic normochromic anemia  PLAN: -Discussed patient's most recent labs from 01/10/2020, all values are WNL except for RBC at 2.87, Hgb at 7.8, HCT at 25.4, MCHC at 30.7. -Discussed Folate at 25.4, Ferritin at 902, Vitamin B12 at 585, Iron Panel is as follows: Iron at 17, TIBC at 137, Sat Ratios at 12, UIBC at 120, Retic Ct Pct at 1.5, Retic  Ct Abs at 45.8, Immature Retic Fract at 19.4. -Advised pt that his anemia is concerning because of the rapid drop over the last few months. -Advised pt that nutritional deficiency, blood loss, or a bone marrow disorder could be causing his anemia.  -Advised pt that the normal size of his RBC suggests against nutritional deficiency being the primary cause of his anemia.  -Advised pt that the w/o for his anemia would include labs and potentially a BM Bx based on the labs. -Advised pt that he may require a blood transfusion - he is okay with this.  -Will see back as needed based on labs    FOLLOW UP: Labs today RTC with Dr Irene Limbo as needed based on labs  . Orders  Placed This Encounter  Procedures  . CBC with Differential/Platelet    Standing Status:   Future    Number of Occurrences:   1    Standing Expiration Date:   01/15/2021  . Reticulocytes    Standing Status:   Future    Number of Occurrences:   1    Standing Expiration Date:   01/15/2021  . CMP ([place] only)    Standing Status:   Future    Number of Occurrences:   1    Standing Expiration Date:   01/15/2021  . Lactate dehydrogenase    Standing Status:   Future    Number of Occurrences:   1    Standing Expiration Date:   01/15/2021  . Haptoglobin    Standing Status:   Future    Number of Occurrences:   1    Standing Expiration Date:   01/15/2021  . Multiple Myeloma Panel (SPEP&IFE w/QIG)    Standing Status:   Future    Number of Occurrences:   1    Standing Expiration Date:   01/15/2021  . Kappa/lambda light chains    Standing Status:   Future    Number of Occurrences:   1    Standing Expiration Date:   01/15/2021  . Ferritin    Standing Status:   Future    Number of Occurrences:   1    Standing Expiration Date:   01/15/2021  . Iron and TIBC    Standing Status:   Future    Number of Occurrences:   1    Standing Expiration Date:   01/15/2021  . TSH    Standing Status:   Future    Number of Occurrences:   1    Standing Expiration Date:   01/15/2021     All of the patients questions were answered with apparent satisfaction. The patient knows to call the clinic with any problems, questions or concerns.  I spent 46mns counseling the patient face to face. The total time spent in the appointment was 45 minutes and more than 50% was on counseling and direct patient cares.    GSullivan LoneMD MNorwichAAHIVMS SAmerican Recovery CenterCBlue Hen Surgery CenterHematology/Oncology Physician CScottsdale Eye Institute Plc (Office):       [phone](Work cell):  [phone](Fax):           [phone] 01/16/2020 5:09 PM  I, JYevette Edwards am acting as a scribe for Dr. GSullivan Lone   .I have reviewed the above  documentation for accuracy and completeness, and I agree with the above. .Brunetta GeneraMD

## 2020-01-16 NOTE — Telephone Encounter (Signed)
Patient is now resident of [facility]. Spoke with Sonia Baller RN at the facility and made her aware that remote transmission showed patient was in AF with RVR on 01/15/20 . Patient has had no change in condition over last 48 hrs but is declining in health. Nurse reports patient is being placed on Hospice care and she will notify Dr Lyndel Safe at the facility of patient's AF with RVR . Office # provided.

## 2020-01-17 LAB — KAPPA/LAMBDA LIGHT CHAINS
Kappa free light chain: 89.8 mg/L — ABNORMAL HIGH (ref 3.3–19.4)
Kappa, lambda light chain ratio: 1.44 (ref 0.26–1.65)
Lambda free light chains: 62.3 mg/L — ABNORMAL HIGH (ref 5.7–26.3)

## 2020-01-17 LAB — HAPTOGLOBIN: Haptoglobin: 390 mg/dL — ABNORMAL HIGH (ref 38–329)

## 2020-01-17 NOTE — Telephone Encounter (Signed)
Noted  

## 2020-01-17 NOTE — Telephone Encounter (Signed)
Transmission received and reviewed.  AF w/ RVR noted, Toprol XL restarted 01/16/20 per Dr. Steve Rattler note.  Duplicate phone note routed to Dr. Caryl Comes 01/17/20, no new orders received.  Will reassess on next scheduled remote transmission on 04/16/20.

## 2020-01-17 NOTE — Progress Notes (Signed)
Remote pacemaker transmission.   

## 2020-01-17 NOTE — Telephone Encounter (Signed)
Patient transmission has come through successfully

## 2020-01-18 ENCOUNTER — Other Ambulatory Visit: Payer: Self-pay

## 2020-01-18 LAB — MULTIPLE MYELOMA PANEL, SERUM
Albumin SerPl Elph-Mcnc: 2.3 g/dL — ABNORMAL LOW (ref 2.9–4.4)
Albumin/Glob SerPl: 0.6 — ABNORMAL LOW (ref 0.7–1.7)
Alpha 1: 0.5 g/dL — ABNORMAL HIGH (ref 0.0–0.4)
Alpha2 Glob SerPl Elph-Mcnc: 1 g/dL (ref 0.4–1.0)
B-Globulin SerPl Elph-Mcnc: 1.2 g/dL (ref 0.7–1.3)
Gamma Glob SerPl Elph-Mcnc: 1.6 g/dL (ref 0.4–1.8)
Globulin, Total: 4.3 g/dL — ABNORMAL HIGH (ref 2.2–3.9)
IgA: 654 mg/dL — ABNORMAL HIGH (ref 61–437)
IgG (Immunoglobin G), Serum: 1568 mg/dL (ref 603–1613)
IgM (Immunoglobulin M), Srm: 43 mg/dL (ref 15–143)
Total Protein ELP: 6.6 g/dL (ref 6.0–8.5)

## 2020-01-25 ENCOUNTER — Encounter: Payer: Self-pay | Admitting: Internal Medicine

## 2020-01-25 ENCOUNTER — Non-Acute Institutional Stay (SKILLED_NURSING_FACILITY): Payer: Medicare Other | Admitting: Internal Medicine

## 2020-01-25 DIAGNOSIS — R1312 Dysphagia, oropharyngeal phase: Secondary | ICD-10-CM

## 2020-01-25 DIAGNOSIS — D509 Iron deficiency anemia, unspecified: Secondary | ICD-10-CM | POA: Diagnosis not present

## 2020-01-25 DIAGNOSIS — R634 Abnormal weight loss: Secondary | ICD-10-CM

## 2020-01-25 DIAGNOSIS — J690 Pneumonitis due to inhalation of food and vomit: Secondary | ICD-10-CM

## 2020-01-25 DIAGNOSIS — E039 Hypothyroidism, unspecified: Secondary | ICD-10-CM | POA: Diagnosis not present

## 2020-01-25 DIAGNOSIS — F339 Major depressive disorder, recurrent, unspecified: Secondary | ICD-10-CM | POA: Diagnosis not present

## 2020-01-25 DIAGNOSIS — I480 Paroxysmal atrial fibrillation: Secondary | ICD-10-CM

## 2020-01-25 NOTE — Progress Notes (Signed)
Location:   [place] Room Number: 39-A Place of Service:  SNF [id number]) Provider:  Veleta Miners, MD    Patient Care Team: [name], MD as PCP - General (Internal Medicine) [name], MD (Ophthalmology) [name], MD (Orthopedic Surgery) [name], MD (Cardiology) [name], MD (General Surgery) [name], MD [facility] Medicine)  Extended Emergency Contact Information Primary Emergency Contact: Batson,[name] Address: [place],  [address] [name] [place] Phone: [phone] Mobile Phone: [phone] Relation: Spouse Secondary Emergency Contact: Orbin, Mayeux Mobile Phone: [phone] Relation: Son  Code Status:  DNR/Hospice Goals of care: Advanced Directive information Advanced Directives 01/25/2020  Does Patient Have a Medical Advance Directive? Yes  Type of Paramedic of [place];Living will;Out of facility DNR (pink MOST or yellow form)  Does patient want to make changes to medical advance directive? No - Patient declined  Copy of [place] in Chart? No - copy requested  Would patient like information on creating a medical advance directive? -  Pre-existing out of facility DNR order (yellow form or pink MOST form) Pink MOST/Yellow Form most recent copy in chart - Physician notified to receive inpatient order     Chief Complaint  Patient presents with  . Medical Management of Chronic Issues    Routine Visit.   . Immunizations    Discuss the need for Influenza Vaccine.     HPI:  Pt is a 86 y.o. male seen today for medical management of chronic diseases.    Patient has history ofAcute Right MCA infarct with left Sided weakness from 5/9-5/14.MRI scan showed right subcortical infarct most likely embolic etiology Patient has past medical history of hypertension, hypothyroidism, chronic diastolic CHF, permanent A. fib , traumatic  ICH in January 2021 and Collesfracture of right radius, PPM implant.,History of PMR, hyperlipidemia Also Significant Weight loss of more then 20 lbs Recurent Pneumonia  Dysphagia Continues to be issue Coughing up Mucus. Clear so far. No Fever or Chest Pain A fib Has done well since Toprol Started back on Anemia Saw Hematology They are recommending more work up Continue Iron and Hold Eliquis Has gained weight . Enrolled in Hospice Past Medical History:  Diagnosis Date  . AF (atrial fibrillation) [facility])    a. s/p RFCA/PVI @ [facility] clinic 2007.  . Arthritis    "joints" (06/21/2013)  . Atrial flutter ([place])    a. 06/2013 s/p TEE/DCCV  . Chronic systolic CHF (congestive heart failure) ([place])    a. 06/2013 Echo: EF 20%, diff HK, mild LVH.  Marland Kitchen History of cardiovascular stress test    Lexiscan Myoview 8/16:  EF 59%, attenuation artifact, no ischemia; Low Risk  . Hyperlipidemia   . Hypertension   . HYPERTENSION, BENIGN 08/21/2009   Qualifier: Diagnosis of  By: Caryl Comes, MD, Remus Blake  Medications(s)  enalapril   . Hypothyroidism   . Neoplasm of uncertain behavior    SKIN  . Polymyalgia rheumatica ([place]) 01/13/2007   Qualifier: History of  By: Danny Lawless CMA, [place]     . Presence of permanent cardiac pacemaker    Past Surgical History:  Procedure Laterality Date  . ATRIAL FIBRILLATION ABLATION  2007  . CARDIOVERSION N/A 06/22/2013   Procedure: CARDIOVERSION;  Surgeon: Sueanne Margarita, MD;  [facility]: [facility] ENDOSCOPY;  Service: Cardiovascular;  Laterality: N/A;  15:17 Lido 20mg ,IV , Propofol 30 mg, IV synched cardioversion @  150 joules by Dr. Lajoyce Lauber from a flutter to sinis brady which is baseline for this pt, reports 45-50 reg HR verified by Dr. Ashok Norris  . CARDIOVERSION N/A 11/21/2014   Procedure: CARDIOVERSION;  Surgeon: Fay Records, MD;  Location: [facility] ENDOSCOPY;  Service: Cardiovascular;  Laterality: N/A;  . CARDIOVERSION N/A 07/14/2015   Procedure: CARDIOVERSION;  Surgeon: Sueanne Margarita, MD;  Location: [place];  Service: Cardiovascular;  Laterality: N/A;  . CATARACT EXTRACTION, BILATERAL Bilateral Summer 2009  . CHOLECYSTECTOMY    . EP IMPLANTABLE DEVICE N/A 11/10/2015   Procedure: Pacemaker Implant;  Surgeon: [name], MD;  Location: [place] CV LAB;  Service: Cardiovascular;  Laterality: N/A;  . HAND SURGERY Right    "broke bone; grafted area w/bone from somewhere else"  . HEMORRHOID SURGERY    . INGUINAL HERNIA REPAIR Bilateral   . TEE WITHOUT CARDIOVERSION N/A 06/22/2013   Procedure: TRANSESOPHAGEAL ECHOCARDIOGRAM (TEE);  Surgeon: Sueanne Margarita, MD;  Location: [facility] ENDOSCOPY;  Service: Cardiovascular;  Laterality: N/A;  . TEE WITHOUT CARDIOVERSION N/A 07/14/2015   Procedure: TRANSESOPHAGEAL ECHOCARDIOGRAM (TEE);  Surgeon: Sueanne Margarita, MD;  Location: [facility] ENDOSCOPY;  Service: Cardiovascular;  Laterality: N/A;  . TONSILLECTOMY      No Known Allergies  Allergies as of 01/25/2020   No Known Allergies     Medication List       Accurate as of January 25, 2020 10:10 AM. If you have any questions, ask your nurse or doctor.        STOP taking these medications   glucosamine-chondroitin 500-400 MG tablet Stopped by: [name], MD     TAKE these medications   antiseptic oral rinse Liqd 20 mLs by Mouth Rinse route as needed for dry mouth.   atorvastatin 40 MG tablet Commonly known as: LIPITOR Take 40 mg by mouth daily.   carboxymethylcellulose 0.5 % Soln Commonly known as: REFRESH PLUS 1 drop every 4 (four) hours as needed. Each eye   docusate sodium 100 MG capsule Commonly known as: COLACE Take 200 mg by mouth daily.   ferrous sulfate 325 (65 FE) MG EC tablet Take 325 mg by mouth daily.   levothyroxine 50 MCG tablet Commonly known as: SYNTHROID Take 1 tablet (50 mcg total) by mouth daily before breakfast.   metoprolol succinate 25 MG 24 hr tablet Commonly known as: TOPROL-XL Take 12.5 mg by mouth daily.   mirtazapine 15  MG tablet Commonly known as: REMERON Take 15 mg by mouth at bedtime.   multivitamin with minerals Tabs tablet Take 1 tablet by mouth daily. Centrum   polyethylene glycol 17 g packet Commonly known as: MIRALAX / GLYCOLAX Take 17 g by mouth daily. 17 gram/dose, oral, Once A Morning, Miralax 17 gm PO QD. Hold for diarrhea.   RESOURCE 2.0 PO Take by mouth in the morning, at noon, and at bedtime.   sennosides-docusate sodium 8.6-50 MG tablet Commonly known as: SENOKOT-S Take 2 tablets by mouth daily.   zinc oxide 20 % ointment Apply 1 application topically as needed for irritation.       Review of Systems  Constitutional: Positive for activity change and unexpected weight change.  HENT: Positive for congestion.   Respiratory: Positive for cough, choking and shortness of breath.   Cardiovascular: Negative.   Gastrointestinal: Negative.   Genitourinary: Negative.   Musculoskeletal: Positive for gait problem.  Skin: Negative.   Neurological: Positive for weakness.  Psychiatric/Behavioral: Negative.     Immunization History  Administered Date(s) Administered  . Influenza Split 01/25/2012  . Influenza Whole 02/08/2008, 03/12/2009, 01/16/2010  . Influenza, High Dose Seasonal PF 01/24/2015, 03/04/2017, 02/16/2018, 12/29/2018  . Influenza,inj,Quad PF,6+ Mos 01/25/2013, 01/23/2014  . Influenza-Unspecified 01/02/2016  . PFIZER SARS-COV-2 Vaccination 05/22/2019, 06/12/2019  . Pneumococcal Conjugate-13 01/25/2013  . Pneumococcal Polysaccharide-23 05/28/2016  . Td 01/16/2010  . Tdap 05/06/2019   Pertinent  Health Maintenance Due  Topic Date Due  . INFLUENZA VACCINE  12/02/2019  . PNA vac Low Risk Adult  Completed   Fall Risk  03/23/2019 03/17/2018 02/16/2018 05/28/2016 04/08/2016  Falls in the past year? 0 1 No No No  Comment [employer] Telephone Survey: data to providers prior to load [employer] Telephone Survey: data to providers prior to load - - [employer] Telephone Survey: data to providers  prior to load  Number falls in past yr: - 1 - - -  Comment - [employer] Telephone Survey Actual Response = 2 - - -  Injury with Fall? - 1 - - -   Functional Status Survey:    Vitals:   01/25/20 1003  BP: 113/72  Pulse: 94  Resp: 16  Temp: (!) 97.2 F (36.2 C)  SpO2: 93%  Weight: 126 lb 6.4 oz (57.3 kg)  Height: 5\' 9"  (1.753 m)   Body mass index is 18.67 kg/m. Physical Exam Vitals reviewed.  Constitutional:      Appearance: Normal appearance.  HENT:     Head: Normocephalic.     Nose: Nose normal.     Mouth/Throat:     Mouth: Mucous membranes are moist.     Pharynx: Oropharynx is clear.  Eyes:     Pupils: Pupils are equal, round, and reactive to light.  Cardiovascular:     Rate and Rhythm: Normal rate. Rhythm irregular.     Pulses: Normal pulses.  Pulmonary:     Effort: Pulmonary effort is normal.     Breath sounds: Rales present.  Abdominal:     General: Abdomen is flat. Bowel sounds are normal.     Palpations: Abdomen is soft.  Musculoskeletal:        General: No swelling.     Cervical back: Neck supple.  Skin:    General: Skin is warm.  Neurological:     Mental Status: He is alert.     Comments: Left hemiparesis  Psychiatric:        Mood and Affect: Mood normal.        Thought Content: Thought content normal.     Labs reviewed: Recent Labs    09/09/19 1138 09/10/19 0621 12/21/19 1741 12/21/19 1741 12/25/19 2346 12/26/19 1318 01/16/20 1357  NA 141   < > 138  --  139 140 134*  K 4.5   < > 4.0   < > 3.7 3.7 4.2  CL 104   < > 102   < > 103 101 100  CO2 28   < > 27   < > 28* 30 26  GLUCOSE 104*   < > 104*  --   --  165* 103*  BUN 16   < > 17  --  18 20 14   CREATININE 0.98   < > 0.68  --  0.6 0.86 0.84  CALCIUM 9.3   < > 8.5*   < > 8.2* 8.6* 8.9  MG 2.0  --   --   --   --   --   --    < > = values in this  interval not displayed.   Recent Labs    12/21/19 1741 12/21/19 1741 12/25/19 2346 12/26/19 1318 01/16/20 1357  AST 31   < > 23 31 31   ALT  13   < > 9* 13 9  ALKPHOS 75   < > 75 76 85  BILITOT 0.7  --   --  0.7 0.5  PROT 7.3  --   --  6.8 7.3  ALBUMIN 2.5*   < > 2.4* 2.3* 2.1*   < > = values in this interval not displayed.   Recent Labs    12/20/19 0011 12/21/19 1741 12/21/19 1741 12/25/19 2346 12/25/19 2346 12/26/19 1318 01/03/20 0000 01/10/20 0000 01/16/20 1357  WBC   < > 7.3   < > 6.9   < > 7.0 5.9 6.2 6.7  NEUTROABS   < > 6.2  --  5,886  --  6.0  --   --  5.5  HGB   < > 8.4*   < > 7.4*   < > 8.5* 7.8* 8.1* 8.6*  HCT   < > 27.5*   < > 23*   < > 28.0* 25* 26* 28.4*  MCV  --  93.2  --   --   --  92.1  --   --  87.7  PLT   < > 218   < > 242   < > 259 276 215 259   < > = values in this interval not displayed.   Lab Results  Component Value Date   TSH 3.987 01/16/2020   Lab Results  Component Value Date   HGBA1C 5.5 09/10/2019   Lab Results  Component Value Date   CHOL 133 09/10/2019   HDL 41 09/10/2019   LDLCALC 77 09/10/2019   TRIG 73 09/10/2019   CHOLHDL 3.2 09/10/2019    Significant Diagnostic Results in last 30 days:  DG Chest Portable 1 View  Result Date: 12/26/2019 CLINICAL DATA:  Pneumonia EXAM: PORTABLE CHEST 1 VIEW COMPARISON:  12/29/2019 FINDINGS: Similar hazy airspace opacity in the right lower lung with likely trace right pleural effusion. No pneumothorax. The left lung is clear. Cardiopericardial silhouette is similar in within normal limits. Calcific atherosclerosis of the aorta. Dual lead cardiac rhythm maintenance device in similar position. Polyarticular degenerative change. IMPRESSION: Similar findings suggestive of right lower lobe pneumonia and likely trace right pleural effusion. Electronically Signed   By: Margaretha Sheffield MD   On: 12/26/2019 13:45   CUP PACEART REMOTE DEVICE CHECK  Result Date: 01/16/2020 Scheduled remote reviewed. Normal device function.  Presenting: AF w/ VR 150's, AF burden 74%. No OAC noted 130 VT, 397 Fast AV, AF w/ RVR Routing to Triage Next remote 91 days.  JM   Assessment/Plan Oropharyngeal dysphagia Will try Duo Nebs and Mucinex and see if it helps his symptoms Has refused Modified diet  Recurrent aspiration pneumonia ([place]) Will hold Chest Xray unless he gets fever or SOB  PAF (paroxysmal atrial fibrillation) (HCC) Rate Controlled with Toprol XL Iron deficiency anemia, unspecified iron deficiency anemia type Continue on Iron Occult Negative Getting work up with Hematology Weight loss Is gaining weight since his Diet is liberalized Hypothyroidism, unspecified type TSH Normal in 9/21 Depression, recurrent ([place]) Continue on Remeron  S/P CVA with Left Hemiparesis and Dysphagia Thought to be due to his A Fib Off Eliquis due to his Anemia On Statin  Family/ staff Communication:   Labs/tests ordered:

## 2020-01-28 ENCOUNTER — Encounter: Payer: Self-pay | Admitting: Internal Medicine

## 2020-01-28 ENCOUNTER — Non-Acute Institutional Stay (SKILLED_NURSING_FACILITY): Payer: Medicare Other | Admitting: Internal Medicine

## 2020-01-28 DIAGNOSIS — E039 Hypothyroidism, unspecified: Secondary | ICD-10-CM | POA: Diagnosis not present

## 2020-01-28 DIAGNOSIS — J690 Pneumonitis due to inhalation of food and vomit: Secondary | ICD-10-CM | POA: Diagnosis not present

## 2020-01-28 DIAGNOSIS — I480 Paroxysmal atrial fibrillation: Secondary | ICD-10-CM

## 2020-01-28 DIAGNOSIS — R634 Abnormal weight loss: Secondary | ICD-10-CM | POA: Diagnosis not present

## 2020-01-28 DIAGNOSIS — F339 Major depressive disorder, recurrent, unspecified: Secondary | ICD-10-CM

## 2020-01-28 DIAGNOSIS — D509 Iron deficiency anemia, unspecified: Secondary | ICD-10-CM

## 2020-01-28 DIAGNOSIS — R1312 Dysphagia, oropharyngeal phase: Secondary | ICD-10-CM

## 2020-01-28 DIAGNOSIS — R05 Cough: Secondary | ICD-10-CM | POA: Diagnosis not present

## 2020-01-28 MED ORDER — LORAZEPAM 0.5 MG PO TABS: 0.5000 mg | ORAL_TABLET | ORAL | 0 refills | Status: DC | PRN

## 2020-01-28 MED ORDER — MORPHINE SULFATE (CONCENTRATE) 20 MG/ML PO SOLN
5.0000 mg | ORAL | 0 refills | Status: DC | PRN
Start: 2020-01-28 — End: 2020-01-28

## 2020-01-28 NOTE — Progress Notes (Signed)
Location: [place] Room Number: 88 Place of Service:  SNF [phone])  Provider: Veleta Miners MD  Code Status: DNR Goals of Care:  Advanced Directives 01/25/2020  Does Patient Have a Medical Advance Directive? Yes  Type of Paramedic of [place];Living will;Out of facility DNR (pink MOST or yellow form)  Does patient want to make changes to medical advance directive? No - Patient declined  Copy of [place] in Chart? No - copy requested  Would patient like information on creating a medical advance directive? -  Pre-existing out of facility DNR order (yellow form or pink MOST form) Pink MOST/Yellow Form most recent copy in chart - Physician notified to receive inpatient order     Chief Complaint  Patient presents with  . Acute Visit    Shortness of breath and cough    HPI: Patient is a 86 y.o. male seen today for an acute visit for SOB and Cough  Patient has history ofAcute Right MCA infarct with left Sided weakness from 5/9-5/14.MRI scan showed right subcortical infarct most likely embolic etiology Patient has past medical history of hypertension, hypothyroidism, chronic diastolic CHF, permanent A. fib , traumatic ICH in January 2021 and Collesfracture of right radius, PPM implant.,History of PMR, hyperlipidemia Also Significant Weight loss of more then 20 lbs, Iron Def Anemia Recurent Pneumonia  He is Enrolled in Hospice Since yesterday patient has been more drowsy coughing green sputum and short of breath. Unable to give me much history today. His wife was there and she wants me to make him comfortable and not do any aggressive therapy. A chest x-ray was ordered and is pending. He does not have any fever or chest pain  Past Medical History:  Diagnosis Date  . AF (atrial fibrillation) [facility])    a. s/p RFCA/PVI @ [facility] clinic 2007.  . Arthritis    "joints" (06/21/2013)  . Atrial flutter ([place])    a. 06/2013  s/p TEE/DCCV  . Chronic systolic CHF (congestive heart failure) ([place])    a. 06/2013 Echo: EF 20%, diff HK, mild LVH.  Marland Kitchen History of cardiovascular stress test    Lexiscan Myoview 8/16:  EF 59%, attenuation artifact, no ischemia; Low Risk  . Hyperlipidemia   . Hypertension   . HYPERTENSION, BENIGN 08/21/2009   Qualifier: Diagnosis of  By: Caryl Comes, MD, Remus Blake  Medications(s)  enalapril   . Hypothyroidism   . Neoplasm of uncertain behavior    SKIN  . Polymyalgia rheumatica ([place]) 01/13/2007   Qualifier: History of  By: Danny Lawless CMA, [place]     . Presence of permanent cardiac pacemaker     Past Surgical History:  Procedure Laterality Date  . ATRIAL FIBRILLATION ABLATION  2007  . CARDIOVERSION N/A 06/22/2013   Procedure: CARDIOVERSION;  Surgeon: Sueanne Margarita, MD;  [facility]: [facility] ENDOSCOPY;  Service: Cardiovascular;  Laterality: N/A;  15:17 Lido 20mg ,IV , Propofol 30 mg, IV synched cardioversion @ 150 joules by Dr. Lajoyce Lauber from a flutter to sinis brady which is baseline for this pt, reports 45-50 reg HR verified by Dr. Ashok Norris  . CARDIOVERSION N/A 11/21/2014   Procedure: CARDIOVERSION;  Surgeon: Fay Records, MD;  Location: [facility] ENDOSCOPY;  Service: Cardiovascular;  Laterality: N/A;  . CARDIOVERSION N/A 07/14/2015   Procedure: CARDIOVERSION;  Surgeon: Sueanne Margarita, MD;  Location: [place];  Service: Cardiovascular;  Laterality: N/A;  . CATARACT EXTRACTION, BILATERAL Bilateral Summer 2009  . CHOLECYSTECTOMY    .  EP IMPLANTABLE DEVICE N/A 11/10/2015   Procedure: Pacemaker Implant;  Surgeon: [name], MD;  Location: [place] CV LAB;  Service: Cardiovascular;  Laterality: N/A;  . HAND SURGERY Right    "broke bone; grafted area w/bone from somewhere else"  . HEMORRHOID SURGERY    . INGUINAL HERNIA REPAIR Bilateral   . TEE WITHOUT CARDIOVERSION N/A 06/22/2013   Procedure: TRANSESOPHAGEAL ECHOCARDIOGRAM (TEE);  Surgeon: Sueanne Margarita, MD;  Location: [facility] ENDOSCOPY;  Service:  Cardiovascular;  Laterality: N/A;  . TEE WITHOUT CARDIOVERSION N/A 07/14/2015   Procedure: TRANSESOPHAGEAL ECHOCARDIOGRAM (TEE);  Surgeon: Sueanne Margarita, MD;  Location: [facility] ENDOSCOPY;  Service: Cardiovascular;  Laterality: N/A;  . TONSILLECTOMY      No Known Allergies  Outpatient Encounter Medications as of 01/28/2020  Medication Sig  . antiseptic oral rinse (BIOTENE) LIQD 20 mLs by Mouth Rinse route as needed for dry mouth.   Marland Kitchen atorvastatin (LIPITOR) 40 MG tablet Take 40 mg by mouth daily.  . carboxymethylcellulose (REFRESH PLUS) 0.5 % SOLN 1 drop every 4 (four) hours as needed. Each eye  . docusate sodium (COLACE) 100 MG capsule Take 200 mg by mouth daily.  . ferrous sulfate 325 (65 FE) MG EC tablet Take 325 mg by mouth daily.  Marland Kitchen guaiFENesin (MUCINEX FAST-MAX CHEST CONG MS) 100 MG/5ML liquid Take 200 mg by mouth 2 (two) times daily as needed for cough.  Marland Kitchen ipratropium-albuterol (DUONEB) 0.5-2.5 (3) MG/3ML SOLN Take 3 mLs by nebulization 2 (two) times daily.  Marland Kitchen levothyroxine (SYNTHROID) 50 MCG tablet Take 1 tablet (50 mcg total) by mouth daily before breakfast.  . metoprolol succinate (TOPROL-XL) 25 MG 24 hr tablet Take 12.5 mg by mouth daily.  . mirtazapine (REMERON) 15 MG tablet Take 15 mg by mouth at bedtime.   . Multiple Vitamin (MULTIVITAMIN WITH MINERALS) TABS tablet Take 1 tablet by mouth daily. Centrum  . Nutritional Supplements (RESOURCE 2.0 PO) Take by mouth in the morning, at noon, and at bedtime.  . polyethylene glycol (MIRALAX / GLYCOLAX) 17 g packet Take 17 g by mouth daily. 17 gram/dose, oral, Once A Morning, Miralax 17 gm PO QD. Hold for diarrhea.  . sennosides-docusate sodium (SENOKOT-S) 8.6-50 MG tablet Take 2 tablets by mouth daily.  . [DISCONTINUED] LORazepam (ATIVAN) 0.5 MG tablet Take 1 tablet (0.5 mg total) by mouth every 4 (four) hours as needed for anxiety.  . [DISCONTINUED] morphine (ROXANOL) 20 MG/ML concentrated solution Take 0.25 mLs (5 mg total) by mouth every 4  (four) hours as needed for severe pain.  . [DISCONTINUED] zinc oxide 20 % ointment Apply 1 application topically as needed for irritation.   No facility-administered encounter medications on file as of 01/28/2020.    Review of Systems:  Review of Systems  Unable to perform ROS: Other    Health Maintenance  Topic Date Due  . INFLUENZA VACCINE  12/02/2019  . TETANUS/TDAP  05/05/2029  . COVID-19 Vaccine  Completed  . PNA vac Low Risk Adult  Completed    Physical Exam: Vitals:   01/28/20 1243  BP: 118/74  Pulse: 70  Resp: 18  Temp: 97.9 F (36.6 C)  SpO2: 95%  Weight: 126 lb 6.4 oz (57.3 kg)  Height: 5\' 9"  (1.753 m)   Body mass index is 18.67 kg/m. Physical Exam Vitals reviewed.  Constitutional:      Comments: Drowzy and weak  HENT:     Head: Normocephalic.     Nose: Nose normal.     Mouth/Throat:  Mouth: Mucous membranes are dry.  Eyes:     Pupils: Pupils are equal, round, and reactive to light.  Cardiovascular:     Rate and Rhythm: Normal rate. Rhythm irregular.     Pulses: Normal pulses.  Pulmonary:     Effort: Pulmonary effort is normal.     Breath sounds: Rales present.  Abdominal:     General: Abdomen is flat. Bowel sounds are normal.     Palpations: Abdomen is soft.  Musculoskeletal:        General: No swelling.     Cervical back: Neck supple.  Skin:    General: Skin is warm.  Neurological:     Comments: Drowsy today  Psychiatric:        Mood and Affect: Mood normal.     Labs reviewed: Basic Metabolic Panel: Recent Labs    09/09/19 1138 09/10/19 0621 09/12/19 1326 09/13/19 0454 09/14/19 0301 12/20/19 0011 12/21/19 1741 12/21/19 1741 12/25/19 2346 12/26/19 1318 01/16/20 1357  NA 141   < >  --    < >   < > 139 138  --  139 140 134*  K 4.5   < >  --    < >   < > 4.0 4.0   < > 3.7 3.7 4.2  CL 104   < >  --    < >   < > 103 102   < > 103 101 100  CO2 28   < >  --    < >   < > 27* 27   < > 28* 30 26  GLUCOSE 104*   < >  --    < >   <  >  --  104*  --   --  165* 103*  BUN 16   < >  --    < >   < > 16 17  --  18 20 14   CREATININE 0.98   < >  --    < >   < > 0.6 0.68  --  0.6 0.86 0.84  CALCIUM 9.3   < >  --    < >   < > 8.2* 8.5*   < > 8.2* 8.6* 8.9  MG 2.0  --   --   --   --   --   --   --   --   --   --   TSH 3.235  --  3.939  --   --  2.50  --   --   --   --  3.987   < > = values in this interval not displayed.   Liver Function Tests: Recent Labs    12/21/19 1741 12/21/19 1741 12/25/19 2346 12/26/19 1318 01/16/20 1357  AST 31   < > 23 31 31   ALT 13   < > 9* 13 9  ALKPHOS 75   < > 75 76 85  BILITOT 0.7  --   --  0.7 0.5  PROT 7.3  --   --  6.8 7.3  ALBUMIN 2.5*   < > 2.4* 2.3* 2.1*   < > = values in this interval not displayed.   No results for input(s): LIPASE, AMYLASE in the last 8760 hours. No results for input(s): AMMONIA in the last 8760 hours. CBC: Recent Labs    12/20/19 0011 12/21/19 1741 12/21/19 1741 12/25/19 2346 12/25/19 2346 12/26/19 1318 01/03/20 0000 01/10/20 0000 01/16/20 1357  WBC   < >  7.3   < > 6.9   < > 7.0 5.9 6.2 6.7  NEUTROABS   < > 6.2  --  5,886  --  6.0  --   --  5.5  HGB   < > 8.4*   < > 7.4*   < > 8.5* 7.8* 8.1* 8.6*  HCT   < > 27.5*   < > 23*   < > 28.0* 25* 26* 28.4*  MCV  --  93.2  --   --   --  92.1  --   --  87.7  PLT   < > 218   < > 242   < > 259 276 215 259   < > = values in this interval not displayed.   Lipid Panel: Recent Labs    09/10/19 0621  CHOL 133  HDL 41  LDLCALC 77  TRIG 73  CHOLHDL 3.2   Lab Results  Component Value Date   HGBA1C 5.5 09/10/2019    Procedures since last visit: [place]  Result Date: 01/16/2020 Scheduled remote reviewed. Normal device function.  Presenting: AF w/ VR 150's, AF burden 74%. No OAC noted 130 VT, 397 Fast AV, AF w/ RVR Routing to Triage Next remote 91 days. JM   Assessment/Plan Aspiration Pneumonia Chest x-ray came back and it was positive for right lower lobe mid lobe  infiltrate With started on doxycycline twice daily Patient refusing his other meds Meds Will start on Oxygen, Continue nebs Also On Roxanol and Ativan Hospice is involved Discontinue Remeron,Atrovastatin and Iron PAF We'll continue on the Toprol for now  Labs/tests ordered:  * No order type specified * Next appt:  Visit date not found

## 2020-02-01 DEATH — deceased

## 2020-03-26 ENCOUNTER — Ambulatory Visit: Payer: Medicare Other | Admitting: Adult Health

## 2021-12-11 IMAGING — CR DG ANKLE COMPLETE 3+V*L*
3 series · 3 of 3 positions shown · non-contrast
Comparison: None.

CLINICAL DATA: Left ankle pain after fall

EXAM:
LEFT ANKLE COMPLETE - 3+ VIEW

[ankle ap]
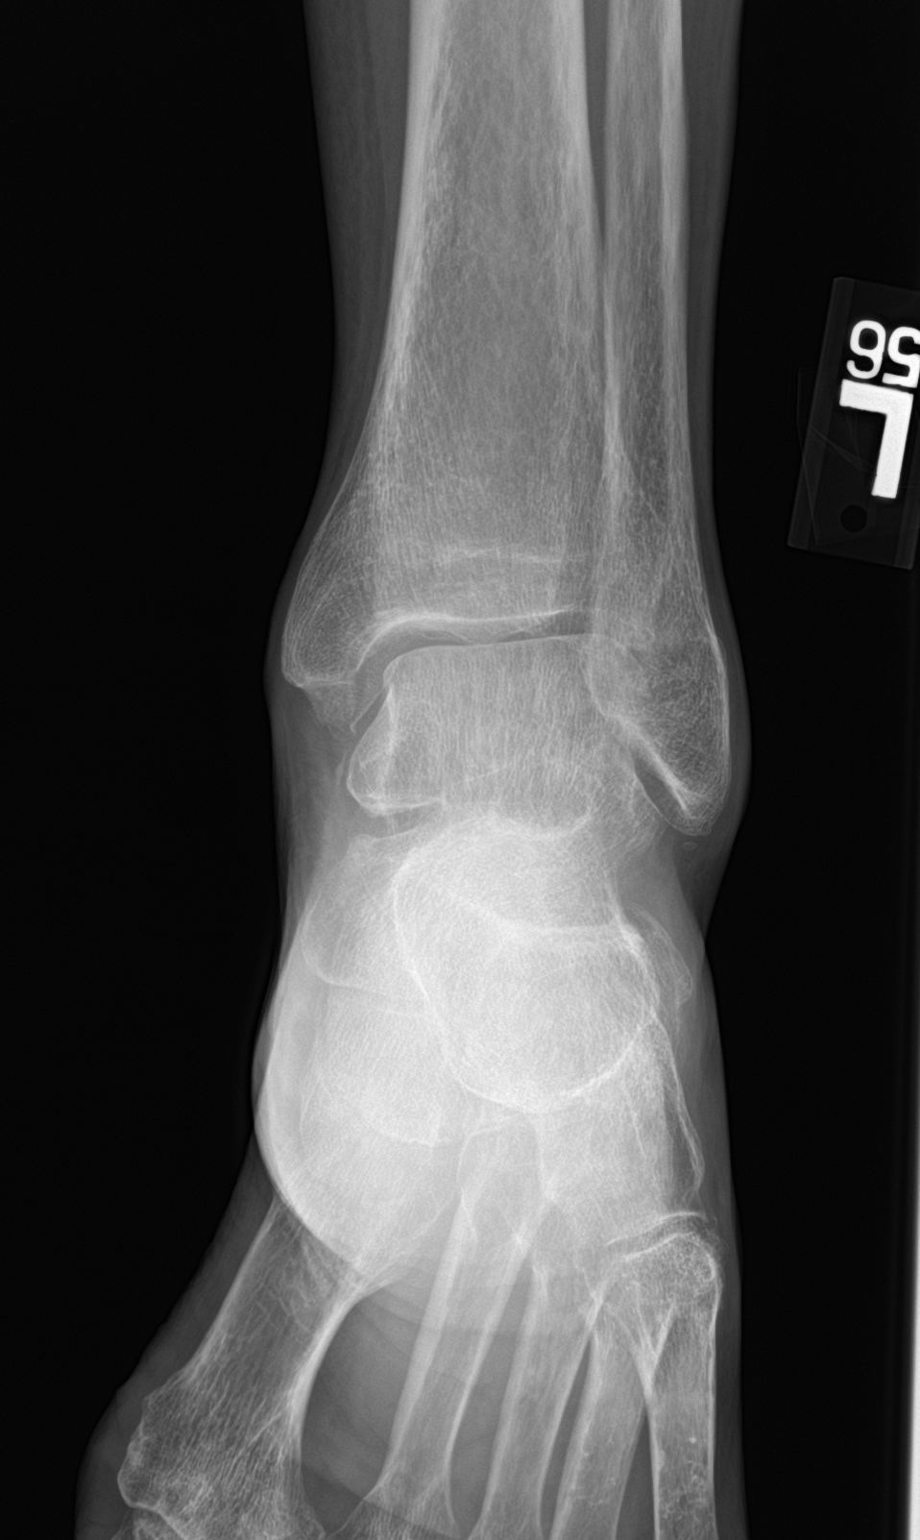

[ankle obl]
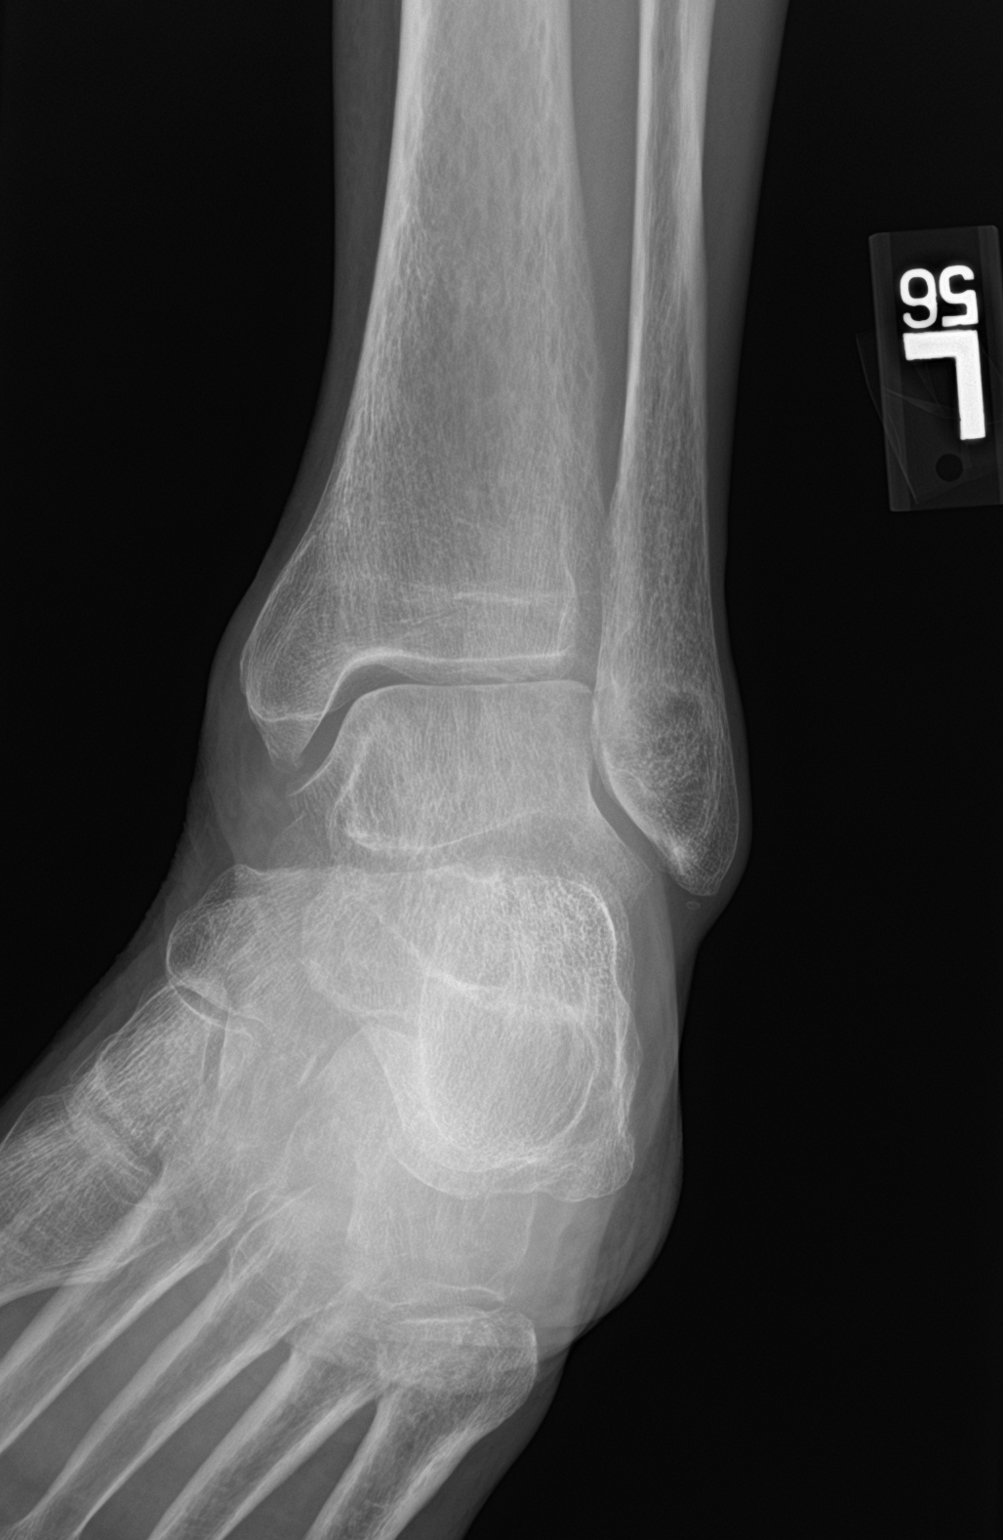

[ankle lat]
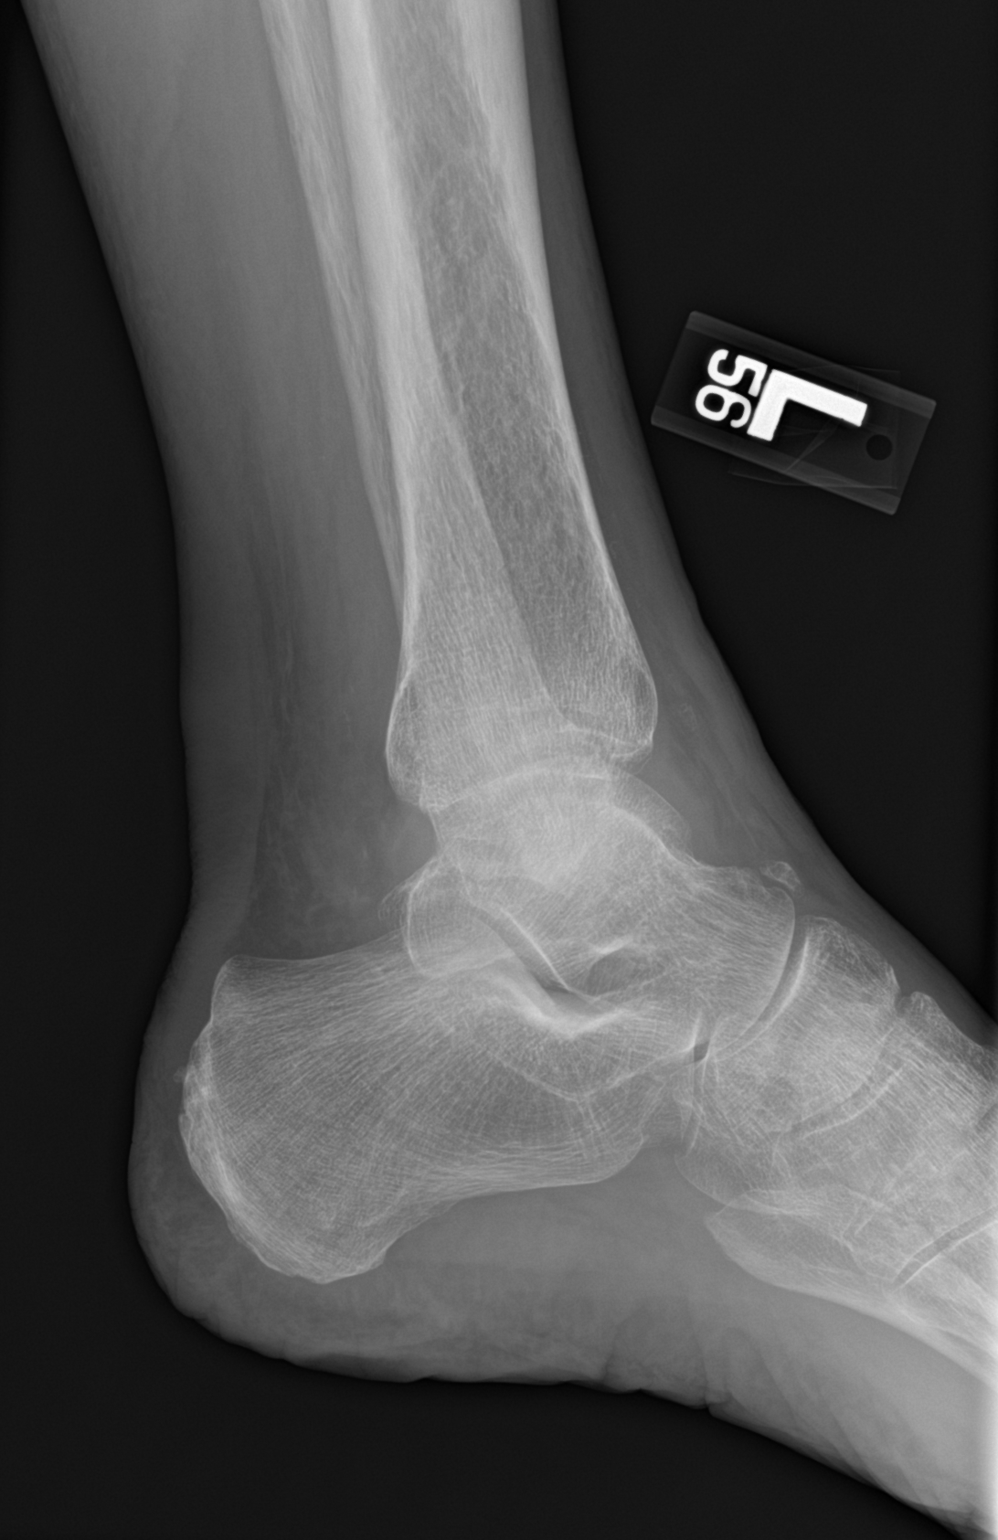

[3 of 3 positions shown; findings below may reference images not displayed]

FINDINGS: There is no evidence of acute fracture or dislocation. Well
corticated osseous density dorsal to the talar head suggesting
sequela of remote trauma. Mild degenerative changes. Soft tissues
are unremarkable.
IMPRESSION: No acute osseous abnormality, left ankle.

## 2021-12-11 IMAGING — CR DG PELVIS 1-2V
2 series · 2 of 2 positions shown · non-contrast
Comparison: None.

CLINICAL DATA: Fall with pain.

EXAM:
PELVIS - 1-2 VIEW

[pelvis ap (1 of 2)]
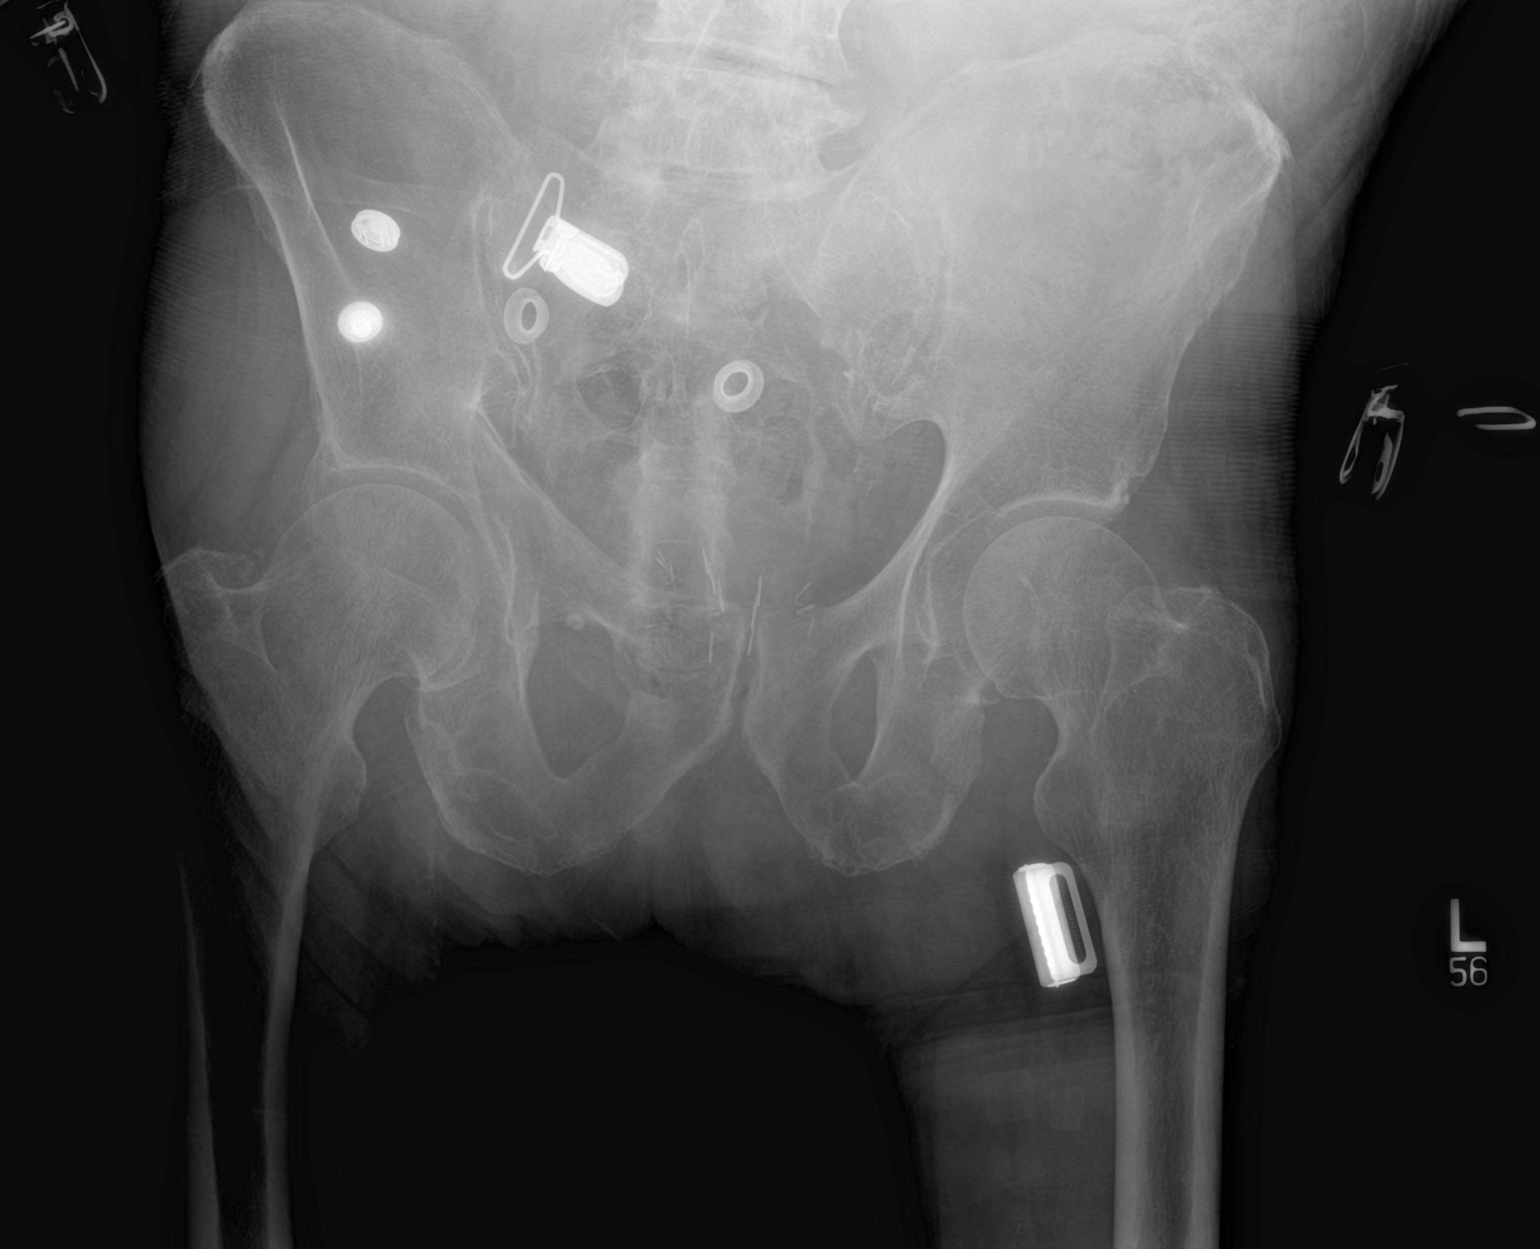

[pelvis ap (2 of 2)]
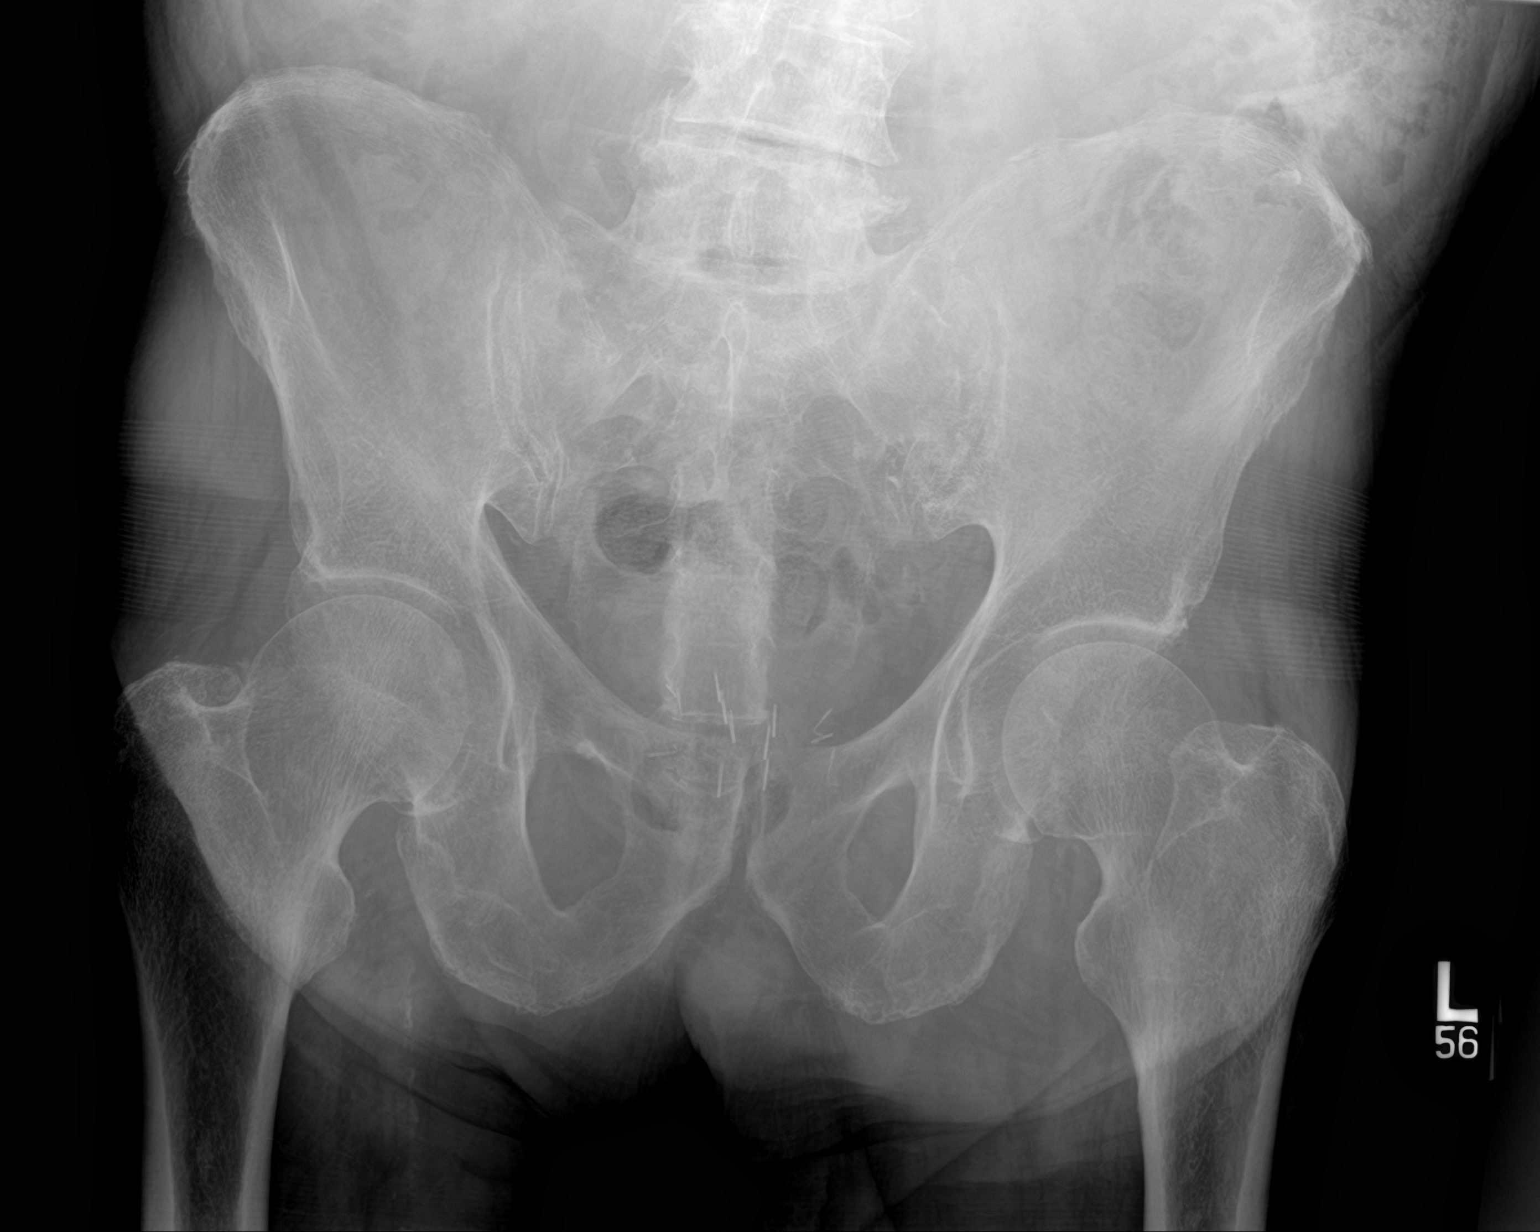

[2 of 2 positions shown; findings below may reference images not displayed]

FINDINGS: Two AP views of the pelvis. Femoral heads are located. No acute
fracture. Radiation seeds in the prostate. Sacroiliac joints are
symmetric. Degenerate disc disease involves L4-5 and L5-S1.
IMPRESSION: No acute osseous abnormality.
